# Patient Record
Sex: Female | Born: 1945
Health system: Southern US, Community
[De-identification: ages and names within clinical notes are randomized; demographics above are authoritative.]

## PROBLEM LIST (undated history)

## (undated) DIAGNOSIS — I82409 Acute embolism and thrombosis of unspecified deep veins of unspecified lower extremity: Secondary | ICD-10-CM

## (undated) DIAGNOSIS — R87619 Unspecified abnormal cytological findings in specimens from cervix uteri: Secondary | ICD-10-CM

## (undated) DIAGNOSIS — I739 Peripheral vascular disease, unspecified: Secondary | ICD-10-CM

## (undated) DIAGNOSIS — E785 Hyperlipidemia, unspecified: Secondary | ICD-10-CM

## (undated) DIAGNOSIS — I2510 Atherosclerotic heart disease of native coronary artery without angina pectoris: Secondary | ICD-10-CM

## (undated) DIAGNOSIS — K219 Gastro-esophageal reflux disease without esophagitis: Secondary | ICD-10-CM

## (undated) DIAGNOSIS — D351 Benign neoplasm of parathyroid gland: Secondary | ICD-10-CM

## (undated) DIAGNOSIS — I10 Essential (primary) hypertension: Secondary | ICD-10-CM

## (undated) DIAGNOSIS — H3530 Unspecified macular degeneration: Secondary | ICD-10-CM

## (undated) DIAGNOSIS — M51369 Other intervertebral disc degeneration, lumbar region without mention of lumbar back pain or lower extremity pain: Secondary | ICD-10-CM

## (undated) DIAGNOSIS — M5136 Other intervertebral disc degeneration, lumbar region: Secondary | ICD-10-CM

## (undated) HISTORY — DX: Atherosclerotic heart disease of native coronary artery without angina pectoris: I25.10

## (undated) HISTORY — DX: Benign neoplasm of parathyroid gland: D35.1

## (undated) HISTORY — DX: Other intervertebral disc degeneration, lumbar region without mention of lumbar back pain or lower extremity pain: M51.369

## (undated) HISTORY — DX: Unspecified abnormal cytological findings in specimens from cervix uteri: R87.619

## (undated) HISTORY — PX: CATARACT EXTRACTION: SUR2

## (undated) HISTORY — DX: Unspecified macular degeneration: H35.30

## (undated) HISTORY — PX: FOOT SURGERY: SHX648

## (undated) HISTORY — PX: COLPOSCOPY: SHX161

## (undated) HISTORY — PX: COLONOSCOPY: SHX174

## (undated) HISTORY — DX: Other intervertebral disc degeneration, lumbar region: M51.36

## (undated) HISTORY — DX: Essential (primary) hypertension: I10

## (undated) HISTORY — PX: PARATHYROIDECTOMY: SHX19

## (undated) HISTORY — PX: TUBAL LIGATION: SHX77

## (undated) HISTORY — DX: Hyperlipidemia, unspecified: E78.5

## (undated) HISTORY — DX: Peripheral vascular disease, unspecified: I73.9

---

## 2006-12-03 ENCOUNTER — Ambulatory Visit: Payer: Self-pay | Admitting: Podiatry

## 2006-12-15 ENCOUNTER — Ambulatory Visit: Payer: Self-pay | Admitting: Podiatry

## 2008-11-12 ENCOUNTER — Encounter: Admission: RE | Admit: 2008-11-12 | Discharge: 2008-11-12 | Payer: Self-pay | Admitting: Orthopedic Surgery

## 2008-11-26 ENCOUNTER — Encounter: Admission: RE | Admit: 2008-11-26 | Discharge: 2008-11-26 | Payer: Self-pay | Admitting: Orthopedic Surgery

## 2008-12-23 ENCOUNTER — Emergency Department: Payer: Self-pay | Admitting: Internal Medicine
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
## 2009-04-20 DIAGNOSIS — I219 Acute myocardial infarction, unspecified: Secondary | ICD-10-CM

## 2009-04-20 DIAGNOSIS — I2510 Atherosclerotic heart disease of native coronary artery without angina pectoris: Secondary | ICD-10-CM

## 2009-04-20 HISTORY — DX: Acute myocardial infarction, unspecified: I21.9

## 2009-04-20 HISTORY — DX: Atherosclerotic heart disease of native coronary artery without angina pectoris: I25.10

## 2009-04-20 HISTORY — PX: ILIO-FEMORAL BYPASS GRAFT: SHX989

## 2009-07-15 ENCOUNTER — Encounter: Payer: Self-pay | Admitting: Cardiovascular Disease

## 2009-07-22 ENCOUNTER — Encounter: Payer: Self-pay | Admitting: Cardiovascular Disease

## 2009-07-26 ENCOUNTER — Encounter: Payer: Self-pay | Admitting: Cardiovascular Disease

## 2009-08-22 ENCOUNTER — Encounter: Payer: Self-pay | Admitting: Internal Medicine

## 2009-08-26 ENCOUNTER — Encounter: Payer: Self-pay | Admitting: Cardiovascular Disease

## 2009-09-18 ENCOUNTER — Encounter: Payer: Self-pay | Admitting: Internal Medicine

## 2009-09-30 ENCOUNTER — Telehealth: Payer: Self-pay | Admitting: Cardiovascular Disease

## 2009-09-30 ENCOUNTER — Encounter: Payer: Self-pay | Admitting: Cardiovascular Disease

## 2009-10-03 ENCOUNTER — Emergency Department: Payer: Self-pay | Admitting: Emergency Medicine

## 2009-10-03 IMAGING — CR DG CHEST 1V PORT
1 series · 1 of 1 positions shown · non-contrast
Comparison: none

REASON FOR EXAM: chest pain
COMMENTS:

PROCEDURE:     DXR - DXR PORTABLE CHEST SINGLE VIEW  - [DATE]  [DATE]
RESULT:     Comparison is made to the study [DATE].
The lungs are well-expanded. The cardiac silhouette is top normal in size.
The pulmonary vascularity is not engorged. I see no pleural effusion or
pneumothorax.

[view not recorded]
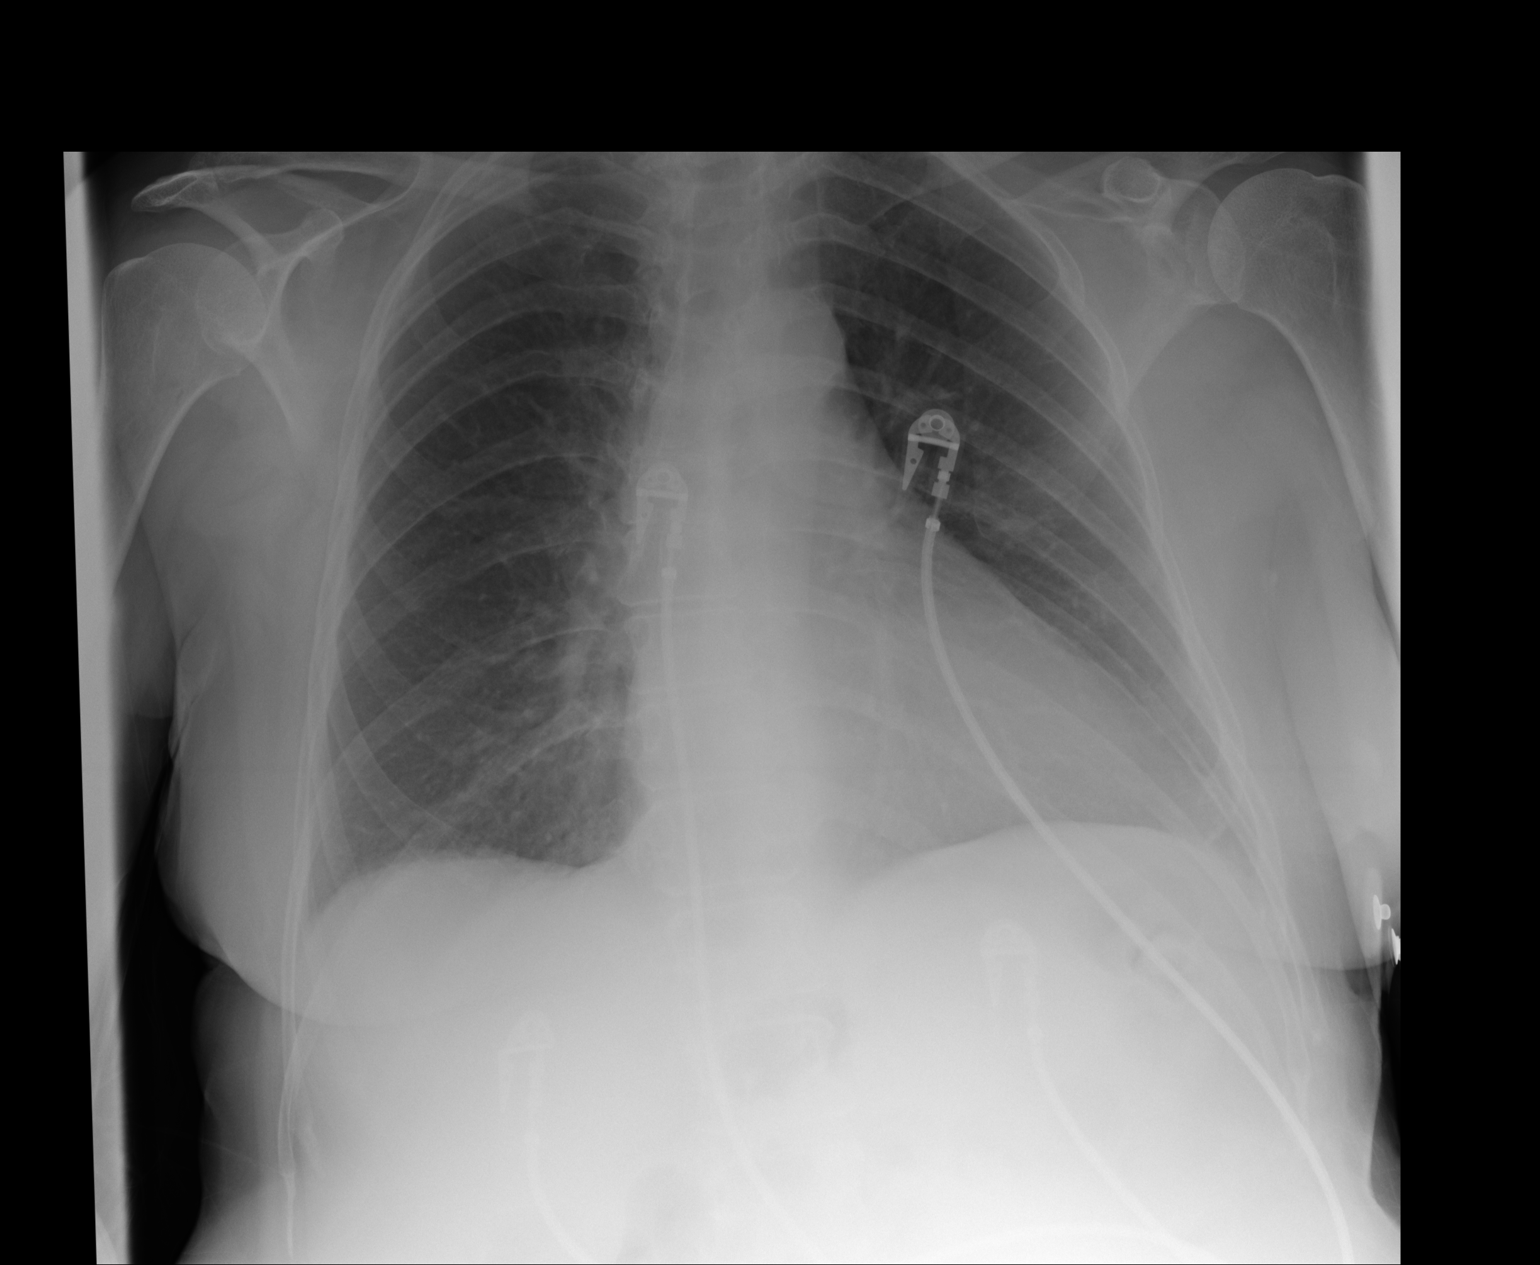

[1 of 1 positions shown; findings below may reference images not displayed]

IMPRESSION: There are findings consistent with COPD or reactive airway
disease. I do not see evidence of CHF nor of pneumonia.

## 2009-10-03 IMAGING — US ABDOMEN ULTRASOUND
1 series · 17 of 25 positions shown · non-contrast
Comparison: none

REASON FOR EXAM: mid epigastic pain
COMMENTS:

[Series 1: abdomen ultrasound · 17 of 77 slices shown]
[im 1/77]
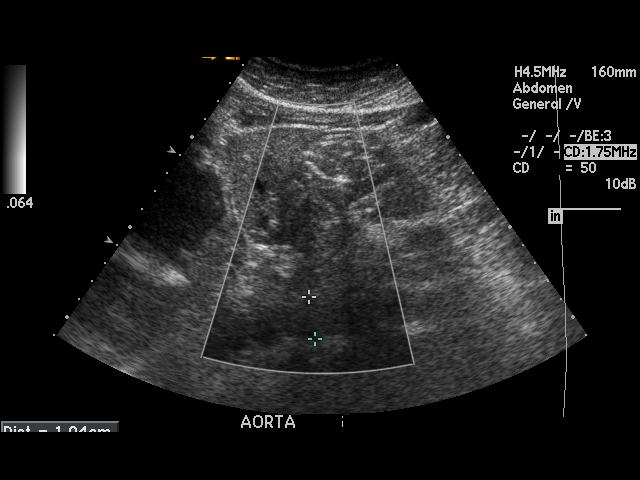
[im 7/77]
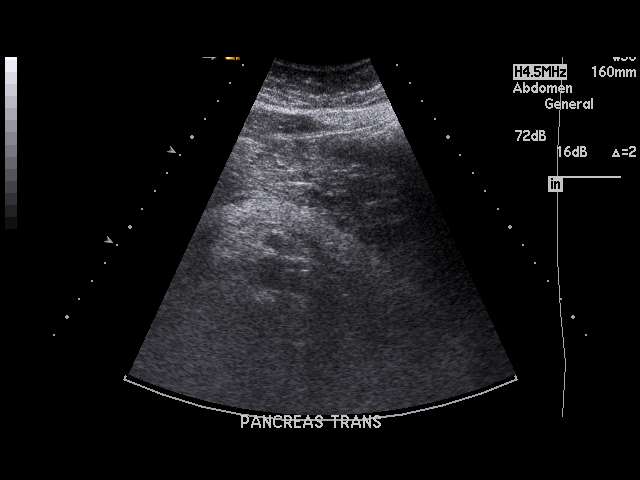
[im 10/77]
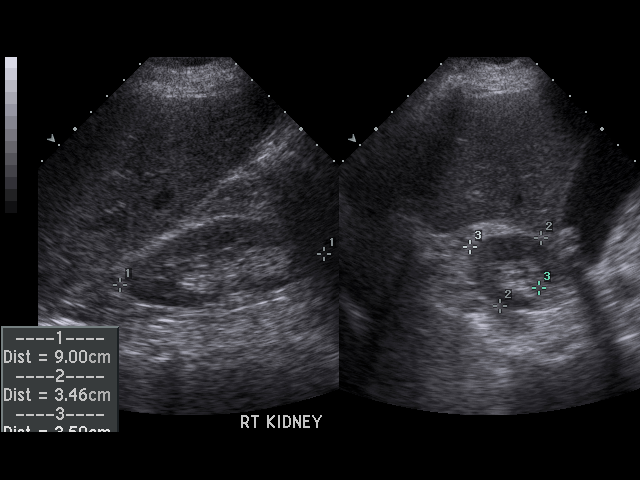
[im 16/77]
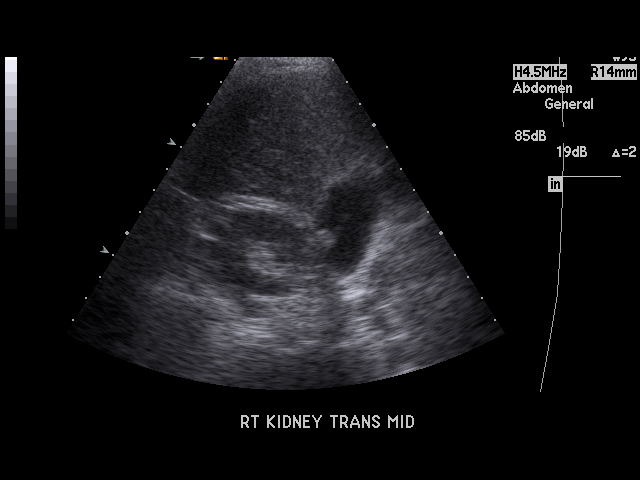
[im 20/77]
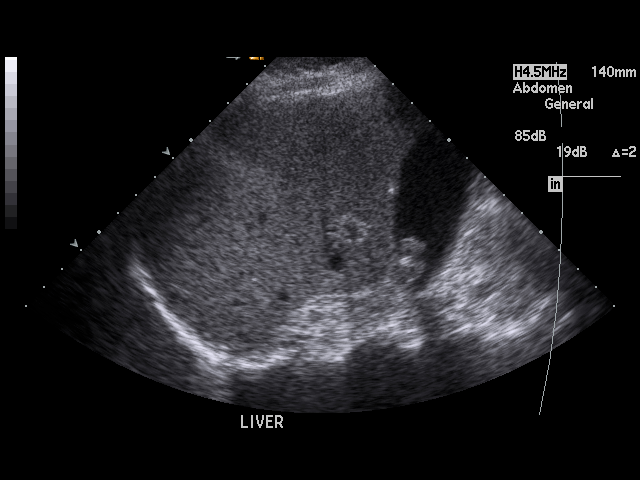
[im 26/77]
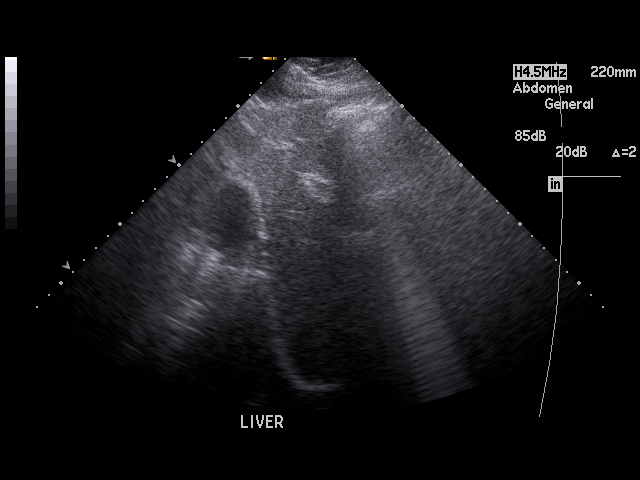
[im 29/77]
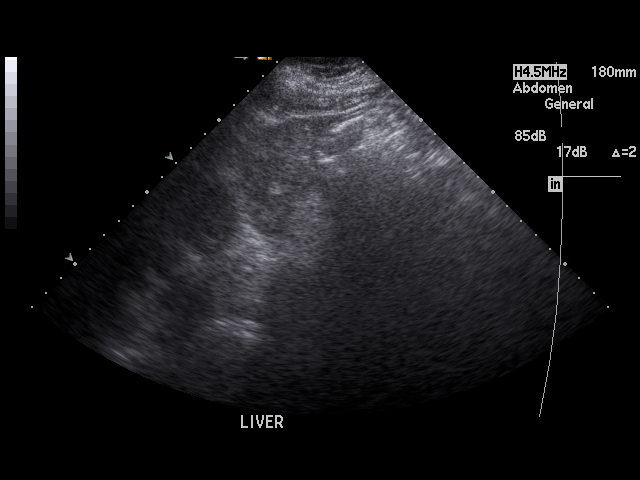
[im 35/77]
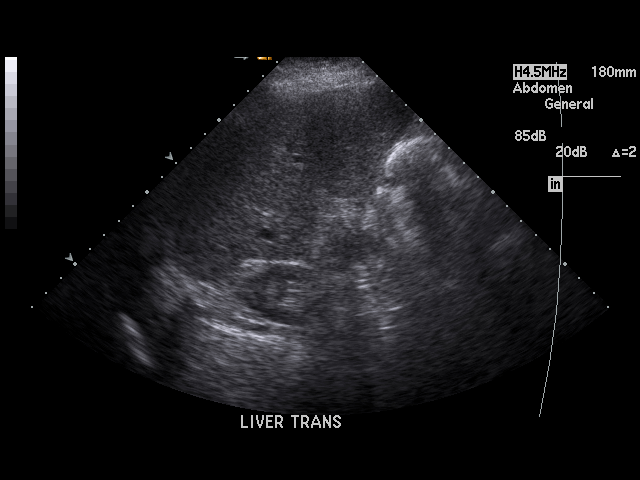
[im 39/77]
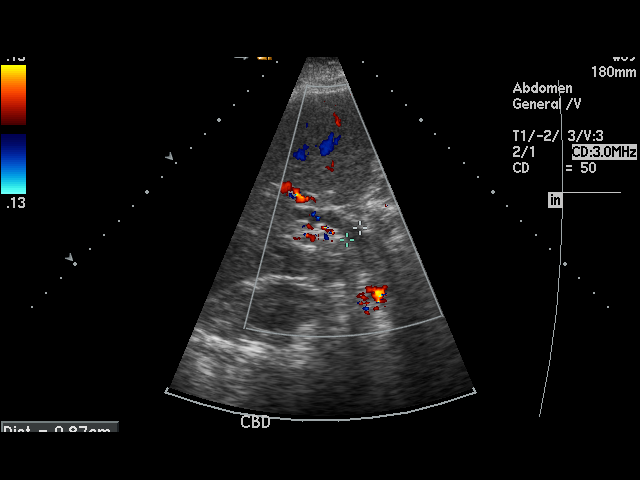
[im 42/77]
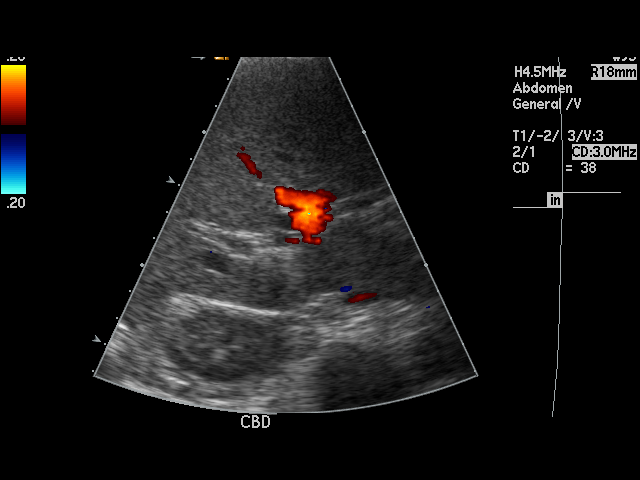
[im 48/77]
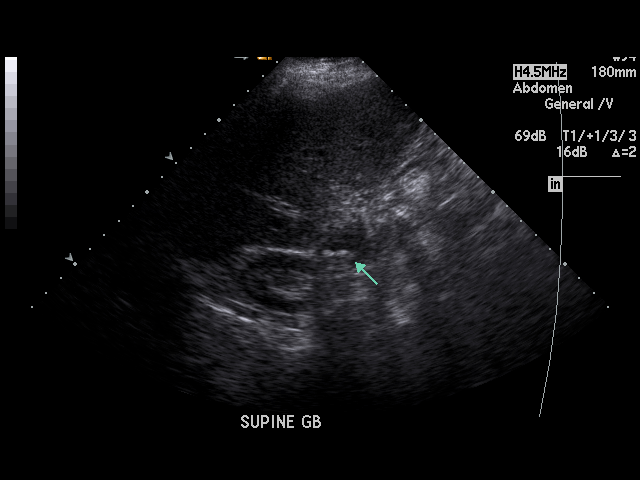
[im 51/77]
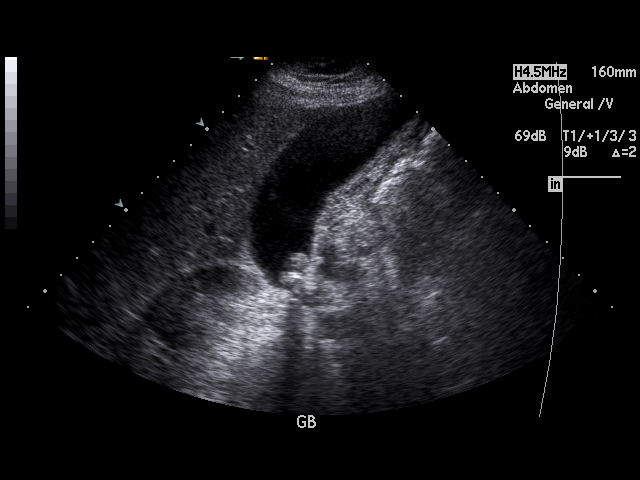
[im 58/77]
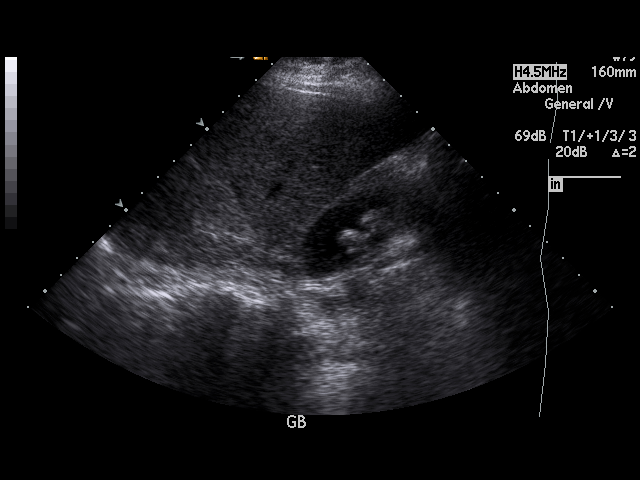
[im 61/77]
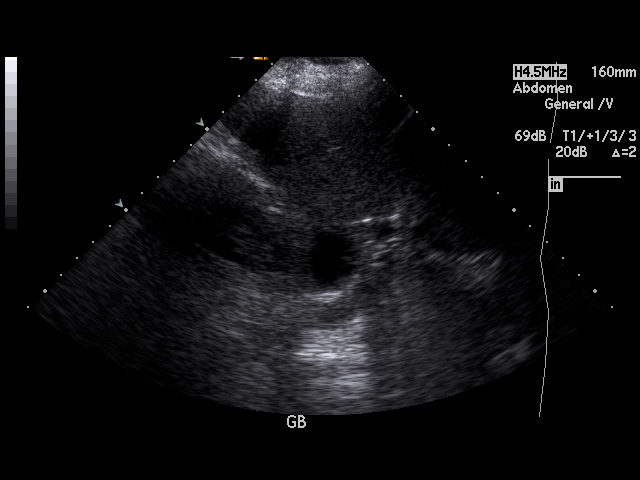
[im 67/77]
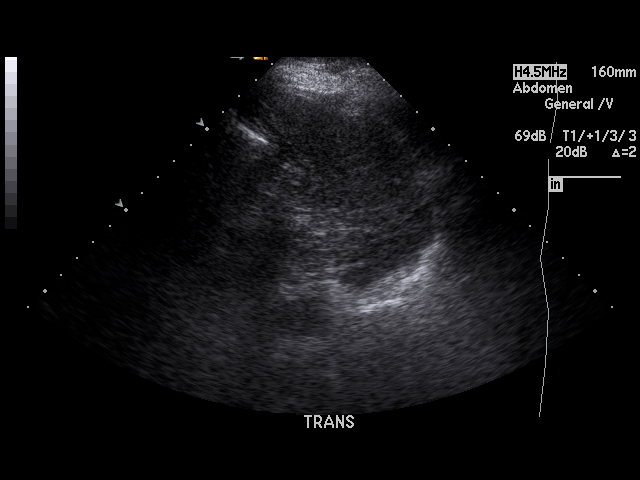
[im 70/77]
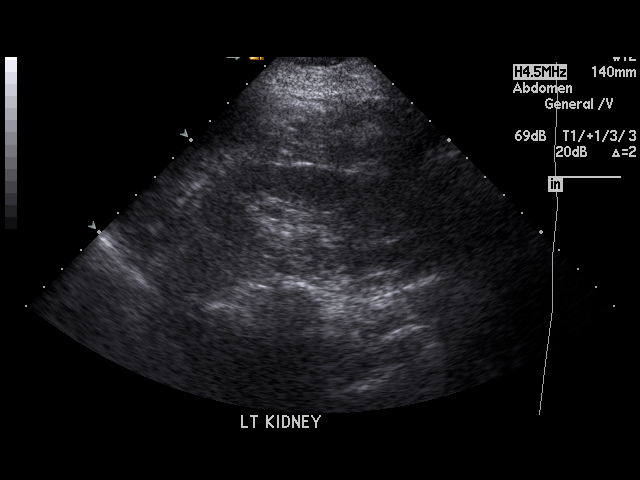
[im 77/77]
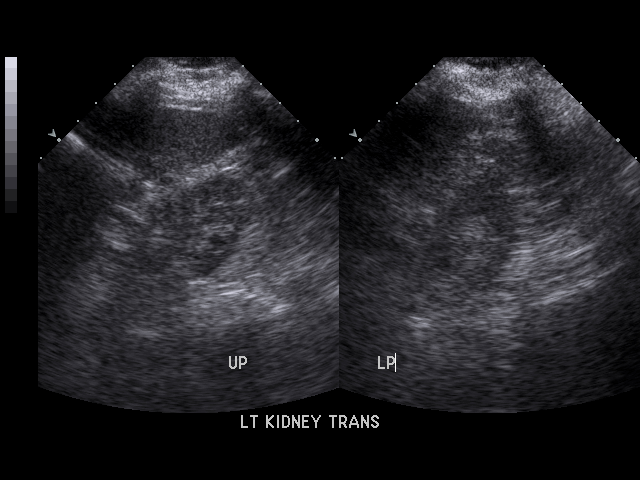

[17 of 25 positions shown; findings below may reference images not displayed]

PROCEDURE:     US  - US ABDOMEN GENERAL SURVEY  - [DATE]  [DATE]

RESULT:     Ultrasound of the abdomen is performed in the standard fashion.
The patient has no previous exam for comparison.

The images of the abdominal aorta shows atherosclerotic irregularity without
aneurysm. The visualized pancreas appears normal. The right kidney is normal
in size and echotexture. The liver shows no discrete mass. The hepatic
echotexture appears normal. The portal venous flow is normal. The common
bile duct diameter measures up to 8.7 mm which is distended. Some images
suggest mild intrahepatic ductal dilation. Cholelithiasis is present. The
stones appear mobile with changes in patient position. No sonographic
Murphy's sign is present. The gallbladder wall thickness is 1.8 mm. The
spleen and left kidney appear unremarkable.
IMPRESSION: Cholelithiasis. Common bile duct dilation. There is mild
intrahepatic ductal dilation. Correlate with LFTs for obstructive pattern.

## 2009-10-18 ENCOUNTER — Encounter: Payer: Self-pay | Admitting: Internal Medicine

## 2009-11-25 ENCOUNTER — Telehealth: Payer: Self-pay | Admitting: Internal Medicine

## 2009-11-26 ENCOUNTER — Encounter: Payer: Self-pay | Admitting: Cardiovascular Disease

## 2010-01-18 HISTORY — PX: MITRAL VALVE REPAIR: SHX2039

## 2010-02-03 ENCOUNTER — Encounter: Payer: Self-pay | Admitting: Cardiovascular Disease

## 2010-02-07 ENCOUNTER — Encounter: Payer: Self-pay | Admitting: Cardiovascular Disease

## 2010-02-07 HISTORY — PX: CORONARY ARTERY BYPASS GRAFT: SHX141

## 2010-02-13 ENCOUNTER — Encounter: Payer: Self-pay | Admitting: Cardiovascular Disease

## 2010-02-17 ENCOUNTER — Encounter: Payer: Self-pay | Admitting: Cardiovascular Disease

## 2010-02-26 ENCOUNTER — Encounter: Payer: Self-pay | Admitting: Cardiovascular Disease

## 2010-03-26 ENCOUNTER — Ambulatory Visit: Payer: Self-pay | Admitting: Cardiovascular Disease

## 2010-03-26 ENCOUNTER — Encounter: Payer: Self-pay | Admitting: Cardiovascular Disease

## 2010-03-26 DIAGNOSIS — I658 Occlusion and stenosis of other precerebral arteries: Secondary | ICD-10-CM | POA: Insufficient documentation

## 2010-03-26 DIAGNOSIS — I25810 Atherosclerosis of coronary artery bypass graft(s) without angina pectoris: Secondary | ICD-10-CM | POA: Insufficient documentation

## 2010-03-26 DIAGNOSIS — Z87891 Personal history of nicotine dependence: Secondary | ICD-10-CM | POA: Insufficient documentation

## 2010-03-26 DIAGNOSIS — E785 Hyperlipidemia, unspecified: Secondary | ICD-10-CM | POA: Insufficient documentation

## 2010-03-27 ENCOUNTER — Encounter: Payer: Self-pay | Admitting: Cardiovascular Disease

## 2010-03-31 ENCOUNTER — Telehealth: Payer: Self-pay | Admitting: Cardiovascular Disease

## 2010-04-01 ENCOUNTER — Encounter: Payer: Self-pay | Admitting: Cardiovascular Disease

## 2010-04-03 ENCOUNTER — Telehealth: Payer: Self-pay | Admitting: Cardiovascular Disease

## 2010-04-04 ENCOUNTER — Ambulatory Visit: Payer: Self-pay | Admitting: Cardiovascular Disease

## 2010-05-01 ENCOUNTER — Encounter: Payer: Self-pay | Admitting: Cardiovascular Disease

## 2010-05-20 NOTE — Assessment & Plan Note (Signed)
Summary: NP6/AMD   Visit Type:  Initial Consult Primary Abhiraj Dozal:  Dr. Ronna Polio  CC:  Establish care. Denies chest pain, SOB, and or palpitations.  History of Present Illness: [name] is a pleasant 65 year old woman with a history of coronary artery disease, stent to her left circumflex and 2 stents to the LAD in March of 2011, bypass grafting of her left femoral by her report in early October with occlusion of her stents in mid-October requiring bypass surgery at [facility] in mid to late October. She presents to establish care.  Overall, she reports that she is doing well currently she has a long history of smoking though stopped in March of this year. She was initially on Lipitor after her stents were placed this was changed to simvastatin after one week when it was felt she might have myalgias. Her myalgias/shoulder pain was in hindsight likely due to her gallbladder for her report. She denies any significant shortness of breath since her bypass surgery. She does have a bulging/knot around the left inguinal area at the site of her lower extremity bypass. This was done by Dr. Sedalia Muta. We do not have his records with [place] today.  She has not started physical therapy following her bypass surgery. She is active no other complaints.  Notes indicate that she did have ventricular arrhythmia when her stents were placed in the Cath Lab requiring shock. She was started on an ACE inhibitor after being discharged from the hospital in March. She is uncertain why this was not restarted after her recent bypass surgery.  Notes indicate significant ischemic heart March be back in March she reports her ejection fraction has improved on subsequent echocardiograms to greater than 40%.  Cardiac catheter report from March 28 indicate she had a vision bare metal stent 2.75 x 28 mm in the LCX, mini vision bare metal stent 2.5 x 28 and 2.5 x 15 mm stent to the LAD.  EKG today shows no sinus rhythm with rate 82 beats per  minute, nonspecific ST changes in leads V5, V6. Unable to exclude anteroseptal infarct. Left axis deviation  Preventive Screening-Counseling & Management  Alcohol-Tobacco     Smoking Status: quit  Caffeine-Diet-Exercise     Does Patient Exercise: yes      Drug Use:  no.    Current Medications (verified): 1)  Alprazolam 0.5 Mg Tabs (Alprazolam) .... Er 1 Tab By Mouth Daily As Needed 2)  Aspirin 81 Mg Tbec (Aspirin) .Marland Kitchen.. 1 Tablet Daily 3)  Vitamin B-12 5000  (Cyanocobalamin) .Marland Kitchen.. 1 Tablet Once A Week 4)  Fish Oil/docosahexanoic Acid/eicosapentaenoic Acid Oral Caps .Marland KitchenMarland Kitchen. 1200-144mg  1 Capsule By Mouth Daily 5)  Hydrocodone-Acetaminophen 5-500 Mg Tabs (Hydrocodone-Acetaminophen) .... 3-4 Tabs By Mouth Per Day As Needed  (Per Back Pain) 6)  Metoprolol Succinate 25 Mg Xr24h-Tab (Metoprolol Succinate) .... 1/2 Tablet in Morning and 1/2 Tablet At Night 7)  Simvastatin 40 Mg Tabs (Simvastatin) .Marland Kitchen.. 1 Tab By Mouth At Bedtime 8)  Gabapentin 100 Mg Caps (Gabapentin) .Marland Kitchen.. 1 Tablet Three Times A Day 9)  Protonix 40 Mg Tbec (Pantoprazole Sodium) .Marland Kitchen.. 1 Tablet Once Daily  Allergies (verified): No Known Drug Allergies  Past History:  Family History: Last updated: 04-13-2010 Father:deceased-Heart Disease Mother:deceased-old age  Social History: Last updated: 04-13-2010 Full Time Single  Tobacco Use - Former-quit in 06/2009 Alcohol Use - yes-occasional Regular Exercise - yes-stationary bike Drug Use - no  Risk Factors: Exercise: yes (April 13, 2010)  Risk Factors: Smoking Status: quit (2010-04-13)  Past Medical  History: hypertension Dyslipidemia MI x 2  Past Surgical History: foot surgery, right foot- 4 years ago open heart surgery-02/07/2010  Family History: Father:deceased-Heart Disease Mother:deceased-old age  Social History: Full Time Single  Tobacco Use - Former-quit in 06/2009 Alcohol Use - yes-occasional Regular Exercise - yes-stationary bike Drug Use - no Smoking  Status:  quit Does Patient Exercise:  yes Drug Use:  no  Review of Systems  The patient denies fever, weight loss, weight gain, vision loss, decreased hearing, hoarseness, chest pain, syncope, dyspnea on exertion, peripheral edema, prolonged cough, abdominal pain, incontinence, muscle weakness, depression, and enlarged lymph nodes.         Knot in her left groin  Vital Signs:  Patient profile:   65 year old female Height:      62 inches Weight:      117.25 pounds BMI:     21.52 Pulse rate:   82 / minute BP sitting:   108 / 68  (left arm) Cuff size:   regular  Vitals Entered By: Lysbeth Galas CMA (March 26, 2010 10:50 AM)  Physical Exam  General:  Well developed, well nourished, in no acute distress. Head:  normocephalic and atraumatic Neck:  Neck supple, no JVD. No masses, thyromegaly or abnormal cervical nodes. Lungs:  Clear bilaterally to auscultation and percussion. Heart:  Non-displaced PMI, chest non-tender; regular rate and rhythm, S1, S2 without murmurs, rubs or gallops. Carotid upstroke normal, no bruit. Normal abdominal aortic size, no bruits.  Pedals normal pulses. No edema, no varicosities. she does have a nonpulsatile region that is raised, the size of a large grape or small lime at the site of her previous incision for lower extremity bypass is nontender. Abdomen:  Bowel sounds positive; abdomen soft and non-tender without masses Msk:  Back normal, normal gait. Muscle strength and tone normal. Pulses:  pulses normal in all 4 extremities Extremities:  No clubbing or cyanosis. Neurologic:  Alert and oriented x 3. Skin:  Intact without lesions or rashes. Psych:  Normal affect.   Impression & Recommendations:  Problem # 1:  CAD, AUTOLOGOUS BYPASS GRAFT (ICD-414.02) we have encouraged continued smoking cessation, good cholesterol control. We will try to obtain her most recent cholesterol panel for our records and adjust her medications accordingly.   The following  medications were removed from the medication list:    Plavix 75 Mg Tabs (Clopidogrel bisulfate) .Marland Kitchen... 1 tab by mouth daily    Lisinopril 2.5 Mg Tabs (Lisinopril) .Marland Kitchen... 0.5 tab by mouth daily    Nitrostat 0.4 Mg Subl (Nitroglycerin) .Marland Kitchen... 1 tab under tongue as needed per chest pain Her updated medication list for this problem includes:    Aspirin 81 Mg Tbec (Aspirin) .Marland Kitchen... 1 tablet daily two times a day    Metoprolol Succinate 25 Mg Xr24h-tab (Metoprolol succinate) .Marland Kitchen... 1/2 tablet in morning and 1/2 tablet at night  Problem # 2:  MULTI-VESSEL OR BILATERAL STENOSIS W/O INFARCTION (ICD-433.30) Severe lower extremity arterial disease with recent bypass of the lower extremity on the left. She will need routine ultrasounds on an annual basis either at [facility] or in our office.  The following medications were removed from the medication list:    Plavix 75 Mg Tabs (Clopidogrel bisulfate) .Marland Kitchen... 1 tab by mouth daily Her updated medication list for this problem includes:    Aspirin 81 Mg Tbec (Aspirin) .Marland Kitchen... 1 tablet daily two times a day  Problem # 3:  HYPERLIPIDEMIA-MIXED (ICD-272.4) Goal LDL <70. She will call us with  her numbers from [place].  Her updated medication list for this problem includes:    Simvastatin 40 Mg Tabs (Simvastatin) .Marland Kitchen... 1 tab by mouth at bedtime  Problem # 4:  TOBACCO USE, QUIT (ICD-V15.82) Continue smoking cessation.  Patient Instructions: 1)  Your physician recommends that you schedule a follow-up appointment in: 6 months 2)  Your physician has recommended you make the following change in your medication: Increase Aspirin 81mg  two times a day. 3)  Your physician recommends referral and attendance at a Cardiac Rehab Program.  Appended Document: NP6/AMD lab work from June 2011 shows total cholesterol 215, HDL 81, LDL 109  Normal LFTs  Vascular surgery clinic note indicates extensive external iliac and common femoral arterial occlusive disease, status post left  iliofemoral bypass with ST elevation MI on postop day #4 with bypass grafting  Thoracic surgery clinic note indicates bypass grafting x3 with mitral valve repair on February 07, 2010. [employer] RSR rigid saddle ring, LIMA to the LAD, vein graft to the OM1, vein graft to the PDA  Cardiac catheterization report prior to bypass showing 50% proximal RCA disease, 90% mid circumflex disease with 70% distal circumflex disease, 60% proximal circumflex, 99% mid LAD disease with other serial lesions estimated at 70-80%

## 2010-05-20 NOTE — Progress Notes (Signed)
Summary: PROBLEMS  Phone Note Call from Patient Call back at [phone]   Caller: SELF Call For: [facility] Summary of Call: PAT HAS NEVER SEEN ANYONE HEAR BUT WAS CONSIDERING TRANSFERING HERE FROM [place]-HAD A HEART ATTACK AND 3 STENTS IN MARCH-HAD PAIN IN LEFT SHOULDER BLADE OVER THE WEEKEND-FEELING LIGHT HEADED AND DIZZY NOW WHEN STANDING-PT STATES THAT SHE DID NOT EAT MUCH  WEEKEND-BP TODAY IS 89/66-WAS TOLD BY THE DOCTOR THAT HER BP NEEDS TO BE LOW SO THAT IT DOES NOT HAVE TO WORK HARD Initial call taken by: Harlon Flor,  September 30, 2009 9:29 AM  Follow-up for Phone Call        Spoke to pt she had already called her Dr's office from [facility] and they had advised her to call 911.  Pt awaiting EMS to arrive. Informed pt that to be seen here she would need to set up a new pt appointment, was in agreement.  Follow-up by: Benedict Needy, RN,  September 30, 2009 10:08 AM

## 2010-05-20 NOTE — Letter (Signed)
Summary: Medical Record Release  Medical Record Release   Imported By: Harlon Flor 11/27/2009 10:11:12  _____________________________________________________________________  External Attachment:    Type:   Image     Comment:   External Document

## 2010-05-20 NOTE — Progress Notes (Signed)
Summary: LEG PAIN  Phone Note Call from Patient Call back at Work Phone [phone] Call back at [phone]   Caller: SELF Call For: [facility] Summary of Call: PT IS ON PREDNISONE-RIGHT LEG IS HAVING PAIN-DOES NOT KNOW IF IT IS BC OF THE MEDICATION OR IF IT HAS SOMETHING TO DO WITH CIRCULATION-SHOULD SHE TAKE HER MEDICATION-SHE IS TAKING IT FOR A PINCHED NERVE IN HER BACK Initial call taken by: Harlon Flor,  November 25, 2009 9:12 AM  Follow-up for Phone Call        called and spoke to pt about leg. She reports no temperature or color differance.  Referred her to her PCP. Pt has not been seen in this office we have no record of her meds or history. Faxed her a record release she will send back so we will have her records for her appt with Dr. Gala Romney.  Follow-up by: Benedict Needy, RN,  November 25, 2009 5:21 PM

## 2010-05-21 ENCOUNTER — Encounter: Payer: Self-pay | Admitting: Cardiovascular Disease

## 2010-05-22 NOTE — Letter (Signed)
Summary: [facility]  [facility] - Cardio Thoracic Surgery   Imported By: Marylou Mccoy 04/18/2010 17:01:39  _____________________________________________________________________  External Attachment:    Type:   Image     Comment:   External Document

## 2010-05-22 NOTE — Miscellaneous (Signed)
[facility]   Imported By: Harlon Flor 04/03/2010 13:07:23  _____________________________________________________________________  External Attachment:    Type:   Image     Comment:   External Document

## 2010-05-22 NOTE — Cardiovascular Report (Signed)
Summary: [facility] - Cardiac Cath  [facility] - [facility] Cath   Imported By: Marylou Mccoy 04/18/2010 19:12:45  _____________________________________________________________________  External Attachment:    Type:   Image     Comment:   External Document

## 2010-05-22 NOTE — Cardiovascular Report (Signed)
Summary: [facility] - Cath  [facility] - Cath   Imported By: Marylou Mccoy 04/18/2010 12:00:51  _____________________________________________________________________  External Attachment:    Type:   Image     Comment:   External Document

## 2010-05-22 NOTE — Letter (Signed)
Summary: [facility] - CDU Treadmill  [facility] - CDU [facility]   Imported By: Marylou Mccoy 04/18/2010 13:15:17  _____________________________________________________________________  External Attachment:    Type:   Image     Comment:   External Document

## 2010-05-22 NOTE — Assessment & Plan Note (Signed)
Summary: LE Edema  Nurse Visit  CC: LE Edema Comments Pt c/o BLE non-pitting edema. Pt had by-pass surgery in LLE 01/2010 and is concerned may be related. Pt also has hematoma left groin that she obtained post surgery in October that she wanted me to look at. Upon assessment both legs show mild non pitting edema, left slightly more than right. Explained to pt that this is more than likely due to tissue trauma from surgery. And explained swelling will not be helped from diuretic necessarily, and that this would dry her up. Explained the swelling is from slower venous return and result of her starting new job where she sits 4 hours straight and that compression socks would be the best tx at this time. Pt understands this. Pt states hematoma has not changed in size and she had u/s done on it previously. Instructed pt to monitor site and to call with any signs of it getting larger, change in appearance, redness, etc. Pt will do this. Gave pt script for compression socks.   Allergies: No Known Drug Allergies

## 2010-05-22 NOTE — Letter (Signed)
Summary: [facility] - Echo  [facility] Echo   Imported By: Marylou Mccoy 04/18/2010 13:07:24  _____________________________________________________________________  External Attachment:    Type:   Image     Comment:   External Document

## 2010-05-22 NOTE — Progress Notes (Signed)
Summary: LFT/Lipid  ---- Converted from flag ---- ---- 03/29/2010 12:11 PM, Dossie Arbour MD wrote: She needs to recheck cholesterol, LFTs are. Was elevated in 09/2009 well above goal. ------------------------------  Called pt LMOM TCB MES Phone Note Call from Patient   Caller: Patient Summary of Call: Spoke to pt, notified she will need lipid/lft's drawn. Pt requests we send order to [facility] and will have them send results. Faxed order to [phone] Initial call taken by: Lanny Hurst RN,  March 31, 2010 4:44 PM

## 2010-05-22 NOTE — Letter (Signed)
Summary: [facility] - Thoracic Surgery Note  [facility] - Thoracic Surgery Note   Imported By: Marylou Mccoy 04/18/2010 19:11:21  _____________________________________________________________________  External Attachment:    Type:   Image     Comment:   External Document

## 2010-05-22 NOTE — Progress Notes (Signed)
Summary: BP readings  Phone Note Call from Patient   Caller: Patient Summary of Call: Patient having some swelling in legs mostly around ankles around the elastic of socks.  She has noticed the swelling since went back to work this week.  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  She does not know what blood pressure running but she was taken off Furosemide because of her blood pressure dropping. Denies shortness of breath. She does have a follow up with Dr. Ronna Polio on Monday, April 07, 2010.  She is going home to take BP at lunch and will call us with the reading.    Cell Ph [phone] [address] [phone] Initial call taken by: Bishop Dublin, CMA,  April 03, 2010 11:41 AM  Follow-up for Phone Call        Patient called with BP readings of 137/87 with heart rate 90 and 123/82 and heart rate 87. Follow-up by: Bishop Dublin, CMA,  April 03, 2010 1:12 PM  Additional Follow-up for Phone Call Additional follow up Details #1::        Pt called again stating at 3:00pm BP 124/80 HR 80 Additional Follow-up by: Lanny Hurst RN,  April 03, 2010 3:39 PM    Additional Follow-up for Phone Call Additional follow up Details #2::    Blood pressure number look ok. Would try wearing ted hose or compression hose if sitting for long periods. Monitor swelling.    Appended Document: BP readings Attempted to call pt back LMOM TCB /MES  Appended Document: BP readings Spoke to pt, notified BP readings normal per Dr. Mariah Milling. Pt will come by office for rx for ted hose. Pt is concerned about swelling due to hx of blockage in LE. Pt will come by office for nurse visit for [place] to look at her legs.

## 2010-05-22 NOTE — Letter (Signed)
Summary: [facility] - Vascular Surgery Clinic Note  [facility] - [facility]   Imported By: Marylou Mccoy 04/18/2010 19:10:42  _____________________________________________________________________  External Attachment:    Type:   Image     Comment:   External Document

## 2010-05-22 NOTE — Letter (Signed)
Summary: [facility] - Office Visit  [facility] - Office Visit   Imported By: Marylou Mccoy 04/18/2010 13:06:47  _____________________________________________________________________  External Attachment:    Type:   Image     Comment:   External Document

## 2010-05-22 NOTE — Letter (Signed)
Summary: [name] - Discharge Summary  [facility] - Discharge Summary   Imported By: Marylou Mccoy 04/18/2010 13:22:40  _____________________________________________________________________  External Attachment:    Type:   Image     Comment:   External Document

## 2010-05-22 NOTE — Letter (Signed)
Summary: [facility] - Duplex Lower Extremity  [facility] - Duplex Lower Extremity   Imported By: Marylou Mccoy 04/18/2010 19:10:02  _____________________________________________________________________  External Attachment:    Type:   Image     Comment:   External Document

## 2010-06-19 ENCOUNTER — Encounter: Payer: Self-pay | Admitting: Cardiovascular Disease

## 2010-06-26 NOTE — Miscellaneous (Signed)
Summary: [facility] Cardiac Plan of Care   [facility] Cardiac Plan of Care   Imported By: Roderic Ovens 06/16/2010 11:52:15  _____________________________________________________________________  External Attachment:    Type:   Image     Comment:   External Document

## 2010-07-30 ENCOUNTER — Encounter: Payer: Self-pay | Admitting: Cardiovascular Disease

## 2010-07-30 ENCOUNTER — Ambulatory Visit (INDEPENDENT_AMBULATORY_CARE_PROVIDER_SITE_OTHER): Payer: PRIVATE HEALTH INSURANCE | Admitting: Cardiovascular Disease

## 2010-07-30 DIAGNOSIS — I739 Peripheral vascular disease, unspecified: Secondary | ICD-10-CM

## 2010-07-30 DIAGNOSIS — I658 Occlusion and stenosis of other precerebral arteries: Secondary | ICD-10-CM

## 2010-07-30 DIAGNOSIS — I25810 Atherosclerosis of coronary artery bypass graft(s) without angina pectoris: Secondary | ICD-10-CM

## 2010-07-30 DIAGNOSIS — E785 Hyperlipidemia, unspecified: Secondary | ICD-10-CM

## 2010-07-30 NOTE — Assessment & Plan Note (Signed)
Left lower extremity bypass grafting, left iliofemoral bypass with recent ultrasound showing this is patent. She does have moderate disease on the right. Recommend continued aggressive medical management.

## 2010-07-30 NOTE — Assessment & Plan Note (Signed)
Currently with no symptoms of chest discomfort. She has recovered well from bypass surgery. We'll continue aggressive medical management.

## 2010-07-30 NOTE — Progress Notes (Signed)
   Patient ID: [name], female    DOB: Dec 04, 1945, 65 y.o.   MRN: [id number]  HPI Comments: [name] is a pleasant 65 year old woman with a history of CAD, stent to her left circumflex and 2 stents to the LAD in March of 2011, bypass grafting of her left femoral by her report in early October with occlusion of her stents in mid-October requiring bypass surgery at [facility] in mid to late October. She presents 4 routine followup.  She reports that since her last visit, her weight has increased at least 10 pounds. Previously in December, her total cholesterol was in the 160 range. She is not walking very far because of chronic back pain. She denies any chest pain or worsening shortness of breath. She does not exercise on a regular basis..  Most recent ultrasound of her left groin March of this year showing patent graft with no significant stenoses. Right ABI indicating moderate arterial disease at rest with monophasic waveform, biphasic waveform on the left.  Cardiac catheter report from March 28 indicate she had a vision bare metal stent 2.75 x 28 mm in the LCX, mini vision bare metal stent 2.5 x 28 and 2.5 x 15 mm stent to the LAD.   EKG today shows no sinus rhythm with rate 85 beats per minute, nonspecific ST changes in leads V5, V6. Unable to exclude anteroseptal infarct. Left axis deviation      Review of Systems  Constitutional: Positive for unexpected weight change.  HENT: Negative.   Eyes: Negative.   Respiratory: Negative.   Cardiovascular: Negative.   Gastrointestinal: Negative.   Musculoskeletal: Positive for back pain.  Skin: Negative.   Neurological: Negative.   Hematological: Negative.   Psychiatric/Behavioral: Negative.   All other systems reviewed and are negative.   BP 140/80  Pulse 87  Ht 5\' 2"  (1.575 m)  Wt 129 lb (58.514 kg)  BMI 23.59 kg/m2   Physical Exam  Nursing note and vitals reviewed. Constitutional: She is oriented to person, place, and time. She  appears well-developed and well-nourished.  HENT:  Head: Normocephalic.  Nose: Nose normal.  Mouth/Throat: Oropharynx is clear and moist.  Eyes: Conjunctivae are normal.  Neck: Normal range of motion. Neck supple. No JVD present.  Cardiovascular: Normal rate, regular rhythm, normal heart sounds and intact distal pulses.  Exam reveals no gallop and no friction rub.   No murmur heard. Pulmonary/Chest: Effort normal and breath sounds normal. No respiratory distress. She has no wheezes. She has no rales. She exhibits no tenderness.  Abdominal: Soft. Bowel sounds are normal. She exhibits no distension. There is no tenderness.  Musculoskeletal: Normal range of motion. She exhibits no edema and no tenderness.  Lymphadenopathy:    She has no cervical adenopathy.  Neurological: She is alert and oriented to person, place, and time. Coordination normal.  Skin: Skin is warm and dry. No rash noted. No erythema.  Psychiatric: She has a normal mood and affect. Her behavior is normal. Judgment and thought content normal.         Assessment and Plan

## 2010-07-30 NOTE — Assessment & Plan Note (Signed)
Goal LDL less than 70. We have suggested she check her labs now as her weight is up more than 10 pounds.

## 2010-07-30 NOTE — Patient Instructions (Addendum)
You are doing well. No medication changes were made. Monitor your heart rate and call our office with any changes. Please call us if you have new issues that need to be addressed before your next appt.  Your physician recommends that you have labs done at [facility]: Lipid, Lft, BMP, TSH We will call you for a follow up Appt.

## 2010-08-06 ENCOUNTER — Other Ambulatory Visit: Payer: Self-pay | Admitting: Cardiovascular Disease

## 2010-08-06 ENCOUNTER — Telehealth: Payer: Self-pay | Admitting: *Deleted

## 2010-08-06 ENCOUNTER — Encounter: Payer: Self-pay | Admitting: Cardiovascular Disease

## 2010-08-06 NOTE — Telephone Encounter (Signed)
I called pt about lab results just received from [facility], K was elevated @ 5.3. Pt did state that she had been eating a lot of "bananas and banana pudding lately, and possibly other sources of PO potassium intake." Advised pt to try and limit these in her diet and keep in moderation. Pt also notified her overall cholesterol was 219, LDL 84. Pt is still on Simva 40mg . Pt states she has not been eating well at all lateley and would like to try to improve her diet to bring these numbers down. I saw her Creat was 1.02, do not have previous result to compare. Would you like pt to return for repeat BMP after diet change. Will call pt with any other recommendations. Pt does have f/u with you 01/2011.

## 2010-08-06 NOTE — Telephone Encounter (Signed)
Cholesterol way to high for diet alone. She needs cholesterol level of <150 as she has disease. LDL <70. Diet will not do it. Would change to crestor 40 mg daily. Is she taking simva every day?

## 2010-08-07 ENCOUNTER — Other Ambulatory Visit: Payer: Self-pay | Admitting: Cardiovascular Disease

## 2010-08-07 MED ORDER — ROSUVASTATIN CALCIUM 40 MG PO TABS: 40.0000 mg | ORAL_TABLET | Freq: Every day | ORAL | Status: DC

## 2010-08-07 NOTE — Telephone Encounter (Signed)
Notified pt, she will change to Crestor 40mg  daily, (she was taking simva daily). Sent rx to pharmacy and left samples at front for pick up. Does pt need to have labs drawn prior to visit for BMET and/or lipid?

## 2010-08-07 NOTE — Telephone Encounter (Signed)
Cholesterol in three months

## 2010-08-08 NOTE — Telephone Encounter (Signed)
Spoke to pt, she states depending on her monthly schedule she will need to call us back to make appt. Recall appt has been put in for 3 mo lipid/lft [place]

## 2010-08-20 ENCOUNTER — Encounter: Payer: Self-pay | Admitting: Cardiovascular Disease

## 2010-09-02 ENCOUNTER — Telehealth: Payer: Self-pay | Admitting: *Deleted

## 2010-09-02 NOTE — Telephone Encounter (Signed)
Pt called stating that her PCP wanted to put her back on a medication for osteoporosis, she was on Evista in the past and has been off of for about a year (she stopped after her recent MI). Dr. Ronna Polio told her she may want to have her cardiologist approve her restarting Evista, as it may cause complications. Pt does state she had DVT when she was 26 after taking birth control pills. Pt states that if Evista is not safe, PCP recommended she try Prolia, a twice a year injection for osteop. Told pt I would discuss with Dr. Mariah Milling and call her back.

## 2010-09-06 NOTE — Telephone Encounter (Signed)
Found this on Evista:   INCREASED RISK OF VENOUS THROMBOEMBOLISM AND DEATH FROM STROKE Increased risk of deep vein thrombosis and pulmonary embolism have been reported with EVISTA (see PRECAUTIONS). Women with active or past history of venous thromboembolism should not take EVISTA   Increased risk of death due to stroke occurred in a trial in postmenopausal women with documented coronary heart disease or at increased risk for major coronary events. Consider risk-benefit balance in women at risk for stroke   Because she has CAD and previous DVT, would not recommend evista for her. Sorry for delay

## 2010-09-08 NOTE — Telephone Encounter (Signed)
Attempted to contact pt, LMOM TCB.  

## 2010-09-09 NOTE — Telephone Encounter (Signed)
Pt notified of below, pt is asking if the Prolia would be safe for her. This is a twice a year injection and she read that it could cause inflammation in lining of heart. Please advise?

## 2010-09-10 ENCOUNTER — Encounter: Payer: Self-pay | Admitting: Cardiovascular Disease

## 2010-09-15 NOTE — Telephone Encounter (Signed)
Not really seeing any problems on prolia. Should be ok from cardiac perspective based on website data.

## 2010-09-16 NOTE — Telephone Encounter (Signed)
Attempted to contact pt, LMOM TCB.  

## 2010-09-17 ENCOUNTER — Encounter: Payer: Self-pay | Admitting: Cardiovascular Disease

## 2010-09-17 NOTE — Telephone Encounter (Signed)
Attempted to contact pt, LMOM TCB.  

## 2010-09-18 NOTE — Telephone Encounter (Signed)
Spoke to pt, notified of below. She also would like to know if that fact that her PTH is elevated r/t her elev Ca of 10.6 can affect her from cardiac standpoint. Please advise.

## 2010-09-18 NOTE — Telephone Encounter (Signed)
Should not cause any cardiac issues

## 2010-09-19 NOTE — Telephone Encounter (Signed)
Pt.notified

## 2010-11-05 ENCOUNTER — Telehealth: Payer: Self-pay | Admitting: *Deleted

## 2010-11-05 NOTE — Telephone Encounter (Signed)
Spoke to pt, regarding BP and chol results pt brought to office. Per Dr. Mariah Milling pt notified chol looks great and her BP is a little low at 90/50. She says this was taken at rehab and she thought she could not drink prior to, so this may be why. She is going to incr fluids and salt (has been low in past) and will monitor BP. She will call with any problems.

## 2010-11-13 ENCOUNTER — Telehealth: Payer: Self-pay | Admitting: *Deleted

## 2010-11-25 ENCOUNTER — Telehealth: Payer: Self-pay | Admitting: *Deleted

## 2010-11-25 NOTE — Telephone Encounter (Signed)
Would continue on crestor 40 mg daily. Her choice. Simva 40 did not control cholesterol well enough in 07/2010

## 2010-11-25 NOTE — Telephone Encounter (Signed)
Below is msg from pt's lab results from Dr. Tilman Neat office. Pt is calling again today wanting to know why she has not gotten a response. She has now been off of cholesterol medication for 2 weeks and is concerned with her hx. I have offered pt that we can give her crestor samples until she gets new rx if that is recc, and she does have 2 crestor tablets left, she does not want to take but she will if needed- due to elevated LFTs. Pt knows these were only slightly elevated, but still wants to restart simva, also it will be much cheaper for her. Told pt will wait for Dr. Windell Hummingbird recc and will call back.   Pt is asking if she can go ahead and get her prescription, Dr. Mariah Milling can pt switch back to simva?? ----- Message ----- From: Mal Amabile, RN Sent: 11/20/2010 2:41 PM To: Antonieta Iba, MD Subject: Annell Greening: LFTS   PT notified. She is going to have repeat lft's at Dr. Sonny Dandy office at the end of this month and they will send [place] results. Pt is asking if she can still go back on simvastatin, she really does not want to take Crestor. She says she feels when chol high in past she was eating a lot of "banana pudding" and now has changed her diet and would like to do simva 40 along with that. Told her I would ask Dr. Mariah Milling and call her with advice.  ----- Message ----- From: Antonieta Iba, MD Sent: 11/18/2010 10:26 PM To: Mal Amabile, RN Subject: LFTS   LFTS not that high. Almost normal range  Would continue crestor Check LFTs again in 3 months    ----- Message ----- From: Mal Amabile, RN Sent: 11/13/2010 2:46 PM To: Antonieta Iba, MD Subject: Annell Greening: Scan   Pt's LFT's elevated at Dr. Theodoro Parma office. Pt is concerned since we recently changed her from Simvastatin 40mg  to Crestor 40mg . Her recent lipids came back better, LDL now 56. Please review labs and advise if pt needs to hold statins all together or should she switch back to simvastatin.

## 2010-11-26 ENCOUNTER — Other Ambulatory Visit: Payer: Self-pay | Admitting: Cardiovascular Disease

## 2010-11-26 DIAGNOSIS — E785 Hyperlipidemia, unspecified: Secondary | ICD-10-CM

## 2010-11-26 MED ORDER — SIMVASTATIN 40 MG PO TABS: 40.0000 mg | ORAL_TABLET | Freq: Every evening | ORAL | Status: DC

## 2010-11-26 NOTE — Telephone Encounter (Signed)
Spoke to pt, scheduled labs for 3 months.

## 2010-11-26 NOTE — Telephone Encounter (Signed)
Notified pt below, pt had stated that she wanted to take Simvastatin previously, sent in Rx. Will need to schedule repeat labs in 3 months. Pt to call back.

## 2010-11-27 NOTE — Telephone Encounter (Signed)
Opened in error

## 2010-12-23 ENCOUNTER — Other Ambulatory Visit: Payer: Self-pay | Admitting: Internal Medicine

## 2010-12-23 MED ORDER — ALPRAZOLAM 0.5 MG PO TABS: 0.5000 mg | ORAL_TABLET | Freq: Every evening | ORAL | Status: DC | PRN

## 2010-12-23 NOTE — Telephone Encounter (Signed)
Patient called and stated she went to the endocrologist and stated that her high blood calcium maybe coming from the Vitamin D 40981 so they changed her to OTC Vitamin D 1000mg  x1 daily.  She just wanted you to know.

## 2010-12-26 ENCOUNTER — Other Ambulatory Visit: Payer: Self-pay | Admitting: Internal Medicine

## 2010-12-26 MED ORDER — ALPRAZOLAM 0.5 MG PO TABS: 0.5000 mg | ORAL_TABLET | Freq: Every evening | ORAL | Status: DC | PRN

## 2010-12-30 ENCOUNTER — Encounter: Payer: Self-pay | Admitting: Cardiovascular Disease

## 2010-12-30 ENCOUNTER — Ambulatory Visit (INDEPENDENT_AMBULATORY_CARE_PROVIDER_SITE_OTHER): Payer: PRIVATE HEALTH INSURANCE | Admitting: Cardiovascular Disease

## 2010-12-30 DIAGNOSIS — I25810 Atherosclerosis of coronary artery bypass graft(s) without angina pectoris: Secondary | ICD-10-CM

## 2010-12-30 DIAGNOSIS — I739 Peripheral vascular disease, unspecified: Secondary | ICD-10-CM

## 2010-12-30 DIAGNOSIS — Z87891 Personal history of nicotine dependence: Secondary | ICD-10-CM

## 2010-12-30 DIAGNOSIS — E785 Hyperlipidemia, unspecified: Secondary | ICD-10-CM

## 2010-12-30 DIAGNOSIS — I658 Occlusion and stenosis of other precerebral arteries: Secondary | ICD-10-CM

## 2010-12-30 DIAGNOSIS — R079 Chest pain, unspecified: Secondary | ICD-10-CM | POA: Insufficient documentation

## 2010-12-30 NOTE — Assessment & Plan Note (Signed)
Her chest pain symptoms are somewhat atypical. We had suggested that we closely monitor her. If she has any additional episodes of pain, I would recommend a stress test. She is concerned that her symptoms could have come from eating "cheese-its" to quickly. It certainly could be indigestion.

## 2010-12-30 NOTE — Assessment & Plan Note (Addendum)
We congratulated her on smoking cessation last year.

## 2010-12-30 NOTE — Patient Instructions (Signed)
You are doing well. No medication changes were made. Please call us if you have any further chest pain episodes. If no more chest pain, we will call you for a follow up in 6 months.

## 2010-12-30 NOTE — Assessment & Plan Note (Signed)
We have stressed to her the importance of good cholesterol control. Will discuss with her on her next visit whether it is time for a lower extremity ultrasound to exclude worsening peripheral vascular disease.

## 2010-12-30 NOTE — Progress Notes (Signed)
Patient ID: [name], female    DOB: 1946-02-12, 65 y.o.   MRN: [id number]  HPI Comments: [name] is a pleasant 65 year old woman with a history of coronary artery disease, stent to her left circumflex and 2 stents to the LAD in March of 2011, bypass grafting of her left femoral by her report in early October with occlusion of her stents in mid-October requiring bypass surgery at [facility] in mid to late October. She presents for routine followup.  She reports that she was at work last week and had sub-xiphoid discomfort. It lasted for 20 seconds. She had an additional episode one hour later. EMTs were called and her EKG looked essentially unchanged to previous EKGs. She has not had any further episodes since that time. She has been active and otherwise has no complaints. She did have one period of discomfort in her back but attributes this to her gallbladder disease.   Overall, she reports that she is doing well currently she has a long history of smoking though stopped in March of this year.     Notes indicate that she did have ventricular arrhythmia when her stents were placed in the Cath Lab requiring shock. She was started on an ACE inhibitor after being discharged from the hospital in March.     echocardiogram improved, EF  now greater than 40%.   Cardiac catheter report from March 28 indicate she had a vision bare metal stent 2.75 x 28 mm in the LCX, mini vision bare metal stent 2.5 x 28 and 2.5 x 15 mm stent to the LAD.   EKG today shows no sinus rhythm with rate73 beats per minute, nonspecific ST changes in leads V4 to V6. Unable to exclude anteroseptal infarct. Left axis deviation      Outpatient Encounter Prescriptions as of 12/30/2010  Medication Sig Dispense Refill  . ALPRAZolam (XANAX) 0.5 MG tablet Take 1 tablet (0.5 mg total) by mouth at bedtime as needed.  90 tablet  2  . aspirin 81 MG EC tablet Take 81 mg by mouth daily.        . Cholecalciferol (VITAMIN D3) 1000 UNITS CAPS  Take 1,000 Units by mouth 1 day or 1 dose.        Marland Kitchen HYDROcodone-acetaminophen (LORTAB) 7.5-500 MG per tablet Take 1 tablet by mouth every 6 (six) hours as needed.        . metoprolol tartrate (LOPRESSOR) 25 MG tablet 25 mg. Take 1/2  tablet twice a day.      . Omega-3 Fatty Acids (FISH OIL BURP-LESS) 1200 MG CAPS Take by mouth 1 dose over 46 hours.        . pantoprazole (PROTONIX) 20 MG tablet Take 20 mg by mouth daily.        . simvastatin (ZOCOR) 40 MG tablet Take 1 tablet (40 mg total) by mouth every evening.  30 tablet  6     Review of Systems  Constitutional: Negative.   HENT: Negative.   Eyes: Negative.   Respiratory: Negative.   Cardiovascular: Positive for chest pain.  Gastrointestinal: Negative.   Musculoskeletal: Negative.   Skin: Negative.   Neurological: Negative.   Hematological: Negative.   Psychiatric/Behavioral: Negative.   All other systems reviewed and are negative.    BP 167/95  Pulse 80  Ht 5\' 2"  (1.575 m)  Wt 120 lb (54.432 kg)  BMI 21.95 kg/m2   Physical Exam  Nursing note and vitals reviewed. Constitutional: She is oriented to person,  place, and time. She appears well-developed and well-nourished.  HENT:  Head: Normocephalic.  Nose: Nose normal.  Mouth/Throat: Oropharynx is clear and moist.  Eyes: Conjunctivae are normal. Pupils are equal, round, and reactive to light.  Neck: Normal range of motion. Neck supple. No JVD present.  Cardiovascular: Normal rate, regular rhythm, S1 normal, S2 normal, normal heart sounds and intact distal pulses.  Exam reveals no gallop and no friction rub.   No murmur heard. Pulmonary/Chest: Effort normal and breath sounds normal. No respiratory distress. She has no wheezes. She has no rales. She exhibits no tenderness.  Abdominal: Soft. Bowel sounds are normal. She exhibits no distension. There is no tenderness.  Musculoskeletal: Normal range of motion. She exhibits no edema and no tenderness.  Lymphadenopathy:    She  has no cervical adenopathy.  Neurological: She is alert and oriented to person, place, and time. Coordination normal.  Skin: Skin is warm and dry. No rash noted. No erythema.  Psychiatric: She has a normal mood and affect. Her behavior is normal. Judgment and thought content normal.         Assessment and Plan

## 2010-12-30 NOTE — Assessment & Plan Note (Signed)
We will order a cholesterol to be done at [facility] where she has benefits through the [place]. Goal LDL less than 70, total cholesterol less than 150.

## 2011-01-01 ENCOUNTER — Other Ambulatory Visit: Payer: Self-pay | Admitting: Cardiovascular Disease

## 2011-01-01 ENCOUNTER — Telehealth: Payer: Self-pay | Admitting: *Deleted

## 2011-01-01 MED ORDER — ROSUVASTATIN CALCIUM 40 MG PO TABS: 40.0000 mg | ORAL_TABLET | Freq: Every day | ORAL | Status: DC

## 2011-01-01 NOTE — Telephone Encounter (Signed)
Discussed w/ pt lab results after Dr. Mariah Milling reviewed. Pt's AST/ALT normal, however, GGT on liver profile elevated at 144. Pt has been on Lortab for long period, and she has concern regarding her gallbladder. Advised she have PCP evaluate, she will call Dr. Tilman Neat office for appt and she states labs have been sent to her office as well. Per Dr. Mariah Milling, no concern for her taking statins. Her total cholesterol increased at 196. Pt will stop simvastatin and will restart Crestor 40mg .

## 2011-01-06 ENCOUNTER — Telehealth: Payer: Self-pay | Admitting: *Deleted

## 2011-01-06 NOTE — Telephone Encounter (Signed)
Left message for patient to return my call.

## 2011-01-06 NOTE — Telephone Encounter (Signed)
Message copied by Jobie Quaker on Tue Jan 06, 2011  4:39 PM ------      Message from: Ronna Polio A      Created: Thu Jan 01, 2011  8:01 AM      Regarding: Labs       Labs from [facility] look fine. Calcium is still high at 11.

## 2011-02-20 ENCOUNTER — Telehealth: Payer: Self-pay | Admitting: Internal Medicine

## 2011-02-20 DIAGNOSIS — Z79899 Other long term (current) drug therapy: Secondary | ICD-10-CM

## 2011-02-20 NOTE — Telephone Encounter (Signed)
[phone] Pt would like you to call her. She has some questions that were personal

## 2011-02-20 NOTE — Telephone Encounter (Signed)
Please set her up for a visit to discuss.

## 2011-02-20 NOTE — Telephone Encounter (Signed)
Patient called wanted to get questions answered about her lab work that spoke with [name] around 01/02/11   Liver panel  ggt. 144 normal 0-60 This labs came from [facility] lab was done 12/30/10.  She is concerned that she never received a call back Dr Mariah Milling wanted her to talk her primary care dr. Please call [name] about this concern she is upset about not getting a call back. If calling on monday please call [phone]  Please leave message

## 2011-02-20 NOTE — Telephone Encounter (Signed)
Tried to call patient and got voice mail.  I have meeting then patients rest of day. Can you please set her up for an appointment to discuss next week?

## 2011-02-20 NOTE — Telephone Encounter (Signed)
See below She also wanted to let you know that she went to [facility] last week and had another echo done and they told her that the echo was great.

## 2011-02-23 NOTE — Telephone Encounter (Signed)
Spoke w/patient. Her main complaint/concern is the delay in how the office called her. Per MD, ok to repeat labs, she needs written order. It will be signed tomorrow and patient will pick up this week.

## 2011-02-23 NOTE — Telephone Encounter (Signed)
Spoke with pt this morning. patient would like you to call,  She does not want to come in and pay for an office visit

## 2011-02-23 NOTE — Telephone Encounter (Signed)
Spoke with Zella Ball and Dr Dan Humphreys - I will call patient at the end of the day to discuss her concerns. Per MD, she can come back for labs only to recheck.

## 2011-02-23 NOTE — Telephone Encounter (Signed)
I am seeing patients all day today. It would be best for her to set up appointment to discuss issues.

## 2011-02-24 ENCOUNTER — Other Ambulatory Visit: Payer: PRIVATE HEALTH INSURANCE | Admitting: *Deleted

## 2011-02-24 NOTE — Telephone Encounter (Signed)
Called and left message for patient.

## 2011-03-24 ENCOUNTER — Telehealth: Payer: Self-pay | Admitting: *Deleted

## 2011-03-24 NOTE — Telephone Encounter (Signed)
Pt called stating she has noticed wt gain in abdomen over past 2-3 months. She does not feel she has changed her diet; she says does not know weight now, "have not stepped on scale." Wt in office last 120lb. She had read online that Crestor could cause weight gain. I told her I was not aware of this, but will forward to Dr. Mariah Milling if he knew of any relation. I did discuss healthy eating options with pt and advised she also discuss with pcp and to evaluate for possible cause for wt gain in belly. Pt did say currently having 24hr urine tested for abnormal PTH at [facility] with Dr. Marquist Binstock Robert. I did advise she continue Crestor w/ her hx. She will have repeat lipid/lft in Jan 2013. Told pt we would call back with any new rec's, otherwise she will talk with Dr. Maybree Riling Robert or Dr. Dan Humphreys.

## 2011-03-25 ENCOUNTER — Telehealth: Payer: Self-pay | Admitting: *Deleted

## 2011-03-25 NOTE — Telephone Encounter (Signed)
Pt called stating she does not think last weight was put in correctly, she says she was 131lb on 12/30/10 she remembers seeing this on scale. 120lb was documented. I told pt I would make a note of this per her request. She does not think she has gained 14lbs, only about 4-5 lbs, per conversation of last note.

## 2011-04-09 ENCOUNTER — Encounter: Payer: Self-pay | Admitting: Internal Medicine

## 2011-04-09 ENCOUNTER — Ambulatory Visit (INDEPENDENT_AMBULATORY_CARE_PROVIDER_SITE_OTHER): Payer: PRIVATE HEALTH INSURANCE | Admitting: Internal Medicine

## 2011-04-09 VITALS — BP 120/78 | HR 76 | Temp 98.0°F | Wt 132.0 lb

## 2011-04-09 DIAGNOSIS — R1013 Epigastric pain: Secondary | ICD-10-CM

## 2011-04-09 DIAGNOSIS — K219 Gastro-esophageal reflux disease without esophagitis: Secondary | ICD-10-CM

## 2011-04-09 MED ORDER — PANTOPRAZOLE SODIUM 20 MG PO TBEC: 20.0000 mg | DELAYED_RELEASE_TABLET | Freq: Two times a day (BID) | ORAL | Status: DC

## 2011-04-09 NOTE — Progress Notes (Signed)
Subjective:    Patient ID: [name], female    DOB: 09-09-1945, 65 y.o.   MRN: [id number]  HPI 65 year old female with a history of CAD presents for an acute visit complaining of a several week history of epigastric abdominal pain. She reports that since Thanksgiving she has been eating more fried foods and more high-fat and high sugar foods. She has noticed some abdominal bloating and epigastric discomfort since that time. She denies any nausea or vomiting. She denies any diarrhea or change in her bowel habits. She denies any blood in her stool or black stool. She is concerned that her abdominal pain and distention may be related to her gallbladder. She denies any fever or chills. She notes that she has had some weight gain over the last few weeks.  Aside from this, she reports that she has been doing well. She requests to have her labs repeated today including cholesterol profile.  Outpatient Encounter Prescriptions as of 04/09/2011  Medication Sig Dispense Refill  . ALPRAZolam (XANAX) 0.5 MG tablet Take 1 tablet (0.5 mg total) by mouth at bedtime as needed.  90 tablet  2  . aspirin 81 MG EC tablet Take 81 mg by mouth daily.        . Cholecalciferol (VITAMIN D3) 1000 UNITS CAPS Take 1,000 Units by mouth 1 day or 1 dose.        . Garlic 1000 MG CAPS Take by mouth daily.        Marland Kitchen HYDROcodone-acetaminophen (LORTAB) 7.5-500 MG per tablet Take 1 tablet by mouth every 6 (six) hours as needed.        . metoprolol tartrate (LOPRESSOR) 25 MG tablet Take 12.5 mg by mouth 2 (two) times daily.       . Omega-3 Fatty Acids (FISH OIL BURP-LESS) 1200 MG CAPS Take by mouth 1 dose over 46 hours.        . pantoprazole (PROTONIX) 20 MG tablet Take 1 tablet (20 mg total) by mouth 2 (two) times daily.  60 tablet  3  . rosuvastatin (CRESTOR) 40 MG tablet Take 1 tablet (40 mg total) by mouth daily.  30 tablet  6    Review of Systems  Constitutional: Negative for fever, chills, appetite change, fatigue and  unexpected weight change.  HENT: Negative for ear pain, congestion, sore throat, trouble swallowing, neck pain, voice change and sinus pressure.   Eyes: Negative for visual disturbance.  Respiratory: Negative for cough, shortness of breath, wheezing and stridor.   Cardiovascular: Negative for chest pain, palpitations and leg swelling.  Gastrointestinal: Positive for abdominal pain and abdominal distention. Negative for nausea, vomiting, diarrhea, constipation, blood in stool and anal bleeding.  Genitourinary: Negative for dysuria and flank pain.  Musculoskeletal: Negative for myalgias, arthralgias and gait problem.  Skin: Negative for color change and rash.  Neurological: Negative for dizziness and headaches.  Hematological: Negative for adenopathy. Does not bruise/bleed easily.  Psychiatric/Behavioral: Negative for suicidal ideas, sleep disturbance and dysphoric mood. The patient is not nervous/anxious.    BP 120/78  Pulse 76  Temp(Src) 98 F (36.7 C) (Oral)  Wt 132 lb (59.875 kg)  SpO2 98%     Objective:   Physical Exam  Constitutional: She is oriented to person, place, and time. She appears well-developed and well-nourished. No distress.  HENT:  Head: Normocephalic and atraumatic.  Right Ear: External ear normal.  Left Ear: External ear normal.  Nose: Nose normal.  Mouth/Throat: Oropharynx is clear and moist. No oropharyngeal exudate.  Eyes: Conjunctivae are normal. Pupils are equal, round, and reactive to light. Right eye exhibits no discharge. Left eye exhibits no discharge. No scleral icterus.  Neck: Normal range of motion. Neck supple. No tracheal deviation present. No thyromegaly present.  Cardiovascular: Normal rate, regular rhythm, normal heart sounds and intact distal pulses.  Exam reveals no gallop and no friction rub.   No murmur heard. Pulmonary/Chest: Effort normal and breath sounds normal. No respiratory distress. She has no wheezes. She has no rales. She exhibits no  tenderness.  Abdominal: Soft. Bowel sounds are normal. She exhibits no distension and no mass. There is tenderness (epigastric). There is no rebound and no guarding.  Musculoskeletal: Normal range of motion. She exhibits no edema and no tenderness.  Lymphadenopathy:    She has no cervical adenopathy.  Neurological: She is alert and oriented to person, place, and time. No cranial nerve deficit. She exhibits normal muscle tone. Coordination normal.  Skin: Skin is warm and dry. No rash noted. She is not diaphoretic. No erythema. No pallor.  Psychiatric: She has a normal mood and affect. Her behavior is normal. Judgment and thought content normal.          Assessment & Plan:  1. Abdominal pain -symptoms and exam are more consistent with gastritis or ulcer. She has been taking Protonix and we will have her increase this to twice daily. We will check labs today including CBC, CMP, and testing for H. pylori. If lab work is normal, would prefer to get abdominal ultrasound of the gallbladder for initial evaluation. If this too was normal she may need upper endoscopy for further evaluation. In the interim, encouraged her to limit intake of high-fat foods, particularly fried foods. She will followup in 2 weeks.  2. Hyperlipidemia -will check lipid profile with labs today.

## 2011-04-13 ENCOUNTER — Telehealth: Payer: Self-pay | Admitting: Internal Medicine

## 2011-04-13 NOTE — Telephone Encounter (Signed)
Attempted to call pt. Left message. Labs are normal except for persistently elevated Calcium.  Does she have follow up with endocrinology about elevated Ca? GGT that she was concerned about is 67 (just over normal range). Has her nausea improved? H. Pylori was negative. If nausea not improved, I would like to set up abdominal US and GI evaluation.

## 2011-04-16 NOTE — Telephone Encounter (Signed)
Message sent via MyChart.

## 2011-04-17 ENCOUNTER — Telehealth: Payer: Self-pay | Admitting: *Deleted

## 2011-04-17 NOTE — Telephone Encounter (Signed)
Left pt VM apologizing for late evening call and that I would return to the office on Thursday and Dr Dan Humphreys was out of the office all next week. I will call patient again next Thursday.

## 2011-04-17 NOTE — Telephone Encounter (Signed)
Patient requesting a call back regarding results of labs.

## 2011-04-21 HISTORY — PX: CARDIAC CATHETERIZATION: SHX172

## 2011-04-21 HISTORY — PX: FACIAL COSMETIC SURGERY: SHX629

## 2011-04-23 ENCOUNTER — Other Ambulatory Visit: Payer: PRIVATE HEALTH INSURANCE | Admitting: *Deleted

## 2011-04-24 NOTE — Telephone Encounter (Signed)
I spoke w/pt -   1. She has f/u with endo soon.  2. Pt states that MD advised that she would give RX for protonix to take BID. RX was for 20 mg bid and patient states that 40mg  q am worked better that 20 bid. She would like to resume 40 mg q am. I advised pt to take 2 of the 20 mg pills and I would get back w/her when MD returned to the office next week.   3. Patient requesting information from MD regarding nutrition. She would like articles or information mailed to her home.   4. Also discussed and answered all her questions regarding lab results, she had no further questions.

## 2011-04-25 MED ORDER — PANTOPRAZOLE SODIUM 40 MG PO TBEC: 40.0000 mg | DELAYED_RELEASE_TABLET | Freq: Every day | ORAL | Status: DC

## 2011-04-25 NOTE — Telephone Encounter (Signed)
Fine for her to take Protonix 40mg  daily.  We can call this in for her.  I would recommend for her to read the book "Prevent and Reverse Heart Disease" by Neomia Dear. It has heart healthy recipes.  Also [email or web address] is a good resource.

## 2011-04-25 NOTE — Telephone Encounter (Signed)
Mychart message sent.

## 2011-04-30 ENCOUNTER — Ambulatory Visit: Payer: PRIVATE HEALTH INSURANCE | Admitting: Internal Medicine

## 2011-05-14 ENCOUNTER — Encounter: Payer: Self-pay | Admitting: Internal Medicine

## 2011-05-14 ENCOUNTER — Ambulatory Visit (INDEPENDENT_AMBULATORY_CARE_PROVIDER_SITE_OTHER): Payer: PRIVATE HEALTH INSURANCE | Admitting: Internal Medicine

## 2011-05-14 VITALS — BP 124/72 | HR 71 | Temp 98.2°F | Ht 62.0 in | Wt 135.0 lb

## 2011-05-14 DIAGNOSIS — R140 Abdominal distension (gaseous): Secondary | ICD-10-CM | POA: Insufficient documentation

## 2011-05-14 DIAGNOSIS — R141 Gas pain: Secondary | ICD-10-CM

## 2011-05-14 NOTE — Assessment & Plan Note (Signed)
Pt with extensive, progressive bloating. Lab evaluation is normal. Exam is remarkable for abdominal distension but no obvious mass. I am concerned about underlying malignancy, including ovarian malignancy. Will get CT abdomen and pelvis for further evaluation.

## 2011-05-14 NOTE — Progress Notes (Signed)
Subjective:    Patient ID: [name], female    DOB: [birth year], 66 y.o.   MRN: [id number]  HPI 66YO female with h/o CAD, HTN, HL presents for acute visit complaining of 1-2 month history of diffuse abdominal distension and weight gain. She first noted this a couple of months ago.  She has not made any changes in her diet. She denies nausea, vomiting, diarrhea, constipation.  She denies any change in bowel habits or blood in stool.  She denies fever or chills.  She notes some weight gain, including 3lb weight gain this week.  She has had to change her wardrobe because of distension. She denies dyspnea, lower extremity edema, chest pain or other symptoms. She denies fatigue.  Outpatient Encounter Prescriptions as of 05/14/2011  Medication Sig Dispense Refill  . ALPRAZolam (XANAX) 0.5 MG tablet Take 1 tablet (0.5 mg total) by mouth at bedtime as needed.  90 tablet  2  . aspirin 81 MG EC tablet Take 81 mg by mouth daily.        . Cholecalciferol (VITAMIN D3) 1000 UNITS CAPS Take 1,000 Units by mouth 1 day or 1 dose.        . Garlic 1000 MG CAPS Take by mouth daily.        Marland Kitchen HYDROcodone-acetaminophen (LORTAB) 7.5-500 MG per tablet Take 1 tablet by mouth every 6 (six) hours as needed.        . metoprolol tartrate (LOPRESSOR) 25 MG tablet Take 12.5 mg by mouth 2 (two) times daily.       . Omega-3 Fatty Acids (FISH OIL BURP-LESS) 1200 MG CAPS Take by mouth 1 dose over 46 hours.        . pantoprazole (PROTONIX) 40 MG tablet Take 1 tablet (40 mg total) by mouth daily.  90 tablet  1  . rosuvastatin (CRESTOR) 40 MG tablet Take 1 tablet (40 mg total) by mouth daily.  30 tablet  6    Review of Systems  Constitutional: Negative for fever, chills, appetite change, fatigue and unexpected weight change.  HENT: Negative for ear pain, congestion, sore throat, trouble swallowing, neck pain, voice change and sinus pressure.   Eyes: Negative for visual disturbance.  Respiratory: Negative for cough, shortness of  breath, wheezing and stridor.   Cardiovascular: Negative for chest pain, palpitations and leg swelling.  Gastrointestinal: Positive for abdominal distention. Negative for nausea, vomiting, abdominal pain, diarrhea, constipation, blood in stool and anal bleeding.  Genitourinary: Negative for dysuria and flank pain.  Musculoskeletal: Negative for myalgias, arthralgias and gait problem.  Skin: Negative for color change and rash.  Neurological: Negative for dizziness and headaches.  Hematological: Negative for adenopathy. Does not bruise/bleed easily.  Psychiatric/Behavioral: Negative for suicidal ideas, sleep disturbance and dysphoric mood. The patient is not nervous/anxious.    BP 124/72  Pulse 71  Temp(Src) 98.2 F (36.8 C) (Oral)  Ht 5\' 2"  (1.575 m)  Wt 135 lb (61.236 kg)  BMI 24.69 kg/m2  SpO2 94%     Objective:   Physical Exam  Constitutional: She is oriented to person, place, and time. She appears well-developed and well-nourished. No distress.  HENT:  Head: Normocephalic and atraumatic.  Right Ear: External ear normal.  Left Ear: External ear normal.  Nose: Nose normal.  Mouth/Throat: Oropharynx is clear and moist. No oropharyngeal exudate.  Eyes: Conjunctivae are normal. Pupils are equal, round, and reactive to light. Right eye exhibits no discharge. Left eye exhibits no discharge. No scleral icterus.  Neck:  Normal range of motion. Neck supple. No tracheal deviation present. No thyromegaly present.  Cardiovascular: Normal rate, regular rhythm, normal heart sounds and intact distal pulses.  Exam reveals no gallop and no friction rub.   No murmur heard. Pulmonary/Chest: Effort normal and breath sounds normal. No respiratory distress. She has no wheezes. She has no rales. She exhibits no tenderness.  Abdominal: Soft. Bowel sounds are normal. She exhibits distension. She exhibits no mass. There is no tenderness. There is no rebound and no guarding.  Musculoskeletal: Normal range  of motion. She exhibits no edema and no tenderness.  Lymphadenopathy:    She has no cervical adenopathy.  Neurological: She is alert and oriented to person, place, and time. No cranial nerve deficit. She exhibits normal muscle tone. Coordination normal.  Skin: Skin is warm and dry. No rash noted. She is not diaphoretic. No erythema. No pallor.  Psychiatric: She has a normal mood and affect. Her behavior is normal. Judgment and thought content normal.          Assessment & Plan:

## 2011-05-19 ENCOUNTER — Ambulatory Visit: Payer: Self-pay | Admitting: Internal Medicine

## 2011-05-19 IMAGING — CT CT ABD-PELV W/ CM
1 of 2 series · 15 of 32 positions shown, 19 images · non-contrast
Comparison: none

REASON FOR EXAM: Bloating
COMMENTS:

[Series 2: abd with 5.0 i40f 3 · axial · 0.69mm/px · z∈[-158,+206]mm · 15 of 81 slices shown, 19 images]
[im 4/81  soft-tissue]
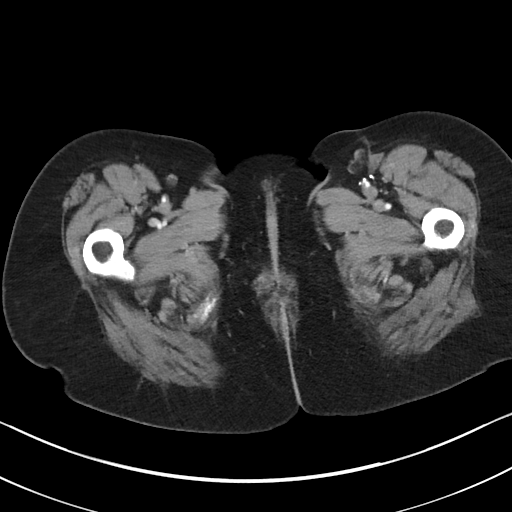
[im 4/81  bone]
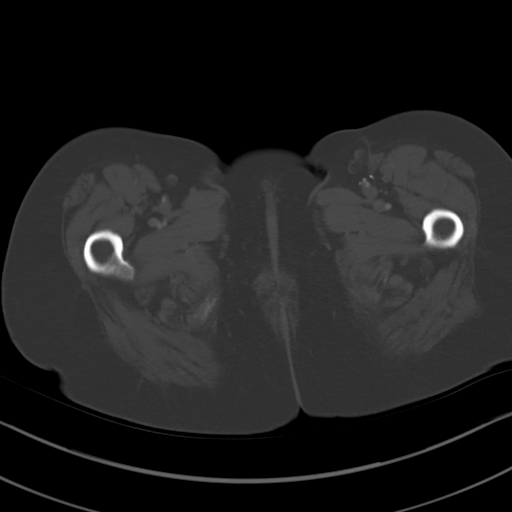
[im 11/81  soft-tissue]
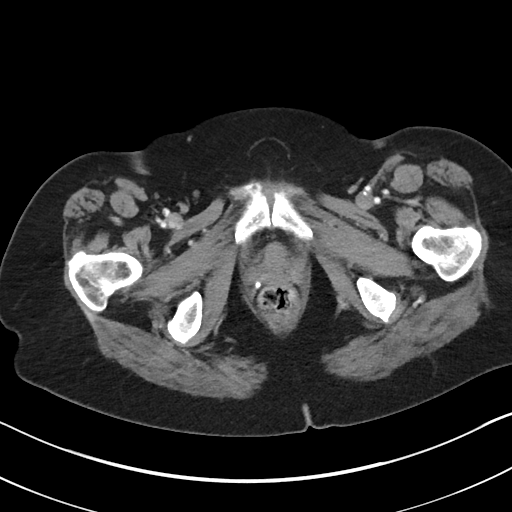
[im 17/81  soft-tissue]
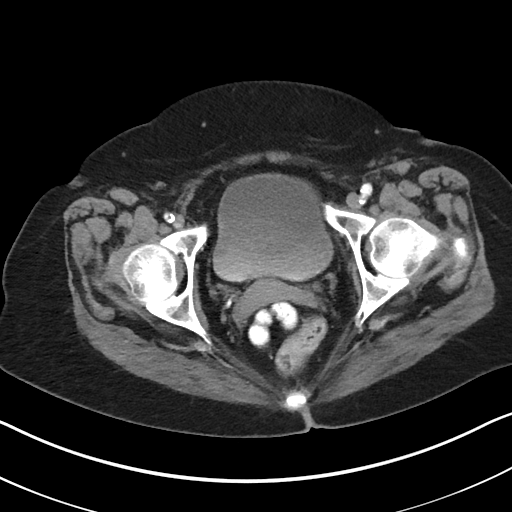
[im 24/81  soft-tissue]
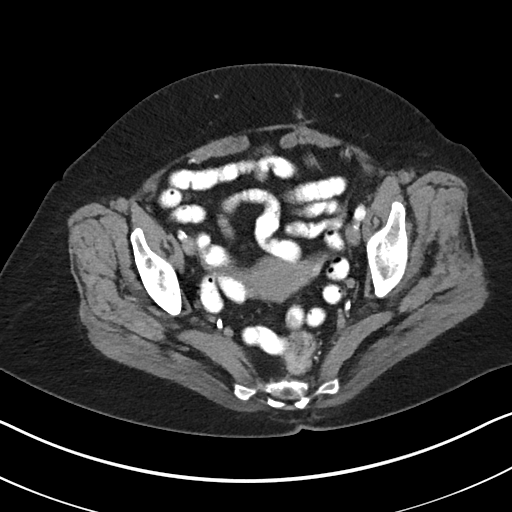
[im 27/81  soft-tissue]
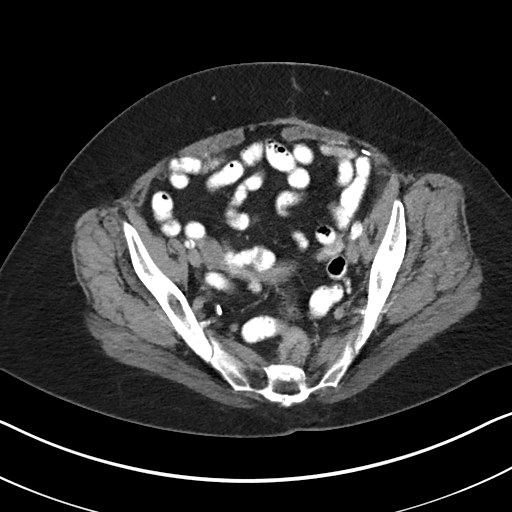
[im 34/81  soft-tissue]
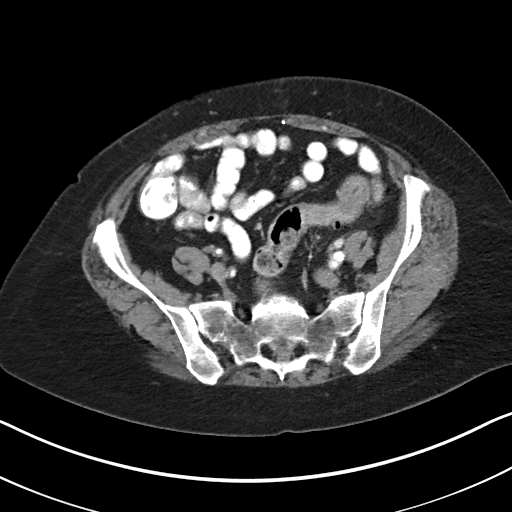
[im 41/81  soft-tissue]
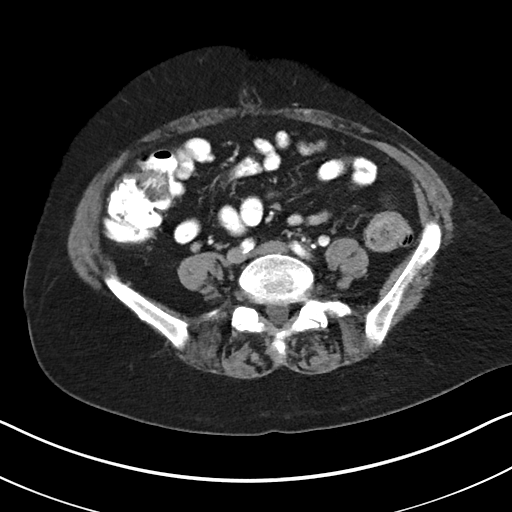
[im 47/81  soft-tissue]
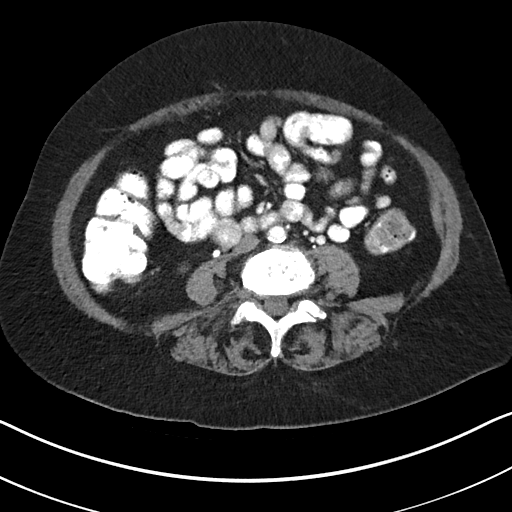
[im 54/81  soft-tissue]
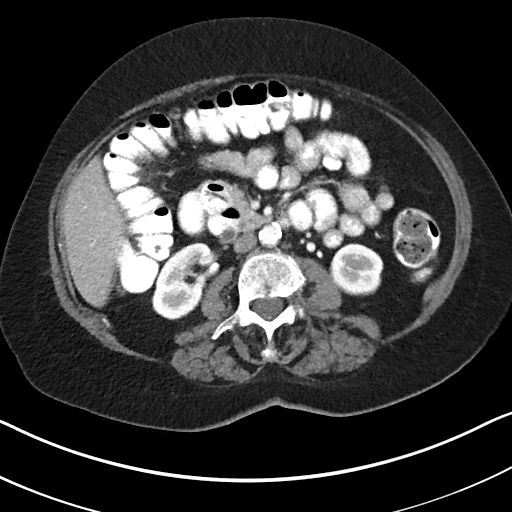
[im 54/81  bone]
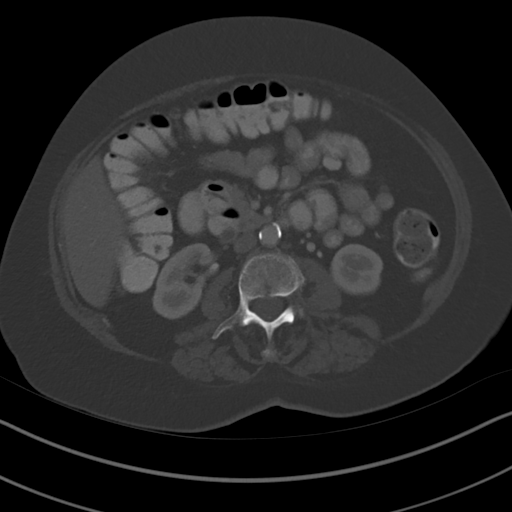
[im 57/81  soft-tissue]
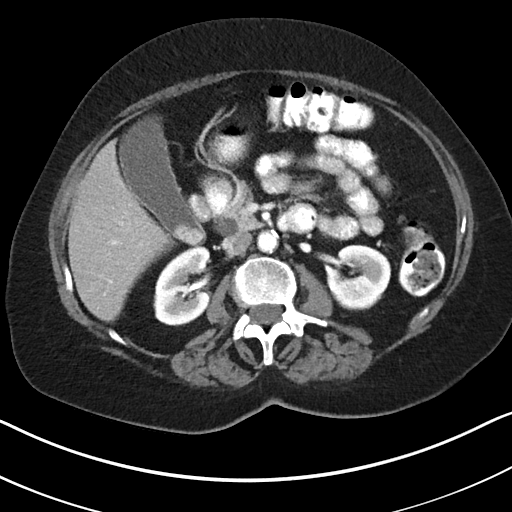
[im 64/81  soft-tissue]
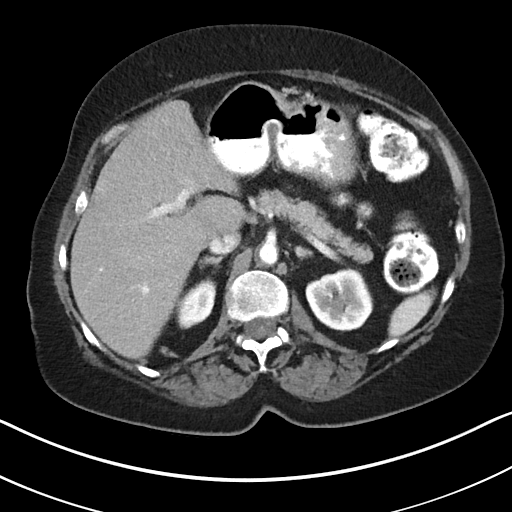
[im 67/81  lung]
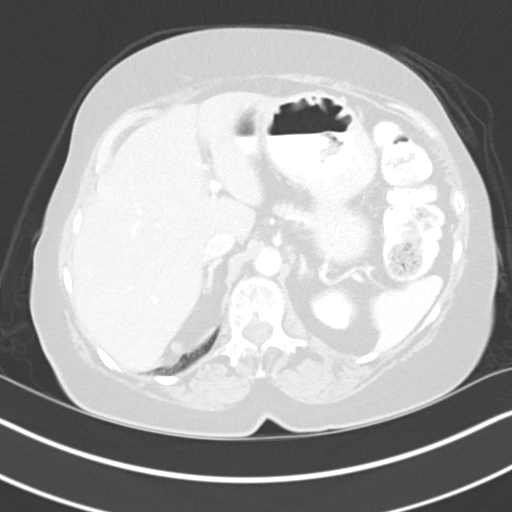
[im 71/81  soft-tissue]
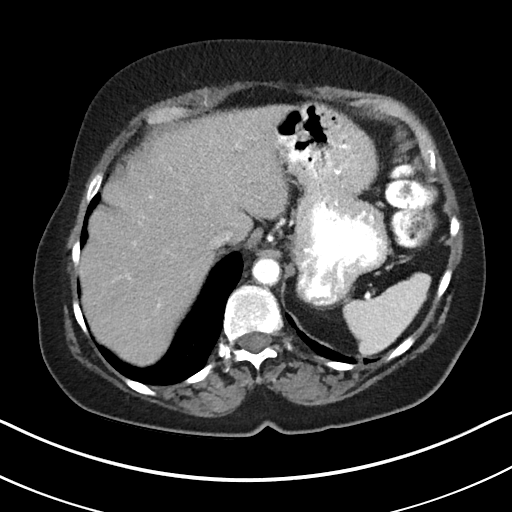
[im 71/81  lung]
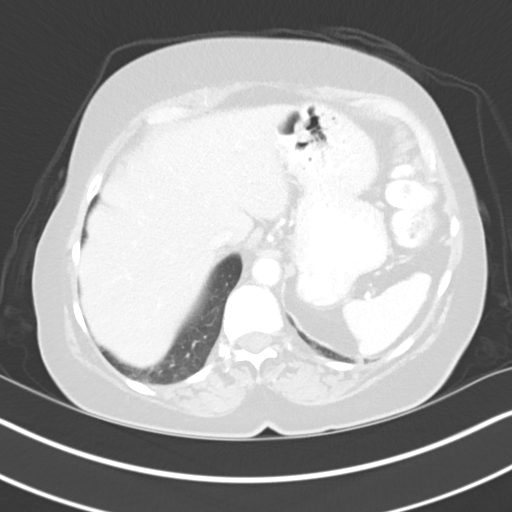
[im 74/81  lung]
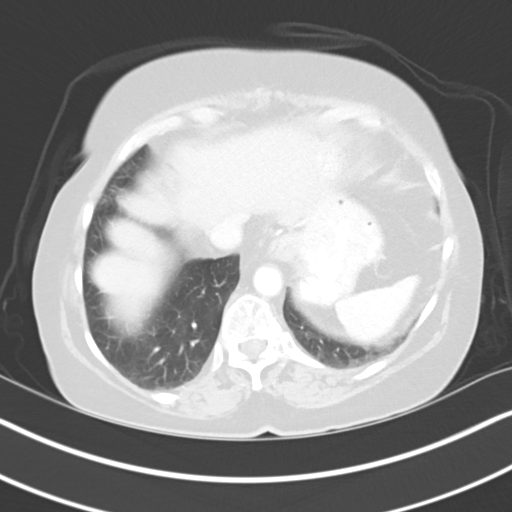
[im 77/81  soft-tissue]
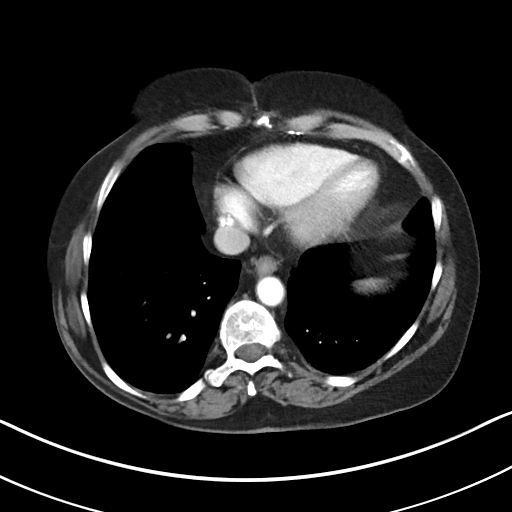
[im 77/81  lung]
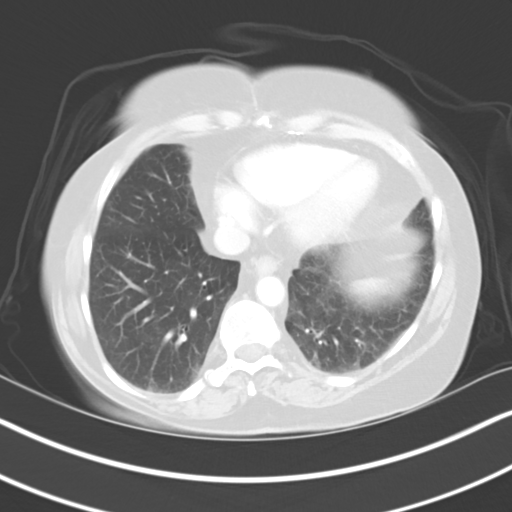

[15 of 32 positions shown; findings below may reference images not displayed]

PROCEDURE:     KCT - KCT ABDOMEN/PELVIS W  - [DATE]  [DATE]

RESULT:     CT of the abdomen and pelvis is performed with 85 mL of
[NM] iodinated intravenous contrast and oral contrast with images
reconstructed at 5.0 mm slice thickness in the axial plane. The patient has
no previous exam for comparison.

Images through the base of the lungs demonstrate dependent atelectasis
bilaterally with otherwise normal appearing aeration. Cholelithiasis is
present with at least two large stones in the gallbladder neck region. There
is no evidence of acute cholecystitis by CT. Mild fatty infiltration is seen
diffusely in the liver without a focal mass or ductal dilation. The stomach,
distal esophagus, spleen, adrenal glands, kidneys and aorta appear to be
within normal limits with atherosclerotic calcification present. No abnormal
small bowel distention or wall thickening is evident. The uterus is present
and appears unremarkable. The urinary bladder shows no definite wall
thickening or focal mass. No ascites or abnormal fluid collection is
evident. The abdominal wall is intact. The appendix is seen and appears
normal. Scattered colonic diverticulosis is present in the descending colon
primarily without evidence of acute diverticulitis. Within the spine there
is severe degenerative disc disease at L4-L5 with degenerative endplate
sclerosis consistent with Modic endplate changes. No compression deformity
or anterolisthesis is evident.
IMPRESSION: 1. No abnormal bowel distention or wall thickening.
2. Cholelithiasis.
3. Degenerative disc disease at L4-L5. If the patient is symptomatic for
this, then MRI may be beneficial to evaluate any degree of spinal stenosis
or foraminal narrowing.
4. Mild colonic diverticulosis.
5. Minimal hepatic steatosis suggested.

## 2011-05-20 ENCOUNTER — Telehealth: Payer: Self-pay | Admitting: Internal Medicine

## 2011-05-20 DIAGNOSIS — K8020 Calculus of gallbladder without cholecystitis without obstruction: Secondary | ICD-10-CM

## 2011-05-20 NOTE — Telephone Encounter (Signed)
CT abdomen showed large stones in the gallbladder neck which may be causing some of her symptoms.  It also showed severe degenerative disease in her spine at L4-5.  I would like to first set her up with General surgery for evaluation of gallbladder, then with ortho for evaluation spine.

## 2011-05-20 NOTE — Telephone Encounter (Signed)
Patient informed, She already has ortho. Surgeon referral entered.

## 2011-05-26 ENCOUNTER — Telehealth: Payer: Self-pay | Admitting: *Deleted

## 2011-05-26 NOTE — Telephone Encounter (Signed)
Surgeon with [facility] called in regards to referral that was made for this patient. He says that patient does have gallstones, but her complaints are weight gain and bloating and he is not sure that it would be from the gallstones. He is asking if you could call him at some point. He can be reached at [phone] and his pager number is [phone].

## 2011-05-27 NOTE — Telephone Encounter (Signed)
Attempted to return call. No answer.

## 2011-05-27 NOTE — Telephone Encounter (Signed)
Spoke with Dr. Katrinka Blazing who recommends not pursuing cholecystectomy at this time, given no symptoms such as abdominal pain, nausea, or lab changes.

## 2011-06-25 ENCOUNTER — Encounter: Payer: Self-pay | Admitting: Internal Medicine

## 2011-06-27 ENCOUNTER — Emergency Department: Payer: Self-pay | Admitting: Internal Medicine

## 2011-06-27 LAB — CBC
MCH: 32.7 pg (ref 26.0–34.0)
Platelet: 236 10*3/uL (ref 150–440)
RBC: 4.13 10*6/uL (ref 3.80–5.20)
RDW: 13.8 % (ref 11.5–14.5)
WBC: 7.2 10*3/uL (ref 3.6–11.0)

## 2011-06-27 LAB — URINALYSIS, COMPLETE
Bilirubin,UR: NEGATIVE
Blood: NEGATIVE
Ketone: NEGATIVE
Ph: 6 (ref 4.5–8.0)
Protein: NEGATIVE
RBC,UR: <1 /HPF (ref 0–5)
Specific Gravity: 1.003 (ref 1.003–1.030)
Squamous Epithelial: 1
WBC UR: 1 /HPF (ref 0–5)

## 2011-06-27 LAB — COMPREHENSIVE METABOLIC PANEL
Alkaline Phosphatase: 74 U/L (ref 50–136)
BUN: 15 mg/dL (ref 7–18)
Creatinine: 0.95 mg/dL (ref 0.60–1.30)
EGFR (African American): >60
EGFR (Non-African Amer.): >60
Glucose: 95 mg/dL (ref 65–99)
Osmolality: 274 (ref 275–301)
Potassium: 4.5 mmol/L (ref 3.5–5.1)
Sodium: 137 mmol/L (ref 136–145)

## 2011-06-27 LAB — CK TOTAL AND CKMB (NOT AT ARMC)
CK, Total: 1471 U/L — ABNORMAL HIGH (ref 21–215)
CK-MB: 2.5 ng/mL (ref 0.5–3.6)

## 2011-06-27 LAB — TROPONIN I: Troponin-I: <0.02 ng/mL

## 2011-06-27 IMAGING — CR DG CHEST 2V
1 series · 2 of 2 positions shown · non-contrast
Comparison: none

REASON FOR EXAM: weak
COMMENTS:   May transport without cardiac monitor

[Series 1: w chest pa · 0.14mm/px · 2 of 2 slices shown]
[im 1/2]
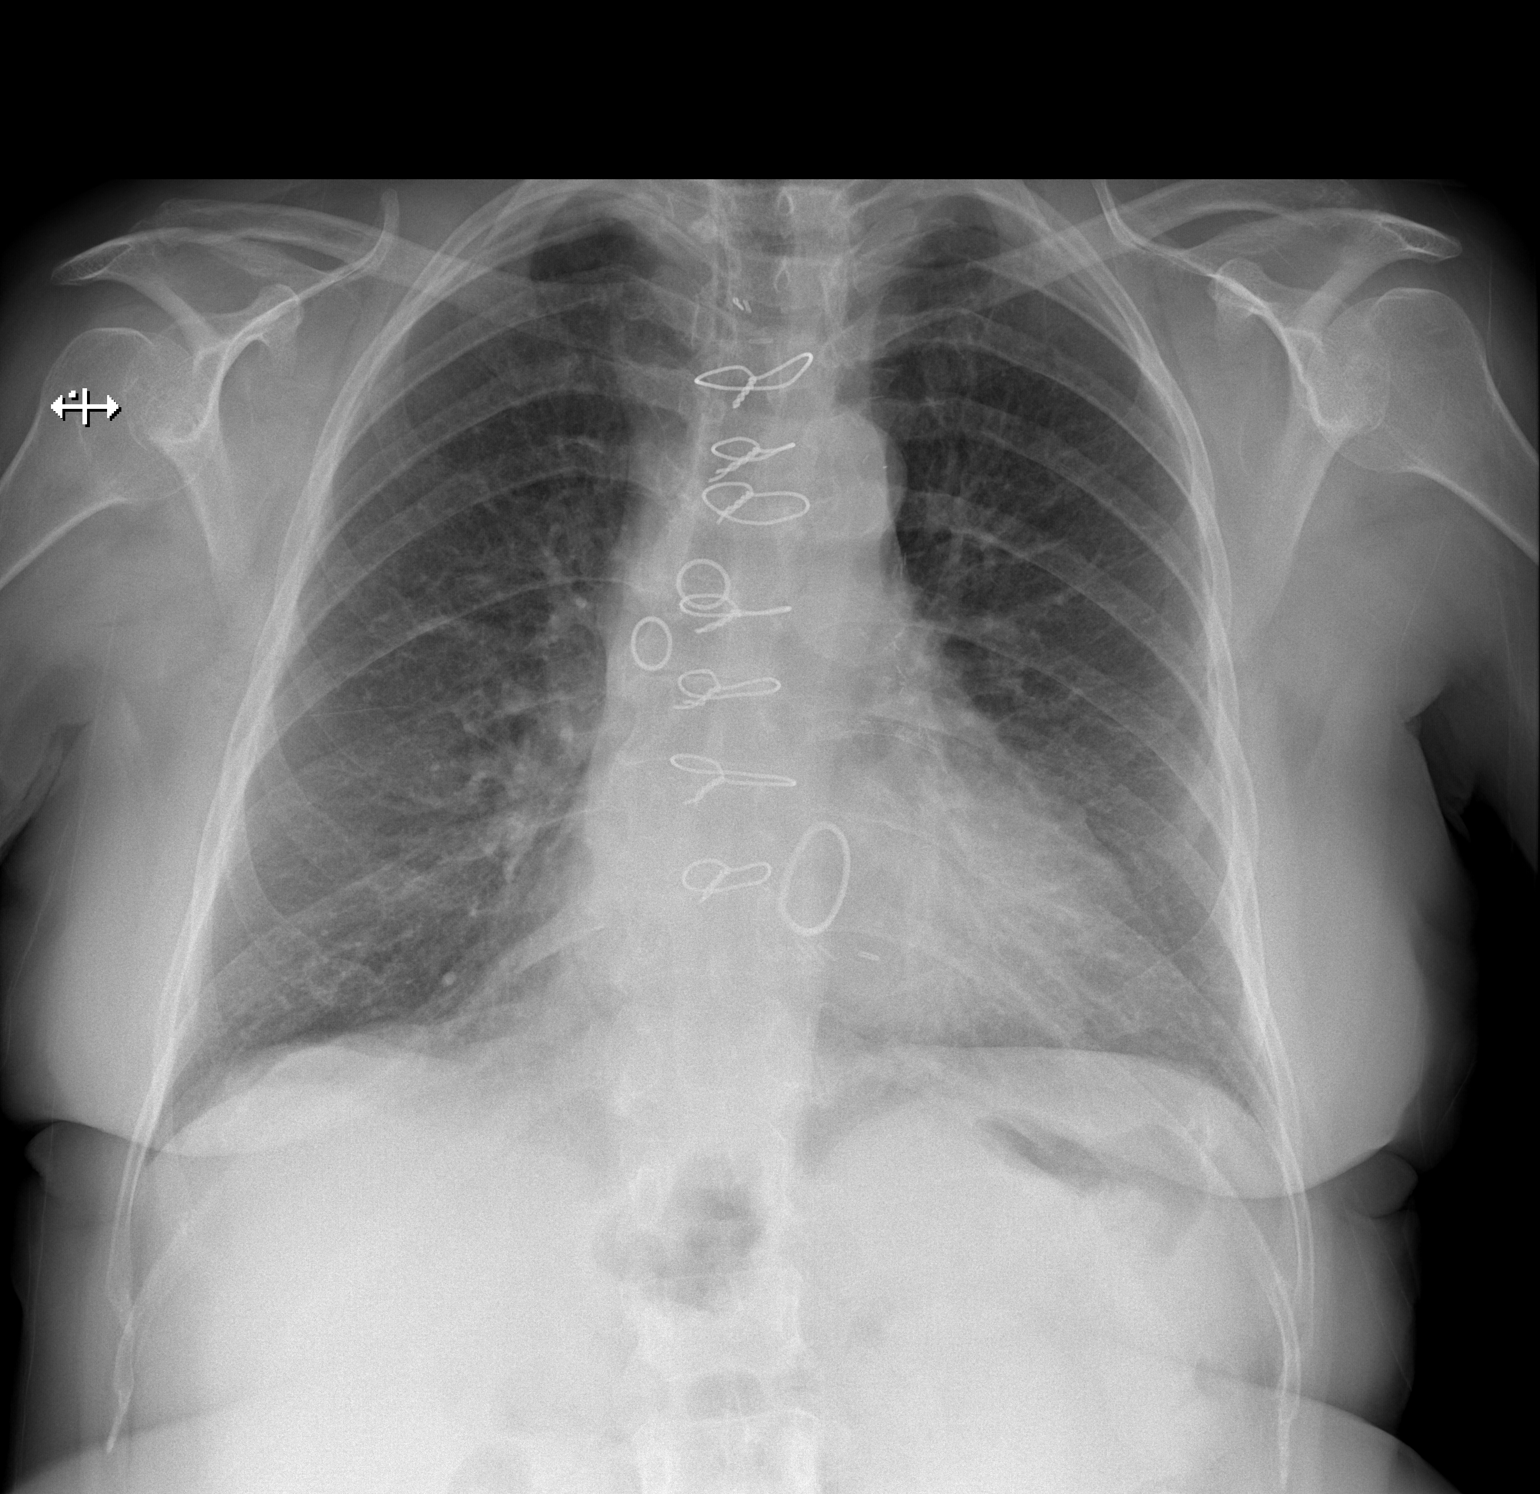
[im 2/2]
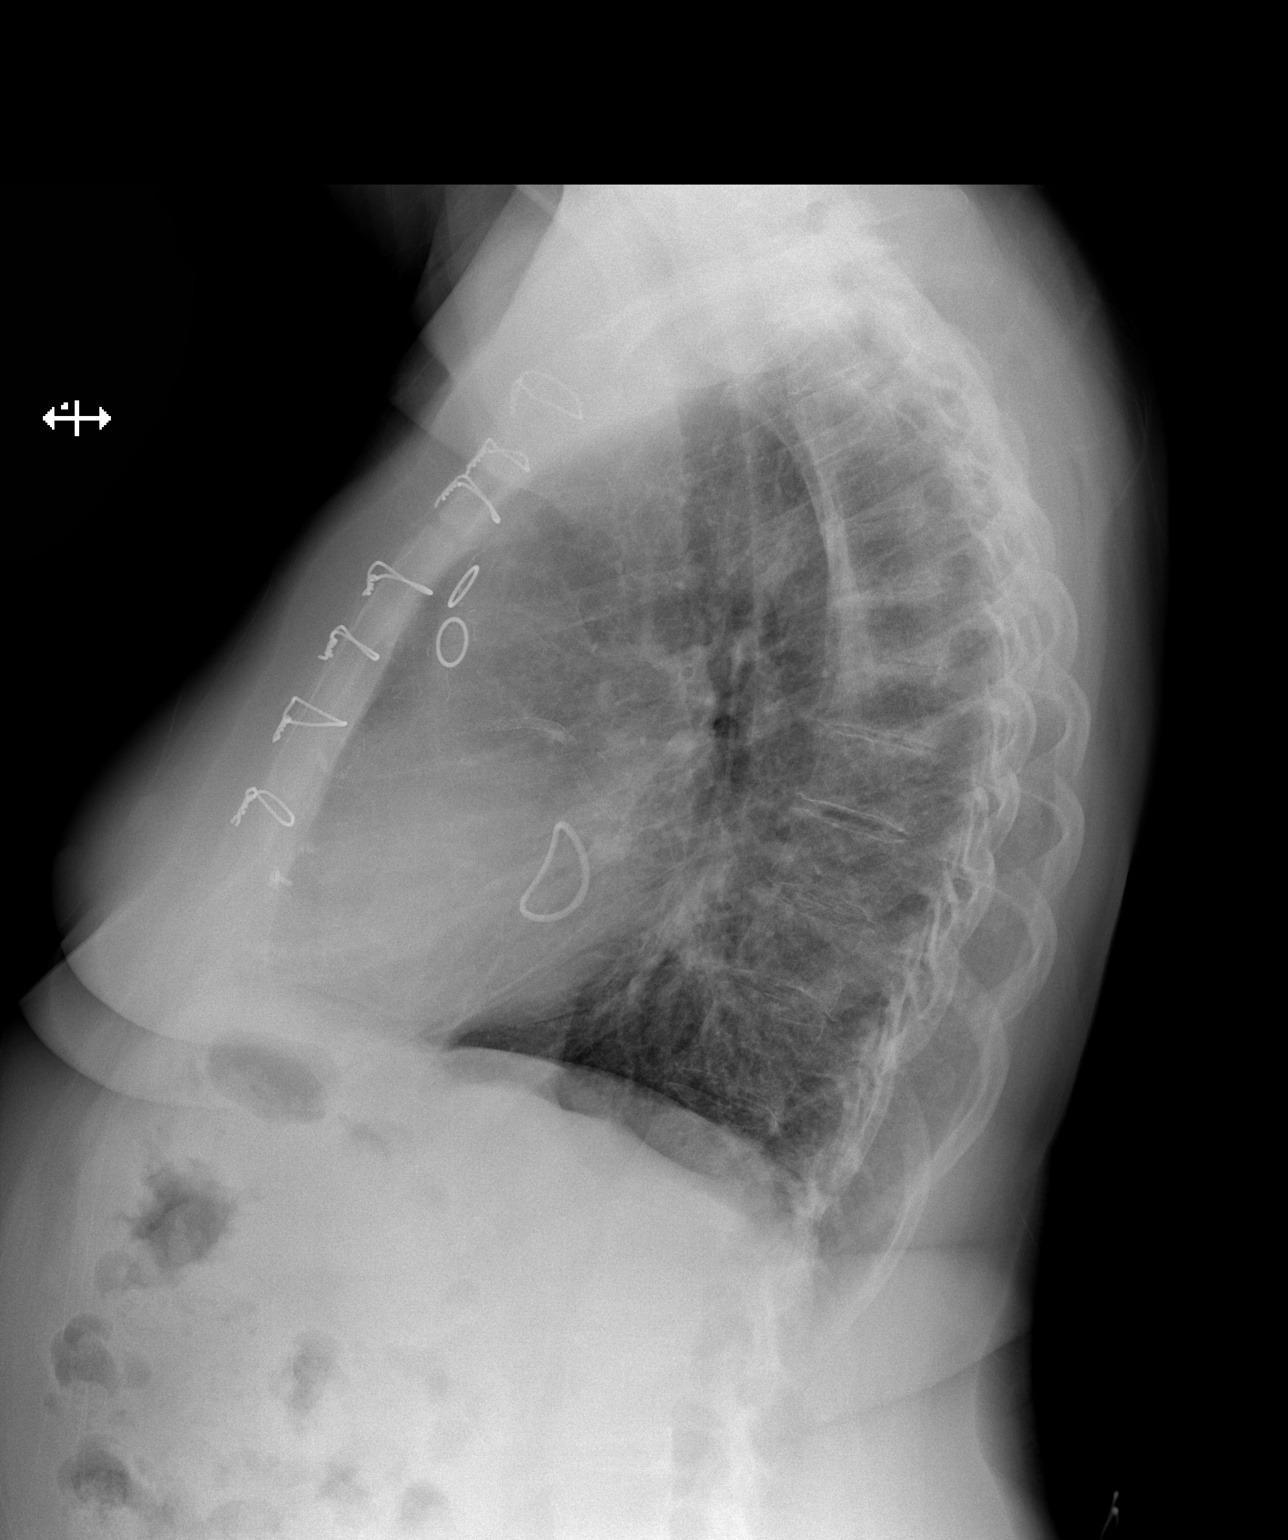

[2 of 2 positions shown; findings below may reference images not displayed]

PROCEDURE:     DXR - DXR CHEST PA (OR AP) AND LATERAL  - [DATE] [DATE]

RESULT:     Comparison is made to prior study dated [DATE].

There is prominence of the interstitial markings. No focal regions of
consolidation are appreciated. The patient is status post median sternotomy
and coronary artery bypass grafting as well as valvuloplasty. The cardiac
silhouette is within the upper limits of normal. The visualized bony
skeleton is unremarkable.
IMPRESSION: 1. Interstitial prominence likely representing a component of pulmonary
vascular congestion. No focal regions of consolidation are appreciated.

## 2011-06-29 ENCOUNTER — Encounter: Payer: Self-pay | Admitting: Internal Medicine

## 2011-07-14 ENCOUNTER — Ambulatory Visit (INDEPENDENT_AMBULATORY_CARE_PROVIDER_SITE_OTHER): Payer: PRIVATE HEALTH INSURANCE | Admitting: Cardiovascular Disease

## 2011-07-14 ENCOUNTER — Encounter: Payer: Self-pay | Admitting: Cardiovascular Disease

## 2011-07-14 DIAGNOSIS — E785 Hyperlipidemia, unspecified: Secondary | ICD-10-CM

## 2011-07-14 DIAGNOSIS — I739 Peripheral vascular disease, unspecified: Secondary | ICD-10-CM

## 2011-07-14 DIAGNOSIS — I25810 Atherosclerosis of coronary artery bypass graft(s) without angina pectoris: Secondary | ICD-10-CM

## 2011-07-14 DIAGNOSIS — R079 Chest pain, unspecified: Secondary | ICD-10-CM

## 2011-07-14 NOTE — Assessment & Plan Note (Signed)
Repeat history ultrasound later this year. History of left aortofemoral bypass.

## 2011-07-14 NOTE — Assessment & Plan Note (Signed)
Goal LDL less than 70. Continue statin 

## 2011-07-14 NOTE — Patient Instructions (Signed)
You are doing well. No medication changes were made.  Please call us if you have new issues that need to be addressed before your next appt.  Your physician wants you to follow-up in: 6 months.  You will receive a reminder letter in the mail two months in advance. If you don't receive a letter, please call our office to schedule the follow-up appointment.   

## 2011-07-14 NOTE — Progress Notes (Signed)
Patient ID: [name], female    DOB: [birth year], 66 y.o.   MRN: [id number]  HPI Comments: [name] is a pleasant 66 year old woman with a history of coronary artery disease, stent to her left circumflex and 2 stents to the LAD in March of 2011, aorto femoral bypass grafting in early October 2011,  with occlusion of her coronary stents in mid-October requiring bypass surgery at [facility] in mid to late October 2012. She presents for routine followup.   She reports having problems with her weight. Her weight is up 8 pounds over the past year. She frequently, does not exercise much. She has back problems and knee pain which limits her ability to exercise. She has frequent fatigue. She was recently seen in emergency room after a difficult work out with her physical trainer with elevated CK of 1400. She was given IV fluids and sent home. No further episodes of chest pain.  long history of smoking though stopped in March of 2011     ventricular arrhythmia when her stents were placed in the Cath Lab requiring shock.    echocardiogram improved, EF  greater than 40%.   Cardiac catheter report from July 16 1999  indicate she had a vision bare metal stent 2.75 x 28 mm in the LCX, mini vision bare metal stent 2.5 x 28 and 2.5 x 15 mm stent to the LAD.   EKG from [facility] 06/27/2011  shows  sinus rhythm with rate 67 beats per minute, nonspecific ST changes in leads V1 to V3.  Left axis deviation     Outpatient Encounter Prescriptions as of 07/14/2011  Medication Sig Dispense Refill  . ALPRAZolam (XANAX) 0.5 MG tablet Take 1 tablet (0.5 mg total) by mouth at bedtime as needed.  90 tablet  2  . aspirin 81 MG EC tablet Take 81 mg by mouth daily.        . Cholecalciferol (VITAMIN D3) 1000 UNITS CAPS Take 1,000 Units by mouth 1 day or 1 dose.        Marland Kitchen HYDROcodone-acetaminophen (LORTAB) 7.5-500 MG per tablet Take 1 tablet by mouth every 6 (six) hours as needed.        . metoprolol tartrate (LOPRESSOR) 25 MG tablet  Take 12.5 mg by mouth 2 (two) times daily.       . Omega-3 Fatty Acids (FISH OIL BURP-LESS) 1200 MG CAPS Take by mouth 1 dose over 46 hours.        . pantoprazole (PROTONIX) 40 MG tablet Take 1 tablet (40 mg total) by mouth daily.  90 tablet  1  . Probiotic Product (PROBIOTIC FORMULA) CAPS Take by mouth daily.      . rosuvastatin (CRESTOR) 40 MG tablet Take 1 tablet (40 mg total) by mouth daily.  30 tablet  6  . DISCONTD: Garlic 1000 MG CAPS Take by mouth daily.           Review of Systems  Constitutional: Positive for fatigue and unexpected weight change.  HENT: Negative.   Eyes: Negative.   Respiratory: Negative.   Gastrointestinal: Negative.   Musculoskeletal: Positive for back pain.  Skin: Negative.   Neurological: Negative.   Hematological: Negative.   Psychiatric/Behavioral: Negative.   All other systems reviewed and are negative.    BP 149/86  Pulse 83  Ht 5\' 2"  (1.575 m)  Wt 137 lb (62.143 kg)  BMI 25.06 kg/m2   Physical Exam  Nursing note and vitals reviewed. Constitutional: She is oriented to person,  place, and time. She appears well-developed and well-nourished.  HENT:  Head: Normocephalic.  Nose: Nose normal.  Mouth/Throat: Oropharynx is clear and moist.  Eyes: Conjunctivae are normal. Pupils are equal, round, and reactive to light.  Neck: Normal range of motion. Neck supple. No JVD present.  Cardiovascular: Normal rate, regular rhythm, S1 normal, S2 normal, normal heart sounds and intact distal pulses.  Exam reveals no gallop and no friction rub.   No murmur heard. Pulmonary/Chest: Effort normal and breath sounds normal. No respiratory distress. She has no wheezes. She has no rales. She exhibits no tenderness.  Abdominal: Soft. Bowel sounds are normal. She exhibits no distension. There is no tenderness.  Musculoskeletal: Normal range of motion. She exhibits no edema and no tenderness.  Lymphadenopathy:    She has no cervical adenopathy.  Neurological: She  is alert and oriented to person, place, and time. Coordination normal.  Skin: Skin is warm and dry. No rash noted. No erythema.  Psychiatric: She has a normal mood and affect. Her behavior is normal. Judgment and thought content normal.         Assessment and Plan

## 2011-07-14 NOTE — Assessment & Plan Note (Signed)
Currently with no symptoms of angina. No further workup at this time. Continue current medication regimen. 

## 2011-07-20 ENCOUNTER — Other Ambulatory Visit: Payer: Self-pay | Admitting: Internal Medicine

## 2011-07-20 ENCOUNTER — Other Ambulatory Visit: Payer: Self-pay | Admitting: *Deleted

## 2011-07-20 ENCOUNTER — Encounter: Payer: Self-pay | Admitting: Internal Medicine

## 2011-07-20 MED ORDER — METOPROLOL TARTRATE 25 MG PO TABS: 12.5000 mg | ORAL_TABLET | Freq: Two times a day (BID) | ORAL | Status: DC

## 2011-07-20 NOTE — Telephone Encounter (Signed)
ALREADY APPROVED on 4/1

## 2011-08-17 ENCOUNTER — Other Ambulatory Visit: Payer: Self-pay | Admitting: Cardiovascular Disease

## 2011-08-18 ENCOUNTER — Other Ambulatory Visit: Payer: Self-pay | Admitting: *Deleted

## 2011-08-18 MED ORDER — ROSUVASTATIN CALCIUM 40 MG PO TABS: 40.0000 mg | ORAL_TABLET | Freq: Every day | ORAL | Status: DC

## 2011-08-18 NOTE — Telephone Encounter (Signed)
Refilled Crestor. 

## 2011-09-01 ENCOUNTER — Telehealth: Payer: Self-pay | Admitting: Internal Medicine

## 2011-09-01 DIAGNOSIS — Z1239 Encounter for other screening for malignant neoplasm of breast: Secondary | ICD-10-CM

## 2011-09-01 NOTE — Telephone Encounter (Signed)
Order has been placed, so we can schedul

## 2011-09-01 NOTE — Telephone Encounter (Signed)
Pt called to schedule cpx and stated she needed to get a mammogram [facility] tues am

## 2011-09-04 ENCOUNTER — Encounter: Payer: Self-pay | Admitting: Internal Medicine

## 2011-09-09 LAB — HM MAMMOGRAPHY: HM Mammogram: NORMAL

## 2011-09-21 ENCOUNTER — Other Ambulatory Visit: Payer: Self-pay | Admitting: *Deleted

## 2011-09-21 MED ORDER — PANTOPRAZOLE SODIUM 40 MG PO TBEC: 40.0000 mg | DELAYED_RELEASE_TABLET | Freq: Every day | ORAL | Status: DC

## 2011-11-13 ENCOUNTER — Telehealth: Payer: Self-pay | Admitting: Internal Medicine

## 2011-11-13 NOTE — Telephone Encounter (Signed)
Pt came in today wanted to get an order for cpx labs  She had an appointment with dr walker on 8/1  She can get her labs done at [facility] for free there.  I explain to her that dr walker was not in the office till Monday and she wanted to know if dr Darrick Huntsman could write the order.  i also explained to her she could get her labs done after the appointment and they would fax results.  Pt stated she would rather do her labs prior to appointment please advise Fax # [phone] Phone # [phone]

## 2011-11-13 NOTE — Telephone Encounter (Signed)
Yes, she needs fastign lipids,  CMET , please print order  I will sign

## 2011-11-16 NOTE — Telephone Encounter (Signed)
Notify patient through my chart.

## 2011-11-16 NOTE — Telephone Encounter (Signed)
Orders faxed to [phone] patient advised via message left on cell phone voicemail.

## 2011-11-18 ENCOUNTER — Telehealth: Payer: Self-pay | Admitting: Internal Medicine

## 2011-11-18 NOTE — Telephone Encounter (Signed)
Lab work from 11/17/2011 showed elevation of calcium at 10.8 and slight elevation of ALT at 33.

## 2011-11-19 ENCOUNTER — Encounter: Payer: Self-pay | Admitting: Internal Medicine

## 2011-11-19 ENCOUNTER — Other Ambulatory Visit (HOSPITAL_COMMUNITY)
Admission: RE | Admit: 2011-11-19 | Discharge: 2011-11-19 | Disposition: A | Discharge status: Home / Self Care | Payer: PRIVATE HEALTH INSURANCE | Source: Ambulatory Visit | Attending: Internal Medicine | Admitting: Internal Medicine

## 2011-11-19 ENCOUNTER — Ambulatory Visit (INDEPENDENT_AMBULATORY_CARE_PROVIDER_SITE_OTHER): Payer: PRIVATE HEALTH INSURANCE | Admitting: Internal Medicine

## 2011-11-19 VITALS — BP 120/80 | HR 70 | Temp 98.3°F | Ht 62.0 in | Wt 134.2 lb

## 2011-11-19 DIAGNOSIS — Z0000 Encounter for general adult medical examination without abnormal findings: Secondary | ICD-10-CM

## 2011-11-19 DIAGNOSIS — Z01419 Encounter for gynecological examination (general) (routine) without abnormal findings: Secondary | ICD-10-CM | POA: Insufficient documentation

## 2011-11-19 DIAGNOSIS — Z1151 Encounter for screening for human papillomavirus (HPV): Secondary | ICD-10-CM | POA: Insufficient documentation

## 2011-11-19 DIAGNOSIS — G629 Polyneuropathy, unspecified: Secondary | ICD-10-CM | POA: Insufficient documentation

## 2011-11-19 DIAGNOSIS — Z1211 Encounter for screening for malignant neoplasm of colon: Secondary | ICD-10-CM

## 2011-11-19 DIAGNOSIS — G589 Mononeuropathy, unspecified: Secondary | ICD-10-CM

## 2011-11-19 LAB — HM PAP SMEAR: HM Pap smear: NEGATIVE

## 2011-11-19 MED ORDER — GABAPENTIN 300 MG PO CAPS: 300.0000 mg | ORAL_CAPSULE | Freq: Every day | ORAL | Status: DC

## 2011-11-19 NOTE — Assessment & Plan Note (Signed)
General medical exam including breast and pelvic exam are normal today except for noted atrophic vaginitis. Pap is pending. Reviewed recent lab work including lipids, CMP, and CBC. Lipids are at goal with LDL less than 70. CMP shows persistent elevation of calcium at 10.8. Patient recently had 24-hour urine for calcium collected by her endocrinologist. Will request results on this. We'll continue all current medications. Followup 6 months or sooner as needed.

## 2011-11-19 NOTE — Progress Notes (Signed)
Subjective:    Patient ID: [name], female    DOB: 07-14-1945, 66 y.o.   MRN: [id number]  HPI 65 year old female with history of hypertension, hyperlipidemia, coronary artery disease presents for annual exam. In general, she reports she has been feeling well. She reports normal energy level. She has been trying to follow a healthy diet and has increased her physical activity by working with a trainer and walking. She denies any chest pain, shortness of breath, or other concerns.  She notes that she did have some left lateral leg pain which was diagnosed as neuropathy and has been treated by pain management with Neurontin. She reports improvement in her symptoms with this medication.  Outpatient Encounter Prescriptions as of 11/19/2011  Medication Sig Dispense Refill  . ALPRAZolam (XANAX XR) 0.5 MG 24 hr tablet TAKE ONE  TABLET BY MOUTH EVERY DAY AS NEEDED  90 tablet  1  . ALPRAZolam (XANAX) 0.5 MG tablet Take 1 tablet (0.5 mg total) by mouth at bedtime as needed.  90 tablet  2  . aspirin 81 MG EC tablet Take 81 mg by mouth daily.        . Cholecalciferol (VITAMIN D3) 1000 UNITS CAPS Take 1,000 Units by mouth 1 day or 1 dose.        . Coenzyme Q10 100 MG capsule Take 100 mg by mouth 2 (two) times daily.      . CRESTOR 40 MG tablet TAKE ONE TABLET BY MOUTH DAILY  30 each  6  . gabapentin (NEURONTIN) 300 MG capsule Take 1 capsule (300 mg total) by mouth daily.  30 capsule  3  . HYDROcodone-acetaminophen (LORTAB) 7.5-500 MG per tablet Take 1 tablet by mouth every 6 (six) hours as needed.        . metoprolol tartrate (LOPRESSOR) 25 MG tablet Take 0.5 tablets (12.5 mg total) by mouth 2 (two) times daily.  90 tablet  1  . niacin (NIASPAN) 500 MG CR tablet Take 500 mg by mouth at bedtime.      . Omega-3 Fatty Acids (FISH OIL BURP-LESS) 1200 MG CAPS Take by mouth 1 dose over 46 hours.        . pantoprazole (PROTONIX) 40 MG tablet Take 1 tablet (40 mg total) by mouth daily.  90 tablet  3  .  Probiotic Product (PROBIOTIC FORMULA) CAPS Take by mouth daily.      . rosuvastatin (CRESTOR) 40 MG tablet Take 1 tablet (40 mg total) by mouth daily.  30 tablet  6  . DISCONTD: gabapentin (NEURONTIN) 300 MG capsule Take 300 mg by mouth daily.       BP 120/80  Pulse 70  Temp 98.3 F (36.8 C) (Oral)  Ht 5\' 2"  (1.575 m)  Wt 134 lb 4 oz (60.895 kg)  BMI 24.55 kg/m2  SpO2 98%  Review of Systems  Constitutional: Negative for fever, chills, appetite change, fatigue and unexpected weight change.  HENT: Negative for ear pain, congestion, sore throat, trouble swallowing, neck pain, voice change and sinus pressure.   Eyes: Negative for visual disturbance.  Respiratory: Negative for cough, shortness of breath, wheezing and stridor.   Cardiovascular: Negative for chest pain, palpitations and leg swelling.  Gastrointestinal: Negative for nausea, vomiting, abdominal pain, diarrhea, constipation, blood in stool, abdominal distention and anal bleeding.  Genitourinary: Negative for dysuria and flank pain.  Musculoskeletal: Negative for myalgias, arthralgias and gait problem.  Skin: Negative for color change and rash.  Neurological: Negative for dizziness and headaches.  Hematological: Negative for adenopathy. Does not bruise/bleed easily.  Psychiatric/Behavioral: Negative for suicidal ideas, disturbed wake/sleep cycle and dysphoric mood. The patient is not nervous/anxious.        Objective:   Physical Exam  Constitutional: She is oriented to person, place, and time. She appears well-developed and well-nourished. No distress.  HENT:  Head: Normocephalic and atraumatic.  Right Ear: External ear normal.  Left Ear: External ear normal.  Nose: Nose normal.  Mouth/Throat: Oropharynx is clear and moist. No oropharyngeal exudate.  Eyes: Conjunctivae are normal. Pupils are equal, round, and reactive to light. Right eye exhibits no discharge. Left eye exhibits no discharge. No scleral icterus.  Neck:  Normal range of motion. Neck supple. No tracheal deviation present. No thyromegaly present.  Cardiovascular: Normal rate, regular rhythm, normal heart sounds and intact distal pulses.  Exam reveals no gallop and no friction rub.   No murmur heard. Pulmonary/Chest: Effort normal and breath sounds normal. No respiratory distress. She has no wheezes. She has no rales. She exhibits no tenderness.    Abdominal: Soft. Bowel sounds are normal. She exhibits no distension and no mass. There is no tenderness. There is no rebound and no guarding.  Genitourinary: Rectum normal and uterus normal. No breast swelling, tenderness, discharge or bleeding. Pelvic exam was performed with patient supine. There is no rash, tenderness or lesion on the right labia. There is no rash, tenderness or lesion on the left labia. Uterus is not enlarged and not tender. Cervix exhibits no motion tenderness, no discharge and no friability. Right adnexum displays no mass, no tenderness and no fullness. Left adnexum displays no mass, no tenderness and no fullness. There is erythema and bleeding (atrophic vaginitis with friability) around the vagina. No tenderness around the vagina. No vaginal discharge found.  Musculoskeletal: Normal range of motion. She exhibits no edema and no tenderness.  Lymphadenopathy:    She has no cervical adenopathy.  Neurological: She is alert and oriented to person, place, and time. No cranial nerve deficit. She exhibits normal muscle tone. Coordination normal.  Skin: Skin is warm and dry. No rash noted. She is not diaphoretic. No erythema. No pallor.  Psychiatric: She has a normal mood and affect. Her behavior is normal. Judgment and thought content normal.          Assessment & Plan:

## 2011-11-19 NOTE — Assessment & Plan Note (Signed)
Patient reports some left lateral leg pain which was evaluated by pain management physician and diagnosed as neuropathy. We'll request results on this. We'll continue Neurontin as this is working well for her.

## 2011-11-25 ENCOUNTER — Encounter: Payer: Self-pay | Admitting: Internal Medicine

## 2011-12-01 ENCOUNTER — Encounter: Payer: Self-pay | Admitting: Internal Medicine

## 2011-12-21 ENCOUNTER — Encounter: Payer: Self-pay | Admitting: Internal Medicine

## 2011-12-22 ENCOUNTER — Ambulatory Visit (INDEPENDENT_AMBULATORY_CARE_PROVIDER_SITE_OTHER): Payer: PRIVATE HEALTH INSURANCE | Admitting: Cardiovascular Disease

## 2011-12-22 ENCOUNTER — Encounter: Payer: Self-pay | Admitting: Cardiovascular Disease

## 2011-12-22 ENCOUNTER — Other Ambulatory Visit: Payer: Self-pay | Admitting: *Deleted

## 2011-12-22 VITALS — BP 138/70 | HR 63 | Ht 60.0 in | Wt 134.8 lb

## 2011-12-22 DIAGNOSIS — I739 Peripheral vascular disease, unspecified: Secondary | ICD-10-CM

## 2011-12-22 DIAGNOSIS — I25810 Atherosclerosis of coronary artery bypass graft(s) without angina pectoris: Secondary | ICD-10-CM

## 2011-12-22 DIAGNOSIS — E785 Hyperlipidemia, unspecified: Secondary | ICD-10-CM

## 2011-12-22 DIAGNOSIS — I658 Occlusion and stenosis of other precerebral arteries: Secondary | ICD-10-CM

## 2011-12-22 DIAGNOSIS — Z9889 Other specified postprocedural states: Secondary | ICD-10-CM

## 2011-12-22 DIAGNOSIS — Z95828 Presence of other vascular implants and grafts: Secondary | ICD-10-CM

## 2011-12-22 MED ORDER — ALPRAZOLAM ER 0.5 MG PO TB24: 0.5000 mg | ORAL_TABLET | Freq: Every evening | ORAL | Status: DC | PRN

## 2011-12-22 NOTE — Assessment & Plan Note (Signed)
LDL at goal, total cholesterol mildly elevated. We have suggested she work on her diet, add low grade exercise.

## 2011-12-22 NOTE — Progress Notes (Signed)
Patient ID: [name], female    DOB: [birth year], 66 y.o.   MRN: [id number]  HPI Comments: [name] is a pleasant 66 year old woman with a history of coronary artery disease, stent to her left circumflex and 2 stents to the LAD in March of 2011, aorto femoral bypass grafting in early October 2011,  with occlusion of her coronary stents in mid-October requiring bypass surgery at [facility] in mid to late October 2012. She presents for routine followup.   She reports having problems with her weight. She  does not exercise much secondary to back problems and knee pain. She has frequent fatigue.  No further episodes of chest pain. long history of smoking though stopped in March of 2011     ventricular arrhythmia when her stents were placed in the Cath Lab requiring shock.    echocardiogram improved, EF  greater than 40%.   Cardiac catheter report from July 16 1999  indicate she had a vision bare metal stent 2.75 x 28 mm in the LCX, mini vision bare metal stent 2.5 x 28 and 2.5 x 15 mm stent to the LAD.  Recent total cholesterol 168, LDL 60   EKG from [facility] 06/27/2011  shows  sinus rhythm with rate 67 beats per minute, nonspecific ST changes in leads V1 to V3.  Left axis deviation  EKG today shows normal sinus rhythm with rate 63 beats per minute with poor R-wave progression to the anterior precordial leads, nonspecific ST abnormality   Outpatient Encounter Prescriptions as of 12/22/2011  Medication Sig Dispense Refill  . ALPRAZolam (XANAX XR) 0.5 MG 24 hr tablet Take 1 tablet (0.5 mg total) by mouth at bedtime as needed.  90 tablet  1  . aspirin 81 MG EC tablet Take 81 mg by mouth daily.        . Cholecalciferol (VITAMIN D3) 1000 UNITS CAPS Take 1,000 Units by mouth 1 day or 1 dose.        . Coenzyme Q10 100 MG capsule Take 100 mg by mouth 2 (two) times daily.      . CRESTOR 40 MG tablet TAKE ONE TABLET BY MOUTH DAILY  30 each  6  . gabapentin (NEURONTIN) 300 MG capsule Take 1 capsule (300 mg  total) by mouth daily.  30 capsule  3  . HYDROcodone-acetaminophen (LORTAB) 7.5-500 MG per tablet Take 1 tablet by mouth every 6 (six) hours as needed.        . metoprolol tartrate (LOPRESSOR) 25 MG tablet Take 0.5 tablets (12.5 mg total) by mouth 2 (two) times daily.  90 tablet  1  . niacin (NIASPAN) 500 MG CR tablet Take 500 mg by mouth at bedtime.      . Omega-3 Fatty Acids (FISH OIL BURP-LESS) 1200 MG CAPS Take by mouth 1 dose over 46 hours.        . pantoprazole (PROTONIX) 40 MG tablet Take 1 tablet (40 mg total) by mouth daily.  90 tablet  3  . Probiotic Product (PROBIOTIC FORMULA) CAPS Take by mouth daily.      . rosuvastatin (CRESTOR) 40 MG tablet Take 1 tablet (40 mg total) by mouth daily.  30 tablet  6  . DISCONTD: ALPRAZolam (XANAX) 0.5 MG tablet Take 1 tablet (0.5 mg total) by mouth at bedtime as needed.  90 tablet  2     Review of Systems  HENT: Negative.   Eyes: Negative.   Respiratory: Negative.   Gastrointestinal: Negative.   Musculoskeletal: Positive  for back pain.       Leg pain  Skin: Negative.   Neurological: Negative.   Hematological: Negative.   Psychiatric/Behavioral: Negative.   All other systems reviewed and are negative.    BP 138/70  Pulse 63  Ht 5' (1.524 m)  Wt 134 lb 12 oz (61.122 kg)  BMI 26.32 kg/m2  Physical Exam  Nursing note and vitals reviewed. Constitutional: She is oriented to person, place, and time. She appears well-developed and well-nourished.  HENT:  Head: Normocephalic.  Nose: Nose normal.  Mouth/Throat: Oropharynx is clear and moist.  Eyes: Conjunctivae are normal. Pupils are equal, round, and reactive to light.  Neck: Normal range of motion. Neck supple. No JVD present.  Cardiovascular: Normal rate, regular rhythm, S1 normal, S2 normal, normal heart sounds and intact distal pulses.  Exam reveals no gallop and no friction rub.   No murmur heard. Pulmonary/Chest: Effort normal and breath sounds normal. No respiratory distress.  She has no wheezes. She has no rales. She exhibits no tenderness.  Abdominal: Soft. Bowel sounds are normal. She exhibits no distension. There is no tenderness.  Musculoskeletal: Normal range of motion. She exhibits no edema and no tenderness.  Lymphadenopathy:    She has no cervical adenopathy.  Neurological: She is alert and oriented to person, place, and time. Coordination normal.  Skin: Skin is warm and dry. No rash noted. No erythema.  Psychiatric: She has a normal mood and affect. Her behavior is normal. Judgment and thought content normal.         Assessment and Plan

## 2011-12-22 NOTE — Patient Instructions (Addendum)
You are doing well. No medication changes were made.  We will schedule you for ultrasound of your aorta graft, also with ABIs  Please call us if you have new issues that need to be addressed before your next appt.  Your physician wants you to follow-up in: 6 months.  You will receive a reminder letter in the mail two months in advance. If you don't receive a letter, please call our office to schedule the follow-up appointment.

## 2011-12-22 NOTE — Assessment & Plan Note (Signed)
Currently with no symptoms of angina. No further workup at this time. Continue current medication regimen. 

## 2011-12-22 NOTE — Telephone Encounter (Signed)
Rx for Alprazolam called to [facility].

## 2011-12-22 NOTE — Assessment & Plan Note (Signed)
We will schedule her for a ultrasound of her aorta bypass and ABIs

## 2011-12-31 ENCOUNTER — Encounter (INDEPENDENT_AMBULATORY_CARE_PROVIDER_SITE_OTHER): Payer: PRIVATE HEALTH INSURANCE

## 2011-12-31 DIAGNOSIS — Z95828 Presence of other vascular implants and grafts: Secondary | ICD-10-CM

## 2011-12-31 DIAGNOSIS — I739 Peripheral vascular disease, unspecified: Secondary | ICD-10-CM

## 2012-01-31 LAB — CBC
MCH: 32.4 pg (ref 26.0–34.0)
MCHC: 32.7 g/dL (ref 32.0–36.0)
RBC: 4.14 10*6/uL (ref 3.80–5.20)
RDW: 13.9 % (ref 11.5–14.5)
WBC: 9.7 10*3/uL (ref 3.6–11.0)

## 2012-01-31 LAB — BASIC METABOLIC PANEL
Anion Gap: 8 (ref 7–16)
BUN: 21 mg/dL — ABNORMAL HIGH (ref 7–18)
Chloride: 106 mmol/L (ref 98–107)
Creatinine: 0.97 mg/dL (ref 0.60–1.30)
EGFR (African American): >60
EGFR (Non-African Amer.): >60
Glucose: 109 mg/dL — ABNORMAL HIGH (ref 65–99)
Osmolality: 283 (ref 275–301)
Sodium: 140 mmol/L (ref 136–145)

## 2012-01-31 LAB — TROPONIN I: Troponin-I: <0.02 ng/mL

## 2012-01-31 LAB — CK TOTAL AND CKMB (NOT AT ARMC): CK, Total: 51 U/L (ref 21–215)

## 2012-01-31 IMAGING — CR DG CHEST 2V
1 series · 2 of 2 positions shown · non-contrast
Comparison: none

REASON FOR EXAM: chest pain
COMMENTS:

PROCEDURE:     DXR - DXR CHEST PA (OR AP) AND LATERAL  - [DATE]  [DATE]
RESULT:     Comparison is made to the study dated [DATE].
CABG changes are present. Prosthetic heart valve is present. The heart is
borderline enlarged. There is no edema, infiltrate, effusion or pneumothorax

[Series 1: w chest pa · 0.14mm/px · 2 of 2 slices shown]
[im 1/2]
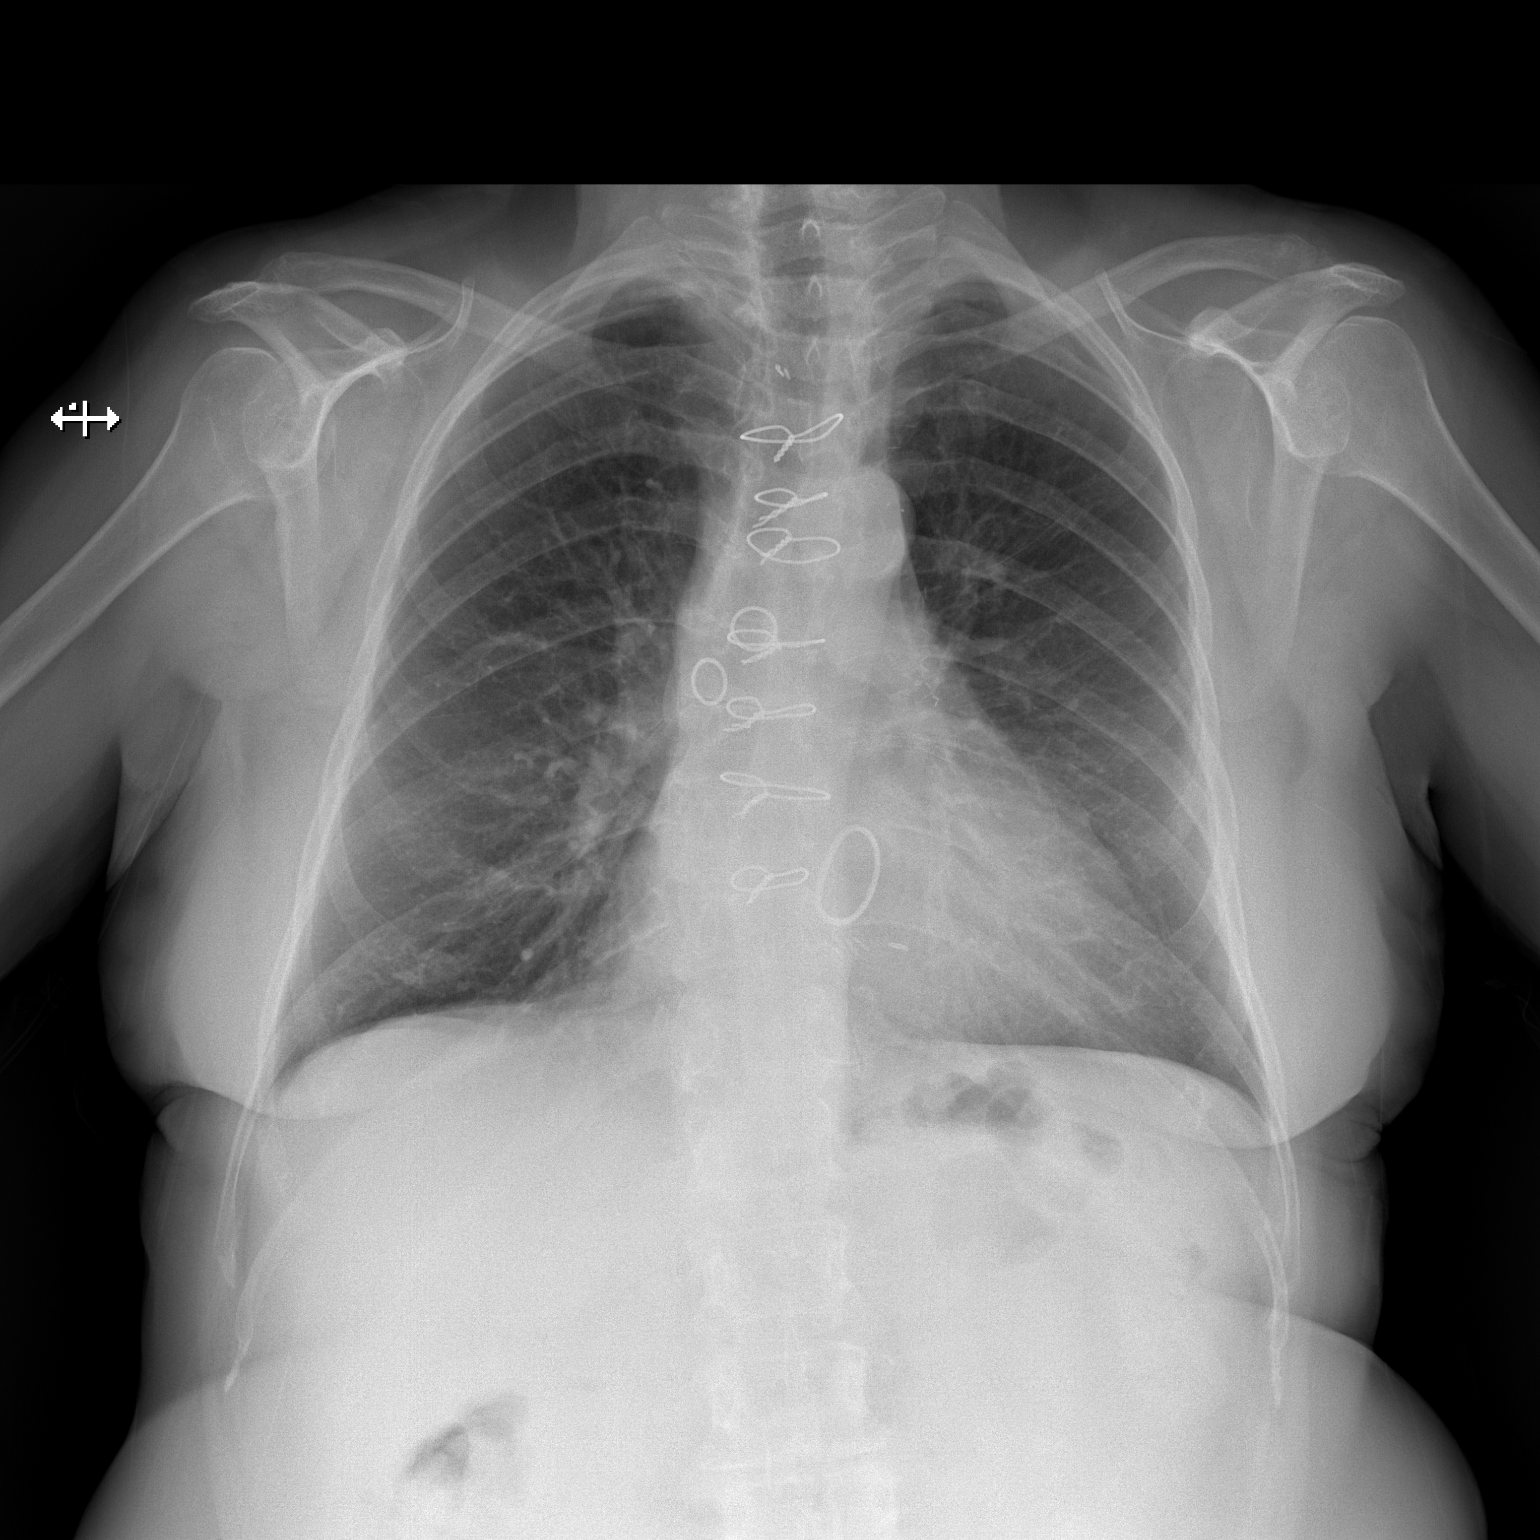
[im 2/2]
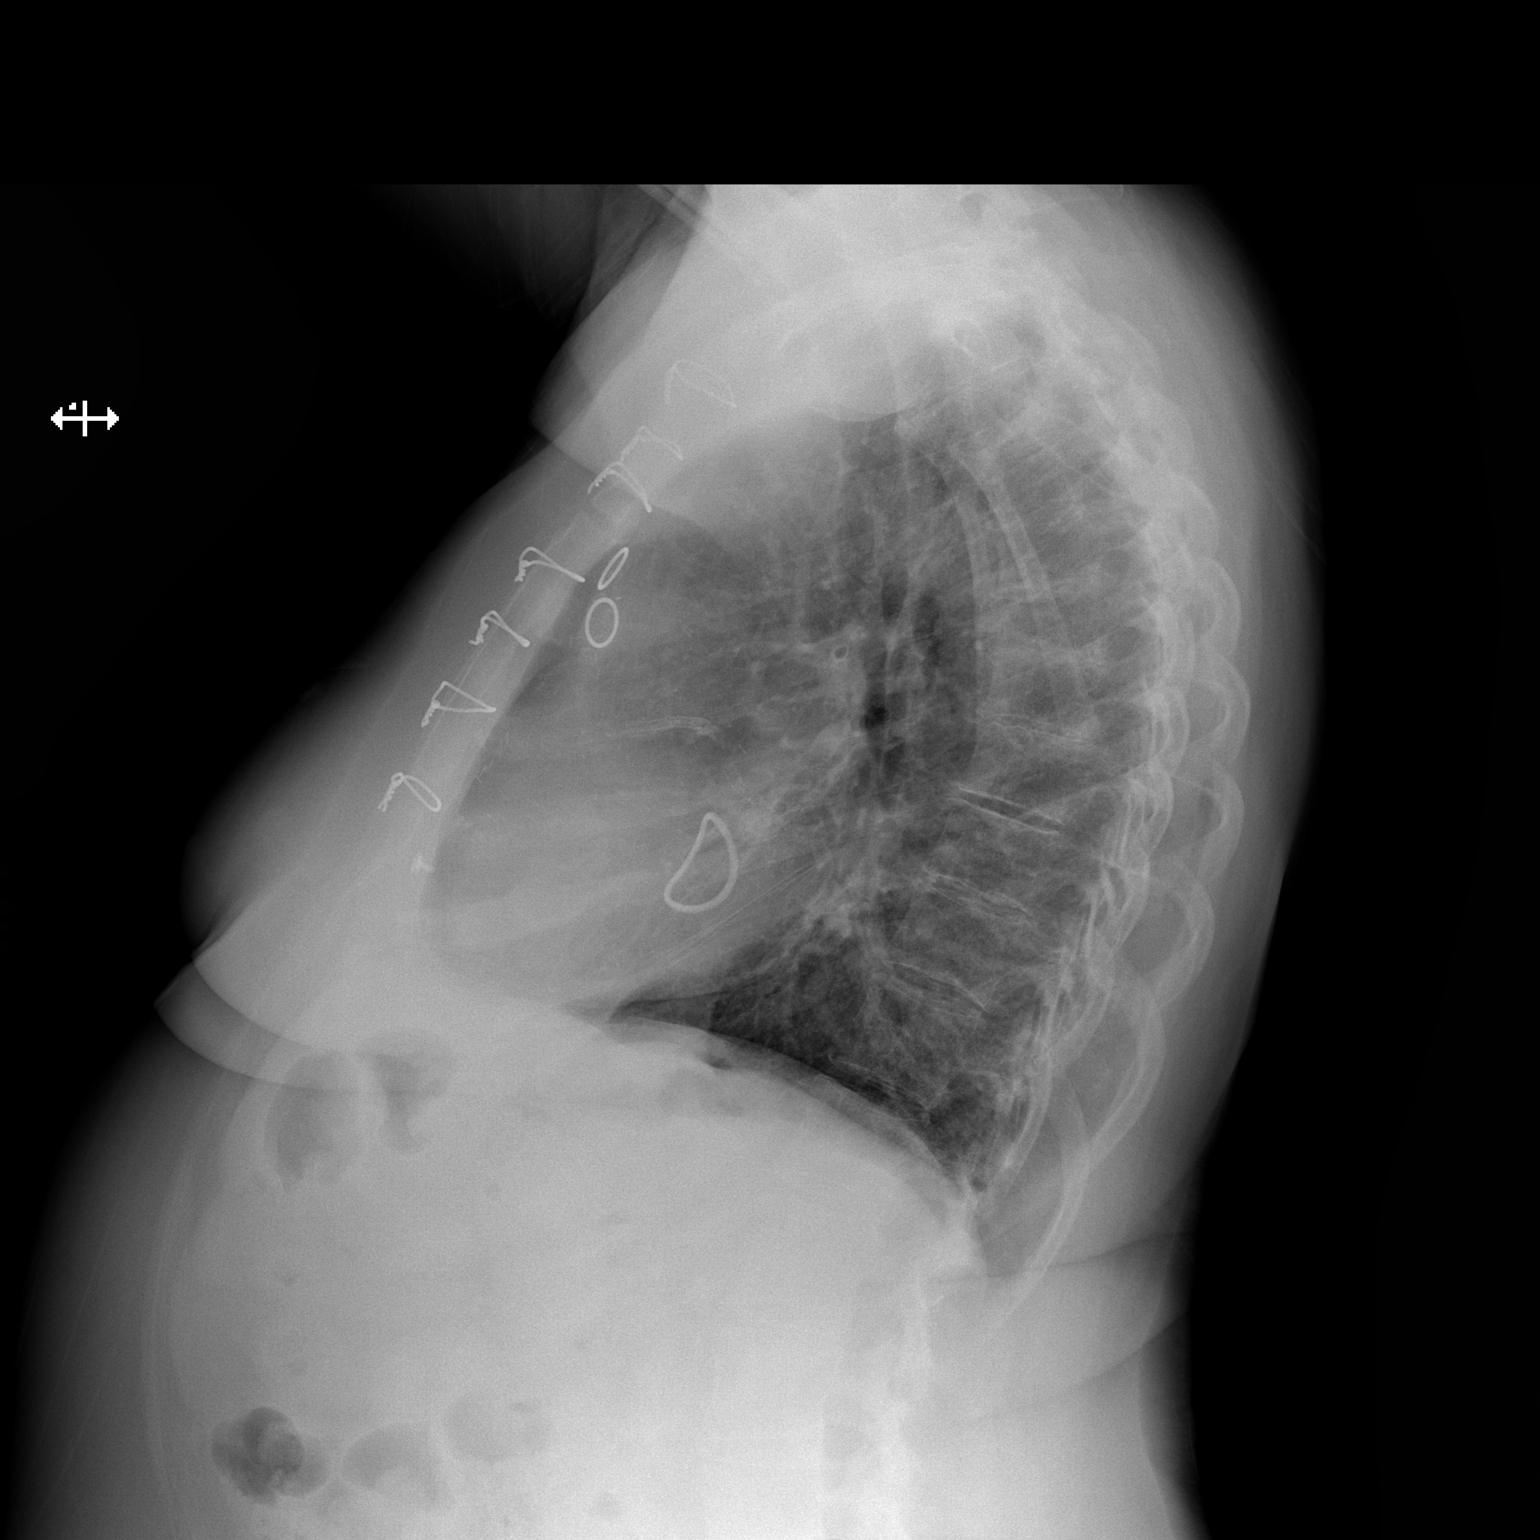

[2 of 2 positions shown; findings below may reference images not displayed]

IMPRESSION: 1. Postoperative changes. No acute cardiopulmonary disease evident.

[REDACTED]

## 2012-02-01 ENCOUNTER — Inpatient Hospital Stay: Payer: Self-pay | Admitting: Internal Medicine

## 2012-02-01 DIAGNOSIS — R079 Chest pain, unspecified: Secondary | ICD-10-CM

## 2012-02-01 LAB — TROPONIN I
Troponin-I: <0.02 ng/mL
Troponin-I: <0.02 ng/mL

## 2012-02-02 ENCOUNTER — Telehealth: Payer: Self-pay

## 2012-02-02 DIAGNOSIS — I2510 Atherosclerotic heart disease of native coronary artery without angina pectoris: Secondary | ICD-10-CM

## 2012-02-02 LAB — BASIC METABOLIC PANEL
Creatinine: 0.85 mg/dL (ref 0.60–1.30)
EGFR (African American): >60
Glucose: 100 mg/dL — ABNORMAL HIGH (ref 65–99)
Osmolality: 288 (ref 275–301)
Sodium: 144 mmol/L (ref 136–145)

## 2012-02-02 LAB — CBC
HGB: 12.7 g/dL (ref 12.0–16.0)
MCH: 34.2 pg — ABNORMAL HIGH (ref 26.0–34.0)
MCHC: 34.7 g/dL (ref 32.0–36.0)
Platelet: 196 10*3/uL (ref 150–440)
RBC: 3.71 10*6/uL — ABNORMAL LOW (ref 3.80–5.20)
RDW: 13.9 % (ref 11.5–14.5)

## 2012-02-02 LAB — PROTIME-INR: Prothrombin Time: 12.5 secs (ref 11.5–14.7)

## 2012-02-02 NOTE — Telephone Encounter (Signed)
Message copied by Marcelle Overlie on Tue Feb 02, 2012 10:21 AM ------      Message from: Oneida Arenas      Created: Tue Feb 02, 2012 10:18 AM      Regarding: TCM PATIENT       PT BEING DISCHARGED TODAY

## 2012-02-03 ENCOUNTER — Telehealth: Payer: Self-pay | Admitting: Internal Medicine

## 2012-02-03 LAB — CBC WITH DIFFERENTIAL/PLATELET
Basophil #: 0.1 10*3/uL (ref 0.0–0.1)
Basophil %: 1 %
Eosinophil #: 0.2 10*3/uL (ref 0.0–0.7)
Eosinophil %: 2.8 %
HGB: 12.2 g/dL (ref 12.0–16.0)
Lymphocyte #: 2.3 10*3/uL (ref 1.0–3.6)
MCH: 33.5 pg (ref 26.0–34.0)
MCHC: 33.9 g/dL (ref 32.0–36.0)
MCV: 99 fL (ref 80–100)
Monocyte #: 0.9 x10 3/mm (ref 0.2–0.9)
Neutrophil %: 47.4 %
Platelet: 192 10*3/uL (ref 150–440)
RBC: 3.64 10*6/uL — ABNORMAL LOW (ref 3.80–5.20)

## 2012-02-03 NOTE — Telephone Encounter (Signed)
[facility] hospital follow scheduled for 11/7 discharged 10/16

## 2012-02-03 NOTE — Telephone Encounter (Signed)
TCM call; per [facility] notes, pt being d/c home this am. Will call back 02/04/12

## 2012-02-04 NOTE — Telephone Encounter (Signed)
TCM call Pt was admitted to [facility] earlier this week with c/o CP Extensive cardiac hx, cabg and stents Says she presented to ER with ongoing heartburn that would not go away Since d/c she denies further spells of CP, heartburn or any other symptoms.   She says she feels well and has all meds she needs No med changes were made in hospital and she confirms compliance Aware of hosp f/u appt 10/25 but asks that this be moved to a tues/thursday appt at a later week d/t work schedule conflicts First avail tues appt with Dr. Mariah Milling is 11/5. Appt moved to 11/5 but she will call us sooner if she has any questions/concerns.

## 2012-02-04 NOTE — Telephone Encounter (Signed)
Left message on cell phone voicemail for patient to return call. 

## 2012-02-08 ENCOUNTER — Encounter: Payer: Self-pay | Admitting: Cardiovascular Disease

## 2012-02-10 NOTE — Telephone Encounter (Signed)
Patient returned call stated that she is doing well per Erie Noe

## 2012-02-10 NOTE — Telephone Encounter (Signed)
Left message on home phone for patient to return call.

## 2012-02-12 ENCOUNTER — Encounter: Payer: PRIVATE HEALTH INSURANCE | Admitting: Cardiovascular Disease

## 2012-02-19 ENCOUNTER — Other Ambulatory Visit: Payer: Self-pay | Admitting: Internal Medicine

## 2012-02-22 ENCOUNTER — Encounter: Payer: Self-pay | Admitting: *Deleted

## 2012-02-23 ENCOUNTER — Encounter: Payer: Self-pay | Admitting: Cardiovascular Disease

## 2012-02-23 ENCOUNTER — Ambulatory Visit (INDEPENDENT_AMBULATORY_CARE_PROVIDER_SITE_OTHER): Payer: PRIVATE HEALTH INSURANCE | Admitting: Cardiovascular Disease

## 2012-02-23 VITALS — BP 118/70 | HR 72 | Resp 20 | Ht 60.0 in | Wt 138.8 lb

## 2012-02-23 DIAGNOSIS — I25810 Atherosclerosis of coronary artery bypass graft(s) without angina pectoris: Secondary | ICD-10-CM

## 2012-02-23 DIAGNOSIS — E785 Hyperlipidemia, unspecified: Secondary | ICD-10-CM

## 2012-02-23 DIAGNOSIS — I739 Peripheral vascular disease, unspecified: Secondary | ICD-10-CM

## 2012-02-23 MED ORDER — METOPROLOL TARTRATE 25 MG PO TABS: 12.5000 mg | ORAL_TABLET | Freq: Two times a day (BID) | ORAL | Status: DC

## 2012-02-23 NOTE — Assessment & Plan Note (Signed)
Cholesterol is at goal on the current lipid regimen. No changes to the medications were made.  

## 2012-02-23 NOTE — Progress Notes (Signed)
Patient ID: [name], female    DOB: 05/29/1945, 66 y.o.   MRN: [id number]  HPI Comments: [name] is a pleasant 66 year old woman with a history of coronary artery disease, stent to her left circumflex and 2 stents to the LAD in March of 2011, aorto femoral bypass grafting in early October 2011,  with occlusion of her coronary stents in mid-October requiring bypass surgery at [facility] in mid to late October 2012. She presents for routine followup.    long history of smoking though stopped in March of 2011 Recent plastic surgery on her face which went well Admission to the hospital October 14 for heartburn type symptoms. She had abnormal looking stress test and had cardiac catheterization that showed patent grafts. Work up performed by Dr. Kirke Corin. She is disappointed in her long wait in the emergency room, the bill that she has received and the fact that she needed a cardiac catheterization. She was discharged on proton pump inhibitor and has had no further symptoms.  Details of the cardiac catheterization are that she has a LIMA to the LAD, vein graft to the left circumflex, vein graft to the PDA which is occluded that supplies a very small area. Left main has 50% disease, ostial LAD has 95% in-stent restenosis, mid LAD has 100% disease, 75% ostial circumflex, 100% mid circumflex, very small OM1, 30% RCA disease, ejection fraction 45%  Stress test shows suboptimal images due to GI activity on resting images, significant anterior wall ischemia in the mid to distal segments, reduced ejection fraction of 47%     Prior ventricular arrhythmia when her stents were placed in the Cath Lab requiring shock.    echocardiogram improved, EF  greater than 40%.   Cardiac catheter report from July 15 2009  indicate she had a vision bare metal stent 2.75 x 28 mm in the LCX, mini vision bare metal stent 2.5 x 28 and 2.5 x 15 mm stent to the LAD.  She did not want an EKG today   Outpatient Encounter  Prescriptions as of 02/23/2012  Medication Sig Dispense Refill  . ALPRAZolam (XANAX XR) 0.5 MG 24 hr tablet Take 1 tablet (0.5 mg total) by mouth at bedtime as needed.  90 tablet  1  . aspirin 81 MG EC tablet Take 81 mg by mouth daily.        . Cholecalciferol (VITAMIN D3) 1000 UNITS CAPS Take 1,000 Units by mouth 1 day or 1 dose.        . Coenzyme Q10 100 MG capsule Take 100 mg by mouth 2 (two) times daily.      Marland Kitchen gabapentin (NEURONTIN) 300 MG capsule Take 1 capsule (300 mg total) by mouth daily.  30 capsule  3  . HYDROcodone-acetaminophen (LORTAB) 7.5-500 MG per tablet Take 1 tablet by mouth every 6 (six) hours as needed.        . metoprolol tartrate (LOPRESSOR) 25 MG tablet TAKE ONE-HALF TABLET BY MOUTH TWICE DAILY  90 tablet  0  . Omega-3 Fatty Acids (FISH OIL BURP-LESS) 1200 MG CAPS Take by mouth 1 dose over 46 hours.        . pantoprazole (PROTONIX) 40 MG tablet Take 1 tablet (40 mg total) by mouth daily.  90 tablet  3  . Probiotic Product (PROBIOTIC FORMULA) CAPS Take by mouth daily.      . rosuvastatin (CRESTOR) 40 MG tablet Take 1 tablet (40 mg total) by mouth daily.  30 tablet  6  .  niacin (NIASPAN) 500 MG CR tablet Take 500 mg by mouth at bedtime.      . [DISCONTINUED] CRESTOR 40 MG tablet TAKE ONE TABLET BY MOUTH DAILY  30 each  6     Review of Systems  Constitutional: Negative.   HENT: Negative.   Eyes: Negative.   Respiratory: Negative.   Cardiovascular: Negative.   Gastrointestinal: Negative.   Musculoskeletal: Positive for back pain.       Leg pain  Skin: Negative.   Neurological: Negative.   Hematological: Negative.   Psychiatric/Behavioral: Negative.   All other systems reviewed and are negative.    BP 118/70  Pulse 72  Resp 20  Ht 5' (1.524 m)  Wt 138 lb 12 oz (62.937 kg)  BMI 27.10 kg/m2  Physical Exam  Nursing note and vitals reviewed. Constitutional: She is oriented to person, place, and time. She appears well-developed and well-nourished.  HENT:    Head: Normocephalic.  Nose: Nose normal.  Mouth/Throat: Oropharynx is clear and moist.  Eyes: Conjunctivae normal are normal. Pupils are equal, round, and reactive to light.  Neck: Normal range of motion. Neck supple. No JVD present.  Cardiovascular: Normal rate, regular rhythm, S1 normal, S2 normal, normal heart sounds and intact distal pulses.  Exam reveals no gallop and no friction rub.   No murmur heard. Pulmonary/Chest: Effort normal and breath sounds normal. No respiratory distress. She has no wheezes. She has no rales. She exhibits no tenderness.  Abdominal: Soft. Bowel sounds are normal. She exhibits no distension. There is no tenderness.  Musculoskeletal: Normal range of motion. She exhibits no edema and no tenderness.  Lymphadenopathy:    She has no cervical adenopathy.  Neurological: She is alert and oriented to person, place, and time. Coordination normal.  Skin: Skin is warm and dry. No rash noted. No erythema.  Psychiatric: She has a normal mood and affect. Her behavior is normal. Judgment and thought content normal.         Assessment and Plan

## 2012-02-23 NOTE — Assessment & Plan Note (Signed)
Recent cardiac catheterization showing patent grafts with occluded vein graft to the RCA. Medical management recommended. Cause of her chest pain was likely noncardiac or secondary to small vessel disease. If she has any worsening symptoms, we could try long-acting nitrates or ranexa.

## 2012-02-23 NOTE — Patient Instructions (Addendum)
You are doing well. No medication changes were made.  Please call us if you have new issues that need to be addressed before your next appt.  Your physician wants you to follow-up in: 6 months.  You will receive a reminder letter in the mail two months in advance. If you don't receive a letter, please call our office to schedule the follow-up appointment.   

## 2012-02-23 NOTE — Assessment & Plan Note (Signed)
Recent aorto fem bypass graft ultrasound showing patent graft

## 2012-02-25 ENCOUNTER — Ambulatory Visit: Payer: PRIVATE HEALTH INSURANCE | Admitting: Internal Medicine

## 2012-03-03 ENCOUNTER — Encounter: Payer: Self-pay | Admitting: Cardiovascular Disease

## 2012-03-19 ENCOUNTER — Other Ambulatory Visit: Payer: Self-pay | Admitting: Internal Medicine

## 2012-04-16 ENCOUNTER — Other Ambulatory Visit: Payer: Self-pay | Admitting: Cardiovascular Disease

## 2012-04-16 ENCOUNTER — Other Ambulatory Visit: Payer: Self-pay | Admitting: Internal Medicine

## 2012-05-15 ENCOUNTER — Other Ambulatory Visit: Payer: Self-pay | Admitting: Internal Medicine

## 2012-05-16 ENCOUNTER — Encounter: Payer: Self-pay | Admitting: Internal Medicine

## 2012-05-16 DIAGNOSIS — Z1211 Encounter for screening for malignant neoplasm of colon: Secondary | ICD-10-CM

## 2012-05-16 NOTE — Telephone Encounter (Signed)
Received refill electronically. Last office visit 11/19/11. Is it okay to refill medication?

## 2012-05-17 ENCOUNTER — Telehealth: Payer: Self-pay | Admitting: Internal Medicine

## 2012-05-17 ENCOUNTER — Encounter: Payer: Self-pay | Admitting: Internal Medicine

## 2012-05-17 NOTE — Telephone Encounter (Signed)
I put in a referral for colonoscopy. Amber, can you schedule?

## 2012-05-17 NOTE — Telephone Encounter (Signed)
Appointment Request From: Georga Hacking Iorio      With Provider: Shelia Media, MD [facility] [place] [facility]]      Preferred Date Range: Any date 05/16/2012 or later      Preferred Times: Any      Reason: To address the following health maintenance concerns.   Influenza Vaccine      Comments:   Had flu shot in October 2013 at [facility]. By Hart Carwin, RN.

## 2012-05-17 NOTE — Telephone Encounter (Signed)
Pt wants to go ahead and schedule her colonoscopy. Does not need a flu shot was letting us know to update our records.

## 2012-05-17 NOTE — Telephone Encounter (Signed)
Fine for her to come in for flu shot.

## 2012-05-20 ENCOUNTER — Telehealth: Payer: Self-pay

## 2012-05-20 NOTE — Telephone Encounter (Signed)
Pt is saying that she would like ot have her labs done before her 6 month f/u at the [facility] across from where she works so she could have those to discuss with you at her appt. She asks if we could fax over lab orders to [phone]

## 2012-05-20 NOTE — Telephone Encounter (Signed)
Orders filled out and left up front.

## 2012-05-21 ENCOUNTER — Encounter: Payer: Self-pay | Admitting: Internal Medicine

## 2012-05-24 ENCOUNTER — Ambulatory Visit: Payer: PRIVATE HEALTH INSURANCE | Admitting: Internal Medicine

## 2012-05-25 ENCOUNTER — Telehealth: Payer: Self-pay | Admitting: Internal Medicine

## 2012-05-25 NOTE — Telephone Encounter (Signed)
Labs stable. Ca continues to be elevated 11.2

## 2012-05-31 ENCOUNTER — Encounter: Payer: Self-pay | Admitting: Internal Medicine

## 2012-05-31 ENCOUNTER — Ambulatory Visit (INDEPENDENT_AMBULATORY_CARE_PROVIDER_SITE_OTHER): Payer: PRIVATE HEALTH INSURANCE | Admitting: Internal Medicine

## 2012-05-31 VITALS — BP 118/68 | HR 86 | Temp 98.2°F | Ht 60.0 in | Wt 141.0 lb

## 2012-05-31 DIAGNOSIS — K219 Gastro-esophageal reflux disease without esophagitis: Secondary | ICD-10-CM | POA: Insufficient documentation

## 2012-05-31 DIAGNOSIS — I10 Essential (primary) hypertension: Secondary | ICD-10-CM

## 2012-05-31 DIAGNOSIS — I2510 Atherosclerotic heart disease of native coronary artery without angina pectoris: Secondary | ICD-10-CM

## 2012-05-31 DIAGNOSIS — E785 Hyperlipidemia, unspecified: Secondary | ICD-10-CM

## 2012-05-31 DIAGNOSIS — E210 Primary hyperparathyroidism: Secondary | ICD-10-CM | POA: Insufficient documentation

## 2012-05-31 DIAGNOSIS — J309 Allergic rhinitis, unspecified: Secondary | ICD-10-CM

## 2012-05-31 MED ORDER — ALPRAZOLAM ER 0.5 MG PO TB24: 0.5000 mg | ORAL_TABLET | Freq: Every evening | ORAL | Status: DC | PRN

## 2012-05-31 MED ORDER — ESOMEPRAZOLE MAGNESIUM 40 MG PO CPDR: 40.0000 mg | DELAYED_RELEASE_CAPSULE | Freq: Every day | ORAL | Status: DC

## 2012-05-31 NOTE — Assessment & Plan Note (Signed)
Symptoms most consistent with allergic rhinitis. Will try adding Dymista to Claritin. If no improvement, then consider ENT evaluation and empiric antibiotics for possible underlying sinus infection.

## 2012-05-31 NOTE — Assessment & Plan Note (Signed)
Asymptomatic. Continue current medical management.

## 2012-05-31 NOTE — Assessment & Plan Note (Signed)
Symptoms poorly controlled on pantoprazole. Will give trial of Nexium to see if any improvement. If no improvement, will send testing for H. Pylori and set up GI evaluation.

## 2012-05-31 NOTE — Assessment & Plan Note (Signed)
Elevated Ca 11.2, chronic, consistent with primary hyperparathyroidism. Discussed ENT evaluation for parathyroidectomy. She will discuss with her ENT.

## 2012-05-31 NOTE — Assessment & Plan Note (Signed)
BP Readings from Last 3 Encounters:  05/31/12 118/68  02/23/12 118/70  12/22/11 138/70   BP well controlled on current medications. Will continue. Reviewed recent renal function, which was stable. Follow up 6 months and prn.

## 2012-05-31 NOTE — Progress Notes (Signed)
Subjective:    Patient ID: [name], female    DOB: 03-13-1946, 67 y.o.   MRN: [id number]  HPI 67YO female with h/o CAD s/p CABG, HTN, HL, GERD presents for follow up.  CAD - Asymptomatic. Tries to follow healthy diet, get physical activity. Compliant with medications.  HTN - Compliant with meds. No headache, palpitations, chest pain.  GERD - poorly controlled with pantoprazole, occasional epigastric abdominal pain, which improves with second pantoprazole.  Also concerned today about 2 weeks of clear sinus drainage. Some anterior sinus pressure. No fever, chills, or purulent drainage. Symptoms started after buying new down pillows.  Was seen by employee health, given azithromycin with some improvement, claritin with no improvement. Questions whether she needs second course antibiotics.  Outpatient Encounter Prescriptions as of 05/31/2012  Medication Sig Dispense Refill  . ALPRAZolam (XANAX XR) 0.5 MG 24 hr tablet Take 1 tablet (0.5 mg total) by mouth at bedtime as needed.  90 tablet  1  . aspirin 81 MG EC tablet Take 81 mg by mouth daily.        . Cholecalciferol (VITAMIN D3) 1000 UNITS CAPS Take 1,000 Units by mouth 1 day or 1 dose.        . Coenzyme Q10 100 MG capsule Take 100 mg by mouth daily.       . CRESTOR 40 MG tablet TAKE ONE TABLET BY MOUTH DAILY  30 tablet  6  . gabapentin (NEURONTIN) 300 MG capsule TAKE ONE CAPSULE BY MOUTH EVERY DAY  30 capsule  6  . metoprolol tartrate (LOPRESSOR) 25 MG tablet Take 0.5 tablets (12.5 mg total) by mouth 2 (two) times daily.  90 tablet  4  . Omega-3 Fatty Acids (FISH OIL BURP-LESS) 1200 MG CAPS Take by mouth 1 dose over 46 hours.        . pantoprazole (PROTONIX) 40 MG tablet Take 1 tablet (40 mg total) by mouth daily.  90 tablet  3  . Probiotic Product (PROBIOTIC FORMULA) CAPS Take by mouth daily.      . [DISCONTINUED] ALPRAZolam (XANAX XR) 0.5 MG 24 hr tablet Take 1 tablet (0.5 mg total) by mouth at bedtime as needed.  90 tablet  1  .  [DISCONTINUED] HYDROcodone-acetaminophen (NORCO) 7.5-325 MG per tablet Take 1 tablet by mouth every 6 (six) hours as needed for pain.      Marland Kitchen esomeprazole (NEXIUM) 40 MG capsule Take 1 capsule (40 mg total) by mouth daily before breakfast.  30 capsule  6  . niacin (NIASPAN) 500 MG CR tablet Take 500 mg by mouth at bedtime.      . [DISCONTINUED] HYDROcodone-acetaminophen (LORTAB) 7.5-500 MG per tablet Take 1 tablet by mouth every 6 (six) hours as needed.         No facility-administered encounter medications on file as of 05/31/2012.   BP 118/68  Pulse 86  Temp(Src) 98.2 F (36.8 C) (Oral)  Ht 5' (1.524 m)  Wt 141 lb (63.957 kg)  BMI 27.54 kg/m2  SpO2 98%  Review of Systems  Constitutional: Negative for fever, chills, appetite change, fatigue and unexpected weight change.  HENT: Negative for ear pain, congestion, sore throat, trouble swallowing, neck pain, voice change and sinus pressure.   Eyes: Negative for visual disturbance.  Respiratory: Negative for cough, shortness of breath, wheezing and stridor.   Cardiovascular: Negative for chest pain, palpitations and leg swelling.  Gastrointestinal: Positive for abdominal pain (epigastric, after some foods). Negative for nausea, vomiting, diarrhea, constipation, blood in stool,  abdominal distention and anal bleeding.  Genitourinary: Negative for dysuria and flank pain.  Musculoskeletal: Negative for myalgias, arthralgias and gait problem.  Skin: Negative for color change and rash.  Neurological: Negative for dizziness and headaches.  Hematological: Negative for adenopathy. Does not bruise/bleed easily.  Psychiatric/Behavioral: Negative for suicidal ideas, sleep disturbance and dysphoric mood. The patient is not nervous/anxious.        Objective:   Physical Exam  Constitutional: She is oriented to person, place, and time. She appears well-developed and well-nourished. No distress.  HENT:  Head: Normocephalic and atraumatic.  Right Ear:  External ear normal.  Left Ear: External ear normal.  Nose: Nose normal.  Mouth/Throat: Oropharynx is clear and moist. No oropharyngeal exudate.  Eyes: Conjunctivae are normal. Pupils are equal, round, and reactive to light. Right eye exhibits no discharge. Left eye exhibits no discharge. No scleral icterus.  Neck: Normal range of motion. Neck supple. No tracheal deviation present. No thyromegaly present.  Cardiovascular: Normal rate, regular rhythm, normal heart sounds and intact distal pulses.  Exam reveals no gallop and no friction rub.   No murmur heard. Pulmonary/Chest: Effort normal and breath sounds normal. No respiratory distress. She has no wheezes. She has no rales. She exhibits no tenderness.  Abdominal: Soft. Bowel sounds are normal. She exhibits no distension. There is no tenderness.  Musculoskeletal: Normal range of motion. She exhibits no edema and no tenderness.  Lymphadenopathy:    She has no cervical adenopathy.  Neurological: She is alert and oriented to person, place, and time. No cranial nerve deficit. She exhibits normal muscle tone. Coordination normal.  Skin: Skin is warm and dry. No rash noted. She is not diaphoretic. No erythema. No pallor.  Psychiatric: She has a normal mood and affect. Her behavior is normal. Judgment and thought content normal.          Assessment & Plan:

## 2012-05-31 NOTE — Assessment & Plan Note (Signed)
Reviewed recent lipids, with LDL 66 and HDL 112. Encouraged continued efforts at healthy diet and regular physical activity. Continue Crestor. Follow up 6 months and prn.

## 2012-06-02 ENCOUNTER — Encounter: Payer: Self-pay | Admitting: Internal Medicine

## 2012-06-16 ENCOUNTER — Ambulatory Visit: Payer: Self-pay | Admitting: Otolaryngology

## 2012-06-16 IMAGING — CT NM PARTHYROID
1 series · 12 of 14 positions shown, 15 images · non-contrast
Comparison: none

REASON FOR EXAM: Elevated PTH Abn Parathyroid NO Surgery Depending on
Findings
COMMENTS:

PROCEDURE:     NM  - NM  PARATHYROID IMAGE 2 HR    [DATE]  [DATE]
RESULT:     Parathyroid scan
Comparison none
INDICATION: Elevated PTH
Radiotracer: 24.674 mCi of [QH] labeled sestamibi.
TECHNIQUE: 10 minute and 3-hour anterior imaging from the head to the
diaphragm, with or SPECT CT a performed for localization.

[Series 3: 3d parathroid 1.25 b31s · axial · 0.98mm/px · z∈[+1615,+1791]mm · 12 of 262 slices shown, 15 images]
[im 21/262  soft-tissue]
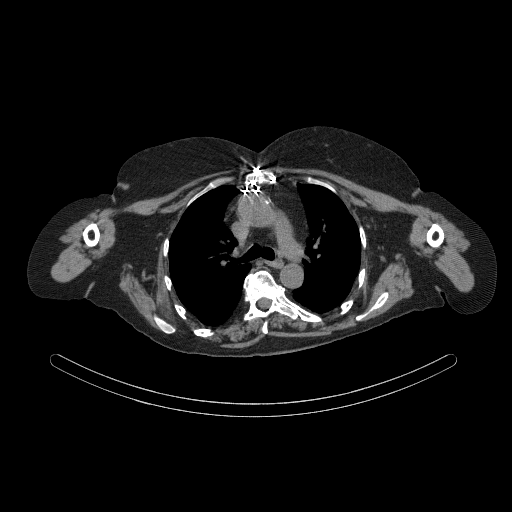
[im 21/262  bone]
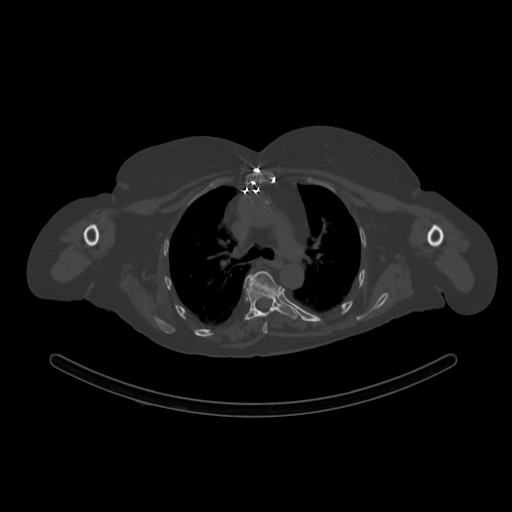
[im 41/262  bone]
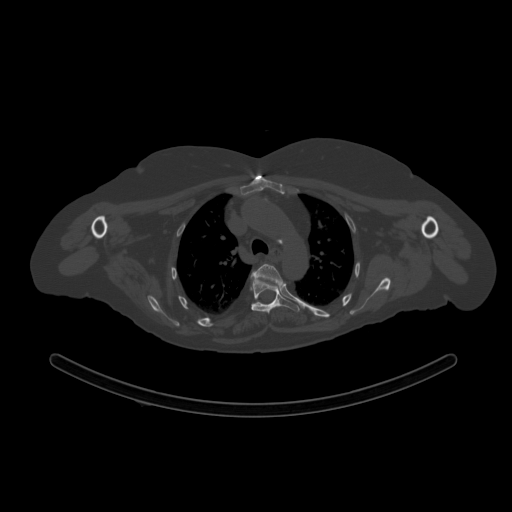
[im 61/262  bone]
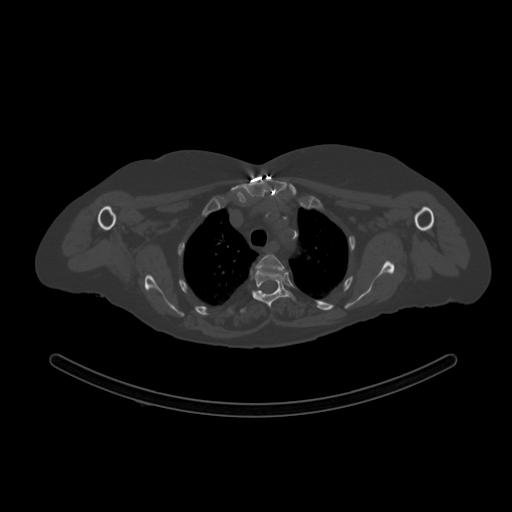
[im 81/262  bone]
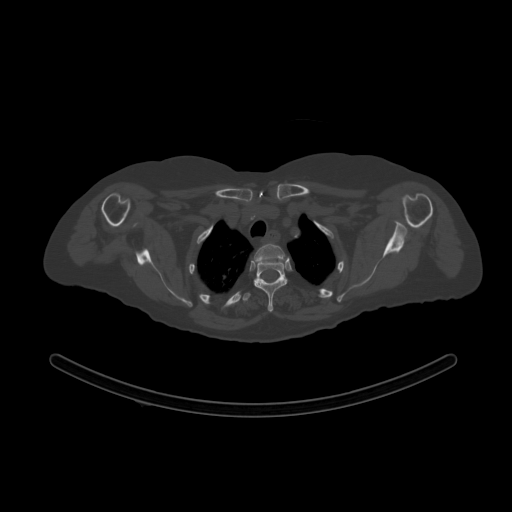
[im 101/262  soft-tissue]
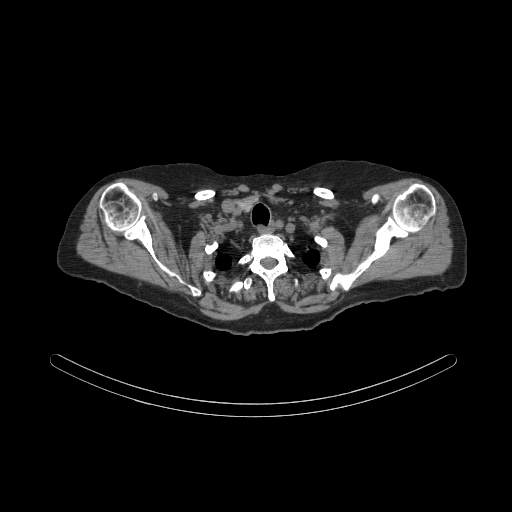
[im 101/262  bone]
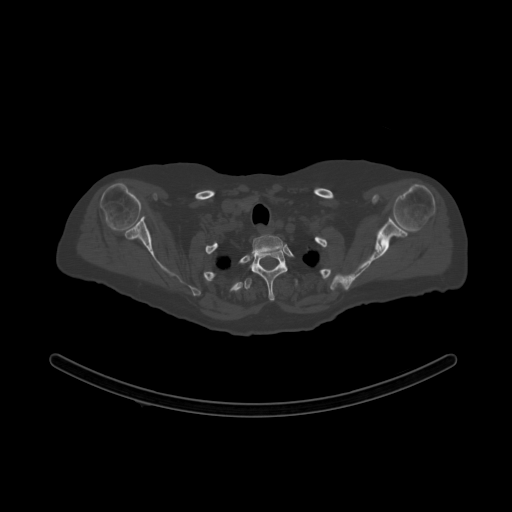
[im 121/262  bone]
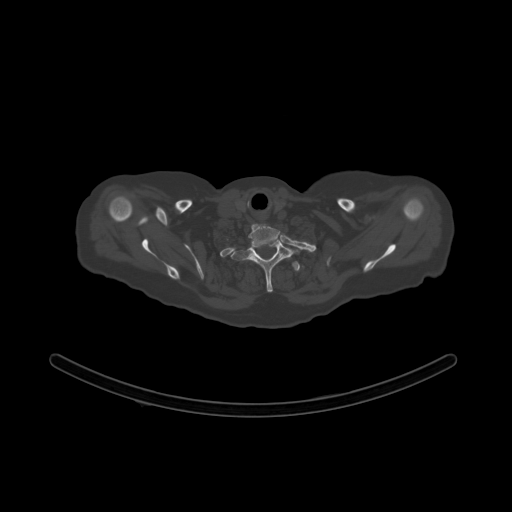
[im 141/262  bone]
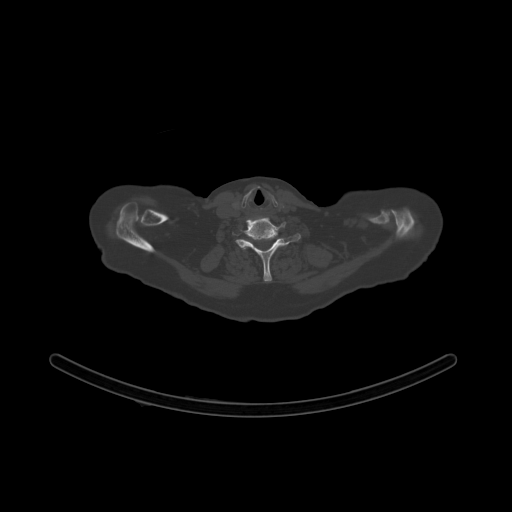
[im 161/262  bone]
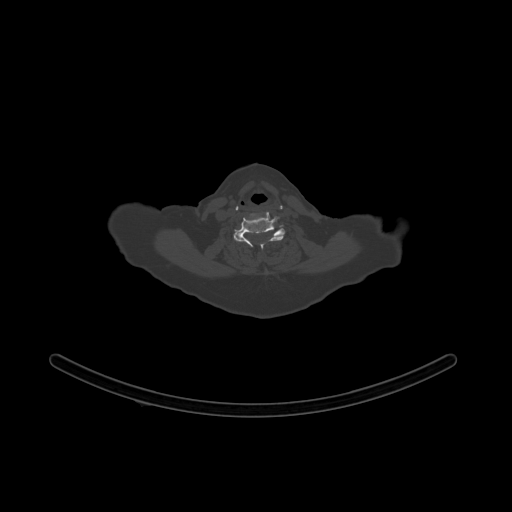
[im 181/262  soft-tissue]
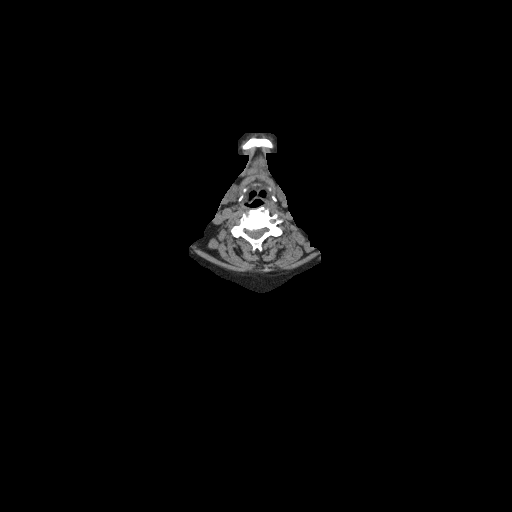
[im 181/262  bone]
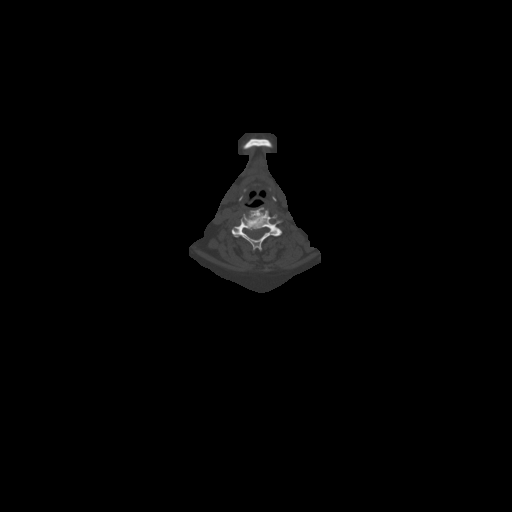
[im 201/262  bone]
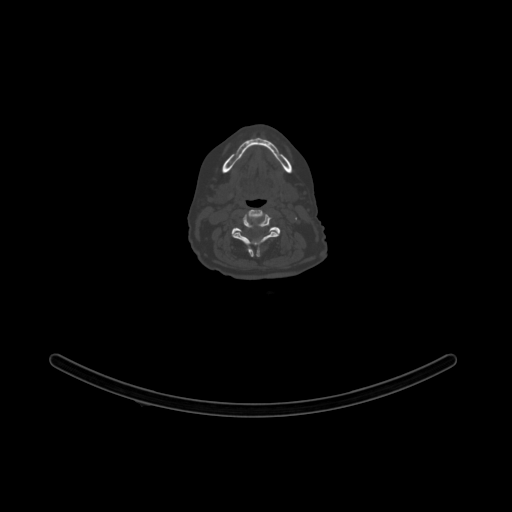
[im 221/262  bone]
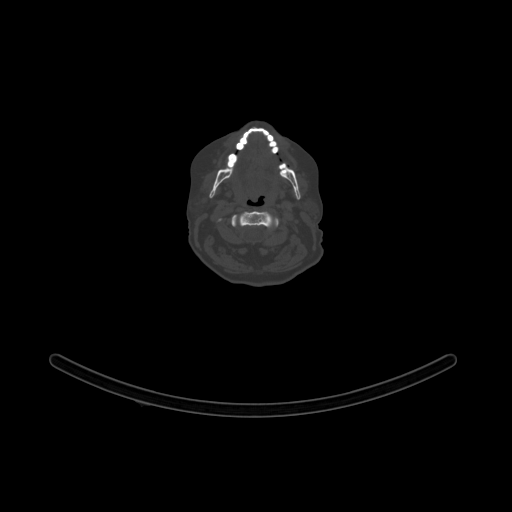
[im 241/262  bone]
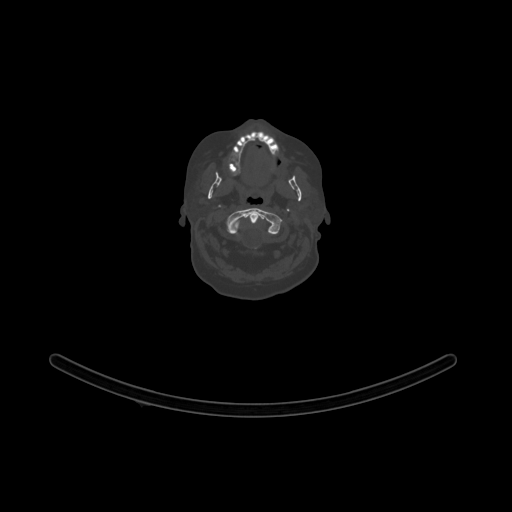

[12 of 14 positions shown; findings below may reference images not displayed]

FINDINGS: On the 10 minute image, normal thyroid activity is noted, which
demonstrates normal washout with no significant residual activity in the
thyroid bed on 3 hour imaging. Normal salivary gland uptake and cardiac
uptake is noted.

On the 3 hour delayed images there is focal radiotracer activity an
posterior to the right thyroid lobe in a nodule measuring 13 x 8 mm most
consistent with a parathyroid adenoma.
IMPRESSION: Right parathyroid adenoma measuring 13 x 8 mm.

[REDACTED]

## 2012-06-21 ENCOUNTER — Ambulatory Visit: Payer: PRIVATE HEALTH INSURANCE | Admitting: Internal Medicine

## 2012-06-22 ENCOUNTER — Encounter: Payer: Self-pay | Admitting: Internal Medicine

## 2012-06-22 ENCOUNTER — Telehealth: Payer: Self-pay

## 2012-06-22 NOTE — Telephone Encounter (Signed)
Pt needs cardiac clearance for R parathyroidectomy scheduled for 07/17/12 Will fax response to [facility]

## 2012-06-28 ENCOUNTER — Ambulatory Visit: Payer: Self-pay | Admitting: Otolaryngology

## 2012-06-28 ENCOUNTER — Telehealth: Payer: Self-pay

## 2012-06-28 LAB — CBC WITH DIFFERENTIAL/PLATELET
Basophil #: 0.1 10*3/uL (ref 0.0–0.1)
Basophil %: 1.5 %
Eosinophil %: 2.3 %
HGB: 14.1 g/dL (ref 12.0–16.0)
Lymphocyte #: 2.1 10*3/uL (ref 1.0–3.6)
Lymphocyte %: 34.7 %
MCV: 96 fL (ref 80–100)
Neutrophil #: 3 10*3/uL (ref 1.4–6.5)
Platelet: 199 10*3/uL (ref 150–440)
RDW: 14.2 % (ref 11.5–14.5)
WBC: 6 10*3/uL (ref 3.6–11.0)

## 2012-06-28 LAB — BASIC METABOLIC PANEL
Anion Gap: 5 — ABNORMAL LOW (ref 7–16)
Calcium, Total: 10.3 mg/dL — ABNORMAL HIGH (ref 8.5–10.1)
Chloride: 109 mmol/L — ABNORMAL HIGH (ref 98–107)
Creatinine: 0.77 mg/dL (ref 0.60–1.30)
EGFR (African American): >60
EGFR (Non-African Amer.): >60
Osmolality: 278 (ref 275–301)
Sodium: 139 mmol/L (ref 136–145)

## 2012-06-28 NOTE — Telephone Encounter (Signed)
Letter faxed.

## 2012-06-28 NOTE — Telephone Encounter (Signed)
Discussing with Dr. Mariah Milling

## 2012-06-28 NOTE — Telephone Encounter (Signed)
Heather from Dr Ophelia Charter office calling regarding cardiac clearance Fax [phone]

## 2012-06-28 NOTE — Telephone Encounter (Signed)
Acceptable risk Would prefer she stay on aspirin for the surgery

## 2012-07-01 ENCOUNTER — Telehealth: Payer: Self-pay

## 2012-07-01 NOTE — Telephone Encounter (Signed)
lmtcb

## 2012-07-01 NOTE — Telephone Encounter (Signed)
Per Dr. Mariah Milling pt may hold ASA for procedure but she must understand there is a risk involved with stopping this It is up to pt. I will call pt to inform her of this

## 2012-07-01 NOTE — Telephone Encounter (Signed)
Pt asks why she cannot come off asa prior to parathyroid surg i explained this is b/c of hx cad and cabg  She is concerned of increased risk of bleeding during/post surg i explained this is up to surgeon [id number]:YNWG of bleeding with this type of surg She asks that I call Heather with Dr. Ophelia Charter office to get details and risk and call her back

## 2012-07-01 NOTE — Telephone Encounter (Signed)
LMTCB on Heather's VM at Dr. Ophelia Charter office

## 2012-07-01 NOTE — Telephone Encounter (Signed)
I have to discuss with Dr. Mariah Milling to make sure still ok to hold ASA like she did for facelift

## 2012-07-01 NOTE — Telephone Encounter (Signed)
Pt has questions about why she does not need to stop aspirin. Please call

## 2012-07-01 NOTE — Telephone Encounter (Signed)
Pt informed She sill remain on ASA throughout procedure

## 2012-07-01 NOTE — Telephone Encounter (Signed)
Herbert Seta called me back Says this is a general request for all surgeries She says it would be an "optimal" situation if pt was given permission to hold ASA prior to surg Since pt has held in past for face lift, should be ok to do again I will call pt to advise

## 2012-07-07 ENCOUNTER — Ambulatory Visit: Payer: Self-pay | Admitting: Otolaryngology

## 2012-07-08 LAB — PATHOLOGY REPORT

## 2012-08-08 ENCOUNTER — Encounter: Payer: Self-pay | Admitting: Internal Medicine

## 2012-09-08 ENCOUNTER — Encounter: Payer: Self-pay | Admitting: Cardiovascular Disease

## 2012-09-08 ENCOUNTER — Other Ambulatory Visit: Payer: Self-pay

## 2012-09-08 DIAGNOSIS — E785 Hyperlipidemia, unspecified: Secondary | ICD-10-CM

## 2012-09-10 ENCOUNTER — Other Ambulatory Visit: Payer: Self-pay | Admitting: Internal Medicine

## 2012-09-13 NOTE — Telephone Encounter (Signed)
Rx sent to pharmacy by escript  

## 2012-09-20 ENCOUNTER — Ambulatory Visit (INDEPENDENT_AMBULATORY_CARE_PROVIDER_SITE_OTHER): Payer: PRIVATE HEALTH INSURANCE | Admitting: Cardiovascular Disease

## 2012-09-20 ENCOUNTER — Encounter: Payer: Self-pay | Admitting: Cardiovascular Disease

## 2012-09-20 VITALS — BP 120/72 | HR 78 | Ht 60.0 in | Wt 144.5 lb

## 2012-09-20 DIAGNOSIS — I2510 Atherosclerotic heart disease of native coronary artery without angina pectoris: Secondary | ICD-10-CM

## 2012-09-20 DIAGNOSIS — E785 Hyperlipidemia, unspecified: Secondary | ICD-10-CM

## 2012-09-20 DIAGNOSIS — I25810 Atherosclerosis of coronary artery bypass graft(s) without angina pectoris: Secondary | ICD-10-CM

## 2012-09-20 DIAGNOSIS — R635 Abnormal weight gain: Secondary | ICD-10-CM

## 2012-09-20 DIAGNOSIS — I10 Essential (primary) hypertension: Secondary | ICD-10-CM

## 2012-09-20 NOTE — Patient Instructions (Addendum)
You are doing well. No medication changes were made.  Please call us if you have new issues that need to be addressed before your next appt.  Your physician wants you to follow-up in: 6 months.  You will receive a reminder letter in the mail two months in advance. If you don't receive a letter, please call our office to schedule the follow-up appointment.   

## 2012-09-20 NOTE — Assessment & Plan Note (Signed)
Currently with no symptoms of angina. No further workup at this time. Continue current medication regimen. 

## 2012-09-20 NOTE — Progress Notes (Signed)
Patient ID: [name], female    DOB: [birth year], 67 y.o.   MRN: [id number]  HPI Comments: Ms. Drawdy is a pleasant 67 year old woman with a history of coronary artery disease, stent to her left circumflex and 2 stents to the LAD in March of 2011, aorto femoral bypass grafting in early October 2011,  with occlusion of her coronary stents in mid-October requiring bypass surgery at [facility] in mid to late October 2012. She presents for routine followup.    long history of smoking though stopped in March of 2011  plastic surgery on her face which went well Admission to the hospital October 14 for heartburn type symptoms. She had abnormal looking stress test and had cardiac catheterization that showed patent grafts. Work up performed by Dr. Kirke Corin. She was discharged on proton pump inhibitor and has had no further symptoms.  Details of the cardiac catheterization are that she has a LIMA to the LAD, vein graft to the left circumflex, vein graft to the PDA which is occluded that supplies a very small area. Left main has 50% disease, ostial LAD has 95% in-stent restenosis, mid LAD has 100% disease, 75% ostial circumflex, 100% mid circumflex, very small OM1, 30% RCA disease, ejection fraction 45%  Stress test shows suboptimal images due to GI activity on resting images, significant anterior wall ischemia in the mid to distal segments, reduced ejection fraction of 47%     Prior ventricular arrhythmia when her stents were placed in the Cath Lab requiring shock.    echocardiogram improved, EF  greater than 40%.   Cardiac catheter report from July 15 2009  indicate she had a vision bare metal stent 2.75 x 28 mm in the LCX, mini vision bare metal stent 2.5 x 28 and 2.5 x 15 mm stent to the LAD.  Today she reports she is concerned about continued abdominal weight gain. She is exercising 3 times per week, trying to watch what she eats. Despite her efforts, weight is not improving. Total cholesterol 176, LDL 56,  HDL 105. Improved from February 2014.  EKG shows normal sinus rhythm with rate 78 beats per minute, nonspecific ST abnormality in leads V5, V6, 1 and aVL   Outpatient Encounter Prescriptions as of 09/20/2012  Medication Sig Dispense Refill  . ALPRAZolam (XANAX XR) 0.5 MG 24 hr tablet Take 1 tablet (0.5 mg total) by mouth at bedtime as needed.  90 tablet  1  . aspirin 81 MG EC tablet Take 81 mg by mouth daily.        . Cholecalciferol (VITAMIN D3) 1000 UNITS CAPS Take 1,000 Units by mouth 1 day or 1 dose.        . Coenzyme Q10 100 MG capsule Take 100 mg by mouth daily.       . CRESTOR 40 MG tablet TAKE ONE TABLET BY MOUTH DAILY  30 tablet  6  . metoprolol tartrate (LOPRESSOR) 25 MG tablet Take 0.5 tablets (12.5 mg total) by mouth 2 (two) times daily.  90 tablet  4  . Omega-3 Fatty Acids (FISH OIL BURP-LESS) 1200 MG CAPS Take by mouth 1 dose over 46 hours.        . pantoprazole (PROTONIX) 40 MG tablet TAKE ONE TABLET BY MOUTH EVERY DAY  90 tablet  1  . Probiotic Product (PROBIOTIC FORMULA) CAPS Take by mouth daily.        Review of Systems  Constitutional: Negative.   HENT: Negative.   Eyes: Negative.   Respiratory:  Negative.   Cardiovascular: Negative.   Gastrointestinal: Negative.   Musculoskeletal: Positive for back pain.       Leg pain  Skin: Negative.   Neurological: Negative.   Psychiatric/Behavioral: Negative.   All other systems reviewed and are negative.    BP 120/72  Pulse 78  Ht 5' (1.524 m)  Wt 144 lb 8 oz (65.545 kg)  BMI 28.22 kg/m2  Physical Exam  Nursing note and vitals reviewed. Constitutional: She is oriented to person, place, and time. She appears well-developed and well-nourished.  HENT:  Head: Normocephalic.  Nose: Nose normal.  Mouth/Throat: Oropharynx is clear and moist.  Eyes: Conjunctivae are normal. Pupils are equal, round, and reactive to light.  Neck: Normal range of motion. Neck supple. No JVD present.  Cardiovascular: Normal rate, regular  rhythm, S1 normal, S2 normal, normal heart sounds and intact distal pulses.  Exam reveals no gallop and no friction rub.   No murmur heard. Pulmonary/Chest: Effort normal and breath sounds normal. No respiratory distress. She has no wheezes. She has no rales. She exhibits no tenderness.  Abdominal: Soft. Bowel sounds are normal. She exhibits no distension. There is no tenderness.  Musculoskeletal: Normal range of motion. She exhibits no edema and no tenderness.  Lymphadenopathy:    She has no cervical adenopathy.  Neurological: She is alert and oriented to person, place, and time. Coordination normal.  Skin: Skin is warm and dry. No rash noted. No erythema.  Psychiatric: She has a normal mood and affect. Her behavior is normal. Judgment and thought content normal.    Assessment and Plan

## 2012-09-20 NOTE — Assessment & Plan Note (Signed)
Cholesterol is at goal on the current lipid regimen. No changes to the medications were made.  

## 2012-09-20 NOTE — Assessment & Plan Note (Signed)
She is concerned about her weight. She seems to carry most of her weight in her central abdominal area. She is exercising 3 days per week. I did suggest she could do some exercise on the other days as well. She is already watching her diet but does report going out with a girlfriend for meals. Had "pancakes". She's interested in medication options for weight loss. I am unfamiliar with any that she might benefit from. I suggested she talk with Dr. walker to see if she is aware of any options.

## 2012-09-20 NOTE — Assessment & Plan Note (Signed)
Blood pressure is well controlled on today's visit. No changes made to the medications. 

## 2012-11-01 ENCOUNTER — Encounter: Payer: Self-pay | Admitting: Internal Medicine

## 2012-11-04 ENCOUNTER — Ambulatory Visit: Payer: Self-pay | Admitting: Unknown Physician Specialty

## 2012-11-04 LAB — HM COLONOSCOPY

## 2012-11-09 ENCOUNTER — Telehealth: Payer: Self-pay | Admitting: Internal Medicine

## 2012-11-09 NOTE — Telephone Encounter (Signed)
Labs were normal from 7/22. We can discuss at upcoming visit on August 7th. I will give you a copy to set aside

## 2012-11-10 ENCOUNTER — Other Ambulatory Visit: Payer: Self-pay | Admitting: Cardiovascular Disease

## 2012-11-10 ENCOUNTER — Encounter: Payer: Self-pay | Admitting: Internal Medicine

## 2012-11-10 NOTE — Telephone Encounter (Signed)
LMTCB

## 2012-11-11 ENCOUNTER — Other Ambulatory Visit: Payer: Self-pay | Admitting: *Deleted

## 2012-11-11 MED ORDER — ALPRAZOLAM ER 0.5 MG PO TB24: 0.5000 mg | ORAL_TABLET | Freq: Every evening | ORAL | Status: DC | PRN

## 2012-11-11 MED ORDER — ROSUVASTATIN CALCIUM 40 MG PO TABS: ORAL_TABLET | ORAL | Status: DC

## 2012-11-11 NOTE — Telephone Encounter (Signed)
Patient informed and verbally agreed.  

## 2012-11-11 NOTE — Telephone Encounter (Signed)
Refilled Crestor sent [employer].

## 2012-11-24 ENCOUNTER — Ambulatory Visit (INDEPENDENT_AMBULATORY_CARE_PROVIDER_SITE_OTHER): Payer: PRIVATE HEALTH INSURANCE | Admitting: Internal Medicine

## 2012-11-24 ENCOUNTER — Encounter: Payer: Self-pay | Admitting: Internal Medicine

## 2012-11-24 VITALS — BP 110/60 | HR 68 | Temp 97.7°F | Ht 60.5 in | Wt 142.0 lb

## 2012-11-24 DIAGNOSIS — Z0000 Encounter for general adult medical examination without abnormal findings: Secondary | ICD-10-CM

## 2012-11-24 DIAGNOSIS — Z1239 Encounter for other screening for malignant neoplasm of breast: Secondary | ICD-10-CM

## 2012-11-24 NOTE — Progress Notes (Signed)
Subjective:    Patient ID: [name], female    DOB: 11/09/1945, 67 y.o.   MRN: [id number]  HPI 67 year old female with history of coronary artery disease status post CABG, hyperlipidemia, hypertension, anxiety presents for annual exam. She reports she is generally feeling well. She has been following a healthy diet and exercising 3 times per week at a local gym. She denies any recent chest pain, headache, palpitations. Review of recent lab shows excellent control of cholesterol with total cholesterol of 134. She is compliant with her medications including Crestor. She is due for mammogram. Last Pap was 2013 was normal, HPV negative.  Outpatient Encounter Prescriptions as of 11/24/2012  Medication Sig Dispense Refill  . ALPRAZolam (XANAX XR) 0.5 MG 24 hr tablet Take 1 tablet (0.5 mg total) by mouth at bedtime as needed.  90 tablet  1  . aspirin 81 MG EC tablet Take 81 mg by mouth daily.        . Cholecalciferol (VITAMIN D3) 1000 UNITS CAPS Take 1,000 Units by mouth 1 day or 1 dose.        . Coenzyme Q10 100 MG capsule Take 100 mg by mouth daily.       . metoprolol tartrate (LOPRESSOR) 25 MG tablet Take 0.5 tablets (12.5 mg total) by mouth 2 (two) times daily.  90 tablet  4  . Omega-3 Fatty Acids (FISH OIL BURP-LESS) 1200 MG CAPS Take by mouth 1 dose over 46 hours.        . pantoprazole (PROTONIX) 40 MG tablet TAKE ONE TABLET BY MOUTH EVERY DAY  90 tablet  1  . Probiotic Product (PROBIOTIC FORMULA) CAPS Take by mouth daily.      . rosuvastatin (CRESTOR) 40 MG tablet TAKE ONE TABLET BY MOUTH DAILY  30 tablet  6   No facility-administered encounter medications on file as of 11/24/2012.   BP 110/60  Pulse 68  Temp(Src) 97.7 F (36.5 C) (Oral)  Ht 5' 0.5" (1.537 m)  Wt 142 lb (64.411 kg)  BMI 27.27 kg/m2  SpO2 93%  Review of Systems  Constitutional: Negative for fever, chills, appetite change, fatigue and unexpected weight change.  HENT: Negative for ear pain, congestion, sore throat,  trouble swallowing, neck pain, voice change and sinus pressure.   Eyes: Negative for visual disturbance.  Respiratory: Negative for cough, shortness of breath, wheezing and stridor.   Cardiovascular: Negative for chest pain, palpitations and leg swelling.  Gastrointestinal: Negative for nausea, vomiting, abdominal pain, diarrhea, constipation, blood in stool, abdominal distention and anal bleeding.  Genitourinary: Negative for dysuria and flank pain.  Musculoskeletal: Negative for myalgias, arthralgias and gait problem.  Skin: Negative for color change and rash.  Neurological: Negative for dizziness and headaches.  Hematological: Negative for adenopathy. Does not bruise/bleed easily.  Psychiatric/Behavioral: Negative for suicidal ideas, sleep disturbance and dysphoric mood. The patient is not nervous/anxious.        Objective:   Physical Exam  Constitutional: She is oriented to person, place, and time. She appears well-developed and well-nourished. No distress.  HENT:  Head: Normocephalic and atraumatic.  Right Ear: External ear normal.  Left Ear: External ear normal.  Nose: Nose normal.  Mouth/Throat: Oropharynx is clear and moist. No oropharyngeal exudate.  Eyes: Conjunctivae are normal. Pupils are equal, round, and reactive to light. Right eye exhibits no discharge. Left eye exhibits no discharge. No scleral icterus.  Neck: Normal range of motion. Neck supple. No tracheal deviation present. No thyromegaly present.  Cardiovascular: Normal  rate, regular rhythm, normal heart sounds and intact distal pulses.  Exam reveals no gallop and no friction rub.   No murmur heard. Pulmonary/Chest: Effort normal and breath sounds normal. No accessory muscle usage. Not tachypneic. No respiratory distress. She has no decreased breath sounds. She has no wheezes. She has no rales. She exhibits no tenderness. Right breast exhibits no inverted nipple, no mass, no nipple discharge, no skin change and no  tenderness. Left breast exhibits no inverted nipple, no mass, no nipple discharge, no skin change and no tenderness. Breasts are symmetrical.    Abdominal: Soft. Bowel sounds are normal. She exhibits no distension and no mass. There is no tenderness. There is no rebound and no guarding.  Musculoskeletal: Normal range of motion. She exhibits no edema and no tenderness.  Lymphadenopathy:    She has no cervical adenopathy.  Neurological: She is alert and oriented to person, place, and time. No cranial nerve deficit. She exhibits normal muscle tone. Coordination normal.  Skin: Skin is warm and dry. No rash noted. She is not diaphoretic. No erythema. No pallor.  Psychiatric: She has a normal mood and affect. Her behavior is normal. Judgment and thought content normal.          Assessment & Plan:

## 2012-11-24 NOTE — Assessment & Plan Note (Signed)
General medical exam including breast exam normal today. Pap and pelvic deferred given normal Pap 2013, HPV negative. Reviewed recent lab work including lipids, CMP, CBC, thyroid function. Lipids are at: With LDL less than 70. Calcium level is improved and parathyroid hormone level is improved at 38. Continue current medications. Encouraged continued efforts at healthy diet and regular physical activity. Followup in one year for physical exam. She will followup in 6 months with her cardiologist.

## 2012-11-29 ENCOUNTER — Telehealth: Payer: Self-pay | Admitting: Internal Medicine

## 2012-11-29 NOTE — Telephone Encounter (Signed)
Can we please request notes from them

## 2012-11-29 NOTE — Telephone Encounter (Signed)
Recent urinalysis was positive for hydrocodone. Has your pain management physician been prescribing pain medication? Can you please clarify what this prescription is? We will also need to contact him for records.

## 2012-11-29 NOTE — Telephone Encounter (Signed)
Patient stated she take it for her back. She see Lunette Stands at [phone], Dewaine Conger.

## 2012-11-29 NOTE — Telephone Encounter (Signed)
Left message to call back  

## 2012-12-01 NOTE — Telephone Encounter (Signed)
Left message for Victorino Dike in medical records to return my call.

## 2012-12-01 NOTE — Telephone Encounter (Signed)
Need a signed medical release before they release anything, please fax it to [phone]

## 2012-12-02 ENCOUNTER — Encounter: Payer: Self-pay | Admitting: Internal Medicine

## 2012-12-02 NOTE — Telephone Encounter (Signed)
Left message to call back  

## 2012-12-05 NOTE — Telephone Encounter (Signed)
Patient responded via Mychart.

## 2012-12-07 ENCOUNTER — Encounter: Payer: Self-pay | Admitting: Internal Medicine

## 2013-01-17 ENCOUNTER — Encounter: Payer: Self-pay | Admitting: Internal Medicine

## 2013-02-19 ENCOUNTER — Encounter: Payer: Self-pay | Admitting: Internal Medicine

## 2013-02-21 ENCOUNTER — Telehealth: Payer: Self-pay | Admitting: Internal Medicine

## 2013-02-21 NOTE — Telephone Encounter (Signed)
Does she need a pneumonia shot?

## 2013-02-21 NOTE — Telephone Encounter (Signed)
See below my chart message      Appointment Request From: Georga Hacking Sabala      With Provider: Wynona Dove, MD [facility] [place]]      Preferred Date Range: From 02/19/2013 To 03/14/2013      Preferred Times: Tuesday Morning      Reason: To address the following health maintenance concerns.   Pneumococcal Polysaccharide Vaccine Age 65 And Over      Comments:   Am I due for the pneumonia shot? If so please schedule an appt for this. Thanks.

## 2013-02-21 NOTE — Telephone Encounter (Signed)
Pt is not due for pneumovax based on our records.

## 2013-02-21 NOTE — Telephone Encounter (Signed)
Patient was sent a Mychart message 

## 2013-02-23 ENCOUNTER — Encounter: Payer: Self-pay | Admitting: Internal Medicine

## 2013-02-23 ENCOUNTER — Telehealth: Payer: Self-pay | Admitting: Internal Medicine

## 2013-02-23 MED ORDER — PANTOPRAZOLE SODIUM 40 MG PO TBEC: DELAYED_RELEASE_TABLET | ORAL | Status: DC

## 2013-02-23 NOTE — Telephone Encounter (Signed)
[facility] calling.  Pt has switched pharmacy.  New rx needed for pantoprozole 40 mg.  Fx:  [phone]

## 2013-02-23 NOTE — Telephone Encounter (Signed)
Prescription sent to pharmacy.

## 2013-03-05 ENCOUNTER — Encounter: Payer: Self-pay | Admitting: Internal Medicine

## 2013-03-06 ENCOUNTER — Telehealth: Payer: Self-pay | Admitting: Internal Medicine

## 2013-03-06 NOTE — Telephone Encounter (Signed)
Patient returned call and appointment scheduled for tomorrow at 715

## 2013-03-06 NOTE — Telephone Encounter (Signed)
Pt has had a rash for 2-3 weeks.  States it will go away and then flare back up.  States last night if flared up on both thighs and got really red and inflamed.  Pt asking to be seen.  No appt available today or tomorrow.  Pt asking if Dr. Dan Humphreys will fit her in today or tomorrow.  Pt did not want 6:30 p.m.

## 2013-03-06 NOTE — Telephone Encounter (Signed)
Left message to call back  

## 2013-03-07 ENCOUNTER — Encounter: Payer: Self-pay | Admitting: Internal Medicine

## 2013-03-07 ENCOUNTER — Ambulatory Visit (INDEPENDENT_AMBULATORY_CARE_PROVIDER_SITE_OTHER): Payer: Medicare Other | Admitting: Internal Medicine

## 2013-03-07 VITALS — BP 122/78 | HR 69 | Temp 98.3°F | Ht 60.5 in | Wt 143.8 lb

## 2013-03-07 DIAGNOSIS — G8929 Other chronic pain: Secondary | ICD-10-CM

## 2013-03-07 DIAGNOSIS — M810 Age-related osteoporosis without current pathological fracture: Secondary | ICD-10-CM

## 2013-03-07 DIAGNOSIS — M549 Dorsalgia, unspecified: Secondary | ICD-10-CM

## 2013-03-07 DIAGNOSIS — L259 Unspecified contact dermatitis, unspecified cause: Secondary | ICD-10-CM

## 2013-03-07 MED ORDER — TRIAMCINOLONE 0.1 % CREAM:EUCERIN CREAM 1:1: 1.0000 "application " | TOPICAL_CREAM | Freq: Two times a day (BID) | CUTANEOUS | Status: DC | PRN

## 2013-03-07 NOTE — Assessment & Plan Note (Signed)
Rash seems most consistent with contact dermatitis. Will start topical use or and/triamcinolone cream to see if any improvement. If no improvement, would favor referral to dermatology.

## 2013-03-07 NOTE — Progress Notes (Signed)
Pre-visit discussion using our clinic review tool. No additional management support is needed unless otherwise documented below in the visit note.  

## 2013-03-07 NOTE — Assessment & Plan Note (Signed)
Patient has chronic low back pain secondary to degenerative disease and is followed by pain management. She now has developed mid back pain described as aching. This pain seems most consistent with osteoarthritis. As noted, will get repeat bone density testing. Continue hydrocodone as needed for severe pain. Note this as prescribed by the pain clinic. If pain is persisting, consider plain x-ray of the thoracic spine to evaluate for vertebral fracture.

## 2013-03-07 NOTE — Progress Notes (Signed)
Subjective:    Patient ID: [name], female    DOB: October 06, 1945, 67 y.o.   MRN: [id number]  HPI 67 year old female with history of coronary artery disease, hypertension, chronic low back pain presents for acute visit complaining of rash. She first noticed rash on her inner thighs about one week ago. She denies use of any new lotions, detergents, soaps, or perfumes. The rash is described as red and itchy. She has not been applying any topical treatments to the rash. She denies any fever or chills.  She is also concerned about several month history of aching mid back pain. She questions if she may have osteoporosis with vertebral fracture. It has been several years since she had a bone density testing. She has not been taking calcium supplementation. She does take vitamin D supplementation. She does exercise regularly at a and exercise does not seem to exacerbate pain. Pain seems to be exacerbated by sitting at her desk for prolonged period of time or lifting heavy objects. She has been using hydrocodone prescribed by pain management with some improvement.  Outpatient Encounter Prescriptions as of 03/07/2013  Medication Sig  . ALPRAZolam (XANAX XR) 0.5 MG 24 hr tablet Take 1 tablet (0.5 mg total) by mouth at bedtime as needed.  Marland Kitchen aspirin 81 MG EC tablet Take 81 mg by mouth daily.    . Cholecalciferol (VITAMIN D3) 1000 UNITS CAPS Take 1,000 Units by mouth 1 day or 1 dose.    . Coenzyme Q10 100 MG capsule Take 100 mg by mouth daily.   Marland Kitchen HYDROcodone-acetaminophen (NORCO) 7.5-325 MG per tablet Take 1 tablet by mouth every 6 (six) hours as needed for moderate pain.  . metoprolol tartrate (LOPRESSOR) 25 MG tablet Take 0.5 tablets (12.5 mg total) by mouth 2 (two) times daily.  . Omega-3 Fatty Acids (FISH OIL BURP-LESS) 1200 MG CAPS Take by mouth 1 dose over 46 hours.    . pantoprazole (PROTONIX) 40 MG tablet TAKE ONE TABLET BY MOUTH EVERY DAY  . Probiotic Product (PROBIOTIC FORMULA) CAPS Take by mouth  daily.  . rosuvastatin (CRESTOR) 40 MG tablet TAKE ONE TABLET BY MOUTH DAILY   BP 122/78  Pulse 69  Temp(Src) 98.3 F (36.8 C) (Oral)  Ht 5' 0.5" (1.537 m)  Wt 143 lb 12 oz (65.205 kg)  BMI 27.60 kg/m2  SpO2 94%   Review of Systems  Constitutional: Negative for fever, chills, appetite change, fatigue and unexpected weight change.  HENT: Negative for congestion, ear pain, sinus pressure, sore throat, trouble swallowing and voice change.   Eyes: Negative for visual disturbance.  Respiratory: Negative for cough, shortness of breath, wheezing and stridor.   Cardiovascular: Negative for chest pain, palpitations and leg swelling.  Gastrointestinal: Negative for nausea, vomiting, abdominal pain, diarrhea, constipation, blood in stool, abdominal distention and anal bleeding.  Genitourinary: Negative for dysuria and flank pain.  Musculoskeletal: Positive for arthralgias, back pain and myalgias. Negative for gait problem and neck pain.  Skin: Positive for color change and rash.  Neurological: Negative for dizziness and headaches.  Hematological: Negative for adenopathy. Does not bruise/bleed easily.  Psychiatric/Behavioral: Negative for suicidal ideas, sleep disturbance and dysphoric mood. The patient is not nervous/anxious.        Objective:   Physical Exam  Constitutional: She is oriented to person, place, and time. She appears well-developed and well-nourished. No distress.  HENT:  Head: Normocephalic and atraumatic.  Right Ear: External ear normal.  Left Ear: External ear normal.  Nose: Nose normal.  Mouth/Throat: Oropharynx is clear and moist. No oropharyngeal exudate.  Eyes: Conjunctivae are normal. Pupils are equal, round, and reactive to light. Right eye exhibits no discharge. Left eye exhibits no discharge. No scleral icterus.  Neck: Normal range of motion. Neck supple. No tracheal deviation present. No thyromegaly present.  Cardiovascular: Normal rate, regular rhythm, normal  heart sounds and intact distal pulses.  Exam reveals no gallop and no friction rub.   No murmur heard. Pulmonary/Chest: Effort normal and breath sounds normal. No accessory muscle usage. Not tachypneic. No respiratory distress. She has no decreased breath sounds. She has no wheezes. She has no rhonchi. She has no rales. She exhibits no tenderness.  Musculoskeletal: Normal range of motion. She exhibits no edema and no tenderness.  Lymphadenopathy:    She has no cervical adenopathy.  Neurological: She is alert and oriented to person, place, and time. No cranial nerve deficit. She exhibits normal muscle tone. Coordination normal.  Skin: Skin is warm and dry. Rash noted. Rash is maculopapular (bialteral inner thighs down to knees). She is not diaphoretic. There is erythema. No pallor.  Psychiatric: She has a normal mood and affect. Her behavior is normal. Judgment and thought content normal.          Assessment & Plan:

## 2013-03-07 NOTE — Assessment & Plan Note (Signed)
Patient reports history of osteoporosis. Will set up repeat bone density testing. Continue vitamin D supplementation. Continue adequate intake of dietary calcium.

## 2013-03-08 ENCOUNTER — Encounter: Payer: Self-pay | Admitting: Internal Medicine

## 2013-03-12 ENCOUNTER — Encounter: Payer: Self-pay | Admitting: Internal Medicine

## 2013-03-14 ENCOUNTER — Ambulatory Visit (INDEPENDENT_AMBULATORY_CARE_PROVIDER_SITE_OTHER): Payer: Medicare Other | Admitting: Cardiovascular Disease

## 2013-03-14 ENCOUNTER — Encounter: Payer: Self-pay | Admitting: Cardiovascular Disease

## 2013-03-14 VITALS — BP 120/70 | HR 70 | Ht 60.0 in | Wt 145.0 lb

## 2013-03-14 DIAGNOSIS — I25810 Atherosclerosis of coronary artery bypass graft(s) without angina pectoris: Secondary | ICD-10-CM

## 2013-03-14 DIAGNOSIS — E785 Hyperlipidemia, unspecified: Secondary | ICD-10-CM

## 2013-03-14 DIAGNOSIS — I2510 Atherosclerotic heart disease of native coronary artery without angina pectoris: Secondary | ICD-10-CM

## 2013-03-14 DIAGNOSIS — I10 Essential (primary) hypertension: Secondary | ICD-10-CM

## 2013-03-14 NOTE — Patient Instructions (Signed)
You are doing well. No medication changes were made.  Please call us if you have new issues that need to be addressed before your next appt.  Your physician wants you to follow-up in: 6 months.  You will receive a reminder letter in the mail two months in advance. If you don't receive a letter, please call our office to schedule the follow-up appointment.   

## 2013-03-14 NOTE — Assessment & Plan Note (Signed)
Currently with no symptoms of angina. No further workup at this time. Continue current medication regimen. 

## 2013-03-14 NOTE — Assessment & Plan Note (Signed)
Cholesterol is at goal on the current lipid regimen. No changes to the medications were made.  

## 2013-03-14 NOTE — Progress Notes (Signed)
Patient ID: [name], female    DOB: November 27, 1945, 67 y.o.   MRN: [id number]  HPI Comments: Ms. Flener is a pleasant 67 year old woman with a history of coronary artery disease, stent to her left circumflex and 2 stents to the LAD in March of 2011, aorto femoral bypass grafting in early October 2011,  with occlusion of her coronary stents in mid-October requiring bypass surgery at [facility] in mid to late October 2012. She presents for routine followup.  History of thyroid surgery March 2014   long history of smoking though stopped in March of 2011  plastic surgery on her face in the past Admission to the hospital February 01 2012 for heartburn type symptoms. She had abnormal looking stress test and had cardiac catheterization that showed patent grafts. Work up performed by Dr. Kirke Corin. She was discharged on proton pump inhibitor and has had no further symptoms.  Details of the cardiac catheterization are that she has a LIMA to the LAD, vein graft to the left circumflex, vein graft to the PDA which is occluded that supplies a very small area. Left main has 50% disease, ostial LAD has 95% in-stent restenosis, mid LAD has 100% disease, 75% ostial circumflex, 100% mid circumflex, very small OM1, 30% RCA disease, ejection fraction 45%  Stress test shows suboptimal images due to GI activity on resting images, significant anterior wall ischemia in the mid to distal segments, reduced ejection fraction of 47%     Prior ventricular arrhythmia when her stents were placed in the Cath Lab requiring shock.    echocardiogram improved, EF  greater than 40%.   Cardiac catheter report from July 15 2009  indicate she had a vision bare metal stent 2.75 x 28 mm in the LCX, mini vision bare metal stent 2.5 x 28 and 2.5 x 15 mm stent to the LAD.   She is exercising 3 times per week, trying to watch what she eats. Weight continues to be a problem. She is at the gym 3 or 4 times per week, does biking 4-5 miles at a  time Otherwise DJD in her neck and back Recent lab work showing total cholesterol 134, HDL 69, LDL 52, creatinine 0.96  EKG shows normal sinus rhythm with rate 78 beats per minute, nonspecific ST abnormality in leads V5, V6, 1 and aVL   Outpatient Encounter Prescriptions as of 03/14/2013  Medication Sig  . ALPRAZolam (XANAX XR) 0.5 MG 24 hr tablet Take 1 tablet (0.5 mg total) by mouth at bedtime as needed.  Marland Kitchen aspirin 81 MG EC tablet Take 81 mg by mouth daily.    . Cholecalciferol (VITAMIN D3) 1000 UNITS CAPS Take 1,000 Units by mouth 1 day or 1 dose.    . Coenzyme Q10 100 MG capsule Take 100 mg by mouth daily.   Marland Kitchen HYDROcodone-acetaminophen (NORCO) 7.5-325 MG per tablet Take 1 tablet by mouth every 6 (six) hours as needed for moderate pain.  . metoprolol tartrate (LOPRESSOR) 25 MG tablet Take 0.5 tablets (12.5 mg total) by mouth 2 (two) times daily.  . Omega-3 Fatty Acids (FISH OIL BURP-LESS) 1200 MG CAPS Take by mouth 1 dose over 46 hours.    . pantoprazole (PROTONIX) 40 MG tablet TAKE ONE TABLET BY MOUTH EVERY DAY  . Probiotic Product (PROBIOTIC FORMULA) CAPS Take by mouth daily.  . rosuvastatin (CRESTOR) 40 MG tablet TAKE ONE TABLET BY MOUTH DAILY  . Triamcinolone Acetonide (TRIAMCINOLONE 0.1 % CREAM : EUCERIN) CREA Apply 1 application topically 2 (  two) times daily as needed.     Review of Systems  Constitutional: Negative.   HENT: Negative.   Eyes: Negative.   Respiratory: Negative.   Cardiovascular: Negative.   Gastrointestinal: Negative.   Endocrine: Negative.   Musculoskeletal: Positive for back pain.       Leg pain  Skin: Negative.   Allergic/Immunologic: Negative.   Neurological: Negative.   Hematological: Negative.   Psychiatric/Behavioral: Negative.   All other systems reviewed and are negative.    BP 120/70  Pulse 70  Ht 5' (1.524 m)  Wt 145 lb (65.772 kg)  BMI 28.32 kg/m2  Physical Exam  Nursing note and vitals reviewed. Constitutional: She is oriented to  person, place, and time. She appears well-developed and well-nourished.  HENT:  Head: Normocephalic.  Nose: Nose normal.  Mouth/Throat: Oropharynx is clear and moist.  Eyes: Conjunctivae are normal. Pupils are equal, round, and reactive to light.  Neck: Normal range of motion. Neck supple. No JVD present.  Cardiovascular: Normal rate, regular rhythm, S1 normal, S2 normal, normal heart sounds and intact distal pulses.  Exam reveals no gallop and no friction rub.   No murmur heard. Pulmonary/Chest: Effort normal and breath sounds normal. No respiratory distress. She has no wheezes. She has no rales. She exhibits no tenderness.  Abdominal: Soft. Bowel sounds are normal. She exhibits no distension. There is no tenderness.  Musculoskeletal: Normal range of motion. She exhibits no edema and no tenderness.  Lymphadenopathy:    She has no cervical adenopathy.  Neurological: She is alert and oriented to person, place, and time. Coordination normal.  Skin: Skin is warm and dry. No rash noted. No erythema.  Psychiatric: She has a normal mood and affect. Her behavior is normal. Judgment and thought content normal.    Assessment and Plan

## 2013-03-14 NOTE — Assessment & Plan Note (Signed)
Blood pressure is well controlled on today's visit. No changes made to the medications. 

## 2013-04-05 ENCOUNTER — Encounter: Payer: Self-pay | Admitting: Internal Medicine

## 2013-04-06 ENCOUNTER — Encounter: Payer: Self-pay | Admitting: Internal Medicine

## 2013-04-06 ENCOUNTER — Telehealth: Payer: Self-pay | Admitting: *Deleted

## 2013-04-06 MED ORDER — ALPRAZOLAM 0.5 MG PO TABS: 0.5000 mg | ORAL_TABLET | Freq: Every evening | ORAL | Status: DC | PRN

## 2013-04-06 NOTE — Telephone Encounter (Signed)
Pharmacy Note:  Insurance doesn't cover the ER tabs-can you switch to regular alprazolam?

## 2013-04-06 NOTE — Telephone Encounter (Signed)
Can you please call in generic Alprazolam 0.5mg  po qhs, #30 with 3 refills. Thanks

## 2013-04-06 NOTE — Telephone Encounter (Signed)
Prescription called in to [place] at [facility] Patient was sent a Mychart message with this information.

## 2013-04-06 NOTE — Addendum Note (Signed)
Addended by: Theola Sequin on: 04/06/2013 05:00 PM   Modules accepted: Orders

## 2013-05-31 ENCOUNTER — Encounter: Payer: Self-pay | Admitting: Internal Medicine

## 2013-06-05 ENCOUNTER — Other Ambulatory Visit: Payer: Self-pay | Admitting: *Deleted

## 2013-06-05 MED ORDER — ROSUVASTATIN CALCIUM 40 MG PO TABS: ORAL_TABLET | ORAL | Status: DC

## 2013-06-05 NOTE — Telephone Encounter (Signed)
Requested Prescriptions   Signed Prescriptions Disp Refills  . rosuvastatin (CRESTOR) 40 MG tablet 30 tablet 3    Sig: TAKE ONE TABLET BY MOUTH DAILY    Authorizing Provider: Minna Merritts    Ordering User: Britt Bottom

## 2013-06-26 ENCOUNTER — Other Ambulatory Visit: Payer: Self-pay | Admitting: Internal Medicine

## 2013-07-01 ENCOUNTER — Encounter: Payer: Self-pay | Admitting: Internal Medicine

## 2013-07-26 ENCOUNTER — Other Ambulatory Visit: Payer: Self-pay | Admitting: Internal Medicine

## 2013-07-26 NOTE — Telephone Encounter (Signed)
Rx faxed by Carol 

## 2013-07-26 NOTE — Telephone Encounter (Signed)
Okay to refill? 

## 2013-09-25 ENCOUNTER — Telehealth: Payer: Self-pay

## 2013-09-25 ENCOUNTER — Other Ambulatory Visit: Payer: Self-pay

## 2013-09-25 DIAGNOSIS — E785 Hyperlipidemia, unspecified: Secondary | ICD-10-CM

## 2013-09-25 MED ORDER — ROSUVASTATIN CALCIUM 40 MG PO TABS: ORAL_TABLET | ORAL | Status: DC

## 2013-09-25 NOTE — Telephone Encounter (Signed)
Refill sent for crestor 40 mg take one tablet daily.

## 2013-09-25 NOTE — Telephone Encounter (Signed)
Patient would like to have lab work prior to her office visit on June 25. Please advise what type of labs you recommend she have.

## 2013-10-01 NOTE — Telephone Encounter (Signed)
Lipids and lfts would be fine

## 2013-10-02 ENCOUNTER — Encounter: Payer: Self-pay | Admitting: Internal Medicine

## 2013-10-02 NOTE — Telephone Encounter (Signed)
Spoke w/ pt.  Advised her of Dr. Donivan Scull recommendation.  She will come over tomorrow for labs.

## 2013-10-03 ENCOUNTER — Other Ambulatory Visit: Payer: Medicare Other

## 2013-10-05 ENCOUNTER — Ambulatory Visit (INDEPENDENT_AMBULATORY_CARE_PROVIDER_SITE_OTHER): Payer: Medicare Other | Admitting: *Deleted

## 2013-10-05 DIAGNOSIS — E785 Hyperlipidemia, unspecified: Secondary | ICD-10-CM

## 2013-10-06 LAB — LIPID PANEL
Chol/HDL Ratio: 2 ratio units (ref 0.0–4.4)
Cholesterol, Total: 136 mg/dL (ref 100–199)
HDL: 67 mg/dL (ref >39)
LDL CALC: 55 mg/dL (ref 0–99)
Triglycerides: 70 mg/dL (ref 0–149)
VLDL CHOLESTEROL CAL: 14 mg/dL (ref 5–40)

## 2013-10-06 LAB — HEPATIC FUNCTION PANEL
ALT: 30 IU/L (ref 0–32)
AST: 34 IU/L (ref 0–40)
Albumin: 4.3 g/dL (ref 3.6–4.8)
Alkaline Phosphatase: 62 IU/L (ref 39–117)
Bilirubin, Direct: 0.12 mg/dL (ref 0.00–0.40)
Total Bilirubin: 0.4 mg/dL (ref 0.0–1.2)
Total Protein: 6.5 g/dL (ref 6.0–8.5)

## 2013-10-09 ENCOUNTER — Telehealth: Payer: Self-pay

## 2013-10-09 NOTE — Telephone Encounter (Signed)
Pt would like lab results. Please call. 

## 2013-10-10 NOTE — Telephone Encounter (Signed)
Reviewed results w/ pt.  

## 2013-10-12 ENCOUNTER — Encounter: Payer: Self-pay | Admitting: Cardiovascular Disease

## 2013-10-12 ENCOUNTER — Ambulatory Visit (INDEPENDENT_AMBULATORY_CARE_PROVIDER_SITE_OTHER): Payer: Medicare Other | Admitting: Cardiovascular Disease

## 2013-10-12 VITALS — BP 122/80 | HR 73 | Ht 60.0 in | Wt 145.8 lb

## 2013-10-12 DIAGNOSIS — I739 Peripheral vascular disease, unspecified: Secondary | ICD-10-CM

## 2013-10-12 DIAGNOSIS — R002 Palpitations: Secondary | ICD-10-CM

## 2013-10-12 DIAGNOSIS — I10 Essential (primary) hypertension: Secondary | ICD-10-CM

## 2013-10-12 DIAGNOSIS — G629 Polyneuropathy, unspecified: Secondary | ICD-10-CM

## 2013-10-12 DIAGNOSIS — I25810 Atherosclerosis of coronary artery bypass graft(s) without angina pectoris: Secondary | ICD-10-CM

## 2013-10-12 DIAGNOSIS — E785 Hyperlipidemia, unspecified: Secondary | ICD-10-CM

## 2013-10-12 DIAGNOSIS — G589 Mononeuropathy, unspecified: Secondary | ICD-10-CM

## 2013-10-12 MED ORDER — GABAPENTIN 100 MG PO CAPS: 100.0000 mg | ORAL_CAPSULE | Freq: Three times a day (TID) | ORAL | Status: DC | PRN

## 2013-10-12 NOTE — Assessment & Plan Note (Signed)
Currently with no symptoms of angina. No further workup at this time. Continue current medication regimen. 

## 2013-10-12 NOTE — Progress Notes (Signed)
Patient ID: [name], female    DOB: 03/28/1946, 68 y.o.   MRN: [id number]  HPI Comments: [name] is a pleasant 68 year old woman with a history of coronary artery disease, stent to her left circumflex and 2 stents to the LAD in March of 2011, aorto femoral bypass grafting in early October 2011,  with occlusion of her coronary stents in mid-October requiring bypass surgery at [facility] in mid to late October 2012. She presents for routine followup.  History of thyroid surgery March 2014  long history of smoking though stopped in March of 2011  plastic surgery on her face in the past  In followup today, she reports that weight continues to be a problem. Particularly around her middle area. She is starting to do more exercise, now has a Fitbit.  She denies any significant shortness of breath or chest pain with exertion. She is able to do aerobic exercise 30-40 minutes at a time. Recently overdid it on her exercise bike and now having severe pain below her left knee down to her foot, pins and needles described as a sharp shooting nerve. Previously was on Neurontin with improvement of her symptoms. She is requesting refill of her Neurontin prescription for symptoms   cardiac catheterization showed LIMA to the LAD, vein graft to the left circumflex, vein graft to the PDA which is occluded that supplies a very small area. Left main has 50% disease, ostial LAD has 95% in-stent restenosis, mid LAD has 100% disease, 75% ostial circumflex, 100% mid circumflex, very small OM1, 30% RCA disease, ejection fraction 45%  Stress test shows suboptimal images due to GI activity on resting images, significant anterior wall ischemia in the mid to distal segments, reduced ejection fraction of 47%     Prior ventricular arrhythmia when her stents were placed in the Cath Lab requiring shock.    echocardiogram improved, EF  greater than 40%.   Cardiac catheter report from July 15 2009  indicate she had a vision bare  metal stent 2.75 x 28 mm in the LCX, mini vision bare metal stent 2.5 x 28 and 2.5 x 15 mm stent to the LAD.   DJD in her neck and back Recent lab work showing total cholesterol 134, HDL 69, LDL 52, creatinine 0.96  EKG shows normal sinus rhythm with rate 73 beats per minute, nonspecific ST abnormality in leads V5, V6, 1 and aVL   Outpatient Encounter Prescriptions as of 10/12/2013  Medication Sig  . ALPRAZolam (XANAX) 0.5 MG tablet TAKE ONE TABLET DAILY AT BEDTIME  . aspirin 81 MG EC tablet Take 81 mg by mouth daily.    . Cholecalciferol (VITAMIN D3) 1000 UNITS CAPS Take 1,000 Units by mouth 1 day or 1 dose.    . Coenzyme Q10 100 MG capsule Take 100 mg by mouth daily.   Marland Kitchen HYDROcodone-acetaminophen (NORCO) 7.5-325 MG per tablet Take 1 tablet by mouth every 6 (six) hours as needed for moderate pain.  . metoprolol tartrate (LOPRESSOR) 25 MG tablet Take 0.5 tablets (12.5 mg total) by mouth 2 (two) times daily.  . Omega-3 Fatty Acids (FISH OIL BURP-LESS) 1200 MG CAPS Take by mouth 1 dose over 46 hours.    . pantoprazole (PROTONIX) 40 MG tablet TAKE ONE (1) TABLET BY MOUTH EVERY DAY  . Probiotic Product (PROBIOTIC FORMULA) CAPS Take by mouth daily.  . rosuvastatin (CRESTOR) 40 MG tablet TAKE ONE TABLET BY MOUTH DAILY  . Triamcinolone Acetonide (TRIAMCINOLONE 0.1 % CREAM : EUCERIN) CREA  Apply 1 application topically 2 (two) times daily as needed.     Review of Systems  Constitutional: Negative.   HENT: Negative.   Eyes: Negative.   Respiratory: Negative.   Cardiovascular: Negative.   Gastrointestinal: Negative.   Endocrine: Negative.   Musculoskeletal: Positive for back pain.       Leg pain  Skin: Negative.   Allergic/Immunologic: Negative.   Neurological: Negative.   Hematological: Negative.   Psychiatric/Behavioral: Negative.   All other systems reviewed and are negative.   BP 122/80  Pulse 73  Ht 5' (1.524 m)  Wt 145 lb 12 oz (66.112 kg)  BMI 28.46 kg/m2  Physical Exam   Nursing note and vitals reviewed. Constitutional: She is oriented to person, place, and time. She appears well-developed and well-nourished.  HENT:  Head: Normocephalic.  Nose: Nose normal.  Mouth/Throat: Oropharynx is clear and moist.  Eyes: Conjunctivae are normal. Pupils are equal, round, and reactive to light.  Neck: Normal range of motion. Neck supple. No JVD present.  Cardiovascular: Normal rate, regular rhythm, S1 normal, S2 normal, normal heart sounds and intact distal pulses.  Exam reveals no gallop and no friction rub.   No murmur heard. Pulmonary/Chest: Effort normal and breath sounds normal. No respiratory distress. She has no wheezes. She has no rales. She exhibits no tenderness.  Abdominal: Soft. Bowel sounds are normal. She exhibits no distension. There is no tenderness.  Musculoskeletal: Normal range of motion. She exhibits no edema and no tenderness.  Lymphadenopathy:    She has no cervical adenopathy.  Neurological: She is alert and oriented to person, place, and time. Coordination normal.  Skin: Skin is warm and dry. No rash noted. No erythema.  Psychiatric: She has a normal mood and affect. Her behavior is normal. Judgment and thought content normal.    Assessment and Plan

## 2013-10-12 NOTE — Assessment & Plan Note (Signed)
She has requested a description for Neurontin for nerve pain in her leg. We have given her 30 days of medication. Suggested she talk about this with Dr. walker

## 2013-10-12 NOTE — Assessment & Plan Note (Signed)
Cholesterol is at goal on the current lipid regimen. No changes to the medications were made.  

## 2013-10-12 NOTE — Patient Instructions (Signed)
You are doing well. OK to try gabapentin for nerve pain in the leg  Please call us if you have new issues that need to be addressed before your next appt.  Your physician wants you to follow-up in: 6 months.  You will receive a reminder letter in the mail two months in advance. If you don't receive a letter, please call our office to schedule the follow-up appointment.

## 2013-10-12 NOTE — Assessment & Plan Note (Signed)
She denies any claudication-type symptoms at this time. We'll continue aggressive cholesterol management

## 2013-11-07 ENCOUNTER — Encounter: Payer: Self-pay | Admitting: Internal Medicine

## 2013-11-14 LAB — HM DEXA SCAN

## 2013-11-15 ENCOUNTER — Encounter: Payer: Self-pay | Admitting: *Deleted

## 2013-11-21 ENCOUNTER — Encounter: Payer: Self-pay | Admitting: Internal Medicine

## 2013-11-23 ENCOUNTER — Emergency Department: Payer: Self-pay | Admitting: Emergency Medicine

## 2013-11-23 ENCOUNTER — Telehealth: Payer: Self-pay | Admitting: *Deleted

## 2013-11-23 IMAGING — US US EXTREM LOW VENOUS*L*
1 series · 14 of 24 positions shown · non-contrast
Comparison: None.

CLINICAL DATA: Left leg pain.



[Series 1: us extrem low venous*left* · 0.08mm/px · 14 of 32 slices shown]
[im 1/32]
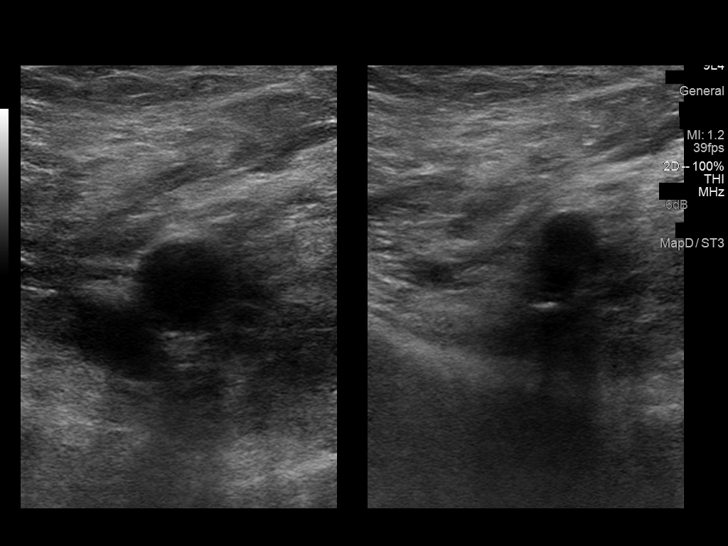
[im 3/32]
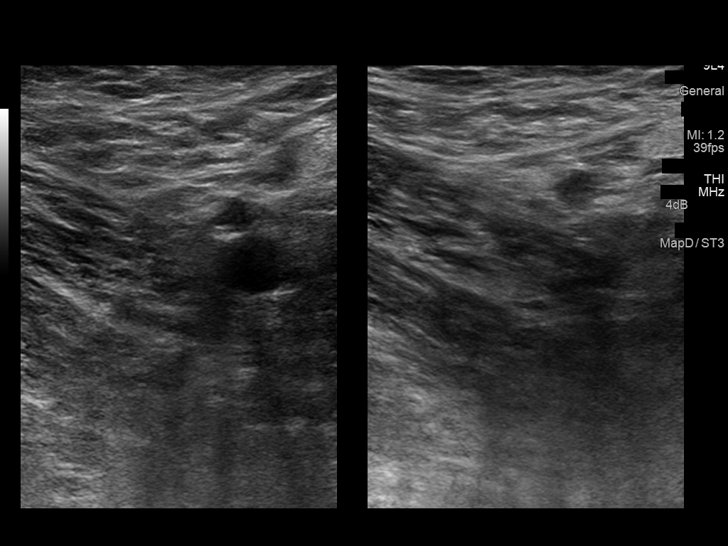
[im 6/32]
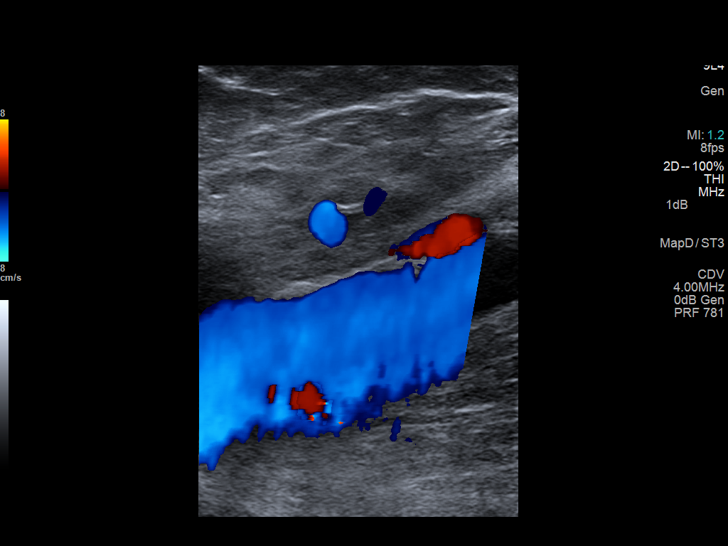
[im 9/32]
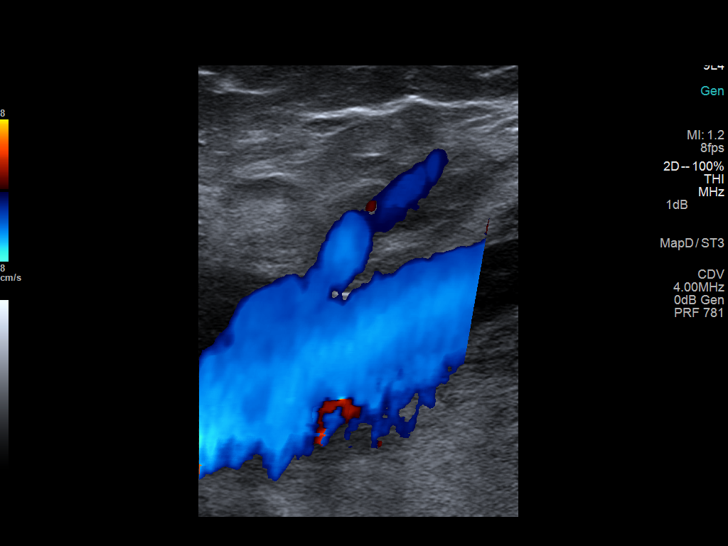
[im 10/32]
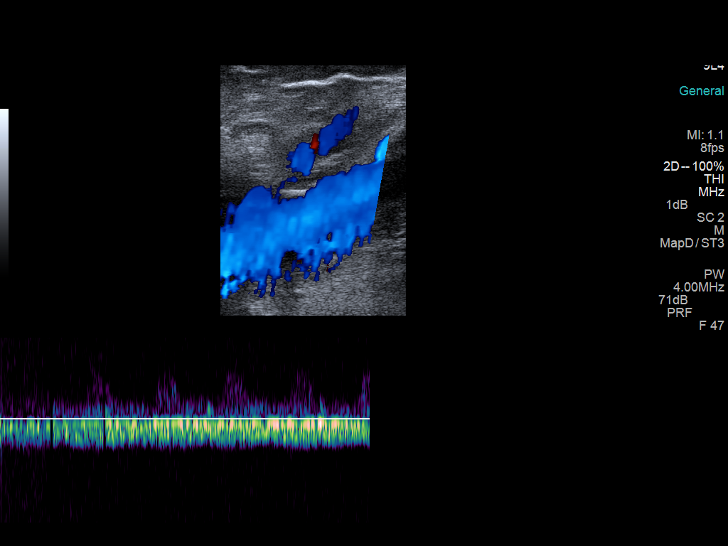
[im 13/32]
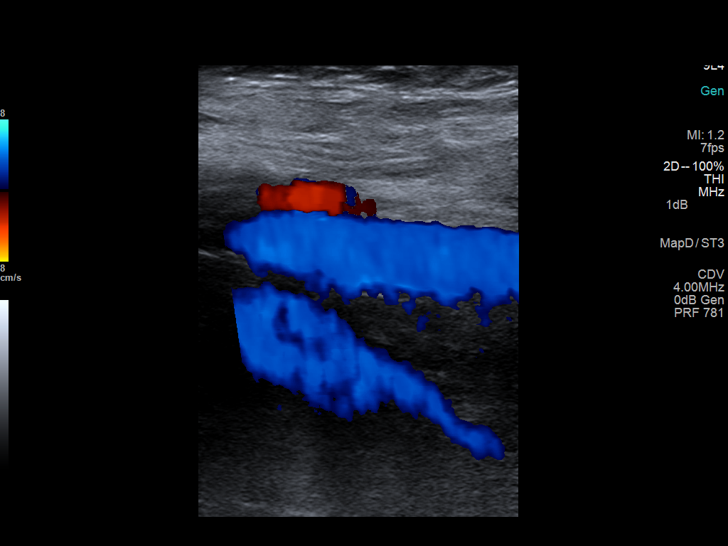
[im 15/32]
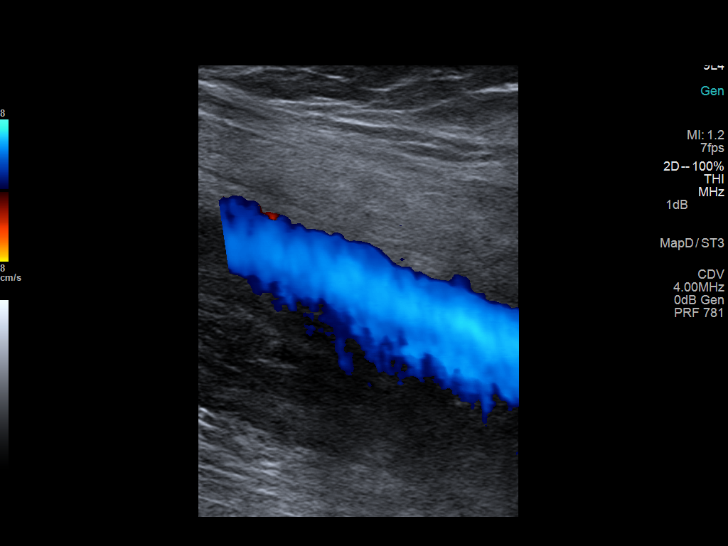
[im 17/32]
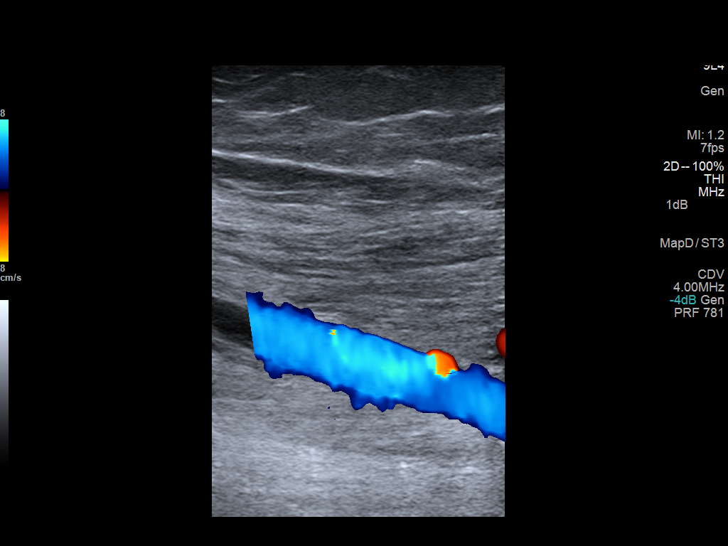
[im 19/32]
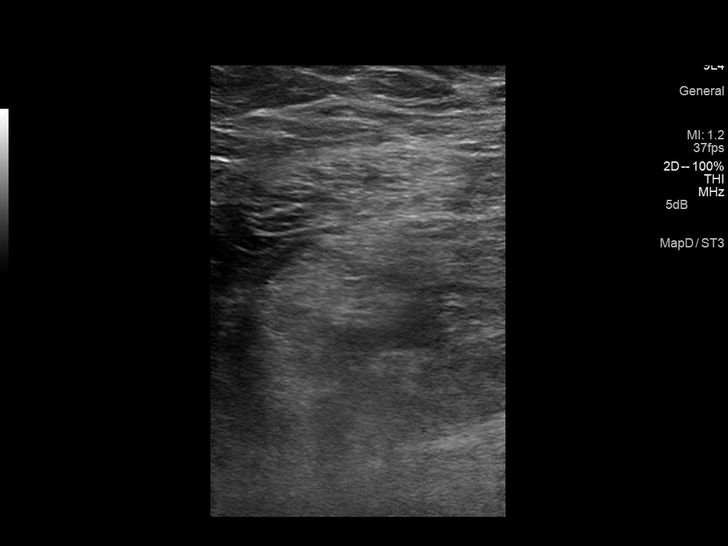
[im 22/32]
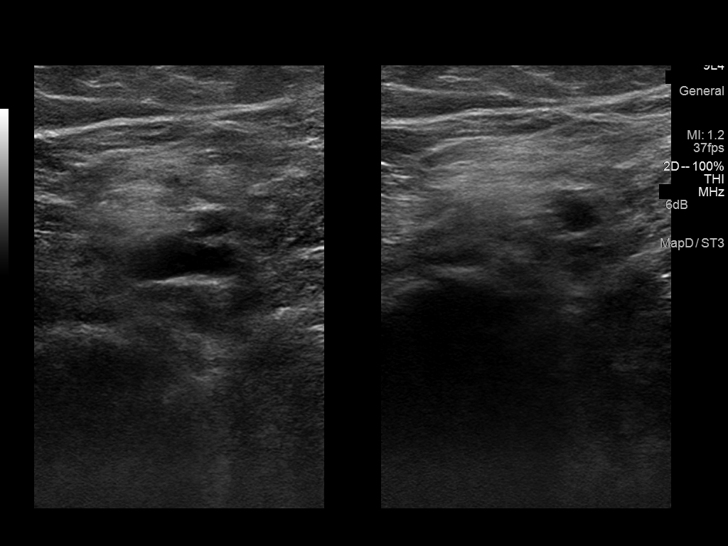
[im 25/32]
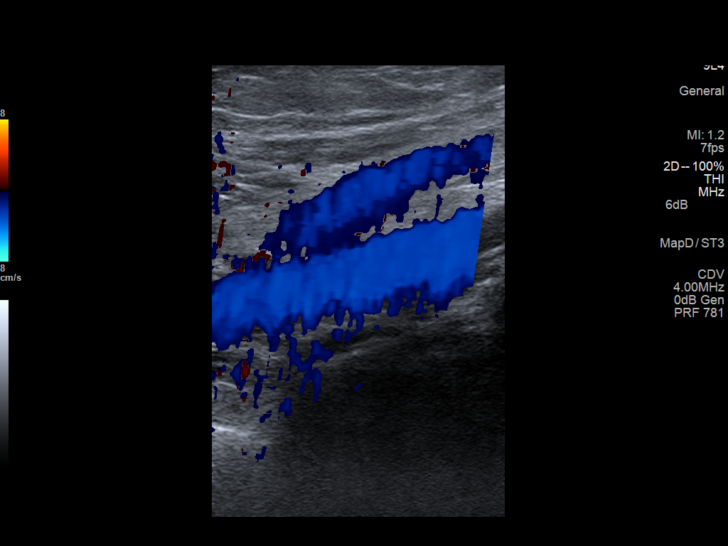
[im 26/32]
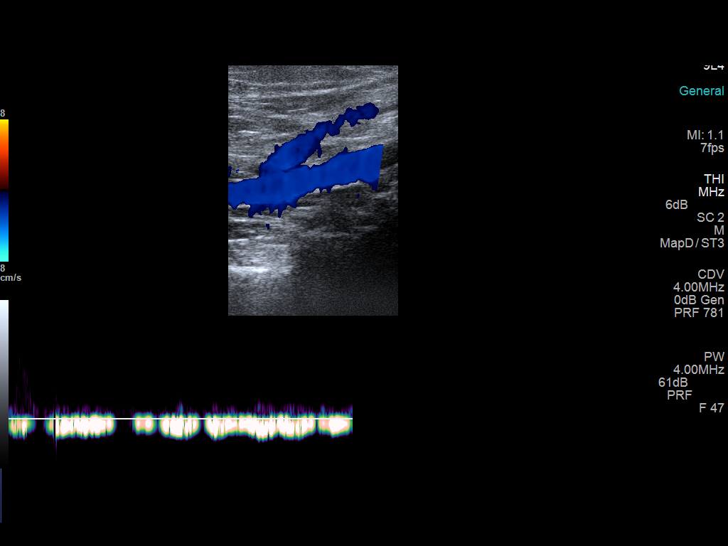
[im 29/32]
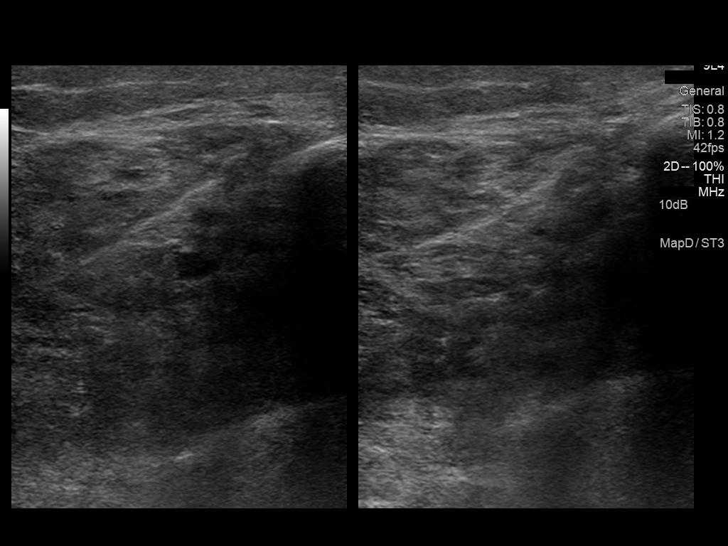
[im 32/32]
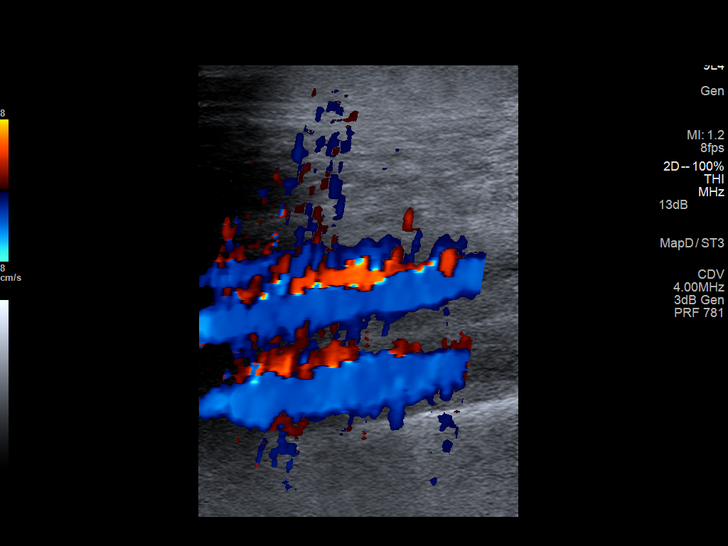

[14 of 24 positions shown; findings below may reference images not displayed]

FINDINGS: Common Femoral Vein: No evidence of thrombus.

Saphenofemoral Junction: No evidence of thrombus.

Profunda Femoral Vein: No evidence of thrombus.

Femoral Vein: No evidence of thrombus.

Popliteal Vein: No evidence of thrombus.

Calf Veins: No evidence of thrombus.

Superficial Great Saphenous Vein (only the proximal portion
visible): No evidence of thrombus.
IMPRESSION: Negative for left lower extremity deep venous thrombosis.

## 2013-11-23 NOTE — Telephone Encounter (Signed)
Uncertain, as CABG was 4 years ago. Unclear why it would happen now. Happy to see in clinic. If from vein surgery, probably little can be done. Sometimes a nerve pill if sx get worse. May want to talk with PMD

## 2013-11-23 NOTE — Telephone Encounter (Signed)
Patient called and having problems left leg. Having numbness and tingling where they took the vein for her heart surgery in 2011. Please call patient.

## 2013-11-23 NOTE — Telephone Encounter (Signed)
Patient is having numbness and tingling in her left leg  She thinks it is related to "where they took the vein for my heart surgery" She is not experiencing any other symptoms or any other numbness and tingling  She denies swelling, tempeture difference, or difference in color    Informed patient that Dr. Rockey Situ was gone for the day and this call will be addressed tomorrow  Instructed patient to contact ems if she feels her situation becomes emergent  Patient verbalized understanding

## 2013-11-24 ENCOUNTER — Other Ambulatory Visit: Payer: Self-pay | Admitting: Internal Medicine

## 2013-11-24 NOTE — Telephone Encounter (Signed)
Rx faxed to pharmacy  

## 2013-11-24 NOTE — Telephone Encounter (Signed)
Last refill 7.15.15, last OV 8.7.14, next OV 8.25.15.  Please advise refill.

## 2013-11-24 NOTE — Telephone Encounter (Signed)
Spoke w/ pt.  Advised her of Dr. Donivan Scull recommendation.  She reports that she was worried about having a stroke and went to the ED last night at [facility]. Reports that she was diagnosed w/ sciatica and degenerative disc disease.  Pt will speak w/ her PCP and orthopedist, possibly a chiropractor.  Asked her to call if we can assist her further.

## 2013-11-24 NOTE — Telephone Encounter (Signed)
May refill #30 no refills only until seen in visit.

## 2013-12-04 ENCOUNTER — Telehealth: Payer: Self-pay | Admitting: Internal Medicine

## 2013-12-04 NOTE — Telephone Encounter (Signed)
Please see below note

## 2013-12-04 NOTE — Telephone Encounter (Signed)
OK. Form handed to you.

## 2013-12-04 NOTE — Telephone Encounter (Signed)
Faxed lab order to [facility] on [address]

## 2013-12-04 NOTE — Telephone Encounter (Signed)
And would also like to have her A1C drawn as well

## 2013-12-04 NOTE — Telephone Encounter (Signed)
Pt called in and stated is having a physical done next Tuesday 8/25 and would like to go to [facility] tomorrow 8/18 and have her labs drawn before hand.

## 2013-12-07 NOTE — Telephone Encounter (Signed)
Received lab results from [facility].

## 2013-12-08 ENCOUNTER — Encounter: Payer: Self-pay | Admitting: Internal Medicine

## 2013-12-12 ENCOUNTER — Ambulatory Visit: Payer: Self-pay | Admitting: Internal Medicine

## 2013-12-13 ENCOUNTER — Encounter: Payer: Self-pay | Admitting: Internal Medicine

## 2013-12-21 ENCOUNTER — Encounter: Payer: Self-pay | Admitting: Internal Medicine

## 2013-12-22 ENCOUNTER — Encounter: Payer: Self-pay | Admitting: Family Medicine

## 2013-12-22 ENCOUNTER — Ambulatory Visit (INDEPENDENT_AMBULATORY_CARE_PROVIDER_SITE_OTHER): Payer: Medicare Other | Admitting: Family Medicine

## 2013-12-22 VITALS — BP 120/70 | HR 91 | Temp 98.2°F | Ht 60.0 in | Wt 140.5 lb

## 2013-12-22 DIAGNOSIS — N898 Other specified noninflammatory disorders of vagina: Secondary | ICD-10-CM | POA: Insufficient documentation

## 2013-12-22 DIAGNOSIS — N899 Noninflammatory disorder of vagina, unspecified: Secondary | ICD-10-CM

## 2013-12-22 NOTE — Assessment & Plan Note (Signed)
Possible early ulceration, but abrasion from intercourse with vaginal dryness from post menopausal state possible. Send viral culture. Treat with topical barrier.  Pt denies desire for other STD testing at this time.

## 2013-12-22 NOTE — Progress Notes (Signed)
   Subjective:    Patient ID: [name], female    DOB: [birth year], 68 y.o.   MRN: [id number]  HPI  68 year old post menopausal female  pt of Dr. Thomes Dinning with history of  CAD, HTN, PVD presents for  concern about STDs. She is sexually active with a partner who is a Administrator.  She is concerned about STD exposure.  Not a new partner, but hadn't been sexually active in a while.  She had intercourse 4 days ago.  She had a little of bleeding after intercourse, she had some vaginal dryness during sex.  Partner did not have oral sex with her.  No change in vaginal discharge. She has noted burning on outside upper vaginal area, one specific area. Started 3 days after sex. No vaginal itching. No abdominal pain. No fever.  No previous STDs.     Review of Systems  Constitutional: Negative for fever and fatigue.  HENT: Negative for ear pain.   Eyes: Negative for pain.  Respiratory: Negative for chest tightness and shortness of breath.   Cardiovascular: Negative for chest pain, palpitations and leg swelling.  Gastrointestinal: Negative for abdominal pain.  Genitourinary: Negative for dysuria.       Objective:   Physical Exam  Constitutional: She appears well-developed and well-nourished.  HENT:  Head: Normocephalic.  Cardiovascular: Normal rate and regular rhythm.   No murmur heard. Pulmonary/Chest: Effort normal.  Abdominal: Soft. There is no tenderness.  Genitourinary:    There is lesion on the right labia. There is lesion on the left labia.  Area of redness and irritation, tender to swab          Assessment & Plan:

## 2013-12-22 NOTE — Progress Notes (Signed)
Pre visit review using our clinic review tool, if applicable. No additional management support is needed unless otherwise documented below in the visit note. 

## 2013-12-25 LAB — HERPES SIMPLEX VIRUS CULTURE: ORGANISM ID, BACTERIA: NOT DETECTED

## 2013-12-25 NOTE — Telephone Encounter (Signed)
Butch Penny- This pt got the wrong AVS.. Can you call Vertis Kelch (the pt whos paperwork she got) and ask her if she got the right paperwork? I doubt she got Ms. Releford's paperwork, but can you have her destroy it if she did? Can you update Ms. Stahnke about it. Also send Ms. Journey her correct AVS.

## 2013-12-26 ENCOUNTER — Other Ambulatory Visit: Payer: Self-pay | Admitting: Internal Medicine

## 2013-12-26 NOTE — Patient Instructions (Addendum)
We will call with lab results.  You can apply topical barrier like KY jelly or desitin to affected area.

## 2013-12-27 ENCOUNTER — Other Ambulatory Visit: Payer: Self-pay | Admitting: *Deleted

## 2013-12-27 ENCOUNTER — Telehealth: Payer: Self-pay | Admitting: *Deleted

## 2013-12-27 MED ORDER — METOPROLOL TARTRATE 25 MG PO TABS: 12.5000 mg | ORAL_TABLET | Freq: Two times a day (BID) | ORAL | Status: DC

## 2013-12-27 MED ORDER — PANTOPRAZOLE SODIUM 40 MG PO TBEC: DELAYED_RELEASE_TABLET | ORAL | Status: DC

## 2013-12-27 MED ORDER — ALPRAZOLAM 0.5 MG PO TABS: ORAL_TABLET | ORAL | Status: DC

## 2013-12-27 NOTE — Telephone Encounter (Signed)
Please see below.

## 2013-12-27 NOTE — Telephone Encounter (Signed)
Pt called requesting Alprazolam refill.  Last refill 8.7.15, last OV 8.7.14.  Next OV 9.17.15.  Please advise refill

## 2013-12-27 NOTE — Telephone Encounter (Signed)
Fine to refill 

## 2013-12-27 NOTE — Telephone Encounter (Signed)
Requested Prescriptions   Signed Prescriptions Disp Refills  . metoprolol tartrate (LOPRESSOR) 25 MG tablet 90 tablet 4    Sig: Take 0.5 tablets (12.5 mg total) by mouth 2 (two) times daily.    Authorizing Provider: Minna Merritts    Ordering User: Britt Bottom

## 2013-12-28 ENCOUNTER — Encounter: Payer: Self-pay | Admitting: Internal Medicine

## 2013-12-30 ENCOUNTER — Encounter: Payer: Self-pay | Admitting: Family Medicine

## 2013-12-31 ENCOUNTER — Encounter: Payer: Self-pay | Admitting: Family Medicine

## 2014-01-04 ENCOUNTER — Encounter: Payer: Self-pay | Admitting: Internal Medicine

## 2014-01-04 ENCOUNTER — Ambulatory Visit (INDEPENDENT_AMBULATORY_CARE_PROVIDER_SITE_OTHER): Payer: Medicare Other | Admitting: Internal Medicine

## 2014-01-04 VITALS — BP 108/60 | HR 70 | Temp 97.5°F | Ht 60.5 in | Wt 138.8 lb

## 2014-01-04 DIAGNOSIS — Z0000 Encounter for general adult medical examination without abnormal findings: Secondary | ICD-10-CM | POA: Insufficient documentation

## 2014-01-04 DIAGNOSIS — Z23 Encounter for immunization: Secondary | ICD-10-CM

## 2014-01-04 DIAGNOSIS — Z113 Encounter for screening for infections with a predominantly sexual mode of transmission: Secondary | ICD-10-CM

## 2014-01-04 MED ORDER — PANTOPRAZOLE SODIUM 40 MG PO TBEC: DELAYED_RELEASE_TABLET | ORAL | Status: DC

## 2014-01-04 MED ORDER — ALPRAZOLAM 0.5 MG PO TABS: ORAL_TABLET | ORAL | Status: DC

## 2014-01-04 NOTE — Assessment & Plan Note (Signed)
General medical exam including breast exam normal today. PAP and pelvic deferred as PAP normal, HPV neg in 2013. Will repeat PAP in 2016. Mammogram scheduled. Colonoscopy UTD. Reviewed labs including CMP, lipids, A1c with pt. Will repeat electrolytes in 3 months.

## 2014-01-04 NOTE — Addendum Note (Signed)
Addended by: Vernetta Honey on: 01/04/2014 04:59 PM   Modules accepted: Orders

## 2014-01-04 NOTE — Progress Notes (Signed)
Pre visit review using our clinic review tool, if applicable. No additional management support is needed unless otherwise documented below in the visit note. 

## 2014-01-04 NOTE — Progress Notes (Signed)
The patient is here for annual Medicare Wellness Examination and management of other chronic and acute problems.   The risk factors are reflected in the history.  The roster of all physicians providing medical care to patient - is listed in the Snapshot section of the chart.  Activities of daily living:   The patient is 100% independent in all ADLs: dressing, toileting, feeding as well as independent mobility. Patient lives alone. Lives in apartment. No pets  Home safety :  The patient has smoke detectors in the home.  They wear seatbelts in their car. There are no firearms at home.  There is no violence in the home. They feel safe where they live.  Infectious Risks: There are risks for hepatitis, STDs or HIV.  Recent herpes testing negative. There is no  history of blood transfusion.  They have no travel history to infectious disease endemic areas of the world.  Additional Health Care Providers: The patient has seen their dentist in the last six months. Dentist - Dr. Kenton Kingfisher They have seen their eye doctor in the last year. Opthalmologist - Dr. Ellin Mayhew They deny hearing issues. They have deferred audiologic testing in the last year.   They do not  have excessive sun exposure. Discussed the need for sun protection: hats,long sleeves and use of sunscreen if there is significant sun exposure.  Dermatologist - Dr. Margreta Journey  Diet: the importance of a healthy diet is discussed. They do have a healthy diet. Starting [employer] program.  The benefits of regular aerobic exercise were discussed. Patient exercises by going to gym several days per week.  Depression screen: there are no signs or vegative symptoms of depression- irritability, change in appetite, anhedonia, sadness/tearfullness.  Cognitive assessment: the patient manages all their financial and personal affairs and is actively engaged. They could relate day,date,year and events.  [place], best friend  The  following portions of the patient's history were reviewed and updated as appropriate: allergies, current medications, past family history, past medical history,  past surgical history, past social history and problem list.  Visual acuity was not assessed per patient preference as they have regular follow up with their ophthalmologist. Hearing and body mass index were assessed and reviewed.   During the course of the visit the patient was educated and counseled about appropriate screening and preventive services including : fall prevention , diabetes screening, nutrition counseling, colorectal cancer screening, and recommended immunizations.    Review of Systems  Constitutional: Negative for fever, chills, appetite change, fatigue and unexpected weight change.  Eyes: Negative for visual disturbance.  Respiratory: Negative for shortness of breath.   Cardiovascular: Negative for chest pain and leg swelling.  Gastrointestinal: Negative for nausea, vomiting, abdominal pain, diarrhea, constipation and blood in stool.  Genitourinary: Negative for frequency, vaginal bleeding, vaginal discharge, genital sores and vaginal pain.  Musculoskeletal: Negative for arthralgias and myalgias.  Skin: Negative for color change and rash.  Hematological: Negative for adenopathy. Does not bruise/bleed easily.  Psychiatric/Behavioral: Negative for sleep disturbance and dysphoric mood. The patient is not nervous/anxious.        Objective:    BP 108/60  Pulse 70  Temp(Src) 97.5 F (36.4 C) (Oral)  Ht 5' 0.5" (1.537 m)  Wt 138 lb 12 oz (62.937 kg)  BMI 26.64 kg/m2  SpO2 98% Physical Exam  Constitutional: She is oriented to person, place, and time. She appears well-developed and well-nourished. No distress.  HENT:  Head: Normocephalic and atraumatic.  Right Ear:  External ear normal.  Left Ear: External ear normal.  Nose: Nose normal.  Mouth/Throat: Oropharynx is clear and moist. No oropharyngeal exudate.   Eyes: Conjunctivae are normal. Pupils are equal, round, and reactive to light. Right eye exhibits no discharge. Left eye exhibits no discharge. No scleral icterus.  Neck: Normal range of motion. Neck supple. No tracheal deviation present. No thyromegaly present.  Cardiovascular: Normal rate, regular rhythm, normal heart sounds and intact distal pulses.  Exam reveals no gallop and no friction rub.   No murmur heard. Pulmonary/Chest: Effort normal and breath sounds normal. No accessory muscle usage. Not tachypneic. No respiratory distress. She has no decreased breath sounds. She has no wheezes. She has no rales. She exhibits no tenderness. Right breast exhibits no inverted nipple, no mass, no nipple discharge, no skin change and no tenderness. Left breast exhibits no inverted nipple, no mass, no nipple discharge, no skin change and no tenderness. Breasts are symmetrical.  Abdominal: Soft. Bowel sounds are normal. She exhibits no distension and no mass. There is no tenderness. There is no rebound and no guarding.  Musculoskeletal: Normal range of motion. She exhibits no edema and no tenderness.  Lymphadenopathy:    She has no cervical adenopathy.  Neurological: She is alert and oriented to person, place, and time. No cranial nerve deficit. She exhibits normal muscle tone. Coordination normal.  Skin: Skin is warm and dry. No rash noted. She is not diaphoretic. No erythema. No pallor.  Psychiatric: She has a normal mood and affect. Her behavior is normal. Judgment and thought content normal.          Assessment & Plan:   Problem List Items Addressed This Visit     Unprioritized   Medicare annual wellness visit, subsequent - Primary     General medical exam including breast exam normal today. PAP and pelvic deferred as PAP normal, HPV neg in 2013. Will repeat PAP in 2016. Mammogram scheduled. Colonoscopy UTD. Reviewed labs including CMP, lipids, A1c with pt. Will repeat electrolytes in 3  months.     Other Visit Diagnoses   Screening for STD (sexually transmitted disease)        Relevant Orders       GC/chlamydia probe amp, urine        Return in about 6 months (around 07/05/2014) for Recheck.

## 2014-01-04 NOTE — Patient Instructions (Signed)
Please repeat labs including electrolytes in 1 month.  Health Maintenance Adopting a healthy lifestyle and getting preventive care can go a long way to promote health and wellness. Talk with your health care provider about what schedule of regular examinations is right for you. This is a good chance for you to check in with your provider about disease prevention and staying healthy. In between checkups, there are plenty of things you can do on your own. Experts have done a lot of research about which lifestyle changes and preventive measures are most likely to keep you healthy. Ask your health care provider for more information. WEIGHT AND DIET  Eat a healthy diet  Be sure to include plenty of vegetables, fruits, low-fat dairy products, and lean protein.  Do not eat a lot of foods high in solid fats, added sugars, or salt.  Get regular exercise. This is one of the most important things you can do for your health.  Most adults should exercise for at least 150 minutes each week. The exercise should increase your heart rate and make you sweat (moderate-intensity exercise).  Most adults should also do strengthening exercises at least twice a week. This is in addition to the moderate-intensity exercise.  Maintain a healthy weight  Body mass index (BMI) is a measurement that can be used to identify possible weight problems. It estimates body fat based on height and weight. Your health care provider can help determine your BMI and help you achieve or maintain a healthy weight.  For females 43 years of age and older:   A BMI below 18.5 is considered underweight.  A BMI of 18.5 to 24.9 is normal.  A BMI of 25 to 29.9 is considered overweight.  A BMI of 30 and above is considered obese.  Watch levels of cholesterol and blood lipids  You should start having your blood tested for lipids and cholesterol at 66 years of age, then have this test every 5 years.  You may need to have your  cholesterol levels checked more often if:  Your lipid or cholesterol levels are high.  You are older than 66 years of age.  You are at high risk for heart disease.  CANCER SCREENING   Lung Cancer  Lung cancer screening is recommended for adults 45-69 years old who are at high risk for lung cancer because of a history of smoking.  A yearly low-dose CT scan of the lungs is recommended for people who:  Currently smoke.  Have quit within the past 15 years.  Have at least a 30-pack-year history of smoking. A pack year is smoking an average of one pack of cigarettes a day for 1 year.  Yearly screening should continue until it has been 15 years since you quit.  Yearly screening should stop if you develop a health problem that would prevent you from having lung cancer treatment.  Breast Cancer  Practice breast self-awareness. This means understanding how your breasts normally appear and feel.  It also means doing regular breast self-exams. Let your health care provider know about any changes, no matter how small.  If you are in your 20s or 30s, you should have a clinical breast exam (CBE) by a health care provider every 1-3 years as part of a regular health exam.  If you are 2 or older, have a CBE every year. Also consider having a breast X-ray (mammogram) every year.  If you have a family history of breast cancer, talk to your health  care provider about genetic screening.  If you are at high risk for breast cancer, talk to your health care provider about having an MRI and a mammogram every year.  Breast cancer gene (BRCA) assessment is recommended for women who have family members with BRCA-related cancers. BRCA-related cancers include:  Breast.  Ovarian.  Tubal.  Peritoneal cancers.  Results of the assessment will determine the need for genetic counseling and BRCA1 and BRCA2 testing. Cervical Cancer Routine pelvic examinations to screen for cervical cancer are no  longer recommended for nonpregnant women who are considered low risk for cancer of the pelvic organs (ovaries, uterus, and vagina) and who do not have symptoms. A pelvic examination may be necessary if you have symptoms including those associated with pelvic infections. Ask your health care provider if a screening pelvic exam is right for you.   The Pap test is the screening test for cervical cancer for women who are considered at risk.  If you had a hysterectomy for a problem that was not cancer or a condition that could lead to cancer, then you no longer need Pap tests.  If you are older than 65 years, and you have had normal Pap tests for the past 10 years, you no longer need to have Pap tests.  If you have had past treatment for cervical cancer or a condition that could lead to cancer, you need Pap tests and screening for cancer for at least 20 years after your treatment.  If you no longer get a Pap test, assess your risk factors if they change (such as having a new sexual partner). This can affect whether you should start being screened again.  Some women have medical problems that increase their chance of getting cervical cancer. If this is the case for you, your health care provider may recommend more frequent screening and Pap tests.  The human papillomavirus (HPV) test is another test that may be used for cervical cancer screening. The HPV test looks for the virus that can cause cell changes in the cervix. The cells collected during the Pap test can be tested for HPV.  The HPV test can be used to screen women 26 years of age and older. Getting tested for HPV can extend the interval between normal Pap tests from three to five years.  An HPV test also should be used to screen women of any age who have unclear Pap test results.  After 66 years of age, women should have HPV testing as often as Pap tests.  Colorectal Cancer  This type of cancer can be detected and often  prevented.  Routine colorectal cancer screening usually begins at 66 years of age and continues through 66 years of age.  Your health care provider may recommend screening at an earlier age if you have risk factors for colon cancer.  Your health care provider may also recommend using home test kits to check for hidden blood in the stool.  A small camera at the end of a tube can be used to examine your colon directly (sigmoidoscopy or colonoscopy). This is done to check for the earliest forms of colorectal cancer.  Routine screening usually begins at age 65.  Direct examination of the colon should be repeated every 5-10 years through 66 years of age. However, you may need to be screened more often if early forms of precancerous polyps or small growths are found. Skin Cancer  Check your skin from head to toe regularly.  Tell your health  care provider about any new moles or changes in moles, especially if there is a change in a mole's shape or color.  Also tell your health care provider if you have a mole that is larger than the size of a pencil eraser.  Always use sunscreen. Apply sunscreen liberally and repeatedly throughout the day.  Protect yourself by wearing long sleeves, pants, a wide-brimmed hat, and sunglasses whenever you are outside. HEART DISEASE, DIABETES, AND HIGH BLOOD PRESSURE   Have your blood pressure checked at least every 1-2 years. High blood pressure causes heart disease and increases the risk of stroke.  If you are between 78 years and 37 years old, ask your health care provider if you should take aspirin to prevent strokes.  Have regular diabetes screenings. This involves taking a blood sample to check your fasting blood sugar level.  If you are at a normal weight and have a low risk for diabetes, have this test once every three years after 67 years of age.  If you are overweight and have a high risk for diabetes, consider being tested at a younger age or more  often. PREVENTING INFECTION  Hepatitis B  If you have a higher risk for hepatitis B, you should be screened for this virus. You are considered at high risk for hepatitis B if:  You were born in a country where hepatitis B is common. Ask your health care provider which countries are considered high risk.  Your parents were born in a high-risk country, and you have not been immunized against hepatitis B (hepatitis B vaccine).  You have HIV or AIDS.  You use needles to inject street drugs.  You live with someone who has hepatitis B.  You have had sex with someone who has hepatitis B.  You get hemodialysis treatment.  You take certain medicines for conditions, including cancer, organ transplantation, and autoimmune conditions. Hepatitis C  Blood testing is recommended for:  Everyone born from [birth year] through 1963.  Anyone with known risk factors for hepatitis C. Sexually transmitted infections (STIs)  You should be screened for sexually transmitted infections (STIs) including gonorrhea and chlamydia if:  You are sexually active and are younger than 66 years of age.  You are older than 66 years of age and your health care provider tells you that you are at risk for this type of infection.  Your sexual activity has changed since you were last screened and you are at an increased risk for chlamydia or gonorrhea. Ask your health care provider if you are at risk.  If you do not have HIV, but are at risk, it may be recommended that you take a prescription medicine daily to prevent HIV infection. This is called pre-exposure prophylaxis (PrEP). You are considered at risk if:  You are sexually active and do not regularly use condoms or know the HIV status of your partner(s).  You take drugs by injection.  You are sexually active with a partner who has HIV. Talk with your health care provider about whether you are at high risk of being infected with HIV. If you choose to begin PrEP, you  should first be tested for HIV. You should then be tested every 3 months for as long as you are taking PrEP.  PREGNANCY   If you are premenopausal and you may become pregnant, ask your health care provider about preconception counseling.  If you may become pregnant, take 400 to 800 micrograms (mcg) of folic acid every day.  If you want to prevent pregnancy, talk to your health care provider about birth control (contraception). OSTEOPOROSIS AND MENOPAUSE   Osteoporosis is a disease in which the bones lose minerals and strength with aging. This can result in serious bone fractures. Your risk for osteoporosis can be identified using a bone density scan.  If you are 7 years of age or older, or if you are at risk for osteoporosis and fractures, ask your health care provider if you should be screened.  Ask your health care provider whether you should take a calcium or vitamin D supplement to lower your risk for osteoporosis.  Menopause may have certain physical symptoms and risks.  Hormone replacement therapy may reduce some of these symptoms and risks. Talk to your health care provider about whether hormone replacement therapy is right for you.  HOME CARE INSTRUCTIONS   Schedule regular health, dental, and eye exams.  Stay current with your immunizations.   Do not use any tobacco products including cigarettes, chewing tobacco, or electronic cigarettes.  If you are pregnant, do not drink alcohol.  If you are breastfeeding, limit how much and how often you drink alcohol.  Limit alcohol intake to no more than 1 drink per day for nonpregnant women. One drink equals 12 ounces of beer, 5 ounces of wine, or 1 ounces of hard liquor.  Do not use street drugs.  Do not share needles.  Ask your health care provider for help if you need support or information about quitting drugs.  Tell your health care provider if you often feel depressed.  Tell your health care provider if you have ever  been abused or do not feel safe at home. Document Released: 10/20/2010 Document Revised: 08/21/2013 Document Reviewed: 03/08/2013 [facility] Patient Information 2015 [place], [place]. This information is not intended to replace advice given to you by your health care provider. Make sure you discuss any questions you have with your health care provider.

## 2014-01-05 LAB — GC/CHLAMYDIA PROBE AMP, URINE
Chlamydia, Swab/Urine, PCR: NEGATIVE
GC PROBE AMP, URINE: NEGATIVE

## 2014-01-26 ENCOUNTER — Other Ambulatory Visit: Payer: Self-pay

## 2014-01-26 MED ORDER — ROSUVASTATIN CALCIUM 40 MG PO TABS: ORAL_TABLET | ORAL | Status: DC

## 2014-01-26 NOTE — Telephone Encounter (Signed)
Refill sent for crestor.  

## 2014-01-29 ENCOUNTER — Encounter: Payer: Self-pay | Admitting: Internal Medicine

## 2014-01-30 ENCOUNTER — Encounter: Payer: Self-pay | Admitting: Family Medicine

## 2014-01-30 ENCOUNTER — Encounter: Payer: Self-pay | Admitting: *Deleted

## 2014-01-30 LAB — HM MAMMOGRAPHY: HM Mammogram: NEGATIVE

## 2014-02-16 NOTE — Telephone Encounter (Signed)
Morey Hummingbird or donna: Can you answer this question the patient is having?

## 2014-04-26 ENCOUNTER — Encounter: Payer: Self-pay | Admitting: *Deleted

## 2014-04-26 NOTE — Telephone Encounter (Signed)
From: [name] Sent: 01/29/2014 7:36 PM EDT To: [name] Subject: RE:Flu Shot Clinic Saturday, 02/03/14 [place]  I got my flu shot at [facility] on Monday, Jan 22, 2014. Thanks.  ----- Message ----- From: [name] Sent: 01/29/2014 8:44 AM EDT To: [name] Subject: Flu Shot Clinic Saturday, 02/03/14 [facility] [employer] [facility] will hold a [employer] on Saturday, October 17 from 9 am to noon. In order to plan appropriately, we ask you to please call the office to schedule an appointment time during the clinic. Our schedulers are ready to assist you, Monday through Friday, 8a to 5p at [phone]. Thank you for choosing [facility] for your healthcare needs.

## 2014-06-07 ENCOUNTER — Encounter: Payer: Self-pay | Admitting: Cardiovascular Disease

## 2014-06-07 ENCOUNTER — Ambulatory Visit (INDEPENDENT_AMBULATORY_CARE_PROVIDER_SITE_OTHER): Payer: Medicare Other | Admitting: Cardiovascular Disease

## 2014-06-07 VITALS — BP 110/60 | HR 71 | Ht 60.0 in | Wt 139.0 lb

## 2014-06-07 DIAGNOSIS — I739 Peripheral vascular disease, unspecified: Secondary | ICD-10-CM

## 2014-06-07 DIAGNOSIS — R635 Abnormal weight gain: Secondary | ICD-10-CM

## 2014-06-07 DIAGNOSIS — I10 Essential (primary) hypertension: Secondary | ICD-10-CM

## 2014-06-07 DIAGNOSIS — I25810 Atherosclerosis of coronary artery bypass graft(s) without angina pectoris: Secondary | ICD-10-CM

## 2014-06-07 DIAGNOSIS — E785 Hyperlipidemia, unspecified: Secondary | ICD-10-CM

## 2014-06-07 NOTE — Assessment & Plan Note (Signed)
We have placed in order to have her cholesterol checked. Will likely be better given recent weight loss

## 2014-06-07 NOTE — Assessment & Plan Note (Signed)
Blood pressure is well controlled on today's visit. No changes made to the medications. 

## 2014-06-07 NOTE — Assessment & Plan Note (Signed)
aorto fem bypass graft, no claudication symptoms We'll discuss repeat ultrasound on her next visit

## 2014-06-07 NOTE — Assessment & Plan Note (Signed)
Currently with no symptoms of angina. No further workup at this time. Continue current medication regimen. 

## 2014-06-07 NOTE — Assessment & Plan Note (Signed)
We have complimented her on her recent weight loss of 6 pounds. Recommended a regular aerobic program

## 2014-06-07 NOTE — Progress Notes (Signed)
Patient ID: [name], female    DOB: 1945/08/05, 69 y.o.   MRN: [id number]  HPI Comments: [name] is a pleasant 69 year old woman with a history of coronary artery disease, stent to her left circumflex and 2 stents to the LAD in March of 2011, aorto femoral bypass grafting in early October 2011,  with occlusion of her coronary stents in mid-October requiring bypass surgery at [facility] in mid to late October 2012. She presents for routine followup of her coronary artery disease.  History of thyroid surgery March 2014  long history of smoking though stopped in March of 2011  plastic surgery on her face in the past  In follow-up today, she reports that her weight is down from her prior clinic visit. She is buying products from [employer]. Also drinking less iced tea, drinking more water, less bread. She is not doing any regular exercise program She is working with a nutritionist Tolerating her medications. Denies any significant chest pain or shortness of breath with exertion  EKG on today's visit shows normal sinus rhythm with rate 71 bpm, no significant ST or T-wave changes  In followup today, she reports that weight continues to be a problem. Particularly around her middle area. She is starting to do more exercise, now has a Fitbit.  She denies any significant shortness of breath or chest pain with exertion. She is able to do aerobic exercise 30-40 minutes at a time. Recently overdid it on her exercise bike and now having severe pain below her left knee down to her foot, pins and needles described as a sharp shooting nerve. Previously was on Neurontin with improvement of her symptoms. She is requesting refill of her Neurontin prescription for symptoms   cardiac catheterization showed LIMA to the LAD, vein graft to the left circumflex, vein graft to the PDA which is occluded that supplies a very small area. Left main has 50% disease, ostial LAD has 95% in-stent restenosis, mid LAD has 100%  disease, 75% ostial circumflex, 100% mid circumflex, very small OM1, 30% RCA disease, ejection fraction 45%  Stress test shows suboptimal images due to GI activity on resting images, significant anterior wall ischemia in the mid to distal segments, reduced ejection fraction of 47%     Prior ventricular arrhythmia when her stents were placed in the Cath Lab requiring shock.    echocardiogram improved, EF  greater than 40%.   Cardiac catheter report from July 15 2009  indicate she had a vision bare metal stent 2.75 x 28 mm in the LCX, mini vision bare metal stent 2.5 x 28 and 2.5 x 15 mm stent to the LAD.   DJD in her neck and back Recent lab work showing total cholesterol 134, HDL 69, LDL 52, creatinine 0.96   No Known Allergies  Outpatient Encounter Prescriptions as of 06/07/2014  Medication Sig  . ALPRAZolam (XANAX) 0.5 MG tablet TAKE ONE TABLET AT BEDTIME  . aspirin 81 MG EC tablet Take 81 mg by mouth daily.    . Cholecalciferol (VITAMIN D3) 1000 UNITS CAPS Take 1,000 Units by mouth 1 day or 1 dose.    . Coenzyme Q10 100 MG capsule Take 100 mg by mouth daily.   Marland Kitchen HYDROcodone-acetaminophen (NORCO) 7.5-325 MG per tablet Take 1 tablet by mouth every 6 (six) hours as needed for moderate pain.  Marland Kitchen KRILL OIL PO Take 350 mg by mouth daily.  . metoprolol tartrate (LOPRESSOR) 25 MG tablet Take 0.5 tablets (12.5 mg total) by  mouth 2 (two) times daily.  . pantoprazole (PROTONIX) 40 MG tablet TAKE ONE (1) TABLET BY MOUTH EVERY DAY  . Probiotic Product (PROBIOTIC FORMULA) CAPS Take by mouth daily.  . rosuvastatin (CRESTOR) 40 MG tablet TAKE ONE TABLET BY MOUTH DAILY  . Turmeric Curcumin 500 MG CAPS Take by mouth daily.  . [DISCONTINUED] Omega-3 Fatty Acids (FISH OIL BURP-LESS) 1200 MG CAPS Take by mouth 1 dose over 46 hours.      Past Medical History  Diagnosis Date  . Coronary artery disease 2011    CABG x 3  . Hypertension   . Hyperlipidemia     Past Surgical History  Procedure  Laterality Date  . Coronary artery bypass graft  02/07/2010    x 3  . Foot surgery      right  . Cardiac catheterization  2013    [facility]  . Facial cosmetic surgery  2013    Dr. Nadeen Landau  . Parathyroidectomy      Social History  reports that she quit smoking about 4 years ago. Her smoking use included Cigarettes. She has a 40 pack-year smoking history. She has never used smokeless tobacco. She reports that she drinks about 0.5 oz of alcohol per week. She reports that she does not use illicit drugs.  Family History Family history is unknown by patient.   Review of Systems  Constitutional: Negative.   Respiratory: Negative.   Cardiovascular: Negative.   Gastrointestinal: Negative.   Musculoskeletal: Positive for back pain.       Leg pain  Skin: Negative.   Neurological: Negative.   Hematological: Negative.   Psychiatric/Behavioral: Negative.   All other systems reviewed and are negative.   BP 110/60 mmHg  Pulse 71  Ht 5' (1.524 m)  Wt 139 lb (63.05 kg)  BMI 27.15 kg/m2  Physical Exam  Constitutional: She is oriented to person, place, and time. She appears well-developed and well-nourished.  HENT:  Head: Normocephalic.  Nose: Nose normal.  Mouth/Throat: Oropharynx is clear and moist.  Eyes: Conjunctivae are normal. Pupils are equal, round, and reactive to light.  Neck: Normal range of motion. Neck supple. No JVD present.  Cardiovascular: Normal rate, regular rhythm, S1 normal, S2 normal, normal heart sounds and intact distal pulses.  Exam reveals no gallop and no friction rub.   No murmur heard. Pulmonary/Chest: Effort normal and breath sounds normal. No respiratory distress. She has no wheezes. She has no rales. She exhibits no tenderness.  Abdominal: Soft. Bowel sounds are normal. She exhibits no distension. There is no tenderness.  Musculoskeletal: Normal range of motion. She exhibits no edema or tenderness.  Lymphadenopathy:    She has no cervical adenopathy.   Neurological: She is alert and oriented to person, place, and time. Coordination normal.  Skin: Skin is warm and dry. No rash noted. No erythema.  Psychiatric: She has a normal mood and affect. Her behavior is normal. Judgment and thought content normal.    Assessment and Plan  Nursing note and vitals reviewed.

## 2014-06-07 NOTE — Patient Instructions (Signed)
You are doing well. No medication changes were made.  Please call us if you have new issues that need to be addressed before your next appt.  Your physician wants you to follow-up in: 6 months.  You will receive a reminder letter in the mail two months in advance. If you don't receive a letter, please call our office to schedule the follow-up appointment.   

## 2014-06-08 NOTE — Addendum Note (Signed)
Addended by: Minna Merritts on: 06/08/2014 01:08 PM   Modules accepted: Level of Service

## 2014-06-12 ENCOUNTER — Other Ambulatory Visit (INDEPENDENT_AMBULATORY_CARE_PROVIDER_SITE_OTHER): Payer: Medicare Other | Admitting: *Deleted

## 2014-06-12 DIAGNOSIS — I25810 Atherosclerosis of coronary artery bypass graft(s) without angina pectoris: Secondary | ICD-10-CM

## 2014-06-12 DIAGNOSIS — E785 Hyperlipidemia, unspecified: Secondary | ICD-10-CM

## 2014-06-13 ENCOUNTER — Encounter: Payer: Self-pay | Admitting: Cardiovascular Disease

## 2014-06-13 LAB — LIPID PANEL
CHOL/HDL RATIO: 2.2 ratio (ref 0.0–4.4)
CHOLESTEROL TOTAL: 149 mg/dL (ref 100–199)
HDL: 68 mg/dL (ref >39)
LDL Calculated: 67 mg/dL (ref 0–99)
Triglycerides: 70 mg/dL (ref 0–149)
VLDL Cholesterol Cal: 14 mg/dL (ref 5–40)

## 2014-06-13 LAB — HEPATIC FUNCTION PANEL
ALT: 22 IU/L (ref 0–32)
AST: 31 IU/L (ref 0–40)
Albumin: 4.6 g/dL (ref 3.6–4.8)
Alkaline Phosphatase: 60 IU/L (ref 39–117)
BILIRUBIN, DIRECT: 0.11 mg/dL (ref 0.00–0.40)
Bilirubin Total: 0.4 mg/dL (ref 0.0–1.2)
Total Protein: 6.7 g/dL (ref 6.0–8.5)

## 2014-06-25 ENCOUNTER — Other Ambulatory Visit: Payer: Self-pay | Admitting: Internal Medicine

## 2014-06-25 NOTE — Telephone Encounter (Signed)
rx faxed

## 2014-06-25 NOTE — Telephone Encounter (Signed)
Last Ov 9.17.15, last refill 2.13.16.  Please advise refill

## 2014-08-07 NOTE — Discharge Summary (Signed)
PATIENT NAME:  [name], [name] MR#:  [id number] DATE OF BIRTH:  Aug 29, 1945  DATE OF ADMISSION:  02/01/2012 DATE OF DISCHARGE:  02/03/2012  ADMITTING PHYSICIAN:  Dr. Harrel Lemon  DISCHARGING PHYSICIAN: Dr. Gladstone Lighter   PRIMARY CARE PHYSICIAN: Dr. Ronette Deter  PRIMARY CARDIOLOGIST: Dr. Ida Rogue   CONSULTATIONS IN THE HOSPITAL:  Cardiology consultation by Dr. Fletcher Anon.   DISCHARGE DIAGNOSES:  1. Chest pain, unstable angina status post cardiac catheterization this admission revealing patent stents to LAD and left circumflex but blocked stent to the right PDA, which was too narrow for any intervention.  2. Coronary artery disease, status post stents, and coronary artery bypass graft surgery in 2011 and 2012.  3. Gastroesophageal reflux disease.  4. Hypertension.  5. Hyperlipidemia.  6. Constipation.  DISCHARGE HOME MEDICATIONS:  1. Fish oil once a day as.  2. Xanax 0.5 mg at bedtime as needed for anxiety.  3. Vitamin D3 1000 international units p.o. daily.  4. Lortab 7.5/500, 1 tablet every six hours as needed.  5. Crestor 40 mg p.o. daily.  6. Aspirin 81 mg p.o. daily.  7. Protonix 40 mg p.o. daily.  8. Niaspan 500 mg p.o. daily.  9. Gabapentin 300 mg p.o. once a day.  10. Coenzyme Q-10, 100-mg capsule twice a day.  11. Lopressor 12.5 mg p.o. b.i.d.   DISCHARGE DIET: Low-sodium diet.   DISCHARGE ACTIVITY: As tolerated.     FOLLOWUP INSTRUCTIONS:  1. Primary care physician followup in 2 weeks. 2. Cardiology followup in 3 to 4 weeks.  LABS AND IMAGING STUDIES: WBC 6.6, hemoglobin 12.3, hematocrit 36, platelet count 192. Sodium 144, potassium 4.2, chloride 113, bicarbonate 25, BUN 16, creatinine 0.85, glucose 100, and calcium 9.9. INR 0.9. LexiScan Myoview was abnormal and did show significant anterior wall ischemia in mid to distal segments. Left ventricular systolic function was low normal.  It was reduced to 47%. Chest x-ray on admission showing bypass changes. No  acute cardiopulmonary disease seen. Cardiac catheterization revealed patent saphenous venous graft to the left circumflex and LIMA to LAD, occluded saphenous venous graft to right PDA but it is very small and difficult for intervention.   BRIEF HOSPITAL COURSE: [name] is a 69 year old Caucasian female with past medical history significant for coronary artery disease status post stents placed in 2011, which was followed by blockage of all the stents within six months and had bypass graft surgery at the end of 2011 and history of gastroesophageal reflux disease who was doing fine up until the day prior to admission when she went to a birthday party, had a heavy dinner, and had heartburn and also chest pain symptoms. Initially she thought it was secondary to reflux, so she took an extra Protonix and also TUMS,  which normally relieves her heartburn, but without any improvement. She came to the Emergency Room.   1. Chest pain, likely unstable angina: Initially thought to be reflux. However, because of her high risk cardiac history she was admitted, had a Myoview done which was abnormal showing ischemia of the anterior wall in the distal segments. She was seen by cardiologist Dr. Fletcher Anon and was taken for cardiac catheterization on 02/02/2012 which revealed patent saphenous vein graft to LAD and patent IMA graft to circumflex, but the saphenous vein graft to right PDA was occluded.  It was a smaller territory and very narrow, difficult for intervention, so optimal medical management was recommended. The patient was already on aspirin and also low dose metoprolol, which will  be continued at this time. She is also on Crestor, fish oil capsules, and aspirin for her hyperlipidemia.  Post catheterization the patient did not have any complications. Her groin site was without any bleeding, tenderness, or swelling. She is being discharged in stable condition.  2. Gastroesophageal reflux disease:  Advised to continue Protonix  once or twice a day as needed and TUMS p.r.n.   Her course has been otherwise uneventful in the hospital.   DISCHARGE CONDITION: Stable.   DISCHARGE DISPOSITION: Home.   TIME SPENT ON DISCHARGE: 45 minutes.   ____________________________ Gladstone Lighter, MD [id number]:bjt D: 02/03/2012 14:00:40 ET T: 02/04/2012 11:23:29 ET JOB#: [id number]  cc: Gladstone Lighter, MD, <Dictator> Minna Merritts, MD Eduard Clos Gilford Rile, MD Gladstone Lighter MD ELECTRONICALLY SIGNED 02/09/2012 14:05

## 2014-08-07 NOTE — H&P (Signed)
PATIENT NAME:  [name], [name] MR#:  [id number] DATE OF BIRTH:  11-Jul-1945  DATE OF ADMISSION:  02/01/2012  PRIMARY CARE PHYSICIAN: Dr. Ronette Deter CARDIOLOGIST: Dr. Ida Rogue  CHIEF COMPLAINT: Chest pain.   HISTORY OF PRESENT ILLNESS: This is a 69 year old female who has an extensive history of coronary artery disease, she had a myocardial infarction in early 2011, had three stents placed, they subsequently all three failed and she had bypass surgery later in 2011. She has been doing fairly well since then with no episodes of chest pain. She also has gastroesophageal reflux disease and has had heartburn from that. Her birthday two days ago, she was taken out to dinner. She said that she started having what she thought was heartburn then but it has got persistent and continued to have heartburn and pressure similar to symptoms that she had when she had her MI back in 2011. She comes in to the Emergency Room and while she is here her pain goes away. With her extensive coronary history we feel she needs observation and further evaluation.   PAST MEDICAL HISTORY:  1. Coronary artery disease.  2. Chronic back pain.  3. Gastroesophageal reflux disease.  4. Hyperlipidemia.  5. Peripheral vascular disease.  6. Osteoporosis.  7. Degenerative joint disease.   PAST SURGICAL HISTORY: Coronary artery bypass graft in 2011, left what sounds like fem-pop bypass.    ALLERGIES: No known drug allergies.   CURRENT MEDICATIONS:  1. Alprazolam 0.5 mg at bedtime p.r.n.  2. Aspirin 81 mg daily.  3. Co-Q-10 100 mg b.i.d.  4. Crestor 40 mg at bedtime.  5. Fish oil 1000 mg daily.  6. Gabapentin 300 mg daily.  7. Lopressor 25 mg b.i.d.  8. Lortab 7.5/500 mg every six hours p.r.n.  9. Niaspan ER 500 mg daily.  10. Protonix 40 mg daily.  11. Vitamin D3 1000 units daily   SOCIAL HISTORY: Does not smoke. Does not drink alcohol.   FAMILY HISTORY: Significant for father and grandfather who both had  coronary artery disease.    REVIEW OF SYSTEMS: CONSTITUTIONAL: No fever or chills. EYES: No blurred vision. ENT: No hearing loss. CARDIOVASCULAR: Some chest pain. PULMONARY: No shortness of breath. GASTROINTESTINAL: No nausea, vomiting, or diarrhea. GENITOURINARY: No dysuria. ENDOCRINE: No heat or cold intolerance. INTEGUMENT: No rash. MUSCULOSKELETAL: Occasional joint pain. She has back pain. NEUROLOGIC: No numbness or weakness.   PHYSICAL EXAMINATION:  VITAL SIGNS: Temperature 98.4, pulse 62, respiration 18, blood pressure 143/64.   GENERAL: This is a well-nourished white female in no acute distress.   HEENT: The pupils are equal, round, reactive to light. The sclerae is not icteric. The oral mucosa is moist. Oropharynx is clear. Nasopharynx is clear.   NECK: Supple. No JVD, lymphadenopathy. No thyromegaly.   CARDIOVASCULAR: Regular rate and rhythm. No murmurs, rubs, or gallops.   LUNGS: Clear to auscultation. No dullness to percussion. She is not using accessory muscles.   ABDOMEN: Soft, nontender, nondistended. Bowel sounds are positive. No hepatosplenomegaly. No masses.   EXTREMITIES: There is no edema. No joint deformity.   NEUROLOGIC: Cranial nerves II through XII are intact. She is alert and oriented x4.   SKIN: Moist with no rash.   LABORATORY, DIAGNOSTIC AND RADIOLOGICAL DATA: EKG shows normal sinus rhythm. White blood cells 9.7, hemoglobin 13.4, BUN 21, creatinine 0.97, troponin less than 0.02.   ASSESSMENT AND PLAN:  1. Chest pain. The patient does have an extensive coronary. With his history feel like we need  to go ahead and further work her up. Will check another set of cardiac enzymes. Consult cardiology. Consider doing a functional study since she has not had one done since her bypass grafting to assess blood flow. Will continue her aspirin, beta blocker that she is on.  2. Coronary artery disease. Will continue current medications at this point.  3. Hyperlipidemia. She  is on a statin and on Niaspan, will continue this.  4. Gastroesophageal reflux disease. Will continue her proton pump inhibitors. She did have symptoms of reflux, however, she had those same type symptoms when she had her first myocardial infarction so at this point we cannot rule out coronary causes of her chest pain but if those are ruled out then we might need to reassess her reflux disease.   TIME SPENT ON ADMISSION: 45 minutes.   ____________________________ Baxter Hire, MD jdj:cms D: 02/01/2012 04:07:23 ET T: 02/01/2012 08:34:06 ET JOB#: [id number]  cc: Baxter Hire, MD, <Dictator> Eduard Clos. Gilford Rile, MD Minna Merritts, MD Baxter Hire MD ELECTRONICALLY SIGNED 02/01/2012 16:19

## 2014-08-07 NOTE — Consult Note (Signed)
General Aspect PCP: Ronette Deter.  Cardiologist: Dr. Rockey Situ.   [name] is a pleasant 69 year old woman with a history of coronary artery disease, stent to her left circumflex and 2 stents to the LAD in March of 2011, aorto femoral bypass grafting in early October 2011,  with occlusion of her coronary stents in mid-October requiring bypass surgery at [facility] in mid to late October 2012.  long history of smoking though stopped in March of 2011.  ventricular arrhythmia when her stents were placed in the Cath Lab requiring shock.     echocardiogram improved, EF  greater than 40%. the patient had a recent birthday. She over ate. Over the weekend, she had symptoms of substernal burning sensation with mild tightness feeling. This is similar to her history of GERD but she also mentions that during her prior myocardial infarctions, she had similar symptoms. The symptoms happen at rest and did not worsen with physical activities. She's currently chest pain-free. Cardiac enzymes are negative.   Physical Exam:   GEN no acute distress    NECK supple  No masses    RESP normal resp effort  clear BS    CARD Regular rate and rhythm  No murmur    ABD denies tenderness    EXTR negative cyanosis/clubbing, negative edema    SKIN normal to palpation    PSYCH alert, A+O to time, place, person   Review of Systems:   Subjective/Chief Complaint chest pain.    General: No Complaints    Skin: No Complaints    ENT: No Complaints    Eyes: No Complaints    Neck: No Complaints    Respiratory: No Complaints    Cardiovascular: Tightness    Gastrointestinal: No Complaints    Genitourinary: No Complaints    Vascular: No Complaints    Musculoskeletal: No Complaints    Neurologic: No Complaints    Hematologic: No Complaints    Endocrine: No Complaints    Psychiatric: No Complaints    Review of Systems: All other systems were reviewed and found to be negative   Home Medications: Medication  Instructions Status  alprazolam 0.5 mg oral tablet tab(s) orally once a day (at bedtime), As Needed- for Anxiety, Nervousness  Active  Vitamin D3 1000 intl units oral capsule 1 cap(s) orally once a day Active  Lortab 7.5/500 oral tablet 1 tab(s) orally every 6 hours, As Needed- for Pain  Active  Lopressor 25 milligram(s) orally 2 times a day Active  Crestor 40 mg oral tablet 1 tab(s) orally once a day (at bedtime) Active  ASA 81 milligram(s) orally once a day Active  Protonix 40 mg oral delayed release tablet 1 tab(s) orally once a day Active  Niaspan ER 500 mg oral tablet, extended release 1 tab(s) orally once a day (at bedtime) Active  gabapentin 300 mg oral capsule 1 cap(s) orally once a day Active  Coenzyme Q10 100 mg oral capsule 1 cap(s) orally 2 times a day Active  Fish Oil  Active   Lab Results: Cardiology:  14-Oct-13 09:18    Protocol LEXISCAN   Max Work Load 10   Total Exercise Time 60   Max Diastolic BP 74   Max Heart Rate 98   Max Predicted Heart Rate 156   ECG interpretation Confirmed by OVERREAD, NOT ([id number]), editor PEARSON, BARBARA ([id number]) on 02/01/2012 9:56:32 AM  Cardiac:  14-Oct-13 03:48    Troponin I < 0.02 (0.00-0.05 0.05 ng/mL or less: NEGATIVE  Repeat testing in  3-6 hrs  if clinically indicated. >0.05 ng/mL: POTENTIAL  MYOCARDIAL INJURY. Repeat  testing in 3-6 hrs if  clinically indicated. NOTE: An increase or decrease  of 30% or more on serial  testing suggests a  clinically important change)    12:25    Troponin I < 0.02 (0.00-0.05 0.05 ng/mL or less: NEGATIVE  Repeat testing in 3-6 hrs  if clinically indicated. >0.05 ng/mL: POTENTIAL  MYOCARDIAL INJURY. Repeat  testing in 3-6 hrs if  clinically indicated. NOTE: An increase or decrease  of 30% or more on serial  testing suggests a  clinically important change)   EKG:   EKG Nml  NSR    No Known Allergies:   Vital Signs/Nurse's Notes: **Vital Signs.:   14-Oct-13 11:32   Vital Signs Type Q  4hr   Temperature Temperature (F) 97.9   Celsius 36.6   Temperature Source Oral   Pulse Pulse 71   Respirations Respirations 20   Systolic BP Systolic BP 590   Diastolic BP (mmHg) Diastolic BP (mmHg) 65   Mean BP 81   Pulse Ox % Pulse Ox % 95   Pulse Ox Activity Level  At rest   Oxygen Delivery Room Air/ 21 %     Impression 1. Atypical chest pain likely due to GERD. 2. Known history of coronary artery disease status post PCI as well as CABG in 2012. No cardiac events since then.    Plan her current symptoms are overall atypical and likely due to GERD. Less likely to be due to underlying cardiac ischemia. Her symptoms have been happening at rest and not with activities. Baseline ECG is unremarkable and cardiac enzymes are negative so far. She underwent a pharmacologic nuclear stress test this morning. If the test shows no significant ischemia, the patient can be discharged home from a cardiac standpoint. She is to continue aggressive medical therapy.   Electronic Signatures: Kathlyn Sacramento (MD)  (Signed 14-Oct-13 13:19)  Authored: General Aspect/Present Illness, History and Physical Exam, Review of System, Home Medications, Labs, EKG , Allergies, Vital Signs/Nurse's Notes, Impression/Plan   Last Updated: 14-Oct-13 13:19 by Kathlyn Sacramento (MD)

## 2014-08-10 NOTE — Op Note (Signed)
PATIENT NAME:  [name], HALBERT MR#:  [id number] DATE OF BIRTH:  [birth year]  DATE OF PROCEDURE:  07/07/2012  PREOPERATIVE DIAGNOSIS: Right parathyroid adenoma.   POSTOPERATIVE DIAGNOSIS: Right superior parathyroid adenoma.   PROCEDURE: Right parathyroidectomy.   SURGEON: Janalee Dane, M.D.  ASSISTANT: Malon Kindle, MD.   DESCRIPTION OF PROCEDURE: The patient was placed in the supine position on the operating room table. After general endotracheal anesthesia had been induced, the patient was placed on a shoulder roll with neck extended position. A previous scar was chosen from the cervicomental angle and Z-plasty was created there to allow for additional exposure inferiorly. Dissection was carried down to the strap muscles, which were reflected laterally from the superficial aspect of the thyroid lobe on the right. The thyroid lobe was reflected more medially to expose the recurrent laryngeal nerve, which was identified in the standard location. It was stimulated for confirmation and with the laryngeal monitor endotracheal tube confirmation was indeed received taking care to preserve the recurrent laryngeal nerve. A small nodule was taken from inferiorly in the tracheoesophageal groove that turned out to be thyroid nodule tissue more superiorly above the entrance of the recurrent laryngeal nerve. In the posterior aspect of the larynx, the parathyroid adenoma was encountered. This was skeletonized. It approximately 2 x 1.2 cm. This was sent for frozen section and confirmed to be hypercellular parathyroid tissue. A small amount of Surgicel was placed. The wound was then closed over a 7 mm Jackson-Pratt drain. The patient was returned to anesthesia, allowed to emerge from anesthesia in the operating room, and taken to the recovery room in stable condition. There were no complications. Estimated blood loss 15 mL.   ____________________________ J. Nadeen Landau, MD jmc:aw D: 07/07/2012 11:25:13  ET T: 07/07/2012 11:38:50 ET JOB#: [id number]  cc: Janalee Dane, MD, <Dictator> Nicholos Johns MD ELECTRONICALLY SIGNED 07/11/2012 7:49

## 2014-08-29 ENCOUNTER — Other Ambulatory Visit: Payer: Self-pay | Admitting: Cardiovascular Disease

## 2014-10-05 ENCOUNTER — Encounter (INDEPENDENT_AMBULATORY_CARE_PROVIDER_SITE_OTHER): Payer: Medicare Other | Admitting: Ophthalmology

## 2014-10-05 DIAGNOSIS — H35033 Hypertensive retinopathy, bilateral: Secondary | ICD-10-CM | POA: Diagnosis not present

## 2014-10-05 DIAGNOSIS — H43813 Vitreous degeneration, bilateral: Secondary | ICD-10-CM | POA: Diagnosis not present

## 2014-10-05 DIAGNOSIS — I10 Essential (primary) hypertension: Secondary | ICD-10-CM

## 2014-10-05 DIAGNOSIS — H3532 Exudative age-related macular degeneration: Secondary | ICD-10-CM | POA: Diagnosis not present

## 2014-10-05 DIAGNOSIS — H3531 Nonexudative age-related macular degeneration: Secondary | ICD-10-CM | POA: Diagnosis not present

## 2014-10-05 DIAGNOSIS — H2513 Age-related nuclear cataract, bilateral: Secondary | ICD-10-CM

## 2014-10-23 ENCOUNTER — Other Ambulatory Visit: Payer: Self-pay | Admitting: Internal Medicine

## 2014-10-23 NOTE — Telephone Encounter (Signed)
Last visit 01/04/14,no appt scheduled. Sent mychart message on need for appt. Refill?

## 2014-10-24 NOTE — Telephone Encounter (Signed)
Pt notified via mychart msg regarding an appt. Faxed to pharmacy

## 2014-10-24 NOTE — Telephone Encounter (Signed)
Refill for 30 days only.  Will needfollow up  With Dr Gilford Rile for  any more refills

## 2014-11-01 ENCOUNTER — Encounter (INDEPENDENT_AMBULATORY_CARE_PROVIDER_SITE_OTHER): Payer: Medicare Other | Admitting: Ophthalmology

## 2014-11-01 DIAGNOSIS — H43813 Vitreous degeneration, bilateral: Secondary | ICD-10-CM

## 2014-11-01 DIAGNOSIS — H3531 Nonexudative age-related macular degeneration: Secondary | ICD-10-CM | POA: Diagnosis not present

## 2014-11-01 DIAGNOSIS — I10 Essential (primary) hypertension: Secondary | ICD-10-CM

## 2014-11-01 DIAGNOSIS — H35033 Hypertensive retinopathy, bilateral: Secondary | ICD-10-CM

## 2014-11-01 DIAGNOSIS — H3532 Exudative age-related macular degeneration: Secondary | ICD-10-CM

## 2014-11-27 ENCOUNTER — Other Ambulatory Visit: Payer: Self-pay | Admitting: Cardiovascular Disease

## 2014-11-27 ENCOUNTER — Other Ambulatory Visit: Payer: Self-pay | Admitting: Internal Medicine

## 2014-11-27 NOTE — Telephone Encounter (Signed)
Last OV 9.17.15.  Please advise refill

## 2014-11-27 NOTE — Telephone Encounter (Signed)
rx faxed

## 2014-11-29 ENCOUNTER — Encounter: Payer: Self-pay | Admitting: Internal Medicine

## 2014-11-29 ENCOUNTER — Encounter (INDEPENDENT_AMBULATORY_CARE_PROVIDER_SITE_OTHER): Payer: Medicare Other | Admitting: Ophthalmology

## 2014-11-29 DIAGNOSIS — H3532 Exudative age-related macular degeneration: Secondary | ICD-10-CM

## 2014-11-29 DIAGNOSIS — H43813 Vitreous degeneration, bilateral: Secondary | ICD-10-CM

## 2014-11-29 DIAGNOSIS — H35033 Hypertensive retinopathy, bilateral: Secondary | ICD-10-CM

## 2014-11-29 DIAGNOSIS — I10 Essential (primary) hypertension: Secondary | ICD-10-CM

## 2014-11-29 DIAGNOSIS — H3531 Nonexudative age-related macular degeneration: Secondary | ICD-10-CM | POA: Diagnosis not present

## 2014-12-14 ENCOUNTER — Encounter: Payer: Self-pay | Admitting: Internal Medicine

## 2014-12-19 ENCOUNTER — Encounter: Payer: Self-pay | Admitting: Internal Medicine

## 2014-12-25 ENCOUNTER — Ambulatory Visit (INDEPENDENT_AMBULATORY_CARE_PROVIDER_SITE_OTHER): Payer: Medicare Other | Admitting: Internal Medicine

## 2014-12-25 ENCOUNTER — Encounter: Payer: Self-pay | Admitting: Internal Medicine

## 2014-12-25 ENCOUNTER — Other Ambulatory Visit: Payer: Self-pay | Admitting: Cardiovascular Disease

## 2014-12-25 VITALS — BP 111/70 | HR 67 | Temp 98.0°F | Ht 60.5 in | Wt 145.4 lb

## 2014-12-25 DIAGNOSIS — H3530 Unspecified macular degeneration: Secondary | ICD-10-CM

## 2014-12-25 DIAGNOSIS — D1720 Benign lipomatous neoplasm of skin and subcutaneous tissue of unspecified limb: Secondary | ICD-10-CM | POA: Diagnosis not present

## 2014-12-25 NOTE — Patient Instructions (Signed)
Lipoma  A lipoma is a noncancerous (benign) tumor composed of fat cells. They are usually found under the skin (subcutaneous). A lipoma may occur in any tissue of the body that contains fat. Common areas for lipomas to appear include the back, shoulders, buttocks, and thighs. Lipomas are a very common soft tissue growth. They are soft and grow slowly. Most problems caused by a lipoma depend on where it is growing.  DIAGNOSIS   A lipoma can be diagnosed with a physical exam. These tumors rarely become cancerous, but radiographic studies can help determine this for certain. Studies used may include:  · Computerized X-ray scans (CT or CAT scan).  · Computerized magnetic scans (MRI).  TREATMENT   Small lipomas that are not causing problems may be watched. If a lipoma continues to enlarge or causes problems, removal is often the best treatment. Lipomas can also be removed to improve appearance. Surgery is done to remove the fatty cells and the surrounding capsule. Most often, this is done with medicine that numbs the area (local anesthetic). The removed tissue is examined under a microscope to make sure it is not cancerous. Keep all follow-up appointments with your caregiver.  SEEK MEDICAL CARE IF:   · The lipoma becomes larger or hard.  · The lipoma becomes painful, red, or increasingly swollen. These could be signs of infection or a more serious condition.  Document Released: 03/27/2002 Document Revised: 06/29/2011 Document Reviewed: 09/06/2009  [employer]® Patient Information ©2015 [employer]. This information is not intended to replace advice given to you by your health care provider. Make sure you discuss any questions you have with your health care provider.

## 2014-12-25 NOTE — Assessment & Plan Note (Signed)
Recently diagnosed with macular degeneration. Started on Avastin injections. Vision has improved. Will request notes from Dr. Zigmund Daniel.

## 2014-12-25 NOTE — Assessment & Plan Note (Signed)
Nodular area left upper arm most c/w lipoma. Discussed imaging with [place], but she prefers to hold off for now. We will re-examine at her visit in 2 weeks. She will call sooner if any concerns.

## 2014-12-25 NOTE — Progress Notes (Signed)
Subjective:    Patient ID: [name], female    DOB: [birth year], 69 y.o.   MRN: [id number]  HPI  69YO female presents for acute visit.  Cyst - noted left upper arm a few weeks ago. Not painful. No trauma. No tenderness at site. No trauma to site.Seems to be getting smaller.  Recently diagnosed with macular degeneration. Followed by Dr. Zigmund Daniel. Started on Avastin injections. Initially had blurred vision in left eye, now this is improved.  Past Medical History  Diagnosis Date  . Coronary artery disease 2011    CABG x 3  . Hypertension   . Hyperlipidemia   . Macular degeneration    Family History  Problem Relation Age of Onset  . Family history unknown: Yes   Past Surgical History  Procedure Laterality Date  . Coronary artery bypass graft  02/07/2010    x 3  . Foot surgery      right  . Cardiac catheterization  2013    [facility]  . Facial cosmetic surgery  2013    Dr. Nadeen Landau  . Parathyroidectomy     Social History   Social History  . Marital Status: Single    Spouse Name: N/A  . Number of Children: N/A  . Years of Education: N/A   Social History Main Topics  . Smoking status: Former Smoker -- 1.00 packs/day for 40 years    Types: Cigarettes    Quit date: 07/15/2009  . Smokeless tobacco: Never Used  . Alcohol Use: 0.5 oz/week    1 Standard drinks or equivalent per week  . Drug Use: No  . Sexual Activity: Not Asked   Other Topics Concern  . None   Social History Narrative    Review of Systems  Constitutional: Negative for fever, chills, appetite change, fatigue and unexpected weight change.  Eyes: Positive for visual disturbance.  Respiratory: Negative for shortness of breath.   Cardiovascular: Negative for chest pain and leg swelling.  Gastrointestinal: Negative for abdominal pain.  Musculoskeletal: Negative for myalgias and arthralgias.  Skin: Negative for color change and rash.  Neurological: Negative for weakness and light-headedness.    Hematological: Negative for adenopathy. Does not bruise/bleed easily.  Psychiatric/Behavioral: Negative for sleep disturbance and dysphoric mood. The patient is not nervous/anxious.        Objective:    BP 111/70 mmHg  Pulse 67  Temp(Src) 98 F (36.7 C) (Oral)  Ht 5' 0.5" (1.537 m)  Wt 145 lb 6 oz (65.942 kg)  BMI 27.91 kg/m2  SpO2 95% Physical Exam  Constitutional: She is oriented to person, place, and time. She appears well-developed and well-nourished. No distress.  HENT:  Head: Normocephalic and atraumatic.  Right Ear: External ear normal.  Left Ear: External ear normal.  Nose: Nose normal.  Mouth/Throat: Oropharynx is clear and moist.  Eyes: Conjunctivae are normal. Pupils are equal, round, and reactive to light. Right eye exhibits no discharge. Left eye exhibits no discharge. No scleral icterus.  Neck: Normal range of motion. Neck supple. No tracheal deviation present. No thyromegaly present.  Cardiovascular: Normal rate, regular rhythm, normal heart sounds and intact distal pulses.  Exam reveals no gallop and no friction rub.   No murmur heard. Pulmonary/Chest: Effort normal and breath sounds normal. No respiratory distress. She has no wheezes. She has no rales. She exhibits no tenderness.  Musculoskeletal: Normal range of motion. She exhibits no edema or tenderness.       Arms: Lymphadenopathy:    She  has no cervical adenopathy.  Neurological: She is alert and oriented to person, place, and time. No cranial nerve deficit. She exhibits normal muscle tone. Coordination normal.  Skin: Skin is warm and dry. No rash noted. She is not diaphoretic. No erythema. No pallor.  Psychiatric: She has a normal mood and affect. Her behavior is normal. Judgment and thought content normal.          Assessment & Plan:   Problem List Items Addressed This Visit      Unprioritized   Lipoma of arm - Primary    Nodular area left upper arm most c/w lipoma. Discussed imaging with [place],  but she prefers to hold off for now. We will re-examine at her visit in 2 weeks. She will call sooner if any concerns.      Macular degeneration    Recently diagnosed with macular degeneration. Started on Avastin injections. Vision has improved. Will request notes from Dr. Zigmund Daniel.          Return if symptoms worsen or fail to improve.

## 2014-12-26 ENCOUNTER — Telehealth: Payer: Self-pay | Admitting: Internal Medicine

## 2014-12-26 NOTE — Telephone Encounter (Signed)
Pt would like to get fasting labs before CPE. Pt goes to [facility] and will pick up lab sheet or can it be mailed? Thank You!

## 2014-12-26 NOTE — Telephone Encounter (Signed)
Lab order mailed to pt.

## 2014-12-26 NOTE — Telephone Encounter (Signed)
Please advise labs ?

## 2014-12-26 NOTE — Telephone Encounter (Signed)
Lab order placed on your desk

## 2014-12-27 ENCOUNTER — Encounter (INDEPENDENT_AMBULATORY_CARE_PROVIDER_SITE_OTHER): Payer: Medicare Other | Admitting: Ophthalmology

## 2014-12-27 ENCOUNTER — Ambulatory Visit: Payer: Medicare Other | Admitting: Cardiovascular Disease

## 2014-12-27 DIAGNOSIS — I10 Essential (primary) hypertension: Secondary | ICD-10-CM | POA: Diagnosis not present

## 2014-12-27 DIAGNOSIS — H3532 Exudative age-related macular degeneration: Secondary | ICD-10-CM

## 2014-12-27 DIAGNOSIS — H35033 Hypertensive retinopathy, bilateral: Secondary | ICD-10-CM

## 2014-12-27 DIAGNOSIS — H3531 Nonexudative age-related macular degeneration: Secondary | ICD-10-CM | POA: Diagnosis not present

## 2014-12-27 DIAGNOSIS — H43813 Vitreous degeneration, bilateral: Secondary | ICD-10-CM

## 2015-01-02 ENCOUNTER — Encounter: Payer: Self-pay | Admitting: Internal Medicine

## 2015-01-03 ENCOUNTER — Encounter: Payer: Self-pay | Admitting: Internal Medicine

## 2015-01-08 ENCOUNTER — Encounter: Payer: Medicare Other | Admitting: Internal Medicine

## 2015-01-17 ENCOUNTER — Encounter: Payer: Self-pay | Admitting: Internal Medicine

## 2015-01-17 ENCOUNTER — Ambulatory Visit (INDEPENDENT_AMBULATORY_CARE_PROVIDER_SITE_OTHER): Payer: Medicare Other | Admitting: Internal Medicine

## 2015-01-17 VITALS — BP 120/76 | HR 77 | Temp 97.5°F | Ht 60.0 in | Wt 146.0 lb

## 2015-01-17 DIAGNOSIS — Z0000 Encounter for general adult medical examination without abnormal findings: Secondary | ICD-10-CM | POA: Diagnosis not present

## 2015-01-17 DIAGNOSIS — Z0001 Encounter for general adult medical examination with abnormal findings: Secondary | ICD-10-CM | POA: Insufficient documentation

## 2015-01-17 MED ORDER — ALPRAZOLAM 0.5 MG PO TABS: 0.5000 mg | ORAL_TABLET | Freq: Every day | ORAL | Status: DC

## 2015-01-17 NOTE — Patient Instructions (Signed)
Health Maintenance Adopting a healthy lifestyle and getting preventive care can go a long way to promote health and wellness. Talk with your health care [name] about what schedule of regular examinations is right for you. This is a good chance for you to check in with your [name] about disease prevention and staying healthy. In between checkups, there are plenty of things you can do on your own. Experts have done a lot of research about which lifestyle changes and preventive measures are most likely to keep you healthy. Ask your health care Brynne Doane for more information. WEIGHT AND DIET  Eat a healthy diet  Be sure to include plenty of vegetables, fruits, low-fat dairy products, and lean protein.  Do not eat a lot of foods high in solid fats, added sugars, or salt.  Get regular exercise. This is one of the most important things you can do for your health.  Most adults should exercise for at least 150 minutes each week. The exercise should increase your heart rate and make you sweat (moderate-intensity exercise).  Most adults should also do strengthening exercises at least twice a week. This is in addition to the moderate-intensity exercise.  Maintain a healthy weight  Body mass index (BMI) is a measurement that can be used to identify possible weight problems. It estimates body fat based on height and weight. Your health care [name] can help determine your BMI and help you achieve or maintain a healthy weight.  For females 25 years of age and older:   A BMI below 18.5 is considered underweight.  A BMI of 18.5 to 24.9 is normal.  A BMI of 25 to 29.9 is considered overweight.  A BMI of 30 and above is considered obese.  Watch levels of cholesterol and blood lipids  You should start having your blood tested for lipids and cholesterol at 67 years of age, then have this test every 5 years.  You may need to have your cholesterol levels checked more often if:  Your lipid or  cholesterol levels are high.  You are older than 67 years of age.  You are at high risk for heart disease.  CANCER SCREENING   Lung Cancer  Lung cancer screening is recommended for adults 97-92 years old who are at high risk for lung cancer because of a history of smoking.  A yearly low-dose CT scan of the lungs is recommended for people who:  Currently smoke.  Have quit within the past 15 years.  Have at least a 30-pack-year history of smoking. A pack year is smoking an average of one pack of cigarettes a day for 1 year.  Yearly screening should continue until it has been 15 years since you quit.  Yearly screening should stop if you develop a health problem that would prevent you from having lung cancer treatment.  Breast Cancer  Practice breast self-awareness. This means understanding how your breasts normally appear and feel.  It also means doing regular breast self-exams. Let your health care [name] know about any changes, no matter how small.  If you are in your 20s or 30s, you should have a clinical breast exam (CBE) by a health care [name] every 1-3 years as part of a regular health exam.  If you are 76 or older, have a CBE every year. Also consider having a breast X-ray (mammogram) every year.  If you have a family history of breast cancer, talk to your health care [name] about genetic screening.  If you are  at high risk for breast cancer, talk to your health care [name] about having an MRI and a mammogram every year.  Breast cancer gene (BRCA) assessment is recommended for women who have family members with BRCA-related cancers. BRCA-related cancers include:  Breast.  Ovarian.  Tubal.  Peritoneal cancers.  Results of the assessment will determine the need for genetic counseling and BRCA1 and BRCA2 testing. Cervical Cancer Routine pelvic examinations to screen for cervical cancer are no longer recommended for nonpregnant women who are considered low  risk for cancer of the pelvic organs (ovaries, uterus, and vagina) and who do not have symptoms. A pelvic examination may be necessary if you have symptoms including those associated with pelvic infections. Ask your health care [name] if a screening pelvic exam is right for you.   The Pap test is the screening test for cervical cancer for women who are considered at risk.  If you had a hysterectomy for a problem that was not cancer or a condition that could lead to cancer, then you no longer need Pap tests.  If you are older than 65 years, and you have had normal Pap tests for the past 10 years, you no longer need to have Pap tests.  If you have had past treatment for cervical cancer or a condition that could lead to cancer, you need Pap tests and screening for cancer for at least 20 years after your treatment.  If you no longer get a Pap test, assess your risk factors if they change (such as having a new sexual partner). This can affect whether you should start being screened again.  Some women have medical problems that increase their chance of getting cervical cancer. If this is the case for you, your health care [name] may recommend more frequent screening and Pap tests.  The human papillomavirus (HPV) test is another test that may be used for cervical cancer screening. The HPV test looks for the virus that can cause cell changes in the cervix. The cells collected during the Pap test can be tested for HPV.  The HPV test can be used to screen women 30 years of age and older. Getting tested for HPV can extend the interval between normal Pap tests from three to five years.  An HPV test also should be used to screen women of any age who have unclear Pap test results.  After 67 years of age, women should have HPV testing as often as Pap tests.  Colorectal Cancer  This type of cancer can be detected and often prevented.  Routine colorectal cancer screening usually begins at 67 years of  age and continues through 67 years of age.  Your health care [name] may recommend screening at an earlier age if you have risk factors for colon cancer.  Your health care [name] may also recommend using home test kits to check for hidden blood in the stool.  A small camera at the end of a tube can be used to examine your colon directly (sigmoidoscopy or colonoscopy). This is done to check for the earliest forms of colorectal cancer.  Routine screening usually begins at age 50.  Direct examination of the colon should be repeated every 5-10 years through 67 years of age. However, you may need to be screened more often if early forms of precancerous polyps or small growths are found. Skin Cancer  Check your skin from head to toe regularly.  Tell your health care [name] about any new moles or changes in   moles, especially if there is a change in a mole's shape or color.  Also tell your health care [name] if you have a mole that is larger than the size of a pencil eraser.  Always use sunscreen. Apply sunscreen liberally and repeatedly throughout the day.  Protect yourself by wearing long sleeves, pants, a wide-brimmed hat, and sunglasses whenever you are outside. HEART DISEASE, DIABETES, AND HIGH BLOOD PRESSURE   Have your blood pressure checked at least every 1-2 years. High blood pressure causes heart disease and increases the risk of stroke.  If you are between 75 years and 42 years old, ask your health care [name] if you should take aspirin to prevent strokes.  Have regular diabetes screenings. This involves taking a blood sample to check your fasting blood sugar level.  If you are at a normal weight and have a low risk for diabetes, have this test once every three years after 67 years of age.  If you are overweight and have a high risk for diabetes, consider being tested at a younger age or more often. PREVENTING INFECTION  Hepatitis B  If you have a higher risk for  hepatitis B, you should be screened for this virus. You are considered at high risk for hepatitis B if:  You were born in a country where hepatitis B is common. Ask your health care [name] which countries are considered high risk.  Your parents were born in a high-risk country, and you have not been immunized against hepatitis B (hepatitis B vaccine).  You have HIV or AIDS.  You use needles to inject street drugs.  You live with someone who has hepatitis B.  You have had sex with someone who has hepatitis B.  You get hemodialysis treatment.  You take certain medicines for conditions, including cancer, organ transplantation, and autoimmune conditions. Hepatitis C  Blood testing is recommended for:  Everyone born from [birth year] through 1963.  Anyone with known risk factors for hepatitis C. Sexually transmitted infections (STIs)  You should be screened for sexually transmitted infections (STIs) including gonorrhea and chlamydia if:  You are sexually active and are younger than 67 years of age.  You are older than 67 years of age and your health care Lamine Laton tells you that you are at risk for this type of infection.  Your sexual activity has changed since you were last screened and you are at an increased risk for chlamydia or gonorrhea. Ask your health care Elijah Michaelis if you are at risk.  If you do not have HIV, but are at risk, it may be recommended that you take a prescription medicine daily to prevent HIV infection. This is called pre-exposure prophylaxis (PrEP). You are considered at risk if:  You are sexually active and do not regularly use condoms or know the HIV status of your partner(s).  You take drugs by injection.  You are sexually active with a partner who has HIV. Talk with your health care Ilayda Toda about whether you are at high risk of being infected with HIV. If you choose to begin PrEP, you should first be tested for HIV. You should then be tested every 3 months for  as long as you are taking PrEP.  PREGNANCY   If you are premenopausal and you may become pregnant, ask your health care Jaela Yepez about preconception counseling.  If you may become pregnant, take 400 to 800 micrograms (mcg) of folic acid every day.  If you want to prevent pregnancy, talk to your  health care Keerthana Vanrossum about birth control (contraception). OSTEOPOROSIS AND MENOPAUSE   Osteoporosis is a disease in which the bones lose minerals and strength with aging. This can result in serious bone fractures. Your risk for osteoporosis can be identified using a bone density scan.  If you are 43 years of age or older, or if you are at risk for osteoporosis and fractures, ask your health care Shaleah Nissley if you should be screened.  Ask your health care Canyon Lohr whether you should take a calcium or vitamin D supplement to lower your risk for osteoporosis.  Menopause may have certain physical symptoms and risks.  Hormone replacement therapy may reduce some of these symptoms and risks. Talk to your health care Yamili Lichtenwalner about whether hormone replacement therapy is right for you.  HOME CARE INSTRUCTIONS   Schedule regular health, dental, and eye exams.  Stay current with your immunizations.   Do not use any tobacco products including cigarettes, chewing tobacco, or electronic cigarettes.  If you are pregnant, do not drink alcohol.  If you are breastfeeding, limit how much and how often you drink alcohol.  Limit alcohol intake to no more than 1 drink per day for nonpregnant women. One drink equals 12 ounces of beer, 5 ounces of wine, or 1 ounces of hard liquor.  Do not use street drugs.  Do not share needles.  Ask your health care Jamelle Goldston for help if you need support or information about quitting drugs.  Tell your health care Jasilyn Holderman if you often feel depressed.  Tell your health care Quinesha Selinger if you have ever been abused or do not feel safe at home. Document Released: 10/20/2010  Document Revised: 08/21/2013 Document Reviewed: 03/08/2013 [facility] Patient Information 2015 [place], [place]. This information is not intended to replace advice given to you by your health care Proctor Carriker. Make sure you discuss any questions you have with your health care Terriana Barreras.

## 2015-01-17 NOTE — Assessment & Plan Note (Signed)
General medical exam normal today including breast exam. PAP and pelvic deferred per pt preference. Last PAP 2013, normal HPV neg. Plan repeat PAP in 2017. Mammogram ordered. Immunizations UTD. Labs reviewed with pt. Encouraged healthy diet and exercise.

## 2015-01-17 NOTE — Progress Notes (Signed)
Pre visit review using our clinic review tool, if applicable. No additional management support is needed unless otherwise documented below in the visit note. 

## 2015-01-17 NOTE — Progress Notes (Signed)
Subjective:    Patient ID: [name], female    DOB: 24-Mar-1946, 69 y.o.   MRN: [id number]  HPI  69YO female presents for physical exam.  Feeling well. No concerns today.  Wt Readings from Last 3 Encounters:  01/17/15 146 lb (66.225 kg)  12/25/14 145 lb 6 oz (65.942 kg)  06/07/14 139 lb (63.05 kg)   BP Readings from Last 3 Encounters:  01/17/15 120/76  12/25/14 111/70  06/07/14 110/60    Past Medical History  Diagnosis Date  . Coronary artery disease 2011    CABG x 3  . Hypertension   . Hyperlipidemia   . Macular degeneration    Family History  Problem Relation Age of Onset  . Family history unknown: Yes   Past Surgical History  Procedure Laterality Date  . Coronary artery bypass graft  02/07/2010    x 3  . Foot surgery      right  . Cardiac catheterization  2013    [facility]  . Facial cosmetic surgery  2013    Dr. Nadeen Landau  . Parathyroidectomy     Social History   Social History  . Marital Status: Single    Spouse Name: N/A  . Number of Children: N/A  . Years of Education: N/A   Social History Main Topics  . Smoking status: Former Smoker -- 1.00 packs/day for 40 years    Types: Cigarettes    Quit date: 07/15/2009  . Smokeless tobacco: Never Used  . Alcohol Use: 0.5 oz/week    1 Standard drinks or equivalent per week  . Drug Use: No  . Sexual Activity: Not Asked   Other Topics Concern  . None   Social History Narrative    Review of Systems  Constitutional: Negative for fever, chills, appetite change, fatigue and unexpected weight change.  Eyes: Negative for visual disturbance.  Respiratory: Negative for shortness of breath.   Cardiovascular: Negative for chest pain, palpitations and leg swelling.  Gastrointestinal: Negative for nausea, vomiting, abdominal pain, diarrhea and constipation.  Musculoskeletal: Negative for myalgias and arthralgias.  Skin: Negative for color change and rash.  Neurological: Negative for weakness and  headaches.  Hematological: Negative for adenopathy. Does not bruise/bleed easily.  Psychiatric/Behavioral: Negative for sleep disturbance and dysphoric mood. The patient is not nervous/anxious.        Objective:    BP 120/76 mmHg  Pulse 77  Temp(Src) 97.5 F (36.4 C) (Oral)  Ht 5' (1.524 m)  Wt 146 lb (66.225 kg)  BMI 28.51 kg/m2  SpO2 98% Physical Exam  Constitutional: She is oriented to person, place, and time. She appears well-developed and well-nourished. No distress.  HENT:  Head: Normocephalic and atraumatic.  Right Ear: External ear normal.  Left Ear: External ear normal.  Nose: Nose normal.  Mouth/Throat: Oropharynx is clear and moist. No oropharyngeal exudate.  Eyes: Conjunctivae are normal. Pupils are equal, round, and reactive to light. Right eye exhibits no discharge. Left eye exhibits no discharge. No scleral icterus.  Neck: Normal range of motion. Neck supple. No tracheal deviation present. No thyromegaly present.  Cardiovascular: Normal rate, regular rhythm, normal heart sounds and intact distal pulses.  Exam reveals no gallop and no friction rub.   No murmur heard. Pulmonary/Chest: Effort normal and breath sounds normal. No accessory muscle usage. No tachypnea. No respiratory distress. She has no decreased breath sounds. She has no wheezes. She has no rales. She exhibits no tenderness. Right breast exhibits no inverted nipple, no  mass, no nipple discharge, no skin change and no tenderness. Left breast exhibits no inverted nipple, no mass, no nipple discharge, no skin change and no tenderness. Breasts are symmetrical.    Abdominal: Soft. Bowel sounds are normal. She exhibits no distension and no mass. There is no tenderness. There is no rebound and no guarding.  Musculoskeletal: Normal range of motion. She exhibits no edema or tenderness.  Lymphadenopathy:    She has no cervical adenopathy.  Neurological: She is alert and oriented to person, place, and time. No  cranial nerve deficit. She exhibits normal muscle tone. Coordination normal.  Skin: Skin is warm and dry. No rash noted. She is not diaphoretic. No erythema. No pallor.  Psychiatric: She has a normal mood and affect. Her behavior is normal. Judgment and thought content normal.          Assessment & Plan:   Problem List Items Addressed This Visit      Unprioritized   Routine general medical examination at a health care facility - Primary    General medical exam normal today including breast exam. PAP and pelvic deferred per pt preference. Last PAP 2013, normal HPV neg. Plan repeat PAP in 2017. Mammogram ordered. Immunizations UTD. Labs reviewed with pt. Encouraged healthy diet and exercise.      Relevant Orders   MM Digital Screening       Return for Wellness Visit.

## 2015-01-22 ENCOUNTER — Encounter: Payer: Self-pay | Admitting: Cardiovascular Disease

## 2015-01-22 ENCOUNTER — Ambulatory Visit (INDEPENDENT_AMBULATORY_CARE_PROVIDER_SITE_OTHER): Payer: Medicare Other | Admitting: Cardiovascular Disease

## 2015-01-22 VITALS — BP 120/64 | HR 78 | Ht 60.0 in | Wt 144.8 lb

## 2015-01-22 DIAGNOSIS — I25111 Atherosclerotic heart disease of native coronary artery with angina pectoris with documented spasm: Secondary | ICD-10-CM | POA: Diagnosis not present

## 2015-01-22 DIAGNOSIS — I25810 Atherosclerosis of coronary artery bypass graft(s) without angina pectoris: Secondary | ICD-10-CM | POA: Diagnosis not present

## 2015-01-22 DIAGNOSIS — R635 Abnormal weight gain: Secondary | ICD-10-CM | POA: Diagnosis not present

## 2015-01-22 DIAGNOSIS — E785 Hyperlipidemia, unspecified: Secondary | ICD-10-CM

## 2015-01-22 DIAGNOSIS — G8929 Other chronic pain: Secondary | ICD-10-CM

## 2015-01-22 DIAGNOSIS — M549 Dorsalgia, unspecified: Secondary | ICD-10-CM

## 2015-01-22 DIAGNOSIS — I10 Essential (primary) hypertension: Secondary | ICD-10-CM

## 2015-01-22 DIAGNOSIS — I739 Peripheral vascular disease, unspecified: Secondary | ICD-10-CM

## 2015-01-22 MED ORDER — ROSUVASTATIN CALCIUM 40 MG PO TABS: ORAL_TABLET | ORAL | Status: DC

## 2015-01-22 MED ORDER — METOPROLOL TARTRATE 25 MG PO TABS: 12.5000 mg | ORAL_TABLET | Freq: Two times a day (BID) | ORAL | Status: DC

## 2015-01-22 NOTE — Progress Notes (Signed)
Patient ID: [name], female    DOB: 02/11/1946, 69 y.o.   MRN: [id number]  HPI Comments: [name] is a pleasant 69 year old woman with a history of coronary artery disease, stent to her left circumflex and 2 stents to the LAD in March of 2011, aorto femoral bypass grafting in early October 2011,  with occlusion of her coronary stents in mid-October requiring bypass surgery at [facility] in mid to late October 2012. She presents for routine followup of her coronary artery disease.  History of thyroid surgery March 2014  long history of smoking though stopped in March of 2011  plastic surgery on her face in the past  In follow-up today, she reports that she is active, denies any symptoms concerning for angina Feels that she is doing well on her current medication regimen. Trying to watch her weight, does over-the-counter supplements. 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 She is not doing any regular exercise program Total cholesterol discussed with her, recent lab work showing total cholesterol 150, LDL 60  EKG on today's visit shows normal sinus rhythm with rate 78 bpm, no significant ST or T-wave changes  Other past medical history  cardiac catheterization showed LIMA to the LAD, vein graft to the left circumflex, vein graft to the PDA which is occluded that supplies a very small area. Left main has 50% disease, ostial LAD has 95% in-stent restenosis, mid LAD has 100% disease, 75% ostial circumflex, 100% mid circumflex, very small OM1, 30% RCA disease, ejection fraction 45%  Stress test shows suboptimal images due to GI activity on resting images, significant anterior wall ischemia in the mid to distal segments, reduced ejection fraction of 47%     Prior ventricular arrhythmia when her stents were placed in the Cath Lab requiring shock.    echocardiogram improved, EF  greater than 40%.   Cardiac catheter report from July 15 2009  indicate she had a  vision bare metal stent 2.75 x 28 mm in the LCX, mini vision bare metal stent 2.5 x 28 and 2.5 x 15 mm stent to the LAD.   DJD in her neck and back    No Known Allergies  Outpatient Encounter Prescriptions as of 01/22/2015  Medication Sig  . ALPRAZolam (XANAX) 0.5 MG tablet Take 1 tablet (0.5 mg total) by mouth at bedtime.  Marland Kitchen aspirin 81 MG EC tablet Take 81 mg by mouth daily.    . Cholecalciferol (VITAMIN D3) 1000 UNITS CAPS Take 1,000 Units by mouth 1 day or 1 dose.    . Coenzyme Q10 100 MG capsule Take 100 mg by mouth daily.   Marland Kitchen HYDROcodone-acetaminophen (NORCO) 7.5-325 MG per tablet Take 1 tablet by mouth every 6 (six) hours as needed for moderate pain.  Marland Kitchen KRILL OIL PO Take 350 mg by mouth daily.  . metoprolol tartrate (LOPRESSOR) 25 MG tablet Take 0.5 tablets (12.5 mg total) by mouth 2 (two) times daily.  . Multiple Vitamins-Minerals (PRESERVISION AREDS 2 PO) Take by mouth.  . pantoprazole (PROTONIX) 40 MG tablet TAKE ONE (1) TABLET BY MOUTH EVERY DAY  . Probiotic Product (PROBIOTIC FORMULA) CAPS Take by mouth daily.  . rosuvastatin (CRESTOR) 40 MG tablet TAKE ONE (1) TABLET BY MOUTH EVERY DAY  . Turmeric Curcumin 500 MG CAPS Take by mouth daily.  . [DISCONTINUED] metoprolol tartrate (LOPRESSOR) 25 MG tablet TAKE 1/2 TABLET TWICE A DAY  . [DISCONTINUED] rosuvastatin (CRESTOR) 40 MG tablet TAKE ONE (1) TABLET BY MOUTH EVERY  DAY   No facility-administered encounter medications on file as of 01/22/2015.    Past Medical History  Diagnosis Date  . Coronary artery disease 2011    CABG x 3  . Hypertension   . Hyperlipidemia   . Macular degeneration     Past Surgical History  Procedure Laterality Date  . Coronary artery bypass graft  02/07/2010    x 3  . Foot surgery      right  . Cardiac catheterization  2013    [facility]  . Facial cosmetic surgery  2013    Dr. Nadeen Landau  . Parathyroidectomy      Social History  reports that she quit smoking about 5 years ago. Her smoking  use included Cigarettes. She has a 40 pack-year smoking history. She has never used smokeless tobacco. She reports that she drinks about 0.5 oz of alcohol per week. She reports that she does not use illicit drugs.  Family History Family history is unknown by patient.   Review of Systems  Constitutional: Negative.   Respiratory: Negative.   Cardiovascular: Negative.   Gastrointestinal: Negative.   Musculoskeletal: Positive for back pain.  Skin: Negative.   Neurological: Negative.   Hematological: Negative.   Psychiatric/Behavioral: Negative.   All other systems reviewed and are negative.   BP 120/64 mmHg  Pulse 78  Ht 5' (1.524 m)  Wt 144 lb 12 oz (65.658 kg)  BMI 28.27 kg/m2  Physical Exam  Constitutional: She is oriented to person, place, and time. She appears well-developed and well-nourished.  HENT:  Head: Normocephalic.  Nose: Nose normal.  Mouth/Throat: Oropharynx is clear and moist.  Eyes: Conjunctivae are normal. Pupils are equal, round, and reactive to light.  Neck: Normal range of motion. Neck supple. No JVD present.  Cardiovascular: Normal rate, regular rhythm, S1 normal, S2 normal, normal heart sounds and intact distal pulses.  Exam reveals no gallop and no friction rub.   No murmur heard. Pulmonary/Chest: Effort normal and breath sounds normal. No respiratory distress. She has no wheezes. She has no rales. She exhibits no tenderness.  Abdominal: Soft. Bowel sounds are normal. She exhibits no distension. There is no tenderness.  Musculoskeletal: Normal range of motion. She exhibits no edema or tenderness.  Lymphadenopathy:    She has no cervical adenopathy.  Neurological: She is alert and oriented to person, place, and time. Coordination normal.  Skin: Skin is warm and dry. No rash noted. No erythema.  Psychiatric: She has a normal mood and affect. Her behavior is normal. Judgment and thought content normal.    Assessment and Plan  Nursing note and vitals  reviewed.

## 2015-01-22 NOTE — Patient Instructions (Addendum)
You are doing well. No medication changes were made.  Please call us if you have new issues that need to be addressed before your next appt.  Your physician wants you to follow-up in: 6 months.  You will receive a reminder letter in the mail two months in advance. If you don't receive a letter, please call our office to schedule the follow-up appointment.   

## 2015-01-22 NOTE — Assessment & Plan Note (Signed)
Blood pressure is well controlled on today's visit. No changes made to the medications. 

## 2015-01-22 NOTE — Assessment & Plan Note (Signed)
Recommended a regular aerobic program

## 2015-01-22 NOTE — Assessment & Plan Note (Signed)
Cholesterol is at goal on the current lipid regimen. No changes to the medications were made.  

## 2015-01-22 NOTE — Assessment & Plan Note (Signed)
aorto fem bypass graft, no claudication symptoms  repeat ultrasound on her next visit

## 2015-01-22 NOTE — Assessment & Plan Note (Signed)
Recommended weight loss, stretching exercises. She does not want to retry water aerobics

## 2015-01-22 NOTE — Assessment & Plan Note (Signed)
Currently with no symptoms concerning for angina. No further testing at this time

## 2015-01-24 ENCOUNTER — Encounter (INDEPENDENT_AMBULATORY_CARE_PROVIDER_SITE_OTHER): Payer: Medicare Other | Admitting: Ophthalmology

## 2015-01-24 DIAGNOSIS — H43813 Vitreous degeneration, bilateral: Secondary | ICD-10-CM | POA: Diagnosis not present

## 2015-01-24 DIAGNOSIS — H35033 Hypertensive retinopathy, bilateral: Secondary | ICD-10-CM

## 2015-01-24 DIAGNOSIS — H353222 Exudative age-related macular degeneration, left eye, with inactive choroidal neovascularization: Secondary | ICD-10-CM

## 2015-01-24 DIAGNOSIS — I10 Essential (primary) hypertension: Secondary | ICD-10-CM

## 2015-01-24 DIAGNOSIS — H353112 Nonexudative age-related macular degeneration, right eye, intermediate dry stage: Secondary | ICD-10-CM

## 2015-02-17 ENCOUNTER — Encounter: Payer: Self-pay | Admitting: Internal Medicine

## 2015-02-21 ENCOUNTER — Encounter (INDEPENDENT_AMBULATORY_CARE_PROVIDER_SITE_OTHER): Payer: Medicare Other | Admitting: Ophthalmology

## 2015-02-21 DIAGNOSIS — H353221 Exudative age-related macular degeneration, left eye, with active choroidal neovascularization: Secondary | ICD-10-CM

## 2015-02-21 DIAGNOSIS — H353121 Nonexudative age-related macular degeneration, left eye, early dry stage: Secondary | ICD-10-CM | POA: Diagnosis not present

## 2015-02-21 DIAGNOSIS — H43813 Vitreous degeneration, bilateral: Secondary | ICD-10-CM | POA: Diagnosis not present

## 2015-02-21 DIAGNOSIS — I10 Essential (primary) hypertension: Secondary | ICD-10-CM | POA: Diagnosis not present

## 2015-02-26 ENCOUNTER — Encounter: Payer: Self-pay | Admitting: Internal Medicine

## 2015-02-28 LAB — HM MAMMOGRAPHY: HM Mammogram: NEGATIVE

## 2015-03-18 ENCOUNTER — Encounter: Payer: Self-pay | Admitting: Internal Medicine

## 2015-03-21 ENCOUNTER — Encounter (INDEPENDENT_AMBULATORY_CARE_PROVIDER_SITE_OTHER): Payer: Medicare Other | Admitting: Ophthalmology

## 2015-03-21 DIAGNOSIS — H2513 Age-related nuclear cataract, bilateral: Secondary | ICD-10-CM

## 2015-03-21 DIAGNOSIS — H353221 Exudative age-related macular degeneration, left eye, with active choroidal neovascularization: Secondary | ICD-10-CM

## 2015-03-21 DIAGNOSIS — I10 Essential (primary) hypertension: Secondary | ICD-10-CM | POA: Diagnosis not present

## 2015-03-21 DIAGNOSIS — H353112 Nonexudative age-related macular degeneration, right eye, intermediate dry stage: Secondary | ICD-10-CM | POA: Diagnosis not present

## 2015-03-21 DIAGNOSIS — H35033 Hypertensive retinopathy, bilateral: Secondary | ICD-10-CM | POA: Diagnosis not present

## 2015-03-21 DIAGNOSIS — H43813 Vitreous degeneration, bilateral: Secondary | ICD-10-CM

## 2015-04-03 ENCOUNTER — Encounter: Payer: Self-pay | Admitting: Internal Medicine

## 2015-04-04 ENCOUNTER — Other Ambulatory Visit: Payer: Self-pay | Admitting: Internal Medicine

## 2015-04-04 MED ORDER — ALPRAZOLAM 0.5 MG PO TABS: 0.5000 mg | ORAL_TABLET | Freq: Every day | ORAL | Status: DC

## 2015-04-25 ENCOUNTER — Encounter (INDEPENDENT_AMBULATORY_CARE_PROVIDER_SITE_OTHER): Payer: Medicare HMO | Admitting: Ophthalmology

## 2015-04-25 DIAGNOSIS — I10 Essential (primary) hypertension: Secondary | ICD-10-CM

## 2015-04-25 DIAGNOSIS — H353112 Nonexudative age-related macular degeneration, right eye, intermediate dry stage: Secondary | ICD-10-CM

## 2015-04-25 DIAGNOSIS — H35033 Hypertensive retinopathy, bilateral: Secondary | ICD-10-CM | POA: Diagnosis not present

## 2015-04-25 DIAGNOSIS — H43813 Vitreous degeneration, bilateral: Secondary | ICD-10-CM

## 2015-04-25 DIAGNOSIS — H353221 Exudative age-related macular degeneration, left eye, with active choroidal neovascularization: Secondary | ICD-10-CM

## 2015-05-21 ENCOUNTER — Encounter: Payer: Self-pay | Admitting: *Deleted

## 2015-05-28 ENCOUNTER — Other Ambulatory Visit: Payer: Self-pay | Admitting: Internal Medicine

## 2015-05-30 ENCOUNTER — Encounter (INDEPENDENT_AMBULATORY_CARE_PROVIDER_SITE_OTHER): Payer: Medicare HMO | Admitting: Ophthalmology

## 2015-05-30 DIAGNOSIS — H2513 Age-related nuclear cataract, bilateral: Secondary | ICD-10-CM | POA: Diagnosis not present

## 2015-05-30 DIAGNOSIS — H43813 Vitreous degeneration, bilateral: Secondary | ICD-10-CM | POA: Diagnosis not present

## 2015-05-30 DIAGNOSIS — I10 Essential (primary) hypertension: Secondary | ICD-10-CM

## 2015-05-30 DIAGNOSIS — H35033 Hypertensive retinopathy, bilateral: Secondary | ICD-10-CM | POA: Diagnosis not present

## 2015-05-30 DIAGNOSIS — H353221 Exudative age-related macular degeneration, left eye, with active choroidal neovascularization: Secondary | ICD-10-CM | POA: Diagnosis not present

## 2015-05-30 DIAGNOSIS — H353112 Nonexudative age-related macular degeneration, right eye, intermediate dry stage: Secondary | ICD-10-CM | POA: Diagnosis not present

## 2015-06-11 ENCOUNTER — Telehealth: Payer: Self-pay | Admitting: Cardiovascular Disease

## 2015-06-11 NOTE — Telephone Encounter (Signed)
Pt came into the office requesting EKG for chest pain. Informed pt if she has active CP, she needs to go directly to the ER. She does not want to go as she states she had to wait a long time last time she went and doesn't want to pick up "all the germs in the ER". We reviewed reasons to be seen emergently. Pt states she lives alone and is worried to be by herself. She refused to go to the ER and states she might go to [facility] [address] to be seen. She made an appt for Feb 22 with Dr. Rockey Situ. Pt left the office.  Called pt as she inquired of pain meds masking CP symptoms. States she took another pain pill and is currently chest pain free. Again, I reviewed reasons to be seen immediately in the ER. She verbalized understanding and confirmed 2/22 appt. Pt had no further questions.

## 2015-06-11 NOTE — Telephone Encounter (Signed)
Patient wants to know if pain meds she took for back would mask symptoms of cp and skew results of ekg.  Please call to discuss.

## 2015-06-11 NOTE — Telephone Encounter (Signed)
Pt c/o of Chest Pain: STAT if CP now or developed within 24 hours  1. Are you having CP right now? yes  2. Are you experiencing any other symptoms (ex. SOB, nausea, vomiting, sweating)? Woke up with reflux at 3 am  3. How long have you been experiencing CP? Earlier today   4. Is your CP continuous or coming and going? Coming and going   5. Have you taken Nitroglycerin? No  But she takes hydrocodone for back pain last taken at 2 pm  ?  Wants to have ekg while in office

## 2015-06-12 ENCOUNTER — Ambulatory Visit (INDEPENDENT_AMBULATORY_CARE_PROVIDER_SITE_OTHER): Payer: Medicare HMO | Admitting: Cardiovascular Disease

## 2015-06-12 ENCOUNTER — Encounter: Payer: Self-pay | Admitting: Cardiovascular Disease

## 2015-06-12 VITALS — BP 120/78 | HR 83 | Ht 60.0 in | Wt 149.5 lb

## 2015-06-12 DIAGNOSIS — I10 Essential (primary) hypertension: Secondary | ICD-10-CM

## 2015-06-12 DIAGNOSIS — R079 Chest pain, unspecified: Secondary | ICD-10-CM

## 2015-06-12 DIAGNOSIS — I25810 Atherosclerosis of coronary artery bypass graft(s) without angina pectoris: Secondary | ICD-10-CM | POA: Diagnosis not present

## 2015-06-12 DIAGNOSIS — E785 Hyperlipidemia, unspecified: Secondary | ICD-10-CM

## 2015-06-12 DIAGNOSIS — I739 Peripheral vascular disease, unspecified: Secondary | ICD-10-CM

## 2015-06-12 NOTE — Assessment & Plan Note (Signed)
Repeat cholesterol pending Dramatic weight gain Recommended a strict diet

## 2015-06-12 NOTE — Patient Instructions (Addendum)
You are doing well. No medication changes were made.  We will schedule fasting labs next Tuesday.  Please call us if you have new issues that need to be addressed before your next appt.  Your physician wants you to follow-up in: 6 months.  You will receive a reminder letter in the mail two months in advance. If you don't receive a letter, please call our office to schedule the follow-up appointment.

## 2015-06-12 NOTE — Assessment & Plan Note (Signed)
Blood pressure is well controlled on today's visit. No changes made to the medications. 

## 2015-06-12 NOTE — Assessment & Plan Note (Signed)
aorto fem bypass graft, no claudication symptoms  repeat ultrasound on her next visit 

## 2015-06-12 NOTE — Progress Notes (Signed)
Patient ID: [name], female    DOB: 1946-02-04, 70 y.o.   MRN: [id number]  HPI Comments: [name] is a pleasant 70 year old woman with a history of coronary artery disease, stent to her left circumflex and 2 stents to the LAD in March of 2011, aorto femoral bypass grafting in early October 2011,  with occlusion of her coronary stents in mid-October requiring bypass surgery at [facility] in mid to late October 2012. She presents for routine followup of her coronary artery disease.  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  EKG on today's visit shows normal sinus rhythm with rate 83 bpm, Nonspecific ST abnormality  Other past medical history  cardiac catheterization showed LIMA to the LAD, vein graft to the left circumflex, vein graft to the PDA which is occluded that supplies a very small area. Left main has 50% disease, ostial LAD has 95% in-stent restenosis, mid LAD has 100% disease, 75% ostial circumflex, 100% mid circumflex, very small OM1, 30% RCA disease, ejection fraction 45%  Stress test shows suboptimal images due to GI activity on resting images, significant anterior wall ischemia in the mid to distal segments, reduced ejection fraction of 47%     Prior ventricular arrhythmia when her stents were placed in the Cath Lab requiring shock.    echocardiogram improved, EF  greater than 40%.   Cardiac catheter report from July 15 2009  indicate she had a vision bare metal stent 2.75 x 28 mm in the LCX, mini vision bare metal stent 2.5 x 28 and 2.5 x 15 mm stent to the LAD.   DJD in her neck and back    No Known Allergies  Outpatient  Encounter Prescriptions as of 06/12/2015  Medication Sig  . ALPRAZolam (XANAX) 0.5 MG tablet Take 1 tablet (0.5 mg total) by mouth at bedtime.  Marland Kitchen aspirin 81 MG EC tablet Take 81 mg by mouth daily.    . Cholecalciferol (VITAMIN D3) 1000 UNITS CAPS Take 1,000 Units by mouth 1 day or 1 dose.    . Coenzyme Q10 100 MG capsule Take 100 mg by mouth daily.   Marland Kitchen HYDROcodone-acetaminophen (NORCO) 7.5-325 MG per tablet Take 1 tablet by mouth every 6 (six) hours as needed for moderate pain.  Marland Kitchen KRILL OIL PO Take 350 mg by mouth daily.  . metoprolol tartrate (LOPRESSOR) 25 MG tablet Take 0.5 tablets (12.5 mg total) by mouth 2 (two) times daily.  . Multiple Vitamins-Minerals (PRESERVISION AREDS 2 PO) Take by mouth.  . pantoprazole (PROTONIX) 40 MG tablet TAKE ONE (1) TABLET BY MOUTH EVERY DAY  . Probiotic Product (PROBIOTIC FORMULA) CAPS Take by mouth daily.  . rosuvastatin (CRESTOR) 40 MG tablet TAKE ONE (1) TABLET BY MOUTH EVERY DAY  . Turmeric Curcumin 500 MG CAPS Take by mouth daily.   No facility-administered encounter medications on file as of 06/12/2015.    Past Medical History  Diagnosis Date  . Coronary artery disease 2011    CABG x 3  . Hypertension   . Hyperlipidemia   . Macular degeneration     Past Surgical History  Procedure Laterality Date  . Coronary  artery bypass graft  02/07/2010    x 3  . Foot surgery      right  . Cardiac catheterization  2013    [facility]  . Facial cosmetic surgery  2013    [name]  . Parathyroidectomy      Social History  reports that she quit smoking about 5 years ago. Her smoking use included Cigarettes. She has a 40 pack-year smoking history. She has never used smokeless tobacco. She reports that she drinks about 0.5 oz of alcohol per week. She reports that she does not use illicit drugs.  Family History Family history is unknown by patient.   Review of Systems  Constitutional: Negative.   Respiratory: Negative.   Cardiovascular:  Negative.   Gastrointestinal: Negative.   Musculoskeletal: Negative.   Skin: Negative.   Neurological: Negative.   Hematological: Negative.   Psychiatric/Behavioral: Negative.   All other systems reviewed and are negative.   BP 120/78 mmHg  Pulse 83  Ht 5' (1.524 m)  Wt 149 lb 8 oz (67.813 kg)  BMI 29.20 kg/m2  Physical Exam  Constitutional: She is oriented to person, place, and time. She appears well-developed and well-nourished.  HENT:  Head: Normocephalic.  Nose: Nose normal.  Mouth/Throat: Oropharynx is clear and moist.  Eyes: Conjunctivae are normal. Pupils are equal, round, and reactive to light.  Neck: Normal range of motion. Neck supple. No JVD present.  Cardiovascular: Normal rate, regular rhythm, S1 normal, S2 normal, normal heart sounds and intact distal pulses.  Exam reveals no gallop and no friction rub.   No murmur heard. Pulmonary/Chest: Effort normal and breath sounds normal. No respiratory distress. She has no wheezes. She has no rales. She exhibits no tenderness.  Abdominal: Soft. Bowel sounds are normal. She exhibits no distension. There is no tenderness.  Musculoskeletal: Normal range of motion. She exhibits no edema or tenderness.  Lymphadenopathy:    She has no cervical adenopathy.  Neurological: She is alert and oriented to person, place, and time. Coordination normal.  Skin: Skin is warm and dry. No rash noted. No erythema.  Psychiatric: She has a normal mood and affect. Her behavior is normal. Judgment and thought content normal.    Assessment and Plan  Nursing note and vitals reviewed.

## 2015-06-12 NOTE — Assessment & Plan Note (Signed)
Currently with no symptoms of angina. No further workup at this time. Continue current medication regimen. 

## 2015-06-18 ENCOUNTER — Other Ambulatory Visit (INDEPENDENT_AMBULATORY_CARE_PROVIDER_SITE_OTHER): Payer: Medicare HMO | Admitting: *Deleted

## 2015-06-18 DIAGNOSIS — E785 Hyperlipidemia, unspecified: Secondary | ICD-10-CM | POA: Diagnosis not present

## 2015-06-18 DIAGNOSIS — M40204 Unspecified kyphosis, thoracic region: Secondary | ICD-10-CM | POA: Diagnosis not present

## 2015-06-18 DIAGNOSIS — M810 Age-related osteoporosis without current pathological fracture: Secondary | ICD-10-CM | POA: Diagnosis not present

## 2015-06-18 DIAGNOSIS — I25810 Atherosclerosis of coronary artery bypass graft(s) without angina pectoris: Secondary | ICD-10-CM | POA: Diagnosis not present

## 2015-06-19 LAB — LIPID PANEL
CHOLESTEROL TOTAL: 130 mg/dL (ref 100–199)
Chol/HDL Ratio: 2.1 ratio units (ref 0.0–4.4)
HDL: 61 mg/dL (ref >39)
LDL CALC: 51 mg/dL (ref 0–99)
Triglycerides: 89 mg/dL (ref 0–149)
VLDL CHOLESTEROL CAL: 18 mg/dL (ref 5–40)

## 2015-06-19 LAB — HEPATIC FUNCTION PANEL
ALBUMIN: 4.4 g/dL (ref 3.6–4.8)
ALK PHOS: 69 IU/L (ref 39–117)
ALT: 44 IU/L — ABNORMAL HIGH (ref 0–32)
AST: 49 IU/L — ABNORMAL HIGH (ref 0–40)
BILIRUBIN TOTAL: 0.5 mg/dL (ref 0.0–1.2)
Bilirubin, Direct: 0.13 mg/dL (ref 0.00–0.40)
TOTAL PROTEIN: 6.2 g/dL (ref 6.0–8.5)

## 2015-06-22 ENCOUNTER — Encounter: Payer: Self-pay | Admitting: Cardiovascular Disease

## 2015-06-24 ENCOUNTER — Encounter: Payer: Self-pay | Admitting: Cardiovascular Disease

## 2015-06-25 ENCOUNTER — Encounter: Payer: Self-pay | Admitting: Cardiovascular Disease

## 2015-06-26 ENCOUNTER — Telehealth: Payer: Self-pay | Admitting: Cardiovascular Disease

## 2015-06-26 DIAGNOSIS — R748 Abnormal levels of other serum enzymes: Secondary | ICD-10-CM

## 2015-06-26 NOTE — Telephone Encounter (Signed)
Pt has a question regarding her labs. States she received a Pharmacist, community message, but didn't understand it. Please call.

## 2015-06-26 NOTE — Telephone Encounter (Signed)
Pt viewed her labs on MyChart and is concerned about her elevated AST & ALT.  She would like to know if she needs to be concerned and if Dr. Rockey Situ recommends any further testing.  Please advise.  Thank you.

## 2015-06-27 NOTE — Telephone Encounter (Signed)
Spoke w/ pt.  She is shocked that her chol # have improved, as she has not been eating right or exercising.  She is concerned about elevated AST & ALT, as she takes Crestor and hydrocodone.  She would like to know if she needs further testing to evaluate her liver fx.  Advised her that I will call her back w/ Dr. Donivan Scull recommendation.

## 2015-06-27 NOTE — Telephone Encounter (Signed)
Spoke w/ pt.  Advised her of Dr. Gollan's recommendation.  She verbalizes understanding and will call back w/ any further questions or concerns.  

## 2015-06-27 NOTE — Telephone Encounter (Signed)
Left message for pt to call back  °

## 2015-06-27 NOTE — Telephone Encounter (Signed)
No further testing needed for liver Numbers are barely elevated Most likely secondary to what they call a "fatty liver" Would stay on Crestor, okay to take her pain pill Would recheck LFTs in 6 months time

## 2015-07-04 ENCOUNTER — Encounter (INDEPENDENT_AMBULATORY_CARE_PROVIDER_SITE_OTHER): Payer: Medicare HMO | Admitting: Ophthalmology

## 2015-07-04 DIAGNOSIS — I10 Essential (primary) hypertension: Secondary | ICD-10-CM

## 2015-07-04 DIAGNOSIS — H353112 Nonexudative age-related macular degeneration, right eye, intermediate dry stage: Secondary | ICD-10-CM | POA: Diagnosis not present

## 2015-07-04 DIAGNOSIS — H353221 Exudative age-related macular degeneration, left eye, with active choroidal neovascularization: Secondary | ICD-10-CM | POA: Diagnosis not present

## 2015-07-04 DIAGNOSIS — H2513 Age-related nuclear cataract, bilateral: Secondary | ICD-10-CM

## 2015-07-04 DIAGNOSIS — H43813 Vitreous degeneration, bilateral: Secondary | ICD-10-CM | POA: Diagnosis not present

## 2015-07-04 DIAGNOSIS — H35033 Hypertensive retinopathy, bilateral: Secondary | ICD-10-CM

## 2015-07-15 ENCOUNTER — Encounter: Payer: Self-pay | Admitting: Cardiovascular Disease

## 2015-07-20 ENCOUNTER — Encounter: Payer: Self-pay | Admitting: Internal Medicine

## 2015-07-30 ENCOUNTER — Ambulatory Visit: Payer: Medicare HMO | Admitting: Internal Medicine

## 2015-08-05 ENCOUNTER — Other Ambulatory Visit: Payer: Self-pay | Admitting: Internal Medicine

## 2015-08-05 NOTE — Telephone Encounter (Signed)
Okay to refill? Last refilled on 04/04/15 #30 with 3 refills. Last OV: 01/17/15. No future appt scheduled. Gilford Rile patient)

## 2015-08-06 NOTE — Telephone Encounter (Signed)
Rx faxed

## 2015-08-06 NOTE — Telephone Encounter (Signed)
Refill for 30 days only.  OFFICE VISIT NEEDED WITH WALKER  prior to any more refills

## 2015-08-08 ENCOUNTER — Encounter: Payer: Self-pay | Admitting: Internal Medicine

## 2015-08-08 ENCOUNTER — Encounter (INDEPENDENT_AMBULATORY_CARE_PROVIDER_SITE_OTHER): Payer: Medicare HMO | Admitting: Ophthalmology

## 2015-08-08 DIAGNOSIS — H43813 Vitreous degeneration, bilateral: Secondary | ICD-10-CM

## 2015-08-08 DIAGNOSIS — H353121 Nonexudative age-related macular degeneration, left eye, early dry stage: Secondary | ICD-10-CM

## 2015-08-08 DIAGNOSIS — H353221 Exudative age-related macular degeneration, left eye, with active choroidal neovascularization: Secondary | ICD-10-CM

## 2015-08-08 DIAGNOSIS — H35033 Hypertensive retinopathy, bilateral: Secondary | ICD-10-CM

## 2015-08-08 DIAGNOSIS — I10 Essential (primary) hypertension: Secondary | ICD-10-CM | POA: Diagnosis not present

## 2015-09-05 ENCOUNTER — Other Ambulatory Visit: Payer: Self-pay | Admitting: Internal Medicine

## 2015-09-05 DIAGNOSIS — M47816 Spondylosis without myelopathy or radiculopathy, lumbar region: Secondary | ICD-10-CM | POA: Diagnosis not present

## 2015-09-05 DIAGNOSIS — M47812 Spondylosis without myelopathy or radiculopathy, cervical region: Secondary | ICD-10-CM | POA: Diagnosis not present

## 2015-09-06 ENCOUNTER — Other Ambulatory Visit: Payer: Self-pay | Admitting: Internal Medicine

## 2015-09-06 NOTE — Telephone Encounter (Signed)
No OV since 9/16 ok to fill alprazolam?

## 2015-09-08 ENCOUNTER — Encounter: Payer: Self-pay | Admitting: Internal Medicine

## 2015-09-11 ENCOUNTER — Encounter: Payer: Self-pay | Admitting: Internal Medicine

## 2015-09-12 DIAGNOSIS — H2512 Age-related nuclear cataract, left eye: Secondary | ICD-10-CM | POA: Diagnosis not present

## 2015-09-12 DIAGNOSIS — H25011 Cortical age-related cataract, right eye: Secondary | ICD-10-CM | POA: Diagnosis not present

## 2015-09-12 DIAGNOSIS — H2511 Age-related nuclear cataract, right eye: Secondary | ICD-10-CM | POA: Diagnosis not present

## 2015-09-12 DIAGNOSIS — H25012 Cortical age-related cataract, left eye: Secondary | ICD-10-CM | POA: Diagnosis not present

## 2015-09-12 DIAGNOSIS — H353221 Exudative age-related macular degeneration, left eye, with active choroidal neovascularization: Secondary | ICD-10-CM | POA: Diagnosis not present

## 2015-09-12 DIAGNOSIS — H3581 Retinal edema: Secondary | ICD-10-CM | POA: Diagnosis not present

## 2015-09-12 DIAGNOSIS — H353112 Nonexudative age-related macular degeneration, right eye, intermediate dry stage: Secondary | ICD-10-CM | POA: Diagnosis not present

## 2015-09-12 LAB — HM DIABETES EYE EXAM

## 2015-09-13 ENCOUNTER — Telehealth: Payer: Self-pay | Admitting: Internal Medicine

## 2015-09-13 NOTE — Telephone Encounter (Signed)
The patient is requesting a lab requisition to be mailed to her home to have labs drawn at [facility]. Full panel.

## 2015-09-13 NOTE — Telephone Encounter (Signed)
Please advise and order if needed, thanks.  Last labs were on 06/18/2015.

## 2015-09-17 ENCOUNTER — Encounter: Payer: Self-pay | Admitting: Internal Medicine

## 2015-09-17 NOTE — Telephone Encounter (Signed)
[facility] sheet filled out to mail

## 2015-09-17 NOTE — Telephone Encounter (Signed)
Lab order mailed to patient.

## 2015-09-18 ENCOUNTER — Encounter: Payer: Self-pay | Admitting: Internal Medicine

## 2015-09-18 ENCOUNTER — Telehealth: Payer: Self-pay

## 2015-09-18 NOTE — Telephone Encounter (Signed)
Thank you. Will continue to follow as appropriate.

## 2015-09-18 NOTE — Telephone Encounter (Signed)
Patient is on the list for [employer] 2017 and may be a good candidate for an AWV in 2017.  Last CPE was in September 2016. Media scan of insurance card shows pt had [facility] Medicare last year for her CPE. Pt has an OV scheduled for June for a specific issue.

## 2015-09-19 ENCOUNTER — Encounter (INDEPENDENT_AMBULATORY_CARE_PROVIDER_SITE_OTHER): Payer: Medicare HMO | Admitting: Ophthalmology

## 2015-09-19 DIAGNOSIS — H353112 Nonexudative age-related macular degeneration, right eye, intermediate dry stage: Secondary | ICD-10-CM

## 2015-09-19 DIAGNOSIS — H35033 Hypertensive retinopathy, bilateral: Secondary | ICD-10-CM

## 2015-09-19 DIAGNOSIS — I10 Essential (primary) hypertension: Secondary | ICD-10-CM

## 2015-09-19 DIAGNOSIS — H43813 Vitreous degeneration, bilateral: Secondary | ICD-10-CM | POA: Diagnosis not present

## 2015-09-19 DIAGNOSIS — H353221 Exudative age-related macular degeneration, left eye, with active choroidal neovascularization: Secondary | ICD-10-CM

## 2015-09-19 DIAGNOSIS — H2513 Age-related nuclear cataract, bilateral: Secondary | ICD-10-CM

## 2015-09-26 ENCOUNTER — Other Ambulatory Visit: Payer: Self-pay | Admitting: Internal Medicine

## 2015-09-26 DIAGNOSIS — I10 Essential (primary) hypertension: Secondary | ICD-10-CM | POA: Diagnosis not present

## 2015-09-27 LAB — LIPID PANEL W/O CHOL/HDL RATIO
Cholesterol, Total: 148 mg/dL (ref 100–199)
HDL: 74 mg/dL (ref >39)
LDL Calculated: 62 mg/dL (ref 0–99)
Triglycerides: 59 mg/dL (ref 0–149)
VLDL Cholesterol Cal: 12 mg/dL (ref 5–40)

## 2015-09-27 LAB — COMPREHENSIVE METABOLIC PANEL
ALBUMIN: 4.6 g/dL (ref 3.6–4.8)
ALT: 26 IU/L (ref 0–32)
AST: 32 IU/L (ref 0–40)
Albumin/Globulin Ratio: 2.1 (ref 1.2–2.2)
Alkaline Phosphatase: 69 IU/L (ref 39–117)
BUN/Creatinine Ratio: 24 (ref 12–28)
BUN: 18 mg/dL (ref 8–27)
Bilirubin Total: 0.3 mg/dL (ref 0.0–1.2)
CALCIUM: 10.5 mg/dL — AB (ref 8.7–10.3)
CO2: 21 mmol/L (ref 18–29)
CREATININE: 0.76 mg/dL (ref 0.57–1.00)
Chloride: 97 mmol/L (ref 96–106)
GFR calc Af Amer: 94 mL/min/{1.73_m2} (ref >59)
GFR, EST NON AFRICAN AMERICAN: 81 mL/min/{1.73_m2} (ref >59)
GLOBULIN, TOTAL: 2.2 g/dL (ref 1.5–4.5)
Glucose: 93 mg/dL (ref 65–99)
Potassium: 4.6 mmol/L (ref 3.5–5.2)
SODIUM: 138 mmol/L (ref 134–144)
Total Protein: 6.8 g/dL (ref 6.0–8.5)

## 2015-09-27 LAB — HGB A1C W/O EAG: Hgb A1c MFr Bld: 5.7 % — ABNORMAL HIGH (ref 4.8–5.6)

## 2015-09-27 LAB — CBC WITH DIFFERENTIAL/PLATELET
Basophils Absolute: 0.1 10*3/uL (ref 0.0–0.2)
Basos: 1 %
EOS (ABSOLUTE): 0.3 10*3/uL (ref 0.0–0.4)
EOS: 4 %
HEMATOCRIT: 41.4 % (ref 34.0–46.6)
HEMOGLOBIN: 13.9 g/dL (ref 11.1–15.9)
IMMATURE GRANULOCYTES: 0 %
Immature Grans (Abs): 0 10*3/uL (ref 0.0–0.1)
LYMPHS ABS: 2.2 10*3/uL (ref 0.7–3.1)
Lymphs: 27 %
MCH: 31.3 pg (ref 26.6–33.0)
MCHC: 33.6 g/dL (ref 31.5–35.7)
MCV: 93 fL (ref 79–97)
MONOCYTES: 14 %
MONOS ABS: 1.1 10*3/uL — AB (ref 0.1–0.9)
NEUTROS ABS: 4.3 10*3/uL (ref 1.4–7.0)
Neutrophils: 54 %
Platelets: 241 10*3/uL (ref 150–379)
RBC: 4.44 x10E6/uL (ref 3.77–5.28)
RDW: 14 % (ref 12.3–15.4)
WBC: 8 10*3/uL (ref 3.4–10.8)

## 2015-09-27 LAB — TSH: TSH: 3.17 u[IU]/mL (ref 0.450–4.500)

## 2015-09-27 LAB — VITAMIN D 25 HYDROXY (VIT D DEFICIENCY, FRACTURES): VIT D 25 HYDROXY: 34.2 ng/mL (ref 30.0–100.0)

## 2015-10-01 DIAGNOSIS — H2512 Age-related nuclear cataract, left eye: Secondary | ICD-10-CM | POA: Diagnosis not present

## 2015-10-03 ENCOUNTER — Other Ambulatory Visit: Payer: Self-pay | Admitting: Internal Medicine

## 2015-10-03 ENCOUNTER — Ambulatory Visit (INDEPENDENT_AMBULATORY_CARE_PROVIDER_SITE_OTHER): Payer: Medicare HMO | Admitting: Internal Medicine

## 2015-10-03 ENCOUNTER — Encounter: Payer: Self-pay | Admitting: Internal Medicine

## 2015-10-03 ENCOUNTER — Telehealth: Payer: Self-pay | Admitting: *Deleted

## 2015-10-03 VITALS — BP 142/82 | HR 70 | Ht 60.0 in | Wt 152.8 lb

## 2015-10-03 DIAGNOSIS — M549 Dorsalgia, unspecified: Secondary | ICD-10-CM

## 2015-10-03 DIAGNOSIS — G8929 Other chronic pain: Secondary | ICD-10-CM

## 2015-10-03 DIAGNOSIS — I10 Essential (primary) hypertension: Secondary | ICD-10-CM

## 2015-10-03 DIAGNOSIS — Z1159 Encounter for screening for other viral diseases: Secondary | ICD-10-CM | POA: Insufficient documentation

## 2015-10-03 DIAGNOSIS — R635 Abnormal weight gain: Secondary | ICD-10-CM | POA: Diagnosis not present

## 2015-10-03 DIAGNOSIS — E785 Hyperlipidemia, unspecified: Secondary | ICD-10-CM | POA: Diagnosis not present

## 2015-10-03 DIAGNOSIS — I25810 Atherosclerosis of coronary artery bypass graft(s) without angina pectoris: Secondary | ICD-10-CM | POA: Diagnosis not present

## 2015-10-03 DIAGNOSIS — M810 Age-related osteoporosis without current pathological fracture: Secondary | ICD-10-CM

## 2015-10-03 MED ORDER — ALPRAZOLAM 0.5 MG PO TABS: 0.5000 mg | ORAL_TABLET | Freq: Every day | ORAL | Status: DC

## 2015-10-03 MED ORDER — PREDNISONE 10 MG PO TABS: ORAL_TABLET | ORAL | Status: DC

## 2015-10-03 NOTE — Assessment & Plan Note (Signed)
Recent calcium was mildly elevated. She has h/p primary hyperparathyroidism. Will check Ca and PTH with labs.

## 2015-10-03 NOTE — Progress Notes (Signed)
Subjective:    Patient ID: [name], female    DOB: 23-Jun-1945, 70 y.o.   MRN: [id number]  HPI  70YO female presents for follow up.  Weight gain - Worried about weight gain. Notes some dietary indiscretion. Started back on Isogenix protein shakes. Walking some, but limited by back pain.  Had cataract surgery on Tuesday. Tolerated well. No side effects noted.  HTN/CAD - No recent chest pain, palpitations, HA. Compliant with medication.    Wt Readings from Last 3 Encounters:  10/03/15 152 lb 12.8 oz (69.31 kg)  06/12/15 149 lb 8 oz (67.813 kg)  01/22/15 144 lb 12 oz (65.658 kg)   BP Readings from Last 3 Encounters:  10/03/15 142/82  06/12/15 120/78  01/22/15 120/64    Past Medical History  Diagnosis Date  . Coronary artery disease 2011    CABG x 3  . Hypertension   . Hyperlipidemia   . Macular degeneration    Family History  Problem Relation Age of Onset  . Family history unknown: Yes   Past Surgical History  Procedure Laterality Date  . Coronary artery bypass graft  02/07/2010    x 3  . Foot surgery      right  . Cardiac catheterization  2013    [facility]  . Facial cosmetic surgery  2013    Dr. Nadeen Landau  . Parathyroidectomy    . Cataract extraction     Social History   Social History  . Marital Status: Single    Spouse Name: N/A  . Number of Children: N/A  . Years of Education: N/A   Social History Main Topics  . Smoking status: Former Smoker -- 1.00 packs/day for 40 years    Types: Cigarettes    Quit date: 07/15/2009  . Smokeless tobacco: Never Used  . Alcohol Use: 0.5 oz/week    1 Standard drinks or equivalent per week  . Drug Use: No  . Sexual Activity: Not Asked   Other Topics Concern  . None   Social History Narrative    Review of Systems  Constitutional: Negative for fever, chills, appetite change, fatigue and unexpected weight change.  Eyes: Negative for visual disturbance.  Respiratory: Negative for cough and shortness of  breath.   Cardiovascular: Negative for chest pain and leg swelling.  Gastrointestinal: Negative for nausea, vomiting, abdominal pain, diarrhea and constipation.  Musculoskeletal: Negative for myalgias and arthralgias.  Skin: Negative for color change and rash.  Hematological: Negative for adenopathy. Does not bruise/bleed easily.  Psychiatric/Behavioral: Negative for suicidal ideas, sleep disturbance and dysphoric mood. The patient is not nervous/anxious.        Objective:    BP 142/82 mmHg  Pulse 70  Ht 5' (1.524 m)  Wt 152 lb 12.8 oz (69.31 kg)  BMI 29.84 kg/m2  SpO2 98% Physical Exam  Constitutional: She is oriented to person, place, and time. She appears well-developed and well-nourished. No distress.  HENT:  Head: Normocephalic and atraumatic.  Right Ear: External ear normal.  Left Ear: External ear normal.  Nose: Nose normal.  Mouth/Throat: Oropharynx is clear and moist. No oropharyngeal exudate.  Eyes: Conjunctivae are normal. Pupils are equal, round, and reactive to light. Right eye exhibits no discharge. Left eye exhibits no discharge. No scleral icterus.  Neck: Normal range of motion. Neck supple. No tracheal deviation present. No thyromegaly present.  Cardiovascular: Normal rate, regular rhythm, normal heart sounds and intact distal pulses.  Exam reveals no gallop and no friction rub.  No murmur heard. Pulmonary/Chest: Effort normal and breath sounds normal. No respiratory distress. She has no wheezes. She has no rales. She exhibits no tenderness.  Musculoskeletal: Normal range of motion. She exhibits no edema or tenderness.  Lymphadenopathy:    She has no cervical adenopathy.  Neurological: She is alert and oriented to person, place, and time. No cranial nerve deficit. She exhibits normal muscle tone. Coordination normal.  Skin: Skin is warm and dry. No rash noted. She is not diaphoretic. No erythema. No pallor.  Psychiatric: She has a normal mood and affect. Her  behavior is normal. Judgment and thought content normal.          Assessment & Plan:   Problem List Items Addressed This Visit      Unprioritized   CAD, AUTOLOGOUS BYPASS GRAFT    Symptomatically doing well. Will continue current medications.      Chronic back pain   Essential hypertension, benign - Primary    BP Readings from Last 3 Encounters:  10/03/15 142/82  06/12/15 120/78  01/22/15 120/64   BP well controlled. Renal function normal. Continue current medications.      Hypercalcemia    Recent calcium was mildly elevated. She has h/p primary hyperparathyroidism. Will check Ca and PTH with labs.      Relevant Orders   PTH, Intact and Calcium   Hyperlipidemia    Recent lipids well controlled. Continue Rosuvastatin.      Need for hepatitis C screening test   Relevant Orders   Hepatitis C antibody   Osteoporosis    Will set up follow up bone density testing in 11/2015. Reviewed bone density from 2015 with pt which showed improvement in bone density.      Relevant Orders   DG Bone Density   Weight gain    Wt Readings from Last 3 Encounters:  10/03/15 152 lb 12.8 oz (69.31 kg)  06/12/15 149 lb 8 oz (67.813 kg)  01/22/15 144 lb 12 oz (65.658 kg)   Encouraged healthy diet and exercise.          Return in about 6 months (around 04/03/2016) for Recheck.  Ronette Deter, MD Internal [facility]

## 2015-10-03 NOTE — Assessment & Plan Note (Signed)
Symptomatically doing well. Will continue current medications.

## 2015-10-03 NOTE — Progress Notes (Signed)
Pre visit review using our clinic review tool, if applicable. No additional management support is needed unless otherwise documented below in the visit note. 

## 2015-10-03 NOTE — Assessment & Plan Note (Signed)
BP Readings from Last 3 Encounters:  10/03/15 142/82  06/12/15 120/78  01/22/15 120/64   BP well controlled. Renal function normal. Continue current medications.

## 2015-10-03 NOTE — Telephone Encounter (Signed)
I spoke with pt and she is scheduled for 10/03 @ 8:30am to get her Physical.

## 2015-10-03 NOTE — Assessment & Plan Note (Signed)
Wt Readings from Last 3 Encounters:  10/03/15 152 lb 12.8 oz (69.31 kg)  06/12/15 149 lb 8 oz (67.813 kg)  01/22/15 144 lb 12 oz (65.658 kg)   Encouraged healthy diet and exercise.

## 2015-10-03 NOTE — Telephone Encounter (Signed)
Please advise for follow up?

## 2015-10-03 NOTE — Patient Instructions (Signed)
Labs today to check Calcium and PTH level.  We will also check Hep C.  Follow up in 6 months.

## 2015-10-03 NOTE — Telephone Encounter (Signed)
Fine for yearly visit

## 2015-10-03 NOTE — Telephone Encounter (Signed)
Ok. Let pt a msg to call me back. I'll keep trying to get the pt.

## 2015-10-03 NOTE — Telephone Encounter (Signed)
Patient was advised to be seen in 6 months in her check out note, she stated that she usually schedules with Dr. Gilford Rile yearly. She question if it would be nessasery to return in 6 months, she goes to her heart doctor regularly.

## 2015-10-03 NOTE — Assessment & Plan Note (Signed)
Recent lipids well controlled. Continue Rosuvastatin.

## 2015-10-03 NOTE — Assessment & Plan Note (Signed)
Will set up follow up bone density testing in 11/2015. Reviewed bone density from 2015 with pt which showed improvement in bone density.

## 2015-10-03 NOTE — Telephone Encounter (Signed)
Fine for yearly per Dr. Gilford Rile

## 2015-10-04 ENCOUNTER — Encounter: Payer: Self-pay | Admitting: Internal Medicine

## 2015-10-04 LAB — PTH, INTACT AND CALCIUM
CALCIUM: 10.6 mg/dL — AB (ref 8.4–10.5)
PTH: 68 pg/mL — AB (ref 14–64)

## 2015-10-04 LAB — HEPATITIS C ANTIBODY: HCV Ab: NEGATIVE

## 2015-10-10 ENCOUNTER — Encounter: Payer: Self-pay | Admitting: Internal Medicine

## 2015-10-30 DIAGNOSIS — H2511 Age-related nuclear cataract, right eye: Secondary | ICD-10-CM | POA: Diagnosis not present

## 2015-10-30 DIAGNOSIS — H25011 Cortical age-related cataract, right eye: Secondary | ICD-10-CM | POA: Diagnosis not present

## 2015-10-31 ENCOUNTER — Encounter (INDEPENDENT_AMBULATORY_CARE_PROVIDER_SITE_OTHER): Payer: Medicare HMO | Admitting: Ophthalmology

## 2015-10-31 DIAGNOSIS — I10 Essential (primary) hypertension: Secondary | ICD-10-CM

## 2015-10-31 DIAGNOSIS — H2511 Age-related nuclear cataract, right eye: Secondary | ICD-10-CM

## 2015-10-31 DIAGNOSIS — H353221 Exudative age-related macular degeneration, left eye, with active choroidal neovascularization: Secondary | ICD-10-CM | POA: Diagnosis not present

## 2015-10-31 DIAGNOSIS — H35033 Hypertensive retinopathy, bilateral: Secondary | ICD-10-CM

## 2015-10-31 DIAGNOSIS — H43813 Vitreous degeneration, bilateral: Secondary | ICD-10-CM | POA: Diagnosis not present

## 2015-10-31 DIAGNOSIS — H353112 Nonexudative age-related macular degeneration, right eye, intermediate dry stage: Secondary | ICD-10-CM | POA: Diagnosis not present

## 2015-11-05 DIAGNOSIS — H2511 Age-related nuclear cataract, right eye: Secondary | ICD-10-CM | POA: Diagnosis not present

## 2015-11-05 DIAGNOSIS — H25811 Combined forms of age-related cataract, right eye: Secondary | ICD-10-CM | POA: Diagnosis not present

## 2015-11-08 ENCOUNTER — Encounter: Payer: Self-pay | Admitting: Internal Medicine

## 2015-11-29 ENCOUNTER — Encounter: Payer: Self-pay | Admitting: Family

## 2015-12-11 ENCOUNTER — Other Ambulatory Visit: Payer: Self-pay | Admitting: Cardiovascular Disease

## 2015-12-19 ENCOUNTER — Ambulatory Visit (INDEPENDENT_AMBULATORY_CARE_PROVIDER_SITE_OTHER): Payer: Medicare HMO | Admitting: Cardiovascular Disease

## 2015-12-19 ENCOUNTER — Encounter: Payer: Self-pay | Admitting: Cardiovascular Disease

## 2015-12-19 ENCOUNTER — Encounter (INDEPENDENT_AMBULATORY_CARE_PROVIDER_SITE_OTHER): Payer: Medicare HMO | Admitting: Ophthalmology

## 2015-12-19 VITALS — BP 134/74 | HR 80 | Ht 60.0 in | Wt 154.0 lb

## 2015-12-19 DIAGNOSIS — I25810 Atherosclerosis of coronary artery bypass graft(s) without angina pectoris: Secondary | ICD-10-CM

## 2015-12-19 DIAGNOSIS — E785 Hyperlipidemia, unspecified: Secondary | ICD-10-CM | POA: Diagnosis not present

## 2015-12-19 DIAGNOSIS — I10 Essential (primary) hypertension: Secondary | ICD-10-CM

## 2015-12-19 DIAGNOSIS — H43813 Vitreous degeneration, bilateral: Secondary | ICD-10-CM

## 2015-12-19 DIAGNOSIS — H35033 Hypertensive retinopathy, bilateral: Secondary | ICD-10-CM

## 2015-12-19 DIAGNOSIS — H353112 Nonexudative age-related macular degeneration, right eye, intermediate dry stage: Secondary | ICD-10-CM | POA: Diagnosis not present

## 2015-12-19 DIAGNOSIS — I739 Peripheral vascular disease, unspecified: Secondary | ICD-10-CM

## 2015-12-19 DIAGNOSIS — Z87891 Personal history of nicotine dependence: Secondary | ICD-10-CM

## 2015-12-19 DIAGNOSIS — E669 Obesity, unspecified: Secondary | ICD-10-CM | POA: Insufficient documentation

## 2015-12-19 DIAGNOSIS — H353221 Exudative age-related macular degeneration, left eye, with active choroidal neovascularization: Secondary | ICD-10-CM

## 2015-12-19 NOTE — Progress Notes (Signed)
Cardiology Office Note  Date:  12/19/2015   ID:  [name], DOB [birth year], MRN [id number]  PCP:  [name], FNP   Chief Complaint  Patient presents with  . Other    6 month f/u. Meds reviewed verbally with pt.    HPI:  [name] is a pleasant 70 year old woman with a history of coronary artery disease, stent to her left circumflex and 2 stents to the LAD in March of 2011, aorto femoral bypass grafting in early October 2011,  with occlusion of her coronary stents in mid-October requiring bypass surgery at [facility] in mid to late October 2012.  Cath 2013: occluded SVG to PDA She presents for routine followup of her coronary artery disease.  History of thyroid surgery March 2014  long history of smoking though stopped in March of 2011  plastic surgery on her face in the past  In follow-up, she reports that she is doing well Denies any significant chest pain Lab work reviewed with her in detail Total chol 148, LDL 62,  HBA1C 5.7 She is surprised lab work looks good as she is eating poorly, weight has been trending upwards  Reports that she has Macular degeneration, getting eye shots Had cataract surg, now eyes are doing well  epsiodes of back pain, takes pain med prn  no regular exercise  Previously worked with a nutritionist, was buying products from [employer]. Wonders if she should retry this regimen  EKG on today's visit shows normal sinus rhythm with rate 80 bpm, Nonspecific ST abnormality anterolateral leads  Other past medical history  cardiac catheterization showed LIMA to the LAD, vein graft to the left circumflex, vein graft to the PDA which is occluded that supplies a very small area. Left main has 50% disease, ostial LAD has 95% in-stent restenosis, mid LAD has 100% disease, 75% ostial circumflex, 100% mid circumflex, very small OM1, 30% RCA disease, ejection fraction 45%  Stress test shows suboptimal images due to GI activity on resting images, significant  anterior wall ischemia in the mid to distal segments, reduced ejection fraction of 47%     Prior ventricular arrhythmia when her stents were placed in the Cath Lab requiring shock.    echocardiogram improved, EF  greater than 40%.   Cardiac catheter report from July 15 2009  indicate she had a vision bare metal stent 2.75 x 28 mm in the LCX, mini vision bare metal stent 2.5 x 28 and 2.5 x 15 mm stent to the LAD.   DJD in her neck and back  PMH:   has a past medical history of Coronary artery disease (2011); Hyperlipidemia; Hypertension; and Macular degeneration.  PSH:    Past Surgical History:  Procedure Laterality Date  . CARDIAC CATHETERIZATION  2013   [facility]  . CATARACT EXTRACTION    . CORONARY ARTERY BYPASS GRAFT  02/07/2010   x 3  . FACIAL COSMETIC SURGERY  2013   Dr. Nadeen Landau  . FOOT SURGERY     right  . PARATHYROIDECTOMY      Current Outpatient Prescriptions  Medication Sig Dispense Refill  . ALPRAZolam (XANAX) 0.5 MG tablet Take 1 tablet (0.5 mg total) by mouth at bedtime. 30 tablet 4  . aspirin 81 MG EC tablet Take 81 mg by mouth daily.      . Cholecalciferol (VITAMIN D3) 1000 UNITS CAPS Take 1,000 Units by mouth 1 day or 1 dose.      . Coenzyme Q10 100 MG capsule Take 100  mg by mouth daily.     Marland Kitchen HYDROcodone-acetaminophen (NORCO) 7.5-325 MG per tablet Take 1 tablet by mouth every 6 (six) hours as needed for moderate pain.    Marland Kitchen KRILL OIL PO Take 350 mg by mouth daily.    . metoprolol tartrate (LOPRESSOR) 25 MG tablet TAKE 1/2 TABLET BY MOUTH TWICE DAILY 30 tablet 0  . Multiple Vitamins-Minerals (PRESERVISION AREDS 2 PO) Take by mouth.    . pantoprazole (PROTONIX) 40 MG tablet TAKE ONE (1) TABLET BY MOUTH EVERY DAY 90 tablet 3  . Probiotic Product (PROBIOTIC FORMULA) CAPS Take by mouth daily.    . rosuvastatin (CRESTOR) 40 MG tablet TAKE ONE (1) TABLET BY MOUTH EVERY DAY 90 tablet 3  . Turmeric Curcumin 500 MG CAPS Take by mouth daily.     No current  facility-administered medications for this visit.      Allergies:   Review of patient's allergies indicates no known allergies.   Social History:  The patient  reports that she quit smoking about 6 years ago. Her smoking use included Cigarettes. She has a 40.00 pack-year smoking history. She has never used smokeless tobacco. She reports that she drinks about 0.5 oz of alcohol per week . She reports that she does not use drugs.   Family History:   Family history is unknown by patient.    Review of Systems: Review of Systems  Constitutional: Negative.   Respiratory: Negative.   Cardiovascular: Negative.   Gastrointestinal: Negative.   Musculoskeletal: Negative.   Neurological: Negative.   Psychiatric/Behavioral: Negative.   All other systems reviewed and are negative.    PHYSICAL EXAM: VS:  BP 134/74 (BP Location: Left Arm, Patient Position: Sitting, Cuff Size: Normal)   Pulse 80   Ht 5' (1.524 m)   Wt 154 lb (69.9 kg)   BMI 30.08 kg/m  , BMI Body mass index is 30.08 kg/m. GEN: Well nourished, well developed, in no acute distress , obese HEENT: normal  Neck: no JVD, carotid bruits, or masses Cardiac: RRR; no murmurs, rubs, or gallops,no edema  Respiratory:  clear to auscultation bilaterally, normal work of breathing GI: soft, nontender, nondistended, + BS MS: no deformity or atrophy  Skin: warm and dry, no rash Neuro:  Strength and sensation are intact Psych: euthymic mood, full affect   Recent Labs: 09/26/2015: ALT 26; BUN 18; Creatinine, Ser 0.76; Platelets 241; Potassium 4.6; Sodium 138; TSH 3.170    Lipid Panel Lab Results  Component Value Date   CHOL 148 09/26/2015   HDL 74 09/26/2015   LDLCALC 62 09/26/2015   TRIG 59 09/26/2015      Wt Readings from Last 3 Encounters:  12/19/15 154 lb (69.9 kg)  10/03/15 152 lb 12.8 oz (69.3 kg)  06/12/15 149 lb 8 oz (67.8 kg)       ASSESSMENT AND PLAN:  Hyperlipidemia Cholesterol is at goal on the current lipid  regimen. No changes to the medications were made. Recommended weight loss  Coronary atherosclerosis of autologous vein bypass graft without angina Currently with no symptoms of angina. No further workup at this time. Continue current medication regimen.  PVD (peripheral vascular disease) ([place]) S/p Lower extremity bypass grafting We will recommend routine lower extremity arterial Doppler in follow-up  Essential hypertension, benign Blood pressure is well controlled on today's visit. No changes made to the medications.  TOBACCO USE, QUIT Reports she does not need Chantix, stopped smoking several years ago  Obesity Long discussion concerning her diet Recommended she  retry her shakes Needs to modify her diet, less carbohydrates Start exercise program   Total encounter time more than 25 minutes  Greater than 50% was spent in counseling and coordination of care with the patient   Disposition:   F/U  6 months  No orders of the defined types were placed in this encounter.    Signed, Esmond Plants, M.D., Ph.D. 12/19/2015  [place], [place]

## 2015-12-19 NOTE — Patient Instructions (Signed)

## 2015-12-24 ENCOUNTER — Encounter: Payer: Self-pay | Admitting: Family

## 2015-12-24 NOTE — Addendum Note (Signed)
Addended by: Britt Bottom on: 12/24/2015 07:35 AM   Modules accepted: Orders

## 2015-12-26 ENCOUNTER — Ambulatory Visit: Payer: Medicare HMO

## 2016-01-21 ENCOUNTER — Encounter: Payer: Medicare HMO | Admitting: Internal Medicine

## 2016-01-21 ENCOUNTER — Ambulatory Visit (INDEPENDENT_AMBULATORY_CARE_PROVIDER_SITE_OTHER): Payer: Medicare HMO | Admitting: Family

## 2016-01-21 ENCOUNTER — Other Ambulatory Visit (HOSPITAL_COMMUNITY)
Admission: RE | Admit: 2016-01-21 | Discharge: 2016-01-21 | Disposition: A | Discharge status: Home / Self Care | Payer: Medicare HMO | Source: Ambulatory Visit | Attending: Family | Admitting: Family

## 2016-01-21 ENCOUNTER — Encounter: Payer: Self-pay | Admitting: Family

## 2016-01-21 VITALS — BP 140/72 | HR 73 | Temp 98.1°F | Ht 60.0 in | Wt 157.0 lb

## 2016-01-21 DIAGNOSIS — Z1151 Encounter for screening for human papillomavirus (HPV): Secondary | ICD-10-CM | POA: Insufficient documentation

## 2016-01-21 DIAGNOSIS — Z0001 Encounter for general adult medical examination with abnormal findings: Secondary | ICD-10-CM

## 2016-01-21 DIAGNOSIS — R140 Abdominal distension (gaseous): Secondary | ICD-10-CM | POA: Diagnosis not present

## 2016-01-21 DIAGNOSIS — F411 Generalized anxiety disorder: Secondary | ICD-10-CM

## 2016-01-21 DIAGNOSIS — Z01411 Encounter for gynecological examination (general) (routine) with abnormal findings: Secondary | ICD-10-CM | POA: Insufficient documentation

## 2016-01-21 DIAGNOSIS — E213 Hyperparathyroidism, unspecified: Secondary | ICD-10-CM

## 2016-01-21 DIAGNOSIS — F32A Depression, unspecified: Secondary | ICD-10-CM | POA: Insufficient documentation

## 2016-01-21 DIAGNOSIS — E210 Primary hyperparathyroidism: Secondary | ICD-10-CM | POA: Diagnosis not present

## 2016-01-21 DIAGNOSIS — R69 Illness, unspecified: Secondary | ICD-10-CM | POA: Diagnosis not present

## 2016-01-21 DIAGNOSIS — R635 Abnormal weight gain: Secondary | ICD-10-CM

## 2016-01-21 DIAGNOSIS — I10 Essential (primary) hypertension: Secondary | ICD-10-CM

## 2016-01-21 DIAGNOSIS — R87610 Atypical squamous cells of undetermined significance on cytologic smear of cervix (ASC-US): Secondary | ICD-10-CM | POA: Diagnosis not present

## 2016-01-21 DIAGNOSIS — F419 Anxiety disorder, unspecified: Secondary | ICD-10-CM | POA: Insufficient documentation

## 2016-01-21 DIAGNOSIS — K219 Gastro-esophageal reflux disease without esophagitis: Secondary | ICD-10-CM

## 2016-01-21 DIAGNOSIS — Z1239 Encounter for other screening for malignant neoplasm of breast: Secondary | ICD-10-CM | POA: Diagnosis not present

## 2016-01-21 MED ORDER — SERTRALINE HCL 50 MG PO TABS: 50.0000 mg | ORAL_TABLET | Freq: Every day | ORAL | 0 refills | Status: DC

## 2016-01-21 NOTE — Patient Instructions (Signed)
Today you may start alternating between one whole tablet ( 0.5 mg) xanax at bedtime and 1/2 tablet  (0.25 mg) xanax one week.  The following week, take 1/2 tablet (0.25 mg) of xanax at bedtime.   The following week take 1/2 tablet (0.25 mg) of xanax every other day.  The following week take 1/2 tablet (0.25 mg) of xanax every third day.   At this point as long and as you feel okay, you may stop the medication. Please give [place] a call during this titration to let us know how you're doing.  Trial of Zoloft.   Health Maintenance, Female Adopting a healthy lifestyle and getting preventive care can go a long way to promote health and wellness. Talk with your health care provider about what schedule of regular examinations is right for you. This is a good chance for you to check in with your provider about disease prevention and staying healthy. In between checkups, there are plenty of things you can do on your own. Experts have done a lot of research about which lifestyle changes and preventive measures are most likely to keep you healthy. Ask your health care provider for more information. WEIGHT AND DIET  Eat a healthy diet  Be sure to include plenty of vegetables, fruits, low-fat dairy products, and lean protein.  Do not eat a lot of foods high in solid fats, added sugars, or salt.  Get regular exercise. This is one of the most important things you can do for your health.  Most adults should exercise for at least 150 minutes each week. The exercise should increase your heart rate and make you sweat (moderate-intensity exercise).  Most adults should also do strengthening exercises at least twice a week. This is in addition to the moderate-intensity exercise.  Maintain a healthy weight  Body mass index (BMI) is a measurement that can be used to identify possible weight problems. It estimates body fat based on height and weight. Your health care provider can help determine your BMI and  help you achieve or maintain a healthy weight.  For females 38 years of age and older:   A BMI below 18.5 is considered underweight.  A BMI of 18.5 to 24.9 is normal.  A BMI of 25 to 29.9 is considered overweight.  A BMI of 30 and above is considered obese.  Watch levels of cholesterol and blood lipids  You should start having your blood tested for lipids and cholesterol at 69 years of age, then have this test every 5 years.  You may need to have your cholesterol levels checked more often if:  Your lipid or cholesterol levels are high.  You are older than 68 years of age.  You are at high risk for heart disease.  CANCER SCREENING   Lung Cancer  Lung cancer screening is recommended for adults 9-53 years old who are at high risk for lung cancer because of a history of smoking.  A yearly low-dose CT scan of the lungs is recommended for people who:  Currently smoke.  Have quit within the past 15 years.  Have at least a 30-pack-year history of smoking. A pack year is smoking an average of one pack of cigarettes a day for 1 year.  Yearly screening should continue until it has been 15 years since you quit.  Yearly screening should stop if you develop a health problem that would prevent you from having lung cancer treatment.  Breast Cancer  Practice breast self-awareness. This means  understanding how your breasts normally appear and feel.  It also means doing regular breast self-exams. Let your health care provider know about any changes, no matter how small.  If you are in your 20s or 30s, you should have a clinical breast exam (CBE) by a health care provider every 1-3 years as part of a regular health exam.  If you are 2 or older, have a CBE every year. Also consider having a breast X-ray (mammogram) every year.  If you have a family history of breast cancer, talk to your health care provider about genetic screening.  If you are at high risk for breast cancer, talk  to your health care provider about having an MRI and a mammogram every year.  Breast cancer gene (BRCA) assessment is recommended for women who have family members with BRCA-related cancers. BRCA-related cancers include:  Breast.  Ovarian.  Tubal.  Peritoneal cancers.  Results of the assessment will determine the need for genetic counseling and BRCA1 and BRCA2 testing. Cervical Cancer Your health care provider may recommend that you be screened regularly for cancer of the pelvic organs (ovaries, uterus, and vagina). This screening involves a pelvic examination, including checking for microscopic changes to the surface of your cervix (Pap test). You may be encouraged to have this screening done every 3 years, beginning at age 71.  For women ages 17-65, health care providers may recommend pelvic exams and Pap testing every 3 years, or they may recommend the Pap and pelvic exam, combined with testing for human papilloma virus (HPV), every 5 years. Some types of HPV increase your risk of cervical cancer. Testing for HPV may also be done on women of any age with unclear Pap test results.  Other health care providers may not recommend any screening for nonpregnant women who are considered low risk for pelvic cancer and who do not have symptoms. Ask your health care provider if a screening pelvic exam is right for you.  If you have had past treatment for cervical cancer or a condition that could lead to cancer, you need Pap tests and screening for cancer for at least 20 years after your treatment. If Pap tests have been discontinued, your risk factors (such as having a new sexual partner) need to be reassessed to determine if screening should resume. Some women have medical problems that increase the chance of getting cervical cancer. In these cases, your health care provider may recommend more frequent screening and Pap tests. Colorectal Cancer  This type of cancer can be detected and often  prevented.  Routine colorectal cancer screening usually begins at 68 years of age and continues through 68 years of age.  Your health care provider may recommend screening at an earlier age if you have risk factors for colon cancer.  Your health care provider may also recommend using home test kits to check for hidden blood in the stool.  A small camera at the end of a tube can be used to examine your colon directly (sigmoidoscopy or colonoscopy). This is done to check for the earliest forms of colorectal cancer.  Routine screening usually begins at age 66.  Direct examination of the colon should be repeated every 5-10 years through 68 years of age. However, you may need to be screened more often if early forms of precancerous polyps or small growths are found. Skin Cancer  Check your skin from head to toe regularly.  Tell your health care provider about any new moles or changes in  moles, especially if there is a change in a mole's shape or color.  Also tell your health care provider if you have a mole that is larger than the size of a pencil eraser.  Always use sunscreen. Apply sunscreen liberally and repeatedly throughout the day.  Protect yourself by wearing long sleeves, pants, a wide-brimmed hat, and sunglasses whenever you are outside. HEART DISEASE, DIABETES, AND HIGH BLOOD PRESSURE   High blood pressure causes heart disease and increases the risk of stroke. High blood pressure is more likely to develop in:  People who have blood pressure in the high end of the normal range (130-139/85-89 mm Hg).  People who are overweight or obese.  People who are African American.  If you are 43-41 years of age, have your blood pressure checked every 3-5 years. If you are 35 years of age or older, have your blood pressure checked every year. You should have your blood pressure measured twice--once when you are at a hospital or clinic, and once when you are not at a hospital or clinic.  Record the average of the two measurements. To check your blood pressure when you are not at a hospital or clinic, you can use:  An automated blood pressure machine at a pharmacy.  A home blood pressure monitor.  If you are between 64 years and 4 years old, ask your health care provider if you should take aspirin to prevent strokes.  Have regular diabetes screenings. This involves taking a blood sample to check your fasting blood sugar level.  If you are at a normal weight and have a low risk for diabetes, have this test once every three years after 68 years of age.  If you are overweight and have a high risk for diabetes, consider being tested at a younger age or more often. PREVENTING INFECTION  Hepatitis B  If you have a higher risk for hepatitis B, you should be screened for this virus. You are considered at high risk for hepatitis B if:  You were born in a country where hepatitis B is common. Ask your health care provider which countries are considered high risk.  Your parents were born in a high-risk country, and you have not been immunized against hepatitis B (hepatitis B vaccine).  You have HIV or AIDS.  You use needles to inject street drugs.  You live with someone who has hepatitis B.  You have had sex with someone who has hepatitis B.  You get hemodialysis treatment.  You take certain medicines for conditions, including cancer, organ transplantation, and autoimmune conditions. Hepatitis C  Blood testing is recommended for:  Everyone born from [birth year] through 1963.  Anyone with known risk factors for hepatitis C. Sexually transmitted infections (STIs)  You should be screened for sexually transmitted infections (STIs) including gonorrhea and chlamydia if:  You are sexually active and are younger than 68 years of age.  You are older than 68 years of age and your health care provider tells you that you are at risk for this type of infection.  Your sexual activity  has changed since you were last screened and you are at an increased risk for chlamydia or gonorrhea. Ask your health care provider if you are at risk.  If you do not have HIV, but are at risk, it may be recommended that you take a prescription medicine daily to prevent HIV infection. This is called pre-exposure prophylaxis (PrEP). You are considered at risk if:  You are sexually  active and do not regularly use condoms or know the HIV status of your partner(s).  You take drugs by injection.  You are sexually active with a partner who has HIV. Talk with your health care provider about whether you are at high risk of being infected with HIV. If you choose to begin PrEP, you should first be tested for HIV. You should then be tested every 3 months for as long as you are taking PrEP.  PREGNANCY   If you are premenopausal and you may become pregnant, ask your health care provider about preconception counseling.  If you may become pregnant, take 400 to 800 micrograms (mcg) of folic acid every day.  If you want to prevent pregnancy, talk to your health care provider about birth control (contraception). OSTEOPOROSIS AND MENOPAUSE   Osteoporosis is a disease in which the bones lose minerals and strength with aging. This can result in serious bone fractures. Your risk for osteoporosis can be identified using a bone density scan.  If you are 102 years of age or older, or if you are at risk for osteoporosis and fractures, ask your health care provider if you should be screened.  Ask your health care provider whether you should take a calcium or vitamin D supplement to lower your risk for osteoporosis.  Menopause may have certain physical symptoms and risks.  Hormone replacement therapy may reduce some of these symptoms and risks. Talk to your health care provider about whether hormone replacement therapy is right for you.  HOME CARE INSTRUCTIONS   Schedule regular health, dental, and eye  exams.  Stay current with your immunizations.   Do not use any tobacco products including cigarettes, chewing tobacco, or electronic cigarettes.  If you are pregnant, do not drink alcohol.  If you are breastfeeding, limit how much and how often you drink alcohol.  Limit alcohol intake to no more than 1 drink per day for nonpregnant women. One drink equals 12 ounces of beer, 5 ounces of wine, or 1 ounces of hard liquor.  Do not use street drugs.  Do not share needles.  Ask your health care provider for help if you need support or information about quitting drugs.  Tell your health care provider if you often feel depressed.  Tell your health care provider if you have ever been abused or do not feel safe at home.   This information is not intended to replace advice given to you by your health care provider. Make sure you discuss any questions you have with your health care provider.   Document Released: 10/20/2010 Document Revised: 04/27/2014 Document Reviewed: 03/08/2013 Elsevier Interactive Patient Education [employer].

## 2016-01-21 NOTE — Assessment & Plan Note (Signed)
Patient remains frustrated by weight.  I encouraged her to exercise more than twice daily as she is currently. Ideally most days out of the week for 20-30 minutes per day.

## 2016-01-21 NOTE — Assessment & Plan Note (Signed)
She had no abdominal tenderness on exam, no cervical motion tenderness. She does complain of overall abdominal distention for a long time; we jointly agreed for imaging to ensure no underlying malignancy.

## 2016-01-21 NOTE — Assessment & Plan Note (Signed)
Trial of zoloft for anxiety and sleep. Discussed risks of BZDs and patient agreed with long term plan of decreasing/discontinuing. F/u 6 weeks.

## 2016-01-21 NOTE — Progress Notes (Signed)
Subjective:    Patient ID: [name], female    DOB: 1945/06/17, 70 y.o.   MRN: [id number]  CC: [name] is a 70 y.o. female who presents today for physical exam.    HPI: Patient here for CPE.    Continues to be frustrated by weight.   Anxiety- Uses Xanax nightly at bedtime for sleep. No depression.   GERD- Controlled on protonix. Controlled. No dysphagia, no unintentional weight loss.   HTN and h/o MI, s/p CABG at [facility] 2013- on metoprolol, and aspirin, Follows with Dr. Rockey Situ.   HLD - Compliant on Crestor  Chronic LBP- Dr.Voytek who prescribes norco       Colorectal Cancer Screening: UTD  Breast Cancer Screening: Mammogram scheduled 02/2016 Cervical Cancer Screening: 2014, normal.  Bone Health screening/DEXA for 65+: No increased fracture risk. Pending,ordered.  Immunizations       Tetanus - UTD        Pneumococcal - Done Labs: Screening labs today. Exercise: Gets regular exercise.  Alcohol use: Occasional.   HISTORY:  Past Medical History:  Diagnosis Date  . Coronary artery disease 2011   CABG x 3  . Hyperlipidemia   . Hypertension   . Macular degeneration   . MI (myocardial infarction)     Past Surgical History:  Procedure Laterality Date  . CARDIAC CATHETERIZATION  2013   [facility]  . CATARACT EXTRACTION    . CORONARY ARTERY BYPASS GRAFT  02/07/2010   x 3  . FACIAL COSMETIC SURGERY  2013   Dr. Nadeen Landau  . FOOT SURGERY     right  . PARATHYROIDECTOMY     Family History  Problem Relation Age of Onset  . Family history unknown: Yes      ALLERGIES: Review of patient's allergies indicates no known allergies.  Current Outpatient Prescriptions on File Prior to Visit  Medication Sig Dispense Refill  . ALPRAZolam (XANAX) 0.5 MG tablet Take 1 tablet (0.5 mg total) by mouth at bedtime. 30 tablet 4  . aspirin 81 MG EC tablet Take 81 mg by mouth daily.      . Cholecalciferol (VITAMIN D3) 1000 UNITS CAPS Take 1,000 Units by mouth 1 day or 1 dose.       . Coenzyme Q10 100 MG capsule Take 100 mg by mouth daily.     Marland Kitchen HYDROcodone-acetaminophen (NORCO) 7.5-325 MG per tablet Take 1 tablet by mouth every 6 (six) hours as needed for moderate pain.    Marland Kitchen KRILL OIL PO Take 350 mg by mouth daily.    . metoprolol tartrate (LOPRESSOR) 25 MG tablet TAKE 1/2 TABLET BY MOUTH TWICE DAILY 30 tablet 0  . Multiple Vitamins-Minerals (PRESERVISION AREDS 2 PO) Take by mouth.    . pantoprazole (PROTONIX) 40 MG tablet TAKE ONE (1) TABLET BY MOUTH EVERY DAY 90 tablet 3  . Probiotic Product (PROBIOTIC FORMULA) CAPS Take by mouth daily.    . rosuvastatin (CRESTOR) 40 MG tablet TAKE ONE (1) TABLET BY MOUTH EVERY DAY 90 tablet 3  . Turmeric Curcumin 500 MG CAPS Take by mouth daily.     No current facility-administered medications on file prior to visit.     Social History  Substance Use Topics  . Smoking status: Former Smoker    Packs/day: 1.00    Years: 40.00    Types: Cigarettes    Quit date: 07/15/2009  . Smokeless tobacco: Never Used  . Alcohol use 0.5 oz/week    1 Standard drinks or equivalent  per week    Review of Systems  Constitutional: Negative for chills, fever and unexpected weight change.  HENT: Negative for congestion.   Respiratory: Negative for cough.   Cardiovascular: Negative for chest pain, palpitations and leg swelling.  Gastrointestinal: Negative for nausea and vomiting.  Musculoskeletal: Negative for arthralgias and myalgias.  Skin: Negative for rash.  Neurological: Negative for headaches.  Hematological: Negative for adenopathy.  Psychiatric/Behavioral: Negative for confusion.      Objective:    BP 140/72   Pulse 73   Temp 98.1 F (36.7 C) (Oral)   Ht 5' (1.524 m)   Wt 157 lb (71.2 kg)   SpO2 97%   BMI 30.66 kg/m   BP Readings from Last 3 Encounters:  01/21/16 140/72  12/19/15 134/74  10/03/15 (!) 142/82   Wt Readings from Last 3 Encounters:  01/21/16 157 lb (71.2 kg)  12/19/15 154 lb (69.9 kg)  10/03/15 152  lb 12.8 oz (69.3 kg)    Physical Exam  Constitutional: She appears well-developed and well-nourished.  Eyes: Conjunctivae are normal.  Neck: No thyroid mass and no thyromegaly present.  Cardiovascular: Normal rate, regular rhythm, normal heart sounds and normal pulses.   Pulmonary/Chest: Effort normal and breath sounds normal. She has no wheezes. She has no rhonchi. She has no rales. Right breast exhibits no inverted nipple, no mass, no nipple discharge, no skin change and no tenderness. Left breast exhibits no inverted nipple, no mass, no nipple discharge, no skin change and no tenderness. Breasts are symmetrical.  No masses or asymmetry appreciated during CBE.  Abdominal: Soft. Normal appearance and bowel sounds are normal. She exhibits no distension, no fluid wave, no ascites and no mass. There is no tenderness. There is no rigidity, no rebound, no guarding and no CVA tenderness.  Obese.  Genitourinary: Uterus is not enlarged, not fixed and not tender. Cervix exhibits no motion tenderness, no discharge and no friability. Right adnexum displays no mass, no tenderness and no fullness. Left adnexum displays no mass, no tenderness and no fullness.  Genitourinary Comments: Pap performed. No CMT. Unable to appreciated ovaries.  Lymphadenopathy:       Head (right side): No submental, no submandibular, no tonsillar, no preauricular, no posterior auricular and no occipital adenopathy present.       Head (left side): No submental, no submandibular, no tonsillar, no preauricular, no posterior auricular and no occipital adenopathy present.       Right cervical: No superficial cervical, no deep cervical and no posterior cervical adenopathy present.      Left cervical: No superficial cervical, no deep cervical and no posterior cervical adenopathy present.    She has no axillary adenopathy.       Right axillary: No pectoral and no lateral adenopathy present.       Left axillary: No pectoral and no lateral  adenopathy present. Neurological: She is alert.  Skin: Skin is warm and dry.  Psychiatric: She has a normal mood and affect. Her speech is normal and behavior is normal. Thought content normal.  Vitals reviewed.      Assessment & Plan:   Problem List Items Addressed This Visit      Cardiovascular and Mediastinum   Essential hypertension, benign    Controlled on metoprolol. Takes ASA. H/o CABG. Follows with Dr. Rockey Situ.        Digestive   GERD (gastroesophageal reflux disease)    Stable. Controlled on current regimen.         Endocrine  Primary hyperparathyroidism ([place])    09/2015 Ca and PTH remain consistently elevated. Referral placed to ENT.         Other   Weight gain    Patient remains frustrated by weight.  I encouraged her to exercise more than twice daily as she is currently. Ideally most days out of the week for 20-30 minutes per day.      Encounter for routine adult medical exam with abnormal findings    Screening labs done 09/2015. Cholesterol and A1c at goal. TSH, kidney function, and electrolytes normal. Colonoscopy up-to-date. Mammogram scheduled. Pap smear and clinical breast exam performed today. She also is scheduled for dexa. Immunizations up-to-date. Encouraged exercise.      Generalized anxiety disorder    Trial of zoloft for anxiety and sleep. Discussed risks of BZDs and patient agreed with long term plan of decreasing/discontinuing. F/u 6 weeks.        Relevant Medications   sertraline (ZOLOFT) 50 MG tablet   Abdominal distension     She had no abdominal tenderness on exam, no cervical motion tenderness. She does complain of overall abdominal distention for a long time; we jointly agreed for imaging to ensure no underlying malignancy.       Other Visit Diagnoses    Encounter for routine adult physical exam with abnormal findings    -  Primary   Relevant Orders   Cytology - PAP   Encounter for breast cancer screening other than mammogram        Relevant Orders   MM Digital Screening   Abdominal distention       Relevant Orders   CT ABDOMEN PELVIS W CONTRAST   Hyperparathyroidism ([place])       Relevant Orders   Ambulatory referral to ENT       I am having Ms. Bonura start on sertraline. I am also having her maintain her aspirin, Vitamin D3, PROBIOTIC FORMULA, Coenzyme Q10, HYDROcodone-acetaminophen, Turmeric Curcumin, KRILL OIL PO, Multiple Vitamins-Minerals (PRESERVISION AREDS 2 PO), rosuvastatin, pantoprazole, ALPRAZolam, metoprolol tartrate, and ergocalciferol.   Meds ordered this encounter  Medications  . ergocalciferol (DRISDOL) 50000 units capsule    Sig: Take by mouth.  . sertraline (ZOLOFT) 50 MG tablet    Sig: Take 1 tablet (50 mg total) by mouth at bedtime.    Dispense:  90 tablet    Refill:  0    Order Specific Question:   Supervising Provider    Answer:   Crecencio Mc [[id number]]    Return precautions given.   Risks, benefits, and alternatives of the medications and treatment plan prescribed today were discussed, and patient expressed understanding.   Education regarding symptom management and diagnosis given to patient on AVS.   Continue to follow with [name], FNP for routine health maintenance.   Beverly Gust Sherrod and I agreed with plan.   [name], FNP

## 2016-01-21 NOTE — Assessment & Plan Note (Addendum)
Screening labs done 09/2015. Cholesterol and A1c at goal. TSH, kidney function, and electrolytes normal. Colonoscopy up-to-date. Mammogram scheduled. Pap smear and clinical breast exam performed today. She also is scheduled for dexa. Immunizations up-to-date. Encouraged exercise.

## 2016-01-21 NOTE — Assessment & Plan Note (Signed)
Stable. Controlled on current regimen.

## 2016-01-21 NOTE — Assessment & Plan Note (Signed)
Controlled on metoprolol. Takes ASA. H/o CABG. Follows with Dr. Rockey Situ.

## 2016-01-21 NOTE — Progress Notes (Signed)
Pre visit review using our clinic review tool, if applicable. No additional management support is needed unless otherwise documented below in the visit note. 

## 2016-01-21 NOTE — Assessment & Plan Note (Addendum)
09/2015 Ca and PTH remain consistently elevated. Referral placed to ENT.

## 2016-01-22 LAB — CYTOLOGY - PAP

## 2016-01-23 ENCOUNTER — Encounter: Payer: Self-pay | Admitting: Family

## 2016-01-23 ENCOUNTER — Other Ambulatory Visit: Payer: Self-pay | Admitting: Family

## 2016-01-23 DIAGNOSIS — M19042 Primary osteoarthritis, left hand: Secondary | ICD-10-CM | POA: Diagnosis not present

## 2016-01-23 DIAGNOSIS — R87610 Atypical squamous cells of undetermined significance on cytologic smear of cervix (ASC-US): Secondary | ICD-10-CM

## 2016-01-27 NOTE — Telephone Encounter (Signed)
CT has been cancelled and she would like her referral to ENT cancelled as well.

## 2016-01-31 ENCOUNTER — Ambulatory Visit: Payer: Medicare HMO

## 2016-02-06 DIAGNOSIS — S60212A Contusion of left wrist, initial encounter: Secondary | ICD-10-CM | POA: Diagnosis not present

## 2016-02-08 ENCOUNTER — Encounter: Payer: Self-pay | Admitting: Family

## 2016-02-13 ENCOUNTER — Encounter (INDEPENDENT_AMBULATORY_CARE_PROVIDER_SITE_OTHER): Payer: Medicare HMO | Admitting: Ophthalmology

## 2016-02-13 DIAGNOSIS — H35033 Hypertensive retinopathy, bilateral: Secondary | ICD-10-CM | POA: Diagnosis not present

## 2016-02-13 DIAGNOSIS — H43813 Vitreous degeneration, bilateral: Secondary | ICD-10-CM

## 2016-02-13 DIAGNOSIS — I10 Essential (primary) hypertension: Secondary | ICD-10-CM

## 2016-02-13 DIAGNOSIS — H353112 Nonexudative age-related macular degeneration, right eye, intermediate dry stage: Secondary | ICD-10-CM

## 2016-02-13 DIAGNOSIS — H353221 Exudative age-related macular degeneration, left eye, with active choroidal neovascularization: Secondary | ICD-10-CM

## 2016-02-14 ENCOUNTER — Encounter: Payer: Self-pay | Admitting: Family

## 2016-03-02 ENCOUNTER — Encounter: Payer: Self-pay | Admitting: Family

## 2016-03-03 DIAGNOSIS — Z1231 Encounter for screening mammogram for malignant neoplasm of breast: Secondary | ICD-10-CM | POA: Diagnosis not present

## 2016-03-04 ENCOUNTER — Encounter: Payer: Self-pay | Admitting: Family

## 2016-03-13 ENCOUNTER — Other Ambulatory Visit: Payer: Self-pay | Admitting: Cardiovascular Disease

## 2016-03-14 ENCOUNTER — Other Ambulatory Visit: Payer: Self-pay | Admitting: Cardiovascular Disease

## 2016-03-26 ENCOUNTER — Ambulatory Visit (INDEPENDENT_AMBULATORY_CARE_PROVIDER_SITE_OTHER): Payer: Medicare HMO | Admitting: Family

## 2016-03-26 ENCOUNTER — Encounter: Payer: Self-pay | Admitting: Family

## 2016-03-26 VITALS — BP 130/66 | HR 79 | Temp 97.9°F | Ht 60.0 in | Wt 152.0 lb

## 2016-03-26 DIAGNOSIS — R110 Nausea: Secondary | ICD-10-CM

## 2016-03-26 DIAGNOSIS — R69 Illness, unspecified: Secondary | ICD-10-CM | POA: Diagnosis not present

## 2016-03-26 DIAGNOSIS — F411 Generalized anxiety disorder: Secondary | ICD-10-CM

## 2016-03-26 MED ORDER — SERTRALINE HCL 50 MG PO TABS: 50.0000 mg | ORAL_TABLET | Freq: Every day | ORAL | 1 refills | Status: DC

## 2016-03-26 MED ORDER — ONDANSETRON 4 MG PO TBDP: 4.0000 mg | ORAL_TABLET | Freq: Three times a day (TID) | ORAL | 1 refills | Status: DC | PRN

## 2016-03-26 NOTE — Patient Instructions (Addendum)
zofran as needed for nausea  Thinking about you

## 2016-03-26 NOTE — Progress Notes (Signed)
Subjective:    Patient ID: [name], female    DOB: Apr 15, 1946, 70 y.o.   MRN: [id number]  CC: [name] is a 70 y.o. female who presents today for follow up.   HPI: Follow-up for anxiety depression. Started Zoloft 2 months ago. Waking up at night worrying about abnormal pap smear. Waking about for an hour- two hours per night and feels very nauseated thinking about pap. She would like nausea medication. No abdominal pain, fever, diarrhea, vomiting.  Abnormal pap and referral placed to Dr [name], [facility] whom she sees next week. Would like print out of pap ( given) . Last pap 2014 was normal.        HISTORY:  Past Medical History:  Diagnosis Date  . Coronary artery disease 2011   CABG x 3  . Hyperlipidemia   . Hypertension   . Macular degeneration   . MI (myocardial infarction)    Past Surgical History:  Procedure Laterality Date  . CARDIAC CATHETERIZATION  2013   [facility]  . CATARACT EXTRACTION    . CORONARY ARTERY BYPASS GRAFT  02/07/2010   x 3  . FACIAL COSMETIC SURGERY  2013   Dr. Nadeen [name]  . FOOT SURGERY     right  . PARATHYROIDECTOMY     Family History  Problem Relation Age of Onset  . Family history unknown: Yes    Allergies: Patient has no known allergies. Current Outpatient Prescriptions on File Prior to Visit  Medication Sig Dispense Refill  . aspirin 81 MG EC tablet Take 81 mg by mouth daily.      . Cholecalciferol (VITAMIN D3) 1000 UNITS CAPS Take 1,000 Units by mouth 1 day or 1 dose.      . Coenzyme Q10 100 MG capsule Take 100 mg by mouth daily.     . ergocalciferol (DRISDOL) 50000 units capsule Take by mouth.    Marland Kitchen HYDROcodone-acetaminophen (NORCO) 7.5-325 MG per tablet Take 1 tablet by mouth every 6 (six) hours as needed for moderate pain.    Marland Kitchen KRILL OIL PO Take 350 mg by mouth daily.    . metoprolol tartrate (LOPRESSOR) 25 MG tablet TAKE 1/2 TABLET BY MOUTH TWICE DAILY 90 tablet 3  . metoprolol tartrate (LOPRESSOR) 25 MG tablet TAKE  ONE-HALF TABLET BY MOUTH TWICE DAILY AS DIRECTED 90 tablet 3  . Multiple Vitamins-Minerals (PRESERVISION AREDS 2 PO) Take by mouth.    . pantoprazole (PROTONIX) 40 MG tablet TAKE ONE (1) TABLET BY MOUTH EVERY DAY 90 tablet 3  . Probiotic Product (PROBIOTIC FORMULA) CAPS Take by mouth daily.    . rosuvastatin (CRESTOR) 40 MG tablet TAKE ONE (1) TABLET EACH DAY 90 tablet 3  . Turmeric Curcumin 500 MG CAPS Take by mouth daily.     No current facility-administered medications on file prior to visit.     Social History  Substance Use Topics  . Smoking status: Former Smoker    Packs/day: 1.00    Years: 40.00    Types: Cigarettes    Quit date: 07/15/2009  . Smokeless tobacco: Never Used  . Alcohol use 0.5 oz/week    1 Standard drinks or equivalent per week    Review of Systems  Constitutional: Negative for chills and fever.  Respiratory: Negative for cough.   Cardiovascular: Negative for chest pain and palpitations.  Gastrointestinal: Positive for nausea. Negative for abdominal distention, constipation, diarrhea and vomiting.  Psychiatric/Behavioral: Positive for sleep disturbance. The patient is nervous/anxious.  Objective:    BP 130/66   Pulse 79   Temp 97.9 F (36.6 C) (Oral)   Ht 5' (1.524 m)   Wt 152 lb (68.9 kg)   SpO2 97%   BMI 29.69 kg/m  BP Readings from Last 3 Encounters:  03/26/16 130/66  01/21/16 140/72  12/19/15 134/74   Wt Readings from Last 3 Encounters:  03/26/16 152 lb (68.9 kg)  01/21/16 157 lb (71.2 kg)  12/19/15 154 lb (69.9 kg)    Physical Exam  Constitutional: She appears well-developed and well-nourished.  Eyes: Conjunctivae are normal.  Cardiovascular: Normal rate, regular rhythm, normal heart sounds and normal pulses.   Pulmonary/Chest: Effort normal and breath sounds normal. She has no wheezes. She has no rhonchi. She has no rales.  Neurological: She is alert.  Skin: Skin is warm and dry.  Psychiatric: She has a normal mood and affect.  Her speech is normal and behavior is normal. Thought content normal.  Vitals reviewed.      Assessment & Plan:   Problem List Items Addressed This Visit      Other   Generalized anxiety disorder    We jointly agreed to stay on current Zoloft dose. We will consider increasing after her appointment with GYN next week the patient remains so anxious. Patient will let me know. F/u 3 months.      Relevant Medications   sertraline (ZOLOFT) 50 MG tablet   Nausea without vomiting - Primary    Suspect nausea is coming from GAD and stress over abnormal pap. Reasonable to give patient a small prescription for low-dose Zofran take as needed. Return precautions given.      Relevant Medications   ondansetron (ZOFRAN ODT) 4 MG disintegrating tablet       I have discontinued [name]'s ALPRAZolam. I am also having her start on ondansetron. Additionally, I am having her maintain her aspirin, Vitamin D3, PROBIOTIC FORMULA, Coenzyme Q10, HYDROcodone-acetaminophen, Turmeric Curcumin, KRILL OIL PO, Multiple Vitamins-Minerals (PRESERVISION AREDS 2 PO), pantoprazole, ergocalciferol, rosuvastatin, metoprolol tartrate, metoprolol tartrate, and sertraline.   Meds ordered this encounter  Medications  . ondansetron (ZOFRAN ODT) 4 MG disintegrating tablet    Sig: Take 1 tablet (4 mg total) by mouth every 8 (eight) hours as needed for nausea or vomiting.    Dispense:  20 tablet    Refill:  1    Order Specific Question:   Supervising Provider    Answer:   [name] [[id number]]  . sertraline (ZOLOFT) 50 MG tablet    Sig: Take 1 tablet (50 mg total) by mouth at bedtime.    Dispense:  90 tablet    Refill:  1    Order Specific Question:   Supervising Provider    Answer:   [name] [[id number]]    Return precautions given.   Risks, benefits, and alternatives of the medications and treatment plan prescribed today were discussed, and patient expressed understanding.   Education regarding symptom  management and diagnosis given to patient on AVS.  Continue to follow with Brittany Paris, FNP for routine health maintenance.   [name] and I agreed with plan.   Brittany Paris, FNP

## 2016-03-26 NOTE — Assessment & Plan Note (Addendum)
New. Suspect nausea is coming from GAD and stress over abnormal pap. Reasonable to give patient a small prescription for low-dose Zofran take as needed. Return precautions given.

## 2016-03-26 NOTE — Assessment & Plan Note (Addendum)
Worsening.We jointly agreed to stay on current Zoloft dose. We will consider increasing after her appointment with GYN next week the patient remains so anxious. Patient will let me know. F/u 3 months.

## 2016-03-26 NOTE — Progress Notes (Signed)
Pre visit review using our clinic review tool, if applicable. No additional management support is needed unless otherwise documented below in the visit note. 

## 2016-04-02 DIAGNOSIS — N72 Inflammatory disease of cervix uteri: Secondary | ICD-10-CM | POA: Diagnosis not present

## 2016-04-02 DIAGNOSIS — N870 Mild cervical dysplasia: Secondary | ICD-10-CM | POA: Diagnosis not present

## 2016-04-02 DIAGNOSIS — N763 Subacute and chronic vulvitis: Secondary | ICD-10-CM | POA: Diagnosis not present

## 2016-04-02 DIAGNOSIS — N909 Noninflammatory disorder of vulva and perineum, unspecified: Secondary | ICD-10-CM | POA: Diagnosis not present

## 2016-04-02 DIAGNOSIS — R87619 Unspecified abnormal cytological findings in specimens from cervix uteri: Secondary | ICD-10-CM | POA: Diagnosis not present

## 2016-04-02 DIAGNOSIS — R69 Illness, unspecified: Secondary | ICD-10-CM | POA: Diagnosis not present

## 2016-04-07 ENCOUNTER — Other Ambulatory Visit: Payer: Self-pay

## 2016-04-07 NOTE — Telephone Encounter (Signed)
PT REQUEST 90 DAY SUPPLY

## 2016-04-07 NOTE — Telephone Encounter (Signed)
Looks like a 90 day supply was sent it on 03/17/16. Left VM to notify pt.

## 2016-04-09 ENCOUNTER — Encounter (INDEPENDENT_AMBULATORY_CARE_PROVIDER_SITE_OTHER): Payer: Medicare HMO | Admitting: Ophthalmology

## 2016-04-09 DIAGNOSIS — H43813 Vitreous degeneration, bilateral: Secondary | ICD-10-CM

## 2016-04-09 DIAGNOSIS — H353121 Nonexudative age-related macular degeneration, left eye, early dry stage: Secondary | ICD-10-CM | POA: Diagnosis not present

## 2016-04-09 DIAGNOSIS — I10 Essential (primary) hypertension: Secondary | ICD-10-CM | POA: Diagnosis not present

## 2016-04-09 DIAGNOSIS — H353112 Nonexudative age-related macular degeneration, right eye, intermediate dry stage: Secondary | ICD-10-CM

## 2016-04-09 DIAGNOSIS — H35033 Hypertensive retinopathy, bilateral: Secondary | ICD-10-CM

## 2016-05-03 ENCOUNTER — Encounter: Payer: Self-pay | Admitting: Family

## 2016-05-05 ENCOUNTER — Other Ambulatory Visit: Payer: Self-pay | Admitting: Family

## 2016-05-05 ENCOUNTER — Encounter: Payer: Self-pay | Admitting: Family

## 2016-05-05 ENCOUNTER — Other Ambulatory Visit: Payer: Self-pay | Admitting: Cardiovascular Disease

## 2016-05-05 DIAGNOSIS — R87629 Unspecified abnormal cytological findings in specimens from vagina: Secondary | ICD-10-CM | POA: Insufficient documentation

## 2016-05-05 DIAGNOSIS — F411 Generalized anxiety disorder: Secondary | ICD-10-CM

## 2016-05-18 ENCOUNTER — Encounter: Payer: Self-pay | Admitting: Family

## 2016-05-18 DIAGNOSIS — Z23 Encounter for immunization: Secondary | ICD-10-CM | POA: Diagnosis not present

## 2016-05-26 DIAGNOSIS — H353131 Nonexudative age-related macular degeneration, bilateral, early dry stage: Secondary | ICD-10-CM | POA: Diagnosis not present

## 2016-05-26 DIAGNOSIS — H26493 Other secondary cataract, bilateral: Secondary | ICD-10-CM | POA: Diagnosis not present

## 2016-05-29 ENCOUNTER — Encounter: Payer: Self-pay | Admitting: Family

## 2016-06-01 ENCOUNTER — Other Ambulatory Visit: Payer: Self-pay | Admitting: Family

## 2016-06-01 DIAGNOSIS — I10 Essential (primary) hypertension: Secondary | ICD-10-CM

## 2016-06-02 ENCOUNTER — Other Ambulatory Visit: Payer: Self-pay | Admitting: Family

## 2016-06-02 ENCOUNTER — Telehealth: Payer: Self-pay | Admitting: Family

## 2016-06-02 NOTE — Telephone Encounter (Signed)
Pt called requesting a refill on pantoprazole (PROTONIX) 40 MG tablet. She is completely out of medication and 90 day supply. Please advise, thank you!  [name], [name]  Call pt @ [phone]

## 2016-06-02 NOTE — Telephone Encounter (Signed)
Medication has already been done [id number]

## 2016-06-11 ENCOUNTER — Encounter (INDEPENDENT_AMBULATORY_CARE_PROVIDER_SITE_OTHER): Payer: Medicare HMO | Admitting: Ophthalmology

## 2016-06-11 DIAGNOSIS — H35033 Hypertensive retinopathy, bilateral: Secondary | ICD-10-CM | POA: Diagnosis not present

## 2016-06-11 DIAGNOSIS — H353112 Nonexudative age-related macular degeneration, right eye, intermediate dry stage: Secondary | ICD-10-CM | POA: Diagnosis not present

## 2016-06-11 DIAGNOSIS — H353221 Exudative age-related macular degeneration, left eye, with active choroidal neovascularization: Secondary | ICD-10-CM

## 2016-06-11 DIAGNOSIS — I10 Essential (primary) hypertension: Secondary | ICD-10-CM | POA: Diagnosis not present

## 2016-06-11 DIAGNOSIS — H43813 Vitreous degeneration, bilateral: Secondary | ICD-10-CM | POA: Diagnosis not present

## 2016-06-12 ENCOUNTER — Telehealth: Payer: Self-pay | Admitting: Family

## 2016-06-12 NOTE — Telephone Encounter (Signed)
Pt called wanting to know if the CMP will see everything.   Call pt @ [phone]. Thank you!

## 2016-06-16 ENCOUNTER — Other Ambulatory Visit: Payer: Self-pay | Admitting: Family

## 2016-06-16 DIAGNOSIS — I10 Essential (primary) hypertension: Secondary | ICD-10-CM | POA: Diagnosis not present

## 2016-06-16 NOTE — Telephone Encounter (Signed)
Patient was notified.

## 2016-06-17 LAB — LIPID PANEL
Chol/HDL Ratio: 2.2 ratio units (ref 0.0–4.4)
Cholesterol, Total: 138 mg/dL (ref 100–199)
HDL: 64 mg/dL (ref >39)
LDL Calculated: 55 mg/dL (ref 0–99)
Triglycerides: 96 mg/dL (ref 0–149)
VLDL CHOLESTEROL CAL: 19 mg/dL (ref 5–40)

## 2016-06-17 LAB — COMPREHENSIVE METABOLIC PANEL
ALBUMIN: 4.2 g/dL (ref 3.6–4.8)
ALK PHOS: 70 IU/L (ref 39–117)
ALT: 25 IU/L (ref 0–32)
AST: 27 IU/L (ref 0–40)
Albumin/Globulin Ratio: 1.8 (ref 1.2–2.2)
BILIRUBIN TOTAL: 0.4 mg/dL (ref 0.0–1.2)
BUN / CREAT RATIO: 22 (ref 12–28)
BUN: 19 mg/dL (ref 8–27)
CO2: 24 mmol/L (ref 18–29)
CREATININE: 0.86 mg/dL (ref 0.57–1.00)
Calcium: 10.1 mg/dL (ref 8.7–10.3)
Chloride: 97 mmol/L (ref 96–106)
GFR calc Af Amer: 80 mL/min/{1.73_m2} (ref >59)
GFR calc non Af Amer: 70 mL/min/{1.73_m2} (ref >59)
GLUCOSE: 97 mg/dL (ref 65–99)
Globulin, Total: 2.3 g/dL (ref 1.5–4.5)
Potassium: 5 mmol/L (ref 3.5–5.2)
Sodium: 134 mmol/L (ref 134–144)
Total Protein: 6.5 g/dL (ref 6.0–8.5)

## 2016-06-18 ENCOUNTER — Ambulatory Visit (INDEPENDENT_AMBULATORY_CARE_PROVIDER_SITE_OTHER): Payer: Medicare HMO | Admitting: Cardiovascular Disease

## 2016-06-18 ENCOUNTER — Encounter: Payer: Self-pay | Admitting: Cardiovascular Disease

## 2016-06-18 VITALS — BP 102/64 | HR 74 | Ht 60.0 in | Wt 152.0 lb

## 2016-06-18 DIAGNOSIS — E782 Mixed hyperlipidemia: Secondary | ICD-10-CM

## 2016-06-18 DIAGNOSIS — I739 Peripheral vascular disease, unspecified: Secondary | ICD-10-CM | POA: Diagnosis not present

## 2016-06-18 DIAGNOSIS — E6609 Other obesity due to excess calories: Secondary | ICD-10-CM

## 2016-06-18 DIAGNOSIS — I10 Essential (primary) hypertension: Secondary | ICD-10-CM | POA: Diagnosis not present

## 2016-06-18 DIAGNOSIS — I25118 Atherosclerotic heart disease of native coronary artery with other forms of angina pectoris: Secondary | ICD-10-CM | POA: Diagnosis not present

## 2016-06-18 DIAGNOSIS — I25810 Atherosclerosis of coronary artery bypass graft(s) without angina pectoris: Secondary | ICD-10-CM | POA: Diagnosis not present

## 2016-06-18 DIAGNOSIS — Z6833 Body mass index (BMI) 33.0-33.9, adult: Secondary | ICD-10-CM | POA: Diagnosis not present

## 2016-06-18 NOTE — Progress Notes (Signed)
Cardiology Office Note  Date:  06/18/2016   ID:  [name], DOB 1945/04/27, MRN [id number]  PCP:  [name], FNP   Chief Complaint  Patient presents with  . other    36mo f/u. Pt states she is doing well. Reviewed meds with pt verbally.    HPI:  [name] is a pleasant 71 year old woman with a history of coronary artery disease, stent to her left circumflex and 2 stents to the LAD in March of 2011, aorto femoral bypass grafting in early October 2011, with occlusion of her coronary stents in mid-October requiring bypass surgery at [facility] in mid to late October 2012.  Cath 2013: occluded SVG to PDA She presents for routine followup of her coronary artery disease. History of thyroid surgery March 2014 long history of smoking though stopped in March of 2011 plastic surgery on her face in the past  In follow-up, she reports that she is doing well, Denies any significant chest pain Weight continues to run high, no regular exercise program  Lab work reviewed with her in detail Total chol 138, LDL 55,  BMP normal, glu <100 HBA1C 5.7, done 6 months ago  History ofMacular degeneration, getting eye shots Had cataract surg, now eyes are doing well  epsiodes of back pain, takes pain med prn  buying products from [employer] for weight loss   EKG on today's visit Reviewed by myself personally shows normal sinus rhythm with rate 74 bpm, Nonspecific ST abnormality anterolateral leads, no change compared to previous EKGs   Other past medical history cardiac catheterization showed LIMA to the LAD, vein graft to the left circumflex, vein graft to the PDA which is occluded that supplies a very small area. Left main has 50% disease, ostial LAD has 95% in-stent restenosis, mid LAD has 100% disease, 75% ostial circumflex, 100% mid circumflex, very small OM1, 30% RCA disease, ejection fraction 45%  Stress test shows suboptimal images due to GI activity on resting images, significant  anterior wall ischemia in the mid to distal segments, reduced ejection fraction of 47%  Prior ventricular arrhythmia when her stents were placed in the Cath Lab requiring shock.  echocardiogram improved, EF greater than 40%.  Cardiac catheter report from July 15 2009 indicate she had a vision bare metal stent 2.75 x 28 mm in the LCX, mini vision bare metal stent 2.5 x 28 and 2.5 x 15 mm stent to the LAD.  DJD in her neck and back  PMH:   has a past medical history of Coronary artery disease (2011); Hyperlipidemia; Hypertension; Macular degeneration; and MI (myocardial infarction).  PSH:    Past Surgical History:  Procedure Laterality Date  . CARDIAC CATHETERIZATION  2013   [facility]  . CATARACT EXTRACTION    . CORONARY ARTERY BYPASS GRAFT  02/07/2010   x 3  . FACIAL COSMETIC SURGERY  2013   Dr. Nadeen Landau  . FOOT SURGERY     right  . PARATHYROIDECTOMY      Current Outpatient Prescriptions  Medication Sig Dispense Refill  . aspirin 81 MG EC tablet Take 81 mg by mouth daily.      . Cholecalciferol (VITAMIN D3) 1000 UNITS CAPS Take 1,000 Units by mouth 1 day or 1 dose.      . Coenzyme Q10 100 MG capsule Take 100 mg by mouth daily.     . ergocalciferol (DRISDOL) 50000 units capsule Take by mouth.    Marland Kitchen HYDROcodone-acetaminophen (NORCO) 7.5-325 MG per tablet Take 1 tablet  by mouth every 6 (six) hours as needed for moderate pain.    Marland Kitchen KRILL OIL PO Take 350 mg by mouth daily.    . metoprolol tartrate (LOPRESSOR) 25 MG tablet TAKE ONE-HALF TABLET BY MOUTH TWICE DAILY AS DIRECTED 90 tablet 3  . Multiple Vitamins-Minerals (PRESERVISION AREDS 2 PO) Take by mouth.    . pantoprazole (PROTONIX) 40 MG tablet TAKE ONE (1) TABLET BY MOUTH EVERY DAY 90 tablet 3  . Probiotic Product (PROBIOTIC FORMULA) CAPS Take by mouth daily.    . rosuvastatin (CRESTOR) 40 MG tablet TAKE ONE (1) TABLET EACH DAY 90 tablet 3  . sertraline (ZOLOFT) 50 MG tablet Take 1 tablet (50 mg total) by mouth at  bedtime. 90 tablet 1  . Turmeric Curcumin 500 MG CAPS Take by mouth daily.    Marland Kitchen UNABLE TO FIND Take 4 g by mouth daily. Med Name: IsAgenix dietary supplement. Powder.     No current facility-administered medications for this visit.      Allergies:   Patient has no known allergies.   Social History:  The patient  reports that she quit smoking about 6 years ago. Her smoking use included Cigarettes. She has a 40.00 pack-year smoking history. She has never used smokeless tobacco. She reports that she drinks about 0.5 oz of alcohol per week . She reports that she does not use drugs.   Family History:   Family history is unknown by patient.    Review of Systems: Review of Systems  Constitutional: Negative.   Respiratory: Negative.   Cardiovascular: Negative.   Gastrointestinal: Negative.   Musculoskeletal: Negative.   Neurological: Negative.   Psychiatric/Behavioral: Negative.   All other systems reviewed and are negative.    PHYSICAL EXAM: VS:  BP 102/64 (BP Location: Left Arm, Patient Position: Sitting, Cuff Size: Normal)   Pulse 74   Ht 5' (1.524 m)   Wt 152 lb (68.9 kg)   BMI 29.69 kg/m  , BMI Body mass index is 29.69 kg/m. GEN: Well nourished, well developed, in no acute distress , obese HEENT: normal  Neck: no JVD, carotid bruits, or masses Cardiac: RRR; no murmurs, rubs, or gallops,no edema  Respiratory:  clear to auscultation bilaterally, normal work of breathing GI: soft, nontender, nondistended, + BS MS: no deformity or atrophy  Skin: warm and dry, no rash Neuro:  Strength and sensation are intact Psych: euthymic mood, full affect    Recent Labs: 09/26/2015: Platelets 241; TSH 3.170 06/16/2016: ALT 25; BUN 19; Creatinine, Ser 0.86; Potassium 5.0; Sodium 134    Lipid Panel Lab Results  Component Value Date   CHOL 138 06/16/2016   HDL 64 06/16/2016   LDLCALC 55 06/16/2016   TRIG 96 06/16/2016      Wt Readings from Last 3 Encounters:  06/18/16 152 lb  (68.9 kg)  03/26/16 152 lb (68.9 kg)  01/21/16 157 lb (71.2 kg)       ASSESSMENT AND PLAN:  Mixed hyperlipidemia - Plan: EKG 12-Lead Cholesterol is at goal on the current lipid regimen. No changes to the medications were made.  Coronary atherosclerosis of autologous vein bypass graft without angina - Plan: EKG 12-Lead Currently with no symptoms of angina. No further workup at this time. Continue current medication regimen.  PVD (peripheral vascular disease) ([place]) - Plan: EKG 12-Lead Stable, no sx of claudication previous ultrasounds reviewed  Essential hypertension, benign - Plan: EKG 12-Lead Blood pressure is well controlled on today's visit. No changes made to the medications.  Coronary artery disease of native artery of native heart with stable angina pectoris ([place]) - Plan: EKG 12-Lead  Class 1 obesity due to excess calories without serious comorbidity with body mass index (BMI) of 33.0 to 33.9 in adult We have encouraged continued exercise, careful diet management in an effort to lose weight.   Total encounter time more than 25 minutes  Greater than 50% was spent in counseling and coordination of care with the patient   Disposition:   F/U  6 months   Orders Placed This Encounter  Procedures  . EKG 12-Lead     Signed, Esmond Plants, M.D., Ph.D. 06/18/2016  [place], [place]

## 2016-06-18 NOTE — Patient Instructions (Signed)

## 2016-06-21 ENCOUNTER — Encounter: Payer: Self-pay | Admitting: Family

## 2016-06-30 DIAGNOSIS — M79672 Pain in left foot: Secondary | ICD-10-CM | POA: Diagnosis not present

## 2016-07-08 DIAGNOSIS — M7062 Trochanteric bursitis, left hip: Secondary | ICD-10-CM | POA: Diagnosis not present

## 2016-07-08 DIAGNOSIS — M84375A Stress fracture, left foot, initial encounter for fracture: Secondary | ICD-10-CM | POA: Diagnosis not present

## 2016-08-04 DIAGNOSIS — M25572 Pain in left ankle and joints of left foot: Secondary | ICD-10-CM | POA: Diagnosis not present

## 2016-08-13 ENCOUNTER — Encounter (INDEPENDENT_AMBULATORY_CARE_PROVIDER_SITE_OTHER): Payer: Medicare HMO | Admitting: Ophthalmology

## 2016-08-13 DIAGNOSIS — I10 Essential (primary) hypertension: Secondary | ICD-10-CM | POA: Diagnosis not present

## 2016-08-13 DIAGNOSIS — H353112 Nonexudative age-related macular degeneration, right eye, intermediate dry stage: Secondary | ICD-10-CM

## 2016-08-13 DIAGNOSIS — H43813 Vitreous degeneration, bilateral: Secondary | ICD-10-CM | POA: Diagnosis not present

## 2016-08-13 DIAGNOSIS — H353221 Exudative age-related macular degeneration, left eye, with active choroidal neovascularization: Secondary | ICD-10-CM

## 2016-08-13 DIAGNOSIS — H35033 Hypertensive retinopathy, bilateral: Secondary | ICD-10-CM | POA: Diagnosis not present

## 2016-08-15 ENCOUNTER — Encounter: Payer: Self-pay | Admitting: Family

## 2016-08-17 ENCOUNTER — Other Ambulatory Visit: Payer: Self-pay | Admitting: Family

## 2016-08-17 DIAGNOSIS — F411 Generalized anxiety disorder: Secondary | ICD-10-CM

## 2016-08-17 MED ORDER — SERTRALINE HCL 50 MG PO TABS: 100.0000 mg | ORAL_TABLET | Freq: Every day | ORAL | 1 refills | Status: DC

## 2016-08-31 ENCOUNTER — Telehealth: Payer: Self-pay

## 2016-08-31 NOTE — Telephone Encounter (Signed)
Patient is on the list for [employer] [facility] and may be a good candidate for an AWV. Please let me know if/when appt is scheduled.   

## 2016-09-02 NOTE — Telephone Encounter (Signed)
Left pt message asking to call Allison back directly at [phone] to schedule AWV. Thanks! °

## 2016-09-25 NOTE — Telephone Encounter (Signed)
Left pt message asking to call Allison back directly at [phone] to schedule AWV. Thanks! °

## 2016-10-04 DIAGNOSIS — S82831A Other fracture of upper and lower end of right fibula, initial encounter for closed fracture: Secondary | ICD-10-CM | POA: Diagnosis not present

## 2016-10-04 DIAGNOSIS — S92321A Displaced fracture of second metatarsal bone, right foot, initial encounter for closed fracture: Secondary | ICD-10-CM | POA: Diagnosis not present

## 2016-10-04 DIAGNOSIS — S82891A Other fracture of right lower leg, initial encounter for closed fracture: Secondary | ICD-10-CM | POA: Diagnosis not present

## 2016-10-04 DIAGNOSIS — S82851A Displaced trimalleolar fracture of right lower leg, initial encounter for closed fracture: Secondary | ICD-10-CM | POA: Diagnosis not present

## 2016-10-04 DIAGNOSIS — W171XXA Fall into storm drain or manhole, initial encounter: Secondary | ICD-10-CM | POA: Diagnosis not present

## 2016-10-05 DIAGNOSIS — S82831A Other fracture of upper and lower end of right fibula, initial encounter for closed fracture: Secondary | ICD-10-CM | POA: Diagnosis not present

## 2016-10-05 DIAGNOSIS — W171XXA Fall into storm drain or manhole, initial encounter: Secondary | ICD-10-CM | POA: Diagnosis not present

## 2016-10-05 DIAGNOSIS — S82851A Displaced trimalleolar fracture of right lower leg, initial encounter for closed fracture: Secondary | ICD-10-CM | POA: Diagnosis not present

## 2016-10-06 ENCOUNTER — Emergency Department
Admission: EM | Admit: 2016-10-06 | Discharge: 2016-10-06 | Disposition: A | Discharge status: Home / Self Care | Payer: Medicare HMO

## 2016-10-06 DIAGNOSIS — S82851A Displaced trimalleolar fracture of right lower leg, initial encounter for closed fracture: Secondary | ICD-10-CM | POA: Diagnosis not present

## 2016-10-08 ENCOUNTER — Ambulatory Visit: Payer: MEDICARE | Admitting: Anesthesiology

## 2016-10-08 ENCOUNTER — Encounter: Payer: Self-pay | Admitting: *Deleted

## 2016-10-08 ENCOUNTER — Encounter
Admission: AD | Disposition: A | Discharge status: Skilled Nursing Facility | Payer: Self-pay | Source: Ambulatory Visit | Attending: Surgery

## 2016-10-08 ENCOUNTER — Inpatient Hospital Stay
Admission: AD | Admit: 2016-10-08 | Discharge: 2016-10-09 | DRG: 494 | Disposition: A | Discharge status: Skilled Nursing Facility | Payer: MEDICARE | Source: Ambulatory Visit | Attending: Surgery | Admitting: Surgery

## 2016-10-08 ENCOUNTER — Ambulatory Visit: Payer: MEDICARE

## 2016-10-08 DIAGNOSIS — I2510 Atherosclerotic heart disease of native coronary artery without angina pectoris: Secondary | ICD-10-CM | POA: Diagnosis not present

## 2016-10-08 DIAGNOSIS — S82851A Displaced trimalleolar fracture of right lower leg, initial encounter for closed fracture: Principal | ICD-10-CM | POA: Diagnosis present

## 2016-10-08 DIAGNOSIS — S82853A Displaced trimalleolar fracture of unspecified lower leg, initial encounter for closed fracture: Secondary | ICD-10-CM | POA: Diagnosis present

## 2016-10-08 DIAGNOSIS — I739 Peripheral vascular disease, unspecified: Secondary | ICD-10-CM | POA: Diagnosis not present

## 2016-10-08 DIAGNOSIS — K219 Gastro-esophageal reflux disease without esophagitis: Secondary | ICD-10-CM | POA: Diagnosis not present

## 2016-10-08 DIAGNOSIS — Y92414 Local residential or business street as the place of occurrence of the external cause: Secondary | ICD-10-CM | POA: Diagnosis not present

## 2016-10-08 DIAGNOSIS — R69 Illness, unspecified: Secondary | ICD-10-CM | POA: Diagnosis not present

## 2016-10-08 DIAGNOSIS — M25571 Pain in right ankle and joints of right foot: Secondary | ICD-10-CM | POA: Diagnosis not present

## 2016-10-08 DIAGNOSIS — Z7401 Bed confinement status: Secondary | ICD-10-CM | POA: Diagnosis not present

## 2016-10-08 DIAGNOSIS — W010XXA Fall on same level from slipping, tripping and stumbling without subsequent striking against object, initial encounter: Secondary | ICD-10-CM | POA: Diagnosis not present

## 2016-10-08 DIAGNOSIS — Z87891 Personal history of nicotine dependence: Secondary | ICD-10-CM | POA: Diagnosis not present

## 2016-10-08 DIAGNOSIS — I25810 Atherosclerosis of coronary artery bypass graft(s) without angina pectoris: Secondary | ICD-10-CM | POA: Diagnosis not present

## 2016-10-08 DIAGNOSIS — Z95818 Presence of other cardiac implants and grafts: Secondary | ICD-10-CM | POA: Diagnosis not present

## 2016-10-08 DIAGNOSIS — Z955 Presence of coronary angioplasty implant and graft: Secondary | ICD-10-CM | POA: Diagnosis not present

## 2016-10-08 DIAGNOSIS — Z951 Presence of aortocoronary bypass graft: Secondary | ICD-10-CM | POA: Diagnosis not present

## 2016-10-08 DIAGNOSIS — I10 Essential (primary) hypertension: Secondary | ICD-10-CM | POA: Diagnosis present

## 2016-10-08 DIAGNOSIS — M6281 Muscle weakness (generalized): Secondary | ICD-10-CM | POA: Diagnosis not present

## 2016-10-08 DIAGNOSIS — Z419 Encounter for procedure for purposes other than remedying health state, unspecified: Secondary | ICD-10-CM

## 2016-10-08 DIAGNOSIS — X58XXXA Exposure to other specified factors, initial encounter: Secondary | ICD-10-CM | POA: Diagnosis not present

## 2016-10-08 DIAGNOSIS — H3530 Unspecified macular degeneration: Secondary | ICD-10-CM | POA: Diagnosis present

## 2016-10-08 DIAGNOSIS — E785 Hyperlipidemia, unspecified: Secondary | ICD-10-CM | POA: Diagnosis present

## 2016-10-08 DIAGNOSIS — Z7982 Long term (current) use of aspirin: Secondary | ICD-10-CM

## 2016-10-08 DIAGNOSIS — S82891A Other fracture of right lower leg, initial encounter for closed fracture: Secondary | ICD-10-CM | POA: Diagnosis not present

## 2016-10-08 DIAGNOSIS — Z9181 History of falling: Secondary | ICD-10-CM | POA: Diagnosis not present

## 2016-10-08 DIAGNOSIS — I252 Old myocardial infarction: Secondary | ICD-10-CM

## 2016-10-08 DIAGNOSIS — Y9301 Activity, walking, marching and hiking: Secondary | ICD-10-CM | POA: Diagnosis present

## 2016-10-08 DIAGNOSIS — S82851D Displaced trimalleolar fracture of right lower leg, subsequent encounter for closed fracture with routine healing: Secondary | ICD-10-CM | POA: Diagnosis not present

## 2016-10-08 DIAGNOSIS — S82831A Other fracture of upper and lower end of right fibula, initial encounter for closed fracture: Secondary | ICD-10-CM | POA: Diagnosis not present

## 2016-10-08 DIAGNOSIS — M810 Age-related osteoporosis without current pathological fracture: Secondary | ICD-10-CM | POA: Diagnosis not present

## 2016-10-08 DIAGNOSIS — R262 Difficulty in walking, not elsewhere classified: Secondary | ICD-10-CM | POA: Diagnosis not present

## 2016-10-08 DIAGNOSIS — Z01812 Encounter for preprocedural laboratory examination: Secondary | ICD-10-CM | POA: Diagnosis present

## 2016-10-08 HISTORY — PX: ORIF ANKLE FRACTURE: SHX5408

## 2016-10-08 IMAGING — XA DG ANKLE 2V *R*
3 series · 7 of 7 positions shown · non-contrast
Comparison: None.

FLUOROSCOPY TIME:  0 minutes 36 seconds ; 0.70 mGy; 4 acquired
images

CLINICAL DATA: Open reduction internal fixation for fractures

EXAM:
DG C-ARM 61-120 MIN; RIGHT ANKLE - 2 VIEW

[Series 3: ortho standard · 1 of 1 slices shown (1 of 3)]
[im 1/1]
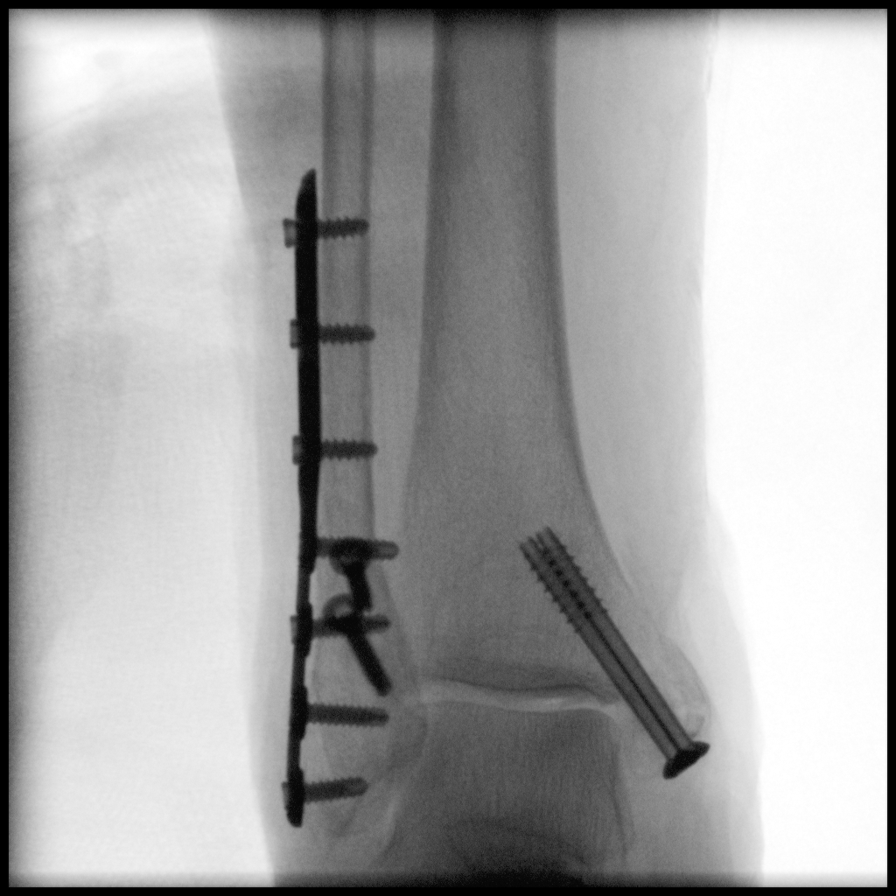

[Series 4: ortho standard · 1 of 1 slices shown (2 of 3)]
[im 1/1]
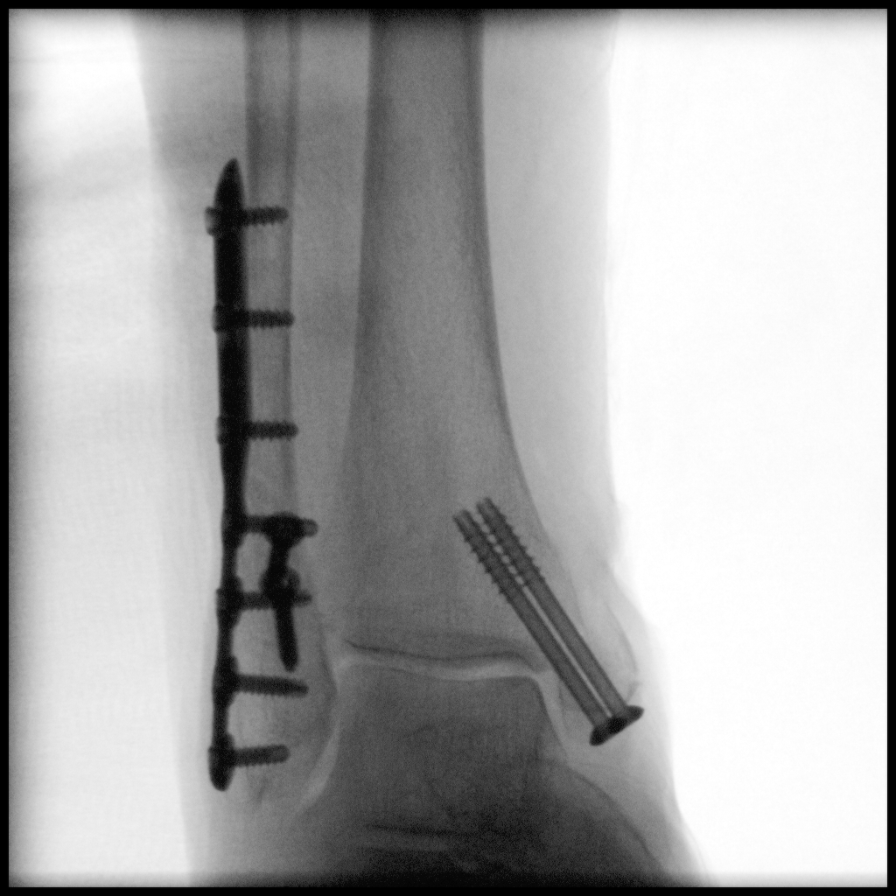

[Series 6: ortho standard · 2 acquisitions, 5 frames shown (3 of 3)]
[im 1/2]
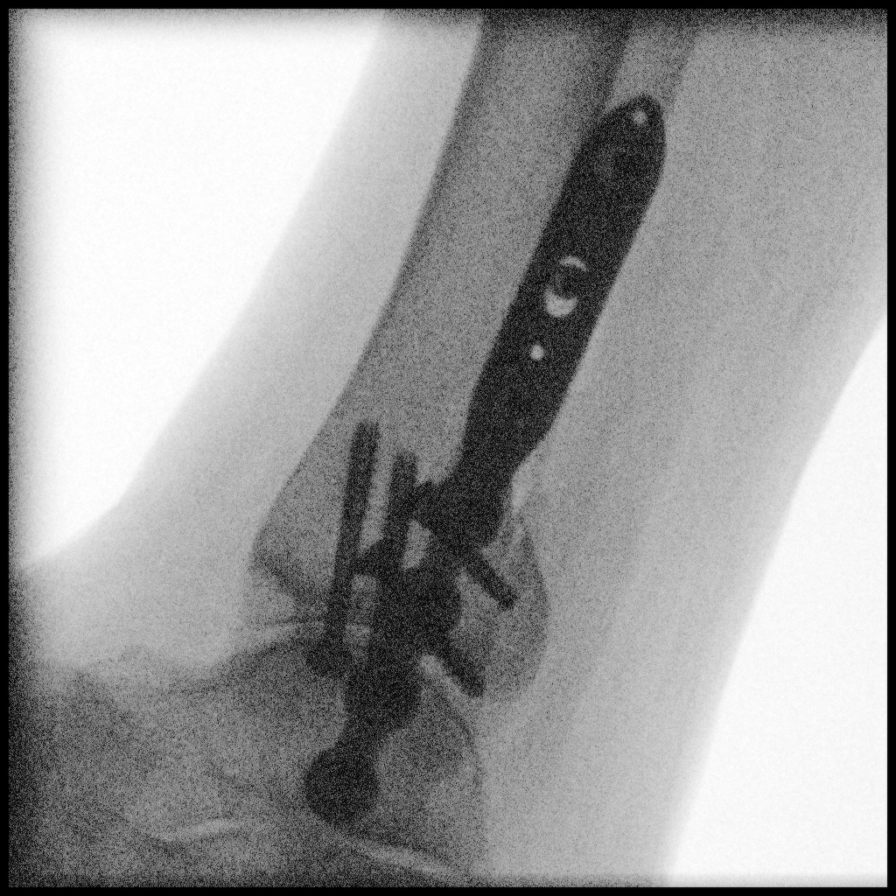
[im 1/2]
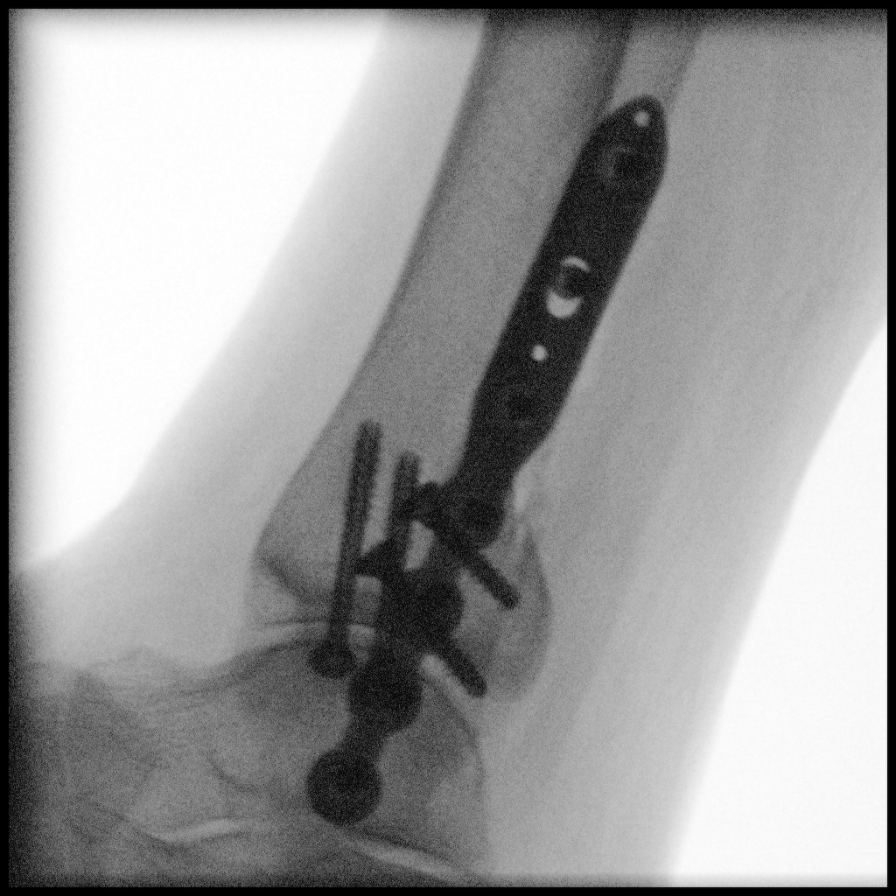
[im 1/2]
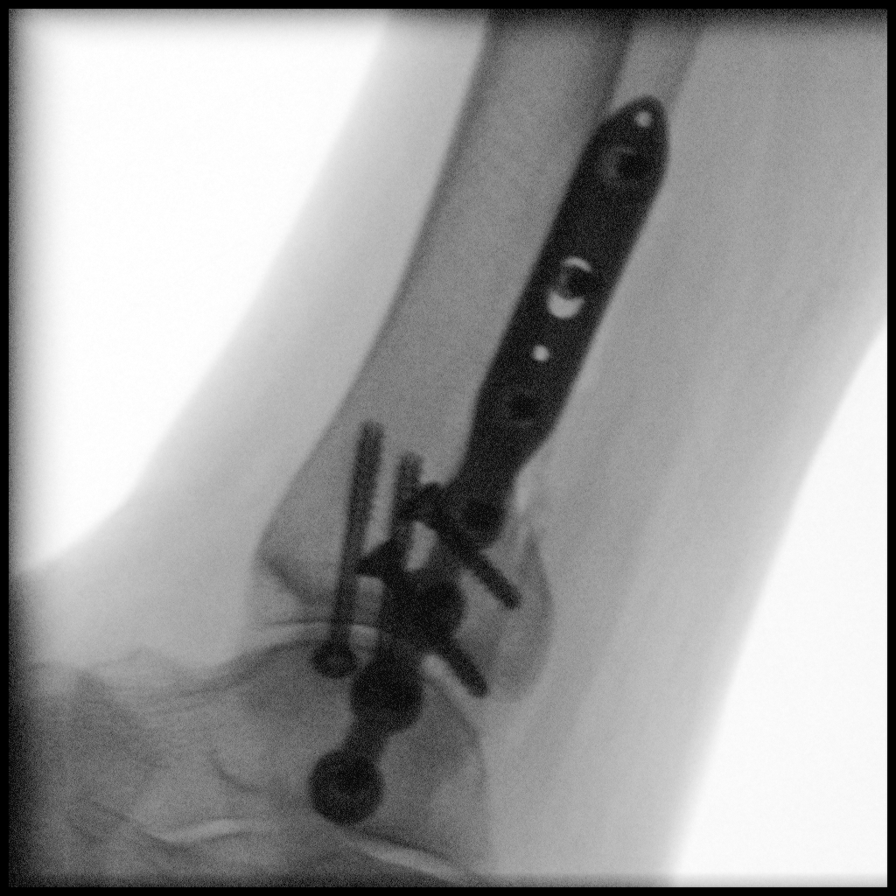
[im 1/2]
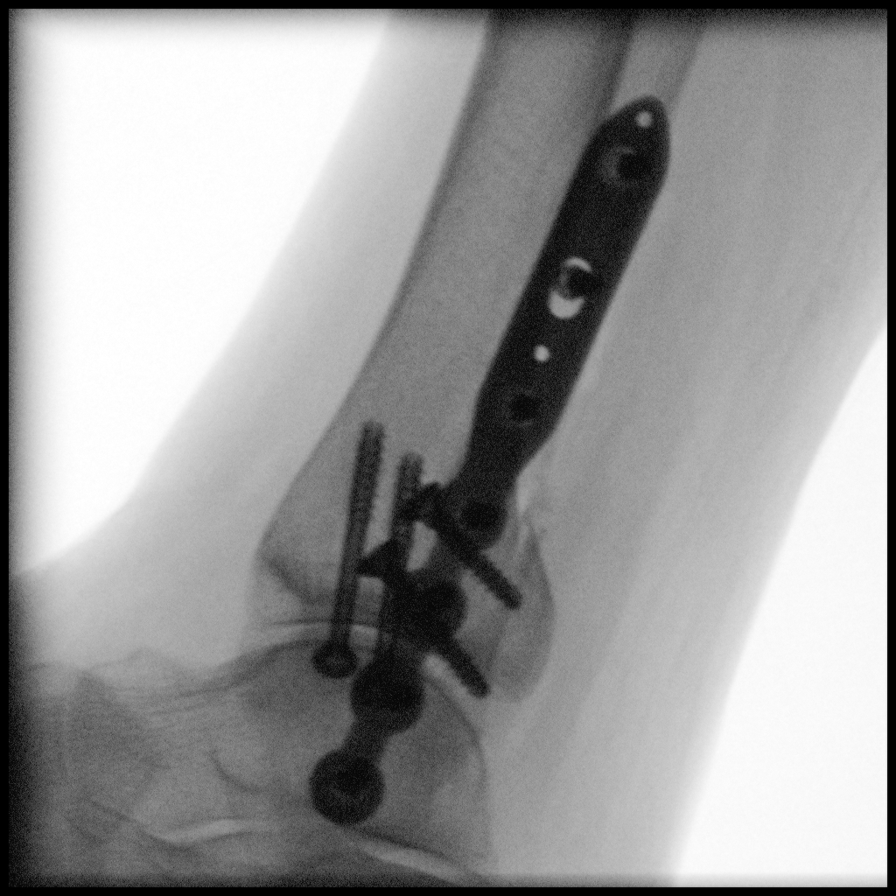
[im 2/2]
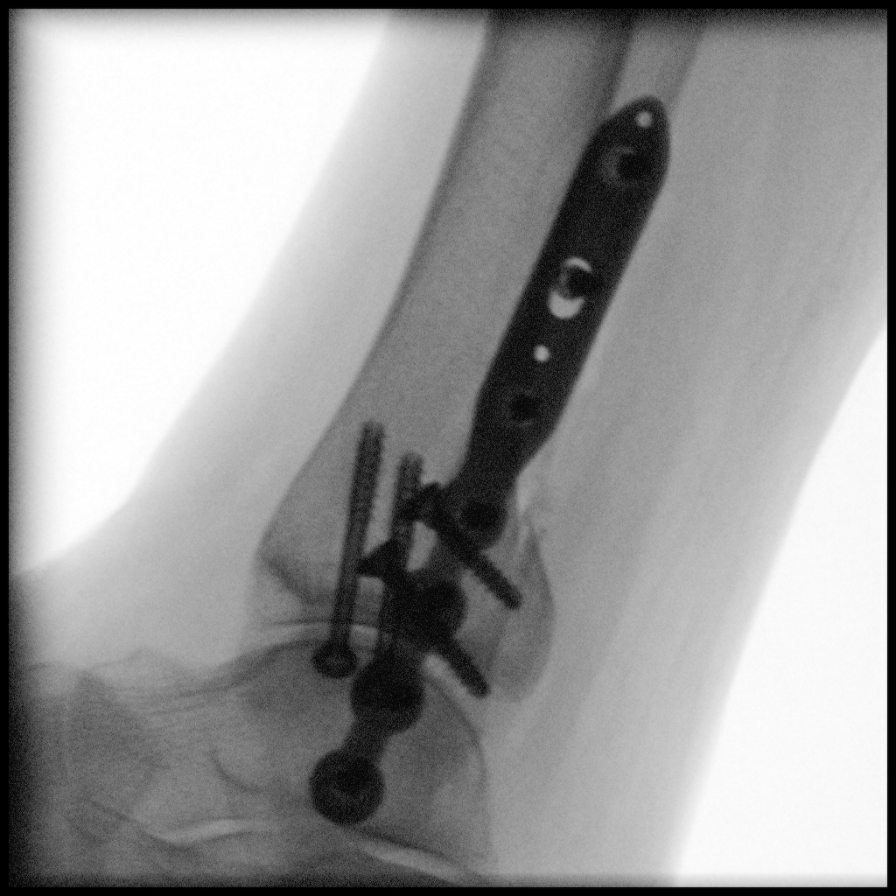

[7 of 7 positions shown; findings below may reference images not displayed]

FINDINGS: Frontal and lateral views obtained. There are 2 screws transfixing a
fracture of the medial malleolus. There is screw and plate fixation
in the distal fibula transfixing a distal fibular fracture.
Alignment at the fracture sites is essentially anatomic. Ankle
mortise appears intact.
IMPRESSION: Fixation of fractures of the medial malleolus and distal fibula with
alignment essentially anatomic on submitted images.

## 2016-10-08 SURGERY — OPEN REDUCTION INTERNAL FIXATION (ORIF) ANKLE FRACTURE
Anesthesia: General | Site: Ankle | Laterality: Right | Wound class: Clean

## 2016-10-08 MED ORDER — PANTOPRAZOLE SODIUM 40 MG PO TBEC
40.0000 mg | DELAYED_RELEASE_TABLET | Freq: Every day | ORAL | Status: DC
  Administered 2016-10-09: 40 mg via ORAL

## 2016-10-08 MED ORDER — KETOROLAC TROMETHAMINE 15 MG/ML IJ SOLN: 15.0000 mg | Freq: Once | INTRAMUSCULAR | Status: DC

## 2016-10-08 MED ORDER — LACTATED RINGERS IV SOLN
INTRAVENOUS | Status: DC
  Administered 2016-10-08: 14:00:00 via INTRAVENOUS

## 2016-10-08 MED ORDER — ROCURONIUM BROMIDE 100 MG/10ML IV SOLN
INTRAVENOUS | Status: DC | PRN
  Administered 2016-10-08: 10 mg via INTRAVENOUS
  Administered 2016-10-08: 30 mg via INTRAVENOUS
  Administered 2016-10-08: 5 mg via INTRAVENOUS

## 2016-10-08 MED ORDER — VITAMIN D3 25 MCG (1000 UT) PO CAPS: 1000.0000 [IU] | ORAL_CAPSULE | ORAL | Status: DC

## 2016-10-08 MED ORDER — KRILL OIL 350 MG PO CAPS: 350.0000 mg | ORAL_CAPSULE | Freq: Every day | ORAL | Status: DC

## 2016-10-08 MED ORDER — DIPHENHYDRAMINE HCL 12.5 MG/5ML PO ELIX: 12.5000 mg | ORAL_SOLUTION | ORAL | Status: DC | PRN

## 2016-10-08 MED ORDER — FENTANYL CITRATE (PF) 100 MCG/2ML IJ SOLN: 25.0000 ug | INTRAMUSCULAR | Status: DC | PRN

## 2016-10-08 MED ORDER — METOPROLOL TARTRATE 25 MG PO TABS
12.5000 mg | ORAL_TABLET | Freq: Two times a day (BID) | ORAL | Status: DC
  Administered 2016-10-08 – 2016-10-09 (×2): 12.5 mg via ORAL
  Filled 2016-10-08 (×2): qty 1

## 2016-10-08 MED ORDER — CEFAZOLIN SODIUM-DEXTROSE 2-4 GM/100ML-% IV SOLN
2.0000 g | Freq: Four times a day (QID) | INTRAVENOUS | Status: AC
  Administered 2016-10-08 – 2016-10-09 (×3): 2 g via INTRAVENOUS
  Filled 2016-10-08 (×3): qty 100

## 2016-10-08 MED ORDER — RISAQUAD PO CAPS
ORAL_CAPSULE | Freq: Every day | ORAL | Status: DC
  Administered 2016-10-09: 1 via ORAL
  Filled 2016-10-08: qty 1

## 2016-10-08 MED ORDER — PROPOFOL 10 MG/ML IV BOLUS
INTRAVENOUS | Status: DC | PRN
  Administered 2016-10-08: 140 mg via INTRAVENOUS

## 2016-10-08 MED ORDER — CEFAZOLIN SODIUM-DEXTROSE 2-4 GM/100ML-% IV SOLN
INTRAVENOUS | Status: AC
  Filled 2016-10-08: qty 100

## 2016-10-08 MED ORDER — PROPOFOL 10 MG/ML IV BOLUS
INTRAVENOUS | Status: AC
  Filled 2016-10-08: qty 20

## 2016-10-08 MED ORDER — FLEET ENEMA 7-19 GM/118ML RE ENEM: 1.0000 | ENEMA | Freq: Once | RECTAL | Status: DC | PRN

## 2016-10-08 MED ORDER — TURMERIC CURCUMIN 500 MG PO CAPS: ORAL_CAPSULE | Freq: Every day | ORAL | Status: DC

## 2016-10-08 MED ORDER — OCUVITE-LUTEIN PO CAPS
ORAL_CAPSULE | Freq: Two times a day (BID) | ORAL | Status: DC
  Administered 2016-10-08 – 2016-10-09 (×2): 1 via ORAL
  Filled 2016-10-08 (×3): qty 1

## 2016-10-08 MED ORDER — KETOROLAC TROMETHAMINE 15 MG/ML IJ SOLN
7.5000 mg | Freq: Four times a day (QID) | INTRAMUSCULAR | Status: DC
  Administered 2016-10-09 (×3): 7.5 mg via INTRAVENOUS
  Filled 2016-10-08 (×3): qty 1

## 2016-10-08 MED ORDER — ONDANSETRON HCL 4 MG/2ML IJ SOLN: 4.0000 mg | Freq: Four times a day (QID) | INTRAMUSCULAR | Status: DC | PRN

## 2016-10-08 MED ORDER — FAMOTIDINE 20 MG PO TABS
ORAL_TABLET | ORAL | Status: AC
  Administered 2016-10-08: 20 mg via ORAL
  Filled 2016-10-08: qty 1

## 2016-10-08 MED ORDER — ACETAMINOPHEN 325 MG PO TABS: 650.0000 mg | ORAL_TABLET | Freq: Four times a day (QID) | ORAL | Status: DC | PRN

## 2016-10-08 MED ORDER — FENTANYL CITRATE (PF) 100 MCG/2ML IJ SOLN
INTRAMUSCULAR | Status: DC | PRN
  Administered 2016-10-08 (×3): 50 ug via INTRAVENOUS

## 2016-10-08 MED ORDER — KETOROLAC TROMETHAMINE 15 MG/ML IJ SOLN: 7.5000 mg | Freq: Four times a day (QID) | INTRAMUSCULAR | Status: DC

## 2016-10-08 MED ORDER — NEOMYCIN-POLYMYXIN B GU 40-200000 IR SOLN
Status: DC | PRN
  Administered 2016-10-08: 4 mL

## 2016-10-08 MED ORDER — SUGAMMADEX SODIUM 200 MG/2ML IV SOLN
INTRAVENOUS | Status: DC | PRN
  Administered 2016-10-08: 137.8 mg via INTRAVENOUS

## 2016-10-08 MED ORDER — FAMOTIDINE 20 MG PO TABS
20.0000 mg | ORAL_TABLET | Freq: Once | ORAL | Status: AC
  Administered 2016-10-08: 20 mg via ORAL

## 2016-10-08 MED ORDER — PHENYLEPHRINE HCL 10 MG/ML IJ SOLN
INTRAMUSCULAR | Status: DC | PRN
  Administered 2016-10-08 (×2): 50 ug via INTRAVENOUS
  Administered 2016-10-08 (×3): 100 ug via INTRAVENOUS

## 2016-10-08 MED ORDER — DEXAMETHASONE SODIUM PHOSPHATE 10 MG/ML IJ SOLN
INTRAMUSCULAR | Status: DC | PRN
  Administered 2016-10-08: 10 mg via INTRAVENOUS

## 2016-10-08 MED ORDER — ACETAMINOPHEN 10 MG/ML IV SOLN
INTRAVENOUS | Status: AC
  Filled 2016-10-08: qty 100

## 2016-10-08 MED ORDER — SERTRALINE HCL 50 MG PO TABS
100.0000 mg | ORAL_TABLET | Freq: Every day | ORAL | Status: DC
  Administered 2016-10-08: 100 mg via ORAL
  Filled 2016-10-08: qty 2

## 2016-10-08 MED ORDER — SODIUM CHLORIDE FLUSH 0.9 % IV SOLN
INTRAVENOUS | Status: AC
  Filled 2016-10-08: qty 20

## 2016-10-08 MED ORDER — CEFAZOLIN SODIUM-DEXTROSE 2-4 GM/100ML-% IV SOLN
2.0000 g | Freq: Once | INTRAVENOUS | Status: AC
  Administered 2016-10-08: 2 g via INTRAVENOUS

## 2016-10-08 MED ORDER — ASPIRIN EC 81 MG PO TBEC
81.0000 mg | DELAYED_RELEASE_TABLET | Freq: Every day | ORAL | Status: DC
  Administered 2016-10-08: 81 mg via ORAL
  Filled 2016-10-08: qty 1

## 2016-10-08 MED ORDER — MIDAZOLAM HCL 2 MG/2ML IJ SOLN
INTRAMUSCULAR | Status: AC
  Filled 2016-10-08: qty 2

## 2016-10-08 MED ORDER — ASPIRIN EC 81 MG PO TBEC: 81.0000 mg | DELAYED_RELEASE_TABLET | Freq: Every day | ORAL | Status: DC

## 2016-10-08 MED ORDER — ROSUVASTATIN CALCIUM 10 MG PO TABS
40.0000 mg | ORAL_TABLET | Freq: Every day | ORAL | Status: DC
  Administered 2016-10-08: 40 mg via ORAL
  Filled 2016-10-08 (×2): qty 4

## 2016-10-08 MED ORDER — ONDANSETRON HCL 4 MG PO TABS: 4.0000 mg | ORAL_TABLET | Freq: Four times a day (QID) | ORAL | Status: DC | PRN

## 2016-10-08 MED ORDER — ACETAMINOPHEN 500 MG PO TABS
1000.0000 mg | ORAL_TABLET | Freq: Four times a day (QID) | ORAL | Status: DC
  Administered 2016-10-08 – 2016-10-09 (×3): 1000 mg via ORAL
  Filled 2016-10-08 (×3): qty 2

## 2016-10-08 MED ORDER — ONDANSETRON HCL 4 MG/2ML IJ SOLN
INTRAMUSCULAR | Status: DC | PRN
  Administered 2016-10-08: 4 mg via INTRAVENOUS

## 2016-10-08 MED ORDER — FENTANYL CITRATE (PF) 100 MCG/2ML IJ SOLN
INTRAMUSCULAR | Status: AC
  Filled 2016-10-08: qty 2

## 2016-10-08 MED ORDER — DOCUSATE SODIUM 100 MG PO CAPS
100.0000 mg | ORAL_CAPSULE | Freq: Two times a day (BID) | ORAL | Status: DC
  Administered 2016-10-08 – 2016-10-09 (×2): 100 mg via ORAL
  Filled 2016-10-08 (×2): qty 1

## 2016-10-08 MED ORDER — VITAMIN D 1000 UNITS PO TABS
1000.0000 [IU] | ORAL_TABLET | Freq: Every day | ORAL | Status: DC
  Administered 2016-10-08 – 2016-10-09 (×2): 1000 [IU] via ORAL
  Filled 2016-10-08 (×2): qty 1

## 2016-10-08 MED ORDER — ONDANSETRON HCL 4 MG/2ML IJ SOLN: 4.0000 mg | Freq: Once | INTRAMUSCULAR | Status: DC | PRN

## 2016-10-08 MED ORDER — MIDAZOLAM HCL 2 MG/2ML IJ SOLN
INTRAMUSCULAR | Status: DC | PRN
  Administered 2016-10-08: 2 mg via INTRAVENOUS

## 2016-10-08 MED ORDER — BISACODYL 10 MG RE SUPP: 10.0000 mg | Freq: Every day | RECTAL | Status: DC | PRN

## 2016-10-08 MED ORDER — BUPIVACAINE HCL 0.5 % IJ SOLN
INTRAMUSCULAR | Status: DC | PRN
  Administered 2016-10-08: 20 mL

## 2016-10-08 MED ORDER — COENZYME Q10 100 MG PO CAPS: 100.0000 mg | ORAL_CAPSULE | Freq: Every day | ORAL | Status: DC

## 2016-10-08 MED ORDER — ACETAMINOPHEN 650 MG RE SUPP: 650.0000 mg | Freq: Four times a day (QID) | RECTAL | Status: DC | PRN

## 2016-10-08 MED ORDER — MAGNESIUM HYDROXIDE 400 MG/5ML PO SUSP: 30.0000 mL | Freq: Every day | ORAL | Status: DC | PRN

## 2016-10-08 MED ORDER — LIDOCAINE HCL (PF) 2 % IJ SOLN
INTRAMUSCULAR | Status: AC
Start: 2016-10-08 — End: 2016-10-08
  Filled 2016-10-08: qty 2

## 2016-10-08 MED ORDER — HYDROCODONE-ACETAMINOPHEN 7.5-325 MG PO TABS
1.0000 | ORAL_TABLET | Freq: Four times a day (QID) | ORAL | Status: DC | PRN
  Administered 2016-10-08 – 2016-10-09 (×2): 1 via ORAL
  Filled 2016-10-08 (×2): qty 1

## 2016-10-08 MED ORDER — OXYCODONE HCL 5 MG PO TABS: 5.0000 mg | ORAL_TABLET | ORAL | Status: DC | PRN

## 2016-10-08 MED ORDER — ACETAMINOPHEN 10 MG/ML IV SOLN
INTRAVENOUS | Status: DC | PRN
  Administered 2016-10-08: 1000 mg via INTRAVENOUS

## 2016-10-08 MED ORDER — LIDOCAINE HCL (CARDIAC) 20 MG/ML IV SOLN
INTRAVENOUS | Status: DC | PRN
  Administered 2016-10-08: 80 mg via INTRAVENOUS

## 2016-10-08 MED ORDER — ENOXAPARIN SODIUM 40 MG/0.4ML ~~LOC~~ SOLN
40.0000 mg | SUBCUTANEOUS | Status: DC
  Administered 2016-10-09: 40 mg via SUBCUTANEOUS
  Filled 2016-10-08: qty 0.4

## 2016-10-08 MED ORDER — METOCLOPRAMIDE HCL 5 MG/ML IJ SOLN: 5.0000 mg | Freq: Three times a day (TID) | INTRAMUSCULAR | Status: DC | PRN

## 2016-10-08 MED ORDER — METOCLOPRAMIDE HCL 10 MG PO TABS: 5.0000 mg | ORAL_TABLET | Freq: Three times a day (TID) | ORAL | Status: DC | PRN

## 2016-10-08 MED ORDER — SUCCINYLCHOLINE CHLORIDE 20 MG/ML IJ SOLN
INTRAMUSCULAR | Status: DC | PRN
  Administered 2016-10-08: 80 mg via INTRAVENOUS

## 2016-10-08 SURGICAL SUPPLY — 61 items
BANDAGE ACE 4X5 VEL STRL LF (GAUZE/BANDAGES/DRESSINGS) ×2 IMPLANT
BANDAGE ACE 6X5 VEL STRL LF (GAUZE/BANDAGES/DRESSINGS) ×2 IMPLANT
BIT DRILL 2.5X2.75 QC CALB (BIT) ×2 IMPLANT
BIT DRILL 2.9 CANN QC NONSTRL (BIT) ×2 IMPLANT
BIT DRILL 3.5X5.5 QC CALB (BIT) ×2 IMPLANT
BIT DRILL CALIBRATED 2.7 (BIT) ×2 IMPLANT
BLADE SURG SZ10 CARB STEEL (BLADE) ×4 IMPLANT
BNDG COHESIVE 4X5 TAN STRL (GAUZE/BANDAGES/DRESSINGS) ×2 IMPLANT
BNDG ESMARK 6X12 TAN STRL LF (GAUZE/BANDAGES/DRESSINGS) ×2 IMPLANT
BNDG PLASTER FAST 4X5 WHT LF (CAST SUPPLIES) ×2 IMPLANT
CANISTER SUCT 1200ML W/VALVE (MISCELLANEOUS) ×2 IMPLANT
CHLORAPREP W/TINT 26ML (MISCELLANEOUS) ×4 IMPLANT
COVER LIGHT HANDLE STERIS (MISCELLANEOUS) ×4 IMPLANT
CUFF TOURN 24 STER (MISCELLANEOUS) IMPLANT
CUFF TOURN 30 STER DUAL PORT (MISCELLANEOUS) IMPLANT
CUFF TOURN SGL QUICK 18 (TOURNIQUET CUFF) ×2 IMPLANT
DRAPE C-ARM XRAY 36X54 (DRAPES) ×2 IMPLANT
DRAPE C-ARMOR (DRAPES) ×2 IMPLANT
DRAPE INCISE IOBAN 66X45 STRL (DRAPES) ×2 IMPLANT
DRAPE U-SHAPE 47X51 STRL (DRAPES) ×2 IMPLANT
ELECT CAUTERY BLADE 6.4 (BLADE) ×2 IMPLANT
ELECT REM PT RETURN 9FT ADLT (ELECTROSURGICAL) ×2
ELECTRODE REM PT RTRN 9FT ADLT (ELECTROSURGICAL) ×1 IMPLANT
GAUZE PETRO XEROFOAM 1X8 (MISCELLANEOUS) ×2 IMPLANT
GAUZE SPONGE 4X4 12PLY STRL (GAUZE/BANDAGES/DRESSINGS) ×2 IMPLANT
GAUZE XEROFORM 4X4 STRL (GAUZE/BANDAGES/DRESSINGS) ×2 IMPLANT
GLOVE BIO SURGEON STRL SZ8 (GLOVE) ×4 IMPLANT
GLOVE INDICATOR 8.0 STRL GRN (GLOVE) ×2 IMPLANT
GOWN STRL REUS W/ TWL LRG LVL3 (GOWN DISPOSABLE) ×1 IMPLANT
GOWN STRL REUS W/ TWL XL LVL3 (GOWN DISPOSABLE) ×1 IMPLANT
GOWN STRL REUS W/TWL LRG LVL3 (GOWN DISPOSABLE) ×1
GOWN STRL REUS W/TWL XL LVL3 (GOWN DISPOSABLE) ×1
HEMOVAC 400ML (MISCELLANEOUS) ×2
K-WIRE ACE 1.6X6 (WIRE) ×4
KIT DRAIN HEMOVAC JP 7FR 400ML (MISCELLANEOUS) ×1 IMPLANT
KIT RM TURNOVER STRD PROC AR (KITS) ×2 IMPLANT
KWIRE ACE 1.6X6 (WIRE) ×2 IMPLANT
LABEL OR SOLS (LABEL) ×2 IMPLANT
NS IRRIG 1000ML POUR BTL (IV SOLUTION) ×2 IMPLANT
PACK EXTREMITY ARMC (MISCELLANEOUS) ×2 IMPLANT
PAD ABD DERMACEA PRESS 5X9 (GAUZE/BANDAGES/DRESSINGS) ×2 IMPLANT
PAD CAST CTTN 4X4 STRL (SOFTGOODS) ×1 IMPLANT
PAD PREP 24X41 OB/GYN DISP (PERSONAL CARE ITEMS) ×2 IMPLANT
PADDING CAST COTTON 4X4 STRL (SOFTGOODS) ×1
PLATE LOCK 7H 92 BILAT FIB (Plate) ×2 IMPLANT
SCREW ACE CAN 4.0 40M (Screw) ×4 IMPLANT
SCREW CORTICAL 3.5MM  20MM (Screw) ×1 IMPLANT
SCREW CORTICAL 3.5MM 18MM (Screw) ×2 IMPLANT
SCREW CORTICAL 3.5MM 20MM (Screw) ×1 IMPLANT
SCREW LOCK CORT STAR 3.5X10 (Screw) ×2 IMPLANT
SCREW LOCK CORT STAR 3.5X12 (Screw) ×6 IMPLANT
SCREW LOCK CORT STAR 3.5X14 (Screw) ×2 IMPLANT
SCREW LOW PROFILE 12MMX3.5MM (Screw) ×6 IMPLANT
SCREW NON LOCKING LP 3.5 14MM (Screw) ×2 IMPLANT
SPONGE LAP 18X18 5 PK (GAUZE/BANDAGES/DRESSINGS) ×2 IMPLANT
STAPLER SKIN PROX 35W (STAPLE) ×2 IMPLANT
STOCKINETTE IMPERV 14X48 (MISCELLANEOUS) ×2 IMPLANT
SUT VIC AB 0 CT1 36 (SUTURE) ×2 IMPLANT
SUT VIC AB 2-0 SH 27 (SUTURE) ×2
SUT VIC AB 2-0 SH 27XBRD (SUTURE) ×2 IMPLANT
SYRINGE 10CC LL (SYRINGE) ×2 IMPLANT

## 2016-10-08 NOTE — Anesthesia Preprocedure Evaluation (Addendum)
Anesthesia Evaluation  Patient identified by MRN, date of birth, ID band Patient awake    Reviewed: Allergy & Precautions, H&P , NPO status , Patient's Chart, lab work & pertinent test results, reviewed documented beta blocker date and time   History of Anesthesia Complications Negative for: history of anesthetic complications  Airway Mallampati: III  TM Distance: >3 FB Neck ROM: full    Dental  (+) Poor Dentition, Missing, Chipped, Dental Advidsory Given   Pulmonary neg pulmonary ROS, former smoker,           Cardiovascular Exercise Tolerance: Good hypertension, + CAD, + Past MI, + Cardiac Stents, + CABG and + Peripheral Vascular Disease  (-) dysrhythmias (-) Valvular Problems/Murmurs     Neuro/Psych PSYCHIATRIC DISORDERS (Anxiety) negative neurological ROS     GI/Hepatic Neg liver ROS, GERD  ,  Endo/Other  negative endocrine ROS  Renal/GU negative Renal ROS  negative genitourinary   Musculoskeletal   Abdominal   Peds  Hematology negative hematology ROS (+)   Anesthesia Other Findings Past Medical History: 2011: Coronary artery disease     Comment: CABG x 3 No date: Hyperlipidemia No date: Hypertension No date: Macular degeneration No date: MI (myocardial infarction) ([place])  Patient admits to drinking coffee with cream this morning, but Dr. Roland Rack has stated that this is an emergency and we need to proceed.  We will do so.  Reproductive/Obstetrics negative OB ROS                            Anesthesia Physical Anesthesia Plan  ASA: III and emergent  Anesthesia Plan: General, Rapid Sequence and Cricoid Pressure   Post-op Pain Management:    Induction: Intravenous, Rapid sequence and Cricoid pressure planned  PONV Risk Score and Plan: 3 and Ondansetron, Dexamethasone and Midazolam  Airway Management Planned: Oral ETT  Additional Equipment:   Intra-op Plan:   Post-operative  Plan: Extubation in OR  Informed Consent: I have reviewed the patients History and Physical, chart, labs and discussed the procedure including the risks, benefits and alternatives for the proposed anesthesia with the patient or authorized representative who has indicated his/her understanding and acceptance.   Dental Advisory Given  Plan Discussed with: Anesthesiologist, CRNA and Surgeon  Anesthesia Plan Comments:        Anesthesia Quick Evaluation

## 2016-10-08 NOTE — Anesthesia Post-op Follow-up Note (Cosign Needed)
Anesthesia QCDR form completed.        

## 2016-10-08 NOTE — Op Note (Signed)
10/08/2016  5:21 PM  Patient:   [name]  Pre-Op Diagnosis:   Closed displaced trimalleolar fracture, right ankle.  Post-Op Diagnosis:   Same.  Procedure:   Open reduction and internal fixation of closed displaced bimalleolar fracture, right ankle.  Surgeon:   Pascal Lux, MD  Assistant:   None  Anesthesia:   GET  Findings:   As above.  Complications:   None  EBL:   50 cc  Fluids:   800 cc crystalloid  UOP:   None  TT:   91 min at 250 mmHg  Drains:   None  Closure:   Staples  Implants:   Biomet composite 7-hole plate  Brief Clinical Note:   The patient is a 71 year old female who sustained the above-noted injury 4 days ago while on a trip in [place]. She apparently slipped on a manhole cover and injured her ankle. She was brought to the local emergency room where she underwent a closed reduction and splinting of her right ankle fracture dislocation. However, she elected not to proceed with surgical intervention in the ER but wanted to wait until she got home to [place]. The patient was seen in the office this morning where x-rays demonstrated loss of reduction of her mortise. The patient presents at this time for definitive management of her injury.  Procedure:   The patient was brought into the operating room and lain in the supine position. After adequate general endotracheal intubation and anesthesia was obtained, the patient's right foot and lower leg were prepped with ChloraPrep solution, then draped sterilely. Preoperative antibiotics were administered. A timeout was performed to verify the appropriate surgical site before the limb was exsanguinated with an Esmarch and the calf tourniquet inflated to 250 mmHg. Laterally, a 6-7 cm incision was made over the lateral aspect of the distal fibula. The incision was carried down through the subcutaneous tissues to expose the fracture site. The fracture hematoma was debrided before the fracture was reduced and  temporarily secured using a bone clamp. Two lag screws were placed in an anterior to posterior direction perpendicular to the fracture. A 7-hole composite plate was applied over the lateral aspect of the distal fibula. After verifying its position fluoroscopically, it was slightly contoured before being secured using a 3.5 mm nonlocking cortical screw proximal to the fracture. Again the plate's position was adjusted slightly based on AP and lateral projections before it was secured using additional bicortical screws proximally and multiple locking screws distally. The adequacy of fracture reduction and hardware position was verified fluoroscopically in AP and lateral projections and found to be excellent. Several screws were removed and replaced with shorter screws as they appeared to be too long.  Attention was directed to the medial side. An approximately 3-3.5 cm longitudinal incision was made over the anterior and distal portions of the medial malleolus. This incision also was carried down through the subcutaneous tissues to expose the fracture site. Care was taken to identify and protect the saphenous nerve and vein. The fracture hematoma again was removed before the fracture was reduced. Two guidewires were placed obliquely across the fracture from distal to proximal into the distal tibial metaphysis. After verifying their positions fluoroscopically, each guidewire was sequentially over-reamed and replaced with a 40 mm partially threaded 4.0 cancellous screw in lag fashion. Again the adequacy of fracture reduction, hardware position, and mortise restoration was verified in AP, lateral, and oblique projections and found to be excellent.  Each wound was  copiously irrigated with sterile saline solution. Laterally, the subcutaneous tissues were closed in two layers using 2-0 Vicryl interrupted sutures before the skin was closed using staples. Medially, the subcutaneous tissues were closed using 2-0 Vicryl  interrupted sutures before the skin was closed using staples. A total of 20 cc of 0.5% plain Sensorcaine was injected in and around the incision sites to help with postoperative analgesia. Sterile bulky dressings were applied to the wounds before the patient was placed into a posterior splint with a sugar tong supplement, maintaining the ankle in neutral dorsiflexion. The patient was then awakened and returned to the recovery room in satisfactory condition after tolerating the procedure well.

## 2016-10-08 NOTE — Anesthesia Postprocedure Evaluation (Signed)
Anesthesia Post Note  Patient: [name]  Procedure(s) Performed: Procedure(s) (LRB): OPEN REDUCTION INTERNAL FIXATION (ORIF) ANKLE FRACTURE (Right)  Patient location during evaluation: PACU Anesthesia Type: General Level of consciousness: awake and alert Pain management: pain level controlled Vital Signs Assessment: post-procedure vital signs reviewed and stable Respiratory status: spontaneous breathing and respiratory function stable Cardiovascular status: stable Anesthetic complications: no     Last Vitals:  Vitals:   10/08/16 1800 10/08/16 1805  BP: 126/77   Pulse: 95 97  Resp: 13 (!) 24  Temp: 36.7 C     Last Pain:  Vitals:   10/08/16 1326  TempSrc: Oral  PainSc: 5                  Omelia Marquart K

## 2016-10-08 NOTE — Progress Notes (Addendum)
PHARMACIST - PHYSICIAN ORDER COMMUNICATION  CONCERNING: P&T Medication Policy on Herbal Medications  DESCRIPTION:  This patient's order for:  Krill oil, tumeric, coQ10  has been noted.  This product(s) is classified as an "herbal" or natural product. Due to a lack of definitive safety studies or FDA approval, nonstandard manufacturing practices, plus the potential risk of unknown drug-drug interactions while on inpatient medications, the Pharmacy and Therapeutics Committee does not permit the use of "herbal" or natural products of this type within [facility].   ACTION TAKEN: The pharmacy department is unable to verify this order at this time and your patient has been informed of this safety policy. Please reevaluate patient's clinical condition at discharge and address if the herbal or natural product(s) should be resumed at that time.

## 2016-10-08 NOTE — H&P (Signed)
Paper H&P to be scanned into permanent record. H&P reviewed and patient re-examined. No changes.  I feel that this case is an emergency as the ankle has subluxed out of place and potentially is compromising the skin.

## 2016-10-08 NOTE — Anesthesia Procedure Notes (Signed)
Procedure Name: Intubation Performed by: Demetrius Charity Pre-anesthesia Checklist: Patient identified, Patient being monitored, Timeout performed, Emergency Drugs available and Suction available Patient Re-evaluated:Patient Re-evaluated prior to inductionOxygen Delivery Method: Circle system utilized Preoxygenation: Pre-oxygenation with 100% oxygen Intubation Type: IV induction, Rapid sequence and Cricoid Pressure applied Ventilation: Mask ventilation without difficulty Laryngoscope Size: Mac and 3 Grade View: Grade II Tube type: Oral Tube size: 7.0 mm Number of attempts: 1 Airway Equipment and Method: Stylet Placement Confirmation: ETT inserted through vocal cords under direct vision,  positive ETCO2 and breath sounds checked- equal and bilateral Secured at: 21 cm Tube secured with: Tape Dental Injury: Teeth and Oropharynx as per pre-operative assessment

## 2016-10-08 NOTE — Transfer of Care (Signed)
Immediate Anesthesia Transfer of Care Note  Patient: [name]  Procedure(s) Performed: Procedure(s): OPEN REDUCTION INTERNAL FIXATION (ORIF) ANKLE FRACTURE (Right)  Patient Location: PACU  Anesthesia Type:General  Level of Consciousness: awake, alert  and oriented  Airway & Oxygen Therapy: Patient Spontanous Breathing and Patient connected to face mask oxygen  Post-op Assessment: Report given to RN and Post -op Vital signs reviewed and stable  Post vital signs: Reviewed and stable  Last Vitals:  Vitals:   10/08/16 1326  BP: 140/60  Pulse: 86  Resp: 18  Temp: 36.7 C    Last Pain:  Vitals:   10/08/16 1326  TempSrc: Oral  PainSc: 5          Complications: No apparent anesthesia complications

## 2016-10-09 ENCOUNTER — Encounter
Admission: RE | Admit: 2016-10-09 | Discharge: 2016-10-09 | Disposition: A | Discharge status: Home / Self Care | Payer: Medicare HMO | Source: Ambulatory Visit | Attending: Internal Medicine | Admitting: Internal Medicine

## 2016-10-09 ENCOUNTER — Encounter: Payer: Self-pay | Admitting: Surgery

## 2016-10-09 ENCOUNTER — Other Ambulatory Visit
Admission: RE | Admit: 2016-10-09 | Discharge: 2016-10-09 | Disposition: A | Discharge status: Home / Self Care | Payer: Medicare HMO | Source: Skilled Nursing Facility | Attending: Internal Medicine | Admitting: Internal Medicine

## 2016-10-09 DIAGNOSIS — R69 Illness, unspecified: Secondary | ICD-10-CM | POA: Diagnosis not present

## 2016-10-09 DIAGNOSIS — S82851D Displaced trimalleolar fracture of right lower leg, subsequent encounter for closed fracture with routine healing: Secondary | ICD-10-CM | POA: Diagnosis not present

## 2016-10-09 DIAGNOSIS — R262 Difficulty in walking, not elsewhere classified: Secondary | ICD-10-CM | POA: Diagnosis not present

## 2016-10-09 DIAGNOSIS — M8000XD Age-related osteoporosis with current pathological fracture, unspecified site, subsequent encounter for fracture with routine healing: Secondary | ICD-10-CM | POA: Diagnosis not present

## 2016-10-09 DIAGNOSIS — H35033 Hypertensive retinopathy, bilateral: Secondary | ICD-10-CM | POA: Diagnosis not present

## 2016-10-09 DIAGNOSIS — M5136 Other intervertebral disc degeneration, lumbar region: Secondary | ICD-10-CM | POA: Diagnosis not present

## 2016-10-09 DIAGNOSIS — Z9181 History of falling: Secondary | ICD-10-CM | POA: Diagnosis not present

## 2016-10-09 DIAGNOSIS — X58XXXA Exposure to other specified factors, initial encounter: Secondary | ICD-10-CM | POA: Insufficient documentation

## 2016-10-09 DIAGNOSIS — H353112 Nonexudative age-related macular degeneration, right eye, intermediate dry stage: Secondary | ICD-10-CM | POA: Diagnosis not present

## 2016-10-09 DIAGNOSIS — I739 Peripheral vascular disease, unspecified: Secondary | ICD-10-CM | POA: Diagnosis not present

## 2016-10-09 DIAGNOSIS — Z01812 Encounter for preprocedural laboratory examination: Secondary | ICD-10-CM | POA: Diagnosis present

## 2016-10-09 DIAGNOSIS — S82891A Other fracture of right lower leg, initial encounter for closed fracture: Secondary | ICD-10-CM | POA: Diagnosis not present

## 2016-10-09 DIAGNOSIS — M810 Age-related osteoporosis without current pathological fracture: Secondary | ICD-10-CM | POA: Diagnosis not present

## 2016-10-09 DIAGNOSIS — S82851A Displaced trimalleolar fracture of right lower leg, initial encounter for closed fracture: Secondary | ICD-10-CM | POA: Diagnosis not present

## 2016-10-09 DIAGNOSIS — Z7401 Bed confinement status: Secondary | ICD-10-CM | POA: Diagnosis not present

## 2016-10-09 DIAGNOSIS — I2510 Atherosclerotic heart disease of native coronary artery without angina pectoris: Secondary | ICD-10-CM | POA: Diagnosis not present

## 2016-10-09 DIAGNOSIS — I10 Essential (primary) hypertension: Secondary | ICD-10-CM | POA: Diagnosis not present

## 2016-10-09 DIAGNOSIS — I252 Old myocardial infarction: Secondary | ICD-10-CM | POA: Diagnosis not present

## 2016-10-09 DIAGNOSIS — H353221 Exudative age-related macular degeneration, left eye, with active choroidal neovascularization: Secondary | ICD-10-CM | POA: Diagnosis not present

## 2016-10-09 DIAGNOSIS — M25571 Pain in right ankle and joints of right foot: Secondary | ICD-10-CM | POA: Diagnosis not present

## 2016-10-09 DIAGNOSIS — H43813 Vitreous degeneration, bilateral: Secondary | ICD-10-CM | POA: Diagnosis not present

## 2016-10-09 DIAGNOSIS — M6281 Muscle weakness (generalized): Secondary | ICD-10-CM | POA: Diagnosis not present

## 2016-10-09 DIAGNOSIS — H3530 Unspecified macular degeneration: Secondary | ICD-10-CM | POA: Diagnosis not present

## 2016-10-09 LAB — BASIC METABOLIC PANEL
Anion gap: 5 (ref 5–15)
BUN: 16 mg/dL (ref 6–20)
CALCIUM: 9.4 mg/dL (ref 8.9–10.3)
CO2: 26 mmol/L (ref 22–32)
Chloride: 102 mmol/L (ref 101–111)
Creatinine, Ser: 0.76 mg/dL (ref 0.44–1.00)
GFR calc Af Amer: >60 mL/min (ref >60)
Glucose, Bld: 128 mg/dL — ABNORMAL HIGH (ref 65–99)
POTASSIUM: 4.2 mmol/L (ref 3.5–5.1)
SODIUM: 133 mmol/L — AB (ref 135–145)

## 2016-10-09 LAB — URINALYSIS, COMPLETE (UACMP) WITH MICROSCOPIC
Bacteria, UA: NONE SEEN
Bilirubin Urine: NEGATIVE
GLUCOSE, UA: 150 mg/dL — AB
HGB URINE DIPSTICK: NEGATIVE
Ketones, ur: 5 mg/dL — AB
NITRITE: NEGATIVE
PH: 5 (ref 5.0–8.0)
Protein, ur: NEGATIVE mg/dL
SPECIFIC GRAVITY, URINE: 1.031 — AB (ref 1.005–1.030)

## 2016-10-09 MED ORDER — ENOXAPARIN SODIUM 40 MG/0.4ML ~~LOC~~ SOLN: 40.0000 mg | SUBCUTANEOUS | 0 refills | Status: DC

## 2016-10-09 MED ORDER — HYDROCODONE-ACETAMINOPHEN 7.5-325 MG PO TABS: 1.0000 | ORAL_TABLET | Freq: Four times a day (QID) | ORAL | 0 refills | Status: DC | PRN

## 2016-10-09 NOTE — Progress Notes (Signed)
  Subjective: 1 Day Post-Op Procedure(s) (LRB): OPEN REDUCTION INTERNAL FIXATION (ORIF) ANKLE FRACTURE (Right) Patient reports pain as mild.   Patient seen in rounds with Dr. Roland Rack. Patient is well, and has had no acute complaints or problems Plan is to go Rehab after hospital stay. Negative for chest pain and shortness of breath Fever: no Gastrointestinal: Negative for nausea and vomiting  Objective: Vital signs in last 24 hours: Temp:  [97.4 F (36.3 C)-98.9 F (37.2 C)] 98.9 F (37.2 C) (06/22 0548) Pulse Rate:  [84-123] 84 (06/22 0548) Resp:  [13-26] 19 (06/22 0548) BP: (110-147)/(44-77) 116/51 (06/22 0548) SpO2:  [92 %-100 %] 93 % (06/22 0548) Weight:  [68.9 kg (152 lb)-71.6 kg (157 lb 12.8 oz)] 71.6 kg (157 lb 12.8 oz) (06/21 1828)  Intake/Output from previous day:  Intake/Output Summary (Last 24 hours) at 10/09/16 0732 Last data filed at 10/09/16 0500  Gross per 24 hour  Intake             1490 ml  Output                2 ml  Net             1488 ml    Intake/Output this shift: No intake/output data recorded.  Labs: No results for input(s): HGB in the last 72 hours. No results for input(s): WBC, RBC, HCT, PLT in the last 72 hours.  Recent Labs  10/09/16 0544  NA 133*  K 4.2  CL 102  CO2 26  BUN 16  CREATININE 0.76  GLUCOSE 128*  CALCIUM 9.4   No results for input(s): LABPT, INR in the last 72 hours.   EXAM General - Patient is Alert and Oriented Extremity - Neurovascular intact No cellulitis present Compartment soft Dressing/Incision - clean, dry, no drainage Motor Function - intact, moving foot and toes well on exam.   Past Medical History:  Diagnosis Date  . Coronary artery disease 2011   CABG x 3  . Hyperlipidemia   . Hypertension   . Macular degeneration   . MI (myocardial infarction) ([place])     Assessment/Plan: 1 Day Post-Op Procedure(s) (LRB): OPEN REDUCTION INTERNAL FIXATION (ORIF) ANKLE FRACTURE (Right) Active Problems:  Closed fracture of ankle, trimalleolar  Estimated body mass index is 30.82 kg/m as calculated from the following:   Height as of this encounter: 5' (1.524 m).   Weight as of this encounter: 71.6 kg (157 lb 12.8 oz). Advance diet Up with therapy D/C IV fluids Discharge to SNF when bed offers available.  DVT Prophylaxis - Lovenox None Weight-Bearingto right leg  Reche Dixon, PA-C Orthopaedic Surgery 10/09/2016, 7:32 AM

## 2016-10-09 NOTE — Care Management Important Message (Signed)
Important Message  Patient Details  Name: [name] MRN: [id number] Date of Birth: [birth year]   Medicare Important Message Given:  N/A - LOS <3 / Initial given by admissions    Jolly Mango, RN 10/09/2016, 11:58 AM

## 2016-10-09 NOTE — Evaluation (Addendum)
Physical Therapy Evaluation Patient Details Name: [name] MRN: [id number] DOB: 10-04-1945 Today's Date: 10/09/2016   History of Present Illness  71 y/o female who reports slipping on man-hole cover, suffered R ankle trimalleolar fx and underwent ORIF 10/08/16.  Clinical Impression  Pt showed good effort with PT exam but was very limited with any standing activities.  She was able to maintain R NWBing but ultimately was very unsteady and unable to use UEs effectively to lift herself to hop L LE even an inch.  Pt unsafe and needing assist with heel-toe shuffling to get to recliner.  Pt entirely unsafe to go home despite wish to do so, STR is the only safe option at this time.     Follow Up Recommendations STR   Equipment Recommendations  Rolling walker with 5" wheels    Recommendations for Other Services       Precautions / Restrictions Precautions Precautions: Fall Restrictions Weight Bearing Restrictions: Yes RLE Weight Bearing: Non weight bearing      Mobility  Bed Mobility Overal bed mobility: Needs Assistance Bed Mobility: Supine to Sit     Supine to sit: Min guard     General bed mobility comments: light cuing to safely get to EOB, able to do so w/o physical assist  Transfers Overall transfer level: Needs assistance Equipment used: Rolling walker (2 wheeled) Transfers: Sit to/from Stand Sit to Stand: Min assist         General transfer comment: Pt showed good effort in getting to standing and used UEs well but did need min assist to safely rise to upright, highly reliant on UEs to maintain balance  Ambulation/Gait             General Gait Details: Pt completely unable to lift L foot off the ground even with considerable assist from PT to help unweight.  She was able to maintain NWBing on the R but could not do more than very guarded and unsafe heel-toe shuffling to get to the recliner   Stairs            Wheelchair Mobility    Modified  Rankin (Stroke Patients Only)       Balance Overall balance assessment: Needs assistance Sitting-balance support: No upper extremity supported Sitting balance-Leahy Scale: Good     Standing balance support: Bilateral upper extremity supported Standing balance-Leahy Scale: Poor Standing balance comment: Pt statically able to maintain standing but did poorly with even minimal dynamic movement                             Pertinent Vitals/Pain Pain Assessment: 0-10 Pain Score: 4  Pain Location: R ankle    Home Living Family/patient expects to be discharged to:: Skilled nursing facility Living Arrangements: Alone Available Help at Discharge:  (minimal) Type of Home: House Home Access: Stairs to enter Entrance Stairs-Rails: Can reach both Entrance Stairs-Number of Steps: 5          Prior Function Level of Independence: Independent         Comments: Pt working (desk job), 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     Hand Dominance        Extremity/Trunk Assessment   Upper Extremity Assessment Upper Extremity Assessment: Overall WFL for tasks assessed    Lower Extremity Assessment Lower Extremity Assessment: Generalized weakness (grossly 4-/5 t/o, R ankle and R WBing tasks not tested)  Communication   Communication: No difficulties  Cognition Arousal/Alertness: Awake/alert Behavior During Therapy: WFL for tasks assessed/performed Overall Cognitive Status: Within Functional Limits for tasks assessed                                        General Comments      Exercises     Assessment/Plan    PT Assessment Patient needs continued PT services  PT Problem List Decreased strength;Decreased range of motion;Decreased activity tolerance;Decreased balance;Decreased mobility;Decreased coordination;Decreased cognition;Decreased safety awareness;Decreased knowledge of use of DME;Pain       PT Treatment Interventions  DME instruction;Gait training;Functional mobility training;Stair training;Therapeutic activities;Therapeutic exercise;Balance training;Cognitive remediation;Patient/family education    PT Goals (Current goals can be found in the Care Plan section)  Acute Rehab PT Goals Patient Stated Goal: Pt wanted to go home but realizes she is not safe to do so PT Goal Formulation: With patient Time For Goal Achievement: 10/23/16 Potential to Achieve Goals: Fair    Frequency BID   Barriers to discharge        Co-evaluation               AM-PAC PT "6 Clicks" Daily Activity  Outcome Measure Difficulty turning over in bed (including adjusting bedclothes, sheets and blankets)?: A Little Difficulty moving from lying on back to sitting on the side of the bed? : A Little Difficulty sitting down on and standing up from a chair with arms (e.g., wheelchair, bedside commode, etc,.)?: A Little Help needed moving to and from a bed to chair (including a wheelchair)?: Total Help needed walking in hospital room?: Total Help needed climbing 3-5 steps with a railing? : Total 6 Click Score: 12    End of Session Equipment Utilized During Treatment: Gait belt Activity Tolerance: Patient limited by fatigue;Patient limited by pain Patient left: with chair alarm set;with call bell/phone within reach Nurse Communication: Mobility status PT Visit Diagnosis: Muscle weakness (generalized) (M62.81);Difficulty in walking, not elsewhere classified (R26.2)    Time: 4496-7591 PT Time Calculation (min) (ACUTE ONLY): 29 min   Charges:   PT Evaluation $PT Eval Low Complexity: 1 Procedure     PT G Codes:        Kreg Shropshire, DPT 10/09/2016, 10:16 AM

## 2016-10-09 NOTE — Clinical Social Work Note (Signed)
Clinical Social Work Assessment  Patient Details  Name: [name] MRN: [id number] Date of Birth: 04/12/1946  Date of referral:  10/09/16               Reason for consult:  Facility Placement                Permission sought to share information with:  Chartered certified accountant granted to share information::  Yes, Verbal Permission Granted  Name::      [place]::   [place]   Relationship::     Contact Information:     Housing/Transportation Living arrangements for the past 2 months:  [place] of Information:  Patient Patient Interpreter Needed:  None Criminal Activity/Legal Involvement Pertinent to Current Situation/Hospitalization:  No - Comment as needed Significant Relationships:  Siblings, Friend Lives with:  Self Do you feel safe going back to the place where you live?  Yes Need for family participation in patient care:  Yes (Comment)  Care giving concerns:  Patient lives alone in [place].    Social Worker assessment / plan:  Holiday representative ([facility]) received SNF consult. PT is recommending SNF. CSW met with patient and her friend Ron was at bedside. Patient was alert and oriented X4 and was sitting up in the chair at bedside. CSW introduced self and explained role of CSW department. Patient reported that she lives alone and is agreeable to SNF search in [facility] and prefers [facility] and is agreeable to a semi-private room. FL2 complete and faxed out.   CSW presented bed offers to patient and she chose [facility]. Per Maudie Mercury admissions coordinator at [facility] she will start [facility] authorization today and will accept a 5 day LOG. [place] approved 5 day LOG. Patient is aware that she will have to discharge from [facility] after 5 days if [facility] denies SNF. Patient is medically stable for D/C to [facility] today room 202-A. RN will call report at [phone] and arrange EMS for transport.  CSW sent D/C orders to [facility] via [facility]. Patient is aware of above and reported that she would notify her family. Please reconsult if future social work needs arise. CSW signing off.   Employment status:  Retired Nurse, adult PT Recommendations:  [place] / Referral to community resources:  [place]  Patient/Family's Response to care:  Patient is agreeable to D/C to [facility] today.   Patient/Family's Understanding of and Emotional Response to Diagnosis, Current Treatment, and Prognosis:  Patient was very pleasant and thanked CSW for assistance.   Emotional Assessment Appearance:  Appears stated age Attitude/Demeanor/Rapport:    Affect (typically observed):  Accepting, Adaptable, Pleasant Orientation:  Oriented to Self, Oriented to Place, Oriented to  Time, Oriented to Situation Alcohol / Substance use:  Not Applicable Psych involvement (Current and /or in the community):  No (Comment)  Discharge Needs  Concerns to be addressed:  Discharge Planning Concerns Readmission within the last 30 days:  No Current discharge risk:  Dependent with Mobility Barriers to Discharge:  Continued Medical Work up   [employer], Veronia Beets, LCSW 10/09/2016, 2:19 PM

## 2016-10-09 NOTE — Clinical Social Work Placement (Signed)
   CLINICAL SOCIAL WORK PLACEMENT  NOTE  Date:  10/09/2016  Patient Details  Name: [name] MRN: [id number] Date of Birth: [birth year]  Clinical Social Work is seeking post-discharge placement for this patient at the [place] level of care (*CSW will initial, date and re-position this form in  chart as items are completed):  Yes   Patient/family provided with [place] Work Department's list of facilities offering this level of care within the geographic area requested by the patient (or if unable, by the patient's family).  Yes   Patient/family informed of their freedom to choose among providers that offer the needed level of care, that participate in Medicare, Medicaid or managed care program needed by the patient, have an available bed and are willing to accept the patient.  Yes   Patient/family informed of [facility]'s ownership interest in [facility] and [facility], as well as of the fact that they are under no obligation to receive care at these facilities.  PASRR submitted to EDS on 10/09/16     PASRR number received on 10/09/16     Existing PASRR number confirmed on       FL2 transmitted to all facilities in geographic area requested by pt/family on 10/09/16     FL2 transmitted to all facilities within larger geographic area on       Patient informed that his/her managed care company has contracts with or will negotiate with certain facilities, including the following:        Yes   Patient/family informed of bed offers received.  Patient chooses bed at  [facility] )     Physician recommends and patient chooses bed at      Patient to be transferred to  [facility] ) on 10/09/16.  Patient to be transferred to facility by  [facility] )     Patient family notified on 10/09/16 of transfer.  Name of family member notified:   (Patient reported that she would notify her family of her D/C today. )      PHYSICIAN       Additional Comment:    _______________________________________________ Pheng Prokop, Veronia Beets, LCSW 10/09/2016, 2:18 PM

## 2016-10-09 NOTE — NC FL2 (Signed)
[place] LEVEL OF CARE SCREENING TOOL     IDENTIFICATION  Patient Name: [name] Birthdate: 1945/12/07 Sex: female Admission Date (Current Location): 10/08/2016  [place] and [place] Number:  Engineering geologist and Address:  [facility], [address], [place], [place] [address]      Provider Number: [id number]  Attending Physician Name and Address:  Corky Mull, MD  Relative Name and Phone Number:       Current Level of Care: Hospital Recommended Level of Care: [place] Prior Approval Number:    Date Approved/Denied:   PASRR Number:  [id number])  Discharge Plan: SNF    Current Diagnoses: Patient Active Problem List   Diagnosis Date Noted  . Closed fracture of ankle, trimalleolar 10/08/2016  . Abnormal Pap smear of vagina 05/05/2016  . Nausea without vomiting 03/26/2016  . Generalized anxiety disorder 01/21/2016  . Abdominal distension 01/21/2016  . Obesity 12/19/2015  . Hypercalcemia 10/03/2015  . Need for hepatitis C screening test 10/03/2015  . Encounter for routine adult medical exam with abnormal findings 01/17/2015  . Lipoma of arm 12/25/2014  . Macular degeneration 12/25/2014  . Medicare annual wellness visit, subsequent 01/04/2014  . Vaginal irritation 12/22/2013  . Osteoporosis 03/07/2013  . Chronic back pain 03/07/2013  . Weight gain 09/20/2012  . Essential hypertension, benign 05/31/2012  . Coronary artery disease 05/31/2012  . GERD (gastroesophageal reflux disease) 05/31/2012  . Allergic rhinitis 05/31/2012  . Primary hyperparathyroidism ([place]) 05/31/2012  . General medical exam 11/19/2011  . Neuropathy 11/19/2011  . PVD (peripheral vascular disease) ([place]) 07/30/2010  . Hyperlipidemia 03/26/2010  . CAD, AUTOLOGOUS BYPASS GRAFT 03/26/2010  . MULTI-VESSEL OR BILATERAL STENOSIS W/O INFARCTION 03/26/2010  . TOBACCO USE, QUIT 03/26/2010    Orientation RESPIRATION BLADDER Height &  Weight     Self, Time, Situation, Place  Normal Continent Weight: 157 lb 12.8 oz (71.6 kg) Height:  5' (152.4 cm)  BEHAVIORAL SYMPTOMS/MOOD NEUROLOGICAL BOWEL NUTRITION STATUS   (none)  (none) Continent Diet (Diet: Heart Healthy )  AMBULATORY STATUS COMMUNICATION OF NEEDS Skin   Extensive Assist Verbally Surgical wounds (Incision: Right Ankle )                       Personal Care Assistance Level of Assistance  Bathing, Feeding, Dressing Bathing Assistance: Limited assistance Feeding assistance: Independent Dressing Assistance: Limited assistance     Functional Limitations Info  Sight, Hearing, Speech Sight Info: Adequate Hearing Info: Adequate Speech Info: Adequate    SPECIAL CARE FACTORS FREQUENCY  PT (By licensed PT), OT (By licensed OT)     PT Frequency:  (5) OT Frequency:  (5)            Contractures      Additional Factors Info  Code Status, Allergies Code Status Info:  (Full Code. ) Allergies Info:  (No Known Allergies. )           Current Medications (10/09/2016):  This is the current hospital active medication list Current Facility-Administered Medications  Medication Dose Route Frequency Provider Last Rate Last Dose  . acetaminophen (TYLENOL) tablet 650 mg  650 mg Oral Q6H PRN Poggi, Marshall Cork, MD       Or  . acetaminophen (TYLENOL) suppository 650 mg  650 mg Rectal Q6H PRN Poggi, Marshall Cork, MD      . acetaminophen (TYLENOL) tablet 1,000 mg  1,000 mg Oral Q6H Poggi, Marshall Cork, MD   1,000  mg at 10/09/16 0555  . acidophilus (RISAQUAD) capsule   Oral Daily Poggi, Marshall Cork, MD   1 capsule at 10/09/16 0847  . aspirin EC tablet 81 mg  81 mg Oral QHS Corky Mull, MD   81 mg at 10/08/16 2216  . bisacodyl (DULCOLAX) suppository 10 mg  10 mg Rectal Daily PRN Poggi, Marshall Cork, MD      . cholecalciferol (VITAMIN D) tablet 1,000 Units  1,000 Units Oral Daily Poggi, Marshall Cork, MD   1,000 Units at 10/09/16 0844  . diphenhydrAMINE (BENADRYL) 12.5 MG/5ML elixir 12.5-25 mg   12.5-25 mg Oral Q4H PRN Poggi, Marshall Cork, MD      . docusate sodium (COLACE) capsule 100 mg  100 mg Oral BID Corky Mull, MD   100 mg at 10/09/16 0842  . enoxaparin (LOVENOX) injection 40 mg  40 mg Subcutaneous Q24H Poggi, Marshall Cork, MD   40 mg at 10/09/16 3559  . HYDROcodone-acetaminophen (NORCO) 7.5-325 MG per tablet 1 tablet  1 tablet Oral Q6H PRN Poggi, Marshall Cork, MD   1 tablet at 10/09/16 0841  . ketorolac (TORADOL) 15 MG/ML injection 15 mg  15 mg Intravenous Once Poggi, Marshall Cork, MD      . ketorolac (TORADOL) 15 MG/ML injection 7.5 mg  7.5 mg Intravenous Q6H Poggi, Marshall Cork, MD   7.5 mg at 10/09/16 0557  . magnesium hydroxide (MILK OF MAGNESIA) suspension 30 mL  30 mL Oral Daily PRN Poggi, Marshall Cork, MD      . metoCLOPramide (REGLAN) tablet 5-10 mg  5-10 mg Oral Q8H PRN Poggi, Marshall Cork, MD       Or  . metoCLOPramide (REGLAN) injection 5-10 mg  5-10 mg Intravenous Q8H PRN Poggi, Marshall Cork, MD      . metoprolol tartrate (LOPRESSOR) tablet 12.5 mg  12.5 mg Oral BID Poggi, Marshall Cork, MD   12.5 mg at 10/09/16 0845  . multivitamin-lutein (OCUVITE-LUTEIN) capsule   Oral BID Poggi, Marshall Cork, MD   1 capsule at 10/09/16 0847  . ondansetron (ZOFRAN) tablet 4 mg  4 mg Oral Q6H PRN Poggi, Marshall Cork, MD       Or  . ondansetron (ZOFRAN) injection 4 mg  4 mg Intravenous Q6H PRN Poggi, Marshall Cork, MD      . oxyCODONE (Oxy IR/ROXICODONE) immediate release tablet 5-10 mg  5-10 mg Oral Q3H PRN Poggi, Marshall Cork, MD      . pantoprazole (PROTONIX) EC tablet 40 mg  40 mg Oral Daily Poggi, Marshall Cork, MD   40 mg at 10/09/16 0905  . rosuvastatin (CRESTOR) tablet 40 mg  40 mg Oral Daily Poggi, Marshall Cork, MD   40 mg at 10/08/16 1951  . sertraline (ZOLOFT) tablet 100 mg  100 mg Oral QHS Poggi, Marshall Cork, MD   100 mg at 10/08/16 2216  . sodium phosphate (FLEET) 7-19 GM/118ML enema 1 enema  1 enema Rectal Once PRN Poggi, Marshall Cork, MD         Discharge Medications: Please see discharge summary for a list of discharge medications.  Relevant Imaging  Results:  Relevant Lab Results:   Additional Information  (SSN: [id number])  Yeng Frankie, Veronia Beets, LCSW

## 2016-10-09 NOTE — Discharge Instructions (Signed)
INSTRUCTIONS AFTER Surgery  o Remove items at home which could result in a fall. This includes throw rugs or furniture in walking pathways o ICE to the affected joint every three hours while awake for 30 minutes at a time, for at least the first 3-5 days, and then as needed for pain and swelling.  Continue to use ice for pain and swelling. You may notice swelling that will progress down to the foot and ankle.  This is normal after surgery.  Elevate your leg when you are not up walking on it.   o Continue to use the breathing machine you got in the hospital (incentive spirometer) which will help keep your temperature down.  It is common for your temperature to cycle up and down following surgery, especially at night when you are not up moving around and exerting yourself.  The breathing machine keeps your lungs expanded and your temperature down.   DIET:  As you were doing prior to hospitalization, we recommend a well-balanced diet.  DRESSING / WOUND CARE / SHOWERING  The patient will stay in her splint with no wound care.  Staples will be removed by Dr Roland Rack in 10 days.  ACTIVITY  o Increase activity slowly as tolerated, but follow the weight bearing instructions below.   o No driving for 6 weeks or until further direction given by your physician.  You cannot drive while taking narcotics.  o No lifting or carrying greater than 10 lbs. until further directed by your surgeon. o Avoid periods of inactivity such as sitting longer than an hour when not asleep. This helps prevent blood clots.  o You may return to work once you are authorized by your doctor.     WEIGHT BEARING  Non-Weight bearing on Right   EXERCISES Ambulation with Walker and non weight-bearing on Right.  CONSTIPATION  Constipation is defined medically as fewer than three stools per week and severe constipation as less than one stool per week.  Even if you have a regular bowel pattern at home, your normal regimen is likely  to be disrupted due to multiple reasons following surgery.  Combination of anesthesia, postoperative narcotics, change in appetite and fluid intake all can affect your bowels.   YOU MUST use at least one of the following options; they are listed in order of increasing strength to get the job done.  They are all available over the counter, and you may need to use some, POSSIBLY even all of these options:    Drink plenty of fluids (prune juice may be helpful) and high fiber foods Colace 100 mg by mouth twice a day  Senokot for constipation as directed and as needed Dulcolax (bisacodyl), take with full glass of water  Miralax (polyethylene glycol) once or twice a day as needed.  If you have tried all these things and are unable to have a bowel movement in the first 3-4 days after surgery call either your surgeon or your primary doctor.    If you experience loose stools or diarrhea, hold the medications until you stool forms back up.  If your symptoms do not get better within 1 week or if they get worse, check with your doctor.  If you experience "the worst abdominal pain ever" or develop nausea or vomiting, please contact the office immediately for further recommendations for treatment.   ITCHING:  If you experience itching with your medications, try taking only a single pain pill, or even half a pain pill at a  time.  You can also use Benadryl over the counter for itching or also to help with sleep.   TED HOSE STOCKINGS:  Use stockings on both legs until for at least 2 weeks or as directed by physician office. They may be removed at night for sleeping.  MEDICATIONS:  See your medication summary on the After Visit Summary that nursing will review with you.  You may have some home medications which will be placed on hold until you complete the course of blood thinner medication.  It is important for you to complete the blood thinner medication as prescribed.  PRECAUTIONS:  If you experience chest  pain or shortness of breath - call 911 immediately for transfer to the hospital emergency department.   If you develop a fever greater that 101 F, purulent drainage from wound, increased redness or drainage from wound, foul odor from the wound/dressing, or calf pain - CONTACT YOUR SURGEON.                                                   FOLLOW-UP APPOINTMENTS:  If you do not already have a post-op appointment, please call the office for an appointment to be seen by your surgeon.  Guidelines for how soon to be seen are listed in your After Visit Summary, but are typically between 1-4 weeks after surgery.  OTHER INSTRUCTIONS:     MAKE SURE YOU:   Understand these instructions.   Get help right away if you are not doing well or get worse.    Thank you for letting us be a part of your medical care team.  It is a privilege we respect greatly.  We hope these instructions will help you stay on track for a fast and full recovery!

## 2016-10-09 NOTE — Discharge Summary (Signed)
Physician Discharge Summary  Subjective: 1 Day Post-Op Procedure(s) (LRB): OPEN REDUCTION INTERNAL FIXATION (ORIF) ANKLE FRACTURE (Right) Patient reports pain as mild.   Patient seen in rounds with Dr. Roland Rack. Patient is well, and has had no acute complaints or problems Patient is ready to go to Rehab for PT  Physician Discharge Summary  Patient ID: [name] MRN: [id number] DOB/AGE: 01/09/1946 71 y.o.  Admit date: 10/08/2016 Discharge date: 10/09/2016  Admission Diagnoses:  Discharge Diagnoses:  Active Problems:   Closed fracture of ankle, trimalleolar   Discharged Condition: fair  Hospital Course: The patient is POD 1 from a Right Bimalleolar ORIF, and is doing well.  She ambulated non weight bearing with PT and is ready to go to Rehab.  Treatments: surgery:  Open reduction and internal fixation of closed displaced bimalleolar fracture, right ankle.  Surgeon:   Pascal Lux, MD  Assistant:   None  Anesthesia:   GET  Findings:   As above.  Complications:   None  EBL:   50 cc  Fluids:   800 cc crystalloid  UOP:   None  TT:   91 min at 250 mmHg  Drains:   None  Closure:   Staples  Implants:   Biomet composite 7-hole plate  Discharge Exam: Blood pressure (!) 109/56, pulse 73, temperature 98.7 F (37.1 C), temperature source Oral, resp. rate 19, height 5' (1.524 m), weight 71.6 kg (157 lb 12.8 oz), SpO2 94 %.   Disposition: 01-Home or Self Care   Allergies as of 10/09/2016   No Known Allergies     Medication List    TAKE these medications   aspirin 81 MG EC tablet Take 81 mg by mouth daily.   Coenzyme Q10 100 MG capsule Take 100 mg by mouth daily.   enoxaparin 40 MG/0.4ML injection Commonly known as:  LOVENOX Inject 0.4 mLs (40 mg total) into the skin daily.   HYDROcodone-acetaminophen 7.5-325 MG tablet Commonly known as:  NORCO Take 1 tablet by mouth every 6 (six) hours as needed for moderate pain.   KRILL OIL PO Take  350 mg by mouth daily.   metoprolol tartrate 25 MG tablet Commonly known as:  LOPRESSOR TAKE ONE-HALF TABLET BY MOUTH TWICE DAILY AS DIRECTED   pantoprazole 40 MG tablet Commonly known as:  PROTONIX TAKE ONE (1) TABLET BY MOUTH EVERY DAY   PRESERVISION AREDS 2 PO Take by mouth 2 (two) times daily.   PROBIOTIC FORMULA Caps Take by mouth daily.   rosuvastatin 40 MG tablet Commonly known as:  CRESTOR TAKE ONE (1) TABLET EACH DAY   sertraline 50 MG tablet Commonly known as:  ZOLOFT Take 2 tablets (100 mg total) by mouth at bedtime.   Turmeric Curcumin 500 MG Caps Take by mouth daily.   UNABLE TO FIND Take 4 g by mouth daily. Med Name: IsAgenix dietary supplement. Powder.   cholecalciferol 1000 units tablet Commonly known as:  VITAMIN D Take 1,000 Units by mouth daily.   Vitamin D3 1000 units Caps Take 1,000 Units by mouth 1 day or 1 dose.            Durable Medical Equipment        Start     Ordered   10/08/16 1823  DME Walker rolling  Once    Question:  Patient needs a walker to treat with the following condition  Answer:  Closed fracture of ankle, trimalleolar   10/08/16 1822   10/08/16 1823  DME 3  n 1  Once     10/08/16 1822   10/08/16 1823  DME Bedside commode  Once    Question:  Patient needs a bedside commode to treat with the following condition  Answer:  Closed fracture of ankle, trimalleolar   10/08/16 1822      Contact information for follow-up providers    Poggi, Marshall Cork, MD Follow up in 10 day(s).   Specialty:  Surgery Why:  Staple removal and wound check. Casting. Contact information: [place] [address] [phone]            Contact information for after-discharge care    Destination    [facility] .   Specialty:  [place] information: [address] [place] [phone]                  Signed: Prescott Parma,  Heiley Shaikh 10/09/2016, 12:07 PM   Objective: Vital signs in last 24 hours: Temp:  [97.4 F (36.3 C)-98.9 F (37.2 C)] 98.7 F (37.1 C) (06/22 0833) Pulse Rate:  [73-123] 73 (06/22 0833) Resp:  [13-26] 19 (06/22 0548) BP: (109-147)/(44-77) 109/56 (06/22 0833) SpO2:  [92 %-100 %] 94 % (06/22 0833) Weight:  [68.9 kg (152 lb)-71.6 kg (157 lb 12.8 oz)] 71.6 kg (157 lb 12.8 oz) (06/21 1828)  Intake/Output from previous day:  Intake/Output Summary (Last 24 hours) at 10/09/16 1207 Last data filed at 10/09/16 0946  Gross per 24 hour  Intake             1730 ml  Output                2 ml  Net             1728 ml    Intake/Output this shift: Total I/O In: 240 [P.O.:240] Out: -   Labs: No results for input(s): HGB in the last 72 hours. No results for input(s): WBC, RBC, HCT, PLT in the last 72 hours.  Recent Labs  10/09/16 0544  NA 133*  K 4.2  CL 102  CO2 26  BUN 16  CREATININE 0.76  GLUCOSE 128*  CALCIUM 9.4   No results for input(s): LABPT, INR in the last 72 hours.  EXAM: General - Patient is Alert and Oriented Extremity - Sensation intact distally Intact pulses distally No cellulitis present Incision - clean, dry, no drainage Motor Function -  Toes are able to PF/DF.  Assessment/Plan: 1 Day Post-Op Procedure(s) (LRB): OPEN REDUCTION INTERNAL FIXATION (ORIF) ANKLE FRACTURE (Right) Procedure(s) (LRB): OPEN REDUCTION INTERNAL FIXATION (ORIF) ANKLE FRACTURE (Right) Past Medical History:  Diagnosis Date  . Coronary artery disease 2011   CABG x 3  . Hyperlipidemia   . Hypertension   . Macular degeneration   . MI (myocardial infarction) ([place])    Active Problems:   Closed fracture of ankle, trimalleolar  Estimated body mass index is 30.82 kg/m as calculated from the following:   Height as of this encounter: 5' (1.524 m).   Weight as of this encounter: 71.6 kg (157 lb 12.8 oz). Up with therapy Discharge to SNF Diet - Regular diet Follow up - in 10  days Activity - NWB Disposition - Rehab Condition Upon Discharge - Stable DVT Prophylaxis - Lovenox  Reche Dixon, PA-C Orthopaedic Surgery 10/09/2016, 12:07 PM

## 2016-10-09 NOTE — Telephone Encounter (Signed)
Pt took a big fall in [place] and is at [facility] for the next 7-10 days. Pt would like me to call her at the end of July

## 2016-10-11 LAB — URINE CULTURE: Culture: NO GROWTH

## 2016-10-12 ENCOUNTER — Other Ambulatory Visit
Admission: RE | Admit: 2016-10-12 | Discharge: 2016-10-12 | Disposition: A | Discharge status: Home / Self Care | Payer: Medicare HMO | Source: Skilled Nursing Facility | Attending: Gerontology | Admitting: Gerontology

## 2016-10-12 DIAGNOSIS — I10 Essential (primary) hypertension: Secondary | ICD-10-CM | POA: Insufficient documentation

## 2016-10-12 LAB — CBC WITH DIFFERENTIAL/PLATELET
BASOS ABS: 0.1 10*3/uL (ref 0–0.1)
Basophils Relative: 1 %
EOS ABS: 0.4 10*3/uL (ref 0–0.7)
Eosinophils Relative: 5 %
HCT: 33.9 % — ABNORMAL LOW (ref 35.0–47.0)
HEMOGLOBIN: 11.6 g/dL — AB (ref 12.0–16.0)
LYMPHS ABS: 1.5 10*3/uL (ref 1.0–3.6)
LYMPHS PCT: 20 %
MCH: 32 pg (ref 26.0–34.0)
MCHC: 34.1 g/dL (ref 32.0–36.0)
MCV: 93.8 fL (ref 80.0–100.0)
Monocytes Absolute: 1.1 10*3/uL — ABNORMAL HIGH (ref 0.2–0.9)
Monocytes Relative: 14 %
NEUTROS PCT: 60 %
Neutro Abs: 4.5 10*3/uL (ref 1.4–6.5)
PLATELETS: 242 10*3/uL (ref 150–440)
RBC: 3.61 MIL/uL — AB (ref 3.80–5.20)
RDW: 14.1 % (ref 11.5–14.5)
WBC: 7.6 10*3/uL (ref 3.6–11.0)

## 2016-10-12 LAB — COMPREHENSIVE METABOLIC PANEL
ALK PHOS: 63 U/L (ref 38–126)
ALT: 16 U/L (ref 14–54)
AST: 32 U/L (ref 15–41)
Albumin: 3.3 g/dL — ABNORMAL LOW (ref 3.5–5.0)
Anion gap: 6 (ref 5–15)
BUN: 13 mg/dL (ref 6–20)
CALCIUM: 9.7 mg/dL (ref 8.9–10.3)
CHLORIDE: 102 mmol/L (ref 101–111)
CO2: 25 mmol/L (ref 22–32)
CREATININE: 0.65 mg/dL (ref 0.44–1.00)
GFR calc Af Amer: >60 mL/min (ref >60)
GFR calc non Af Amer: >60 mL/min (ref >60)
Glucose, Bld: 92 mg/dL (ref 65–99)
Potassium: 3.9 mmol/L (ref 3.5–5.1)
SODIUM: 133 mmol/L — AB (ref 135–145)
Total Bilirubin: 0.9 mg/dL (ref 0.3–1.2)
Total Protein: 6.4 g/dL — ABNORMAL LOW (ref 6.5–8.1)

## 2016-10-13 DIAGNOSIS — I2510 Atherosclerotic heart disease of native coronary artery without angina pectoris: Secondary | ICD-10-CM | POA: Diagnosis not present

## 2016-10-13 DIAGNOSIS — I739 Peripheral vascular disease, unspecified: Secondary | ICD-10-CM | POA: Diagnosis not present

## 2016-10-13 DIAGNOSIS — M5136 Other intervertebral disc degeneration, lumbar region: Secondary | ICD-10-CM | POA: Diagnosis not present

## 2016-10-13 DIAGNOSIS — M8000XD Age-related osteoporosis with current pathological fracture, unspecified site, subsequent encounter for fracture with routine healing: Secondary | ICD-10-CM | POA: Diagnosis not present

## 2016-10-15 ENCOUNTER — Encounter (INDEPENDENT_AMBULATORY_CARE_PROVIDER_SITE_OTHER): Payer: Medicare HMO | Admitting: Ophthalmology

## 2016-10-15 DIAGNOSIS — I10 Essential (primary) hypertension: Secondary | ICD-10-CM | POA: Diagnosis not present

## 2016-10-15 DIAGNOSIS — H35033 Hypertensive retinopathy, bilateral: Secondary | ICD-10-CM | POA: Diagnosis not present

## 2016-10-15 DIAGNOSIS — H353221 Exudative age-related macular degeneration, left eye, with active choroidal neovascularization: Secondary | ICD-10-CM

## 2016-10-15 DIAGNOSIS — H43813 Vitreous degeneration, bilateral: Secondary | ICD-10-CM

## 2016-10-15 DIAGNOSIS — H353112 Nonexudative age-related macular degeneration, right eye, intermediate dry stage: Secondary | ICD-10-CM | POA: Diagnosis not present

## 2016-10-18 ENCOUNTER — Encounter
Admission: RE | Admit: 2016-10-18 | Discharge: 2016-10-18 | Disposition: A | Discharge status: Home / Self Care | Payer: Medicare HMO | Source: Ambulatory Visit | Attending: Internal Medicine | Admitting: Internal Medicine

## 2016-10-19 ENCOUNTER — Non-Acute Institutional Stay (SKILLED_NURSING_FACILITY): Payer: Medicare HMO | Admitting: Gerontology

## 2016-10-19 DIAGNOSIS — S82851D Displaced trimalleolar fracture of right lower leg, subsequent encounter for closed fracture with routine healing: Secondary | ICD-10-CM | POA: Diagnosis not present

## 2016-10-20 ENCOUNTER — Encounter: Payer: Self-pay | Admitting: Gerontology

## 2016-10-20 DIAGNOSIS — M5136 Other intervertebral disc degeneration, lumbar region: Secondary | ICD-10-CM | POA: Insufficient documentation

## 2016-10-20 DIAGNOSIS — S82851A Displaced trimalleolar fracture of right lower leg, initial encounter for closed fracture: Secondary | ICD-10-CM | POA: Diagnosis not present

## 2016-10-20 NOTE — Progress Notes (Signed)
Location:   [facility] [place] [facility] [place] [facility] Room Number: [place] of Service:  SNF [id number]) Provider:  Toni Arthurs, NP-C  Arnett, Yvetta Coder, FNP  Patient Care Team: Burnard Hawthorne, FNP as PCP - General (Family Medicine)  Extended Emergency Contact Information Primary Emergency Contact: Keturah Shavers [place] Phone: [phone] Relation: Friend Secondary Emergency Contact: Daylene Posey [place] Phone: [phone] Relation: Brother  Code Status:  Full Goals of care: Advanced Directive information Advanced Directives 10/20/2016  Does Patient Have a Medical Advance Directive? No  Type of Advance Directive -  Does patient want to make changes to medical advance directive? -  Copy of [place] in Chart? -  Would patient like information on creating a medical advance directive? -     Chief Complaint  Patient presents with  . Medical Management of Chronic Issues    Routine    HPI:  Pt is a 71 y.o. female seen today for follow up. Pt was admitted to the facility for rehab following hospitalization for Right Ankle fracture with surgical intervention s/p mechanical fall. Pt's ankle is in a soft, acewrap splint. Pt reports she is supposed to go for a follow-up appointment with the orthopedist tomorrow and have a hard cast applied. She is hopeful she will be able to bear weight through the leg then. She has been participating in PT and OT. Mobilizing on the unit in the wheelchair. She reports her pain is fairly well controlled. She c/o mostly some spasms/ cramps. Reports the Robaxin helps this. She is in good spirits. Appetite is good. Having regular BMs. Overall, feels she is doing well. VSS. No other complaints.      Past Medical History:  Diagnosis Date  . Coronary artery disease 2011   CABG x 3  . DDD (degenerative disc disease), lumbar   . Hyperlipidemia   . Hypertension    under control  . Macular  degeneration   . MI (myocardial infarction) ([place])   . PVD (peripheral vascular disease) ([place])    Past Surgical History:  Procedure Laterality Date  . CARDIAC CATHETERIZATION  2013   [facility]  . CATARACT EXTRACTION    . CORONARY ARTERY BYPASS GRAFT  02/07/2010   x 3  . FACIAL COSMETIC SURGERY  2013   Dr. Nadeen Landau  . FOOT SURGERY     right  . ILIO-FEMORAL BYPASS GRAFT  2011  . MITRAL VALVE REPAIR  01/2010  . ORIF ANKLE FRACTURE Right 10/08/2016   Procedure: OPEN REDUCTION INTERNAL FIXATION (ORIF) ANKLE FRACTURE;  Surgeon: Corky Mull, MD;  Location: [facility] ORS;  Service: Orthopedics;  Laterality: Right;  . PARATHYROIDECTOMY    . TUBAL LIGATION      No Known Allergies  Allergies as of 10/19/2016   No Known Allergies     Medication List       Accurate as of 10/19/16 11:59 PM. Always use your most recent med list.          aspirin 81 MG EC tablet Take 81 mg by mouth daily.   Coenzyme Q10 100 MG capsule Take 100 mg by mouth daily.   enoxaparin 40 MG/0.4ML injection Commonly known as:  LOVENOX Inject 0.4 mLs (40 mg total) into the skin daily.   HYDROcodone-acetaminophen 7.5-325 MG tablet Commonly known as:  NORCO Take 1 tablet by mouth every 6 (six) hours as needed for moderate pain.   Krill Oil 350 MG Caps Take 1  capsule by mouth daily.   methocarbamol 500 MG tablet Commonly known as:  ROBAXIN Take 500 mg by mouth every 6 (six) hours as needed for muscle spasms.   metoprolol tartrate 25 MG tablet Commonly known as:  LOPRESSOR TAKE ONE-HALF TABLET BY MOUTH TWICE DAILY AS DIRECTED   pantoprazole 40 MG tablet Commonly known as:  PROTONIX TAKE ONE (1) TABLET BY MOUTH EVERY DAY   PRESERVISION AREDS 2 Caps Take 1 capsule by mouth 2 (two) times daily.   RISA-BID PROBIOTIC PO Take 250 mg by mouth 2 (two) times daily.   rosuvastatin 40 MG tablet Commonly known as:  CRESTOR TAKE ONE (1) TABLET EACH DAY   sertraline 50 MG tablet Commonly known as:  ZOLOFT Take  2 tablets (100 mg total) by mouth at bedtime.   Turmeric Curcumin 500 MG Caps Take by mouth daily.   Vitamin D3 1000 units Caps Take 1,000 Units by mouth daily.       Review of Systems  Constitutional: Negative for activity change, appetite change, chills, diaphoresis and fever.  HENT: Negative for congestion, sneezing, sore throat, trouble swallowing and voice change.   Respiratory: Negative for apnea, cough, choking, chest tightness, shortness of breath and wheezing.   Cardiovascular: Negative for chest pain, palpitations and leg swelling.  Gastrointestinal: Negative for abdominal distention, abdominal pain, constipation, diarrhea and nausea.  Genitourinary: Negative for difficulty urinating, dysuria, frequency and urgency.  Musculoskeletal: Positive for arthralgias (typical arthritis) and joint swelling. Negative for back pain, gait problem and myalgias.  Skin: Negative for color change, pallor, rash and wound.  Neurological: Negative for dizziness, tremors, syncope, speech difficulty, weakness, numbness and headaches.  Psychiatric/Behavioral: Negative for agitation and behavioral problems.  All other systems reviewed and are negative.   Immunization History  Administered Date(s) Administered  . Influenza Split 01/19/2012  . Influenza-Unspecified 03/21/2010, 01/14/2011, 01/29/2012, 01/18/2013, 01/22/2014, 01/16/2015, 01/29/2016  . Pneumococcal Conjugate-13 01/04/2014  . Pneumococcal Polysaccharide-23 10/03/2008  . Td 06/26/2010  . Tdap 06/26/2010  . Zoster 11/19/2007   Pertinent  Health Maintenance Due  Topic Date Due  . INFLUENZA VACCINE  11/18/2016  . MAMMOGRAM  03/03/2018  . COLONOSCOPY  11/05/2022  . DEXA SCAN  Completed  . PNA vac Low Risk Adult  Completed   Fall Risk  03/26/2016 01/21/2016 10/03/2015 01/04/2014 11/24/2012  Falls in the past year? No No No No No   Functional Status Survey:    Vitals:   10/19/16 1404  BP: (!) 109/53  Pulse: 73  Resp: 18  Temp:  97.8 F (36.6 C)  SpO2: 96%  Weight: 148 lb 9.6 oz (67.4 kg)  Height: 5' (1.524 m)   Body mass index is 29.02 kg/m. Physical Exam  Constitutional: She is oriented to person, place, and time. Vital signs are normal. She appears well-developed and well-nourished. She is active and cooperative. She does not appear ill. No distress.  HENT:  Head: Normocephalic and atraumatic.  Mouth/Throat: Uvula is midline, oropharynx is clear and moist and mucous membranes are normal. Mucous membranes are not pale, not dry and not cyanotic.  Eyes: Conjunctivae, EOM and lids are normal. Pupils are equal, round, and reactive to light.  Neck: Trachea normal, normal range of motion and full passive range of motion without pain. Neck supple. No JVD present. No tracheal deviation, no edema and no erythema present. No thyromegaly present.  Cardiovascular: Normal rate, regular rhythm, normal heart sounds, intact distal pulses and normal pulses.  Exam reveals no gallop, no distant heart sounds  and no friction rub.   No murmur heard. Splint in place on RLE- cannot access pulse. Toes warm with cap refills WNL.   Pulmonary/Chest: Effort normal and breath sounds normal. No accessory muscle usage. No respiratory distress. She has no wheezes. She has no rales. She exhibits no tenderness.  Abdominal: Normal appearance and bowel sounds are normal. She exhibits no distension and no ascites. There is no tenderness.  Musculoskeletal: She exhibits no edema.       Right ankle: She exhibits decreased range of motion and swelling. Tenderness.  Expected osteoarthritis, stiffness; calves soft, supple. Negative Homan's sign.  Neurological: She is alert and oriented to person, place, and time. She has normal strength.  Skin: Skin is warm, dry and intact. No rash noted. She is not diaphoretic. No cyanosis or erythema. No pallor. Nails show no clubbing.  Psychiatric: She has a normal mood and affect. Her speech is normal and behavior is  normal. Judgment and thought content normal. Cognition and memory are normal.  Nursing note and vitals reviewed.   Labs reviewed:  Recent Labs  06/16/16 0831 10/09/16 0544 10/12/16 0425  NA 134 133* 133*  K 5.0 4.2 3.9  CL 97 102 102  CO2 24 26 25   GLUCOSE 97 128* 92  BUN 19 16 13   CREATININE 0.86 0.76 0.65  CALCIUM 10.1 9.4 9.7    Recent Labs  06/16/16 0831 10/12/16 0425  AST 27 32  ALT 25 16  ALKPHOS 70 63  BILITOT 0.4 0.9  PROT 6.5 6.4*  ALBUMIN 4.2 3.3*    Recent Labs  10/12/16 0425  WBC 7.6  NEUTROABS 4.5  HGB 11.6*  HCT 33.9*  MCV 93.8  PLT 242   Lab Results  Component Value Date   TSH 3.170 09/26/2015   Lab Results  Component Value Date   HGBA1C 5.7 (H) 09/26/2015   Lab Results  Component Value Date   CHOL 138 06/16/2016   HDL 64 06/16/2016   LDLCALC 55 06/16/2016   TRIG 96 06/16/2016   CHOLHDL 2.2 06/16/2016    Significant Diagnostic Results in last 30 days:  Dg Ankle 2 Views Right  Result Date: 10/08/2016 CLINICAL DATA:  Open reduction internal fixation for fractures EXAM: DG C-ARM 61-120 MIN; RIGHT ANKLE - 2 VIEW COMPARISON:  None. FLUOROSCOPY TIME:  0 minutes 36 seconds ; 0.70 mGy; 4 acquired images FINDINGS: Frontal and lateral views obtained. There are 2 screws transfixing a fracture of the medial malleolus. There is screw and plate fixation in the distal fibula transfixing a distal fibular fracture. Alignment at the fracture sites is essentially anatomic. Ankle mortise appears intact. IMPRESSION: Fixation of fractures of the medial malleolus and distal fibula with alignment essentially anatomic on submitted images. Electronically Signed   By: Lowella Grip III M.D.   On: 10/08/2016 16:48   Dg C-arm 1-60 Min  Result Date: 10/08/2016 CLINICAL DATA:  Open reduction internal fixation for fractures EXAM: DG C-ARM 61-120 MIN; RIGHT ANKLE - 2 VIEW COMPARISON:  None. FLUOROSCOPY TIME:  0 minutes 36 seconds ; 0.70 mGy; 4 acquired images  FINDINGS: Frontal and lateral views obtained. There are 2 screws transfixing a fracture of the medial malleolus. There is screw and plate fixation in the distal fibula transfixing a distal fibular fracture. Alignment at the fracture sites is essentially anatomic. Ankle mortise appears intact. IMPRESSION: Fixation of fractures of the medial malleolus and distal fibula with alignment essentially anatomic on submitted images. Electronically Signed   By: Gwyndolyn Saxon  Jasmine December III M.D.   On: 10/08/2016 16:48    Assessment/Plan 1. Closed trimalleolar fracture of right ankle with routine healing, subsequent encounter  Continue PT/OT  Continue exercises as taught by PT/OT  Elevate foot/leg when at rest  Ice Pack PRN for pain, swelling  Continue Hydrocodone/APAP 7.5/325 mg 1 tablet po Q 6 hours prn pain  Continue Methocarbamol 600 mg po Q 6 hours prn pain, spasms   Family/ staff Communication:   Total Time:  Documentation:  Face to Face:  Family/Phone:   Labs/tests ordered:  none  Medication list reviewed and assessed for continued appropriateness. Monthly medication orders reviewed and signed.  Patient needs a Immunologist wheelchair with elevated leg rests.  Patient is able to propel self and maintain current level of independence using a wheelchair.  Patient is unable to perform these same daily functions with a walker, cane or other alternative assistive device.     Vikki Ports, NP-C Geriatrics [facility] [phone], [place] [address] Cell Phone (Mon-Fri 8am-5pm):  [phone] On Call:  [phone] & follow prompts after 5pm & weekends Office Phone:  [phone] Office Fax:  [phone]

## 2016-10-27 ENCOUNTER — Non-Acute Institutional Stay (SKILLED_NURSING_FACILITY): Payer: Medicare HMO | Admitting: Gerontology

## 2016-10-27 DIAGNOSIS — S82851D Displaced trimalleolar fracture of right lower leg, subsequent encounter for closed fracture with routine healing: Secondary | ICD-10-CM

## 2016-10-28 ENCOUNTER — Encounter: Payer: Self-pay | Admitting: Gerontology

## 2016-10-28 NOTE — Progress Notes (Signed)
Location:   [facility] Room Number: [place] of Service:  SNF [phone]) Provider:  Toni Arthurs, NP-C  [name], Brittany Coder, FNP  Patient Care Team: Burnard Hawthorne, FNP as PCP - General (Family Medicine)  Extended Emergency Contact Information Primary Emergency Contact: Keturah Shavers [place] of [place] Phone: [phone] Relation: Friend Secondary Emergency Contact: Daylene Posey [place] of [place] Phone: [phone] Relation: Brother  Code Status:  Full Goals of care: Advanced Directive information Advanced Directives 10/28/2016  Does Patient Have a Medical Advance Directive? No  Type of Advance Directive -  Does patient want to make changes to medical advance directive? -  Copy of [place] in Chart? -  Would patient like information on creating a medical advance directive? -     Chief Complaint  Patient presents with  . Medical Management of Chronic Issues    Routine Visit    HPI:  Pt is a 71 y.o. female seen today for follow up. Pt was admitted to the facility for rehab following hospitalization for Right Ankle fracture with surgical intervention s/p mechanical fall. Pt's ankle is in a hard cast. Pt reports she is went for a follow-up appointment with the orthopedist and had a hard cast applied. She is hopeful she will be able to bear weight through the leg soon. She has been participating in PT and OT. Mobilizing on the unit in the wheelchair. She reports her pain is fairly well controlled. Uses the PRN Norco on average 3 times/ day. She c/o mostly some spasms/ cramps. Reports the Robaxin helps this. She has been using the Robaxin on average twice a day. She is in good spirits. Appetite is good. Having regular BMs. Overall, feels she is doing well. VSS. No other complaints.      Past Medical History:  Diagnosis Date  . Coronary artery disease 2011   CABG x 3  . DDD (degenerative disc disease),  lumbar   . Hyperlipidemia   . Hypertension    under control  . Macular degeneration   . MI (myocardial infarction) ([place])   . PVD (peripheral vascular disease) ([place])    Past Surgical History:  Procedure Laterality Date  . CARDIAC CATHETERIZATION  2013   [facility]  . CATARACT EXTRACTION    . CORONARY ARTERY BYPASS GRAFT  02/07/2010   x 3  . FACIAL COSMETIC SURGERY  2013   Dr. Nadeen Landau  . FOOT SURGERY     right  . ILIO-FEMORAL BYPASS GRAFT  2011  . MITRAL VALVE REPAIR  01/2010  . ORIF ANKLE FRACTURE Right 10/08/2016   Procedure: OPEN REDUCTION INTERNAL FIXATION (ORIF) ANKLE FRACTURE;  Surgeon: Corky Mull, MD;  Location: [facility] ORS;  Service: Orthopedics;  Laterality: Right;  . PARATHYROIDECTOMY    . TUBAL LIGATION      No Known Allergies  Allergies as of 10/27/2016   No Known Allergies     Medication List       Accurate as of 10/27/16 11:59 PM. Always use your most recent med list.          alum & mag hydroxide-simeth 400-400-40 MG/5ML suspension Commonly known as:  MAALOX PLUS Take 30 mLs by mouth every 4 (four) hours as needed for indigestion.   aspirin 81 MG EC tablet Take 81 mg by mouth daily.   Coenzyme Q10 100 MG capsule Take 100 mg by mouth daily.   enoxaparin 40 MG/0.4ML injection Commonly known as:  LOVENOX Inject 0.4 mLs (40 mg total) into the skin daily.   HYDROcodone-acetaminophen 7.5-325 MG tablet Commonly known as:  NORCO Take 1 tablet by mouth every 6 (six) hours as needed for moderate pain.   Krill Oil 350 MG Caps Take 1 capsule by mouth daily.   magnesium hydroxide 400 MG/5ML suspension Commonly known as:  MILK OF MAGNESIA Take 30 mLs by mouth every 4 (four) hours as needed. Constipation/no BM for 2 days   methocarbamol 500 MG tablet Commonly known as:  ROBAXIN Take 500 mg by mouth every 6 (six) hours as needed for muscle spasms.   metoprolol tartrate 25 MG tablet Commonly known as:  LOPRESSOR Take 12.5 mg by mouth 2 (two) times  daily. Take 1/2 tablet   pantoprazole 40 MG tablet Commonly known as:  PROTONIX TAKE ONE (1) TABLET BY MOUTH EVERY DAY   PRESERVISION AREDS 2 Caps Take 1 capsule by mouth 2 (two) times daily.   RISA-BID PROBIOTIC PO Take 250 mg by mouth 2 (two) times daily.   rosuvastatin 40 MG tablet Commonly known as:  CRESTOR TAKE ONE (1) TABLET EACH DAY   sertraline 50 MG tablet Commonly known as:  ZOLOFT Take 2 tablets (100 mg total) by mouth at bedtime.   Turmeric Curcumin 500 MG Caps Take by mouth daily.   Vitamin D3 1000 units Caps Take 1,000 Units by mouth daily.       Review of Systems  Constitutional: Negative for activity change, appetite change, chills, diaphoresis and fever.  HENT: Negative for congestion, sneezing, sore throat, trouble swallowing and voice change.   Respiratory: Negative for apnea, cough, choking, chest tightness, shortness of breath and wheezing.   Cardiovascular: Negative for chest pain, palpitations and leg swelling.  Gastrointestinal: Negative for abdominal distention, abdominal pain, constipation, diarrhea and nausea.  Genitourinary: Negative for difficulty urinating, dysuria, frequency and urgency.  Musculoskeletal: Positive for arthralgias (typical arthritis) and joint swelling. Negative for back pain, gait problem and myalgias.  Skin: Negative for color change, pallor, rash and wound.  Neurological: Negative for dizziness, tremors, syncope, speech difficulty, weakness, numbness and headaches.  Psychiatric/Behavioral: Negative for agitation and behavioral problems.  All other systems reviewed and are negative.   Immunization History  Administered Date(s) Administered  . Influenza Split 01/19/2012  . Influenza-Unspecified 03/21/2010, 01/14/2011, 01/29/2012, 01/18/2013, 01/22/2014, 01/16/2015, 01/29/2016  . Pneumococcal Conjugate-13 01/04/2014  . Pneumococcal Polysaccharide-23 10/03/2008  . Td 06/26/2010  . Tdap 06/26/2010  . Zoster 11/19/2007    Pertinent  Health Maintenance Due  Topic Date Due  . INFLUENZA VACCINE  11/18/2016  . MAMMOGRAM  03/03/2018  . COLONOSCOPY  11/05/2022  . DEXA SCAN  Completed  . PNA vac Low Risk Adult  Completed   Fall Risk  03/26/2016 01/21/2016 10/03/2015 01/04/2014 11/24/2012  Falls in the past year? No No No No No   Functional Status Survey:    Vitals:   10/27/16 0843  BP: 108/67  Pulse: 74  Resp: 17  Temp: (!) 97.5 F (36.4 C)  SpO2: 94%  Weight: 148 lb 9.6 oz (67.4 kg)  Height: 5' (1.524 m)   Body mass index is 29.02 kg/m. Physical Exam  Constitutional: She is oriented to person, place, and time. Vital signs are normal. She appears well-developed and well-nourished. She is active and cooperative. She does not appear ill. No distress.  HENT:  Head: Normocephalic and atraumatic.  Mouth/Throat: Uvula is midline, oropharynx is clear and moist and mucous membranes are normal. Mucous membranes are not pale,  not dry and not cyanotic.  Eyes: Conjunctivae, EOM and lids are normal. Pupils are equal, round, and reactive to light.  Neck: Trachea normal, normal range of motion and full passive range of motion without pain. Neck supple. No JVD present. No tracheal deviation, no edema and no erythema present. No thyromegaly present.  Cardiovascular: Normal rate, regular rhythm, normal heart sounds, intact distal pulses and normal pulses.  Exam reveals no gallop, no distant heart sounds and no friction rub.   No murmur heard. Splint in place on RLE- cannot access pulse. Toes warm with cap refills WNL.   Pulmonary/Chest: Effort normal and breath sounds normal. No accessory muscle usage. No respiratory distress. She has no wheezes. She has no rales. She exhibits no tenderness.  Abdominal: Normal appearance and bowel sounds are normal. She exhibits no distension and no ascites. There is no tenderness.  Musculoskeletal: She exhibits no edema.       Right ankle: She exhibits decreased range of motion and  swelling. Tenderness.  Expected osteoarthritis, stiffness; calves soft, supple. Negative Homan's sign.  Neurological: She is alert and oriented to person, place, and time. She has normal strength.  Skin: Skin is warm, dry and intact. No rash noted. She is not diaphoretic. No cyanosis or erythema. No pallor. Nails show no clubbing.  Psychiatric: She has a normal mood and affect. Her speech is normal and behavior is normal. Judgment and thought content normal. Cognition and memory are normal.  Nursing note and vitals reviewed.   Labs reviewed:  Recent Labs  06/16/16 0831 10/09/16 0544 10/12/16 0425  NA 134 133* 133*  K 5.0 4.2 3.9  CL 97 102 102  CO2 24 26 25   GLUCOSE 97 128* 92  BUN 19 16 13   CREATININE 0.86 0.76 0.65  CALCIUM 10.1 9.4 9.7    Recent Labs  06/16/16 0831 10/12/16 0425  AST 27 32  ALT 25 16  ALKPHOS 70 63  BILITOT 0.4 0.9  PROT 6.5 6.4*  ALBUMIN 4.2 3.3*    Recent Labs  10/12/16 0425  WBC 7.6  NEUTROABS 4.5  HGB 11.6*  HCT 33.9*  MCV 93.8  PLT 242   Lab Results  Component Value Date   TSH 3.170 09/26/2015   Lab Results  Component Value Date   HGBA1C 5.7 (H) 09/26/2015   Lab Results  Component Value Date   CHOL 138 06/16/2016   HDL 64 06/16/2016   LDLCALC 55 06/16/2016   TRIG 96 06/16/2016   CHOLHDL 2.2 06/16/2016    Significant Diagnostic Results in last 30 days:  Dg Ankle 2 Views Right  Result Date: 10/08/2016 CLINICAL DATA:  Open reduction internal fixation for fractures EXAM: DG C-ARM 61-120 MIN; RIGHT ANKLE - 2 VIEW COMPARISON:  None. FLUOROSCOPY TIME:  0 minutes 36 seconds ; 0.70 mGy; 4 acquired images FINDINGS: Frontal and lateral views obtained. There are 2 screws transfixing a fracture of the medial malleolus. There is screw and plate fixation in the distal fibula transfixing a distal fibular fracture. Alignment at the fracture sites is essentially anatomic. Ankle mortise appears intact. IMPRESSION: Fixation of fractures of the  medial malleolus and distal fibula with alignment essentially anatomic on submitted images. Electronically Signed   By: Lowella Grip III M.D.   On: 10/08/2016 16:48   Dg C-arm 1-60 Min  Result Date: 10/08/2016 CLINICAL DATA:  Open reduction internal fixation for fractures EXAM: DG C-ARM 61-120 MIN; RIGHT ANKLE - 2 VIEW COMPARISON:  None. FLUOROSCOPY TIME:  0  minutes 36 seconds ; 0.70 mGy; 4 acquired images FINDINGS: Frontal and lateral views obtained. There are 2 screws transfixing a fracture of the medial malleolus. There is screw and plate fixation in the distal fibula transfixing a distal fibular fracture. Alignment at the fracture sites is essentially anatomic. Ankle mortise appears intact. IMPRESSION: Fixation of fractures of the medial malleolus and distal fibula with alignment essentially anatomic on submitted images. Electronically Signed   By: Lowella Grip III M.D.   On: 10/08/2016 16:48    Assessment/Plan 1. Closed trimalleolar fracture of right ankle with routine healing, subsequent encounter  Continue PT/OT  Continue exercises as taught by PT/OT  Elevate foot/leg when at rest  Ice Pack PRN for pain, swelling  Continue Hydrocodone/APAP 7.5/325 mg 1 tablet po Q 6 hours prn pain  Continue Methocarbamol 600 mg po Q 6 hours prn pain, spasms   Family/ staff Communication:   Total Time:  Documentation:  Face to Face:  Family/Phone:   Labs/tests ordered:  none  Medication list reviewed and assessed for continued appropriateness. Monthly medication orders reviewed and signed.  Patient needs a Immunologist wheelchair with elevated leg rests.  Patient is able to propel self and maintain current level of independence using a wheelchair.  Patient is unable to perform these same daily functions with a walker, cane or other alternative assistive device.     Brittany Ports, NP-C Geriatrics [facility] [phone],  [place] [address] Cell Phone (Mon-Fri 8am-5pm):  [phone] On Call:  [phone] & follow prompts after 5pm & weekends Office Phone:  [phone] Office Fax:  [phone]

## 2016-10-30 ENCOUNTER — Non-Acute Institutional Stay (SKILLED_NURSING_FACILITY): Payer: Medicare HMO | Admitting: Gerontology

## 2016-10-30 ENCOUNTER — Encounter: Payer: Self-pay | Admitting: Gerontology

## 2016-10-30 DIAGNOSIS — S82851D Displaced trimalleolar fracture of right lower leg, subsequent encounter for closed fracture with routine healing: Secondary | ICD-10-CM

## 2016-10-30 NOTE — Progress Notes (Signed)
Location:   [facility] Service:  SNF [phone])  Provider: Toni Arthurs, NP-C  PCP: Burnard Hawthorne, FNP Patient Care Team: Burnard Hawthorne, FNP as PCP - General (Family Medicine)  Extended Emergency Contact Information Primary Emergency Contact: Keturah Shavers States of [place] Phone: [phone] Relation: Friend Secondary Emergency Contact: Daylene Posey [place] of [place] Phone: [phone] Relation: Brother  Code Status: Full Goals of care:  Advanced Directive information Advanced Directives 10/30/2016  Does Patient Have a Medical Advance Directive? No  Type of Advance Directive -  Does patient want to make changes to medical advance directive? -  Copy of [name] in Chart? -  Would patient like information on creating a medical advance directive? -     No Known Allergies  Chief Complaint  Patient presents with  . Discharge Note    Discharged from SNF    HPI:  71 y.o. [name] seen today for discharge evaluation. Pt was admitted to the facility for rehab following hospitalization for Right Ankle fracture with surgical intervention s/p mechanical fall. Pt's ankle is in a hard cast. Pt reports she is went for a follow-up appointment with the orthopedist and had a hard cast applied. She is hopeful she will be able to bear weight through the leg soon. She has been participating in PT and OT. Mobilizing on the unit in the wheelchair. She reports her pain is fairly well controlled. Uses the PRN Norco on average 3 times/ day. She c/o mostly some spasms/ cramps. Reports the Robaxin helps this. She has been using the Robaxin on average twice a day. She is in good spirits. Appetite is good and has improved since admission. Having regular BMs. Overall, feels she is doing well. Pt reports she is excited and ready for discharge home in the morning. VSS. No other complaints.          Past Medical History:    Diagnosis Date  . Coronary artery disease 2011   CABG x 3  . DDD (degenerative disc disease), lumbar   . Hyperlipidemia   . Hypertension    under control  . Macular degeneration   . MI (myocardial infarction) ([place])   . PVD (peripheral vascular disease) ([place])     Past Surgical History:  Procedure Laterality Date  . CARDIAC CATHETERIZATION  2013   [facility]  . CATARACT EXTRACTION    . CORONARY ARTERY BYPASS GRAFT  02/07/2010   x 3  . FACIAL COSMETIC SURGERY  2013   Dr. Nadeen Landau  . FOOT SURGERY     right  . ILIO-FEMORAL BYPASS GRAFT  2011  . MITRAL VALVE REPAIR  01/2010  . ORIF ANKLE FRACTURE Right 10/08/2016   Procedure: OPEN REDUCTION INTERNAL FIXATION (ORIF) ANKLE FRACTURE;  Surgeon: Corky Mull, MD;  Location: [facility] ORS;  Service: Orthopedics;  Laterality: Right;  . PARATHYROIDECTOMY    . TUBAL LIGATION        reports that she quit smoking about 7 years ago. Her smoking use included Cigarettes. She has a 40.00 pack-year smoking history. She has never used smokeless tobacco. She reports that she drinks about 0.5 oz of alcohol per week . She reports that she does not use drugs. Social History   Social History  . Marital status: Unknown    Spouse name: N/A  . Number of children: N/A  . Years of education: N/A   Occupational History  . Not on file.  Social History Main Topics  . Smoking status: Former Smoker    Packs/day: 1.00    Years: 40.00    Types: Cigarettes    Quit date: 07/15/2009  . Smokeless tobacco: Never Used  . Alcohol use 0.5 oz/week    1 Standard drinks or equivalent per week     Comment: occ.  . Drug use: No  . Sexual activity: Not on file   Other Topics Concern  . 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    Exercise- walks daily , wears fit bit.    Functional Status Survey:    No Known Allergies  Pertinent  Health Maintenance Due  Topic  Date Due  . INFLUENZA VACCINE  11/18/2016  . MAMMOGRAM  03/03/2018  . COLONOSCOPY  11/05/2022  . DEXA SCAN  Completed  . PNA vac Low Risk Adult  Completed    Medications: Allergies as of 10/30/2016   No Known Allergies     Medication List       Accurate as of 10/30/16  3:24 PM. Always use your most recent med list.          alum & mag hydroxide-simeth 379-024-09 MG/5ML suspension Commonly known as:  MAALOX PLUS Take 30 mLs by mouth every 4 (four) hours as needed for indigestion.   aspirin 81 MG EC tablet Take 81 mg by mouth daily.   Coenzyme Q10 100 MG capsule Take 100 mg by mouth daily.   HYDROcodone-acetaminophen 7.5-325 MG tablet Commonly known as:  NORCO Take 1 tablet by mouth every 6 (six) hours as needed for moderate pain.   Krill Oil 350 MG Caps Take 1 capsule by mouth daily.   magnesium hydroxide 400 MG/5ML suspension Commonly known as:  MILK OF MAGNESIA Take 30 mLs by mouth every 4 (four) hours as needed. Constipation/no BM for 2 days   methocarbamol 500 MG tablet Commonly known as:  ROBAXIN Take 500 mg by mouth every 6 (six) hours as needed for muscle spasms.   metoprolol tartrate 25 MG tablet Commonly known as:  LOPRESSOR Take 12.5 mg by mouth 2 (two) times daily. Take 1/2 tablet   pantoprazole 40 MG tablet Commonly known as:  PROTONIX TAKE ONE (1) TABLET BY MOUTH EVERY DAY   PRESERVISION AREDS 2 Caps Take 1 capsule by mouth 2 (two) times daily.   RISA-BID PROBIOTIC PO Take 250 mg by mouth 2 (two) times daily.   rosuvastatin 40 MG tablet Commonly known as:  CRESTOR TAKE ONE (1) TABLET EACH DAY   sertraline 50 MG tablet Commonly known as:  ZOLOFT Take 2 tablets (100 mg total) by mouth at bedtime.   Turmeric Curcumin 500 MG Caps Take 500 mg by mouth daily.   Vitamin D3 1000 units Caps Take 1,000 Units by mouth daily.       Review of Systems  Constitutional: Negative for activity change, appetite change, chills, diaphoresis and  fever.  HENT: Negative for congestion, sneezing, sore throat, trouble swallowing and voice change.   Respiratory: Negative for apnea, cough, choking, chest tightness, shortness of breath and wheezing.   Cardiovascular: Negative for chest pain, palpitations and leg swelling.  Gastrointestinal: Negative for abdominal distention, abdominal pain, constipation, diarrhea and nausea.  Genitourinary: Negative for difficulty urinating, dysuria, frequency and urgency.  Musculoskeletal: Positive for arthralgias (typical arthritis) and joint swelling. Negative for back pain, gait problem and  myalgias.  Skin: Negative for color change, pallor, rash and wound.  Neurological: Negative for dizziness, tremors, syncope, speech difficulty, weakness, numbness and headaches.  Psychiatric/Behavioral: Negative for agitation and behavioral problems.  All other systems reviewed and are negative.   Vitals:   10/30/16 1516  BP: (!) 116/55  Pulse: 83  Resp: 20  Temp: Brittany.1 F (36.7 C)  SpO2: 96%  Weight: 148 lb (67.1 kg)  Height: 5' (1.524 m)   Body mass index is 28.9 kg/m. Physical Exam  Constitutional: She is oriented to person, place, and time. Vital signs are normal. She appears well-developed and well-nourished. She is active and cooperative. She does not appear ill. No distress.  HENT:  Head: Normocephalic and atraumatic.  Mouth/Throat: Uvula is midline, oropharynx is clear and moist and mucous membranes are normal. Mucous membranes are not pale, not dry and not cyanotic.  Eyes: Pupils are equal, round, and reactive to light. Conjunctivae, EOM and lids are normal.  Neck: Trachea normal, normal range of motion and full passive range of motion without pain. Neck supple. No JVD present. No tracheal deviation, no edema and no erythema present. No thyromegaly present.  Cardiovascular: Normal rate, regular rhythm, normal heart sounds, intact distal pulses and normal pulses.  Exam reveals no gallop, no distant  heart sounds and no friction rub.   No murmur heard. Splint in place on RLE- cannot access pulse. Toes warm with cap refills WNL.   Pulmonary/Chest: Effort normal and breath sounds normal. No accessory muscle usage. No respiratory distress. She has no wheezes. She has no rales. She exhibits no tenderness.  Abdominal: Normal appearance and bowel sounds are normal. She exhibits no distension and no ascites. There is no tenderness.  Musculoskeletal: She exhibits no edema.       Right ankle: She exhibits decreased range of motion and swelling. Tenderness.  Expected osteoarthritis, stiffness; calves soft, supple. Negative Homan's sign.  Neurological: She is alert and oriented to person, place, and time. She has normal strength.  Skin: Skin is warm, dry and intact. No rash noted. She is not diaphoretic. No cyanosis or erythema. No pallor. Nails show no clubbing.  Psychiatric: She has a normal mood and affect. Her speech is normal and behavior is normal. Judgment and thought content normal. Cognition and memory are normal.  Nursing note and vitals reviewed.   Labs reviewed: Basic Metabolic Panel:  Recent Labs  06/16/16 0831 10/09/16 0544 10/12/16 0425  NA 134 133* 133*  K 5.0 4.2 3.9  CL 97 102 102  CO2 24 26 25   GLUCOSE 97 128* 92  BUN 19 16 13   CREATININE 0.86 0.76 0.65  CALCIUM 10.1 9.4 9.7   Liver Function Tests:  Recent Labs  06/16/16 0831 10/12/16 0425  AST 27 32  ALT 25 16  ALKPHOS 70 63  BILITOT 0.4 0.9  PROT 6.5 6.4*  ALBUMIN 4.2 3.3*   No results for input(s): LIPASE, AMYLASE in the last 8760 hours. No results for input(s): AMMONIA in the last 8760 hours. CBC:  Recent Labs  10/12/16 0425  WBC 7.6  NEUTROABS 4.5  HGB 11.6*  HCT 33.9*  MCV 93.8  PLT 242   Cardiac Enzymes: No results for input(s): CKTOTAL, CKMB, CKMBINDEX, TROPONINI in the last 8760 hours. BNP: Invalid input(s): POCBNP CBG: No results for input(s): GLUCAP in the last 8760  hours.  Procedures and Imaging Studies During Stay: Dg Ankle 2 Views Right  Result Date: 10/08/2016 CLINICAL DATA:  Open reduction internal fixation  for fractures EXAM: DG C-ARM 61-120 MIN; RIGHT ANKLE - 2 VIEW COMPARISON:  None. FLUOROSCOPY TIME:  0 minutes 36 seconds ; 0.70 mGy; 4 acquired images FINDINGS: Frontal and lateral views obtained. There are 2 screws transfixing a fracture of the medial malleolus. There is screw and plate fixation in the distal fibula transfixing a distal fibular fracture. Alignment at the fracture sites is essentially anatomic. Ankle mortise appears intact. IMPRESSION: Fixation of fractures of the medial malleolus and distal fibula with alignment essentially anatomic on submitted images. Electronically Signed   By: Lowella Grip III M.D.   On: 10/08/2016 16:48   Dg C-arm 1-60 Min  Result Date: 10/08/2016 CLINICAL DATA:  Open reduction internal fixation for fractures EXAM: DG C-ARM 61-120 MIN; RIGHT ANKLE - 2 VIEW COMPARISON:  None. FLUOROSCOPY TIME:  0 minutes 36 seconds ; 0.70 mGy; 4 acquired images FINDINGS: Frontal and lateral views obtained. There are 2 screws transfixing a fracture of the medial malleolus. There is screw and plate fixation in the distal fibula transfixing a distal fibular fracture. Alignment at the fracture sites is essentially anatomic. Ankle mortise appears intact. IMPRESSION: Fixation of fractures of the medial malleolus and distal fibula with alignment essentially anatomic on submitted images. Electronically Signed   By: Lowella Grip III M.D.   On: 10/08/2016 16:48    Assessment/Plan:   1. Closed trimalleolar fracture of right ankle with routine healing, subsequent encounter  Continue PT/OT  Continue exercises as taught by PT/OT  Elevate foot/leg when at rest  Ice Pack PRN for pain, swelling  Continue Hydrocodone/APAP 7.5/325 mg 1 tablet po Q 6 hours prn pain  Continue Methocarbamol 600 mg po Q 6 hours prn pain,  spasms   Patient is being discharged with the following home health services:  HHPT/[facility]  Patient is being discharged with the following durable medical equipment: none- borrowed wheelchair from church   Patient has been advised to f/u with their PCP in 1-2 weeks to bring them up to date on their rehab stay.  Social services at facility was responsible for arranging this appointment.  Pt was provided with a 30 day supply of prescriptions for medications and refills must be obtained from their PCP.  For controlled substances, a more limited supply may be provided adequate until PCP appointment only.  Future labs/tests needed:    Family/ staff Communication:   Total Time:  Documentation:  Face to Face:  Family/Phone:  Vikki Ports, NP-C Geriatrics [facility] [address], [place] [address] Cell Phone (Mon-Fri 8am-5pm):  [phone] On Call:  [phone] & follow prompts after 5pm & weekends Office Phone:  [phone] Office Fax:  [phone]

## 2016-11-03 ENCOUNTER — Telehealth: Payer: Self-pay | Admitting: *Deleted

## 2016-11-03 NOTE — Telephone Encounter (Signed)
Left a VM for [facility] at [facility]. thanks

## 2016-11-03 NOTE — Telephone Encounter (Signed)
[facility] From [facility] stated that pt refused nursing care , however she agreed to have physical and occupational therapy Contact Amy [phone]

## 2016-11-03 NOTE — Telephone Encounter (Signed)
That is patient's decision. I did not place these referrals nor have seen pt regarding this. She had ankle fracture. I defer to patient

## 2016-11-03 NOTE — Telephone Encounter (Signed)
Please advise, thanks.

## 2016-11-03 NOTE — Telephone Encounter (Signed)
Notified [name] of Margaret's statement.

## 2016-11-03 NOTE — Telephone Encounter (Signed)
Please call Amy from [facility]  at [phone]

## 2016-11-04 DIAGNOSIS — H3530 Unspecified macular degeneration: Secondary | ICD-10-CM | POA: Diagnosis not present

## 2016-11-04 DIAGNOSIS — I2510 Atherosclerotic heart disease of native coronary artery without angina pectoris: Secondary | ICD-10-CM | POA: Diagnosis not present

## 2016-11-04 DIAGNOSIS — Z7982 Long term (current) use of aspirin: Secondary | ICD-10-CM | POA: Diagnosis not present

## 2016-11-04 DIAGNOSIS — Z9181 History of falling: Secondary | ICD-10-CM | POA: Diagnosis not present

## 2016-11-04 DIAGNOSIS — I10 Essential (primary) hypertension: Secondary | ICD-10-CM | POA: Diagnosis not present

## 2016-11-04 DIAGNOSIS — M810 Age-related osteoporosis without current pathological fracture: Secondary | ICD-10-CM | POA: Diagnosis not present

## 2016-11-04 DIAGNOSIS — R69 Illness, unspecified: Secondary | ICD-10-CM | POA: Diagnosis not present

## 2016-11-04 DIAGNOSIS — S82851D Displaced trimalleolar fracture of right lower leg, subsequent encounter for closed fracture with routine healing: Secondary | ICD-10-CM | POA: Diagnosis not present

## 2016-11-04 DIAGNOSIS — Z951 Presence of aortocoronary bypass graft: Secondary | ICD-10-CM | POA: Diagnosis not present

## 2016-11-06 DIAGNOSIS — I10 Essential (primary) hypertension: Secondary | ICD-10-CM | POA: Diagnosis not present

## 2016-11-06 DIAGNOSIS — R69 Illness, unspecified: Secondary | ICD-10-CM | POA: Diagnosis not present

## 2016-11-06 DIAGNOSIS — Z9181 History of falling: Secondary | ICD-10-CM | POA: Diagnosis not present

## 2016-11-06 DIAGNOSIS — Z951 Presence of aortocoronary bypass graft: Secondary | ICD-10-CM | POA: Diagnosis not present

## 2016-11-06 DIAGNOSIS — H3530 Unspecified macular degeneration: Secondary | ICD-10-CM | POA: Diagnosis not present

## 2016-11-06 DIAGNOSIS — S82851D Displaced trimalleolar fracture of right lower leg, subsequent encounter for closed fracture with routine healing: Secondary | ICD-10-CM | POA: Diagnosis not present

## 2016-11-06 DIAGNOSIS — I2510 Atherosclerotic heart disease of native coronary artery without angina pectoris: Secondary | ICD-10-CM | POA: Diagnosis not present

## 2016-11-06 DIAGNOSIS — M810 Age-related osteoporosis without current pathological fracture: Secondary | ICD-10-CM | POA: Diagnosis not present

## 2016-11-06 DIAGNOSIS — Z7982 Long term (current) use of aspirin: Secondary | ICD-10-CM | POA: Diagnosis not present

## 2016-11-08 DIAGNOSIS — S82851D Displaced trimalleolar fracture of right lower leg, subsequent encounter for closed fracture with routine healing: Secondary | ICD-10-CM | POA: Diagnosis not present

## 2016-11-08 DIAGNOSIS — I10 Essential (primary) hypertension: Secondary | ICD-10-CM | POA: Diagnosis not present

## 2016-11-08 DIAGNOSIS — H3530 Unspecified macular degeneration: Secondary | ICD-10-CM | POA: Diagnosis not present

## 2016-11-08 DIAGNOSIS — R69 Illness, unspecified: Secondary | ICD-10-CM | POA: Diagnosis not present

## 2016-11-08 DIAGNOSIS — Z951 Presence of aortocoronary bypass graft: Secondary | ICD-10-CM | POA: Diagnosis not present

## 2016-11-08 DIAGNOSIS — M810 Age-related osteoporosis without current pathological fracture: Secondary | ICD-10-CM | POA: Diagnosis not present

## 2016-11-08 DIAGNOSIS — Z9181 History of falling: Secondary | ICD-10-CM | POA: Diagnosis not present

## 2016-11-08 DIAGNOSIS — I2510 Atherosclerotic heart disease of native coronary artery without angina pectoris: Secondary | ICD-10-CM | POA: Diagnosis not present

## 2016-11-08 DIAGNOSIS — Z7982 Long term (current) use of aspirin: Secondary | ICD-10-CM | POA: Diagnosis not present

## 2016-11-10 DIAGNOSIS — M810 Age-related osteoporosis without current pathological fracture: Secondary | ICD-10-CM | POA: Diagnosis not present

## 2016-11-10 DIAGNOSIS — Z951 Presence of aortocoronary bypass graft: Secondary | ICD-10-CM | POA: Diagnosis not present

## 2016-11-10 DIAGNOSIS — Z7982 Long term (current) use of aspirin: Secondary | ICD-10-CM | POA: Diagnosis not present

## 2016-11-10 DIAGNOSIS — I2510 Atherosclerotic heart disease of native coronary artery without angina pectoris: Secondary | ICD-10-CM | POA: Diagnosis not present

## 2016-11-10 DIAGNOSIS — S82851D Displaced trimalleolar fracture of right lower leg, subsequent encounter for closed fracture with routine healing: Secondary | ICD-10-CM | POA: Diagnosis not present

## 2016-11-10 DIAGNOSIS — R69 Illness, unspecified: Secondary | ICD-10-CM | POA: Diagnosis not present

## 2016-11-10 DIAGNOSIS — I10 Essential (primary) hypertension: Secondary | ICD-10-CM | POA: Diagnosis not present

## 2016-11-10 DIAGNOSIS — H3530 Unspecified macular degeneration: Secondary | ICD-10-CM | POA: Diagnosis not present

## 2016-11-10 DIAGNOSIS — Z9181 History of falling: Secondary | ICD-10-CM | POA: Diagnosis not present

## 2016-11-11 ENCOUNTER — Other Ambulatory Visit: Payer: Self-pay | Admitting: Family

## 2016-11-11 DIAGNOSIS — I2510 Atherosclerotic heart disease of native coronary artery without angina pectoris: Secondary | ICD-10-CM | POA: Diagnosis not present

## 2016-11-11 DIAGNOSIS — F411 Generalized anxiety disorder: Secondary | ICD-10-CM

## 2016-11-11 DIAGNOSIS — M810 Age-related osteoporosis without current pathological fracture: Secondary | ICD-10-CM | POA: Diagnosis not present

## 2016-11-11 DIAGNOSIS — Z7982 Long term (current) use of aspirin: Secondary | ICD-10-CM | POA: Diagnosis not present

## 2016-11-11 DIAGNOSIS — H3530 Unspecified macular degeneration: Secondary | ICD-10-CM | POA: Diagnosis not present

## 2016-11-11 DIAGNOSIS — Z9181 History of falling: Secondary | ICD-10-CM | POA: Diagnosis not present

## 2016-11-11 DIAGNOSIS — S82851D Displaced trimalleolar fracture of right lower leg, subsequent encounter for closed fracture with routine healing: Secondary | ICD-10-CM | POA: Diagnosis not present

## 2016-11-11 DIAGNOSIS — R69 Illness, unspecified: Secondary | ICD-10-CM | POA: Diagnosis not present

## 2016-11-11 DIAGNOSIS — I10 Essential (primary) hypertension: Secondary | ICD-10-CM | POA: Diagnosis not present

## 2016-11-11 DIAGNOSIS — Z951 Presence of aortocoronary bypass graft: Secondary | ICD-10-CM | POA: Diagnosis not present

## 2016-11-20 DIAGNOSIS — S82851D Displaced trimalleolar fracture of right lower leg, subsequent encounter for closed fracture with routine healing: Secondary | ICD-10-CM | POA: Diagnosis not present

## 2016-11-24 DIAGNOSIS — Z7982 Long term (current) use of aspirin: Secondary | ICD-10-CM | POA: Diagnosis not present

## 2016-11-24 DIAGNOSIS — S82851D Displaced trimalleolar fracture of right lower leg, subsequent encounter for closed fracture with routine healing: Secondary | ICD-10-CM | POA: Diagnosis not present

## 2016-11-24 DIAGNOSIS — Z9181 History of falling: Secondary | ICD-10-CM | POA: Diagnosis not present

## 2016-11-24 DIAGNOSIS — M810 Age-related osteoporosis without current pathological fracture: Secondary | ICD-10-CM | POA: Diagnosis not present

## 2016-11-24 DIAGNOSIS — H3530 Unspecified macular degeneration: Secondary | ICD-10-CM | POA: Diagnosis not present

## 2016-11-24 DIAGNOSIS — Z951 Presence of aortocoronary bypass graft: Secondary | ICD-10-CM | POA: Diagnosis not present

## 2016-11-24 DIAGNOSIS — I2510 Atherosclerotic heart disease of native coronary artery without angina pectoris: Secondary | ICD-10-CM | POA: Diagnosis not present

## 2016-11-24 DIAGNOSIS — R69 Illness, unspecified: Secondary | ICD-10-CM | POA: Diagnosis not present

## 2016-11-24 DIAGNOSIS — I10 Essential (primary) hypertension: Secondary | ICD-10-CM | POA: Diagnosis not present

## 2016-11-27 NOTE — Telephone Encounter (Signed)
Left pt message asking to call Ebony Hail back directly at [phone] to schedule AWV. Thanks!  *NOTE* Last AWV 01/04/14  Stefannie, Attempted pt scheduled pt 3 times.  Thank you

## 2016-12-10 DIAGNOSIS — S82851D Displaced trimalleolar fracture of right lower leg, subsequent encounter for closed fracture with routine healing: Secondary | ICD-10-CM | POA: Diagnosis not present

## 2016-12-15 DIAGNOSIS — S82851D Displaced trimalleolar fracture of right lower leg, subsequent encounter for closed fracture with routine healing: Secondary | ICD-10-CM | POA: Diagnosis not present

## 2016-12-17 DIAGNOSIS — S82851D Displaced trimalleolar fracture of right lower leg, subsequent encounter for closed fracture with routine healing: Secondary | ICD-10-CM | POA: Diagnosis not present

## 2016-12-22 DIAGNOSIS — S82851D Displaced trimalleolar fracture of right lower leg, subsequent encounter for closed fracture with routine healing: Secondary | ICD-10-CM | POA: Diagnosis not present

## 2016-12-24 ENCOUNTER — Encounter (INDEPENDENT_AMBULATORY_CARE_PROVIDER_SITE_OTHER): Payer: Medicare HMO | Admitting: Ophthalmology

## 2016-12-24 DIAGNOSIS — H353112 Nonexudative age-related macular degeneration, right eye, intermediate dry stage: Secondary | ICD-10-CM

## 2016-12-24 DIAGNOSIS — H35033 Hypertensive retinopathy, bilateral: Secondary | ICD-10-CM | POA: Diagnosis not present

## 2016-12-24 DIAGNOSIS — I10 Essential (primary) hypertension: Secondary | ICD-10-CM

## 2016-12-24 DIAGNOSIS — H353221 Exudative age-related macular degeneration, left eye, with active choroidal neovascularization: Secondary | ICD-10-CM

## 2016-12-24 DIAGNOSIS — H43813 Vitreous degeneration, bilateral: Secondary | ICD-10-CM | POA: Diagnosis not present

## 2016-12-25 DIAGNOSIS — S82851D Displaced trimalleolar fracture of right lower leg, subsequent encounter for closed fracture with routine healing: Secondary | ICD-10-CM | POA: Diagnosis not present

## 2016-12-29 DIAGNOSIS — S82851D Displaced trimalleolar fracture of right lower leg, subsequent encounter for closed fracture with routine healing: Secondary | ICD-10-CM | POA: Diagnosis not present

## 2017-01-05 DIAGNOSIS — S82851D Displaced trimalleolar fracture of right lower leg, subsequent encounter for closed fracture with routine healing: Secondary | ICD-10-CM | POA: Diagnosis not present

## 2017-01-07 DIAGNOSIS — S82851D Displaced trimalleolar fracture of right lower leg, subsequent encounter for closed fracture with routine healing: Secondary | ICD-10-CM | POA: Diagnosis not present

## 2017-01-12 DIAGNOSIS — S82851D Displaced trimalleolar fracture of right lower leg, subsequent encounter for closed fracture with routine healing: Secondary | ICD-10-CM | POA: Diagnosis not present

## 2017-02-05 ENCOUNTER — Telehealth: Payer: Self-pay | Admitting: Family

## 2017-02-05 DIAGNOSIS — Z13820 Encounter for screening for osteoporosis: Secondary | ICD-10-CM

## 2017-02-05 NOTE — Telephone Encounter (Signed)
ordered

## 2017-02-05 NOTE — Telephone Encounter (Signed)
Please advise 

## 2017-02-05 NOTE — Telephone Encounter (Signed)
Mary from [facility] called and is requesting pt get a bone density d/t the fracture that she states the patient had suffered. She said that she needs it by the end of December. Please enter order and I will fax it to them.

## 2017-02-11 DIAGNOSIS — M170 Bilateral primary osteoarthritis of knee: Secondary | ICD-10-CM | POA: Diagnosis not present

## 2017-02-16 ENCOUNTER — Encounter: Payer: Self-pay | Admitting: Family

## 2017-02-18 ENCOUNTER — Other Ambulatory Visit: Payer: Medicare HMO

## 2017-02-19 ENCOUNTER — Encounter: Payer: Self-pay | Admitting: Family

## 2017-03-18 ENCOUNTER — Encounter (INDEPENDENT_AMBULATORY_CARE_PROVIDER_SITE_OTHER): Payer: Medicare HMO | Admitting: Ophthalmology

## 2017-03-18 DIAGNOSIS — I10 Essential (primary) hypertension: Secondary | ICD-10-CM | POA: Diagnosis not present

## 2017-03-18 DIAGNOSIS — H43813 Vitreous degeneration, bilateral: Secondary | ICD-10-CM | POA: Diagnosis not present

## 2017-03-18 DIAGNOSIS — H353221 Exudative age-related macular degeneration, left eye, with active choroidal neovascularization: Secondary | ICD-10-CM

## 2017-03-18 DIAGNOSIS — H353112 Nonexudative age-related macular degeneration, right eye, intermediate dry stage: Secondary | ICD-10-CM | POA: Diagnosis not present

## 2017-03-18 DIAGNOSIS — H35033 Hypertensive retinopathy, bilateral: Secondary | ICD-10-CM

## 2017-03-23 ENCOUNTER — Ambulatory Visit (INDEPENDENT_AMBULATORY_CARE_PROVIDER_SITE_OTHER): Payer: Medicare HMO | Admitting: Internal Medicine

## 2017-03-23 ENCOUNTER — Encounter: Payer: Self-pay | Admitting: Internal Medicine

## 2017-03-23 VITALS — BP 140/84 | HR 67 | Temp 97.9°F | Resp 18 | Wt 143.1 lb

## 2017-03-23 DIAGNOSIS — Z1231 Encounter for screening mammogram for malignant neoplasm of breast: Secondary | ICD-10-CM

## 2017-03-23 DIAGNOSIS — M545 Low back pain: Secondary | ICD-10-CM

## 2017-03-23 DIAGNOSIS — Z0000 Encounter for general adult medical examination without abnormal findings: Secondary | ICD-10-CM

## 2017-03-23 DIAGNOSIS — Z8639 Personal history of other endocrine, nutritional and metabolic disease: Secondary | ICD-10-CM

## 2017-03-23 DIAGNOSIS — Z124 Encounter for screening for malignant neoplasm of cervix: Secondary | ICD-10-CM

## 2017-03-23 DIAGNOSIS — Z87898 Personal history of other specified conditions: Secondary | ICD-10-CM

## 2017-03-23 DIAGNOSIS — R7303 Prediabetes: Secondary | ICD-10-CM

## 2017-03-23 DIAGNOSIS — I2510 Atherosclerotic heart disease of native coronary artery without angina pectoris: Secondary | ICD-10-CM

## 2017-03-23 DIAGNOSIS — M8580 Other specified disorders of bone density and structure, unspecified site: Secondary | ICD-10-CM

## 2017-03-23 DIAGNOSIS — G8929 Other chronic pain: Secondary | ICD-10-CM | POA: Diagnosis not present

## 2017-03-23 DIAGNOSIS — R87619 Unspecified abnormal cytological findings in specimens from cervix uteri: Secondary | ICD-10-CM

## 2017-03-23 MED ORDER — ZOSTER VAC RECOMB ADJUVANTED 50 MCG/0.5ML IM SUSR: 0.5000 mL | Freq: Once | INTRAMUSCULAR | 0 refills | Status: AC

## 2017-03-23 NOTE — Progress Notes (Addendum)
Chief Complaint  Patient presents with  . Annual Exam  . Back Pain    low chronic Dr. Almedia Balls   CPE 1. Low back pain chronic follows with Almedia Balls back pain worse with temp changes. Reviewed CT ab/pelvis 05/19/11 DDD L4/5     Review of Systems  Constitutional: Negative for weight loss.  HENT: Negative for hearing loss.   Eyes:       Denies vision changes   Respiratory: Negative for shortness of breath.   Cardiovascular: Negative for chest pain.  Gastrointestinal: Negative for abdominal pain.  Musculoskeletal: Positive for back pain.  Skin: Negative for rash.  Endo/Heme/Allergies: Bruises/bleeds easily.  Psychiatric/Behavioral: Negative for depression.   Past Medical History:  Diagnosis Date  . Abnormal Pap smear of cervix    Dr. Prentice Docker   . Coronary artery disease 2011   CABG x 3  . DDD (degenerative disc disease), lumbar   . Degenerative disc disease, lumbar    L4/5 noted CT ab/pelvis 05/19/11   . Hyperlipidemia   . Hypertension    under control  . Macular degeneration   . MI (myocardial infarction) ([place])    stents x 3 06/2009 [facility], s/p CABG x 3   . Parathyroid adenoma    right 13 x 8 mm s/p resection   . PVD (peripheral vascular disease) ([place])    Past Surgical History:  Procedure Laterality Date  . CARDIAC CATHETERIZATION  2013   [facility]  . CATARACT EXTRACTION    . CORONARY ARTERY BYPASS GRAFT  02/07/2010   x 3  . FACIAL COSMETIC SURGERY  2013   Dr. Nadeen Landau  . FOOT SURGERY     right  . ILIO-FEMORAL BYPASS GRAFT  2011  . MITRAL VALVE REPAIR  01/2010  . ORIF ANKLE FRACTURE Right 10/08/2016   Procedure: OPEN REDUCTION INTERNAL FIXATION (ORIF) ANKLE FRACTURE;  Surgeon: Corky Mull, MD;  Location: [facility] ORS;  Service: Orthopedics;  Laterality: Right;  . PARATHYROIDECTOMY    . TUBAL LIGATION     Family History  Family history unknown: Yes   Social History   Socioeconomic History  . Marital status: Unknown    Spouse name: Not on file  .  Number of children: Not on file  . Years of education: Not on file  . Highest education level: Not on file  Social Needs  . Financial resource strain: Not on file  . Food insecurity - worry: Not on file  . Food insecurity - inability: Not on file  . Transportation needs - medical: Not on file  . Transportation needs - non-medical: Not on file  Occupational History  . Not on file  Tobacco Use  . Smoking status: Former Smoker    Packs/day: 1.00    Years: 40.00    Pack years: 40.00    Types: Cigarettes    Last attempt to quit: 07/15/2009    Years since quitting: 7.6  . Smokeless tobacco: Never Used  . Tobacco comment: quit 06/2009 smoked from 20s to 2011 1.5 ppd no FH lung cancer   Substance and Sexual Activity  . Alcohol use: Yes    Alcohol/week: 0.5 oz    Types: 1 Standard drinks or equivalent per week    Comment: occ.  . Drug use: No  . Sexual activity: Not on file  Other Topics Concern  . 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    Exercise- walks daily , wears fit bit.    Current Meds  Medication Sig  . aspirin 81 MG EC tablet Take 81 mg by mouth daily.   . Cholecalciferol (VITAMIN D3) 1000 UNITS CAPS Take 1,000 Units by mouth daily.   . Coenzyme Q10 100 MG capsule Take 100 mg by mouth daily.   Marland Kitchen HYDROcodone-acetaminophen (NORCO) 7.5-325 MG tablet Take 1 tablet by mouth every 6 (six) hours as needed for moderate pain.  [name] Oil 350 MG CAPS Take 1 capsule by mouth daily.  Marland Kitchen KRILL OIL PO Take 1 tablet by mouth daily.  . metoprolol tartrate (LOPRESSOR) 25 MG tablet Take 12.5 mg by mouth 2 (two) times daily. Take 1/2 tablet  . Multiple Vitamins-Minerals (PRESERVISION AREDS 2) CAPS Take 1 capsule by mouth 2 (two) times daily.  . pantoprazole (PROTONIX) 40 MG tablet TAKE ONE (1) TABLET BY MOUTH EVERY DAY  . Probiotic Product (RISA-BID PROBIOTIC PO) Take 250 mg by mouth 2 (two)  times daily.   . rosuvastatin (CRESTOR) 40 MG tablet TAKE ONE (1) TABLET EACH DAY  . sertraline (ZOLOFT) 50 MG tablet TAKE TWO TABLETS BY MOUTH AT BEDTIME DAILY   No Known Allergies No results found for this or any previous visit (from the past 2160 hour(s)). Objective  Body mass index is 27.95 kg/m. Wt Readings from Last 3 Encounters:  03/23/17 143 lb 2 oz (64.9 kg)  10/30/16 148 lb (67.1 kg)  10/27/16 148 lb 9.6 oz (67.4 kg)   Temp Readings from Last 3 Encounters:  03/23/17 97.9 F (36.6 C) (Oral)  10/30/16 98.1 F (36.7 C)  10/27/16 (!) 97.5 F (36.4 C)   BP Readings from Last 3 Encounters:  03/23/17 140/84  10/30/16 (!) 116/55  10/27/16 108/67   Pulse Readings from Last 3 Encounters:  03/23/17 67  10/30/16 83  10/27/16 74   Pulse oximetry on room air is 96% Physical Exam  Constitutional: She is oriented to person, place, and time and well-developed, well-nourished, and in no distress. Vital signs are normal.  HENT:  Head: Normocephalic and atraumatic.  Mouth/Throat: Oropharynx is clear and moist and mucous membranes are normal.  Eyes: Conjunctivae are normal. Pupils are equal, round, and reactive to light.  Cardiovascular: Normal rate, regular rhythm and normal heart sounds.  No murmur heard. Neg leg edema b/l   Pulmonary/Chest: Effort normal and breath sounds normal. She exhibits no mass. Right breast exhibits no inverted nipple, no mass, no nipple discharge, no skin change and no tenderness. Left breast exhibits no inverted nipple, no mass, no nipple discharge, no skin change and no tenderness. Breasts are symmetrical.  Abdominal: Soft. Bowel sounds are normal. There is no tenderness.  Neurological: She is alert and oriented to person, place, and time. Gait normal.  Skin: Skin is warm, dry and intact.  Angiomas trunk  Psychiatric: Mood, memory, affect and judgment normal.  Nursing note and vitals reviewed.  Assessment   1. H/o abnormal pap ASCUS  2. Chronic  low back pain  3. Prediabetes 4.h/o CAD s/p stents x 3 and PVD s/p bypass  5. HM  6. H/o hypercalcemia and parathyroid adenoma   Plan  1.  Refer back to Dr. Prentice Docker for f/u  2.  F/u Dr. Almedia Balls  3. Repeat A1C Thursday [facility] fasting given form  4.  Cont meds doing well  5.  [facility] form for labs 03/25/17 CMET, CBC, UA, TSH, T4, anemia, ferritin,  IBC, iron, A1C, lipid, Hep B titer, vitamin D   Had flu shot 01/28/17, pt to confirm date of pna 23   Had prevnar, Tdap, zoster Given Rx shingrix today and disc   colonoscpopy had 11/04/12 diverticulosis, IH repeat 10 years   Referred mammo and dexa ([facility]) previous mammo 03/03/16 neg and DEXA 11/14/13 osteopenia takes vit D 1000 IU but not calcium due to h/o elevated calcium   Refer back to OB/GYN for f/u abnormal pap   Disc CT chest pt to consider with smoking hx   Hep C neg 10/03/15   6. Will check repeat BMET, Phos, ionized Ca, PTH, 1/25 vitamin D in 2 weeks, 25 vit D normal.  Also consider check SPEP UPEP I nfuture and 24 hr urinary Ca May need to f/u with endocrine in future   Provider: Dr. Olivia Mackie McLean-Scocuzza

## 2017-03-23 NOTE — Patient Instructions (Addendum)
Labs Thursday  We will refer you back to Dr. Prentice Docker h/o abnormal pap  We will refer for mammo and bone density Think about CT chest for lung cancer screening  Think and check around for Shingrix  Find out date of pneumonia 23 vaccine  Follow up in 3-6 months    Back Pain, Adult Back pain is very common. The pain often gets better over time. The cause of back pain is usually not dangerous. Most people can learn to manage their back pain on their own. Follow these instructions at home: Watch your back pain for any changes. The following actions may help to lessen any pain you are feeling:  Stay active. Start with short walks on flat ground if you can. Try to walk farther each day.  Exercise regularly as told by your doctor. Exercise helps your back heal faster. It also helps avoid future injury by keeping your muscles strong and flexible.  Do not sit, drive, or stand in one place for more than 30 minutes.  Do not stay in bed. Resting more than 1-2 days can slow down your recovery.  Be careful when you bend or lift an object. Use good form when lifting: ? Bend at your knees. ? Keep the object close to your body. ? Do not twist.  Sleep on a firm mattress. Lie on your side, and bend your knees. If you lie on your back, put a pillow under your knees.  Take medicines only as told by your doctor.  Put ice on the injured area. ? Put ice in a plastic bag. ? Place a towel between your skin and the bag. ? Leave the ice on for 20 minutes, 2-3 times a day for the first 2-3 days. After that, you can switch between ice and heat packs.  Avoid feeling anxious or stressed. Find good ways to deal with stress, such as exercise.  Maintain a healthy weight. Extra weight puts stress on your back.  Contact a doctor if:  You have pain that does not go away with rest or medicine.  You have worsening pain that goes down into your legs or buttocks.  You have pain that does not get better in  one week.  You have pain at night.  You lose weight.  You have a fever or chills. Get help right away if:  You cannot control when you poop (bowel movement) or pee (urinate).  Your arms or legs feel weak.  Your arms or legs lose feeling (numbness).  You feel sick to your stomach (nauseous) or throw up (vomit).  You have belly (abdominal) pain.  You feel like you may pass out (faint). This information is not intended to replace advice given to you by your health care provider. Make sure you discuss any questions you have with your health care provider. Document Released: 09/23/2007 Document Revised: 09/12/2015 Document Reviewed: 08/08/2013 Elsevier Interactive Patient Education  [employer].

## 2017-03-24 ENCOUNTER — Telehealth: Payer: Self-pay | Admitting: Obstetrics and Gynecology

## 2017-03-24 NOTE — Telephone Encounter (Signed)
[facility] referring for Cervical screening ,Refer Dr. Prentice Docker h/o abnormal pap ASCUS per our records. Called and left voicemail for patient to call back to be schedule

## 2017-03-25 ENCOUNTER — Other Ambulatory Visit: Payer: Self-pay | Admitting: Family

## 2017-03-25 DIAGNOSIS — Z1329 Encounter for screening for other suspected endocrine disorder: Secondary | ICD-10-CM | POA: Diagnosis not present

## 2017-03-25 DIAGNOSIS — D649 Anemia, unspecified: Secondary | ICD-10-CM | POA: Diagnosis not present

## 2017-03-25 DIAGNOSIS — I2510 Atherosclerotic heart disease of native coronary artery without angina pectoris: Secondary | ICD-10-CM | POA: Diagnosis not present

## 2017-03-25 DIAGNOSIS — Z1159 Encounter for screening for other viral diseases: Secondary | ICD-10-CM | POA: Diagnosis not present

## 2017-03-25 DIAGNOSIS — G629 Polyneuropathy, unspecified: Secondary | ICD-10-CM | POA: Diagnosis not present

## 2017-03-25 DIAGNOSIS — R7303 Prediabetes: Secondary | ICD-10-CM | POA: Diagnosis not present

## 2017-03-26 LAB — COMPREHENSIVE METABOLIC PANEL
ALBUMIN: 4.5 g/dL (ref 3.6–4.8)
ALK PHOS: 80 IU/L (ref 39–117)
ALT: 18 IU/L (ref 0–32)
AST: 20 IU/L (ref 0–40)
Albumin/Globulin Ratio: 2.3 — ABNORMAL HIGH (ref 1.2–2.2)
BUN / CREAT RATIO: 28 (ref 12–28)
BUN: 22 mg/dL (ref 8–27)
Bilirubin Total: 0.3 mg/dL (ref 0.0–1.2)
CO2: 22 mmol/L (ref 20–29)
CREATININE: 0.79 mg/dL (ref 0.57–1.00)
Calcium: 10.6 mg/dL — ABNORMAL HIGH (ref 8.7–10.3)
Chloride: 101 mmol/L (ref 96–106)
GFR, EST AFRICAN AMERICAN: 88 mL/min/{1.73_m2} (ref >59)
GFR, EST NON AFRICAN AMERICAN: 77 mL/min/{1.73_m2} (ref >59)
GLOBULIN, TOTAL: 2 g/dL (ref 1.5–4.5)
GLUCOSE: 99 mg/dL (ref 65–99)
Potassium: 4.2 mmol/L (ref 3.5–5.2)
SODIUM: 138 mmol/L (ref 134–144)
TOTAL PROTEIN: 6.5 g/dL (ref 6.0–8.5)

## 2017-03-26 LAB — MICROSCOPIC EXAMINATION: CASTS: NONE SEEN /LPF

## 2017-03-26 LAB — CBC WITH DIFFERENTIAL/PLATELET
Basophils Absolute: 0 10*3/uL (ref 0.0–0.2)
Basos: 0 %
EOS (ABSOLUTE): 0 10*3/uL (ref 0.0–0.4)
EOS: 0 %
HEMATOCRIT: 39.4 % (ref 34.0–46.6)
HEMOGLOBIN: 13 g/dL (ref 11.1–15.9)
Immature Grans (Abs): 0 10*3/uL (ref 0.0–0.1)
Immature Granulocytes: 0 %
LYMPHS ABS: 1.8 10*3/uL (ref 0.7–3.1)
Lymphs: 22 %
MCH: 30.5 pg (ref 26.6–33.0)
MCHC: 33 g/dL (ref 31.5–35.7)
MCV: 93 fL (ref 79–97)
MONOCYTES: 13 %
Monocytes Absolute: 1.1 10*3/uL — ABNORMAL HIGH (ref 0.1–0.9)
NEUTROS ABS: 5.2 10*3/uL (ref 1.4–7.0)
Neutrophils: 65 %
Platelets: 239 10*3/uL (ref 150–379)
RBC: 4.26 x10E6/uL (ref 3.77–5.28)
RDW: 15.6 % — ABNORMAL HIGH (ref 12.3–15.4)
WBC: 8.2 10*3/uL (ref 3.4–10.8)

## 2017-03-26 LAB — HEPATITIS B SURFACE ANTIBODY, QUANTITATIVE

## 2017-03-26 LAB — URINALYSIS, ROUTINE W REFLEX MICROSCOPIC
Bilirubin, UA: NEGATIVE
Glucose, UA: NEGATIVE
KETONES UA: NEGATIVE
NITRITE UA: NEGATIVE
PH UA: 6 (ref 5.0–7.5)
Protein, UA: NEGATIVE
RBC, UA: NEGATIVE
SPEC GRAV UA: 1.016 (ref 1.005–1.030)
Urobilinogen, Ur: 0.2 mg/dL (ref 0.2–1.0)

## 2017-03-26 LAB — IRON AND TIBC
IRON SATURATION: 12 % — AB (ref 15–55)
IRON: 37 ug/dL (ref 27–139)
Total Iron Binding Capacity: 300 ug/dL (ref 250–450)
UIBC: 263 ug/dL (ref 118–369)

## 2017-03-26 LAB — TSH: TSH: 0.948 u[IU]/mL (ref 0.450–4.500)

## 2017-03-26 LAB — VITAMIN D 25 HYDROXY (VIT D DEFICIENCY, FRACTURES): Vit D, 25-Hydroxy: 41.3 ng/mL (ref 30.0–100.0)

## 2017-03-26 LAB — LIPID PANEL W/O CHOL/HDL RATIO
CHOLESTEROL TOTAL: 145 mg/dL (ref 100–199)
HDL: 78 mg/dL (ref >39)
LDL Calculated: 53 mg/dL (ref 0–99)
Triglycerides: 70 mg/dL (ref 0–149)
VLDL CHOLESTEROL CAL: 14 mg/dL (ref 5–40)

## 2017-03-26 LAB — FERRITIN: Ferritin: 49 ng/mL (ref 15–150)

## 2017-03-26 LAB — HGB A1C W/O EAG: Hgb A1c MFr Bld: 5.6 % (ref 4.8–5.6)

## 2017-03-26 LAB — T4, FREE: FREE T4: 1.15 ng/dL (ref 0.82–1.77)

## 2017-03-31 ENCOUNTER — Other Ambulatory Visit: Payer: Self-pay | Admitting: Internal Medicine

## 2017-03-31 DIAGNOSIS — D351 Benign neoplasm of parathyroid gland: Secondary | ICD-10-CM

## 2017-03-31 DIAGNOSIS — D352 Benign neoplasm of pituitary gland: Secondary | ICD-10-CM

## 2017-04-01 DIAGNOSIS — Z1231 Encounter for screening mammogram for malignant neoplasm of breast: Secondary | ICD-10-CM | POA: Diagnosis not present

## 2017-04-01 NOTE — Telephone Encounter (Signed)
Pt is schedule 04/22/16 with Dr. Glennon Mac

## 2017-04-06 ENCOUNTER — Encounter: Payer: Self-pay | Admitting: Internal Medicine

## 2017-04-06 DIAGNOSIS — Z780 Asymptomatic menopausal state: Secondary | ICD-10-CM | POA: Diagnosis not present

## 2017-04-06 DIAGNOSIS — M8588 Other specified disorders of bone density and structure, other site: Secondary | ICD-10-CM | POA: Diagnosis not present

## 2017-04-09 ENCOUNTER — Telehealth: Payer: Self-pay | Admitting: *Deleted

## 2017-04-09 NOTE — Telephone Encounter (Signed)
Copied from [place] [id number]. Topic: Inquiry >> Apr 09, 2017 12:10 PM Aurelio Brash B wrote: Mary from [place] called asking if the pts bone density test has been scheduled   [phone]

## 2017-04-14 NOTE — Telephone Encounter (Signed)
Tried calling number [name] from [place] back  not working , called and spoke with Lelon Frohlich at [facility] on provider line  and advised that was already done 04/06/17 at [facility] she couldn't pull up reference # in regards to call .

## 2017-04-15 ENCOUNTER — Encounter: Payer: Self-pay | Admitting: Family

## 2017-04-22 ENCOUNTER — Ambulatory Visit (INDEPENDENT_AMBULATORY_CARE_PROVIDER_SITE_OTHER): Payer: Medicare HMO | Admitting: Obstetrics and Gynecology

## 2017-04-22 ENCOUNTER — Encounter: Payer: Self-pay | Admitting: Obstetrics and Gynecology

## 2017-04-22 VITALS — BP 134/86 | Ht 60.0 in | Wt 144.0 lb

## 2017-04-22 DIAGNOSIS — R87619 Unspecified abnormal cytological findings in specimens from cervix uteri: Secondary | ICD-10-CM | POA: Insufficient documentation

## 2017-04-22 DIAGNOSIS — R87810 Cervical high risk human papillomavirus (HPV) DNA test positive: Secondary | ICD-10-CM

## 2017-04-22 DIAGNOSIS — R69 Illness, unspecified: Secondary | ICD-10-CM | POA: Diagnosis not present

## 2017-04-22 DIAGNOSIS — N870 Mild cervical dysplasia: Secondary | ICD-10-CM

## 2017-04-22 DIAGNOSIS — R87612 Low grade squamous intraepithelial lesion on cytologic smear of cervix (LGSIL): Secondary | ICD-10-CM | POA: Diagnosis not present

## 2017-04-22 DIAGNOSIS — R87610 Atypical squamous cells of undetermined significance on cytologic smear of cervix (ASC-US): Secondary | ICD-10-CM | POA: Diagnosis not present

## 2017-04-22 NOTE — Progress Notes (Signed)
Obstetrics & Gynecology Office Visit   Chief Complaint  Patient presents with  . Follow-up    pap smear follow up   History of Present Illness: 72 y.o. G1P1000 female with [place] + in 2017. She underwent colposcopy with CIN 1 as a result.  She is here for follow up.  She has no complaints. Denies vaginal bleeding.   Past Medical History:  Diagnosis Date  . Abnormal Pap smear of cervix    Dr. Prentice Docker   . Coronary artery disease 2011   CABG x 3  . DDD (degenerative disc disease), lumbar   . Degenerative disc disease, lumbar    L4/5 noted CT ab/pelvis 05/19/11   . Hyperlipidemia   . Hypertension    under control  . Macular degeneration   . MI (myocardial infarction) ([place])    stents x 3 06/2009 [facility], s/p CABG x 3   . Parathyroid adenoma    right 13 x 8 mm s/p resection   . PVD (peripheral vascular disease) ([place])     Past Surgical History:  Procedure Laterality Date  . CARDIAC CATHETERIZATION  2013   [facility]  . CATARACT EXTRACTION    . COLPOSCOPY    . CORONARY ARTERY BYPASS GRAFT  02/07/2010   x 3  . FACIAL COSMETIC SURGERY  2013   Dr. Nadeen Landau  . FOOT SURGERY     right  . ILIO-FEMORAL BYPASS GRAFT  2011  . MITRAL VALVE REPAIR  01/2010  . ORIF ANKLE FRACTURE Right 10/08/2016   Procedure: OPEN REDUCTION INTERNAL FIXATION (ORIF) ANKLE FRACTURE;  Surgeon: Corky Mull, MD;  Location: [facility] ORS;  Service: Orthopedics;  Laterality: Right;  . PARATHYROIDECTOMY    . TUBAL LIGATION      Gynecologic History: No LMP recorded. Patient is postmenopausal.  Obstetric History: G1P1000  Family History  Family history unknown: Yes    Social History   Socioeconomic History  . Marital status: Unknown    Spouse name: Not on file  . Number of children: Not on file  . Years of education: Not on file  . Highest education level: Not on file  Social Needs  . Financial resource strain: Not on file  . Food insecurity - worry: Not on file  . Food insecurity -  inability: Not on file  . Transportation needs - medical: Not on file  . Transportation needs - non-medical: Not on file  Occupational History  . Not on file  Tobacco Use  . Smoking status: Former Smoker    Packs/day: 1.00    Years: 40.00    Pack years: 40.00    Types: Cigarettes    Last attempt to quit: 07/15/2009    Years since quitting: 7.7  . Smokeless tobacco: Never Used  . Tobacco comment: quit 06/2009 smoked from 20s to 2011 1.5 ppd no FH lung cancer   Substance and Sexual Activity  . Alcohol use: Yes    Alcohol/week: 0.5 oz    Types: 1 Standard drinks or equivalent per week    Comment: occ.  . Drug use: No  . Sexual activity: Not Currently    Birth control/protection: Post-menopausal  Other Topics Concern  . 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    Exercise- walks daily , wears fit bit.     No Known Allergies  Prior to Admission medications   Medication  Sig Start Date End Date Taking? Authorizing Provider  ACIDOPHILUS LACTOBACILLUS PO Take 1 tablet by mouth daily.    [provider]  aspirin 81 MG EC tablet Take 81 mg by mouth daily.     [provider]  Cholecalciferol (VITAMIN D3) 1000 UNITS CAPS Take 1,000 Units by mouth daily.     [provider]  Coenzyme Q10 100 MG capsule Take 100 mg by mouth daily.     [provider]  HYDROcodone-acetaminophen (NORCO) 7.5-325 MG tablet Take 1 tablet by mouth every 6 (six) hours as needed for moderate pain. 10/09/16   Reche Dixon, PA-C  Krill Oil 350 MG CAPS Take 1 capsule by mouth daily.    [provider]  KRILL OIL PO Take 1 tablet by mouth daily.    [provider]  metoprolol tartrate (LOPRESSOR) 25 MG tablet Take 12.5 mg by mouth 2 (two) times daily. Take 1/2 tablet    [provider]  Multiple Vitamins-Minerals (PRESERVISION AREDS 2) CAPS Take 1 capsule by  mouth 2 (two) times daily.    [provider]  pantoprazole (PROTONIX) 40 MG tablet TAKE ONE (1) TABLET BY MOUTH EVERY DAY 06/02/16   Burnard Hawthorne, FNP  Probiotic Product (RISA-BID PROBIOTIC PO) Take 250 mg by mouth 2 (two) times daily.     [provider]  rosuvastatin (CRESTOR) 40 MG tablet TAKE ONE (1) TABLET EACH DAY 05/05/16   Minna Merritts, MD  sertraline (ZOLOFT) 50 MG tablet TAKE TWO TABLETS BY MOUTH AT BEDTIME DAILY 11/11/16   Burnard Hawthorne, FNP    Review of Systems  Constitutional: Negative.   Gastrointestinal: Negative.   Genitourinary:       Per HPI  Skin: Negative.      Physical Exam BP 134/86   Ht 5' (1.524 m)   Wt 144 lb (65.3 kg)   BMI 28.12 kg/m  No LMP recorded. Patient is postmenopausal. Physical Exam  Constitutional: She is oriented to person, place, and time. She appears well-developed and well-nourished.  Genitourinary: Vagina normal and uterus normal. Pelvic exam was performed with patient supine. There is no rash, tenderness or lesion on the right labia. There is no rash, tenderness or lesion on the left labia. Right adnexum does not display mass, does not display tenderness and does not display fullness. Left adnexum does not display mass, does not display tenderness and does not display fullness. Cervix does not exhibit motion tenderness, lesion or polyp.   Uterus is mobile and anteverted. Uterus is not enlarged, tender, exhibiting a mass or irregular (is regular).  HENT:  Head: Normocephalic and atraumatic.  Eyes: Conjunctivae are normal. No scleral icterus.  Pulmonary/Chest: No respiratory distress.  Abdominal: Soft. She exhibits no distension and no mass. There is no tenderness. There is no rebound and no guarding.  Musculoskeletal: Normal range of motion. She exhibits no edema.  Neurological: She is alert and oriented to person, place, and time. No cranial nerve deficit.  Psychiatric: She has a normal mood and affect. Her  behavior is normal. Judgment normal.   Female chaperone present for pelvic and breast  portions of the physical exam  Assessment: 72 y.o. G46P1000 female here for  1. Atypical squamous cells of undetermined significance on cytologic smear of cervix (ASC-US)   2. Cervical high risk HPV (human papillomavirus) test positive   3. Dysplasia of cervix, low grade (CIN 1)      Plan: Problem List Items Addressed This Visit  Abnormal Pap smear of cervix - Primary   Relevant Orders   IGP, Aptima HPV   Cervical high risk HPV (human papillomavirus) test positive   Relevant Orders   IGP, Aptima HPV    Other Visit Diagnoses    Dysplasia of cervix, low grade (CIN 1)       Relevant Orders   IGP, Aptima HPV     Pap smear with obligate HPV per ASCCP guidelines.  Follow up based on results.  Prentice Docker, MD 04/22/2017 11:11 AM      CC: Orland Mustard, MD

## 2017-04-23 ENCOUNTER — Encounter: Payer: Self-pay | Admitting: Family

## 2017-04-24 LAB — IGP, APTIMA HPV
HPV Aptima: POSITIVE — AB
PAP SMEAR COMMENT: 0

## 2017-04-27 ENCOUNTER — Telehealth: Payer: Self-pay

## 2017-04-27 NOTE — Telephone Encounter (Signed)
-----   Message from Delorise Jackson, MD sent at 04/21/2017  2:19 PM EST ----- Bone density +osteopenia/bone loss rec calcium 600 mg 2x per day and vit D3 at least D3 1000 iu daily  Thanks [place]

## 2017-04-27 NOTE — Telephone Encounter (Signed)
Copied from [place] [id number]. Topic: Quick Communication - Office Called Patient >> Apr 27, 2017  2:21 PM Robina Ade, Helene Kelp D wrote: Reason for CRM: Patient called and was returning [place] phone call. Please call patient back, thanks.

## 2017-04-27 NOTE — Telephone Encounter (Signed)
Left message to return call 

## 2017-04-27 NOTE — Telephone Encounter (Signed)
Left voice mail to call back ok for [facility] to speak to patient 

## 2017-04-28 ENCOUNTER — Telehealth: Payer: Self-pay | Admitting: Family

## 2017-04-28 NOTE — Telephone Encounter (Signed)
Advised patient of bone density results , she states she has previously took Fosamax and is hesistant to take .   She wants to know side effects of Boniva.

## 2017-04-28 NOTE — Telephone Encounter (Signed)
[name] phoned for bone density results from 12/13's [facility]  If you can release results I would be glad to call her back today as she's anxious for the information.

## 2017-04-28 NOTE — Telephone Encounter (Signed)
Please advise 

## 2017-04-29 ENCOUNTER — Encounter: Payer: Self-pay | Admitting: Obstetrics and Gynecology

## 2017-05-13 ENCOUNTER — Telehealth: Payer: Self-pay | Admitting: Cardiovascular Disease

## 2017-05-13 ENCOUNTER — Other Ambulatory Visit: Payer: Self-pay | Admitting: *Deleted

## 2017-05-13 MED ORDER — ROSUVASTATIN CALCIUM 40 MG PO TABS: ORAL_TABLET | ORAL | 3 refills | Status: DC

## 2017-05-13 MED ORDER — METOPROLOL TARTRATE 25 MG PO TABS: 12.5000 mg | ORAL_TABLET | Freq: Two times a day (BID) | ORAL | 2 refills | Status: DC

## 2017-05-13 NOTE — Telephone Encounter (Signed)
°*  STAT* If patient is at the pharmacy, call can be transferred to refill team.   1. Which medications need to be refilled? (please list name of each medication and dose if known)  Metoprolol tartrate (LOPRESSOR) 25 MG Rosuvastatin (CRESTOR) 40 MG  2. Which pharmacy/location (including street and city if local pharmacy) is medication to be sent to? [place]  3. Do they need a 30 day or 90 day supply? 90 day  Patient states she may already have the refills She cannot get in contact with [place] possibly due to the switch of companies She said she will try and get out today to check but wanted to know if we can get in touch She is out of blood thinner Please contact

## 2017-05-13 NOTE — Telephone Encounter (Signed)
Requested Prescriptions   Signed Prescriptions Disp Refills  . rosuvastatin (CRESTOR) 40 MG tablet 90 tablet 3    Sig: TAKE ONE (1) TABLET EACH DAY    Authorizing Provider: Minna Merritts    Ordering User: Rage Beever C  . metoprolol tartrate (LOPRESSOR) 25 MG tablet 90 tablet 2    Sig: Take 0.5 tablets (12.5 mg total) by mouth 2 (two) times daily.    Authorizing Provider: Minna Merritts    Ordering User: Britt Bottom

## 2017-05-13 NOTE — Telephone Encounter (Signed)
I spoke with pt and she is aware that [facility] is not appearing in our Pharmacy list in our system at the time.  I contacted [facility] for more information but there was no answer to verify where to send Rx. She mentioned that she believes pharmacy has been bought out. She will contact office to confirm where she would like her Rx to go.

## 2017-05-16 NOTE — Progress Notes (Signed)
Cardiology Office Note  Date:  05/18/2017   ID:  [name], DOB June 30, 1945, MRN [id number]  PCP:  Burnard Hawthorne, FNP   Chief Complaint  Patient presents with  . Other    6 month follow up. Patient denies chest pain and SOB. Meds reviewed verbally with patient.     HPI:  [name] is a pleasant 72 year old woman with a history of  coronary artery disease, stent to her left circumflex and 2 stents to the LAD in March of 2011, aorto femoral bypass grafting in early October 2011,  with occlusion of her coronary stents in mid-October requiring bypass surgery at [facility] in mid to late October 2012.  Cath 2013: occluded SVG to PDA thyroid surgery March 2014 long history of smoking though stopped in March of 2011 plastic surgery on her face in the past She presents for routine followup of her coronary artery disease.  Last stress test 2013 Cardiac catheterization October 2013  Right foot fracture 3 breaks, extensive rehab   Denies any significant chest pain No regular exercise program  Lab work reviewed with her in detail Total chol 140s, LDL less than 70 BMP normal, glu <100 HBA1C 5.6  History of Macular degeneration, getting eye shots Had cataract surg, now eyes are doing well  Chronic back pain Previously buying products from [employer] for weight loss   EKG personally reviewed by myself on todays visit Shows normal sinus rhythm rate 71 bpm left axis deviation  Other past medical history cardiac catheterization October 2013 showed LIMA to the LAD, vein graft to the left circumflex, vein graft to the PDA which is occluded that supplies a very small area. Left main has 50% disease, ostial LAD has 95% in-stent restenosis, mid LAD has 100% disease, 75% ostial circumflex, 100% mid circumflex, very small OM1, 30% RCA disease, ejection fraction 45%  Stress test shows suboptimal images due to GI activity on resting images, significant anterior wall ischemia in the mid to  distal segments, reduced ejection fraction of 47%  Prior ventricular arrhythmia when her stents were placed in the Cath Lab requiring shock.  echocardiogram improved, EF greater than 40%.  Cardiac catheter report from July 15 2009 indicate she had a vision bare metal stent 2.75 x 28 mm in the LCX, mini vision bare metal stent 2.5 x 28 and 2.5 x 15 mm stent to the LAD.  DJD in her neck and back  PMH:   has a past medical history of Abnormal Pap smear of cervix, Coronary artery disease (2011), DDD (degenerative disc disease), lumbar, Degenerative disc disease, lumbar, Hyperlipidemia, Hypertension, Macular degeneration, MI (myocardial infarction) ([place]), Parathyroid adenoma, and PVD (peripheral vascular disease) ([place]).  PSH:    Past Surgical History:  Procedure Laterality Date  . CARDIAC CATHETERIZATION  2013   [facility]  . CATARACT EXTRACTION    . COLPOSCOPY    . CORONARY ARTERY BYPASS GRAFT  02/07/2010   x 3  . FACIAL COSMETIC SURGERY  2013   Dr. Nadeen Landau  . FOOT SURGERY     right  . ILIO-FEMORAL BYPASS GRAFT  2011  . MITRAL VALVE REPAIR  01/2010  . ORIF ANKLE FRACTURE Right 10/08/2016   Procedure: OPEN REDUCTION INTERNAL FIXATION (ORIF) ANKLE FRACTURE;  Surgeon: Corky Mull, MD;  Location: [facility] ORS;  Service: Orthopedics;  Laterality: Right;  . PARATHYROIDECTOMY    . TUBAL LIGATION      Current Outpatient Medications  Medication Sig Dispense Refill  . ACIDOPHILUS LACTOBACILLUS PO  Take 1 tablet by mouth daily.    Marland Kitchen aspirin 81 MG EC tablet Take 81 mg by mouth daily.     . Cholecalciferol (VITAMIN D3) 1000 UNITS CAPS Take 1,000 Units by mouth daily.     . Coenzyme Q10 100 MG capsule Take 100 mg by mouth daily.     Marland Kitchen HYDROcodone-acetaminophen (NORCO) 7.5-325 MG tablet Take 1 tablet by mouth every 6 (six) hours as needed for moderate pain. 30 tablet 0  . IRON PO Take by mouth.    [name] Oil 350 MG CAPS Take 1 capsule by mouth daily.    . metoprolol tartrate (LOPRESSOR)  25 MG tablet Take 0.5 tablets (12.5 mg total) by mouth 2 (two) times daily. 90 tablet 2  . Multiple Vitamins-Minerals (PRESERVISION AREDS 2) CAPS Take 1 capsule by mouth 2 (two) times daily.    . pantoprazole (PROTONIX) 40 MG tablet TAKE ONE (1) TABLET BY MOUTH EVERY DAY 90 tablet 3  . Probiotic Product (RISA-BID PROBIOTIC PO) Take 250 mg by mouth 2 (two) times daily.     . rosuvastatin (CRESTOR) 40 MG tablet TAKE ONE (1) TABLET EACH DAY 90 tablet 3  . sertraline (ZOLOFT) 50 MG tablet TAKE TWO TABLETS BY MOUTH AT BEDTIME DAILY 90 tablet 1   No current facility-administered medications for this visit.      Allergies:   Patient has no known allergies.   Social History:  The patient  reports that she quit smoking about 7 years ago. Her smoking use included cigarettes. She has a 40.00 pack-year smoking history. she has never used smokeless tobacco. She reports that she drinks about 0.5 oz of alcohol per week. She reports that she does not use drugs.   Family History:   Family history is unknown by patient.    Review of Systems: Review of Systems  Constitutional: Negative.   Respiratory: Negative.   Cardiovascular: Negative.   Gastrointestinal: Negative.   Musculoskeletal: Positive for back pain.  Neurological: Negative.   Psychiatric/Behavioral: Negative.   All other systems reviewed and are negative.    PHYSICAL EXAM: VS:  BP 128/60 (BP Location: Left Arm, Patient Position: Sitting, Cuff Size: Normal)   Pulse 71   Ht 5' (1.524 m)   Wt 142 lb (64.4 kg)   BMI 27.73 kg/m  , BMI Body mass index is 27.73 kg/m. GEN: Well nourished, well developed, in no acute distress , obese HEENT: normal  Neck: no JVD, carotid bruits, or masses Cardiac: RRR; no murmurs, rubs, or gallops,no edema  Respiratory:  clear to auscultation bilaterally, normal work of breathing GI: soft, nontender, nondistended, + BS MS: no deformity or atrophy  Skin: warm and dry, no rash Neuro:  Strength and  sensation are intact Psych: euthymic mood, full affect    Recent Labs: 03/25/2017: ALT 18; BUN 22; Creatinine, Ser 0.79; Hemoglobin 13.0; Platelets 239; Potassium 4.2; Sodium 138; TSH 0.948    Lipid Panel Lab Results  Component Value Date   CHOL 145 03/25/2017   HDL 78 03/25/2017   LDLCALC 53 03/25/2017   TRIG 70 03/25/2017      Wt Readings from Last 3 Encounters:  05/18/17 142 lb (64.4 kg)  04/22/17 144 lb (65.3 kg)  03/23/17 143 lb 2 oz (64.9 kg)       ASSESSMENT AND PLAN:  Mixed hyperlipidemia - Plan: EKG 12-Lead Cholesterol is at goal on the current lipid regimen. No changes to the medications were made.  Stable  Coronary atherosclerosis of autologous  vein bypass graft without angina - Plan: EKG 12-Lead Currently with no symptoms of angina. No further workup at this time. Continue current medication regimen.  Stable  PVD (peripheral vascular disease) ([place]) - Plan: EKG 12-Lead Stable, no sx of claudication No further workup at this time  Essential hypertension, benign - Plan: EKG 12-Lead Blood pressure is well controlled on today's visit. No changes made to the medications. Stable  Class 1 obesity due to excess calories without serious comorbidity with body mass index (BMI) of 33.0 to 33.9 in adult We have encouraged continued exercise, careful diet management in an effort to lose weight.  Difficult time losing weight   Total encounter time more than 25 minutes  Greater than 50% was spent in counseling and coordination of care with the patient   Disposition:   F/U  12 months   No orders of the defined types were placed in this encounter.    Signed, Esmond Plants, M.D., Ph.D. 05/18/2017  [place], [place]

## 2017-05-18 ENCOUNTER — Encounter: Payer: Self-pay | Admitting: Cardiovascular Disease

## 2017-05-18 ENCOUNTER — Ambulatory Visit: Payer: Medicare HMO | Admitting: Cardiovascular Disease

## 2017-05-18 VITALS — BP 128/60 | HR 71 | Ht 60.0 in | Wt 142.0 lb

## 2017-05-18 DIAGNOSIS — Z87891 Personal history of nicotine dependence: Secondary | ICD-10-CM | POA: Diagnosis not present

## 2017-05-18 DIAGNOSIS — I739 Peripheral vascular disease, unspecified: Secondary | ICD-10-CM

## 2017-05-18 DIAGNOSIS — E782 Mixed hyperlipidemia: Secondary | ICD-10-CM | POA: Diagnosis not present

## 2017-05-18 DIAGNOSIS — I10 Essential (primary) hypertension: Secondary | ICD-10-CM | POA: Diagnosis not present

## 2017-05-18 DIAGNOSIS — I25118 Atherosclerotic heart disease of native coronary artery with other forms of angina pectoris: Secondary | ICD-10-CM

## 2017-05-18 NOTE — Patient Instructions (Addendum)

## 2017-06-08 DIAGNOSIS — H353131 Nonexudative age-related macular degeneration, bilateral, early dry stage: Secondary | ICD-10-CM | POA: Diagnosis not present

## 2017-06-08 DIAGNOSIS — H26493 Other secondary cataract, bilateral: Secondary | ICD-10-CM | POA: Diagnosis not present

## 2017-06-11 ENCOUNTER — Encounter: Payer: Self-pay | Admitting: Internal Medicine

## 2017-06-11 ENCOUNTER — Telehealth: Payer: Self-pay | Admitting: Internal Medicine

## 2017-06-11 DIAGNOSIS — K219 Gastro-esophageal reflux disease without esophagitis: Secondary | ICD-10-CM

## 2017-06-11 DIAGNOSIS — F411 Generalized anxiety disorder: Secondary | ICD-10-CM

## 2017-06-11 NOTE — Telephone Encounter (Signed)
Thank you for refilling my pantoprozal and the sertraline meds. Roby Lofts always approved a 12 month supply for both of these. I have transferred my meds to [facility] in [place] since [facility] closed. Next month could you please make sure I get a 90 day supply on both of these so I don't have to call every month for a refill plus it saves me a lot of money. Thanks in advance. If there is a problem with this feel free to contact me.

## 2017-06-14 MED ORDER — PANTOPRAZOLE SODIUM 40 MG PO TBEC: DELAYED_RELEASE_TABLET | ORAL | 4 refills | Status: DC

## 2017-06-14 MED ORDER — SERTRALINE HCL 50 MG PO TABS: ORAL_TABLET | ORAL | 4 refills | Status: DC

## 2017-06-14 NOTE — Telephone Encounter (Signed)
See note below

## 2017-06-14 NOTE — Addendum Note (Signed)
Addended by: Burnard Hawthorne on: 06/14/2017 12:26 PM   Modules accepted: Orders

## 2017-06-14 NOTE — Telephone Encounter (Signed)
Call pt  Let her know I refilled meds to [facility] for one year supply Also, since she is former smoker and has acid reflux, please advise that she has an EGD done at some point. GERD and smoking can be a/w Barrett's esophagus which is a pre cancerous condition.   Also, please go through info on long term use of protonix below. You can offer to  Mail to her if helpful   Long term use beyond 3 months of proton pump inhibitors , also called PPI's, is associated with malabsorption of vitamins, chronic kidney disease, fracture risk, and diarrheal illnesses. PPI's include Nexium, Prilosec, Protonix, Dexilant, and Prevacid.   I generally recommend trying to control acid reflux with lifestyle modifications including avoiding trigger foods, not eating 2 hours prior to bedtime. You may use histamine 2 blockers daily to twice daily ( this is Zantac, Pepcid) and then when symptoms flare, start back on PPI for short course.

## 2017-06-16 NOTE — Telephone Encounter (Signed)
Left message to return call, will mail letter with information along with information stated below

## 2017-06-17 ENCOUNTER — Encounter (INDEPENDENT_AMBULATORY_CARE_PROVIDER_SITE_OTHER): Payer: Medicare HMO | Admitting: Ophthalmology

## 2017-06-17 DIAGNOSIS — H353221 Exudative age-related macular degeneration, left eye, with active choroidal neovascularization: Secondary | ICD-10-CM

## 2017-06-17 DIAGNOSIS — H35033 Hypertensive retinopathy, bilateral: Secondary | ICD-10-CM | POA: Diagnosis not present

## 2017-06-17 DIAGNOSIS — H353112 Nonexudative age-related macular degeneration, right eye, intermediate dry stage: Secondary | ICD-10-CM

## 2017-06-17 DIAGNOSIS — I10 Essential (primary) hypertension: Secondary | ICD-10-CM

## 2017-06-17 DIAGNOSIS — H43813 Vitreous degeneration, bilateral: Secondary | ICD-10-CM | POA: Diagnosis not present

## 2017-06-22 ENCOUNTER — Telehealth: Payer: Self-pay | Admitting: Family

## 2017-06-22 NOTE — Telephone Encounter (Signed)
Please mail letter-   Hope you are well.   In reviewing your chart, it appears your are due for annual mammogram.  Please let us know if you would like for me to order. You may call the office to let us know, and we will order for you.   Once the order in the system, you may schedule at your preferred location.   Typically women have been using one of the sites below however you may go where you have been in the past. I would ensure that when you do get a mammogram that it is 3D ( as opposed to 2D which was prior technology). Evidence suggests that 3D is superior.   Please note that NOT all insurance companies cover 3D, and you may have to pay a higher copay. You may call your insurance company to further clarify your benefits.   Options for Mammogram in [place]:    [facility]  [address]  [place], [place]  [phone]   [facility] [address] , [place] [phone]   Let us know if you have questions.   My best,   Crystalina Stodghill, NP   

## 2017-06-22 NOTE — Telephone Encounter (Signed)
Mychart message sent.

## 2017-06-24 NOTE — Telephone Encounter (Signed)
Patient has already had mammogram on 04/01/2017 at [facility] under encounters

## 2017-06-25 NOTE — Telephone Encounter (Signed)
noted 

## 2017-06-29 ENCOUNTER — Encounter: Payer: Self-pay | Admitting: Family Medicine

## 2017-06-29 ENCOUNTER — Ambulatory Visit: Payer: Medicare HMO | Admitting: Family Medicine

## 2017-06-29 ENCOUNTER — Other Ambulatory Visit: Payer: Self-pay

## 2017-06-29 VITALS — BP 110/60 | HR 64 | Temp 97.6°F | Wt 142.0 lb

## 2017-06-29 DIAGNOSIS — J069 Acute upper respiratory infection, unspecified: Secondary | ICD-10-CM

## 2017-06-29 DIAGNOSIS — R05 Cough: Secondary | ICD-10-CM | POA: Diagnosis not present

## 2017-06-29 DIAGNOSIS — R059 Cough, unspecified: Secondary | ICD-10-CM

## 2017-06-29 LAB — POC INFLUENZA A&B (BINAX/QUICKVUE)
INFLUENZA B, POC: NEGATIVE
Influenza A, POC: NEGATIVE

## 2017-06-29 MED ORDER — BENZONATATE 200 MG PO CAPS: 200.0000 mg | ORAL_CAPSULE | Freq: Two times a day (BID) | ORAL | 0 refills | Status: DC | PRN

## 2017-06-29 NOTE — Patient Instructions (Signed)
Nice to see you. You likely have a viral illness.  We will treat with Tessalon for cough. You can try to go back to work on Thursday if you are feeling better. If you worsen, develop recurrent fevers, have trouble breathing or cough productive of blood please seek medical attention again.

## 2017-06-29 NOTE — Progress Notes (Signed)
  Tommi Rumps, MD Phone: [phone]  [name] is a 72 y.o. female who presents today for same-day visit.  Patient notes onset of symptoms 5 days ago.  Started with sore throat and fatigue and chills and body aches.  She did not check her temperature though she felt warm.  Has had a dry cough.  No respiratory congestion or chest congestion.  Notes her back did start to hurt a little bit with coughing though otherwise no pain. Has been breathing well.   She feels like she is been getting better over the last day or so.  She has been sleeping 12-14 hours a day.  Cough is keeping her up at night at times.  She has not tried any medications for this though does note her daily hydrocodone has helped some with her cough.  She has no known sick contacts.   Social History   Tobacco Use  Smoking Status Former Smoker  . Packs/day: 1.00  . Years: 40.00  . Pack years: 40.00  . Types: Cigarettes  . Last attempt to quit: 07/15/2009  . Years since quitting: 7.9  Smokeless Tobacco Never Used  Tobacco Comment   quit 06/2009 smoked from 20s to 2011 1.5 ppd no FH lung cancer      ROS see history of present illness  Objective  Physical Exam Vitals:   06/29/17 0956  BP: 110/60  Pulse: 64  Temp: 97.6 F (36.4 C)  SpO2: 96%    BP Readings from Last 3 Encounters:  06/29/17 110/60  05/18/17 128/60  04/22/17 134/86   Wt Readings from Last 3 Encounters:  06/29/17 142 lb (64.4 kg)  05/18/17 142 lb (64.4 kg)  04/22/17 144 lb (65.3 kg)    Physical Exam  Constitutional: No distress.  HENT:  Head: Normocephalic and atraumatic.  Mouth/Throat: No oropharyngeal exudate.  Normal TMs bilaterally, posterior oropharyngeal erythema noted  Eyes: Conjunctivae are normal. Pupils are equal, round, and reactive to light.  Neck: Neck supple.  Slight upper anterior cervical chain palpable lymphadenopathy that is nontender with some submandibular lymphadenopathy as well  Cardiovascular: Normal rate,  regular rhythm and normal heart sounds.  Pulmonary/Chest: Effort normal and breath sounds normal.  Musculoskeletal: She exhibits no edema.  Neurological: She is alert. Gait normal.  Skin: Skin is warm and dry. She is not diaphoretic.     Assessment/Plan: Please see individual problem list.  URI (upper respiratory infection) Most consistent with viral illness and could be a flulike illness versus false negative flu test.  Given that she has 6 days and Tamiflu would not be beneficial.  Discussed supportive care.  Tessalon for cough.  She is already on hydrocodone.  She will monitor for continuing improvement.  She will follow-up in 3-4 weeks with myself or her PCP to recheck the lymphadenopathy that is likely related to her current illness.  She is given return precautions.   Orders Placed This Encounter  Procedures  . POC Influenza A&B (Binax test)    Meds ordered this encounter  Medications  . benzonatate (TESSALON) 200 MG capsule    Sig: Take 1 capsule (200 mg total) by mouth 2 (two) times daily as needed for cough.    Dispense:  20 capsule    Refill:  0     Tommi Rumps, MD [place]

## 2017-06-29 NOTE — Assessment & Plan Note (Signed)
Most consistent with viral illness and could be a flulike illness versus false negative flu test.  Given that she has 6 days and Tamiflu would not be beneficial.  Discussed supportive care.  Tessalon for cough.  She is already on hydrocodone.  She will monitor for continuing improvement.  She will follow-up in 3-4 weeks with myself or her PCP to recheck the lymphadenopathy that is likely related to her current illness.  She is given return precautions.

## 2017-07-11 ENCOUNTER — Encounter: Payer: Self-pay | Admitting: Family

## 2017-07-11 DIAGNOSIS — F411 Generalized anxiety disorder: Secondary | ICD-10-CM

## 2017-07-11 DIAGNOSIS — K219 Gastro-esophageal reflux disease without esophagitis: Secondary | ICD-10-CM

## 2017-07-12 MED ORDER — PANTOPRAZOLE SODIUM 40 MG PO TBEC: DELAYED_RELEASE_TABLET | ORAL | 4 refills | Status: DC

## 2017-07-12 MED ORDER — SERTRALINE HCL 50 MG PO TABS: ORAL_TABLET | ORAL | 1 refills | Status: DC

## 2017-07-27 ENCOUNTER — Ambulatory Visit: Payer: Medicare HMO | Admitting: Obstetrics and Gynecology

## 2017-07-27 ENCOUNTER — Encounter: Payer: Self-pay | Admitting: Obstetrics and Gynecology

## 2017-07-27 VITALS — BP 118/60 | Ht 60.0 in | Wt 144.0 lb

## 2017-07-27 DIAGNOSIS — R87612 Low grade squamous intraepithelial lesion on cytologic smear of cervix (LGSIL): Secondary | ICD-10-CM | POA: Diagnosis not present

## 2017-07-27 DIAGNOSIS — R69 Illness, unspecified: Secondary | ICD-10-CM | POA: Diagnosis not present

## 2017-07-27 DIAGNOSIS — N870 Mild cervical dysplasia: Secondary | ICD-10-CM | POA: Diagnosis not present

## 2017-07-27 DIAGNOSIS — N72 Inflammatory disease of cervix uteri: Secondary | ICD-10-CM | POA: Diagnosis not present

## 2017-07-27 NOTE — Progress Notes (Signed)
HPI:  [name] is a 72 y.o.  G1P1000  who presents today for evaluation and management of abnormal cervical cytology.    Dysplasia History:   03/2016 ASCUS-HPV+ 03/2016 Colposcopy - CIN1 04/2017 - LGSIL, HPV +  OB History  Gravida Para Term Preterm AB Living  1 1 1         SAB TAB Ectopic Multiple Live Births               # Outcome Date GA Lbr Len/2nd Weight Sex Delivery Anes PTL Lv  1 Term             Past Medical History:  Diagnosis Date  . Abnormal Pap smear of cervix    Dr. Prentice Docker   . Coronary artery disease 2011   CABG x 3  . DDD (degenerative disc disease), lumbar   . Degenerative disc disease, lumbar    L4/5 noted CT ab/pelvis 05/19/11   . Hyperlipidemia   . Hypertension    under control  . Macular degeneration   . MI (myocardial infarction) ([place])    stents x 3 06/2009 [facility], s/p CABG x 3   . Parathyroid adenoma    right 13 x 8 mm s/p resection   . PVD (peripheral vascular disease) ([place])     Past Surgical History:  Procedure Laterality Date  . CARDIAC CATHETERIZATION  2013   [facility]  . CATARACT EXTRACTION    . COLPOSCOPY    . CORONARY ARTERY BYPASS GRAFT  02/07/2010   x 3  . FACIAL COSMETIC SURGERY  2013   Dr. Nadeen Landau  . FOOT SURGERY     right  . ILIO-FEMORAL BYPASS GRAFT  2011  . MITRAL VALVE REPAIR  01/2010  . ORIF ANKLE FRACTURE Right 10/08/2016   Procedure: OPEN REDUCTION INTERNAL FIXATION (ORIF) ANKLE FRACTURE;  Surgeon: Corky Mull, MD;  Location: [facility] ORS;  Service: Orthopedics;  Laterality: Right;  . PARATHYROIDECTOMY    . TUBAL LIGATION      SOCIAL HISTORY:  Social History   Substance and Sexual Activity  Alcohol Use Yes  . Alcohol/week: 0.5 oz  . Types: 1 Standard drinks or equivalent per week   Comment: occ.    Social History   Substance and Sexual Activity  Drug Use No     Family History  Family history unknown: Yes    ALLERGIES:  Patient has no known allergies.  Current Outpatient Medications on  File Prior to Visit  Medication Sig Dispense Refill  . aspirin 81 MG EC tablet Take 81 mg by mouth daily.     . Cholecalciferol (VITAMIN D3) 1000 UNITS CAPS Take 1,000 Units by mouth daily.     . Coenzyme Q10 100 MG capsule Take 100 mg by mouth daily.     Marland Kitchen HYDROcodone-acetaminophen (NORCO) 7.5-325 MG tablet Take 1 tablet by mouth every 6 (six) hours as needed for moderate pain. 30 tablet 0  . IRON PO Take 325 mg by mouth daily.     . metoprolol tartrate (LOPRESSOR) 25 MG tablet Take 0.5 tablets (12.5 mg total) by mouth 2 (two) times daily. 90 tablet 2  . Multiple Vitamins-Minerals (PRESERVISION AREDS 2) CAPS Take 1 capsule by mouth 2 (two) times daily.    . pantoprazole (PROTONIX) 40 MG tablet TAKE ONE (1) TABLET BY MOUTH EVERY DAY 90 tablet 4  . Probiotic Product (RISA-BID PROBIOTIC PO) Take 250 mg by mouth 2 (two) times daily.     . rosuvastatin (  CRESTOR) 40 MG tablet TAKE ONE (1) TABLET EACH DAY 90 tablet 3  . sertraline (ZOLOFT) 50 MG tablet TAKE TWO TABLETS BY MOUTH AT BEDTIME DAILY 180 tablet 1  . ACIDOPHILUS LACTOBACILLUS PO Take 1 tablet by mouth daily.    . benzonatate (TESSALON) 200 MG capsule Take 1 capsule (200 mg total) by mouth 2 (two) times daily as needed for cough. (Patient not taking: Reported on 07/27/2017) 20 capsule 0  . Krill Oil 350 MG CAPS Take 1 capsule by mouth daily.     No current facility-administered medications on file prior to visit.     Physical Exam: -Vitals:  BP 118/60   Ht 5' (1.524 m)   Wt 144 lb (65.3 kg)   BMI 28.12 kg/m  GEN: WD, WN, NAD.  A+ O x 3, good mood and affect. ABD:  NT, ND.  Soft, no masses.  No hernias noted.   Pelvic:   Vulva: Normal appearance.  No lesions.  Vagina: No lesions or abnormalities noted.  Support: Normal pelvic support.  Urethra No masses tenderness or scarring.  Meatus Normal size without lesions or prolapse.  Cervix: See below.  Anus: Normal exam.  No lesions.  Perineum: Normal exam.  No lesions.        Bimanual    Uterus: Normal size.  Non-tender.  Mobile.  AV.  Adnexae: No masses.  Non-tender to palpation.  Cul-de-sac: Negative for abnormality.   PROCEDURE: 1.  Urine Pregnancy Test:  Not done 2.  Colposcopy performed with 4% acetic acid after verbal consent obtained                                         -Aceto-white Lesions Location(s): diffusely              -Biopsy performed at 5, 8, and 11 o'clock               -ECC indicated and performed: Yes.       -Biopsy sites made hemostatic with pressure and/or Monsel's solution   -Satisfactory colposcopy: No.    -Evidence of Invasive cervical CA :  NO  ASSESSMENT:  [name] is a 72 y.o. G1P1000 here for  1. Low grade squamous intraepithelial lesion on cytologic smear of cervix (LGSIL)   .  PLAN:  I discussed the grading system of pap smears and HPV high risk viral types.  We will discuss and base management after colpo results return.     Prentice Docker, MD  [facility], [facility] 07/27/2017  3:36 PM

## 2017-07-30 ENCOUNTER — Telehealth: Payer: Self-pay

## 2017-07-30 LAB — PATHOLOGY

## 2017-07-30 NOTE — Telephone Encounter (Signed)
Pt had biopsy w/SDJ Tues 07/27/17. Pt started having chills & fever yesterday. Pt inquiring if related. Cb#[phone]

## 2017-07-30 NOTE — Telephone Encounter (Signed)
Spoke w/pt. She started feeling tired when she left work & had low grade fever/chills when she got home. Slept for 15 hours. Feels better today. No pain or bleeding. Pt did have flu a few weeks ago. Advised to monitor & call back w/any further questions/concerns.

## 2017-08-02 ENCOUNTER — Telehealth: Payer: Self-pay | Admitting: Obstetrics and Gynecology

## 2017-08-02 ENCOUNTER — Telehealth: Payer: Self-pay | Admitting: Cardiovascular Disease

## 2017-08-02 NOTE — Telephone Encounter (Signed)
Pt calling stating she's been having back pains and very tired. She states she may need to go ER but would like to rule out heart care before  States she has had no other symptoms   Please advise

## 2017-08-02 NOTE — Telephone Encounter (Signed)
Left generic VM 

## 2017-08-02 NOTE — Telephone Encounter (Signed)
Called patient. She's been having back pains in her upper back between her shoulder blades starting last night. Its been on and off. Denies jaw or left arm pain or dizziness or shortness of breath. Complains that it is difficult to get a breath when she has the pain. She feels at times the pain may be radiating to the front. She is not having any pain right now. Pt verbalized understanding to call 911 or go to the emergency room, if he develops any new or worsening symptoms. She would like to come in for an EKG to make sure nothing is wrong with her heart. Scheduled to see Ignacia Bayley, NP tomorrow 08/03/17 at 3:30 pm.

## 2017-08-03 ENCOUNTER — Ambulatory Visit: Payer: Medicare HMO | Admitting: Nurse Practitioner

## 2017-08-03 ENCOUNTER — Emergency Department
Admission: EM | Admit: 2017-08-03 | Discharge: 2017-08-04 | Disposition: A | Discharge status: Home / Self Care | Payer: Medicare HMO | Attending: Emergency Medicine | Admitting: Emergency Medicine

## 2017-08-03 ENCOUNTER — Other Ambulatory Visit: Payer: Self-pay

## 2017-08-03 ENCOUNTER — Telehealth: Payer: Self-pay

## 2017-08-03 ENCOUNTER — Encounter: Payer: Self-pay | Admitting: Nurse Practitioner

## 2017-08-03 ENCOUNTER — Ambulatory Visit: Payer: Medicare HMO | Admitting: Family Medicine

## 2017-08-03 ENCOUNTER — Emergency Department: Payer: Medicare HMO

## 2017-08-03 ENCOUNTER — Other Ambulatory Visit
Admission: RE | Admit: 2017-08-03 | Discharge: 2017-08-03 | Disposition: A | Discharge status: Home / Self Care | Payer: Medicare HMO | Source: Ambulatory Visit | Attending: Nurse Practitioner | Admitting: Nurse Practitioner

## 2017-08-03 VITALS — BP 110/60 | HR 104 | Temp 97.8°F | Ht 60.0 in | Wt 138.2 lb

## 2017-08-03 DIAGNOSIS — I25119 Atherosclerotic heart disease of native coronary artery with unspecified angina pectoris: Secondary | ICD-10-CM | POA: Diagnosis not present

## 2017-08-03 DIAGNOSIS — R079 Chest pain, unspecified: Secondary | ICD-10-CM

## 2017-08-03 DIAGNOSIS — I10 Essential (primary) hypertension: Secondary | ICD-10-CM

## 2017-08-03 DIAGNOSIS — E782 Mixed hyperlipidemia: Secondary | ICD-10-CM | POA: Diagnosis not present

## 2017-08-03 DIAGNOSIS — Z79899 Other long term (current) drug therapy: Secondary | ICD-10-CM | POA: Diagnosis not present

## 2017-08-03 DIAGNOSIS — R000 Tachycardia, unspecified: Secondary | ICD-10-CM

## 2017-08-03 DIAGNOSIS — I2510 Atherosclerotic heart disease of native coronary artery without angina pectoris: Secondary | ICD-10-CM | POA: Diagnosis not present

## 2017-08-03 DIAGNOSIS — Z87891 Personal history of nicotine dependence: Secondary | ICD-10-CM | POA: Diagnosis not present

## 2017-08-03 DIAGNOSIS — Z7982 Long term (current) use of aspirin: Secondary | ICD-10-CM | POA: Insufficient documentation

## 2017-08-03 DIAGNOSIS — K819 Cholecystitis, unspecified: Secondary | ICD-10-CM | POA: Insufficient documentation

## 2017-08-03 DIAGNOSIS — K8020 Calculus of gallbladder without cholecystitis without obstruction: Secondary | ICD-10-CM | POA: Diagnosis not present

## 2017-08-03 DIAGNOSIS — Z951 Presence of aortocoronary bypass graft: Secondary | ICD-10-CM | POA: Insufficient documentation

## 2017-08-03 DIAGNOSIS — R9389 Abnormal findings on diagnostic imaging of other specified body structures: Secondary | ICD-10-CM | POA: Diagnosis not present

## 2017-08-03 DIAGNOSIS — M549 Dorsalgia, unspecified: Secondary | ICD-10-CM

## 2017-08-03 DIAGNOSIS — R935 Abnormal findings on diagnostic imaging of other abdominal regions, including retroperitoneum: Secondary | ICD-10-CM | POA: Diagnosis not present

## 2017-08-03 LAB — BASIC METABOLIC PANEL
Anion gap: 11 (ref 5–15)
BUN: 10 mg/dL (ref 6–20)
CALCIUM: 10.1 mg/dL (ref 8.9–10.3)
CO2: 22 mmol/L (ref 22–32)
CREATININE: 0.68 mg/dL (ref 0.44–1.00)
Chloride: 95 mmol/L — ABNORMAL LOW (ref 101–111)
GFR calc non Af Amer: >60 mL/min (ref >60)
Glucose, Bld: 107 mg/dL — ABNORMAL HIGH (ref 65–99)
Potassium: 4.2 mmol/L (ref 3.5–5.1)
SODIUM: 128 mmol/L — AB (ref 135–145)

## 2017-08-03 LAB — URINALYSIS, COMPLETE (UACMP) WITH MICROSCOPIC
Bilirubin Urine: NEGATIVE
GLUCOSE, UA: NEGATIVE mg/dL
Hgb urine dipstick: NEGATIVE
KETONES UR: 20 mg/dL — AB
Nitrite: NEGATIVE
PH: 5 (ref 5.0–8.0)
Protein, ur: 30 mg/dL — AB
Specific Gravity, Urine: >1.046 — ABNORMAL HIGH (ref 1.005–1.030)

## 2017-08-03 LAB — CBC WITH DIFFERENTIAL/PLATELET
BASOS PCT: 0 %
Basophils Absolute: 0.1 10*3/uL (ref 0–0.1)
EOS ABS: 0 10*3/uL (ref 0–0.7)
Eosinophils Relative: 0 %
HCT: 39.7 % (ref 35.0–47.0)
HEMOGLOBIN: 13.6 g/dL (ref 12.0–16.0)
Lymphocytes Relative: 9 %
Lymphs Abs: 1.3 10*3/uL (ref 1.0–3.6)
MCH: 31.5 pg (ref 26.0–34.0)
MCHC: 34.4 g/dL (ref 32.0–36.0)
MCV: 91.5 fL (ref 80.0–100.0)
Monocytes Absolute: 2 10*3/uL — ABNORMAL HIGH (ref 0.2–0.9)
Monocytes Relative: 14 %
NEUTROS PCT: 77 %
Neutro Abs: 11.3 10*3/uL — ABNORMAL HIGH (ref 1.4–6.5)
Platelets: 229 10*3/uL (ref 150–440)
RBC: 4.34 MIL/uL (ref 3.80–5.20)
RDW: 14.5 % (ref 11.5–14.5)
WBC: 14.7 10*3/uL — AB (ref 3.6–11.0)

## 2017-08-03 LAB — HEPATIC FUNCTION PANEL
ALBUMIN: 3.9 g/dL (ref 3.5–5.0)
ALK PHOS: 135 U/L — AB (ref 38–126)
ALT: 92 U/L — AB (ref 14–54)
AST: 38 U/L (ref 15–41)
Bilirubin, Direct: 0.2 mg/dL (ref 0.1–0.5)
Indirect Bilirubin: 1.1 mg/dL — ABNORMAL HIGH (ref 0.3–0.9)
Total Bilirubin: 1.3 mg/dL — ABNORMAL HIGH (ref 0.3–1.2)
Total Protein: 7.7 g/dL (ref 6.5–8.1)

## 2017-08-03 LAB — LIPASE, BLOOD: LIPASE: 26 U/L (ref 11–51)

## 2017-08-03 LAB — TROPONIN I: Troponin I: <0.03 ng/mL (ref <0.03)

## 2017-08-03 LAB — FIBRIN DERIVATIVES D-DIMER (ARMC ONLY): FIBRIN DERIVATIVES D-DIMER (ARMC): 1575.55 ng{FEU}/mL — AB (ref 0.00–499.00)

## 2017-08-03 IMAGING — CT CT ANGIO CHEST
2 of 7 series · 19 of 46 positions shown · IV contrast (APPLIED)
Comparison: Chest x-ray [DATE]

CLINICAL DATA: Back pain

EXAM:
CT ANGIOGRAPHY CHEST WITH CONTRAST
TECHNIQUE: Multidetector CT imaging of the chest was performed using the
standard protocol during bolus administration of intravenous
contrast. Multiplanar CT image reconstructions and MIPs were
obtained to evaluate the vascular anatomy.
CONTRAST:  75mL OMNIPAQUE IOHEXOL 350 MG/ML SOLN

[Series 5: thins · axial · 0.58mm/px · z∈[+540,+778]mm · 17 of 266 slices shown]
[im 14/266  lung]
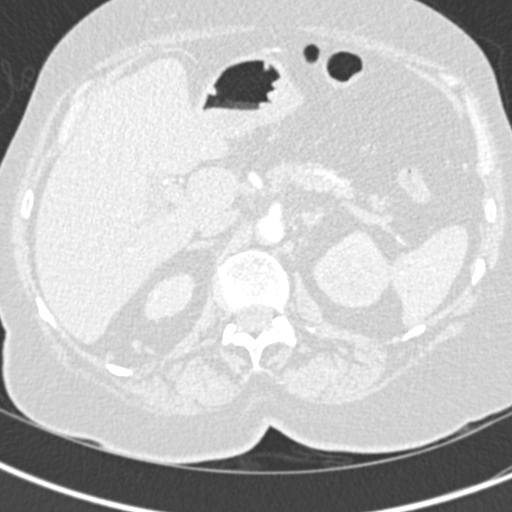
[im 28/266  soft-tissue]
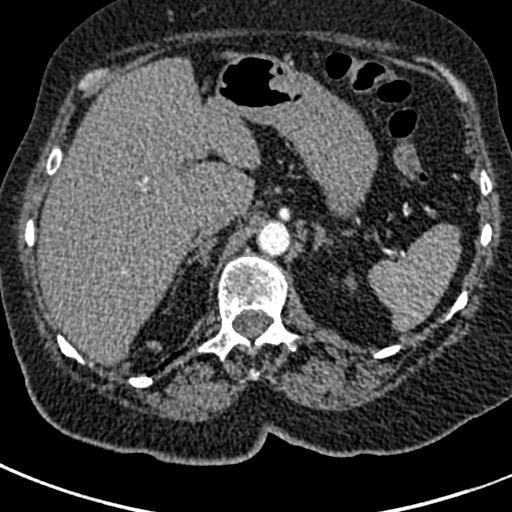
[im 42/266  lung]
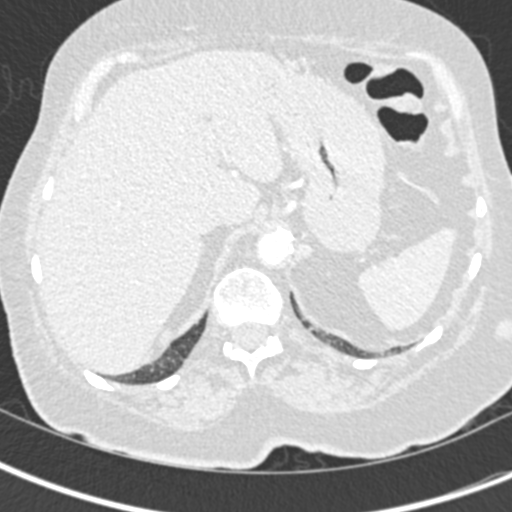
[im 56/266  soft-tissue]
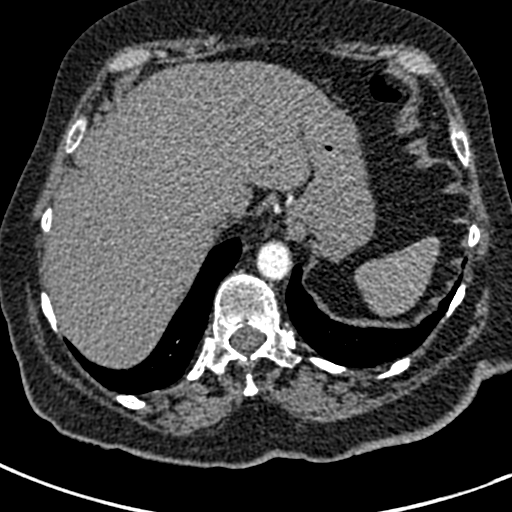
[im 70/266  lung]
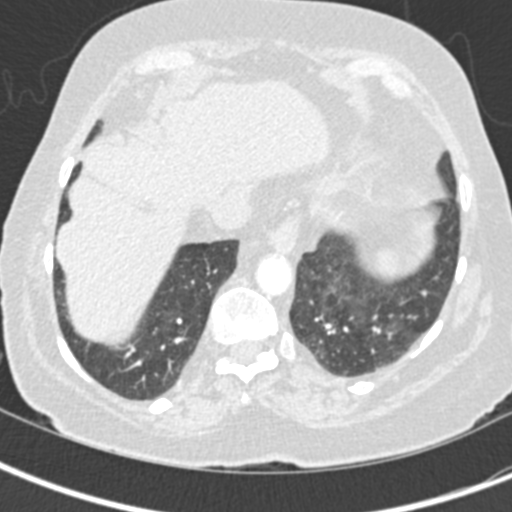
[im 84/266  soft-tissue]
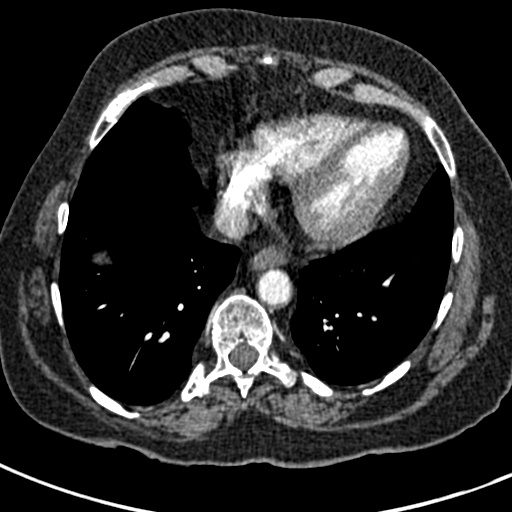
[im 98/266  lung]
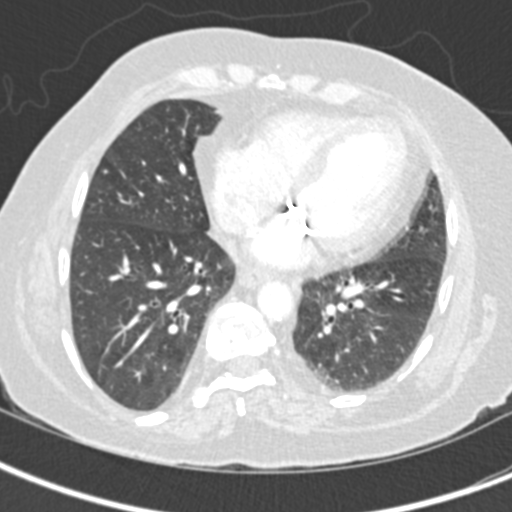
[im 112/266  soft-tissue]
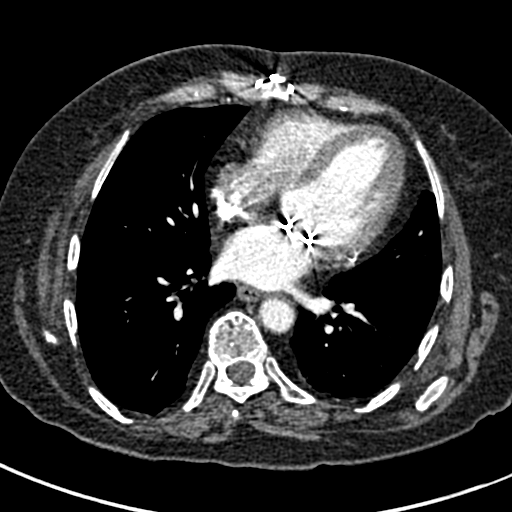
[im 140/266  lung]
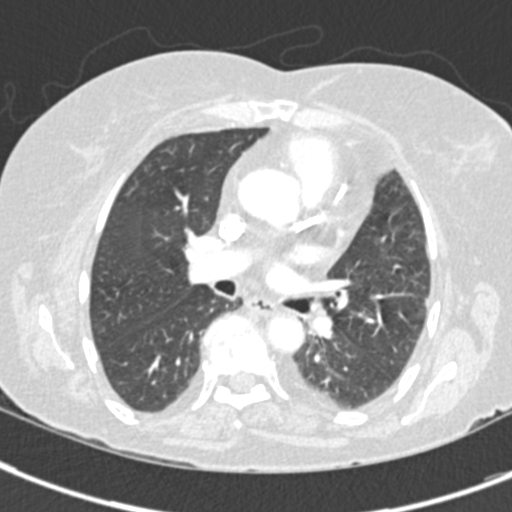
[im 154/266  soft-tissue]
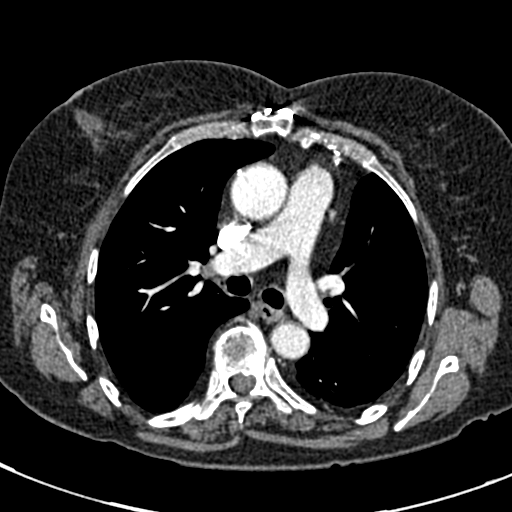
[im 168/266  lung]
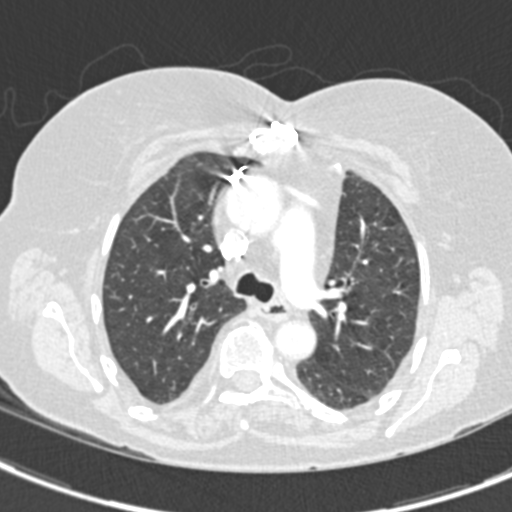
[im 182/266  soft-tissue]
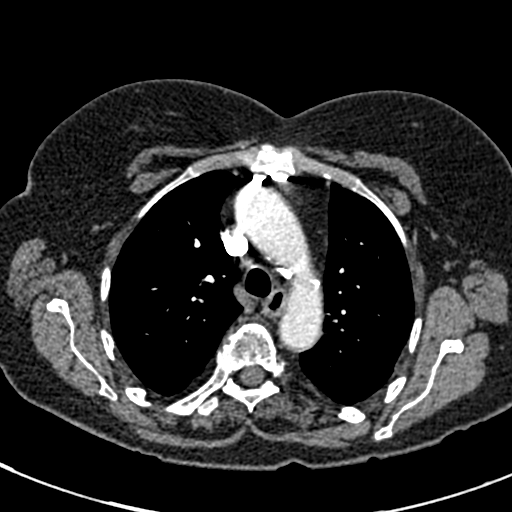
[im 196/266  lung]
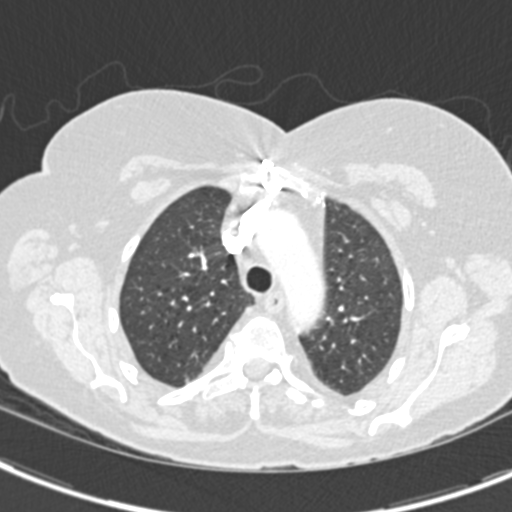
[im 210/266  soft-tissue]
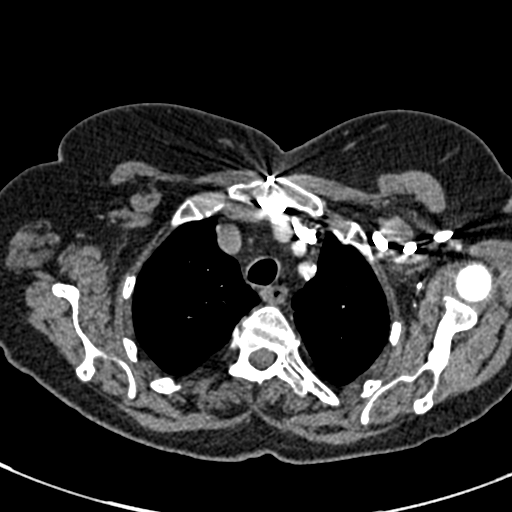
[im 224/266  lung]
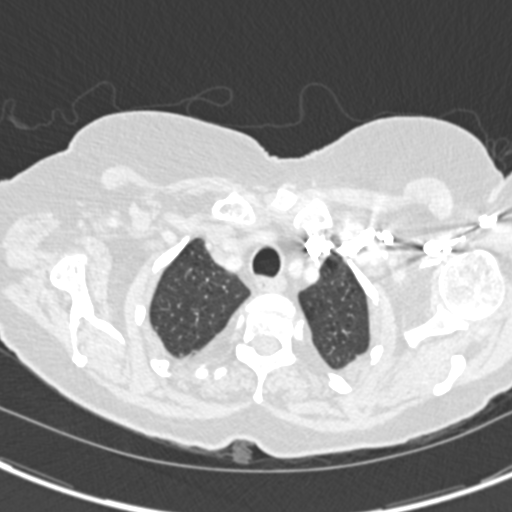
[im 238/266  soft-tissue]
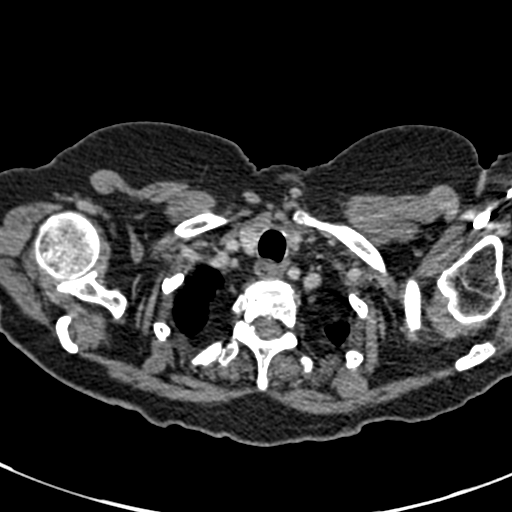
[im 252/266  lung]
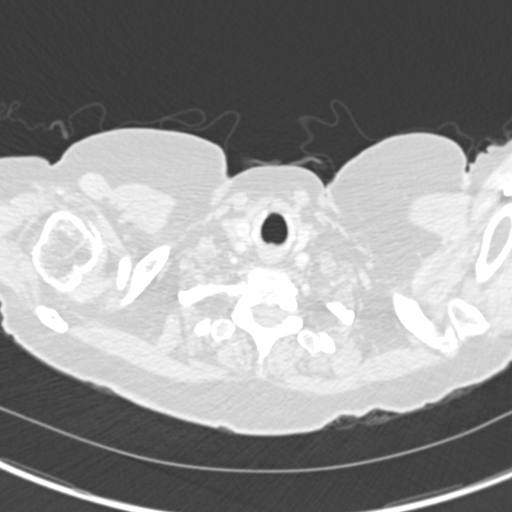

[Series 7: coronal mpr · coronal · 0.52mm/px · 2 of 75 slices shown]
[im 25/75  soft-tissue]
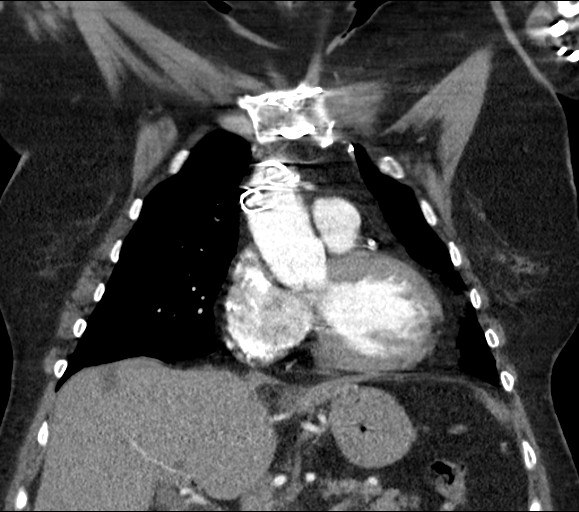
[im 50/75  soft-tissue]
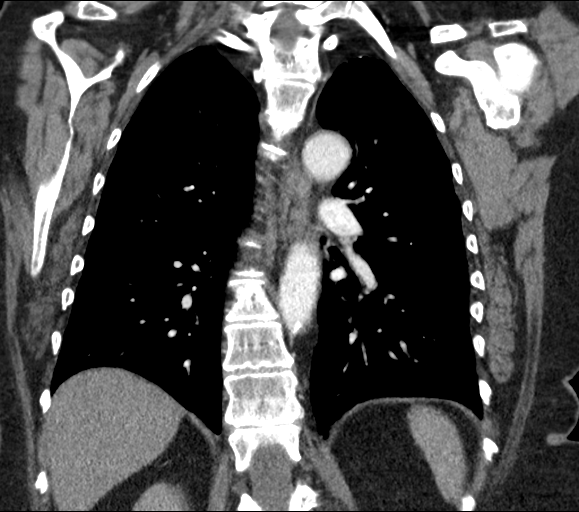

[19 of 46 positions shown; findings below may reference images not displayed]

FINDINGS: Cardiovascular: Suboptimal opacification of the pulmonary arterial
system limits evaluation for pulmonary emboli. No acute central
embolus is seen. Post CABG changes. No aneurysm. Aortic
atherosclerosis. Coronary vascular calcification. Borderline
cardiomegaly. No pericardial effusion. Mitral valve prosthesis.

Mediastinum/Nodes: Midline trachea. No thyroid mass. No
significantly enlarged lymph nodes. Esophagus within normal limits.

Lungs/Pleura: Mild apical emphysema. No acute consolidation, pleural
effusion or pneumothorax

Upper Abdomen: Possible inflammatory changes of the gallbladder
fundus, incompletely visualized.

Musculoskeletal: Post sternotomy.  Degenerative changes.

Review of the MIP images confirms the above findings.
IMPRESSION: 1. Suboptimal opacification of the pulmonary arterial system limits
the exam. No acute central embolus is seen.
2. Possible partially visualized inflammatory changes at the
gallbladder fundus, recommend correlation with right upper quadrant
abdominal ultrasound
3. Minimal apical emphysema.  No acute pulmonary infiltrate.

Aortic Atherosclerosis ([MT]-[MT]) and Emphysema ([MT]-[MT]).

## 2017-08-03 MED ORDER — SODIUM CHLORIDE 0.9 % IV BOLUS
250.0000 mL | Freq: Once | INTRAVENOUS | Status: AC
  Administered 2017-08-03: 250 mL via INTRAVENOUS

## 2017-08-03 MED ORDER — SODIUM CHLORIDE 0.9 % IV SOLN
1.0000 g | Freq: Once | INTRAVENOUS | Status: AC
  Administered 2017-08-04: 1 g via INTRAVENOUS
  Filled 2017-08-03: qty 10

## 2017-08-03 MED ORDER — IOHEXOL 350 MG/ML SOLN
75.0000 mL | Freq: Once | INTRAVENOUS | Status: AC | PRN
  Administered 2017-08-03: 75 mL via INTRAVENOUS

## 2017-08-03 NOTE — ED Notes (Signed)
Patient sent from her cardiologist for possible PE.  Patient is having back pain that is worse with deep breathing.  Patient had an elevated d-dimer.  Patient is in no obvious distress at this time.

## 2017-08-03 NOTE — ED Provider Notes (Addendum)
[facility] Emergency Department Provider Note  ____________________________________________   I have reviewed the triage vital signs and the nursing notes. Where available I have reviewed prior notes and, if possible and indicated, outside hospital notes.    HISTORY  Chief Complaint Back Pain    HPI [name] is a 72 y.o. female sense of incomplete a medical history including ED with stents, femoropopliteal bypass, presents today complaining of having had back pain yesterday.  She states that she noticed a gradual onset upper back pain while she was sitting over the computer yesterday typing.  She states that it did not actually have pleuritic component to it but sometimes it seems like it was hard to catch a deep breath.  It lasted for several hours and then she went to bed and when she woke up it was gone.  No trauma no numbness no weakness, no chest pain did not feel like her prior ACS.  She does not feel short of breath she has not had difficulty walking around she has no leg swelling recent travel surgery or exogenous estrogens.  Patient has no personal or family history of PE or DVT.  She has no symptoms today.  She did go to outside physician however he checked a d-dimer and the d-dimer was positive she was sent here for evaluation rule out PE.  The pain itself began gradually was not sudden onset it was not tearing, it was not maximal intensity at initial sensation, there was no associated chest pain.  She states it was in her upper and outer lower back she had no dysuria no urinary frequency, she has no discomfort now.  She states it was worse when she changed position or moves the wrong way and she wonders if perhaps she was just hunched over the computer wrong.  Pain lasted for several hours, it was an aching or cramping discomfort, no radiation, no associated symptoms, no prior treatment     Past Medical History:  Diagnosis Date  . Abnormal Pap smear of  cervix    Dr. Prentice Docker   . Coronary artery disease    a. 06/2009 MI/PCI to LCX and LAD; b. 01/2011 Occluded stents-->CABG x 3 ([facility]): LIMA->LAD, VG->LCX, VG->RPDA; c. 01/2012 Cath: LM 50, LAD 95ost, 184m, LCX 95ost, 134m, RCA 35m, RPDA small, LIMA->LAD ok, VG->LCX ok, VG->RPDA 100-->Med rx.  . Degenerative disc disease, lumbar    L4/5 noted CT ab/pelvis 05/19/11   . Hyperlipidemia   . Hypertension    under control  . Macular degeneration   . Parathyroid adenoma    right 13 x 8 mm s/p resection   . PVD (peripheral vascular disease) ([place])    a. 01/2010 s/p Aortofemoral bypass.    Patient Active Problem List   Diagnosis Date Noted  . URI (upper respiratory infection) 06/29/2017  . Abnormal Pap smear of cervix 04/22/2017  . Cervical high risk HPV (human papillomavirus) test positive 04/22/2017  . DDD (degenerative disc disease), lumbar   . Closed fracture of ankle, trimalleolar 10/08/2016  . Abnormal Pap smear of vagina 05/05/2016  . Nausea without vomiting 03/26/2016  . Generalized anxiety disorder 01/21/2016  . Abdominal distension 01/21/2016  . Obesity 12/19/2015  . Hypercalcemia 10/03/2015  . Need for hepatitis C screening test 10/03/2015  . Encounter for routine adult medical exam with abnormal findings 01/17/2015  . Lipoma of arm 12/25/2014  . Macular degeneration 12/25/2014  . Medicare annual wellness visit, subsequent 01/04/2014  . Vaginal irritation  12/22/2013  . Osteoporosis 03/07/2013  . Chronic back pain 03/07/2013  . Weight gain 09/20/2012  . Essential hypertension, benign 05/31/2012  . Coronary artery disease 05/31/2012  . GERD (gastroesophageal reflux disease) 05/31/2012  . Allergic rhinitis 05/31/2012  . Primary hyperparathyroidism ([place]) 05/31/2012  . General medical exam 11/19/2011  . Neuropathy 11/19/2011  . PVD (peripheral vascular disease) ([place]) 07/30/2010  . Hyperlipidemia 03/26/2010  . CAD, AUTOLOGOUS BYPASS GRAFT 03/26/2010  . MULTI-VESSEL OR  BILATERAL STENOSIS W/O INFARCTION 03/26/2010  . TOBACCO USE, QUIT 03/26/2010    Past Surgical History:  Procedure Laterality Date  . CARDIAC CATHETERIZATION  2013   [facility]  . CATARACT EXTRACTION    . COLPOSCOPY    . CORONARY ARTERY BYPASS GRAFT  02/07/2010   x 3  . FACIAL COSMETIC SURGERY  2013   Dr. Nadeen Landau  . FOOT SURGERY     right  . ILIO-FEMORAL BYPASS GRAFT  2011  . MITRAL VALVE REPAIR  01/2010  . ORIF ANKLE FRACTURE Right 10/08/2016   Procedure: OPEN REDUCTION INTERNAL FIXATION (ORIF) ANKLE FRACTURE;  Surgeon: Corky Mull, MD;  Location: [facility] ORS;  Service: Orthopedics;  Laterality: Right;  . PARATHYROIDECTOMY    . TUBAL LIGATION      Prior to Admission medications   Medication Sig Start Date End Date Taking? Authorizing Provider  ACIDOPHILUS LACTOBACILLUS PO Take 1 tablet by mouth daily.    [provider]  aspirin 81 MG EC tablet Take 81 mg by mouth daily.     [provider]  Cholecalciferol (VITAMIN D3) 1000 UNITS CAPS Take 1,000 Units by mouth daily.     [provider]  Coenzyme Q10 100 MG capsule Take 100 mg by mouth daily.     [provider]  HYDROcodone-acetaminophen (NORCO) 7.5-325 MG tablet Take 1 tablet by mouth every 6 (six) hours as needed for moderate pain. 10/09/16   Reche Dixon, PA-C  IRON PO Take 325 mg by mouth daily.     [provider]  Astrid Drafts 350 MG CAPS Take 1 capsule by mouth daily.    [provider]  metoprolol tartrate (LOPRESSOR) 25 MG tablet Take 0.5 tablets (12.5 mg total) by mouth 2 (two) times daily. 05/13/17   Minna Merritts, MD  Multiple Vitamins-Minerals (PRESERVISION AREDS 2) CAPS Take 1 capsule by mouth 2 (two) times daily.    [provider]  pantoprazole (PROTONIX) 40 MG tablet TAKE ONE (1) TABLET BY MOUTH EVERY DAY 07/12/17   Burnard Hawthorne, FNP  Probiotic Product (RISA-BID PROBIOTIC PO) Take 250 mg by mouth 2 (two) times daily.     [provider]   rosuvastatin (CRESTOR) 40 MG tablet TAKE ONE (1) TABLET EACH DAY 05/13/17   Minna Merritts, MD  sertraline (ZOLOFT) 50 MG tablet TAKE TWO TABLETS BY MOUTH AT BEDTIME DAILY 07/12/17   Burnard Hawthorne, FNP    Allergies Patient has no known allergies.  Family History  Family history unknown: Yes    Social History Social History   Tobacco Use  . Smoking status: Former Smoker    Packs/day: 1.00    Years: 40.00    Pack years: 40.00    Types: Cigarettes    Last attempt to quit: 07/15/2009    Years since quitting: 8.0  . Smokeless tobacco: Never Used  . Tobacco comment: quit 06/2009 smoked from 20s to 2011 1.5 ppd no FH lung cancer   Substance Use Topics  . Alcohol use: Yes  Alcohol/week: 0.5 oz    Types: 1 Standard drinks or equivalent per week    Comment: occ.  . Drug use: No    Review of Systems Constitutional: No fever/chills Eyes: No visual changes. ENT: No sore throat. No stiff neck no neck pain Cardiovascular: Denies chest pain. Respiratory: Denies shortness of breath. Gastrointestinal:   no vomiting.  No diarrhea.  No constipation. Genitourinary: Negative for dysuria. Musculoskeletal: Negative lower extremity swelling Skin: Negative for rash. Neurological: Negative for severe headaches, focal weakness or numbness.   ____________________________________________   PHYSICAL EXAM:  VITAL SIGNS: ED Triage Vitals  Enc Vitals Group     BP 08/03/17 1851 126/86     Pulse Rate 08/03/17 1851 93     Resp 08/03/17 1851 17     Temp 08/03/17 1851 99.4 F (37.4 C)     Temp Source 08/03/17 1851 Oral     SpO2 08/03/17 1851 95 %     Weight 08/03/17 1851 138 lb (62.6 kg)     Height 08/03/17 1851 5' (1.524 m)     Head Circumference --      Peak Flow --      Pain Score 08/03/17 1906 0     Pain Loc --      Pain Edu? --      Excl. in [place]? --     Constitutional: Alert and oriented. Well appearing and in no acute distress. Eyes: Conjunctivae are normal Head:  Atraumatic HEENT: No congestion/rhinnorhea. Mucous membranes are moist.  Oropharynx non-erythematous Neck:   Nontender with no meningismus, no masses, no stridor Cardiovascular: Normal rate, regular rhythm. Grossly normal heart sounds.  Good peripheral circulation. Respiratory: Normal respiratory effort.  No retractions. Lungs CTAB. Abdominal: Soft and nontender. No distention. No guarding no rebound Back:  There is no focal tenderness or step off.  there is no midline tenderness there are no lesions noted. there is no CVA tenderness Musculoskeletal: No lower extremity tenderness, no upper extremity tenderness. No joint effusions, no DVT signs strong distal pulses no edema Neurologic:  Normal speech and language. No gross focal neurologic deficits are appreciated.  Skin:  Skin is warm, dry and intact. No rash noted. Psychiatric: Mood and affect are normal. Speech and behavior are normal.  ____________________________________________   LABS (all labs ordered are listed, but only abnormal results are displayed)  Labs Reviewed  URINALYSIS, COMPLETE (UACMP) WITH MICROSCOPIC  TROPONIN I    Pertinent labs  results that were available during my care of the patient were reviewed by me and considered in my medical decision making (see chart for details). ____________________________________________  EKG  I personally interpreted any EKGs ordered by me or triage Sinus rhythm at 99 bpm there is ST elevation isolated in V1 which is old compared to prior EKGs, there is no acute change in that, borderline LAD, no acute ischemia ____________________________________________  RADIOLOGY  Pertinent labs & imaging results that were available during my care of the patient were reviewed by me and considered in my medical decision making (see chart for details). If possible, patient and/or family made aware of any abnormal findings.  No results found. ____________________________________________     PROCEDURES  Procedure(s) performed: None  Procedures  Critical Care performed: None  ____________________________________________   INITIAL IMPRESSION / ASSESSMENT AND PLAN / ED COURSE  Pertinent labs & imaging results that were available during my care of the patient were reviewed by me and considered in my medical decision making (see chart for details).  Patient apparently had back pain yesterday which is completely gone today she is asymptomatic today.  Differential includes musculoskeletal pain, muscle cramps, or something more sinister such as PE or dissection.  Very low suspicion for dissection given gradual onset of pain etc., she has symmetric pulses, we will however obtain CT to rule out PE as there could have been some vaguely described pleuritic component to this and her d-dimer is elevated.  We will see what that shows.  Also send a troponin, if that is negative I do not think serial enzymes will be of utility.  ----------------------------------------- 11:15 PM on 08/03/2017 -----------------------------------------  Patient states that she has had this many times in the past now that she remembers, our workup did lead [place] to the gallbladder which I think is the cause of her pain however she has no tenderness and she would like to go home.  I did discuss with Dr. Hampton Abbot approximately a few hours ago in the surgical suite.  He is unable to come immediately down because he was performing surgery.  I did advise the patient even though she feels okay,   given her liver function test elevation, her white count and her ultrasound findings that even though she is not having a lot of pain it would be in her best interest to be seen by surgery, she initially agreed but now would like to go home.  I then discussed again with Dr. Hampton Abbot who is going to try to come down as soon as possible, I will try to induce the patient at least to stay to talk to surgery because I am concerned  about all of her findings.  My worry is that she will go home and have a bad outcome from this.  Patient therefore and I will have another conference trying to encourage her to be patient with surgery who are doing her best to come see her.  ----------------------------------------- 11:35 PM on 08/03/2017 -----------------------------------------  On abdominal exam at this time, the palpation of the right upper quadrant does betray abdominal discomfort and although voluntary guarding no involuntary guarding she still has a nonsurgical abdomen after discussing with surgery we are giving her Rocephin surgery will come see her and she does agree to stay    ____________________________________________   FINAL CLINICAL IMPRESSION(S) / ED DIAGNOSES  Final diagnoses:  None      This chart was dictated using voice recognition software.  Despite best efforts to proofread,  errors can occur which can change meaning.      Schuyler Amor, MD 08/03/17 1940    Schuyler Amor, MD 08/03/17 2322    Schuyler Amor, MD 08/03/17 [phone]

## 2017-08-03 NOTE — ED Notes (Signed)
Pt left upper arm has increased swelling.  CT tech informed me that IV had infiltrated after CTA, small area of swelling was noted at that time and IV was removed.  Swelling has been increasing, spoke with CT tech earlier and now called CT to come see her arm.  Pt arm while swollen is non tender.  Attempt placement of new IV in right hand was unsuccessful at this time.

## 2017-08-03 NOTE — ED Notes (Signed)
Pt attempted to collect urine sample. She went to bathroom without any success.  Pt to CT at this time

## 2017-08-03 NOTE — ED Triage Notes (Signed)
Pt in with co upper back pain went to cardiologist today. Pt has been having chills and fever no cold symptoms. Cardiologist was concerned about PE no chest pain or shob, no hx of the same.

## 2017-08-03 NOTE — Progress Notes (Signed)
Office Visit    Patient Name: [name] Date of Encounter: 08/03/2017  Primary Care Provider:  Burnard Hawthorne, FNP Primary Cardiologist:  Ida Rogue, MD  Chief Complaint    72 year old female with a prior history of CAD, hypertension, hyperlipidemia, and peripheral vascular disease, who presents for follow-up related to an episode of back pain.  Past Medical History    Past Medical History:  Diagnosis Date  . Abnormal Pap smear of cervix    Dr. Prentice Docker   . Coronary artery disease    a. 06/2009 MI/PCI to LCX and LAD; b. 01/2011 Occluded stents-->CABG x 3 ([facility]): LIMA->LAD, VG->LCX, VG->RPDA; c. 01/2012 Cath: LM 50, LAD 95ost, 174m, LCX 95ost, 110m, RCA 79m, RPDA small, LIMA->LAD ok, VG->LCX ok, VG->RPDA 100-->Med rx.  . Degenerative disc disease, lumbar    L4/5 noted CT ab/pelvis 05/19/11   . Hyperlipidemia   . Hypertension    under control  . Macular degeneration   . Parathyroid adenoma    right 13 x 8 mm s/p resection   . PVD (peripheral vascular disease) ([place])    a. 01/2010 s/p Aortofemoral bypass.   Past Surgical History:  Procedure Laterality Date  . CARDIAC CATHETERIZATION  2013   [facility]  . CATARACT EXTRACTION    . COLPOSCOPY    . CORONARY ARTERY BYPASS GRAFT  02/07/2010   x 3  . FACIAL COSMETIC SURGERY  2013   Dr. Nadeen Landau  . FOOT SURGERY     right  . ILIO-FEMORAL BYPASS GRAFT  2011  . MITRAL VALVE REPAIR  01/2010  . ORIF ANKLE FRACTURE Right 10/08/2016   Procedure: OPEN REDUCTION INTERNAL FIXATION (ORIF) ANKLE FRACTURE;  Surgeon: Corky Mull, MD;  Location: [facility] ORS;  Service: Orthopedics;  Laterality: Right;  . PARATHYROIDECTOMY    . TUBAL LIGATION      Allergies  No Known Allergies  History of Present Illness    72 year old female with the above past medical history including coronary artery disease status post prior circumflex and LAD stenting with subsequent CABG x3 performed at [facility] in October 2012.  Her last  catheterization was in October 2013 revealing native multivessel disease with a patent LIMA to the LAD and vein to the left circumflex.  The vein to the RPDA was occluded.  She has been medically managed since.  Other history includes hypertension, hyperlipidemia, peripheral vascular disease status post aortofemoral bypass, degenerative disc disease, and abnormal Pap smear of the cervix.  Last week, she required a cervical biopsy.  She said that was very painful.  The following day, she noted fevers and chills with malaise.  Symptoms improved after 1 day but since, she has been feeling some fatigue and feeling as though she needs a nap.  She was at work yesterday afternoon when she began to experience a discomfort between her shoulder blades.  She felt like this was somewhat worse when taking a deep breath.  She left work early and went home and took a nap.  When she awoke around 8:00 last night, symptoms had resolved. She denies experiencing any chest pain, shoulder pain, or jaw pain, all of which were prior anginal equivalents.  She called the office in between episodes of back pain yesterday and appointment was made for today.  She has had no recurrence of back pain or dyspnea.  She denies any recent prolonged travel.  She does sit at work but then goes on to say that she has to get  up and walk fairly frequently.  She denies any PND, orthopnea, dizziness, syncope, edema, palpitations, lower extremity pain, or early satiety.  Home Medications    Prior to Admission medications   Medication Sig Start Date End Date Taking? Authorizing Provider  ACIDOPHILUS LACTOBACILLUS PO Take 1 tablet by mouth daily.   Yes [provider]  aspirin 81 MG EC tablet Take 81 mg by mouth daily.    Yes [provider]  Cholecalciferol (VITAMIN D3) 1000 UNITS CAPS Take 1,000 Units by mouth daily.    Yes [provider]  Coenzyme Q10 100 MG capsule Take 100 mg by mouth daily.    Yes [provider]  HYDROcodone-acetaminophen (NORCO) 7.5-325 MG tablet Take 1 tablet by mouth every 6 (six) hours as needed for moderate pain. 10/09/16  Yes Reche Dixon, PA-C  IRON PO Take 325 mg by mouth daily.    Yes [provider]  Astrid Drafts 350 MG CAPS Take 1 capsule by mouth daily.   Yes [provider]  metoprolol tartrate (LOPRESSOR) 25 MG tablet Take 0.5 tablets (12.5 mg total) by mouth 2 (two) times daily. 05/13/17  Yes Gollan, Kathlene November, MD  Multiple Vitamins-Minerals (PRESERVISION AREDS 2) CAPS Take 1 capsule by mouth 2 (two) times daily.   Yes [provider]  pantoprazole (PROTONIX) 40 MG tablet TAKE ONE (1) TABLET BY MOUTH EVERY DAY 07/12/17  Yes Arnett, Yvetta Coder, FNP  Probiotic Product (RISA-BID PROBIOTIC PO) Take 250 mg by mouth 2 (two) times daily.    Yes [provider]  rosuvastatin (CRESTOR) 40 MG tablet TAKE ONE (1) TABLET EACH DAY 05/13/17  Yes Gollan, Kathlene November, MD  sertraline (ZOLOFT) 50 MG tablet TAKE TWO TABLETS BY MOUTH AT BEDTIME DAILY 07/12/17  Yes Burnard Hawthorne, FNP    Review of Systems    Upper back pain that was worse with deep breathing occurring yesterday for several hours.  No recurrence.  She had flulike symptoms with fevers and chills and malaise last week.  The symptoms resolved within 24 hours but she has been feeling more tired and lower energy since then.  She denies chest pain, palpitations, PND, orthopnea, dizziness, syncope, edema, or early satiety.  All other systems reviewed and are otherwise negative except as noted above.  Physical Exam    VS:  BP 110/60 (BP Location: Left Arm, Patient Position: Sitting, Cuff Size: Normal)   Pulse (!) 104   Temp 97.8 F (36.6 C)   Ht 5' (1.524 m)   Wt 138 lb 4 oz (62.7 kg)   BMI 27.00 kg/m  , BMI Body mass index is 27 kg/m. GEN: Well nourished, well developed, in no acute distress.  HEENT: normal.  Neck: Supple, no JVD, carotid bruits, or masses. Cardiac: RRR, tachycardic, no  murmurs, rubs, or gallops. No clubbing, cyanosis, edema.  Radials/DP/PT 2+ and equal bilaterally.  Respiratory:  Respirations regular and unlabored, clear to auscultation bilaterally. GI: Soft, nontender, nondistended, BS + x 4. MS: no deformity or atrophy. Skin: warm and dry, no rash. Neuro:  Strength and sensation are intact. Psych: Normal affect.  Accessory Clinical Findings    ECG -sinus tachycardia, 104, left axis deviation, mild lateral ST depression.  Findings are similar to prior ECGs.  Assessment & Plan    1.  Upper back pain: Patient was sitting at work yesterday when she had sudden onset of upper back pain associated with mild dyspnea.  There was a pleuritic component to this discomfort.  It was bad enough that she left work and then went home and took a nap.  When she woke up many hours later, at about 8 PM, it symptoms had resolved.  She has had no recurrence of either back pain or dyspnea.  She is tachycardic in clinic today but in no acute distress.  She has not any recent prolonged travel.  She does sit a good portion of her day at work but also notes that she walks a fair amount while there as well.  She has not had any lower extremity edema or leg pain.  No palpable cord on exam.  I am sending her over to the hospital lab for a stat d-dimer to rule out PE.  If elevated, she will need to go to the ER for CT angiogram of the chest.  2.  Coronary artery disease: She says she has been doing well since catheterization in 2013.  She is reasonably active without significant limitations.  Though she had back pain yesterday, she does not identify that as a typical anginal equivalent.  Previously, she had substernal chest discomfort with shoulder and jaw pain.  ECG is nonacute today.  I will check a stat troponin.  She remains on aspirin, beta-blocker, and statin therapy.  3.  Essential hypertension: Stable on beta-blocker.  4.  Hyperlipidemia: LDL was 53 in December 2018.  Continue  statin therapy.  5.  General malaise: Patient had flulike symptoms with fevers and chills last week.  Those symptoms resolved within about a day but she has been feeling very tired ever since.  As above, ECG nonacute.  She has not been having any significant dyspnea on exertion or chest pain.  Exam is nonfocal.  I will follow-up a CBC and basic metabolic panel.  6.  Sinus tachycardia: Etiology unclear.  No distress in clinic.  Checking CBC, basic metabolic panel, troponin, and d-dimer.  TSH was normal in December.  If lab work unrevealing, will arrange for echocardiogram.  7.  Disposition: Follow-up lab work as above.  If unrevealing, we will arrange for echo and subsequent office follow-up within the next few weeks.  Murray Hodgkins, NP 08/03/2017, 4:44 PM

## 2017-08-03 NOTE — Telephone Encounter (Signed)
See other message

## 2017-08-03 NOTE — Patient Instructions (Signed)
Medication Instructions:  Your physician recommends that you continue on your current medications as directed. Please refer to the Current Medication list given to you today.   Labwork: Your physician recommends that you return for lab work in: TODAY  STAT (CBC, BMET, D-DIMER, TROPONIN.)  - Please go to the [facility]. You will check in at the front desk to the right as you walk into the atrium. Valet Parking is offered if needed.    Testing/Procedures: none  Follow-Up: Your physician recommends that you schedule a follow-up appointment in: TO BE DETERMINED AFTER LABS.    If you need a refill on your cardiac medications before your next appointment, please call your pharmacy.

## 2017-08-03 NOTE — Telephone Encounter (Signed)
Pt returning [place] phone call

## 2017-08-03 NOTE — ED Notes (Signed)
Surgeon is here to see pt

## 2017-08-03 NOTE — ED Notes (Signed)
Patient transported to Ultrasound 

## 2017-08-04 ENCOUNTER — Telehealth: Payer: Self-pay

## 2017-08-04 ENCOUNTER — Encounter: Payer: Self-pay | Admitting: Obstetrics and Gynecology

## 2017-08-04 DIAGNOSIS — I10 Essential (primary) hypertension: Secondary | ICD-10-CM | POA: Diagnosis not present

## 2017-08-04 DIAGNOSIS — Z951 Presence of aortocoronary bypass graft: Secondary | ICD-10-CM | POA: Diagnosis not present

## 2017-08-04 DIAGNOSIS — I2510 Atherosclerotic heart disease of native coronary artery without angina pectoris: Secondary | ICD-10-CM | POA: Diagnosis not present

## 2017-08-04 DIAGNOSIS — Z79899 Other long term (current) drug therapy: Secondary | ICD-10-CM | POA: Diagnosis not present

## 2017-08-04 DIAGNOSIS — K819 Cholecystitis, unspecified: Secondary | ICD-10-CM | POA: Diagnosis not present

## 2017-08-04 DIAGNOSIS — R9389 Abnormal findings on diagnostic imaging of other specified body structures: Secondary | ICD-10-CM | POA: Diagnosis not present

## 2017-08-04 DIAGNOSIS — Z7982 Long term (current) use of aspirin: Secondary | ICD-10-CM | POA: Diagnosis not present

## 2017-08-04 DIAGNOSIS — Z87891 Personal history of nicotine dependence: Secondary | ICD-10-CM | POA: Diagnosis not present

## 2017-08-04 MED ORDER — HYDROCODONE-ACETAMINOPHEN 5-325 MG PO TABS: 1.0000 | ORAL_TABLET | Freq: Four times a day (QID) | ORAL | 0 refills | Status: DC | PRN

## 2017-08-04 MED ORDER — AMOXICILLIN-POT CLAVULANATE 875-125 MG PO TABS: 1.0000 | ORAL_TABLET | Freq: Two times a day (BID) | ORAL | 0 refills | Status: AC

## 2017-08-04 MED ORDER — ONDANSETRON 4 MG PO TBDP: 4.0000 mg | ORAL_TABLET | Freq: Three times a day (TID) | ORAL | 0 refills | Status: DC | PRN

## 2017-08-04 NOTE — Consult Note (Signed)
Date of Consultation:  08/04/2017  Requesting Physician:  Charlotte Crumb  Reason for Consultation:  Back pain  History of Present Illness: Katasha Riga Pinkus is a 72 y.o. female who presented to the ED for further evaluation of back pain.  The patient had an episode of back pain between her scapula yesterday.  She went to her cardiologist today and had an elevated D-dimer, so she came to the ED for further workup and CT chest PE protocol.  The patient reports only that episode of pain without any other associated symptoms.  Denies any fevers or chills, chest pain, shortness of breath, nausea, vomiting, abdominal pain.  In the ED, her workup showed no PE but a possibly inflamed gallbladder.  Her ultrasound showed some gallbladder wall thickening with some area of pericholecystic fluid.  Her labs showed a WBC of 14.7, with tbili of 1.3, AST 38, ALT 92, and AP 135.  Past Medical History: Past Medical History:  Diagnosis Date  . Abnormal Pap smear of cervix    Dr. Prentice Docker   . Coronary artery disease    a. 06/2009 MI/PCI to LCX and LAD; b. 01/2011 Occluded stents-->CABG x 3 ([facility]): LIMA->LAD, VG->LCX, VG->RPDA; c. 01/2012 Cath: LM 50, LAD 95ost, 15m, LCX 95ost, 124m, RCA 63m, RPDA small, LIMA->LAD ok, VG->LCX ok, VG->RPDA 100-->Med rx.  . Degenerative disc disease, lumbar    L4/5 noted CT ab/pelvis 05/19/11   . Hyperlipidemia   . Hypertension    under control  . Macular degeneration   . Parathyroid adenoma    right 13 x 8 mm s/p resection   . PVD (peripheral vascular disease) ([place])    a. 01/2010 s/p Aortofemoral bypass.     Past Surgical History: Past Surgical History:  Procedure Laterality Date  . CARDIAC CATHETERIZATION  2013   [facility]  . CATARACT EXTRACTION    . COLPOSCOPY    . CORONARY ARTERY BYPASS GRAFT  02/07/2010   x 3  . FACIAL COSMETIC SURGERY  2013   Dr. Nadeen Landau  . FOOT SURGERY     right  . ILIO-FEMORAL BYPASS GRAFT  2011  . MITRAL VALVE REPAIR  01/2010  . ORIF  ANKLE FRACTURE Right 10/08/2016   Procedure: OPEN REDUCTION INTERNAL FIXATION (ORIF) ANKLE FRACTURE;  Surgeon: Corky Mull, MD;  Location: [facility] ORS;  Service: Orthopedics;  Laterality: Right;  . PARATHYROIDECTOMY    . TUBAL LIGATION      Home Medications: Prior to Admission medications   Medication Sig Start Date End Date Taking? Authorizing Provider  ACIDOPHILUS LACTOBACILLUS PO Take 1 tablet by mouth daily.    [provider]  amoxicillin-clavulanate (AUGMENTIN) 875-125 MG tablet Take 1 tablet by mouth 2 (two) times daily for 10 days. 08/04/17 08/14/17  Darel Hong, MD  aspirin 81 MG EC tablet Take 81 mg by mouth daily.     [provider]  Cholecalciferol (VITAMIN D3) 1000 UNITS CAPS Take 1,000 Units by mouth daily.     [provider]  Coenzyme Q10 100 MG capsule Take 100 mg by mouth daily.     [provider]  HYDROcodone-acetaminophen (NORCO) 5-325 MG tablet Take 1 tablet by mouth every 6 (six) hours as needed for up to 9 doses for severe pain. 08/04/17   Darel Hong, MD  IRON PO Take 325 mg by mouth daily.     [provider]  Astrid Drafts 350 MG CAPS Take 1 capsule by mouth daily.    [provider]  metoprolol tartrate (LOPRESSOR) 25 MG tablet Take 0.5 tablets (12.5 mg total) by mouth 2 (two) times daily. 05/13/17   Minna Merritts, MD  Multiple Vitamins-Minerals (PRESERVISION AREDS 2) CAPS Take 1 capsule by mouth 2 (two) times daily.    [provider]  ondansetron (ZOFRAN ODT) 4 MG disintegrating tablet Take 1 tablet (4 mg total) by mouth every 8 (eight) hours as needed for nausea or vomiting. 08/04/17   Darel Hong, MD  pantoprazole (PROTONIX) 40 MG tablet TAKE ONE (1) TABLET BY MOUTH EVERY DAY 07/12/17   Burnard Hawthorne, FNP  Probiotic Product (RISA-BID PROBIOTIC PO) Take 250 mg by mouth 2 (two) times daily.     [provider]  rosuvastatin (CRESTOR) 40 MG tablet TAKE ONE (1) TABLET EACH DAY 05/13/17    Minna Merritts, MD  sertraline (ZOLOFT) 50 MG tablet TAKE TWO TABLETS BY MOUTH AT BEDTIME DAILY 07/12/17   Burnard Hawthorne, FNP    Allergies: No Known Allergies  Social History:  reports that she quit smoking about 8 years ago. Her smoking use included cigarettes. She has a 40.00 pack-year smoking history. She has never used smokeless tobacco. She reports that she drinks about 0.5 oz of alcohol per week. She reports that she does not use drugs.   Family History: Family History  Family history unknown: Yes    Review of Systems: Review of Systems  Constitutional: Negative for chills and fever.  HENT: Negative for hearing loss.   Respiratory: Negative for cough.   Cardiovascular: Negative for chest pain.  Gastrointestinal: Negative for abdominal pain, constipation, diarrhea, nausea and vomiting.  Genitourinary: Negative for dysuria.  Musculoskeletal: Positive for back pain.  Neurological: Negative for dizziness.  Psychiatric/Behavioral: Negative for depression.  All other systems reviewed and are negative.   Physical Exam BP (!) 119/55   Pulse 80   Temp 99.4 F (37.4 C) (Oral)   Resp 16   Ht 5' (1.524 m)   Wt 138 lb (62.6 kg)   SpO2 100%   BMI 26.95 kg/m  CONSTITUTIONAL: No acute distress HEENT:  Normocephalic, atraumatic, extraocular motion intact. NECK: Trachea is midline, and there is no jugular venous distension. RESPIRATORY:  Lungs are clear, and breath sounds are equal bilaterally. Normal respiratory effort without pathologic use of accessory muscles. CARDIOVASCULAR: Heart is regular without murmurs, gallops, or rubs. GI: The abdomen is soft, nondistended, with some mild tenderness to palpation over the right upper quadrant.  Negative Murphy's sign. There were no palpable masses.  MUSCULOSKELETAL:  Normal muscle strength and tone in all four extremities.  No peripheral edema or cyanosis. SKIN: Skin turgor is normal. There are no pathologic skin lesions.   NEUROLOGIC:  Motor and sensation is grossly normal.  Cranial nerves are grossly intact. PSYCH:  Alert and oriented to person, place and time. Affect is normal.  Laboratory Analysis: Results for orders placed or performed during the hospital encounter of 08/03/17 (from the past 24 hour(s))  Urinalysis, Complete w Microscopic     Status: Abnormal   Collection Time: 08/03/17  7:31 PM  Result Value Ref Range   Color, Urine YELLOW (A) YELLOW   APPearance CLOUDY (A) CLEAR   Specific Gravity, Urine >1.046 (H) 1.005 - 1.030   pH 5.0 5.0 - 8.0   Glucose, UA NEGATIVE NEGATIVE mg/dL   Hgb urine dipstick NEGATIVE NEGATIVE   Bilirubin Urine NEGATIVE NEGATIVE   Ketones, ur 20 (A) NEGATIVE mg/dL   Protein, ur 30 (A) NEGATIVE mg/dL  Nitrite NEGATIVE NEGATIVE   Leukocytes, UA LARGE (A) NEGATIVE   RBC / HPF 6-30 0 - 5 RBC/hpf   WBC, UA TOO NUMEROUS TO COUNT 0 - 5 WBC/hpf   Bacteria, UA RARE (A) NONE SEEN   Squamous Epithelial / LPF 0-5 (A) NONE SEEN   Mucus PRESENT   Troponin I     Status: None   Collection Time: 08/03/17  7:31 PM  Result Value Ref Range   Troponin I <0.03 <0.03 ng/mL  Hepatic function panel     Status: Abnormal   Collection Time: 08/03/17  8:27 PM  Result Value Ref Range   Total Protein 7.7 6.5 - 8.1 g/dL   Albumin 3.9 3.5 - 5.0 g/dL   AST 38 15 - 41 U/L   ALT 92 (H) 14 - 54 U/L   Alkaline Phosphatase 135 (H) 38 - 126 U/L   Total Bilirubin 1.3 (H) 0.3 - 1.2 mg/dL   Bilirubin, Direct 0.2 0.1 - 0.5 mg/dL   Indirect Bilirubin 1.1 (H) 0.3 - 0.9 mg/dL  Lipase, blood     Status: None   Collection Time: 08/03/17  8:27 PM  Result Value Ref Range   Lipase 26 11 - 51 U/L    Imaging: Ct Angio Chest Pe W And/or Wo Contrast  Result Date: 08/03/2017 CLINICAL DATA:  Back pain EXAM: CT ANGIOGRAPHY CHEST WITH CONTRAST TECHNIQUE: Multidetector CT imaging of the chest was performed using the standard protocol during bolus administration of intravenous contrast. Multiplanar CT image  reconstructions and MIPs were obtained to evaluate the vascular anatomy. CONTRAST:  38mL OMNIPAQUE IOHEXOL 350 MG/ML SOLN COMPARISON:  Chest x-ray 01/31/2012 FINDINGS: Cardiovascular: Suboptimal opacification of the pulmonary arterial system limits evaluation for pulmonary emboli. No acute central embolus is seen. Post CABG changes. No aneurysm. Aortic atherosclerosis. Coronary vascular calcification. Borderline cardiomegaly. No pericardial effusion. Mitral valve prosthesis. Mediastinum/Nodes: Midline trachea. No thyroid mass. No significantly enlarged lymph nodes. Esophagus within normal limits. Lungs/Pleura: Mild apical emphysema. No acute consolidation, pleural effusion or pneumothorax Upper Abdomen: Possible inflammatory changes of the gallbladder fundus, incompletely visualized. Musculoskeletal: Post sternotomy.  Degenerative changes. Review of the MIP images confirms the above findings. IMPRESSION: 1. Suboptimal opacification of the pulmonary arterial system limits the exam. No acute central embolus is seen. 2. Possible partially visualized inflammatory changes at the gallbladder fundus, recommend correlation with right upper quadrant abdominal ultrasound 3. Minimal apical emphysema.  No acute pulmonary infiltrate. Aortic Atherosclerosis (ICD10-I70.0) and Emphysema (ICD10-J43.9). Electronically Signed   By: Donavan Foil M.D.   On: 08/03/2017 20:17   US Abdomen Limited Ruq  Result Date: 08/03/2017 CLINICAL DATA:  Abdominal pain EXAM: ULTRASOUND ABDOMEN LIMITED RIGHT UPPER QUADRANT COMPARISON:  Chest CT including portions of upper abdomen August 03, 2017 FINDINGS: Gallbladder: Within the gallbladder, there is an echogenic focus which moves and shadows measuring 1.6 cm in length consistent with cholelithiasis. The gallbladder wall is thickened and edematous. There is trace pericholecystic fluid. No sonographic Murphy sign noted by sonographer. Common bile duct: Diameter: 5 mm. No intrahepatic or extrahepatic  biliary duct dilatation. Liver: No focal lesion identified. Within normal limits in parenchymal echogenicity. Portal vein is patent on color Doppler imaging with normal direction of blood flow towards the liver. IMPRESSION: Cholelithiasis with thickened edematous gallbladder wall and trace pericholecystic fluid. These are findings felt to be indicative acute cholecystitis. Study otherwise unremarkable. Electronically Signed   By: Lowella Grip III M.D.   On: 08/03/2017 21:11    Assessment and Plan: This  is a 72 y.o. female who presents with back pain, and workup revealing possible cholecystitis.  I have independently viewed the patient's imaging studies and reviewed the patient's laboratory studies.   Discussed with the patient the possibilities for inpatient vs outpatient management.  Offered admission to hospital for IV antibiotics and eventual cholecystectomy.  However, The OR schedule is very busy and there is no time for her surgery on 4/17, and possibly no time on 4/18.  She other option would be discharging her to home with an antibiotic course of Augmentin.  She has chosen to go home rather than wait without a specific date or time of surgery.  Given that her presenting symptom was really back pain, it is likely that she had a stronger episode of biliary colic rather than true cholecystitis, though her WBC is also elevated.  She is not having any nausea or vomiting and has been able to eat without worsening pain.  She will get a dose of Ceftriaxone in the hospital and then a prescription for Augmentin.  She will have close follow up in office next week.  She does understand return precautions discussed with her.      Melvyn Neth, [place]

## 2017-08-04 NOTE — Telephone Encounter (Signed)
Duplicate task. msg assigned to SDJ , he called her and lm to call back

## 2017-08-04 NOTE — Telephone Encounter (Signed)
Left message for patient to call office to confirm the below appointment.  Dr.Piscoya 08/09/17 @ 2:30 pm

## 2017-08-04 NOTE — Discharge Instructions (Signed)
Please take all of your antibiotics as prescribed and return to the emergency department for any concerns such as fevers, chills, if you cannot eat or drink, for worsening pain, or for any other issues whatsoever.  Otherwise follow-up with Dr. Hampton Abbot in clinic this coming week for reevaluation.  It was a pleasure to take care of you today, and thank you for coming to our emergency department.  If you have any questions or concerns before leaving please ask the nurse to grab me and I'm more than happy to go through your aftercare instructions again.  If you were prescribed any opioid pain medication today such as Norco, Vicodin, Percocet, morphine, hydrocodone, or oxycodone please make sure you do not drive when you are taking this medication as it can alter your ability to drive safely.  If you have any concerns once you are home that you are not improving or are in fact getting worse before you can make it to your follow-up appointment, please do not hesitate to call 911 and come back for further evaluation.  Darel Hong, MD  Results for orders placed or performed during the hospital encounter of 08/03/17  Urinalysis, Complete w Microscopic  Result Value Ref Range   Color, Urine YELLOW (A) YELLOW   APPearance CLOUDY (A) CLEAR   Specific Gravity, Urine >1.046 (H) 1.005 - 1.030   pH 5.0 5.0 - 8.0   Glucose, UA NEGATIVE NEGATIVE mg/dL   Hgb urine dipstick NEGATIVE NEGATIVE   Bilirubin Urine NEGATIVE NEGATIVE   Ketones, ur 20 (A) NEGATIVE mg/dL   Protein, ur 30 (A) NEGATIVE mg/dL   Nitrite NEGATIVE NEGATIVE   Leukocytes, UA LARGE (A) NEGATIVE   RBC / HPF 6-30 0 - 5 RBC/hpf   WBC, UA TOO NUMEROUS TO COUNT 0 - 5 WBC/hpf   Bacteria, UA RARE (A) NONE SEEN   Squamous Epithelial / LPF 0-5 (A) NONE SEEN   Mucus PRESENT   Troponin I  Result Value Ref Range   Troponin I <0.03 <0.03 ng/mL  Hepatic function panel  Result Value Ref Range   Total Protein 7.7 6.5 - 8.1 g/dL   Albumin 3.9 3.5 -  5.0 g/dL   AST 38 15 - 41 U/L   ALT 92 (H) 14 - 54 U/L   Alkaline Phosphatase 135 (H) 38 - 126 U/L   Total Bilirubin 1.3 (H) 0.3 - 1.2 mg/dL   Bilirubin, Direct 0.2 0.1 - 0.5 mg/dL   Indirect Bilirubin 1.1 (H) 0.3 - 0.9 mg/dL  Lipase, blood  Result Value Ref Range   Lipase 26 11 - 51 U/L   Ct Angio Chest Pe W And/or Wo Contrast  Result Date: 08/03/2017 CLINICAL DATA:  Back pain EXAM: CT ANGIOGRAPHY CHEST WITH CONTRAST TECHNIQUE: Multidetector CT imaging of the chest was performed using the standard protocol during bolus administration of intravenous contrast. Multiplanar CT image reconstructions and MIPs were obtained to evaluate the vascular anatomy. CONTRAST:  2mL OMNIPAQUE IOHEXOL 350 MG/ML SOLN COMPARISON:  Chest x-ray 01/31/2012 FINDINGS: Cardiovascular: Suboptimal opacification of the pulmonary arterial system limits evaluation for pulmonary emboli. No acute central embolus is seen. Post CABG changes. No aneurysm. Aortic atherosclerosis. Coronary vascular calcification. Borderline cardiomegaly. No pericardial effusion. Mitral valve prosthesis. Mediastinum/Nodes: Midline trachea. No thyroid mass. No significantly enlarged lymph nodes. Esophagus within normal limits. Lungs/Pleura: Mild apical emphysema. No acute consolidation, pleural effusion or pneumothorax Upper Abdomen: Possible inflammatory changes of the gallbladder fundus, incompletely visualized. Musculoskeletal: Post sternotomy.  Degenerative changes. Review of the  MIP images confirms the above findings. IMPRESSION: 1. Suboptimal opacification of the pulmonary arterial system limits the exam. No acute central embolus is seen. 2. Possible partially visualized inflammatory changes at the gallbladder fundus, recommend correlation with right upper quadrant abdominal ultrasound 3. Minimal apical emphysema.  No acute pulmonary infiltrate. Aortic Atherosclerosis (ICD10-I70.0) and Emphysema (ICD10-J43.9). Electronically Signed   By: Donavan Foil  M.D.   On: 08/03/2017 20:17   US Abdomen Limited Ruq  Result Date: 08/03/2017 CLINICAL DATA:  Abdominal pain EXAM: ULTRASOUND ABDOMEN LIMITED RIGHT UPPER QUADRANT COMPARISON:  Chest CT including portions of upper abdomen August 03, 2017 FINDINGS: Gallbladder: Within the gallbladder, there is an echogenic focus which moves and shadows measuring 1.6 cm in length consistent with cholelithiasis. The gallbladder wall is thickened and edematous. There is trace pericholecystic fluid. No sonographic Murphy sign noted by sonographer. Common bile duct: Diameter: 5 mm. No intrahepatic or extrahepatic biliary duct dilatation. Liver: No focal lesion identified. Within normal limits in parenchymal echogenicity. Portal vein is patent on color Doppler imaging with normal direction of blood flow towards the liver. IMPRESSION: Cholelithiasis with thickened edematous gallbladder wall and trace pericholecystic fluid. These are findings felt to be indicative acute cholecystitis. Study otherwise unremarkable. Electronically Signed   By: Lowella Grip III M.D.   On: 08/03/2017 21:11

## 2017-08-04 NOTE — Telephone Encounter (Signed)
Pt hasn't heard from SDJ about biopsy results from last week.  [phone]

## 2017-08-04 NOTE — ED Provider Notes (Signed)
Dr. Hampton Abbot came and evaluated the patient and discussed inpatient versus outpatient options.  The patient has preferred to go home with Augmentin and follow-up this coming week.  She is able to eat and drink.  Her abdomen is not peritonitis.  She is discharged home after her first dose of ceftriaxone.  Strict return precautions have been given and the patient verbalizes understanding and agreement the plan.   Darel Hong, MD 08/04/17 0010

## 2017-08-05 NOTE — Telephone Encounter (Signed)
Spoke with patient regarding CIN 1 results. She understands to repeat pap smear in 1 week. She understands risk of developing cervical cancer, if she does not follow up.  Prentice Docker, MD, [facility], [facility] 08/05/2017 1:14 PM

## 2017-08-09 ENCOUNTER — Ambulatory Visit: Payer: Medicare HMO | Admitting: Surgery

## 2017-08-09 ENCOUNTER — Telehealth: Payer: Self-pay | Admitting: General Practice

## 2017-08-09 ENCOUNTER — Encounter: Payer: Self-pay | Admitting: Surgery

## 2017-08-09 VITALS — BP 124/71 | HR 74 | Temp 97.7°F | Ht 61.5 in | Wt 139.2 lb

## 2017-08-09 DIAGNOSIS — K819 Cholecystitis, unspecified: Secondary | ICD-10-CM

## 2017-08-09 NOTE — Patient Instructions (Signed)
Cholelithiasis Cholelithiasis is also called "gallstones." It is a kind of gallbladder disease. The gallbladder is an organ that stores a liquid (bile) that helps you digest fat. Gallstones may not cause symptoms (may be silent gallstones) until they cause a blockage, and then they can cause pain (gallbladder attack). Follow these instructions at home:  Take over-the-counter and prescription medicines only as told by your doctor.  Stay at a healthy weight.  Eat healthy foods. This includes: ? Eating fewer fatty foods, like fried foods. ? Eating fewer refined carbs (refined carbohydrates). Refined carbs are breads and grains that are highly processed, like white bread and white rice. Instead, choose whole grains like whole-wheat bread and brown rice. ? Eating more fiber. Almonds, fresh fruit, and beans are healthy sources of fiber.  Keep all follow-up visits as told by your doctor. This is important. Contact a doctor if:  You have sudden pain in the upper right side of your belly (abdomen). Pain might spread to your right shoulder or your chest. This may be a sign of a gallbladder attack.  You feel sick to your stomach (are nauseous).  You throw up (vomit).  You have been diagnosed with gallstones that have no symptoms and you get: ? Belly pain. ? Discomfort, burning, or fullness in the upper part of your belly (indigestion). Get help right away if:  You have sudden pain in the upper right side of your belly, and it lasts for more than 2 hours.  You have belly pain that lasts for more than 5 hours.  You have a fever or chills.  You keep feeling sick to your stomach or you keep throwing up.  Your skin or the whites of your eyes turn yellow (jaundice).  You have dark-colored pee (urine).  You have light-colored poop (stool). Summary  Cholelithiasis is also called "gallstones."  The gallbladder is an organ that stores a liquid (bile) that helps you digest fat.  Silent  gallstones are gallstones that do not cause symptoms.  A gallbladder attack may cause sudden pain in the upper right side of your belly. Pain might spread to your right shoulder or your chest. If this happens, contact your doctor.  If you have sudden pain in the upper right side of your belly that lasts for more than 2 hours, get help right away. This information is not intended to replace advice given to you by your health care provider. Make sure you discuss any questions you have with your health care provider. Document Released: 09/23/2007 Document Revised: 12/22/2015 Document Reviewed: 12/22/2015 Elsevier Interactive Patient Education  2017 [employer]  

## 2017-08-09 NOTE — Progress Notes (Signed)
08/09/2017  History of Present Illness: [name] is a 72 y.o. female who presents with for follow up of acute cholecystitis.  She presented to the ED on 4/16 with back pain.  Workup revealed possible cholecystitis.  Her WBC was elevated at 14.7 and tbili was 1.3, with AST 38, ALT 92, and AP 135.  She had no complaints of nausea, vomiting, or abdominal pain, but did have some discomfort in the right upper quadrant to deep palpation.  There was a lack of OR availability to try admission and cholecystectomy, and patient elected to go home with prescription for Augmentin and close follow up.  She presents today for further evaluation.  She reports she's been doing very well without any further symptoms.  Denies any fevers, chills, chest pain, shortness of breath, back pain, nausea, vomiting.  She is tolerating a low fat diet.    Past Medical History: Past Medical History:  Diagnosis Date  . Abnormal Pap smear of cervix    Dr. Prentice Docker   . Coronary artery disease    a. 06/2009 MI/PCI to LCX and LAD; b. 01/2011 Occluded stents-->CABG x 3 ([facility]): LIMA->LAD, VG->LCX, VG->RPDA; c. 01/2012 Cath: LM 50, LAD 95ost, 190m, LCX 95ost, 174m, RCA 62m, RPDA small, LIMA->LAD ok, VG->LCX ok, VG->RPDA 100-->Med rx.  . Degenerative disc disease, lumbar    L4/5 noted CT ab/pelvis 05/19/11   . Hyperlipidemia   . Hypertension    under control  . Macular degeneration   . Parathyroid adenoma    right 13 x 8 mm s/p resection   . PVD (peripheral vascular disease) ([place])    a. 01/2010 s/p Aortofemoral bypass.     Past Surgical History: Past Surgical History:  Procedure Laterality Date  . CARDIAC CATHETERIZATION  2013   [facility]  . CATARACT EXTRACTION    . COLPOSCOPY    . CORONARY ARTERY BYPASS GRAFT  02/07/2010   x 3  . FACIAL COSMETIC SURGERY  2013   Dr. Nadeen Landau  . FOOT SURGERY     right  . ILIO-FEMORAL BYPASS GRAFT  2011  . MITRAL VALVE REPAIR  01/2010  . ORIF ANKLE FRACTURE Right 10/08/2016    Procedure: OPEN REDUCTION INTERNAL FIXATION (ORIF) ANKLE FRACTURE;  Surgeon: Corky Mull, MD;  Location: [facility] ORS;  Service: Orthopedics;  Laterality: Right;  . PARATHYROIDECTOMY    . TUBAL LIGATION      Home Medications: Prior to Admission medications   Medication Sig Start Date End Date Taking? Authorizing Provider  ACIDOPHILUS LACTOBACILLUS PO Take 1 tablet by mouth daily.    [provider]  amoxicillin-clavulanate (AUGMENTIN) 875-125 MG tablet Take 1 tablet by mouth 2 (two) times daily for 10 days. 08/04/17 08/14/17  Darel Hong, MD  aspirin 81 MG EC tablet Take 81 mg by mouth daily.     [provider]  Cholecalciferol (VITAMIN D3) 1000 UNITS CAPS Take 1,000 Units by mouth daily.     [provider]  Coenzyme Q10 100 MG capsule Take 100 mg by mouth daily.     [provider]  HYDROcodone-acetaminophen (NORCO) 5-325 MG tablet Take 1 tablet by mouth every 6 (six) hours as needed for up to 9 doses for severe pain. 08/04/17   Darel Hong, MD  IRON PO Take 325 mg by mouth daily.     [provider]  Astrid Drafts 350 MG CAPS Take 1 capsule by mouth daily.    [provider]  metoprolol tartrate (LOPRESSOR) 25 MG  tablet Take 0.5 tablets (12.5 mg total) by mouth 2 (two) times daily. 05/13/17   Minna Merritts, MD  Multiple Vitamins-Minerals (PRESERVISION AREDS 2) CAPS Take 1 capsule by mouth 2 (two) times daily.    [provider]  ondansetron (ZOFRAN ODT) 4 MG disintegrating tablet Take 1 tablet (4 mg total) by mouth every 8 (eight) hours as needed for nausea or vomiting. 08/04/17   Darel Hong, MD  pantoprazole (PROTONIX) 40 MG tablet TAKE ONE (1) TABLET BY MOUTH EVERY DAY 07/12/17   Burnard Hawthorne, FNP  Probiotic Product (RISA-BID PROBIOTIC PO) Take 250 mg by mouth 2 (two) times daily.     [provider]  rosuvastatin (CRESTOR) 40 MG tablet TAKE ONE (1) TABLET EACH DAY 05/13/17   Minna Merritts, MD   sertraline (ZOLOFT) 50 MG tablet TAKE TWO TABLETS BY MOUTH AT BEDTIME DAILY 07/12/17   Burnard Hawthorne, FNP    Allergies: No Known Allergies  Review of Systems: Review of Systems  Constitutional: Negative for chills and fever.  Respiratory: Negative for shortness of breath.   Cardiovascular: Negative for chest pain.  Gastrointestinal: Negative for abdominal pain, nausea and vomiting.  Musculoskeletal: Negative for back pain.    Physical Exam BP 124/71   Pulse 74   Temp 97.7 F (36.5 C) (Oral)   Ht 5' 1.5" (1.562 m)   Wt 139 lb 3.2 oz (63.1 kg)   BMI 25.88 kg/m  CONSTITUTIONAL: No acute distress RESPIRATORY:  Lungs are clear, and breath sounds are equal bilaterally. Normal respiratory effort without pathologic use of accessory muscles. CARDIOVASCULAR: Heart is regular without murmurs, gallops, or rubs. GI: The abdomen is soft, nondistended, nontender to palpation.  Negative Murphy's sign.   NEUROLOGIC:  Motor and sensation is grossly normal.  Cranial nerves are grossly intact. PSYCH:  Alert and oriented to person, place and time. Affect is normal.  Labs/Imaging: None since ED visit  Assessment and Plan: This is a 72 y.o. female who presents for follow up of possible cholecystitis.  Very reassured that the patient's symptoms have resolved.  Discussed with the patient the option for surgical management with laparoscopic cholecystectomy as well as non-operative management with low fat diet and watchful waiting.  She is not interested in any surgery at this time and would rather do watchful waiting.  She will continue a low fat diet and will monitor her symptoms.  She is aware that if any worse pain, nausea, vomiting, or other issues, she is to call us or come to the hospital for further evaluation.  Patient understands this plan and all of her questions have been answered.  Face-to-face time spent with the patient and care providers was 40 minutes, with more than 50% of the  time spent counseling, educating, and coordinating care of the patient.     Melvyn Neth, [place]

## 2017-08-09 NOTE — Telephone Encounter (Signed)
Patient called confirming appointment today.

## 2017-08-09 NOTE — Telephone Encounter (Signed)
Patient called 2:30

## 2017-09-16 ENCOUNTER — Encounter (INDEPENDENT_AMBULATORY_CARE_PROVIDER_SITE_OTHER): Payer: Medicare HMO | Admitting: Ophthalmology

## 2017-09-16 DIAGNOSIS — I10 Essential (primary) hypertension: Secondary | ICD-10-CM | POA: Diagnosis not present

## 2017-09-16 DIAGNOSIS — H35371 Puckering of macula, right eye: Secondary | ICD-10-CM | POA: Diagnosis not present

## 2017-09-16 DIAGNOSIS — H353221 Exudative age-related macular degeneration, left eye, with active choroidal neovascularization: Secondary | ICD-10-CM

## 2017-09-16 DIAGNOSIS — H35033 Hypertensive retinopathy, bilateral: Secondary | ICD-10-CM | POA: Diagnosis not present

## 2017-09-16 DIAGNOSIS — H353112 Nonexudative age-related macular degeneration, right eye, intermediate dry stage: Secondary | ICD-10-CM

## 2017-09-16 DIAGNOSIS — H43813 Vitreous degeneration, bilateral: Secondary | ICD-10-CM

## 2017-09-21 DIAGNOSIS — M5136 Other intervertebral disc degeneration, lumbar region: Secondary | ICD-10-CM | POA: Diagnosis not present

## 2017-09-21 DIAGNOSIS — M7062 Trochanteric bursitis, left hip: Secondary | ICD-10-CM | POA: Diagnosis not present

## 2017-12-05 IMAGING — US US ABDOMEN LIMITED
1 series · 14 of 25 positions shown · non-contrast
Comparison: Chest CT including portions of upper abdomen [DATE]

CLINICAL DATA: Abdominal pain

EXAM:
ULTRASOUND ABDOMEN LIMITED RIGHT UPPER QUADRANT

[Series 1: us abdomen limited · 0.19mm/px · 14 of 43 slices shown]
[im 1/43]
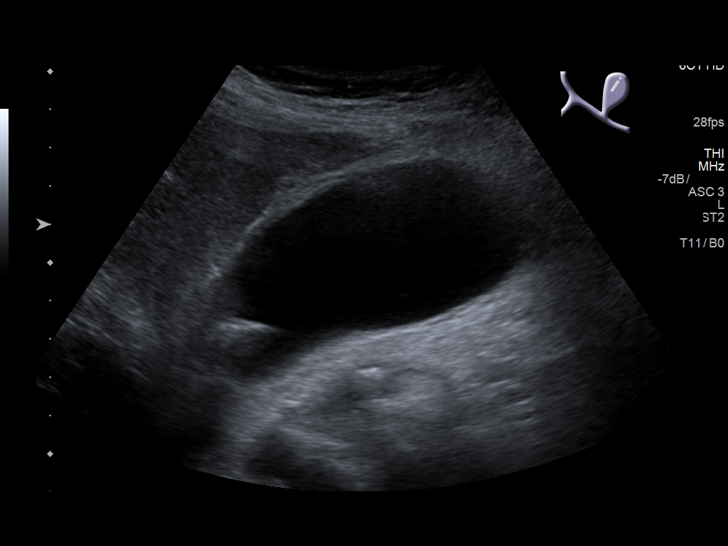
[im 4/43]
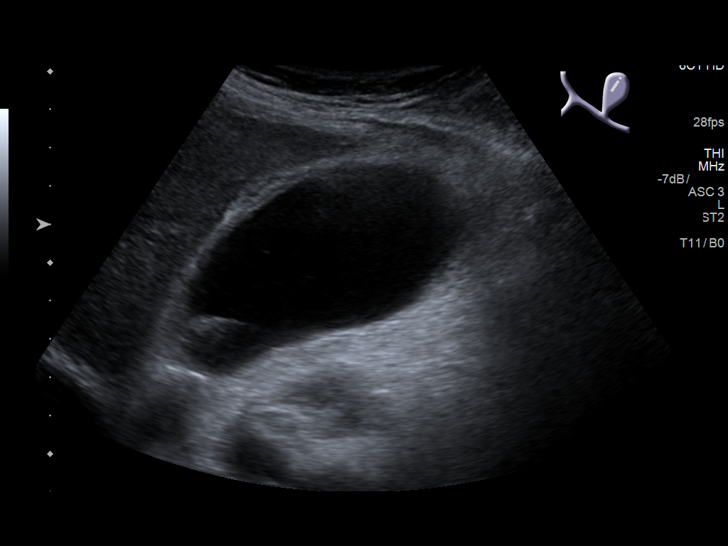
[im 8/43]
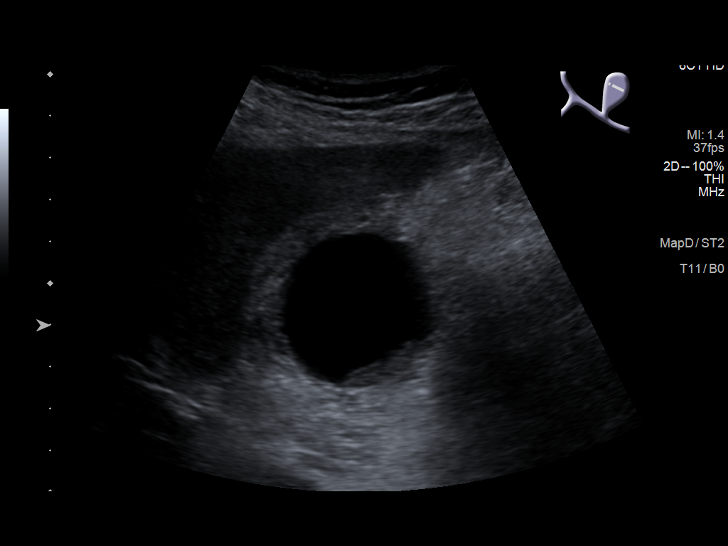
[im 11/43]
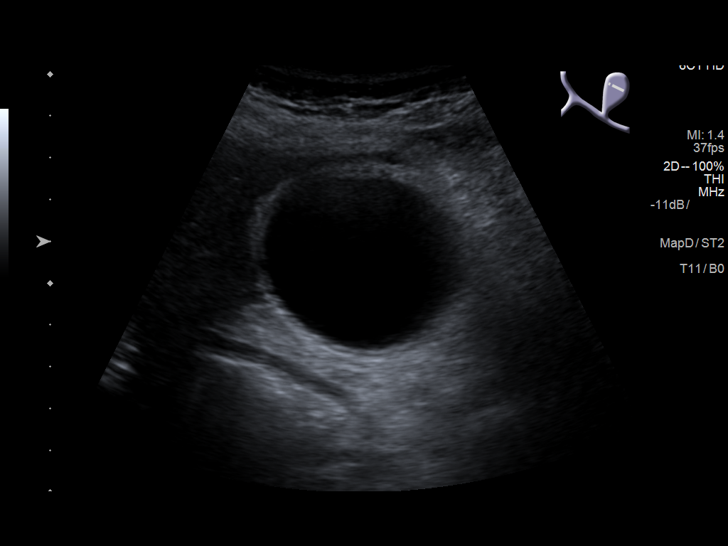
[im 15/43]
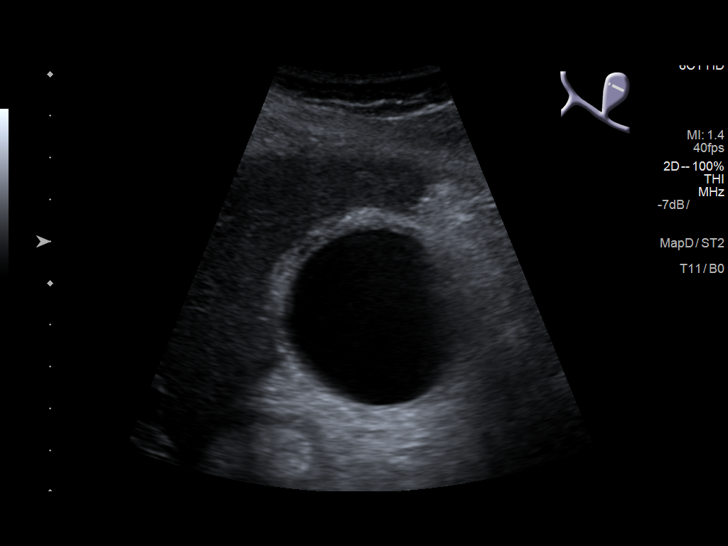
[im 16/43]
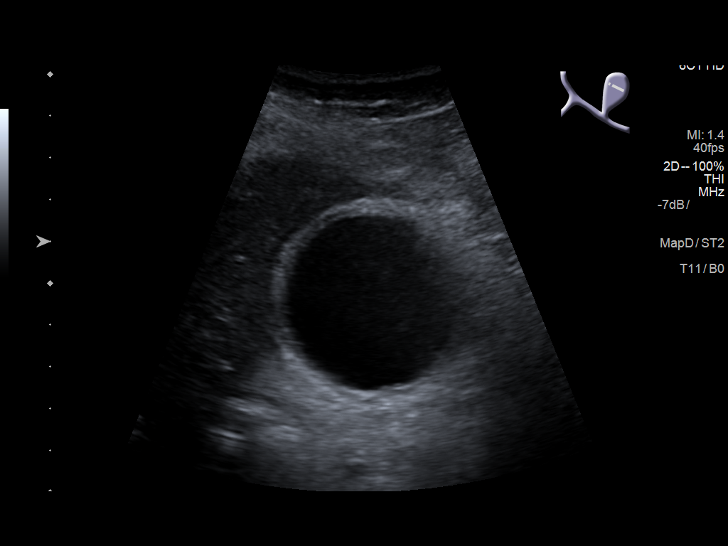
[im 20/43]
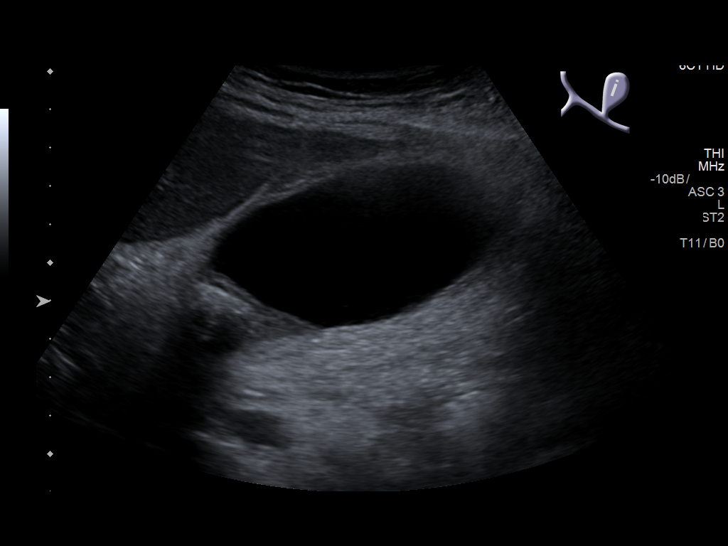
[im 23/43]
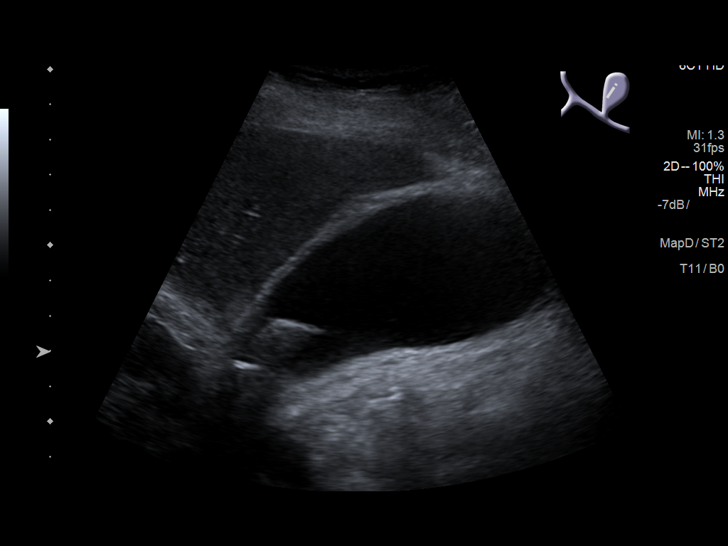
[im 27/43]
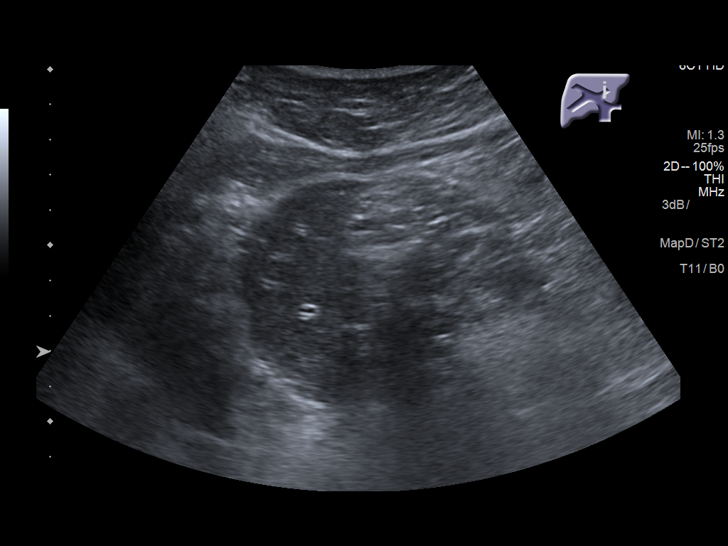
[im 29/43]
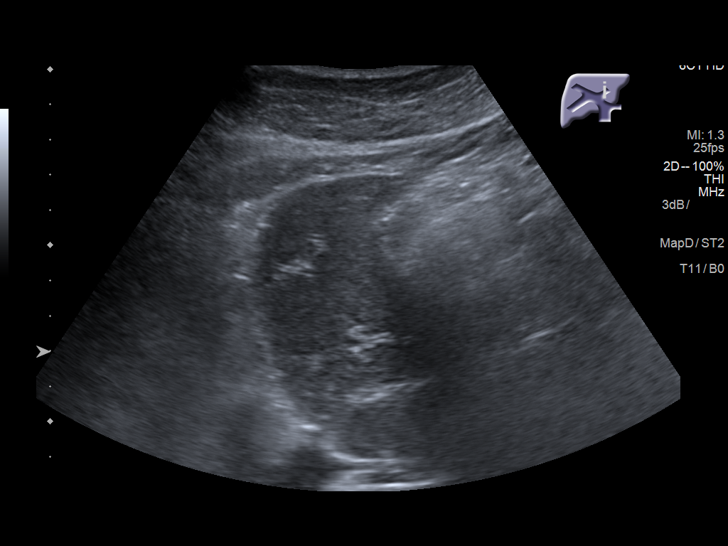
[im 32/43]
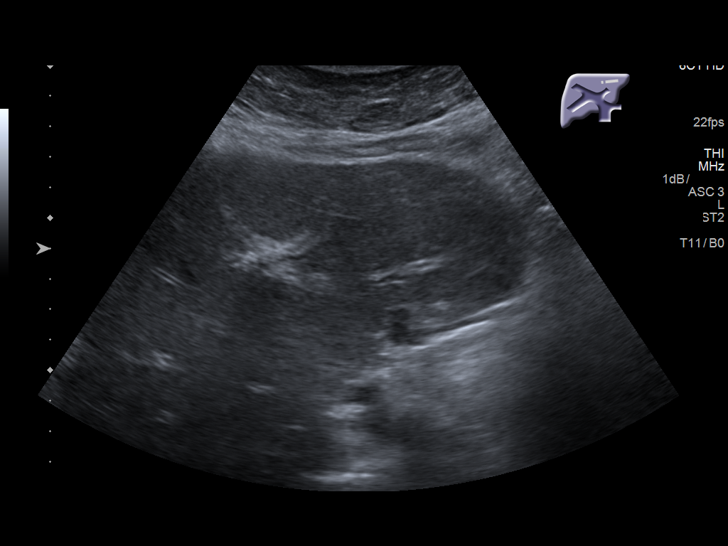
[im 36/43]
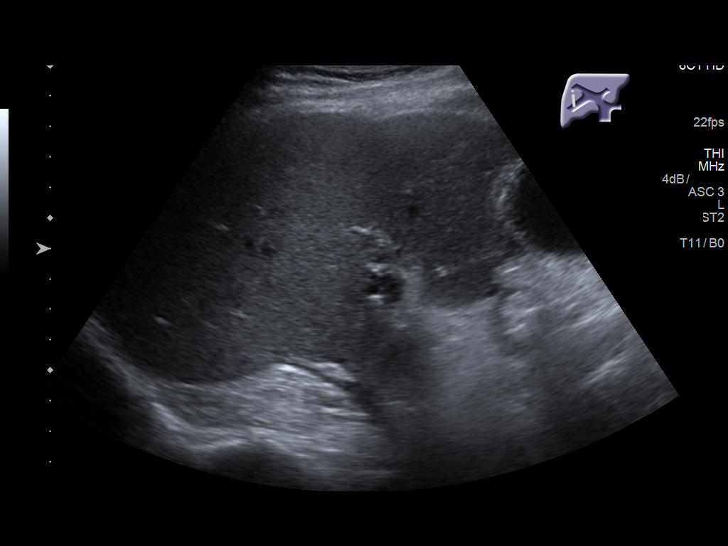
[im 39/43]
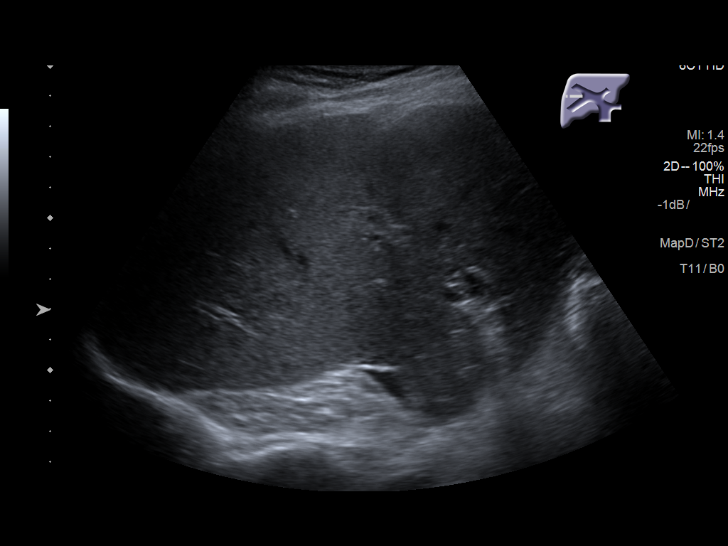
[im 43/43]
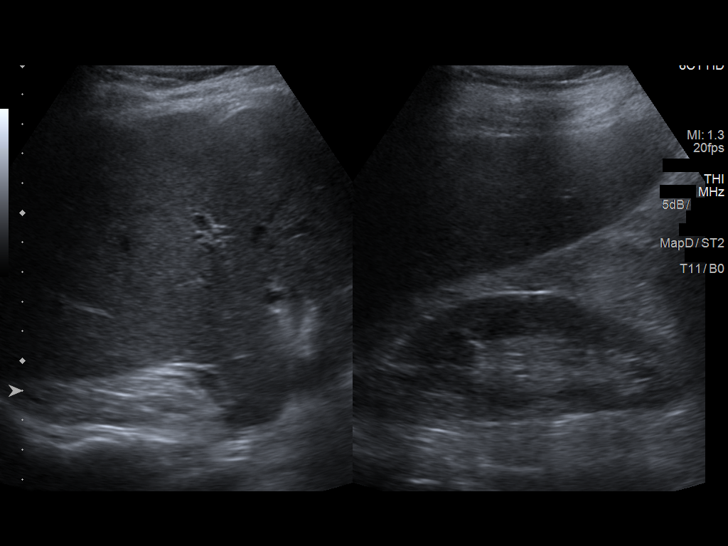

[14 of 25 positions shown; findings below may reference images not displayed]

FINDINGS: Gallbladder:

Within the gallbladder, there is an echogenic focus which moves and
shadows measuring 1.6 cm in length consistent with cholelithiasis.
The gallbladder wall is thickened and edematous. There is trace
pericholecystic fluid. No sonographic Murphy sign noted by
sonographer.

Common bile duct:

Diameter: 5 mm. No intrahepatic or extrahepatic biliary duct
dilatation.

Liver:

No focal lesion identified. Within normal limits in parenchymal
echogenicity. Portal vein is patent on color Doppler imaging with
normal direction of blood flow towards the liver.
IMPRESSION: Cholelithiasis with thickened edematous gallbladder wall and trace
pericholecystic fluid. These are findings felt to be indicative
acute cholecystitis. Study otherwise unremarkable.

## 2017-12-23 ENCOUNTER — Encounter (INDEPENDENT_AMBULATORY_CARE_PROVIDER_SITE_OTHER): Payer: Medicare HMO | Admitting: Ophthalmology

## 2017-12-23 DIAGNOSIS — I10 Essential (primary) hypertension: Secondary | ICD-10-CM | POA: Diagnosis not present

## 2017-12-23 DIAGNOSIS — H43813 Vitreous degeneration, bilateral: Secondary | ICD-10-CM | POA: Diagnosis not present

## 2017-12-23 DIAGNOSIS — H35373 Puckering of macula, bilateral: Secondary | ICD-10-CM | POA: Diagnosis not present

## 2017-12-23 DIAGNOSIS — H35033 Hypertensive retinopathy, bilateral: Secondary | ICD-10-CM

## 2017-12-23 DIAGNOSIS — H353221 Exudative age-related macular degeneration, left eye, with active choroidal neovascularization: Secondary | ICD-10-CM

## 2017-12-31 ENCOUNTER — Telehealth: Payer: Self-pay | Admitting: Cardiovascular Disease

## 2017-12-31 DIAGNOSIS — E782 Mixed hyperlipidemia: Secondary | ICD-10-CM

## 2017-12-31 NOTE — Telephone Encounter (Signed)
Patient wants to know if she needs lipid and liver prior to 10/7 appt with [facility].  If so please call she wants to pick up orders and take to [facility]

## 2017-12-31 NOTE — Telephone Encounter (Signed)
To Dr. Rockey Situ to review. If you would like her to have labs, what all would you like her to have?  Thanks!

## 2018-01-02 NOTE — Telephone Encounter (Signed)
Yes, we can order fasting lipids and liver

## 2018-01-03 NOTE — Telephone Encounter (Signed)
Patient called and fasting lab work has been ordered. The orders have been placed at the front desk for her per her request.

## 2018-01-12 ENCOUNTER — Other Ambulatory Visit: Payer: Self-pay | Admitting: Cardiovascular Disease

## 2018-01-12 NOTE — Telephone Encounter (Signed)
°*  STAT* If patient is at the pharmacy, call can be transferred to refill team.   1. Which medications need to be refilled? (please list name of each medication and dose if known)  Metoprolol 12.5 mg po BID   2. Which pharmacy/location (including street and city if local pharmacy) is medication to be sent to? [facility]    3. Do they need a 30 day or 90 day supply? [place]

## 2018-01-18 DIAGNOSIS — E782 Mixed hyperlipidemia: Secondary | ICD-10-CM | POA: Diagnosis not present

## 2018-01-19 LAB — LIPID PANEL
CHOL/HDL RATIO: 1.9 ratio (ref 0.0–4.4)
Cholesterol, Total: 151 mg/dL (ref 100–199)
HDL: 78 mg/dL (ref >39)
LDL Calculated: 61 mg/dL (ref 0–99)
Triglycerides: 60 mg/dL (ref 0–149)
VLDL Cholesterol Cal: 12 mg/dL (ref 5–40)

## 2018-01-19 LAB — HEPATIC FUNCTION PANEL
ALK PHOS: 76 IU/L (ref 39–117)
ALT: 28 IU/L (ref 0–32)
AST: 30 IU/L (ref 0–40)
Albumin: 4.5 g/dL (ref 3.6–4.8)
BILIRUBIN, DIRECT: 0.13 mg/dL (ref 0.00–0.40)
Bilirubin Total: 0.4 mg/dL (ref 0.0–1.2)
TOTAL PROTEIN: 6.3 g/dL (ref 6.0–8.5)

## 2018-01-23 DIAGNOSIS — I2510 Atherosclerotic heart disease of native coronary artery without angina pectoris: Secondary | ICD-10-CM | POA: Insufficient documentation

## 2018-01-23 NOTE — Progress Notes (Signed)
Cardiology Office Note  Date:  01/24/2018   ID:  KATARZYNA WOLVEN, DOB 1945-10-29, MRN [id number]  PCP:  Burnard Hawthorne, FNP   Chief Complaint  Patient presents with  . other    6 month follow up. Meds reviewed by the pt. verbally. "doing well."     HPI:  Ms. Gorelick is a pleasant 72 year old woman with a history of  coronary artery disease, stent to her left circumflex and 2 stents to the LAD in March of 2011, aorto femoral bypass grafting in early October 2011,  with occlusion of her coronary stents in mid-October requiring bypass surgery at [facility] in mid to late October 2012.  Cath 2013: occluded SVG to PDA thyroid surgery March 2014 long history of smoking though stopped in March of 2011 plastic surgery on her face in the past She presents for routine followup of her coronary artery disease.  No significant chest pain or shortness of breath on exertion Having chronic back pain, recently on prednisone No regular exercise program  Gallbladder attack April 2019 Declined surgery Prior attack 7 years earlier  We reviewed her lab work with her total cholesterol 150 LDL 60 Glucose within normal range She stopped smoking 9 years ago  EKG personally reviewed by myself on todays visit Shows normal sinus rhythm rate 76 bpm PVCs nonspecific ST abnormality  Other past medical history reviewed Last stress test 2013 Cardiac catheterization October 2013  Right foot fracture 3 breaks, extensive rehab   History of Macular degeneration, getting eye shots Had cataract surg, now eyes are doing well  Chronic back pain Previously buying products from [employer] for weight loss   cardiac catheterization October 2013 showed LIMA to the LAD, vein graft to the left circumflex, vein graft to the PDA which is occluded that supplies a very small area. Left main has 50% disease, ostial LAD has 95% in-stent restenosis, mid LAD has 100% disease, 75% ostial circumflex, 100% mid circumflex, very  small OM1, 30% RCA disease, ejection fraction 45%  Stress test shows suboptimal images due to GI activity on resting images, significant anterior wall ischemia in the mid to distal segments, reduced ejection fraction of 47%  Prior ventricular arrhythmia when her stents were placed in the Cath Lab requiring shock.  echocardiogram improved, EF greater than 40%.  Cardiac catheter report from July 15 2009 indicate she had a vision bare metal stent 2.75 x 28 mm in the LCX, mini vision bare metal stent 2.5 x 28 and 2.5 x 15 mm stent to the LAD.  DJD in her neck and back  PMH:   has a past medical history of Abnormal Pap smear of cervix, Coronary artery disease, Degenerative disc disease, lumbar, Hyperlipidemia, Hypertension, Macular degeneration, Parathyroid adenoma, and PVD (peripheral vascular disease) ([place]).  PSH:    Past Surgical History:  Procedure Laterality Date  . CARDIAC CATHETERIZATION  2013   [facility]  . CATARACT EXTRACTION    . COLPOSCOPY    . CORONARY ARTERY BYPASS GRAFT  02/07/2010   x 3  . FACIAL COSMETIC SURGERY  2013   Dr. Nadeen Landau  . FOOT SURGERY     right  . ILIO-FEMORAL BYPASS GRAFT  2011  . MITRAL VALVE REPAIR  01/2010  . ORIF ANKLE FRACTURE Right 10/08/2016   Procedure: OPEN REDUCTION INTERNAL FIXATION (ORIF) ANKLE FRACTURE;  Surgeon: Corky Mull, MD;  Location: [facility] ORS;  Service: Orthopedics;  Laterality: Right;  . PARATHYROIDECTOMY    . TUBAL LIGATION  Current Outpatient Medications  Medication Sig Dispense Refill  . ACIDOPHILUS LACTOBACILLUS PO Take 1 tablet by mouth daily.    Marland Kitchen aspirin 81 MG EC tablet Take 81 mg by mouth daily.     . Cholecalciferol (VITAMIN D3) 1000 UNITS CAPS Take 1,000 Units by mouth daily.     . Coenzyme Q10 100 MG capsule Take 100 mg by mouth daily.     Marland Kitchen HYDROcodone-acetaminophen (NORCO) 5-325 MG tablet Take 1 tablet by mouth every 6 (six) hours as needed for up to 9 doses for severe pain. 9 tablet 0  . IRON PO Take  325 mg by mouth daily.     [name] Oil 350 MG CAPS Take 1 capsule by mouth daily.    . metoprolol tartrate (LOPRESSOR) 25 MG tablet TAKE 1/2 TABLET BY MOUTH TWICE DAILY 90 tablet 3  . Multiple Vitamins-Minerals (PRESERVISION AREDS 2) CAPS Take 1 capsule by mouth 2 (two) times daily.    . pantoprazole (PROTONIX) 40 MG tablet TAKE ONE (1) TABLET BY MOUTH EVERY DAY 90 tablet 4  . predniSONE (DELTASONE) 5 MG tablet prednisone 5 mg tablet    . Probiotic Product (RISA-BID PROBIOTIC PO) Take 250 mg by mouth 2 (two) times daily.     . rosuvastatin (CRESTOR) 40 MG tablet TAKE ONE (1) TABLET EACH DAY 90 tablet 3  . sertraline (ZOLOFT) 50 MG tablet TAKE TWO TABLETS BY MOUTH AT BEDTIME DAILY 180 tablet 1  . ezetimibe (ZETIA) 10 MG tablet Take 1 tablet (10 mg total) by mouth daily. 90 tablet 3   No current facility-administered medications for this visit.      Allergies:   Patient has no known allergies.   Social History:  The patient  reports that she quit smoking about 8 years ago. Her smoking use included cigarettes. She has a 40.00 pack-year smoking history. She has never used smokeless tobacco. She reports that she drinks about 1.0 standard drinks of alcohol per week. She reports that she does not use drugs.   Family History:   family history includes Heart disease in her father.    Review of Systems: Review of Systems  Constitutional: Negative.   Respiratory: Negative.   Cardiovascular: Negative.   Gastrointestinal: Negative.   Musculoskeletal: Positive for back pain.  Neurological: Negative.   Psychiatric/Behavioral: Negative.   All other systems reviewed and are negative.    PHYSICAL EXAM: VS:  BP 140/80 (BP Location: Left Arm, Patient Position: Sitting, Cuff Size: Normal)   Pulse 76   Ht 5' (1.524 m)   Wt 145 lb 4 oz (65.9 kg)   BMI 28.37 kg/m  , BMI Body mass index is 28.37 kg/m. Constitutional:  oriented to person, place, and time. No distress.  HENT:  Head: Normocephalic  and atraumatic.  Eyes:  no discharge. No scleral icterus.  Neck: Normal range of motion. Neck supple. No JVD present.  Cardiovascular: Normal rate, regular rhythm, normal heart sounds and intact distal pulses. Exam reveals no gallop and no friction rub. No edema No murmur heard. Pulmonary/Chest: Effort normal and breath sounds normal. No stridor. No respiratory distress.  no wheezes.  no rales.  no tenderness.  Abdominal: Soft.  no distension.  no tenderness.  Musculoskeletal: Normal range of motion.  no  tenderness or deformity.  Neurological:  normal muscle tone. Coordination normal. No atrophy Skin: Skin is warm and dry. No rash noted. not diaphoretic.  Psychiatric:  normal mood and affect. behavior is normal. Thought content normal.  Recent Labs: 03/25/2017: TSH 0.948 08/03/2017: BUN 10; Creatinine, Ser 0.68; Hemoglobin 13.6; Platelets 229; Potassium 4.2; Sodium 128 01/18/2018: ALT 28    Lipid Panel Lab Results  Component Value Date   CHOL 151 01/18/2018   HDL 78 01/18/2018   LDLCALC 61 01/18/2018   TRIG 60 01/18/2018      Wt Readings from Last 3 Encounters:  01/24/18 145 lb 4 oz (65.9 kg)  08/09/17 139 lb 3.2 oz (63.1 kg)  08/03/17 138 lb (62.6 kg)       ASSESSMENT AND PLAN:  Mixed hyperlipidemia - Plan: EKG 12-Lead Long discussion concerning her cholesterol and new goals Discussed leaving medications alone or trying to push her numbers lower I recommended she add Zetia to her Crestor  Coronary atherosclerosis of autologous vein bypass graft without angina - Plan: EKG 12-Lead Currently with no symptoms of angina. No further workup at this time. Continue current medication regimen.  Stable Strongly recommended she start a exercise program  PVD (peripheral vascular disease) ([place]) - Plan: EKG 12-Lead Stable, no sx of claudication Not walking very much, recommended walking program Will push for more aggressive cholesterol  Essential hypertension, benign - Plan: EKG  12-Lead Blood pressure on the high side today, she will watch it at home Recommend she call us if numbers run high  Class 1 obesity due to excess calories without serious comorbidity with body mass index (BMI) of 33.0 to 33.9 in adult Difficulty losing weight for the past several years Exacerbated by prednisone Recommended regular walking program   Total encounter time more than 25 minutes  Greater than 50% was spent in counseling and coordination of care with the patient   Disposition:   F/U  12 months   Orders Placed This Encounter  Procedures  . EKG 12-Lead     Signed, Esmond Plants, M.D., Ph.D. 01/24/2018  [place], [place]

## 2018-01-24 ENCOUNTER — Encounter: Payer: Self-pay | Admitting: Cardiovascular Disease

## 2018-01-24 ENCOUNTER — Ambulatory Visit: Payer: Medicare HMO | Admitting: Cardiovascular Disease

## 2018-01-24 VITALS — BP 140/80 | HR 76 | Ht 60.0 in | Wt 145.2 lb

## 2018-01-24 DIAGNOSIS — I739 Peripheral vascular disease, unspecified: Secondary | ICD-10-CM

## 2018-01-24 DIAGNOSIS — Z87891 Personal history of nicotine dependence: Secondary | ICD-10-CM

## 2018-01-24 DIAGNOSIS — I10 Essential (primary) hypertension: Secondary | ICD-10-CM

## 2018-01-24 DIAGNOSIS — E782 Mixed hyperlipidemia: Secondary | ICD-10-CM

## 2018-01-24 DIAGNOSIS — I25718 Atherosclerosis of autologous vein coronary artery bypass graft(s) with other forms of angina pectoris: Secondary | ICD-10-CM

## 2018-01-24 DIAGNOSIS — I25118 Atherosclerotic heart disease of native coronary artery with other forms of angina pectoris: Secondary | ICD-10-CM

## 2018-01-24 MED ORDER — EZETIMIBE 10 MG PO TABS: 10.0000 mg | ORAL_TABLET | Freq: Every day | ORAL | 3 refills | Status: DC

## 2018-01-24 NOTE — Patient Instructions (Addendum)
Medication Instructions:   Please add zetia 10 mg daily  Labwork:  No new labs needed  Testing/Procedures:  No further testing at this time   Follow-Up: It was a pleasure seeing you in the office today. Please call us if you have new issues that need to be addressed before your next appt.  [phone]  Your physician wants you to follow-up in: 12 months.  You will receive a reminder letter in the mail two months in advance. If you don't receive a letter, please call our office to schedule the follow-up appointment.  If you need a refill on your cardiac medications before your next appointment, please call your pharmacy.  For educational health videos Log in to : [email or web address] Or : [email or web address], password : [email or web address]

## 2018-01-27 ENCOUNTER — Ambulatory Visit: Payer: Medicare HMO | Admitting: Cardiovascular Disease

## 2018-02-22 ENCOUNTER — Other Ambulatory Visit: Payer: Self-pay | Admitting: Cardiovascular Disease

## 2018-02-25 DIAGNOSIS — M955 Acquired deformity of pelvis: Secondary | ICD-10-CM | POA: Diagnosis not present

## 2018-02-25 DIAGNOSIS — M4306 Spondylolysis, lumbar region: Secondary | ICD-10-CM | POA: Diagnosis not present

## 2018-02-25 DIAGNOSIS — M9903 Segmental and somatic dysfunction of lumbar region: Secondary | ICD-10-CM | POA: Diagnosis not present

## 2018-02-25 DIAGNOSIS — M9905 Segmental and somatic dysfunction of pelvic region: Secondary | ICD-10-CM | POA: Diagnosis not present

## 2018-02-26 ENCOUNTER — Encounter: Payer: Self-pay | Admitting: Family

## 2018-02-28 DIAGNOSIS — M9905 Segmental and somatic dysfunction of pelvic region: Secondary | ICD-10-CM | POA: Diagnosis not present

## 2018-02-28 DIAGNOSIS — M4306 Spondylolysis, lumbar region: Secondary | ICD-10-CM | POA: Diagnosis not present

## 2018-02-28 DIAGNOSIS — M955 Acquired deformity of pelvis: Secondary | ICD-10-CM | POA: Diagnosis not present

## 2018-02-28 DIAGNOSIS — M9903 Segmental and somatic dysfunction of lumbar region: Secondary | ICD-10-CM | POA: Diagnosis not present

## 2018-03-01 NOTE — Telephone Encounter (Signed)
Left message for patient to call and schedule.

## 2018-03-02 DIAGNOSIS — M9903 Segmental and somatic dysfunction of lumbar region: Secondary | ICD-10-CM | POA: Diagnosis not present

## 2018-03-02 DIAGNOSIS — M955 Acquired deformity of pelvis: Secondary | ICD-10-CM | POA: Diagnosis not present

## 2018-03-02 DIAGNOSIS — M4306 Spondylolysis, lumbar region: Secondary | ICD-10-CM | POA: Diagnosis not present

## 2018-03-02 DIAGNOSIS — M9905 Segmental and somatic dysfunction of pelvic region: Secondary | ICD-10-CM | POA: Diagnosis not present

## 2018-03-03 ENCOUNTER — Encounter (INDEPENDENT_AMBULATORY_CARE_PROVIDER_SITE_OTHER): Payer: Medicare HMO | Admitting: Ophthalmology

## 2018-03-03 DIAGNOSIS — H353221 Exudative age-related macular degeneration, left eye, with active choroidal neovascularization: Secondary | ICD-10-CM | POA: Diagnosis not present

## 2018-03-03 DIAGNOSIS — I10 Essential (primary) hypertension: Secondary | ICD-10-CM

## 2018-03-03 DIAGNOSIS — H43813 Vitreous degeneration, bilateral: Secondary | ICD-10-CM | POA: Diagnosis not present

## 2018-03-03 DIAGNOSIS — H353112 Nonexudative age-related macular degeneration, right eye, intermediate dry stage: Secondary | ICD-10-CM

## 2018-03-03 DIAGNOSIS — H35033 Hypertensive retinopathy, bilateral: Secondary | ICD-10-CM

## 2018-03-04 DIAGNOSIS — M9903 Segmental and somatic dysfunction of lumbar region: Secondary | ICD-10-CM | POA: Diagnosis not present

## 2018-03-04 DIAGNOSIS — M4306 Spondylolysis, lumbar region: Secondary | ICD-10-CM | POA: Diagnosis not present

## 2018-03-04 DIAGNOSIS — M9905 Segmental and somatic dysfunction of pelvic region: Secondary | ICD-10-CM | POA: Diagnosis not present

## 2018-03-04 DIAGNOSIS — M955 Acquired deformity of pelvis: Secondary | ICD-10-CM | POA: Diagnosis not present

## 2018-03-07 DIAGNOSIS — M9903 Segmental and somatic dysfunction of lumbar region: Secondary | ICD-10-CM | POA: Diagnosis not present

## 2018-03-07 DIAGNOSIS — M9905 Segmental and somatic dysfunction of pelvic region: Secondary | ICD-10-CM | POA: Diagnosis not present

## 2018-03-07 DIAGNOSIS — M955 Acquired deformity of pelvis: Secondary | ICD-10-CM | POA: Diagnosis not present

## 2018-03-07 DIAGNOSIS — M4306 Spondylolysis, lumbar region: Secondary | ICD-10-CM | POA: Diagnosis not present

## 2018-03-09 DIAGNOSIS — M9903 Segmental and somatic dysfunction of lumbar region: Secondary | ICD-10-CM | POA: Diagnosis not present

## 2018-03-09 DIAGNOSIS — M9905 Segmental and somatic dysfunction of pelvic region: Secondary | ICD-10-CM | POA: Diagnosis not present

## 2018-03-09 DIAGNOSIS — M955 Acquired deformity of pelvis: Secondary | ICD-10-CM | POA: Diagnosis not present

## 2018-03-09 DIAGNOSIS — M4306 Spondylolysis, lumbar region: Secondary | ICD-10-CM | POA: Diagnosis not present

## 2018-03-11 DIAGNOSIS — M9903 Segmental and somatic dysfunction of lumbar region: Secondary | ICD-10-CM | POA: Diagnosis not present

## 2018-03-11 DIAGNOSIS — M4306 Spondylolysis, lumbar region: Secondary | ICD-10-CM | POA: Diagnosis not present

## 2018-03-11 DIAGNOSIS — M9905 Segmental and somatic dysfunction of pelvic region: Secondary | ICD-10-CM | POA: Diagnosis not present

## 2018-03-11 DIAGNOSIS — M955 Acquired deformity of pelvis: Secondary | ICD-10-CM | POA: Diagnosis not present

## 2018-03-14 DIAGNOSIS — M955 Acquired deformity of pelvis: Secondary | ICD-10-CM | POA: Diagnosis not present

## 2018-03-14 DIAGNOSIS — M9905 Segmental and somatic dysfunction of pelvic region: Secondary | ICD-10-CM | POA: Diagnosis not present

## 2018-03-14 DIAGNOSIS — M9903 Segmental and somatic dysfunction of lumbar region: Secondary | ICD-10-CM | POA: Diagnosis not present

## 2018-03-14 DIAGNOSIS — M4306 Spondylolysis, lumbar region: Secondary | ICD-10-CM | POA: Diagnosis not present

## 2018-03-15 ENCOUNTER — Telehealth: Payer: Self-pay | Admitting: Cardiovascular Disease

## 2018-03-15 NOTE — Telephone Encounter (Signed)
Spoke with patient and she states that since starting the zetia she has noticed a increase in her back pain. Reviewed medication and she reports she stopped taking it. Requested that she try 2 weeks off the medication and to see if her symptoms resolve or stay the same. Then instructed her to try every other day after that to see if she can tolerate that better. She was agreeable to try this and will call us back if she is unable to take it at all. Let her know that I would make provider aware as well. She was appreciative for the call with no further questions at this time.

## 2018-03-15 NOTE — Telephone Encounter (Signed)
Pt c/o medication issue:  1. Name of Medication: zetia 10 mg po q day   2. How are you currently taking this medication (dosage and times per day)? Not currently taking   3. Are you having a reaction (difficulty breathing--STAT)? Possible side effect   4. What is your medication issue? Back pain maybe zetia stopped taking 2 days ago

## 2018-03-16 DIAGNOSIS — M9905 Segmental and somatic dysfunction of pelvic region: Secondary | ICD-10-CM | POA: Diagnosis not present

## 2018-03-16 DIAGNOSIS — M9903 Segmental and somatic dysfunction of lumbar region: Secondary | ICD-10-CM | POA: Diagnosis not present

## 2018-03-16 DIAGNOSIS — M4306 Spondylolysis, lumbar region: Secondary | ICD-10-CM | POA: Diagnosis not present

## 2018-03-16 DIAGNOSIS — M955 Acquired deformity of pelvis: Secondary | ICD-10-CM | POA: Diagnosis not present

## 2018-03-21 DIAGNOSIS — H0019 Chalazion unspecified eye, unspecified eyelid: Secondary | ICD-10-CM | POA: Diagnosis not present

## 2018-03-21 DIAGNOSIS — H02889 Meibomian gland dysfunction of unspecified eye, unspecified eyelid: Secondary | ICD-10-CM | POA: Diagnosis not present

## 2018-03-23 DIAGNOSIS — M9905 Segmental and somatic dysfunction of pelvic region: Secondary | ICD-10-CM | POA: Diagnosis not present

## 2018-03-23 DIAGNOSIS — M955 Acquired deformity of pelvis: Secondary | ICD-10-CM | POA: Diagnosis not present

## 2018-03-23 DIAGNOSIS — M9903 Segmental and somatic dysfunction of lumbar region: Secondary | ICD-10-CM | POA: Diagnosis not present

## 2018-03-23 DIAGNOSIS — M4306 Spondylolysis, lumbar region: Secondary | ICD-10-CM | POA: Diagnosis not present

## 2018-03-25 DIAGNOSIS — M955 Acquired deformity of pelvis: Secondary | ICD-10-CM | POA: Diagnosis not present

## 2018-03-25 DIAGNOSIS — M4306 Spondylolysis, lumbar region: Secondary | ICD-10-CM | POA: Diagnosis not present

## 2018-03-25 DIAGNOSIS — M9903 Segmental and somatic dysfunction of lumbar region: Secondary | ICD-10-CM | POA: Diagnosis not present

## 2018-03-25 DIAGNOSIS — M9905 Segmental and somatic dysfunction of pelvic region: Secondary | ICD-10-CM | POA: Diagnosis not present

## 2018-03-28 DIAGNOSIS — M9903 Segmental and somatic dysfunction of lumbar region: Secondary | ICD-10-CM | POA: Diagnosis not present

## 2018-03-28 DIAGNOSIS — M9905 Segmental and somatic dysfunction of pelvic region: Secondary | ICD-10-CM | POA: Diagnosis not present

## 2018-03-28 DIAGNOSIS — M4306 Spondylolysis, lumbar region: Secondary | ICD-10-CM | POA: Diagnosis not present

## 2018-03-28 DIAGNOSIS — M955 Acquired deformity of pelvis: Secondary | ICD-10-CM | POA: Diagnosis not present

## 2018-03-30 DIAGNOSIS — M9903 Segmental and somatic dysfunction of lumbar region: Secondary | ICD-10-CM | POA: Diagnosis not present

## 2018-03-30 DIAGNOSIS — M9905 Segmental and somatic dysfunction of pelvic region: Secondary | ICD-10-CM | POA: Diagnosis not present

## 2018-03-30 DIAGNOSIS — M955 Acquired deformity of pelvis: Secondary | ICD-10-CM | POA: Diagnosis not present

## 2018-03-30 DIAGNOSIS — M4306 Spondylolysis, lumbar region: Secondary | ICD-10-CM | POA: Diagnosis not present

## 2018-04-04 ENCOUNTER — Encounter: Payer: Self-pay | Admitting: Family

## 2018-04-04 DIAGNOSIS — M4306 Spondylolysis, lumbar region: Secondary | ICD-10-CM | POA: Diagnosis not present

## 2018-04-04 DIAGNOSIS — M955 Acquired deformity of pelvis: Secondary | ICD-10-CM | POA: Diagnosis not present

## 2018-04-04 DIAGNOSIS — M9903 Segmental and somatic dysfunction of lumbar region: Secondary | ICD-10-CM | POA: Diagnosis not present

## 2018-04-04 DIAGNOSIS — M9905 Segmental and somatic dysfunction of pelvic region: Secondary | ICD-10-CM | POA: Diagnosis not present

## 2018-04-04 DIAGNOSIS — Z1231 Encounter for screening mammogram for malignant neoplasm of breast: Secondary | ICD-10-CM | POA: Diagnosis not present

## 2018-04-11 DIAGNOSIS — M4306 Spondylolysis, lumbar region: Secondary | ICD-10-CM | POA: Diagnosis not present

## 2018-04-11 DIAGNOSIS — M9905 Segmental and somatic dysfunction of pelvic region: Secondary | ICD-10-CM | POA: Diagnosis not present

## 2018-04-11 DIAGNOSIS — M9903 Segmental and somatic dysfunction of lumbar region: Secondary | ICD-10-CM | POA: Diagnosis not present

## 2018-04-11 DIAGNOSIS — M955 Acquired deformity of pelvis: Secondary | ICD-10-CM | POA: Diagnosis not present

## 2018-04-15 ENCOUNTER — Ambulatory Visit (INDEPENDENT_AMBULATORY_CARE_PROVIDER_SITE_OTHER): Payer: Medicare HMO | Admitting: Family

## 2018-04-15 ENCOUNTER — Encounter: Payer: Self-pay | Admitting: Family

## 2018-04-15 VITALS — BP 128/64 | HR 82 | Temp 98.6°F | Ht 60.0 in | Wt 145.4 lb

## 2018-04-15 DIAGNOSIS — K219 Gastro-esophageal reflux disease without esophagitis: Secondary | ICD-10-CM | POA: Diagnosis not present

## 2018-04-15 DIAGNOSIS — F411 Generalized anxiety disorder: Secondary | ICD-10-CM

## 2018-04-15 DIAGNOSIS — I10 Essential (primary) hypertension: Secondary | ICD-10-CM

## 2018-04-15 DIAGNOSIS — Z0001 Encounter for general adult medical examination with abnormal findings: Secondary | ICD-10-CM | POA: Diagnosis not present

## 2018-04-15 DIAGNOSIS — R69 Illness, unspecified: Secondary | ICD-10-CM | POA: Diagnosis not present

## 2018-04-15 DIAGNOSIS — I2510 Atherosclerotic heart disease of native coronary artery without angina pectoris: Secondary | ICD-10-CM

## 2018-04-15 LAB — LIPID PANEL
CHOLESTEROL: 136 mg/dL (ref 0–200)
HDL: 70.9 mg/dL (ref >39.00)
LDL Cholesterol: 51 mg/dL (ref 0–99)
NonHDL: 65.37
TRIGLYCERIDES: 71 mg/dL (ref 0.0–149.0)
Total CHOL/HDL Ratio: 2
VLDL: 14.2 mg/dL (ref 0.0–40.0)

## 2018-04-15 LAB — CBC
HCT: 43.1 % (ref 36.0–46.0)
Hemoglobin: 14.6 g/dL (ref 12.0–15.0)
MCHC: 33.9 g/dL (ref 30.0–36.0)
MCV: 95.1 fl (ref 78.0–100.0)
Platelets: 231 10*3/uL (ref 150.0–400.0)
RBC: 4.53 Mil/uL (ref 3.87–5.11)
RDW: 13.8 % (ref 11.5–15.5)
WBC: 7.3 10*3/uL (ref 4.0–10.5)

## 2018-04-15 LAB — COMPREHENSIVE METABOLIC PANEL
ALBUMIN: 4.4 g/dL (ref 3.5–5.2)
ALK PHOS: 78 U/L (ref 39–117)
ALT: 65 U/L — AB (ref 0–35)
AST: 52 U/L — AB (ref 0–37)
BUN: 13 mg/dL (ref 6–23)
CHLORIDE: 102 meq/L (ref 96–112)
CO2: 25 mEq/L (ref 19–32)
Calcium: 10.2 mg/dL (ref 8.4–10.5)
Creatinine, Ser: 0.79 mg/dL (ref 0.40–1.20)
GFR: 76.42 mL/min (ref >60.00)
Glucose, Bld: 90 mg/dL (ref 70–99)
Potassium: 4.4 mEq/L (ref 3.5–5.1)
SODIUM: 135 meq/L (ref 135–145)
TOTAL PROTEIN: 6.7 g/dL (ref 6.0–8.3)
Total Bilirubin: 0.4 mg/dL (ref 0.2–1.2)

## 2018-04-15 LAB — VITAMIN D 25 HYDROXY (VIT D DEFICIENCY, FRACTURES): VITD: 36.26 ng/mL (ref 30.00–100.00)

## 2018-04-15 LAB — TSH: TSH: 1.9 u[IU]/mL (ref 0.35–4.50)

## 2018-04-15 LAB — HEMOGLOBIN A1C: HEMOGLOBIN A1C: 5.7 % (ref 4.6–6.5)

## 2018-04-15 MED ORDER — SERTRALINE HCL 50 MG PO TABS: 50.0000 mg | ORAL_TABLET | Freq: Every day | ORAL | 1 refills | Status: DC

## 2018-04-15 NOTE — Assessment & Plan Note (Signed)
Doing well on Zoloft 50 mg.  Will continue.

## 2018-04-15 NOTE — Assessment & Plan Note (Signed)
No red flag symptoms today.  Education advised on long-term use of PPIs.  Politely declines referral to gastroenterology.

## 2018-04-15 NOTE — Assessment & Plan Note (Signed)
Mammogram up-to-date.  Patient politely declines clinical breast exam today.  Deferred pelvic exam as patient follows with GYN.  History of abnormal Pap smear, and she has follow-up scheduled Dr. Glennon Mac.  Advised Pneumovax, Shingrix.  Patient prefers to get the health department.

## 2018-04-15 NOTE — Progress Notes (Signed)
Subjective:    Patient ID: [name], female    DOB: November 11, 1945, 72 y.o.   MRN: [id number]  CC: [name] is a 72 y.o. female who presents today for physical exam.    HPI: HTN- compliant with medication. At home, 115/70. No Cp.   GERD- on protonix. No trouble swallowing. Declines GI consult .   HLD- started on zetia, now taking qod due to myalgia.  On crestor.   Depression- doing well on zoloft 50mg . No si/hi.     Follows with [facility]- CAD; added zetia 01/2018.   Colorectal Cancer Screening: UTD , 2014.  Breast Cancer Screening: Mammogram UTD; declines CBE.  Cervical Cancer Screening: follows with GYN. Seeing GYN early 2020.h/o abnormal. Following with Dr Glennon Mac.  Bone Health screening/DEXA for 65+: Done last year. Osteopenia.  Lung Cancer Screening: Declines screening program.  Immunizations       Tetanus - UTD        Pneumococcal - due  Labs: Screening labs today. Exercise: Gets regular exercise.  Alcohol use: occasional.  Smoking/tobacco use: former smoker.  Wears seat belt: Yes. Skin: no new lesions.   HISTORY:  Past Medical History:  Diagnosis Date  . Abnormal Pap smear of cervix    Dr. Prentice Docker   . Coronary artery disease    a. 06/2009 MI/PCI to LCX and LAD; b. 01/2011 Occluded stents-->CABG x 3 ([facility]): LIMA->LAD, VG->LCX, VG->RPDA; c. 01/2012 Cath: LM 50, LAD 95ost, 186m, LCX 95ost, 120m, RCA 34m, RPDA small, LIMA->LAD ok, VG->LCX ok, VG->RPDA 100-->Med rx.  . Degenerative disc disease, lumbar    L4/5 noted CT ab/pelvis 05/19/11   . Hyperlipidemia   . Hypertension    under control  . Macular degeneration   . Parathyroid adenoma    right 13 x 8 mm s/p resection   . PVD (peripheral vascular disease) ([facility])    a. 01/2010 s/p Aortofemoral bypass.    Past Surgical History:  Procedure Laterality Date  . CARDIAC CATHETERIZATION  2013   [facility]  . CATARACT EXTRACTION    . COLPOSCOPY    . CORONARY ARTERY BYPASS GRAFT  02/07/2010   x 3  . FACIAL  COSMETIC SURGERY  2013   Dr. Nadeen Landau  . FOOT SURGERY     right  . ILIO-FEMORAL BYPASS GRAFT  2011  . MITRAL VALVE REPAIR  01/2010  . ORIF ANKLE FRACTURE Right 10/08/2016   Procedure: OPEN REDUCTION INTERNAL FIXATION (ORIF) ANKLE FRACTURE;  Surgeon: Corky Mull, MD;  Location: [facility] ORS;  Service: Orthopedics;  Laterality: Right;  . PARATHYROIDECTOMY    . TUBAL LIGATION     Family History  Problem Relation Age of Onset  . Heart disease Father       ALLERGIES: Patient has no known allergies.  Current Outpatient Medications on File Prior to Visit  Medication Sig Dispense Refill  . ACIDOPHILUS LACTOBACILLUS PO Take 1 tablet by mouth daily.    Marland Kitchen aspirin 81 MG EC tablet Take 81 mg by mouth daily.     . Cholecalciferol (VITAMIN D3) 1000 UNITS CAPS Take 1,000 Units by mouth daily.     . Coenzyme Q10 100 MG capsule Take 100 mg by mouth daily.     Marland Kitchen ezetimibe (ZETIA) 10 MG tablet Take 1 tablet (10 mg total) by mouth daily. 90 tablet 3  . HYDROcodone-acetaminophen (NORCO) 5-325 MG tablet Take 1 tablet by mouth every 6 (six) hours as needed for up to 9 doses for severe pain. 9  tablet 0  . IRON PO Take 325 mg by mouth daily.     [name] Oil 350 MG CAPS Take 1 capsule by mouth daily.    . metoprolol tartrate (LOPRESSOR) 25 MG tablet TAKE 1/2 TABLET BY MOUTH TWICE DAILY 90 tablet 3  . pantoprazole (PROTONIX) 40 MG tablet TAKE ONE (1) TABLET BY MOUTH EVERY DAY 90 tablet 4  . Probiotic Product (RISA-BID PROBIOTIC PO) Take 250 mg by mouth 2 (two) times daily.     . rosuvastatin (CRESTOR) 40 MG tablet TAKE 1 TABLET BY MOUTH ONCE DAILY 90 tablet 1   No current facility-administered medications on file prior to visit.     Social History   Tobacco Use  . Smoking status: Former Smoker    Packs/day: 1.00    Years: 40.00    Pack years: 40.00    Types: Cigarettes    Last attempt to quit: 07/15/2009    Years since quitting: 8.7  . Smokeless tobacco: Never Used  . Tobacco comment: quit  06/2009 smoked from 20s to 2011 1.5 ppd no FH lung cancer   Substance Use Topics  . Alcohol use: Yes    Alcohol/week: 1.0 standard drinks    Types: 1 Standard drinks or equivalent per week    Comment: occ.  . Drug use: No    Review of Systems  Constitutional: Negative for chills, fever and unexpected weight change.  HENT: Negative for congestion.   Respiratory: Negative for cough.   Cardiovascular: Negative for chest pain, palpitations and leg swelling.  Gastrointestinal: Negative for nausea and vomiting.  Genitourinary: Negative for vaginal bleeding.  Musculoskeletal: Negative for arthralgias and myalgias.  Skin: Negative for rash.  Neurological: Negative for headaches.  Hematological: Negative for adenopathy.  Psychiatric/Behavioral: Negative for confusion.      Objective:    BP 128/64   Pulse 82   Temp 98.6 F (37 C) (Oral)   Ht 5' (1.524 m)   Wt 145 lb 6.4 oz (66 kg)   SpO2 91%   BMI 28.40 kg/m   BP Readings from Last 3 Encounters:  04/15/18 128/64  01/24/18 140/80  08/09/17 124/71   Wt Readings from Last 3 Encounters:  04/15/18 145 lb 6.4 oz (66 kg)  01/24/18 145 lb 4 oz (65.9 kg)  08/09/17 139 lb 3.2 oz (63.1 kg)    Physical Exam Vitals signs reviewed.  Constitutional:      Appearance: She is well-developed.  Eyes:     Conjunctiva/sclera: Conjunctivae normal.  Neck:     Thyroid: No thyroid mass or thyromegaly.  Cardiovascular:     Rate and Rhythm: Normal rate and regular rhythm.     Pulses: Normal pulses.     Heart sounds: Normal heart sounds.  Pulmonary:     Effort: Pulmonary effort is normal.     Breath sounds: Normal breath sounds. No wheezing, rhonchi or rales.  Lymphadenopathy:     Head:     Right side of head: No submental, submandibular, tonsillar, preauricular, posterior auricular or occipital adenopathy.     Left side of head: No submental, submandibular, tonsillar, preauricular, posterior auricular or occipital adenopathy.     Cervical:  No cervical adenopathy.  Skin:    General: Skin is warm and dry.  Neurological:     Mental Status: She is alert.  Psychiatric:        Speech: Speech normal.        Behavior: Behavior normal.        Thought  Content: Thought content normal.        Assessment & Plan:   Problem List Items Addressed This Visit      Cardiovascular and Mediastinum   Essential hypertension, benign    Blood pressure improved after patient rested in room.  Blood pressure readings at home are acceptable.  Advised patient to closely follow. No changes made to regimen today      Coronary artery disease    Pending repeat lipid panel.  Advised patient to stay on Zetia, Lipitor due to CAD.         Digestive   GERD (gastroesophageal reflux disease)    No red flag symptoms today.  Education advised on long-term use of PPIs.  Politely declines referral to gastroenterology.        Other   Encounter for routine adult medical exam with abnormal findings - Primary    Mammogram up-to-date.  Patient politely declines clinical breast exam today.  Deferred pelvic exam as patient follows with GYN.  History of abnormal Pap smear, and she has follow-up scheduled Dr. Glennon Mac.  Advised Pneumovax, Shingrix.  Patient prefers to get the health department.      Relevant Orders   Hemoglobin A1c   CBC   Lipid panel   TSH   VITAMIN D 25 Hydroxy (Vit-D Deficiency, Fractures)   Comprehensive metabolic panel   Generalized anxiety disorder    Doing well on Zoloft 50 mg.  Will continue.      Relevant Medications   sertraline (ZOLOFT) 50 MG tablet       I have discontinued Tahisha Hakim. Flax's PRESERVISION AREDS 2 and predniSONE. I have also changed her sertraline. Additionally, I am having her maintain her aspirin, Vitamin D3, Coenzyme Q10, Krill Oil, Probiotic Product (RISA-BID PROBIOTIC PO), ACIDOPHILUS LACTOBACILLUS PO, IRON PO, pantoprazole, HYDROcodone-acetaminophen, metoprolol tartrate, ezetimibe, and  rosuvastatin.   Meds ordered this encounter  Medications  . sertraline (ZOLOFT) 50 MG tablet    Sig: Take 1 tablet (50 mg total) by mouth at bedtime.    Dispense:  90 tablet    Refill:  1    Order Specific Question:   Supervising Provider    Answer:   Crecencio Mc [[id number]]    Return precautions given.   Risks, benefits, and alternatives of the medications and treatment plan prescribed today were discussed, and patient expressed understanding.   Education regarding symptom management and diagnosis given to patient on AVS.   Continue to follow with Burnard Hawthorne, FNP for routine health maintenance.   Beverly Gust Keatts and I agreed with plan.   [name], FNP

## 2018-04-15 NOTE — Patient Instructions (Addendum)
Monitor blood pressure,  Goal is less than 120/80, based on newest guidelines; if persistently higher, please make sooner follow up appointment so we can recheck you blood pressure and manage medications  Long term use beyond 3 months of proton pump inhibitors , also called PPI's, is associated with malabsorption of vitamins, chronic kidney disease, fracture risk, and diarrheal illnesses. PPI's include Nexium, Prilosec, Protonix, Dexilant, and Prevacid.   I generally recommend trying to control acid reflux with lifestyle modifications including avoiding trigger foods, not eating 2 hours prior to bedtime. You may use histamine 2 blockers daily to twice daily ( this is Zantac, Pepcid) and then when symptoms flare, start back on PPI for short course.   Of note, we will need to do an endoscopy ( upper GI) to evaluate your esophagus, stomach in the future if acid reflux persists are you develop red flag symptoms: trouble swallowing, hoarseness, chronic cough, unexplained weight loss.    Due for pneumoccal 23. ( Pneumovax).  You may also have Shingrex vaccine - please dicuss at [facility] as you prefer.    Health Maintenance for Postmenopausal Women Menopause is a normal process in which your reproductive ability comes to an end. This process happens gradually over a span of months to years, usually between the ages of 73 and 18. Menopause is complete when you have missed 12 consecutive menstrual periods. It is important to talk with your health care provider about some of the most common conditions that affect postmenopausal women, such as heart disease, cancer, and bone loss (osteoporosis). Adopting a healthy lifestyle and getting preventive care can help to promote your health and wellness. Those actions can also lower your chances of developing some of these common conditions. What should I know about menopause? During menopause, you may experience a number of symptoms, such  as:  Moderate-to-severe hot flashes.  Night sweats.  Decrease in sex drive.  Mood swings.  Headaches.  Tiredness.  Irritability.  Memory problems.  Insomnia. Choosing to treat or not to treat menopausal changes is an individual decision that you make with your health care provider. What should I know about hormone replacement therapy and supplements? Hormone therapy products are effective for treating symptoms that are associated with menopause, such as hot flashes and night sweats. Hormone replacement carries certain risks, especially as you become older. If you are thinking about using estrogen or estrogen with progestin treatments, discuss the benefits and risks with your health care provider. What should I know about heart disease and stroke? Heart disease, heart attack, and stroke become more likely as you age. This may be due, in part, to the hormonal changes that your body experiences during menopause. These can affect how your body processes dietary fats, triglycerides, and cholesterol. Heart attack and stroke are both medical emergencies. There are many things that you can do to help prevent heart disease and stroke:  Have your blood pressure checked at least every 1-2 years. High blood pressure causes heart disease and increases the risk of stroke.  If you are 44-40 years old, ask your health care provider if you should take aspirin to prevent a heart attack or a stroke.  Do not use any tobacco products, including cigarettes, chewing tobacco, or electronic cigarettes. If you need help quitting, ask your health care provider.  It is important to eat a healthy diet and maintain a healthy weight. ? Be sure to include plenty of vegetables, fruits, low-fat dairy products, and lean protein. ? Avoid eating  foods that are high in solid fats, added sugars, or salt (sodium).  Get regular exercise. This is one of the most important things that you can do for your health. ? Try to  exercise for at least 150 minutes each week. The type of exercise that you do should increase your heart rate and make you sweat. This is known as moderate-intensity exercise. ? Try to do strengthening exercises at least twice each week. Do these in addition to the moderate-intensity exercise.  Know your numbers.Ask your health care provider to check your cholesterol and your blood glucose. Continue to have your blood tested as directed by your health care provider.  What should I know about cancer screening? There are several types of cancer. Take the following steps to reduce your risk and to catch any cancer development as early as possible. Breast Cancer  Practice breast self-awareness. ? This means understanding how your breasts normally appear and feel. ? It also means doing regular breast self-exams. Let your health care provider know about any changes, no matter how small.  If you are 68 or older, have a clinician do a breast exam (clinical breast exam or CBE) every year. Depending on your age, family history, and medical history, it may be recommended that you also have a yearly breast X-ray (mammogram).  If you have a family history of breast cancer, talk with your health care provider about genetic screening.  If you are at high risk for breast cancer, talk with your health care provider about having an MRI and a mammogram every year.  Breast cancer (BRCA) gene test is recommended for women who have family members with BRCA-related cancers. Results of the assessment will determine the need for genetic counseling and BRCA1 and for BRCA2 testing. BRCA-related cancers include these types: ? Breast. This occurs in males or females. ? Ovarian. ? Tubal. This may also be called fallopian tube cancer. ? Cancer of the abdominal or pelvic lining (peritoneal cancer). ? Prostate. ? Pancreatic. Cervical, Uterine, and Ovarian Cancer Your health care provider may recommend that you be  screened regularly for cancer of the pelvic organs. These include your ovaries, uterus, and vagina. This screening involves a pelvic exam, which includes checking for microscopic changes to the surface of your cervix (Pap test).  For women ages 21-65, health care providers may recommend a pelvic exam and a Pap test every three years. For women ages 57-65, they may recommend the Pap test and pelvic exam, combined with testing for human papilloma virus (HPV), every five years. Some types of HPV increase your risk of cervical cancer. Testing for HPV may also be done on women of any age who have unclear Pap test results.  Other health care providers may not recommend any screening for nonpregnant women who are considered low risk for pelvic cancer and have no symptoms. Ask your health care provider if a screening pelvic exam is right for you.  If you have had past treatment for cervical cancer or a condition that could lead to cancer, you need Pap tests and screening for cancer for at least 20 years after your treatment. If Pap tests have been discontinued for you, your risk factors (such as having a new sexual partner) need to be reassessed to determine if you should start having screenings again. Some women have medical problems that increase the chance of getting cervical cancer. In these cases, your health care provider may recommend that you have screening and Pap tests more often.  If you have a family history of uterine cancer or ovarian cancer, talk with your health care provider about genetic screening.  If you have vaginal bleeding after reaching menopause, tell your health care provider.  There are currently no reliable tests available to screen for ovarian cancer. Lung Cancer Lung cancer screening is recommended for adults 42-52 years old who are at high risk for lung cancer because of a history of smoking. A yearly low-dose CT scan of the lungs is recommended if you:  Currently  smoke.  Have a history of at least 30 pack-years of smoking and you currently smoke or have quit within the past 15 years. A pack-year is smoking an average of one pack of cigarettes per day for one year. Yearly screening should:  Continue until it has been 15 years since you quit.  Stop if you develop a health problem that would prevent you from having lung cancer treatment. Colorectal Cancer  This type of cancer can be detected and can often be prevented.  Routine colorectal cancer screening usually begins at age 54 and continues through age 10.  If you have risk factors for colon cancer, your health care provider may recommend that you be screened at an earlier age.  If you have a family history of colorectal cancer, talk with your health care provider about genetic screening.  Your health care provider may also recommend using home test kits to check for hidden blood in your stool.  A small camera at the end of a tube can be used to examine your colon directly (sigmoidoscopy or colonoscopy). This is done to check for the earliest forms of colorectal cancer.  Direct examination of the colon should be repeated every 5-10 years until age 41. However, if early forms of precancerous polyps or small growths are found or if you have a family history or genetic risk for colorectal cancer, you may need to be screened more often. Skin Cancer  Check your skin from head to toe regularly.  Monitor any moles. Be sure to tell your health care provider: ? About any new moles or changes in moles, especially if there is a change in a mole's shape or color. ? If you have a mole that is larger than the size of a pencil eraser.  If any of your family members has a history of skin cancer, especially at a young age, talk with your health care provider about genetic screening.  Always use sunscreen. Apply sunscreen liberally and repeatedly throughout the day.  Whenever you are outside, protect  yourself by wearing long sleeves, pants, a wide-brimmed hat, and sunglasses. What should I know about osteoporosis? Osteoporosis is a condition in which bone destruction happens more quickly than new bone creation. After menopause, you may be at an increased risk for osteoporosis. To help prevent osteoporosis or the bone fractures that can happen because of osteoporosis, the following is recommended:  If you are 87-34 years old, get at least 1,000 mg of calcium and at least 600 mg of vitamin D per day.  If you are older than age 19 but younger than age 87, get at least 1,200 mg of calcium and at least 600 mg of vitamin D per day.  If you are older than age 88, get at least 1,200 mg of calcium and at least 800 mg of vitamin D per day. Smoking and excessive alcohol intake increase the risk of osteoporosis. Eat foods that are rich in calcium and vitamin D, and  do weight-bearing exercises several times each week as directed by your health care provider. What should I know about how menopause affects my mental health? Depression may occur at any age, but it is more common as you become older. Common symptoms of depression include:  Low or sad mood.  Changes in sleep patterns.  Changes in appetite or eating patterns.  Feeling an overall lack of motivation or enjoyment of activities that you previously enjoyed.  Frequent crying spells. Talk with your health care provider if you think that you are experiencing depression. What should I know about immunizations? It is important that you get and maintain your immunizations. These include:  Tetanus, diphtheria, and pertussis (Tdap) booster vaccine.  Influenza every year before the flu season begins.  Pneumonia vaccine.  Shingles vaccine. Your health care provider may also recommend other immunizations. This information is not intended to replace advice given to you by your health care provider. Make sure you discuss any questions you have with  your health care provider. Document Released: 05/29/2005 Document Revised: 10/25/2015 Document Reviewed: 01/08/2015 Elsevier Interactive Patient Education  2019 [employer].

## 2018-04-15 NOTE — Assessment & Plan Note (Signed)
Pending repeat lipid panel.  Advised patient to stay on Zetia, Lipitor due to CAD.

## 2018-04-15 NOTE — Assessment & Plan Note (Signed)
Blood pressure improved after patient rested in room.  Blood pressure readings at home are acceptable.  Advised patient to closely follow. No changes made to regimen today

## 2018-04-18 DIAGNOSIS — M9903 Segmental and somatic dysfunction of lumbar region: Secondary | ICD-10-CM | POA: Diagnosis not present

## 2018-04-18 DIAGNOSIS — M955 Acquired deformity of pelvis: Secondary | ICD-10-CM | POA: Diagnosis not present

## 2018-04-18 DIAGNOSIS — M4306 Spondylolysis, lumbar region: Secondary | ICD-10-CM | POA: Diagnosis not present

## 2018-04-18 DIAGNOSIS — M9905 Segmental and somatic dysfunction of pelvic region: Secondary | ICD-10-CM | POA: Diagnosis not present

## 2018-04-21 ENCOUNTER — Encounter: Payer: Self-pay | Admitting: Family

## 2018-04-24 ENCOUNTER — Other Ambulatory Visit: Payer: Self-pay | Admitting: Family

## 2018-04-24 ENCOUNTER — Encounter: Payer: Self-pay | Admitting: Family

## 2018-04-24 DIAGNOSIS — R748 Abnormal levels of other serum enzymes: Secondary | ICD-10-CM

## 2018-04-25 DIAGNOSIS — M4306 Spondylolysis, lumbar region: Secondary | ICD-10-CM | POA: Diagnosis not present

## 2018-04-25 DIAGNOSIS — M955 Acquired deformity of pelvis: Secondary | ICD-10-CM | POA: Diagnosis not present

## 2018-04-25 DIAGNOSIS — M9905 Segmental and somatic dysfunction of pelvic region: Secondary | ICD-10-CM | POA: Diagnosis not present

## 2018-04-25 DIAGNOSIS — M9903 Segmental and somatic dysfunction of lumbar region: Secondary | ICD-10-CM | POA: Diagnosis not present

## 2018-04-27 ENCOUNTER — Encounter: Payer: Self-pay | Admitting: Family

## 2018-04-28 NOTE — Telephone Encounter (Signed)
I spoke with the patient. She advised that she is concerned by lab work done by her PCP [name]).   04/15/18: AST- 52 ALT- 65  Per [name]- she was going to have the patient repeat these numbers. Patient was to call to schedule.   Confirmed with the patient that she is taking Crestor 40 mg once daily & Zetia 10 mg every other day. She had stopped zetia a few weeks ago due to back pain and has restarted at every other day and is tolerating this.  I have advised that I will need to review her labs with Dr. Rockey Situ. I advised since she is under 3 x greater than normal limit, it is not urgent, but will review with Dr. Rockey Situ for further recommendations.  She is concerned as she is also taking hydrocodone.   To Dr. Rockey Situ to review/ recommendations.

## 2018-04-28 NOTE — Telephone Encounter (Signed)
Pt c/o medication issue:  1. Name of Medication: zetia   2. How are you currently taking this medication (dosage and times per day)? 10 mg po  q d   3. Are you having a reaction (difficulty breathing--STAT)? Not sure   4. What is your medication issue? Patient concerned about recent lab results alt and ast levels have marked change

## 2018-04-29 NOTE — Telephone Encounter (Signed)
Reviewed the patient's most recent lab work with Dr. Rockey Situ. Dr. Rockey Situ reviewed the patient's chart and previous lab work- he is not concerned at this time about the slight increase in the patient's liver enzymes. He feels this is unrelated to the addition of Zetia. He questions if the patient has had a change in her diet over the holidays or any change in her weight. He would like the patient to continue crestor and zetia at this time. Follow up labs per [name], NP.  I have left a detailed message on the patient's voice mail of the above recommendations.  I asked that she call back with any further questions/ concerns.

## 2018-05-02 DIAGNOSIS — M4306 Spondylolysis, lumbar region: Secondary | ICD-10-CM | POA: Diagnosis not present

## 2018-05-02 DIAGNOSIS — M9905 Segmental and somatic dysfunction of pelvic region: Secondary | ICD-10-CM | POA: Diagnosis not present

## 2018-05-02 DIAGNOSIS — M9903 Segmental and somatic dysfunction of lumbar region: Secondary | ICD-10-CM | POA: Diagnosis not present

## 2018-05-02 DIAGNOSIS — M955 Acquired deformity of pelvis: Secondary | ICD-10-CM | POA: Diagnosis not present

## 2018-05-16 DIAGNOSIS — M9905 Segmental and somatic dysfunction of pelvic region: Secondary | ICD-10-CM | POA: Diagnosis not present

## 2018-05-16 DIAGNOSIS — M9903 Segmental and somatic dysfunction of lumbar region: Secondary | ICD-10-CM | POA: Diagnosis not present

## 2018-05-16 DIAGNOSIS — M955 Acquired deformity of pelvis: Secondary | ICD-10-CM | POA: Diagnosis not present

## 2018-05-16 DIAGNOSIS — M4306 Spondylolysis, lumbar region: Secondary | ICD-10-CM | POA: Diagnosis not present

## 2018-05-19 ENCOUNTER — Encounter (INDEPENDENT_AMBULATORY_CARE_PROVIDER_SITE_OTHER): Payer: Medicare HMO | Admitting: Ophthalmology

## 2018-05-19 DIAGNOSIS — H353231 Exudative age-related macular degeneration, bilateral, with active choroidal neovascularization: Secondary | ICD-10-CM | POA: Diagnosis not present

## 2018-05-19 DIAGNOSIS — I10 Essential (primary) hypertension: Secondary | ICD-10-CM | POA: Diagnosis not present

## 2018-05-19 DIAGNOSIS — H353112 Nonexudative age-related macular degeneration, right eye, intermediate dry stage: Secondary | ICD-10-CM | POA: Diagnosis not present

## 2018-05-19 DIAGNOSIS — H43813 Vitreous degeneration, bilateral: Secondary | ICD-10-CM

## 2018-05-19 DIAGNOSIS — H35371 Puckering of macula, right eye: Secondary | ICD-10-CM | POA: Diagnosis not present

## 2018-05-19 DIAGNOSIS — H35033 Hypertensive retinopathy, bilateral: Secondary | ICD-10-CM | POA: Diagnosis not present

## 2018-05-23 DIAGNOSIS — M5136 Other intervertebral disc degeneration, lumbar region: Secondary | ICD-10-CM | POA: Diagnosis not present

## 2018-05-30 DIAGNOSIS — M9903 Segmental and somatic dysfunction of lumbar region: Secondary | ICD-10-CM | POA: Diagnosis not present

## 2018-05-30 DIAGNOSIS — M955 Acquired deformity of pelvis: Secondary | ICD-10-CM | POA: Diagnosis not present

## 2018-05-30 DIAGNOSIS — M9905 Segmental and somatic dysfunction of pelvic region: Secondary | ICD-10-CM | POA: Diagnosis not present

## 2018-05-30 DIAGNOSIS — M4306 Spondylolysis, lumbar region: Secondary | ICD-10-CM | POA: Diagnosis not present

## 2018-06-20 DIAGNOSIS — M9905 Segmental and somatic dysfunction of pelvic region: Secondary | ICD-10-CM | POA: Diagnosis not present

## 2018-06-20 DIAGNOSIS — M955 Acquired deformity of pelvis: Secondary | ICD-10-CM | POA: Diagnosis not present

## 2018-06-20 DIAGNOSIS — M4306 Spondylolysis, lumbar region: Secondary | ICD-10-CM | POA: Diagnosis not present

## 2018-06-20 DIAGNOSIS — M9903 Segmental and somatic dysfunction of lumbar region: Secondary | ICD-10-CM | POA: Diagnosis not present

## 2018-07-18 DIAGNOSIS — M4306 Spondylolysis, lumbar region: Secondary | ICD-10-CM | POA: Diagnosis not present

## 2018-07-18 DIAGNOSIS — M9905 Segmental and somatic dysfunction of pelvic region: Secondary | ICD-10-CM | POA: Diagnosis not present

## 2018-07-18 DIAGNOSIS — M9903 Segmental and somatic dysfunction of lumbar region: Secondary | ICD-10-CM | POA: Diagnosis not present

## 2018-07-18 DIAGNOSIS — M955 Acquired deformity of pelvis: Secondary | ICD-10-CM | POA: Diagnosis not present

## 2018-07-20 DIAGNOSIS — M4306 Spondylolysis, lumbar region: Secondary | ICD-10-CM | POA: Diagnosis not present

## 2018-07-20 DIAGNOSIS — M9905 Segmental and somatic dysfunction of pelvic region: Secondary | ICD-10-CM | POA: Diagnosis not present

## 2018-07-20 DIAGNOSIS — M955 Acquired deformity of pelvis: Secondary | ICD-10-CM | POA: Diagnosis not present

## 2018-07-20 DIAGNOSIS — M9903 Segmental and somatic dysfunction of lumbar region: Secondary | ICD-10-CM | POA: Diagnosis not present

## 2018-07-25 DIAGNOSIS — M5136 Other intervertebral disc degeneration, lumbar region: Secondary | ICD-10-CM | POA: Diagnosis not present

## 2018-07-26 DIAGNOSIS — M9905 Segmental and somatic dysfunction of pelvic region: Secondary | ICD-10-CM | POA: Diagnosis not present

## 2018-07-26 DIAGNOSIS — M9903 Segmental and somatic dysfunction of lumbar region: Secondary | ICD-10-CM | POA: Diagnosis not present

## 2018-07-26 DIAGNOSIS — M4306 Spondylolysis, lumbar region: Secondary | ICD-10-CM | POA: Diagnosis not present

## 2018-07-26 DIAGNOSIS — M955 Acquired deformity of pelvis: Secondary | ICD-10-CM | POA: Diagnosis not present

## 2018-08-04 ENCOUNTER — Other Ambulatory Visit: Payer: Self-pay

## 2018-08-04 ENCOUNTER — Encounter (INDEPENDENT_AMBULATORY_CARE_PROVIDER_SITE_OTHER): Payer: Medicare HMO | Admitting: Ophthalmology

## 2018-08-04 DIAGNOSIS — I10 Essential (primary) hypertension: Secondary | ICD-10-CM

## 2018-08-04 DIAGNOSIS — H43813 Vitreous degeneration, bilateral: Secondary | ICD-10-CM

## 2018-08-04 DIAGNOSIS — H353221 Exudative age-related macular degeneration, left eye, with active choroidal neovascularization: Secondary | ICD-10-CM

## 2018-08-04 DIAGNOSIS — H35033 Hypertensive retinopathy, bilateral: Secondary | ICD-10-CM

## 2018-08-04 DIAGNOSIS — H353112 Nonexudative age-related macular degeneration, right eye, intermediate dry stage: Secondary | ICD-10-CM

## 2018-10-05 ENCOUNTER — Other Ambulatory Visit: Payer: Self-pay | Admitting: Family

## 2018-10-05 ENCOUNTER — Other Ambulatory Visit: Payer: Self-pay | Admitting: Cardiovascular Disease

## 2018-10-05 DIAGNOSIS — F411 Generalized anxiety disorder: Secondary | ICD-10-CM

## 2018-10-05 DIAGNOSIS — K219 Gastro-esophageal reflux disease without esophagitis: Secondary | ICD-10-CM

## 2018-10-07 ENCOUNTER — Encounter: Payer: Self-pay | Admitting: Family

## 2018-10-08 ENCOUNTER — Encounter: Payer: Self-pay | Admitting: Family

## 2018-10-10 ENCOUNTER — Other Ambulatory Visit: Payer: Self-pay

## 2018-10-10 DIAGNOSIS — K219 Gastro-esophageal reflux disease without esophagitis: Secondary | ICD-10-CM

## 2018-10-10 MED ORDER — PANTOPRAZOLE SODIUM 40 MG PO TBEC: DELAYED_RELEASE_TABLET | ORAL | 4 refills | Status: DC

## 2018-10-27 ENCOUNTER — Encounter (INDEPENDENT_AMBULATORY_CARE_PROVIDER_SITE_OTHER): Payer: Medicare HMO | Admitting: Ophthalmology

## 2018-10-31 ENCOUNTER — Other Ambulatory Visit: Payer: Self-pay

## 2018-10-31 ENCOUNTER — Encounter (INDEPENDENT_AMBULATORY_CARE_PROVIDER_SITE_OTHER): Payer: Medicare HMO | Admitting: Ophthalmology

## 2018-10-31 DIAGNOSIS — H353112 Nonexudative age-related macular degeneration, right eye, intermediate dry stage: Secondary | ICD-10-CM | POA: Diagnosis not present

## 2018-10-31 DIAGNOSIS — H353221 Exudative age-related macular degeneration, left eye, with active choroidal neovascularization: Secondary | ICD-10-CM | POA: Diagnosis not present

## 2018-10-31 DIAGNOSIS — H43813 Vitreous degeneration, bilateral: Secondary | ICD-10-CM | POA: Diagnosis not present

## 2018-10-31 DIAGNOSIS — I10 Essential (primary) hypertension: Secondary | ICD-10-CM | POA: Diagnosis not present

## 2018-10-31 DIAGNOSIS — H35033 Hypertensive retinopathy, bilateral: Secondary | ICD-10-CM | POA: Diagnosis not present

## 2018-11-24 ENCOUNTER — Other Ambulatory Visit: Payer: Self-pay | Admitting: Cardiovascular Disease

## 2018-12-01 ENCOUNTER — Encounter: Payer: Self-pay | Admitting: Family

## 2018-12-02 NOTE — Telephone Encounter (Signed)
Left message for patient to return call to office. 

## 2018-12-07 ENCOUNTER — Other Ambulatory Visit: Payer: Self-pay

## 2018-12-07 ENCOUNTER — Ambulatory Visit (INDEPENDENT_AMBULATORY_CARE_PROVIDER_SITE_OTHER): Payer: Medicare HMO | Admitting: Family

## 2018-12-07 ENCOUNTER — Encounter: Payer: Self-pay | Admitting: Family

## 2018-12-07 DIAGNOSIS — I10 Essential (primary) hypertension: Secondary | ICD-10-CM

## 2018-12-07 DIAGNOSIS — F411 Generalized anxiety disorder: Secondary | ICD-10-CM

## 2018-12-07 DIAGNOSIS — R69 Illness, unspecified: Secondary | ICD-10-CM | POA: Diagnosis not present

## 2018-12-07 DIAGNOSIS — Z87891 Personal history of nicotine dependence: Secondary | ICD-10-CM | POA: Diagnosis not present

## 2018-12-07 MED ORDER — SERTRALINE HCL 100 MG PO TABS: 100.0000 mg | ORAL_TABLET | Freq: Every day | ORAL | 3 refills | Status: DC

## 2018-12-07 NOTE — Assessment & Plan Note (Signed)
At goal; continue regimen

## 2018-12-07 NOTE — Assessment & Plan Note (Signed)
Overall doing well. Appears lonely. I do think a therapy dog may be beneficial; note provided for her to share with her landlord. She will let me know how she is doing.

## 2018-12-07 NOTE — Assessment & Plan Note (Signed)
Advised and she declines CT Chest lung cancer screen. She will let me know if she changes her mind.

## 2018-12-07 NOTE — Patient Instructions (Addendum)
Please go to [facility] for labs. We want to ensure liver enzymes normal.   Let me know If you would like to have CT chest lung cancer screen at any time; I strongly advise this.   Let me know if you dont feel better with therapy dog - stay safe!

## 2018-12-07 NOTE — Progress Notes (Signed)
This visit type was conducted due to national recommendations for restrictions regarding the COVID-19 pandemic (e.g. social distancing).  This format is felt to be most appropriate for this patient at this time.  All issues noted in this document were discussed and addressed.  No physical exam was performed (except for noted visual exam findings with Video Visits). Virtual Visit via Video Note  I connected with@  on 12/07/18 at 10:30 AM EDT by a video enabled telemedicine application and verified that I am speaking with the correct person using two identifiers.  Location patient: home Location provider:work  Persons participating in the virtual visit: patient, provider  I discussed the limitations of evaluation and management by telemedicine and the availability of in person appointments. The patient expressed understanding and agreed to proceed.   HPI:  Depression and anxiety- Feels well the past couple of weeks.   Being home during pandemic has been lonely for patient. Not crying. No si/hi.  Sleeping well.  would like to discuss therapy pet. Dog has  Been fostered and is going to stay with patient for a couple of days. Feels well on zoloft 100mg .   HTN- compliant with medication.115/70. Denies exertional chest pain or pressure, numbness or tingling radiating to left arm or jaw, palpitations, dizziness, frequent headaches, changes in vision, or shortness of breath.    GERD- stable on medication. No pain or trouble with swallowing.     ROS: See pertinent positives and negatives per HPI.  Past Medical History:  Diagnosis Date  . Abnormal Pap smear of cervix    Dr. Prentice Docker   . Coronary artery disease    a. 06/2009 MI/PCI to LCX and LAD; b. 01/2011 Occluded stents-->CABG x 3 ([facility]): LIMA->LAD, VG->LCX, VG->RPDA; c. 01/2012 Cath: LM 50, LAD 95ost, 146m, LCX 95ost, 154m, RCA 65m, RPDA small, LIMA->LAD ok, VG->LCX ok, VG->RPDA 100-->Med rx.  . Degenerative disc disease, lumbar    L4/5 noted CT ab/pelvis 05/19/11   . Hyperlipidemia   . Hypertension    under control  . Macular degeneration   . Parathyroid adenoma    right 13 x 8 mm s/p resection   . PVD (peripheral vascular disease) ([place])    a. 01/2010 s/p Aortofemoral bypass.    Past Surgical History:  Procedure Laterality Date  . CARDIAC CATHETERIZATION  2013   [facility]  . CATARACT EXTRACTION    . COLPOSCOPY    . CORONARY ARTERY BYPASS GRAFT  02/07/2010   x 3  . FACIAL COSMETIC SURGERY  2013   Dr. Nadeen Landau  . FOOT SURGERY     right  . ILIO-FEMORAL BYPASS GRAFT  2011  . MITRAL VALVE REPAIR  01/2010  . ORIF ANKLE FRACTURE Right 10/08/2016   Procedure: OPEN REDUCTION INTERNAL FIXATION (ORIF) ANKLE FRACTURE;  Surgeon: Corky Mull, MD;  Location: [facility] ORS;  Service: Orthopedics;  Laterality: Right;  . PARATHYROIDECTOMY    . TUBAL LIGATION      Family History  Problem Relation Age of Onset  . Heart disease Father     SOCIAL HX: former smoker   Current Outpatient Medications:  .  ACIDOPHILUS LACTOBACILLUS PO, Take 1 tablet by mouth daily., Disp: , Rfl:  .  aspirin 81 MG EC tablet, Take 81 mg by mouth daily. , Disp: , Rfl:  .  Cholecalciferol (VITAMIN D3) 1000 UNITS CAPS, Take 1,000 Units by mouth daily. , Disp: , Rfl:  .  ezetimibe (ZETIA) 10 MG tablet, TAKE 1 TABLET BY MOUTH ONCE DAILY.,  Disp: 90 tablet, Rfl: 3 .  fluticasone (FLONASE) 50 MCG/ACT nasal spray, Place 1 spray into both nostrils daily., Disp: , Rfl:  .  HYDROcodone-acetaminophen (NORCO) 5-325 MG tablet, Take 1 tablet by mouth every 6 (six) hours as needed for up to 9 doses for severe pain., Disp: 9 tablet, Rfl: 0 .  metoprolol tartrate (LOPRESSOR) 25 MG tablet, TAKE 1/2 TABLET BY MOUTH TWICE DAILY, Disp: 90 tablet, Rfl: 3 .  pantoprazole (PROTONIX) 40 MG tablet, TAKE 1 TABLET BY MOUTH ONCE DAILY., Disp: 90 tablet, Rfl: 4 .  pantoprazole (PROTONIX) 40 MG tablet, TAKE ONE (1) TABLET BY MOUTH EVERY DAY, Disp: 90 tablet, Rfl: 4 .   Probiotic Product (RISA-BID PROBIOTIC PO), Take 250 mg by mouth 2 (two) times daily. , Disp: , Rfl:  .  rosuvastatin (CRESTOR) 40 MG tablet, TAKE 1 TABLET BY MOUTH ONCE DAILY, Disp: 90 tablet, Rfl: 0 .  Coenzyme Q10 100 MG capsule, Take 100 mg by mouth daily. , Disp: , Rfl:  .  IRON PO, Take 325 mg by mouth daily. , Disp: , Rfl:  .  Krill Oil 350 MG CAPS, Take 1 capsule by mouth daily., Disp: , Rfl:  .  sertraline (ZOLOFT) 100 MG tablet, Take 1 tablet (100 mg total) by mouth daily., Disp: 90 tablet, Rfl: 3  EXAM:  VITALS per patient if applicable:  GENERAL: alert, oriented, appears well and in no acute distress  HEENT: atraumatic, conjunttiva clear, no obvious abnormalities on inspection of external nose and ears  NECK: normal movements of the head and neck  LUNGS: on inspection no signs of respiratory distress, breathing rate appears normal, no obvious gross SOB, gasping or wheezing  CV: no obvious cyanosis  MS: moves all visible extremities without noticeable abnormality  PSYCH/NEURO: pleasant and cooperative, no obvious depression or anxiety, speech and thought processing grossly intact  ASSESSMENT AND PLAN:  Discussed the following assessment and plan:  Problem List Items Addressed This Visit      Cardiovascular and Mediastinum   Essential hypertension, benign    At goal; continue regimen        Other   Generalized anxiety disorder - Primary    Overall doing well. Appears lonely. I do think a therapy dog may be beneficial; note provided for her to share with her landlord. She will let me know how she is doing.      Relevant Medications   sertraline (ZOLOFT) 100 MG tablet   Other Relevant Orders   Comprehensive metabolic panel   Former smoker    Advised and she declines CT Chest lung cancer screen. She will let me know if she changes her mind.             I discussed the assessment and treatment plan with the patient. The patient was provided an opportunity  to ask questions and all were answered. The patient agreed with the plan and demonstrated an understanding of the instructions.   The patient was advised to call back or seek an in-person evaluation if the symptoms worsen or if the condition fails to improve as anticipated.   [name], FNP

## 2018-12-11 ENCOUNTER — Encounter: Payer: Self-pay | Admitting: Family

## 2018-12-12 ENCOUNTER — Other Ambulatory Visit: Payer: Self-pay

## 2018-12-12 DIAGNOSIS — F411 Generalized anxiety disorder: Secondary | ICD-10-CM

## 2018-12-12 MED ORDER — SERTRALINE HCL 100 MG PO TABS: 100.0000 mg | ORAL_TABLET | Freq: Every day | ORAL | 3 refills | Status: DC

## 2018-12-16 DIAGNOSIS — C44619 Basal cell carcinoma of skin of left upper limb, including shoulder: Secondary | ICD-10-CM | POA: Diagnosis not present

## 2018-12-16 DIAGNOSIS — L570 Actinic keratosis: Secondary | ICD-10-CM | POA: Diagnosis not present

## 2018-12-16 DIAGNOSIS — D485 Neoplasm of uncertain behavior of skin: Secondary | ICD-10-CM | POA: Diagnosis not present

## 2018-12-18 ENCOUNTER — Other Ambulatory Visit: Payer: Self-pay

## 2018-12-18 ENCOUNTER — Emergency Department
Admission: EM | Admit: 2018-12-18 | Discharge: 2018-12-18 | Disposition: A | Discharge status: Home / Self Care | Payer: Medicare HMO | Attending: Emergency Medicine | Admitting: Emergency Medicine

## 2018-12-18 ENCOUNTER — Emergency Department: Payer: Medicare HMO

## 2018-12-18 DIAGNOSIS — Y999 Unspecified external cause status: Secondary | ICD-10-CM | POA: Insufficient documentation

## 2018-12-18 DIAGNOSIS — W010XXA Fall on same level from slipping, tripping and stumbling without subsequent striking against object, initial encounter: Secondary | ICD-10-CM | POA: Insufficient documentation

## 2018-12-18 DIAGNOSIS — Y929 Unspecified place or not applicable: Secondary | ICD-10-CM | POA: Diagnosis not present

## 2018-12-18 DIAGNOSIS — Z79899 Other long term (current) drug therapy: Secondary | ICD-10-CM | POA: Insufficient documentation

## 2018-12-18 DIAGNOSIS — S6991XA Unspecified injury of right wrist, hand and finger(s), initial encounter: Secondary | ICD-10-CM | POA: Diagnosis present

## 2018-12-18 DIAGNOSIS — S82831A Other fracture of upper and lower end of right fibula, initial encounter for closed fracture: Secondary | ICD-10-CM | POA: Diagnosis not present

## 2018-12-18 DIAGNOSIS — Y93E2 Activity, laundry: Secondary | ICD-10-CM | POA: Diagnosis not present

## 2018-12-18 DIAGNOSIS — Z87891 Personal history of nicotine dependence: Secondary | ICD-10-CM | POA: Diagnosis not present

## 2018-12-18 DIAGNOSIS — I2510 Atherosclerotic heart disease of native coronary artery without angina pectoris: Secondary | ICD-10-CM | POA: Diagnosis not present

## 2018-12-18 DIAGNOSIS — I10 Essential (primary) hypertension: Secondary | ICD-10-CM | POA: Diagnosis not present

## 2018-12-18 DIAGNOSIS — S52591A Other fractures of lower end of right radius, initial encounter for closed fracture: Secondary | ICD-10-CM | POA: Diagnosis not present

## 2018-12-18 DIAGNOSIS — M25531 Pain in right wrist: Secondary | ICD-10-CM | POA: Diagnosis not present

## 2018-12-18 IMAGING — CR RIGHT WRIST - COMPLETE 3+ VIEW
1 series · 4 of 4 positions shown · non-contrast
Comparison: None.

CLINICAL DATA: Right wrist pain after fall taking off pants.

EXAM:
RIGHT WRIST - COMPLETE 3+ VIEW

[Series 1: dg wrist complete right · 0.14mm/px · 4 of 4 slices shown]
[im 1/4]
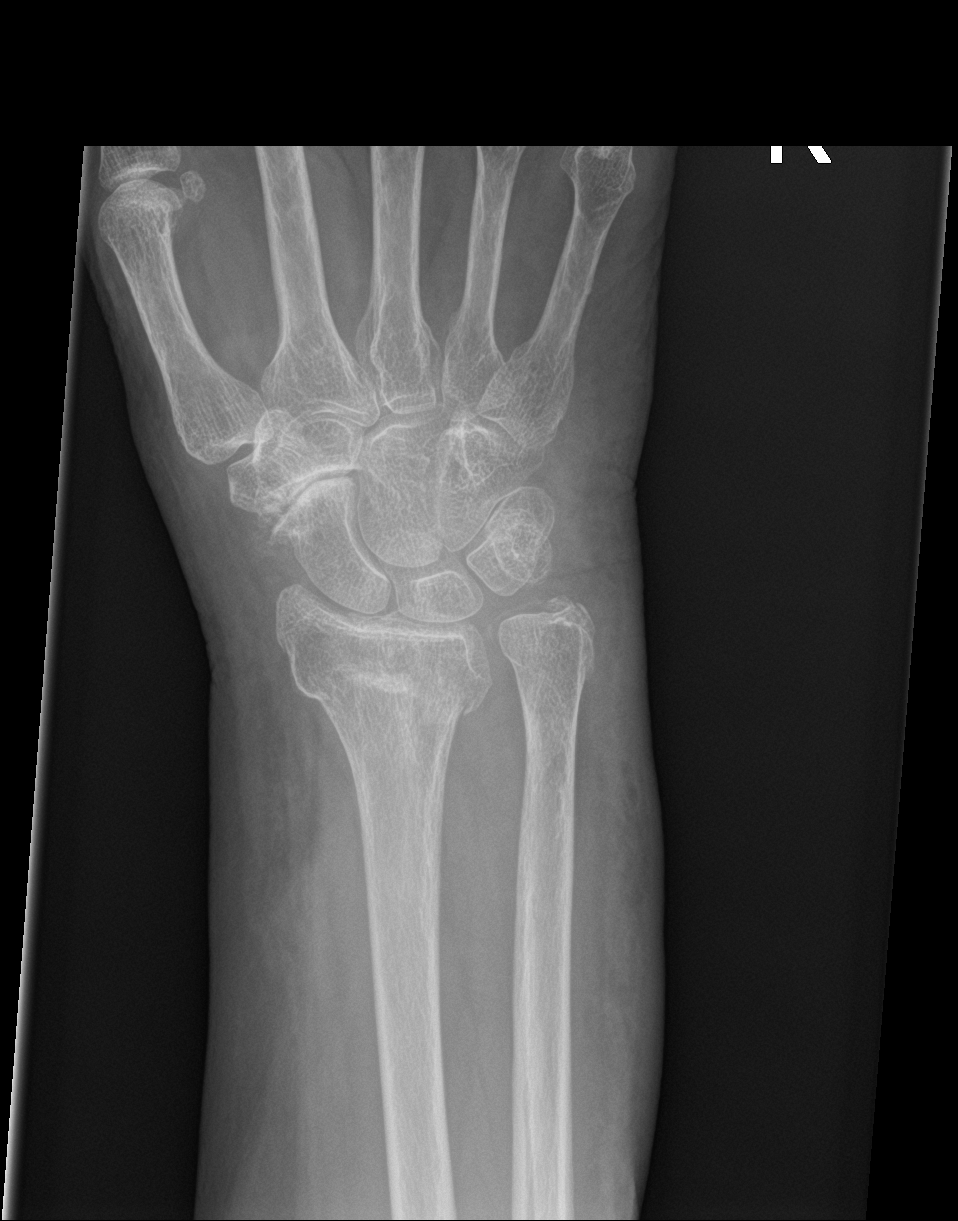
[im 2/4]
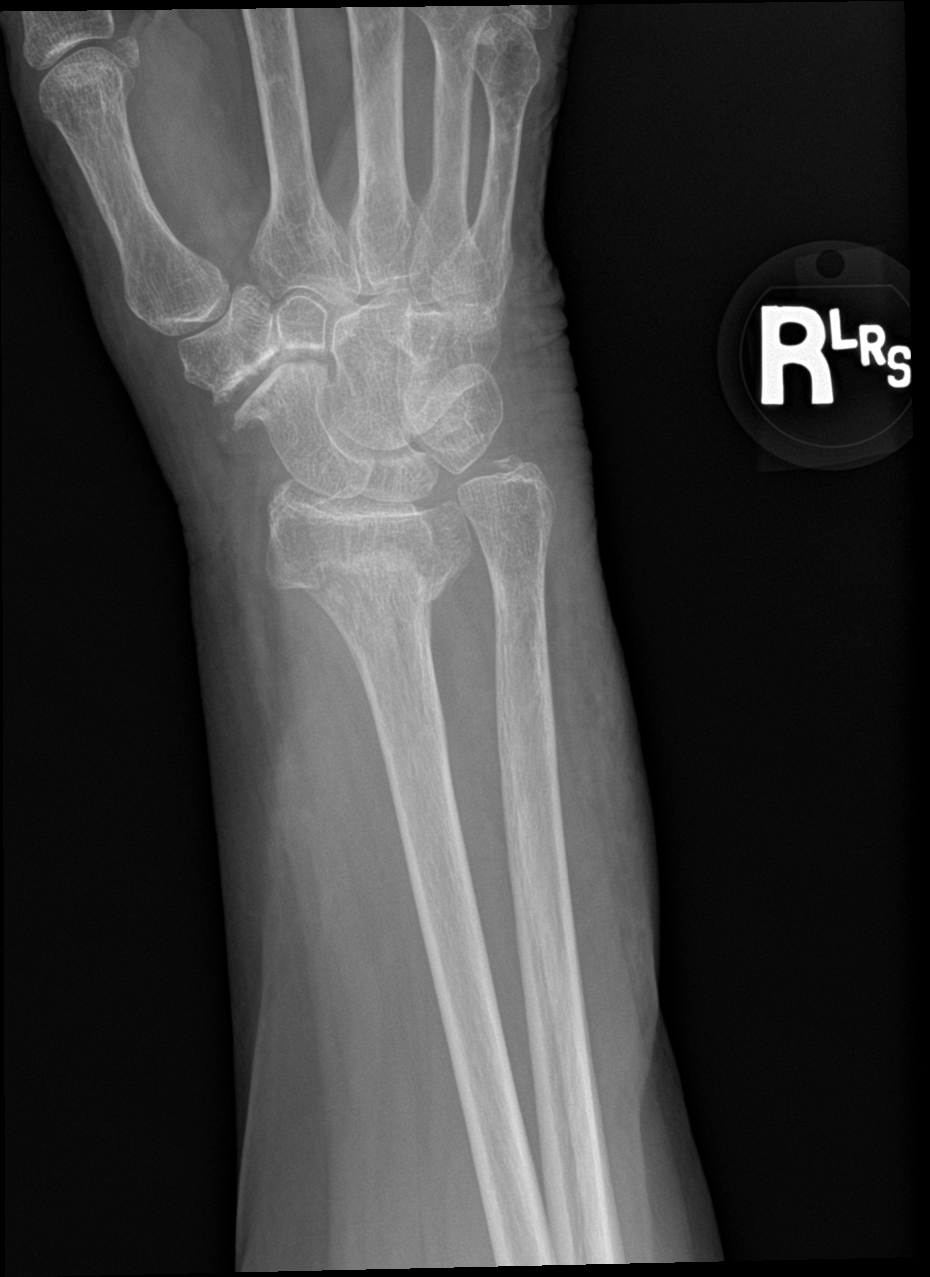
[im 3/4]
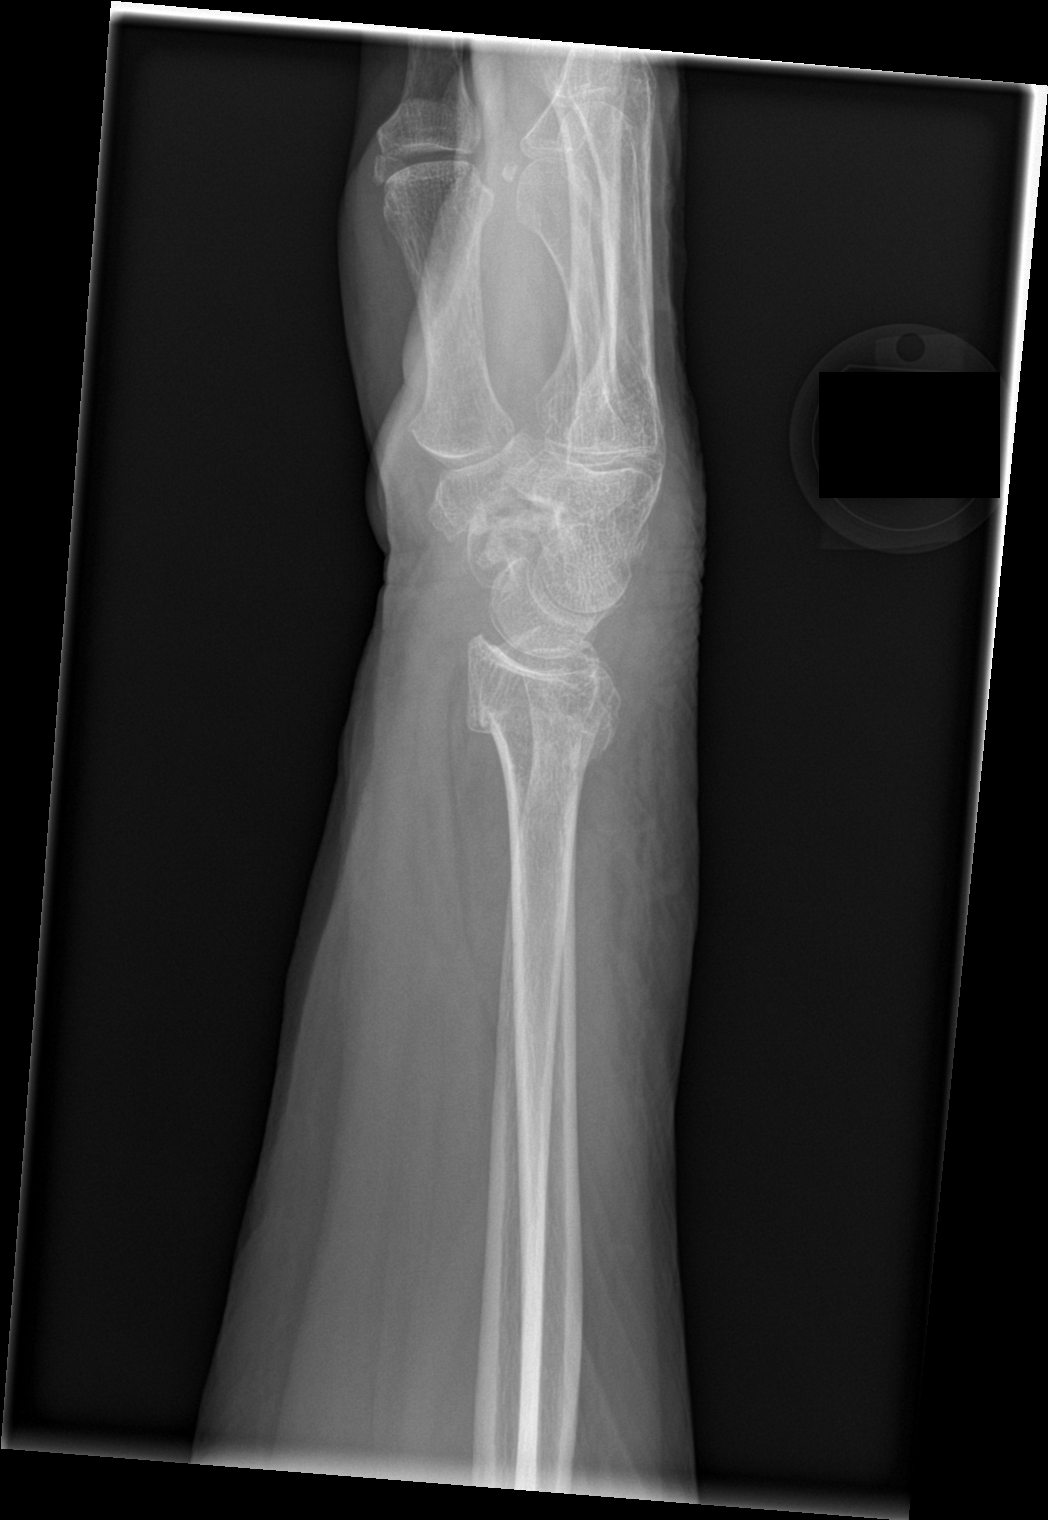
[im 4/4]
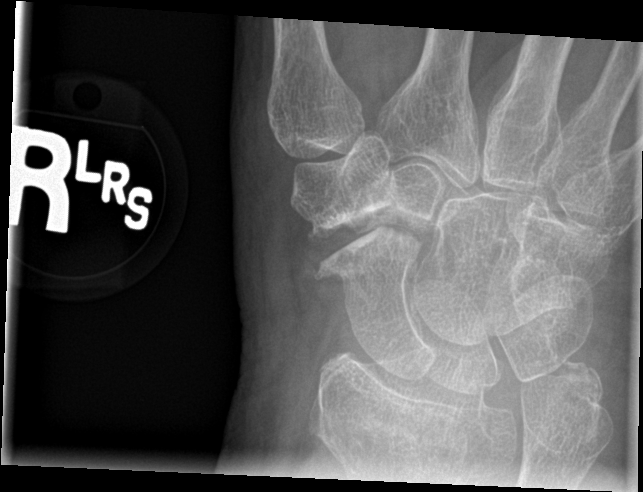

[4 of 4 positions shown; findings below may reference images not displayed]

FINDINGS: Comminuted impacted fracture of the distal radius extending into the
radiocarpal and distal radioulnar joint. No significant angulation.
Slight offset of the distal radioulnar joint. Minimally displaced
ulna styloid fracture. Carpal bones are intact with degenerative
change at the trapezium and scaphoid. There is chondrocalcinosis.
Generalized soft tissue edema.
IMPRESSION: 1. Comminuted impacted distal radius fracture extending into the
radiocarpal and distal radioulnar joints. Slight offset of the
distal radioulnar joint.
2. Minimally displaced ulna styloid fracture.

## 2018-12-18 MED ORDER — OXYCODONE-ACETAMINOPHEN 5-325 MG PO TABS
1.0000 | ORAL_TABLET | Freq: Once | ORAL | Status: AC
  Administered 2018-12-18: 22:00:00 1 via ORAL
  Filled 2018-12-18: qty 1

## 2018-12-18 NOTE — ED Provider Notes (Signed)
[facility] Emergency Department Provider Note  ____________________________________________  Time seen: Approximately 9:34 PM  I have reviewed the triage vital signs and the nursing notes.   HISTORY  Chief Complaint Fall and Wrist Pain    HPI [name] is a 73 y.o. female who presents the emergency department complaining of a wrist injury to the right wrist.  Patient reports that she was folding laundry yesterday when she leaned over on one extremity to reach something.  Patient reports that she lost her balance and fell on her outstretched hand.  Patient has had pain, swelling to the wrist.  Initially she thought she had sprained the wrist but pain has persisted and swelling has worsened.  Patient did hit her head but did not lose consciousness.  She has had no headache, visual changes, neck pain, chest pain, shortness of breath, domino pain, nausea vomiting.  Patient is concerned for her wrist only at this time.         Past Medical History:  Diagnosis Date  . Abnormal Pap smear of cervix    Dr. Prentice Docker   . Coronary artery disease    a. 06/2009 MI/PCI to LCX and LAD; b. 01/2011 Occluded stents-->CABG x 3 ([facility]): LIMA->LAD, VG->LCX, VG->RPDA; c. 01/2012 Cath: LM 50, LAD 95ost, 144m, LCX 95ost, 154m, RCA 58m, RPDA small, LIMA->LAD ok, VG->LCX ok, VG->RPDA 100-->Med rx.  . Degenerative disc disease, lumbar    L4/5 noted CT ab/pelvis 05/19/11   . Hyperlipidemia   . Hypertension    under control  . Macular degeneration   . Parathyroid adenoma    right 13 x 8 mm s/p resection   . PVD (peripheral vascular disease) ([place])    a. 01/2010 s/p Aortofemoral bypass.    Patient Active Problem List   Diagnosis Date Noted  . Former smoker 12/07/2018  . CAD (coronary artery disease), native coronary artery 01/23/2018  . Cholecystitis   . URI (upper respiratory infection) 06/29/2017  . Abnormal Pap smear of cervix 04/22/2017  . Cervical high risk HPV  (human papillomavirus) test positive 04/22/2017  . DDD (degenerative disc disease), lumbar   . Closed fracture of ankle, trimalleolar 10/08/2016  . Abnormal Pap smear of vagina 05/05/2016  . Nausea without vomiting 03/26/2016  . Generalized anxiety disorder 01/21/2016  . Abdominal distension 01/21/2016  . Obesity 12/19/2015  . Hypercalcemia 10/03/2015  . Need for hepatitis C screening test 10/03/2015  . Encounter for routine adult medical exam with abnormal findings 01/17/2015  . Lipoma of arm 12/25/2014  . Macular degeneration 12/25/2014  . Medicare annual wellness visit, subsequent 01/04/2014  . Vaginal irritation 12/22/2013  . Osteoporosis 03/07/2013  . Chronic back pain 03/07/2013  . Weight gain 09/20/2012  . Essential hypertension, benign 05/31/2012  . Coronary artery disease 05/31/2012  . GERD (gastroesophageal reflux disease) 05/31/2012  . Allergic rhinitis 05/31/2012  . Primary hyperparathyroidism ([place]) 05/31/2012  . General medical exam 11/19/2011  . Neuropathy 11/19/2011  . PVD (peripheral vascular disease) ([place]) 07/30/2010  . Hyperlipidemia 03/26/2010  . CAD, AUTOLOGOUS BYPASS GRAFT 03/26/2010  . MULTI-VESSEL OR BILATERAL STENOSIS W/O INFARCTION 03/26/2010  . TOBACCO USE, QUIT 03/26/2010    Past Surgical History:  Procedure Laterality Date  . CARDIAC CATHETERIZATION  2013   [facility]  . CATARACT EXTRACTION    . COLPOSCOPY    . CORONARY ARTERY BYPASS GRAFT  02/07/2010   x 3  . FACIAL COSMETIC SURGERY  2013   Dr. Nadeen Landau  . FOOT SURGERY  right  . ILIO-FEMORAL BYPASS GRAFT  2011  . MITRAL VALVE REPAIR  01/2010  . ORIF ANKLE FRACTURE Right 10/08/2016   Procedure: OPEN REDUCTION INTERNAL FIXATION (ORIF) ANKLE FRACTURE;  Surgeon: Corky Mull, MD;  Location: [facility] ORS;  Service: Orthopedics;  Laterality: Right;  . PARATHYROIDECTOMY    . TUBAL LIGATION      Prior to Admission medications   Medication Sig Start Date End Date Taking? Authorizing Provider   ACIDOPHILUS LACTOBACILLUS PO Take 1 tablet by mouth daily.    [provider]  aspirin 81 MG EC tablet Take 81 mg by mouth daily.     [provider]  Cholecalciferol (VITAMIN D3) 1000 UNITS CAPS Take 1,000 Units by mouth daily.     [provider]  Coenzyme Q10 100 MG capsule Take 100 mg by mouth daily.     [provider]  ezetimibe (ZETIA) 10 MG tablet TAKE 1 TABLET BY MOUTH ONCE DAILY. 10/05/18   Minna Merritts, MD  fluticasone (FLONASE) 50 MCG/ACT nasal spray Place 1 spray into both nostrils daily.    [provider]  HYDROcodone-acetaminophen (NORCO) 5-325 MG tablet Take 1 tablet by mouth every 6 (six) hours as needed for up to 9 doses for severe pain. 08/04/17   Darel Hong, MD  IRON PO Take 325 mg by mouth daily.     [provider]  Astrid Drafts 350 MG CAPS Take 1 capsule by mouth daily.    [provider]  metoprolol tartrate (LOPRESSOR) 25 MG tablet TAKE 1/2 TABLET BY MOUTH TWICE DAILY 10/05/18   Minna Merritts, MD  pantoprazole (PROTONIX) 40 MG tablet TAKE 1 TABLET BY MOUTH ONCE DAILY. 10/17/18   Burnard Hawthorne, FNP  pantoprazole (PROTONIX) 40 MG tablet TAKE ONE (1) TABLET BY MOUTH EVERY DAY 10/10/18   Burnard Hawthorne, FNP  Probiotic Product (RISA-BID PROBIOTIC PO) Take 250 mg by mouth 2 (two) times daily.     [provider]  rosuvastatin (CRESTOR) 40 MG tablet TAKE 1 TABLET BY MOUTH ONCE DAILY 11/24/18   Minna Merritts, MD  sertraline (ZOLOFT) 100 MG tablet Take 1 tablet (100 mg total) by mouth daily. 12/12/18   Burnard Hawthorne, FNP    Allergies Patient has no known allergies.  Family History  Problem Relation Age of Onset  . Heart disease Father     Social History Social History   Tobacco Use  . Smoking status: Former Smoker    Packs/day: 1.00    Years: 40.00    Pack years: 40.00    Types: Cigarettes    Quit date: 07/15/2009    Years since quitting: 9.4  . Smokeless tobacco: Never  Used  . Tobacco comment: quit 06/2009 smoked from 20s to 2011 1.5 ppd no FH lung cancer   Substance Use Topics  . Alcohol use: Yes    Alcohol/week: 1.0 standard drinks    Types: 1 Standard drinks or equivalent per week    Comment: occ.  . Drug use: No     Review of Systems  Constitutional: No fever/chills Eyes: No visual changes. No discharge ENT: No upper respiratory complaints. Cardiovascular: no chest pain. Respiratory: no cough. No SOB. Gastrointestinal: No abdominal pain.  No nausea, no vomiting.  No diarrhea.  No constipation. Musculoskeletal: Positive for right wrist pain/injury Skin: Negative for rash, abrasions, lacerations, ecchymosis. Neurological: Negative for headaches, focal weakness or numbness. 10-point ROS otherwise negative.  ____________________________________________   PHYSICAL EXAM:  VITAL SIGNS:  ED Triage Vitals  Enc Vitals Group     BP 12/18/18 2042 116/65     Pulse Rate 12/18/18 2042 75     Resp 12/18/18 2042 20     Temp 12/18/18 2042 98.1 F (36.7 C)     Temp Source 12/18/18 2042 Oral     SpO2 12/18/18 2042 98 %     Weight 12/18/18 2042 142 lb (64.4 kg)     Height 12/18/18 2042 5' (1.524 m)     Head Circumference --      Peak Flow --      Pain Score 12/18/18 2041 9     Pain Loc --      Pain Edu? --      Excl. in [place]? --      Constitutional: Alert and oriented. Well appearing and in no acute distress. Eyes: Conjunctivae are normal. PERRL. EOMI. Head: Atraumatic. ENT:      Ears:       Nose: No congestion/rhinnorhea.      Mouth/Throat: Mucous membranes are moist.  Neck: No stridor.    Cardiovascular: Normal rate, regular rhythm. Normal S1 and S2.  Good peripheral circulation. Respiratory: Normal respiratory effort without tachypnea or retractions. Lungs CTAB. Good air entry to the bases with no decreased or absent breath sounds. Musculoskeletal: Full range of motion to all extremities. No gross deformities appreciated.  Visualization of  the right wrist reveals deformity/edema to the distal radius and ulna region.  Patient with limited range of motion to the wrist.  On palpation, no palpable deformity is appreciated no gross edema is.  Significant tenderness to palpation over the distal radius.  Radial pulse intact.  Sensation intact all digits. Neurologic:  Normal speech and language. No gross focal neurologic deficits are appreciated.  Skin:  Skin is warm, dry and intact. No rash noted. Psychiatric: Mood and affect are normal. Speech and behavior are normal. Patient exhibits appropriate insight and judgement.   ____________________________________________   LABS (all labs ordered are listed, but only abnormal results are displayed)  Labs Reviewed - No data to display ____________________________________________  EKG   ____________________________________________  RADIOLOGY I personally viewed and evaluated these images as part of my medical decision making, as well as reviewing the written report by the radiologist.  I concur with radiologist finding of comminuted impacted distal radius fracture.  No significant angulation appreciated.  Dg Wrist Complete Right  Result Date: 12/18/2018 CLINICAL DATA:  Right wrist pain after fall taking off pants. EXAM: RIGHT WRIST - COMPLETE 3+ VIEW COMPARISON:  None. FINDINGS: Comminuted impacted fracture of the distal radius extending into the radiocarpal and distal radioulnar joint. No significant angulation. Slight offset of the distal radioulnar joint. Minimally displaced ulna styloid fracture. Carpal bones are intact with degenerative change at the trapezium and scaphoid. There is chondrocalcinosis. Generalized soft tissue edema. IMPRESSION: 1. Comminuted impacted distal radius fracture extending into the radiocarpal and distal radioulnar joints. Slight offset of the distal radioulnar joint. 2. Minimally displaced ulna styloid fracture. Electronically Signed   By: Keith Rake M.D.    On: 12/18/2018 21:04    ____________________________________________    PROCEDURES  Procedure(s) performed:    .Splint Application  Date/Time: 12/18/2018 9:40 PM Performed by: Darletta Moll, PA-C Authorized by: Darletta Moll, PA-C   Consent:    Consent obtained:  Verbal   Consent given by:  Patient   Risks discussed:  Pain and swelling Pre-procedure details:    Sensation:  Normal Procedure details:  Laterality:  Right   Location:  Wrist   Wrist:  R wrist   Splint type:  Volar short arm   Supplies:  Cotton padding, Ortho-Glass and elastic bandage Post-procedure details:    Pain:  Improved   Sensation:  Normal   Patient tolerance of procedure:  Tolerated well, no immediate complications      Medications  oxyCODONE-acetaminophen (PERCOCET/ROXICET) 5-325 MG per tablet 1 tablet (has no administration in time range)     ____________________________________________   INITIAL IMPRESSION / ASSESSMENT AND PLAN / ED COURSE  Pertinent labs & imaging results that were available during my care of the patient were reviewed by me and considered in my medical decision making (see chart for details).  Review of the [place] CSRS was performed in accordance of the [place] prior to dispensing any controlled drugs.           Patient's diagnosis is consistent with right wrist fracture.  Patient presents emergency department with an injury to her right wrist.  Patient had a comminuted, impacted her radius fracture.  On exam, sensation intact.  Patient will be splinted as described above.  Patient is on chronic pain medication and I have instructed her to contact her prescriber to increase or change pain medication to help cover increased pain.  No controlled substance prescription at this time.  Patient is to follow-up with orthopedics..  Patient is given ED precautions to return to the ED for any worsening or new  symptoms.     ____________________________________________  FINAL CLINICAL IMPRESSION(S) / ED DIAGNOSES  Final diagnoses:  Other closed fracture of distal end of right radius, initial encounter      NEW MEDICATIONS STARTED DURING THIS VISIT:  ED Discharge Orders    None          This chart was dictated using voice recognition software/Dragon. Despite best efforts to proofread, errors can occur which can change the meaning. Any change was purely unintentional.    Darletta Moll, PA-C 12/18/18 2143    Schuyler Amor, MD 12/18/18 2145

## 2018-12-18 NOTE — ED Notes (Signed)
Pt verbalized understanding of discharge instructions. NAD at this time. 

## 2018-12-18 NOTE — ED Triage Notes (Signed)
Patient reports she stood to take off pants and lost her balance and fell.  Patient reports right wrist pain as reason for coming to the ED.  Patient with noted bruising to right forehead, denies loss of consciousness.

## 2018-12-27 ENCOUNTER — Telehealth: Payer: Self-pay | Admitting: Cardiovascular Disease

## 2018-12-27 DIAGNOSIS — M25531 Pain in right wrist: Secondary | ICD-10-CM | POA: Diagnosis not present

## 2018-12-27 DIAGNOSIS — S52501A Unspecified fracture of the lower end of right radius, initial encounter for closed fracture: Secondary | ICD-10-CM | POA: Diagnosis not present

## 2018-12-27 NOTE — Telephone Encounter (Signed)
° °  [facility] Pre-operative Risk Assessment    Request for surgical clearance:  1. What type of surgery is being performed? Open reduction internal fixation of right wrist  2. When is this surgery scheduled? 12/29/18  3. What type of clearance is required (medical clearance vs. Pharmacy clearance to hold med vs. Both)? both  4. Are there any medications that need to be held prior to surgery and how long? Advise on any blood thinners  5. Practice name and name of physician performing surgery? Dr. Roland Rack at [facility]  6. What is your office phone number [phone]   7.   What is your office fax number [phone]  8.   Anesthesia type (None, local, MAC, general) ? Not noted   Marykay Lex 12/27/2018, 3:34 PM  _________________________________________________________________   (provider comments below)

## 2018-12-28 NOTE — Telephone Encounter (Signed)
Surgery is tomorrow, I attempted to contact the patient this morning around 10AM twice and left a message for her to call back and speak to the preop APP. I attempted to reach Mrs. [name] a third time around noon, still no success (the call goes straight into voice mail). Given the urgency of the surgery, will attempt to call her again in 1 hour.

## 2018-12-28 NOTE — Telephone Encounter (Signed)
Follow up   [name] is calling she has another question in referemnce to her medical clearance about her medication. Please call.

## 2018-12-28 NOTE — Telephone Encounter (Signed)
I have called Mrs. Fletes and all questions answered

## 2018-12-28 NOTE — Telephone Encounter (Signed)
   Primary Cardiologist: Ida Rogue, MD  Chart reviewed as part of pre-operative protocol coverage. Patient was contacted 12/28/2018 in reference to pre-operative risk assessment for pending surgery as outlined below.  [name] was last seen on 01/24/2018 by Dr. Rockey Situ.  Since that day, [name] has done well without any chest pain or shortness. She has been fairly active unless the recent fall.   Therefore, based on ACC/AHA guidelines, the patient would be at acceptable risk for the planned procedure without further cardiovascular testing.   I will route this recommendation to the requesting party via Epic fax function and remove from pre-op pool.  Please call with questions. She will hold aspirin today and tomorrow. It is recommended to continue metoprolol through the surgery. She may resume aspirin after the surgery once the bleeding risk is low.   [place], [place] 12/28/2018, 1:23 PM

## 2018-12-28 NOTE — Telephone Encounter (Signed)
Faxed via Epic to the requesting office. Called and spoke with Radene Ou she informed me she will check the fax and that she can access Epic system so as long as the clearance is in the patient's chart the clearance can be seen.

## 2018-12-29 ENCOUNTER — Encounter
Admission: RE | Disposition: A | Discharge status: Home / Self Care | Payer: Self-pay | Source: Home / Self Care | Attending: Surgery

## 2018-12-29 ENCOUNTER — Ambulatory Visit: Payer: Medicare HMO | Admitting: Anesthesiology

## 2018-12-29 ENCOUNTER — Encounter: Payer: Self-pay | Admitting: *Deleted

## 2018-12-29 ENCOUNTER — Ambulatory Visit: Payer: Medicare HMO

## 2018-12-29 ENCOUNTER — Other Ambulatory Visit: Payer: Self-pay

## 2018-12-29 ENCOUNTER — Other Ambulatory Visit
Admission: RE | Admit: 2018-12-29 | Discharge: 2018-12-29 | Disposition: A | Discharge status: Home / Self Care | Payer: Medicare HMO | Source: Ambulatory Visit | Attending: Surgery | Admitting: Surgery

## 2018-12-29 ENCOUNTER — Ambulatory Visit
Admission: RE | Admit: 2018-12-29 | Discharge: 2018-12-29 | Disposition: A | Discharge status: Home / Self Care | Payer: Medicare HMO | Attending: Surgery | Admitting: Surgery

## 2018-12-29 DIAGNOSIS — S52551A Other extraarticular fracture of lower end of right radius, initial encounter for closed fracture: Secondary | ICD-10-CM | POA: Diagnosis not present

## 2018-12-29 DIAGNOSIS — I2510 Atherosclerotic heart disease of native coronary artery without angina pectoris: Secondary | ICD-10-CM | POA: Diagnosis not present

## 2018-12-29 DIAGNOSIS — G8918 Other acute postprocedural pain: Secondary | ICD-10-CM | POA: Diagnosis not present

## 2018-12-29 DIAGNOSIS — Z87891 Personal history of nicotine dependence: Secondary | ICD-10-CM | POA: Insufficient documentation

## 2018-12-29 DIAGNOSIS — M25531 Pain in right wrist: Secondary | ICD-10-CM | POA: Diagnosis not present

## 2018-12-29 DIAGNOSIS — S52611A Displaced fracture of right ulna styloid process, initial encounter for closed fracture: Secondary | ICD-10-CM | POA: Diagnosis present

## 2018-12-29 DIAGNOSIS — S52501A Unspecified fracture of the lower end of right radius, initial encounter for closed fracture: Secondary | ICD-10-CM | POA: Diagnosis not present

## 2018-12-29 DIAGNOSIS — Z7982 Long term (current) use of aspirin: Secondary | ICD-10-CM | POA: Diagnosis not present

## 2018-12-29 DIAGNOSIS — K219 Gastro-esophageal reflux disease without esophagitis: Secondary | ICD-10-CM | POA: Insufficient documentation

## 2018-12-29 DIAGNOSIS — W19XXXA Unspecified fall, initial encounter: Secondary | ICD-10-CM | POA: Insufficient documentation

## 2018-12-29 DIAGNOSIS — I10 Essential (primary) hypertension: Secondary | ICD-10-CM | POA: Diagnosis not present

## 2018-12-29 DIAGNOSIS — H3530 Unspecified macular degeneration: Secondary | ICD-10-CM | POA: Insufficient documentation

## 2018-12-29 DIAGNOSIS — Z79899 Other long term (current) drug therapy: Secondary | ICD-10-CM | POA: Diagnosis not present

## 2018-12-29 DIAGNOSIS — I739 Peripheral vascular disease, unspecified: Secondary | ICD-10-CM | POA: Diagnosis not present

## 2018-12-29 DIAGNOSIS — Z20828 Contact with and (suspected) exposure to other viral communicable diseases: Secondary | ICD-10-CM | POA: Insufficient documentation

## 2018-12-29 DIAGNOSIS — E785 Hyperlipidemia, unspecified: Secondary | ICD-10-CM | POA: Insufficient documentation

## 2018-12-29 DIAGNOSIS — Z951 Presence of aortocoronary bypass graft: Secondary | ICD-10-CM | POA: Insufficient documentation

## 2018-12-29 DIAGNOSIS — S52531A Colles' fracture of right radius, initial encounter for closed fracture: Secondary | ICD-10-CM | POA: Diagnosis not present

## 2018-12-29 DIAGNOSIS — M5136 Other intervertebral disc degeneration, lumbar region: Secondary | ICD-10-CM | POA: Insufficient documentation

## 2018-12-29 HISTORY — PX: ORIF WRIST FRACTURE: SHX2133

## 2018-12-29 LAB — SARS CORONAVIRUS 2 BY RT PCR (HOSPITAL ORDER, PERFORMED IN ~~LOC~~ HOSPITAL LAB): SARS Coronavirus 2: NEGATIVE

## 2018-12-29 SURGERY — OPEN REDUCTION INTERNAL FIXATION (ORIF) WRIST FRACTURE
Anesthesia: General | Site: Wrist | Laterality: Right

## 2018-12-29 MED ORDER — LIDOCAINE HCL (PF) 2 % IJ SOLN
INTRAMUSCULAR | Status: AC
  Filled 2018-12-29: qty 10

## 2018-12-29 MED ORDER — FENTANYL CITRATE (PF) 100 MCG/2ML IJ SOLN
INTRAMUSCULAR | Status: AC
  Filled 2018-12-29: qty 2

## 2018-12-29 MED ORDER — MIDAZOLAM HCL 2 MG/2ML IJ SOLN
INTRAMUSCULAR | Status: DC | PRN
  Administered 2018-12-29 (×2): 1 mg via INTRAVENOUS

## 2018-12-29 MED ORDER — FENTANYL CITRATE (PF) 100 MCG/2ML IJ SOLN
50.0000 ug | Freq: Once | INTRAMUSCULAR | Status: AC
  Administered 2018-12-29: 10:00:00 50 ug via INTRAVENOUS

## 2018-12-29 MED ORDER — PHENYLEPHRINE HCL (PRESSORS) 10 MG/ML IV SOLN
INTRAVENOUS | Status: DC | PRN
  Administered 2018-12-29: 100 ug via INTRAVENOUS
  Administered 2018-12-29 (×2): 50 ug via INTRAVENOUS

## 2018-12-29 MED ORDER — ROPIVACAINE HCL 5 MG/ML IJ SOLN
INTRAMUSCULAR | Status: DC | PRN
  Administered 2018-12-29: 20 mL via PERINEURAL
  Administered 2018-12-29: 10 mL via PERINEURAL

## 2018-12-29 MED ORDER — BUPIVACAINE HCL (PF) 0.5 % IJ SOLN
INTRAMUSCULAR | Status: DC | PRN
  Administered 2018-12-29: 10 mL

## 2018-12-29 MED ORDER — PROPOFOL 500 MG/50ML IV EMUL
INTRAVENOUS | Status: AC
  Filled 2018-12-29: qty 50

## 2018-12-29 MED ORDER — HYDROCODONE-ACETAMINOPHEN 7.5-325 MG PO TABS
ORAL_TABLET | ORAL | Status: AC
  Administered 2018-12-29: 15:00:00 1 via ORAL
  Filled 2018-12-29: qty 1

## 2018-12-29 MED ORDER — OXYCODONE HCL 5 MG PO TABS: 5.0000 mg | ORAL_TABLET | Freq: Once | ORAL | Status: DC | PRN

## 2018-12-29 MED ORDER — HYDROCODONE-ACETAMINOPHEN 7.5-325 MG PO TABS
1.0000 | ORAL_TABLET | Freq: Once | ORAL | Status: AC
  Administered 2018-12-29: 15:00:00 1 via ORAL
  Filled 2018-12-29: qty 1

## 2018-12-29 MED ORDER — MIDAZOLAM HCL 2 MG/2ML IJ SOLN
INTRAMUSCULAR | Status: AC
  Filled 2018-12-29: qty 2

## 2018-12-29 MED ORDER — MIDAZOLAM HCL 2 MG/2ML IJ SOLN
INTRAMUSCULAR | Status: AC
  Administered 2018-12-29: 10:00:00 1 mg via INTRAVENOUS
  Filled 2018-12-29: qty 2

## 2018-12-29 MED ORDER — CEFAZOLIN SODIUM-DEXTROSE 2-4 GM/100ML-% IV SOLN
2.0000 g | Freq: Once | INTRAVENOUS | Status: AC
  Administered 2018-12-29: 2 g via INTRAVENOUS

## 2018-12-29 MED ORDER — FENTANYL CITRATE (PF) 100 MCG/2ML IJ SOLN
INTRAMUSCULAR | Status: DC | PRN
  Administered 2018-12-29: 50 ug via INTRAVENOUS

## 2018-12-29 MED ORDER — FAMOTIDINE 20 MG PO TABS
ORAL_TABLET | ORAL | Status: AC
  Filled 2018-12-29: qty 1

## 2018-12-29 MED ORDER — LACTATED RINGERS IV SOLN
INTRAVENOUS | Status: DC
  Administered 2018-12-29: 17:00:00 via INTRAVENOUS

## 2018-12-29 MED ORDER — HYDROCODONE-ACETAMINOPHEN 7.5-325 MG PO TABS: 1.0000 | ORAL_TABLET | Freq: Four times a day (QID) | ORAL | 0 refills | Status: DC | PRN

## 2018-12-29 MED ORDER — MIDAZOLAM HCL 2 MG/2ML IJ SOLN
1.0000 mg | Freq: Once | INTRAMUSCULAR | Status: AC
  Administered 2018-12-29: 10:00:00 1 mg via INTRAVENOUS

## 2018-12-29 MED ORDER — FENTANYL CITRATE (PF) 100 MCG/2ML IJ SOLN: 25.0000 ug | INTRAMUSCULAR | Status: DC | PRN

## 2018-12-29 MED ORDER — LIDOCAINE HCL (PF) 1 % IJ SOLN
INTRAMUSCULAR | Status: AC
  Filled 2018-12-29: qty 5

## 2018-12-29 MED ORDER — OXYCODONE HCL 5 MG/5ML PO SOLN: 5.0000 mg | Freq: Once | ORAL | Status: DC | PRN

## 2018-12-29 MED ORDER — PROPOFOL 500 MG/50ML IV EMUL
INTRAVENOUS | Status: DC | PRN
  Administered 2018-12-29: 75 ug/kg/min via INTRAVENOUS

## 2018-12-29 MED ORDER — LIDOCAINE HCL (PF) 1 % IJ SOLN
INTRAMUSCULAR | Status: DC | PRN
  Administered 2018-12-29: 1 mL via INTRADERMAL

## 2018-12-29 MED ORDER — EPHEDRINE SULFATE 50 MG/ML IJ SOLN
INTRAMUSCULAR | Status: DC | PRN
  Administered 2018-12-29: 10 mg via INTRAVENOUS

## 2018-12-29 MED ORDER — CEFAZOLIN SODIUM-DEXTROSE 2-4 GM/100ML-% IV SOLN
INTRAVENOUS | Status: AC
  Filled 2018-12-29: qty 100

## 2018-12-29 MED ORDER — FENTANYL CITRATE (PF) 100 MCG/2ML IJ SOLN
INTRAMUSCULAR | Status: AC
  Administered 2018-12-29: 10:00:00 50 ug via INTRAVENOUS
  Filled 2018-12-29: qty 2

## 2018-12-29 SURGICAL SUPPLY — 55 items
BIT DRILL 2.2 SS TIBIAL (BIT) ×2 IMPLANT
BNDG COHESIVE 4X5 TAN STRL (GAUZE/BANDAGES/DRESSINGS) ×2 IMPLANT
BNDG ESMARK 4X12 TAN STRL LF (GAUZE/BANDAGES/DRESSINGS) ×2 IMPLANT
CANISTER SUCT 1200ML W/VALVE (MISCELLANEOUS) ×2 IMPLANT
CAST PADDING 3X4FT ST 30246 (SOFTGOODS) ×1
CHLORAPREP W/TINT 26 (MISCELLANEOUS) ×4 IMPLANT
CORD BIP STRL DISP 12FT (MISCELLANEOUS) IMPLANT
COVER WAND RF STERILE (DRAPES) IMPLANT
CUFF TOURN SGL QUICK 18X4 (TOURNIQUET CUFF) IMPLANT
DRAPE FLUOR MINI C-ARM 54X84 (DRAPES) ×2 IMPLANT
DRAPE SPLIT 6X30 W/TAPE (DRAPES) ×2 IMPLANT
DRAPE SURG 17X11 SM STRL (DRAPES) ×2 IMPLANT
DRAPE U-SHAPE 47X51 STRL (DRAPES) ×2 IMPLANT
ELECT CAUTERY BLADE 6.4 (BLADE) ×2 IMPLANT
ELECT REM PT RETURN 9FT ADLT (ELECTROSURGICAL) ×2
ELECTRODE REM PT RTRN 9FT ADLT (ELECTROSURGICAL) ×1 IMPLANT
FORCEPS JEWEL BIP 4-3/4 STR (INSTRUMENTS) IMPLANT
GAUZE SPONGE 4X4 12PLY STRL (GAUZE/BANDAGES/DRESSINGS) ×2 IMPLANT
GAUZE XEROFORM 1X8 LF (GAUZE/BANDAGES/DRESSINGS) ×2 IMPLANT
GLOVE BIO SURGEON STRL SZ8 (GLOVE) ×2 IMPLANT
GLOVE INDICATOR 8.0 STRL GRN (GLOVE) ×2 IMPLANT
GOWN STRL REUS W/ TWL LRG LVL3 (GOWN DISPOSABLE) ×1 IMPLANT
GOWN STRL REUS W/ TWL XL LVL3 (GOWN DISPOSABLE) ×1 IMPLANT
GOWN STRL REUS W/TWL LRG LVL3 (GOWN DISPOSABLE) ×1
GOWN STRL REUS W/TWL XL LVL3 (GOWN DISPOSABLE) ×1
K-WIRE 1.6 (WIRE) ×1
K-WIRE FX5X1.6XNS BN SS (WIRE) ×1
KIT TURNOVER KIT A (KITS) ×2 IMPLANT
KWIRE FX5X1.6XNS BN SS (WIRE) ×1 IMPLANT
NEEDLE FILTER BLUNT 18X 1/2SAF (NEEDLE) ×1
NEEDLE FILTER BLUNT 18X1 1/2 (NEEDLE) ×1 IMPLANT
NS IRRIG 500ML POUR BTL (IV SOLUTION) ×2 IMPLANT
PACK EXTREMITY ARMC (MISCELLANEOUS) ×2 IMPLANT
PAD CAST CTTN 3X4 STRL (SOFTGOODS) ×1 IMPLANT
PAD CAST CTTN 4X4 STRL (SOFTGOODS) IMPLANT
PADDING CAST COTTON 4X4 STRL (SOFTGOODS)
PEG LOCKING SMOOTH 2.2X15 (Peg) ×2 IMPLANT
PEG LOCKING SMOOTH 2.2X16 (Screw) ×6 IMPLANT
PEG LOCKING SMOOTH 2.2X18 (Peg) ×4 IMPLANT
PLATE CROSSLOCK NAR MINI RT (Plate) ×2 IMPLANT
SCREW  LP NL 2.7X13MM (Screw) ×1 IMPLANT
SCREW 2.7X12MM (Screw) ×2 IMPLANT
SCREW LOCK 12X2.7X 3 LD (Screw) ×1 IMPLANT
SCREW LOCKING 2.7X12MM (Screw) ×1 IMPLANT
SCREW LP NL 2.7X13MM (Screw) ×1 IMPLANT
SCREW NONLOCK 2.7X18MM (Screw) ×2 IMPLANT
SPLINT CAST 1 STEP 3X12 (MISCELLANEOUS) ×2 IMPLANT
STAPLER SKIN PROX 35W (STAPLE) ×2 IMPLANT
STOCKINETTE IMPERVIOUS 9X36 MD (GAUZE/BANDAGES/DRESSINGS) ×2 IMPLANT
SUT PROLENE 4 0 PS 2 18 (SUTURE) ×2 IMPLANT
SUT VIC AB 2-0 SH 27 (SUTURE) ×1
SUT VIC AB 2-0 SH 27XBRD (SUTURE) ×1 IMPLANT
SUT VIC AB 3-0 SH 27 (SUTURE) ×2
SUT VIC AB 3-0 SH 27X BRD (SUTURE) ×2 IMPLANT
SYR 10ML LL (SYRINGE) ×2 IMPLANT

## 2018-12-29 NOTE — Transfer of Care (Signed)
Immediate Anesthesia Transfer of Care Note  Patient: [name]  Procedure(s) Performed: OPEN REDUCTION INTERNAL FIXATION (ORIF) WRIST FRACTURE (Right Wrist)  Patient Location: PACU  Anesthesia Type:General  Level of Consciousness: awake, alert  and oriented  Airway & Oxygen Therapy: Patient Spontanous Breathing  Post-op Assessment: Report given to RN and Post -op Vital signs reviewed and stable  Post vital signs: Reviewed and stable  Last Vitals:  Vitals Value Taken Time  BP 116/74 12/29/18 1850  Temp 36.3 C 12/29/18 1850  Pulse 78 12/29/18 1850  Resp 27 12/29/18 1850  SpO2 95 % 12/29/18 1850  Vitals shown include unvalidated device data.  Last Pain:  Vitals:   12/29/18 0923  TempSrc: Oral         Complications: No apparent anesthesia complications

## 2018-12-29 NOTE — Anesthesia Post-op Follow-up Note (Signed)
Anesthesia QCDR form completed.        

## 2018-12-29 NOTE — Op Note (Signed)
12/29/2018  6:48 PM  Patient:   [name]  Pre-Op Diagnosis:   Closed acute extra-articular distal radius fracture with ulnar styloid fracture, right wrist.  Post-Op Diagnosis:   Same.  Procedure:   Open reduction and internal fixation of right distal radius fracture.  Surgeon:   Pascal Lux, MD  Assistant:   Orland Penman, PA-S  Anesthesia:   Supraclavicular block with IV sedation  Findings:   As above.  Complications:   None  EBL:   5 cc  Fluids:   1400 cc crystalloid  TT:   56 minutes at 250 mmHg  Drains:   None  Closure:   3-0 Vicryl subcuticular sutures  Implants:   Biomet DVR Cross-locked narrow precontoured distal radius mini-locking plate.  Brief Clinical Note:   The patient is a 73 year old female who sustained the above-noted injury 11 days ago when she apparently tripped over her dog and fell onto her outstretched right hand. She was seen in the emergency room and a splint was applied. She was referred to orthopedics and presents at this time for definitive management of her injury.  Procedure:   The patient underwent placement of a supraclavicular block by the anesthesiologist in the preoperative holding area before she was brought into the operating room and lain in the supine position. After adequate IV sedation was obtained, the patient's right hand and upper extremity were prepped with ChloraPrep solution before being draped sterilely. Preoperative antibiotics were administered. A timeout was performed to verify the appropriate surgical site before the limb was exsanguinated with an Esmarch and the tourniquet inflated to 250 mmHg. An approximately 7-8 cm incision was made over the volar aspect of the distal radius beginning at the volar flexion crease and extending proximally along the flexor carpi radialis tendon. The incision was carried down through the subcutaneous tissues to expose the superficial retinaculum. This was split the length of the incision  directly over the flexor carpi radialis tendon. The FCR sheath was opened and the tendon retracted ulnarly to protect the median nerve. The floor of the FCR sheath was opened to expose the pronator quadratus muscle. This was released along the radial insertion and the muscle was retracted ulnarly to expose the distal radius. The fracture was identified and soft tissues elevated off the distal metaphyseal region for several centimeters. The appropriate sized plate was selected and positioned on the distal radius. A guidewire was placed through the distal hole and its position verified using FluoroScan imaging in AP and lateral projections. After several attempts, the pin was positioned parallel to the distal articular surface and approximately 3-4 mm proximal to the articular surface. A second K wire was used to center the proximal end of the plate over the radial shaft.   Distally, a nonlocking cortical screw was placed in the ulnar most hole of the more proximal row. The other holes were filled with locking pegs of the appropriate lengths.  The plate was carefully lowered onto the volar metaphyseal surface, reducing the fracture in the process. Again the position of the plate was verified using FluoroScan imaging in AP and lateral projections and found to be excellent. The plate was secured using three nonlocking bicortical screws proximally. The adequacy of hardware position and fracture reduction was verified using FluoroScan imaging in AP, lateral, and several additional oblique projections to be sure that the hardware did not enter the joint nor did it penetrate dorsally. The distal ulnar cortical screw was felt to be slightly long,  so it was removed and a shorter smooth locking peg positioned. Again the construct was assessed using FluoroScan imaging in AP, lateral, and oblique projections and found to be excellent.  The wound was copiously irrigated with sterile saline solution before the pronator  quadratus was reapproximated using 2-0 Vicryl interrupted sutures. The subcutaneous tissues were closed using 2-0 Vicryl interrupted sutures before the subcuticular layer was closed using 3-0 Vicryl inverted interrupted sutures. Benzoin and Steri-Strips are applied to the skin. A total of 10 cc of 0.5% plain Sensorcaine was injected in and around the incision site to help with postoperative analgesia before a sterile bulky dressing was applied to the wound. The patient was placed into a volar splint maintaining the wrist in neutral position before the patient was awakened and returned to the recovery room in satisfactory condition after tolerating the procedure well.

## 2018-12-29 NOTE — Progress Notes (Signed)
Delete, actual time 09:35

## 2018-12-29 NOTE — H&P (Signed)
Paper H&P to be scanned into permanent record. H&P reviewed and patient re-examined. No changes. 

## 2018-12-29 NOTE — Anesthesia Procedure Notes (Signed)
Anesthesia Regional Block: Supraclavicular block   Pre-Anesthetic Checklist: ,, timeout performed, Correct Patient, Correct Site, Correct Laterality, Correct Procedure, Correct Position, site marked, Risks and benefits discussed,  Surgical consent,  Pre-op evaluation,  At surgeon's request and post-op pain management  Laterality: Upper and Right  Prep: chloraprep       Needles:  Injection technique: Single-shot  Needle Type: Stimiplex     Needle Length: 5cm  Needle Gauge: 22     Additional Needles:   Procedures:,,,, ultrasound used (permanent image in chart),,,,  Narrative:  Start time: 12/29/2018 9:36 AM End time: 12/29/2018 9:39 AM Injection made incrementally with aspirations every 5 mL.  Performed by: Personally  Anesthesiologist: Eddy Termine, Precious Haws, MD  Additional Notes: Patient consented for risk and benefits of nerve block including but not limited to nerve damage, failed block, bleeding and infection.  Patient voiced understanding.  Functioning IV was confirmed and monitors were applied.  A 47mm 22ga Stimuplex needle was used. Sterile prep,hand hygiene and sterile gloves were used.  Minimal sedation used for procedure.  No paresthesia endorsed by patient during the procedure.  Negative aspiration and negative test dose prior to incremental administration of local anesthetic. The patient tolerated the procedure well with no immediate complications.

## 2018-12-29 NOTE — Anesthesia Preprocedure Evaluation (Signed)
Anesthesia Evaluation  Patient identified by MRN, date of birth, ID band Patient awake    Reviewed: Allergy & Precautions, H&P , NPO status , Patient's Chart, lab work & pertinent test results  History of Anesthesia Complications Negative for: history of anesthetic complications  Airway Mallampati: III  TM Distance: >3 FB Neck ROM: full    Dental  (+) Chipped   Pulmonary neg pulmonary ROS, neg shortness of breath, former smoker,           Cardiovascular Exercise Tolerance: Good hypertension, (-) angina+ CAD, + Past MI, + Cardiac Stents, + CABG and + Peripheral Vascular Disease  (-) DOE      Neuro/Psych PSYCHIATRIC DISORDERS negative neurological ROS     GI/Hepatic Neg liver ROS, GERD  Medicated and Controlled,  Endo/Other  negative endocrine ROS  Renal/GU negative Renal ROS  negative genitourinary   Musculoskeletal   Abdominal   Peds  Hematology negative hematology ROS (+)   Anesthesia Other Findings Past Medical History: No date: Abnormal Pap smear of cervix     Comment:  Dr. Prentice Docker  No date: Coronary artery disease     Comment:  a. 06/2009 MI/PCI to LCX and LAD; b. 01/2011 Occluded               stents-->CABG x 3 ([facility]): LIMA->LAD, VG->LCX, VG->RPDA;               c. 01/2012 Cath: LM 50, LAD 95ost, 134m, LCX 95ost, 142m,              RCA 22m, RPDA small, LIMA->LAD ok, VG->LCX ok, VG->RPDA               100-->Med rx. No date: Degenerative disc disease, lumbar     Comment:  L4/5 noted CT ab/pelvis 05/19/11  No date: Hyperlipidemia No date: Hypertension     Comment:  under control No date: Macular degeneration No date: Parathyroid adenoma     Comment:  right 13 x 8 mm s/p resection  No date: PVD (peripheral vascular disease) ([place])     Comment:  a. 01/2010 s/p Aortofemoral bypass.  Past Surgical History: 2013: CARDIAC CATHETERIZATION     Comment:  [place] No date: CATARACT EXTRACTION No date:  COLPOSCOPY 02/07/2010: CORONARY ARTERY BYPASS GRAFT     Comment:  x 3 2013: FACIAL COSMETIC SURGERY     Comment:  Dr. Nadeen Landau No date: FOOT SURGERY     Comment:  right 2011: ILIO-FEMORAL BYPASS GRAFT 01/2010: MITRAL VALVE REPAIR 10/08/2016: ORIF ANKLE FRACTURE; Right     Comment:  Procedure: OPEN REDUCTION INTERNAL FIXATION (ORIF) ANKLE              FRACTURE;  Surgeon: Corky Mull, MD;  Location: [facility]               ORS;  Service: Orthopedics;  Laterality: Right; No date: PARATHYROIDECTOMY No date: TUBAL LIGATION     Reproductive/Obstetrics negative OB ROS                             Anesthesia Physical Anesthesia Plan  ASA: III  Anesthesia Plan: General   Post-op Pain Management: GA combined w/ Regional for post-op pain   Induction: Intravenous  PONV Risk Score and Plan: Propofol infusion and TIVA  Airway Management Planned: Natural Airway and Nasal Cannula  Additional Equipment:   Intra-op Plan:   Post-operative Plan:   Informed Consent: I have  reviewed the patients History and Physical, chart, labs and discussed the procedure including the risks, benefits and alternatives for the proposed anesthesia with the patient or authorized representative who has indicated his/her understanding and acceptance.     Dental Advisory Given  Plan Discussed with: Anesthesiologist, CRNA and Surgeon  Anesthesia Plan Comments: (Patient consented for risks of anesthesia including but not limited to:  - adverse reactions to medications - risk of intubation if required - damage to teeth, lips or other oral mucosa - sore throat or hoarseness - Damage to heart, brain, lungs or loss of life  Patient voiced understanding.)        Anesthesia Quick Evaluation

## 2018-12-29 NOTE — Discharge Instructions (Addendum)
AMBULATORY SURGERY  DISCHARGE INSTRUCTIONS   1) The drugs that you were given will stay in your system until tomorrow so for the next 24 hours you should not:  A) Drive an automobile B) Make any legal decisions C) Drink any alcoholic beverage   2) You may resume regular meals tomorrow.  Today it is better to start with liquids and gradually work up to solid foods.  You may eat anything you prefer, but it is better to start with liquids, then soup and crackers, and gradually work up to solid foods.   3) Please notify your doctor immediately if you have any unusual bleeding, trouble breathing, redness and pain at the surgery site, drainage, fever, or pain not relieved by medication. 4)   5) Your post-operative visit with Dr.                                     is: Date:                        Time:    Please call to schedule your post-operative visit.  6) Additional Instructions:    Orthopedic discharge instructions: Keep splint dry and intact. Keep hand elevated above heart level. Apply ice to affected area frequently. Take ibuprofen 600 mg TID with meals for 7-10 days, then as necessary. Take pain medication as prescribed or ES Tylenol when needed.  Return for follow-up in 10-14 days or as scheduled.

## 2018-12-29 NOTE — Progress Notes (Signed)
Delete column, actual time 09:30

## 2018-12-29 NOTE — Anesthesia Postprocedure Evaluation (Signed)
Anesthesia Post Note  Patient: [name]  Procedure(s) Performed: OPEN REDUCTION INTERNAL FIXATION (ORIF) WRIST FRACTURE (Right Wrist)  Patient location during evaluation: PACU Anesthesia Type: General Level of consciousness: awake and alert Pain management: pain level controlled Vital Signs Assessment: post-procedure vital signs reviewed and stable Respiratory status: spontaneous breathing, nonlabored ventilation, respiratory function stable and patient connected to nasal cannula oxygen Cardiovascular status: blood pressure returned to baseline and stable Postop Assessment: no apparent nausea or vomiting Anesthetic complications: no     Last Vitals:  Vitals:   12/29/18 1935 12/29/18 1950  BP: 121/60 127/67  Pulse: 69 68  Resp: 19   Temp:    SpO2: 97% 98%    Last Pain:  Vitals:   12/29/18 1950  TempSrc:   PainSc: 0-No pain                 Aiva Miskell S

## 2018-12-30 ENCOUNTER — Encounter: Payer: Self-pay | Admitting: Surgery

## 2018-12-30 DIAGNOSIS — S52531A Colles' fracture of right radius, initial encounter for closed fracture: Secondary | ICD-10-CM | POA: Insufficient documentation

## 2019-01-09 DIAGNOSIS — S52531D Colles' fracture of right radius, subsequent encounter for closed fracture with routine healing: Secondary | ICD-10-CM | POA: Diagnosis not present

## 2019-01-12 DIAGNOSIS — C44619 Basal cell carcinoma of skin of left upper limb, including shoulder: Secondary | ICD-10-CM | POA: Diagnosis not present

## 2019-01-12 DIAGNOSIS — M545 Low back pain: Secondary | ICD-10-CM | POA: Diagnosis not present

## 2019-01-26 ENCOUNTER — Encounter (INDEPENDENT_AMBULATORY_CARE_PROVIDER_SITE_OTHER): Payer: Medicare HMO | Admitting: Ophthalmology

## 2019-01-26 DIAGNOSIS — I10 Essential (primary) hypertension: Secondary | ICD-10-CM | POA: Diagnosis not present

## 2019-01-26 DIAGNOSIS — H43813 Vitreous degeneration, bilateral: Secondary | ICD-10-CM

## 2019-01-26 DIAGNOSIS — H353112 Nonexudative age-related macular degeneration, right eye, intermediate dry stage: Secondary | ICD-10-CM | POA: Diagnosis not present

## 2019-01-26 DIAGNOSIS — H26493 Other secondary cataract, bilateral: Secondary | ICD-10-CM | POA: Diagnosis not present

## 2019-01-26 DIAGNOSIS — H353221 Exudative age-related macular degeneration, left eye, with active choroidal neovascularization: Secondary | ICD-10-CM | POA: Diagnosis not present

## 2019-01-26 DIAGNOSIS — H35033 Hypertensive retinopathy, bilateral: Secondary | ICD-10-CM | POA: Diagnosis not present

## 2019-01-29 NOTE — Progress Notes (Signed)
Cardiology Office Note  Date:  01/31/2019   ID:  GIORGINA GRUENEWALD, DOB 03/29/1946, MRN [id number]  PCP:  Burnard Hawthorne, FNP   Chief Complaint  Patient presents with  . Other    12 month follow up. Patient denies chest pain and SOB at this time. Meds reviewed verbally with patient.     HPI:  Ms. Lorah is a pleasant 73 year old woman with a history of  coronary artery disease, stent to her left circumflex and 2 stents to the LAD in March of 2011, aorto femoral bypass grafting in early October 2011,  with occlusion of her coronary stents in mid-October requiring bypass surgery at [facility] in mid to late October 2012.  Cath 2013: occluded SVG to PDA thyroid surgery March 2014 long history of smoking though stopped in March of 2011 plastic surgery on her face in the past She presents for routine followup of her coronary artery disease.  In follow-up today she reports that she has retired Sedentary, no regular exercise program  On crestor and zetia daily, denies having significant side effects HBa1c 5.7 LDL 51  Weight down 5 pounds, watching her calories  Chronic back pain No chest pain or shortness of breath on exertion  Gallbladder attack April 2019 Declined surgery Prior attack 7 years earlier  EKG personally reviewed by myself on todays visit Shows normal sinus rhythm rate 89 bpm  nonspecific ST abnormality  Other past medical history reviewed Last stress test 2013 Cardiac catheterization October 2013  Right foot fracture 3 breaks, extensive rehab   History of Macular degeneration, getting eye shots Had cataract surg, now eyes are doing well  Chronic back pain Previously buying products from [employer] for weight loss   cardiac catheterization October 2013 showed LIMA to the LAD, vein graft to the left circumflex, vein graft to the PDA which is occluded that supplies a very small area. Left main has 50% disease, ostial LAD has 95% in-stent restenosis, mid LAD has  100% disease, 75% ostial circumflex, 100% mid circumflex, very small OM1, 30% RCA disease, ejection fraction 45%  Stress test shows suboptimal images due to GI activity on resting images, significant anterior wall ischemia in the mid to distal segments, reduced ejection fraction of 47%  Prior ventricular arrhythmia when her stents were placed in the Cath Lab requiring shock.  echocardiogram improved, EF greater than 40%.  Cardiac catheter report from July 15 2009 indicate she had a vision bare metal stent 2.75 x 28 mm in the LCX, mini vision bare metal stent 2.5 x 28 and 2.5 x 15 mm stent to the LAD.  DJD in her neck and back  PMH:   has a past medical history of Abnormal Pap smear of cervix, Coronary artery disease, Degenerative disc disease, lumbar, Hyperlipidemia, Hypertension, Macular degeneration, Parathyroid adenoma, and PVD (peripheral vascular disease) ([place]).  PSH:    Past Surgical History:  Procedure Laterality Date  . CARDIAC CATHETERIZATION  2013   [facility]  . CATARACT EXTRACTION    . COLPOSCOPY    . CORONARY ARTERY BYPASS GRAFT  02/07/2010   x 3  . FACIAL COSMETIC SURGERY  2013   Dr. Nadeen Landau  . FOOT SURGERY     right  . ILIO-FEMORAL BYPASS GRAFT  2011  . MITRAL VALVE REPAIR  01/2010  . ORIF ANKLE FRACTURE Right 10/08/2016   Procedure: OPEN REDUCTION INTERNAL FIXATION (ORIF) ANKLE FRACTURE;  Surgeon: Corky Mull, MD;  Location: [facility] ORS;  Service: Orthopedics;  Laterality: Right;  .  ORIF WRIST FRACTURE Right 12/29/2018   Procedure: OPEN REDUCTION INTERNAL FIXATION (ORIF) WRIST FRACTURE;  Surgeon: Corky Mull, MD;  Location: [facility] ORS;  Service: Orthopedics;  Laterality: Right;  . PARATHYROIDECTOMY    . TUBAL LIGATION      Current Outpatient Medications  Medication Sig Dispense Refill  . ARNICA EX Apply 1 application topically daily as needed (bruising).    Marland Kitchen aspirin 81 MG EC tablet Take 81 mg by mouth daily.     . Cholecalciferol (VITAMIN D3) 1000  UNITS CAPS Take 1,000 Units by mouth daily.     Marland Kitchen ezetimibe (ZETIA) 10 MG tablet TAKE 1 TABLET BY MOUTH ONCE DAILY. 90 tablet 3  . HYDROcodone-acetaminophen (NORCO) 7.5-325 MG tablet Take 1 tablet by mouth every 6 (six) hours as needed for moderate pain. 30 tablet 0  . metoprolol tartrate (LOPRESSOR) 25 MG tablet TAKE 1/2 TABLET BY MOUTH TWICE DAILY (Patient taking differently: Take 12.5 mg by mouth 2 (two) times daily. ) 90 tablet 3  . Multiple Vitamins-Minerals (PRESERVISION AREDS 2 PO) Take 1 capsule by mouth 2 (two) times daily.    . pantoprazole (PROTONIX) 40 MG tablet TAKE 1 TABLET BY MOUTH ONCE DAILY. (Patient taking differently: Take 40 mg by mouth daily. ) 90 tablet 4  . Probiotic Product (RISA-BID PROBIOTIC PO) Take 250 mg by mouth daily.     . rosuvastatin (CRESTOR) 40 MG tablet TAKE 1 TABLET BY MOUTH ONCE DAILY 90 tablet 0  . sertraline (ZOLOFT) 100 MG tablet Take 1 tablet (100 mg total) by mouth daily. 90 tablet 3   No current facility-administered medications for this visit.      Allergies:   Patient has no known allergies.   Social History:  The patient  reports that she quit smoking about 9 years ago. Her smoking use included cigarettes. She has a 40.00 pack-year smoking history. She has never used smokeless tobacco. She reports current alcohol use of about 1.0 standard drinks of alcohol per week. She reports that she does not use drugs.   Family History:   family history includes Heart disease in her father.    Review of Systems: Review of Systems  Constitutional: Negative.   HENT: Negative.   Respiratory: Negative.   Cardiovascular: Negative.   Gastrointestinal: Negative.   Musculoskeletal: Positive for back pain.  Neurological: Negative.   Psychiatric/Behavioral: Negative.   All other systems reviewed and are negative.   PHYSICAL EXAM: VS:  BP 122/70 (BP Location: Left Arm, Patient Position: Sitting, Cuff Size: Normal)   Pulse 89   Ht 5' (1.524 m)   Wt 140 lb  12 oz (63.8 kg)   BMI 27.49 kg/m  , BMI Body mass index is 27.49 kg/m. Constitutional:  oriented to person, place, and time. No distress.  HENT:  Head: Grossly normal Eyes:  no discharge. No scleral icterus.  Neck: No JVD, no carotid bruits  Cardiovascular: Regular rate and rhythm, no murmurs appreciated Pulmonary/Chest: Clear to auscultation bilaterally, no wheezes or rails Abdominal: Soft.  no distension.  no tenderness.  Musculoskeletal: Normal range of motion Neurological:  normal muscle tone. Coordination normal. No atrophy Skin: Skin warm and dry Psychiatric: normal affect, pleasant  Recent Labs: 04/15/2018: ALT 65; BUN 13; Creatinine, Ser 0.79; Hemoglobin 14.6; Platelets 231.0; Potassium 4.4; Sodium 135; TSH 1.90    Lipid Panel Lab Results  Component Value Date   CHOL 136 04/15/2018   HDL 70.90 04/15/2018   LDLCALC 51 04/15/2018   TRIG 71.0 04/15/2018  Wt Readings from Last 3 Encounters:  01/31/19 140 lb 12 oz (63.8 kg)  12/29/18 142 lb (64.4 kg)  12/18/18 142 lb (64.4 kg)      ASSESSMENT AND PLAN:  Mixed hyperlipidemia - Plan: EKG 12-Lead Cholesterol is at goal on the current lipid regimen. No changes to the medications were made. stay on Zetia to her Crestor   Coronary atherosclerosis of autologous vein bypass graft without angina -  Currently with no symptoms of angina. No further workup at this time. Continue current medication regimen.  PVD (peripheral vascular disease) ([place]) - Plan: EKG 12-Lead Stable, no sx of claudication Lipids at goal  Essential hypertension, benign -  Blood pressure is well controlled on today's visit. No changes made to the medications.  Class 1 obesity due to excess calories without serious comorbidity with body mass index (BMI) of 33.0 to 33.9 in adult Weight down 5 pouns We have encouraged continued exercise, careful diet management in an effort to lose weight.   Total encounter time more than 25 minutes  Greater  than 50% was spent in counseling and coordination of care with the patient   Disposition:   F/U  12 months   No orders of the defined types were placed in this encounter.   Signed, Esmond Plants, M.D., Ph.D. 01/31/2019  [place], [place]

## 2019-01-31 ENCOUNTER — Ambulatory Visit (INDEPENDENT_AMBULATORY_CARE_PROVIDER_SITE_OTHER): Payer: Medicare HMO | Admitting: Cardiovascular Disease

## 2019-01-31 ENCOUNTER — Encounter: Payer: Self-pay | Admitting: Cardiovascular Disease

## 2019-01-31 ENCOUNTER — Other Ambulatory Visit: Payer: Self-pay

## 2019-01-31 VITALS — BP 122/70 | HR 89 | Ht 60.0 in | Wt 140.8 lb

## 2019-01-31 DIAGNOSIS — E782 Mixed hyperlipidemia: Secondary | ICD-10-CM

## 2019-01-31 DIAGNOSIS — Z87891 Personal history of nicotine dependence: Secondary | ICD-10-CM | POA: Diagnosis not present

## 2019-01-31 DIAGNOSIS — I25118 Atherosclerotic heart disease of native coronary artery with other forms of angina pectoris: Secondary | ICD-10-CM

## 2019-01-31 DIAGNOSIS — Z23 Encounter for immunization: Secondary | ICD-10-CM

## 2019-01-31 DIAGNOSIS — I10 Essential (primary) hypertension: Secondary | ICD-10-CM

## 2019-01-31 DIAGNOSIS — I739 Peripheral vascular disease, unspecified: Secondary | ICD-10-CM | POA: Diagnosis not present

## 2019-01-31 NOTE — Patient Instructions (Addendum)
Flu shot   Medication Instructions:  No changes  If you need a refill on your cardiac medications before your next appointment, please call your pharmacy.    Lab work: No new labs needed   If you have labs (blood work) drawn today and your tests are completely normal, you will receive your results only by: Marland Kitchen MyChart Message (if you have MyChart) OR . A paper copy in the mail If you have any lab test that is abnormal or we need to change your treatment, we will call you to review the results.   Testing/Procedures: No new testing needed   Follow-Up: At [facility], you and your health needs are our priority.  As part of our continuing mission to provide you with exceptional heart care, we have created designated Provider Care Teams.  These Care Teams include your primary Cardiologist (physician) and Advanced Practice Providers (APPs -  Physician Assistants and Nurse Practitioners) who all work together to provide you with the care you need, when you need it.  . You will need a follow up appointment in 6 months (patient preference) .   Please call our office 2 months in advance to schedule this appointment.    . Providers on your designated Care Team:   . Murray Hodgkins, NP . Christell Faith, PA-C . Marrianne Mood, PA-C  Any Other Special Instructions Will Be Listed Below (If Applicable).  For educational health videos Log in to : [email or web address] Or : [email or web address], password : [email or web address]

## 2019-02-13 DIAGNOSIS — S52531D Colles' fracture of right radius, subsequent encounter for closed fracture with routine healing: Secondary | ICD-10-CM | POA: Diagnosis not present

## 2019-02-27 ENCOUNTER — Other Ambulatory Visit: Payer: Self-pay | Admitting: Cardiovascular Disease

## 2019-02-27 DIAGNOSIS — H60332 Swimmer's ear, left ear: Secondary | ICD-10-CM | POA: Diagnosis not present

## 2019-02-27 DIAGNOSIS — L508 Other urticaria: Secondary | ICD-10-CM | POA: Diagnosis not present

## 2019-02-27 DIAGNOSIS — H903 Sensorineural hearing loss, bilateral: Secondary | ICD-10-CM | POA: Diagnosis not present

## 2019-03-01 ENCOUNTER — Other Ambulatory Visit: Payer: Self-pay

## 2019-03-01 ENCOUNTER — Encounter (INDEPENDENT_AMBULATORY_CARE_PROVIDER_SITE_OTHER): Payer: Medicare HMO | Admitting: Ophthalmology

## 2019-03-01 ENCOUNTER — Ambulatory Visit (INDEPENDENT_AMBULATORY_CARE_PROVIDER_SITE_OTHER): Payer: Medicare HMO

## 2019-03-01 DIAGNOSIS — Z0000 Encounter for general adult medical examination without abnormal findings: Secondary | ICD-10-CM

## 2019-03-01 DIAGNOSIS — H26491 Other secondary cataract, right eye: Secondary | ICD-10-CM

## 2019-03-01 NOTE — Patient Instructions (Addendum)
  [name] , Thank you for taking time to come for your Medicare Wellness Visit. I appreciate your ongoing commitment to your health goals. Please review the following plan we discussed and let me know if I can assist you in the future.   These are the goals we discussed: Goals      Patient Stated   . "I want to maintain" (pt-stated)     Stay active Stay hydrated Healthy diet       This is a list of the screening recommended for you and due dates:  Health Maintenance  Topic Date Due  . Mammogram  04/04/2020  . Tetanus Vaccine  06/25/2020  . Colon Cancer Screening  11/05/2022  . Flu Shot  Completed  . DEXA scan (bone density measurement)  Completed  .  Hepatitis C: One time screening is recommended by Center for Disease Control  (CDC) for  adults born from [birth year] through 1963.   Completed  . Pneumonia vaccines  Completed

## 2019-03-01 NOTE — Progress Notes (Addendum)
Subjective:   [name] is a 73 y.o. female who presents for Medicare Annual (Subsequent) preventive examination.  Review of Systems:  No ROS.  Medicare Wellness Virtual Visit.  Visual/audio telehealth visit, UTA vital signs.   See social history for additional risk factors.   Cardiac Risk Factors include: advanced age (>81men, >71 women);hypertension     Objective:     Vitals: There were no vitals taken for this visit.  There is no height or weight on file to calculate BMI.  Advanced Directives 03/01/2019 12/18/2018 10/30/2016 10/28/2016 10/20/2016 10/08/2016  Does Patient Have a Medical Advance Directive? Yes No No No No Yes  Type of Advance Directive [place]  Does patient want to make changes to medical advance directive? No - Patient declined - - - - No - Patient declined  Copy of [place] in Chart? No - copy requested - - - - No - copy requested  Would patient like information on creating a medical advance directive? - - - - - No - Patient declined    Tobacco Social History   Tobacco Use  Smoking Status Former Smoker  . Packs/day: 1.00  . Years: 40.00  . Pack years: 40.00  . Types: Cigarettes  . Quit date: 07/15/2009  . Years since quitting: 9.6  Smokeless Tobacco Never Used  Tobacco Comment   quit 06/2009 smoked from 34s to 2011 1.5 ppd no FH lung cancer      Counseling given: Not Answered Comment: quit 06/2009 smoked from 11s to 2011 1.5 ppd no FH lung cancer    Clinical Intake:  Pre-visit preparation completed: Yes        Diabetes: No  How often do you need to have someone help you when you read instructions, pamphlets, or other written materials from your doctor or pharmacy?: 1 - Never  Interpreter Needed?: No     Past Medical History:  Diagnosis Date  . Abnormal Pap smear of cervix    Dr. Prentice Docker   . Coronary artery disease    a. 06/2009 MI/PCI to LCX and LAD;  b. 01/2011 Occluded stents-->CABG x 3 ([facility]): LIMA->LAD, VG->LCX, VG->RPDA; c. 01/2012 Cath: LM 50, LAD 95ost, 136m, LCX 95ost, 160m, RCA 28m, RPDA small, LIMA->LAD ok, VG->LCX ok, VG->RPDA 100-->Med rx.  . Degenerative disc disease, lumbar    L4/5 noted CT ab/pelvis 05/19/11   . Hyperlipidemia   . Hypertension    under control  . Macular degeneration   . Parathyroid adenoma    right 13 x 8 mm s/p resection   . PVD (peripheral vascular disease) ([place])    a. 01/2010 s/p Aortofemoral bypass.   Past Surgical History:  Procedure Laterality Date  . CARDIAC CATHETERIZATION  2013   [facility]  . CATARACT EXTRACTION    . COLPOSCOPY    . CORONARY ARTERY BYPASS GRAFT  02/07/2010   x 3  . FACIAL COSMETIC SURGERY  2013   Dr. Nadeen Landau  . FOOT SURGERY     right  . ILIO-FEMORAL BYPASS GRAFT  2011  . MITRAL VALVE REPAIR  01/2010  . ORIF ANKLE FRACTURE Right 10/08/2016   Procedure: OPEN REDUCTION INTERNAL FIXATION (ORIF) ANKLE FRACTURE;  Surgeon: Corky Mull, MD;  Location: [facility] ORS;  Service: Orthopedics;  Laterality: Right;  . ORIF WRIST FRACTURE Right 12/29/2018   Procedure: OPEN REDUCTION INTERNAL FIXATION (ORIF) WRIST FRACTURE;  Surgeon: Corky Mull, MD;  Location: [facility] ORS;  Service: Orthopedics;  Laterality: Right;  . PARATHYROIDECTOMY    . TUBAL LIGATION     Family History  Problem Relation Age of Onset  . Heart disease Father    Social History   Socioeconomic History  . Marital status: Single    Spouse name: Not on file  . Number of children: Not on file  . Years of education: Not on file  . Highest education level: Not on file  Occupational History  . Not on file  Social Needs  . Financial resource strain: Not hard at all  . Food insecurity    Worry: Never true    Inability: Never true  . Transportation needs    Medical: No    Non-medical: No  Tobacco Use  . Smoking status: Former Smoker    Packs/day: 1.00    Years: 40.00    Pack years: 40.00    Types: Cigarettes     Quit date: 07/15/2009    Years since quitting: 9.6  . Smokeless tobacco: Never Used  . Tobacco comment: quit 06/2009 smoked from 20s to 2011 1.5 ppd no FH lung cancer   Substance and Sexual Activity  . Alcohol use: Yes    Alcohol/week: 1.0 standard drinks    Types: 1 Standard drinks or equivalent per week    Comment: occ.  . Drug use: No  . Sexual activity: Not Currently    Birth control/protection: Post-menopausal  Lifestyle  . Physical activity    Days per week: 5 days    Minutes per session: Not on file  . Stress: Not at all  Relationships  . Social Herbalist on phone: Not on file    Gets together: Not on file    Attends religious service: Not on file    Active member of club or organization: Not on file    Attends meetings of clubs or organizations: Not on file    Relationship status: Not on file  Other Topics Concern  . 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    Exercise- walks daily , wears fit bit.     Outpatient Encounter Medications as of 03/01/2019  Medication Sig  . ARNICA EX Apply 1 application topically daily as needed (bruising).  Marland Kitchen aspirin 81 MG EC tablet Take 81 mg by mouth daily.   . Cholecalciferol (VITAMIN D3) 1000 UNITS CAPS Take 1,000 Units by mouth daily.   Marland Kitchen ezetimibe (ZETIA) 10 MG tablet TAKE 1 TABLET BY MOUTH ONCE DAILY.  Marland Kitchen HYDROcodone-acetaminophen (NORCO) 7.5-325 MG tablet Take 1 tablet by mouth every 6 (six) hours as needed for moderate pain.  . metoprolol tartrate (LOPRESSOR) 25 MG tablet TAKE 1/2 TABLET BY MOUTH TWICE DAILY (Patient taking differently: Take 12.5 mg by mouth 2 (two) times daily. )  . mometasone (ELOCON) 0.1 % cream   . Multiple Vitamins-Minerals (PRESERVISION AREDS 2 PO) Take 1 capsule by mouth 2 (two) times daily.  . pantoprazole (PROTONIX) 40 MG tablet TAKE 1 TABLET BY MOUTH ONCE DAILY. (Patient taking differently:  Take 40 mg by mouth daily. )  . Probiotic Product (RISA-BID PROBIOTIC PO) Take 250 mg by mouth daily.   . rosuvastatin (CRESTOR) 40 MG tablet TAKE 1 TABLET BY MOUTH AT BEDTIME  . sertraline (ZOLOFT) 100 MG tablet Take 1 tablet (100 mg total) by mouth daily.   No  facility-administered encounter medications on file as of 03/01/2019.     Activities of Daily Living In your present state of health, do you have any difficulty performing the following activities: 03/01/2019  Hearing? N  Vision? N  Difficulty concentrating or making decisions? N  Walking or climbing stairs? N  Dressing or bathing? N  Doing errands, shopping? N  Preparing Food and eating ? N  Using the Toilet? N  In the past six months, have you accidently leaked urine? N  Do you have problems with loss of bowel control? N  Managing your Medications? N  Managing your Finances? N  Housekeeping or managing your Housekeeping? N  Some recent data might be hidden    Patient Care Team: Burnard Hawthorne, FNP as PCP - General (Family Medicine) Minna Merritts, MD as PCP - Cardiology (Cardiology)    Assessment:   This is a routine wellness examination for [name].  Nurse connected with patient 03/01/19 at 12:30 PM EST by a telephone enabled telemedicine application and verified that I am speaking with the correct person using two identifiers. Patient stated full name and DOB. Patient gave permission to continue with virtual visit. Patient's location was at home and Nurse's location was at [place] office.   Health Maintenance Due: See completed HM at the end of note.   Eye: Visual acuity not assessed. Virtual visit. Wears corrective lenses. Followed by their ophthalmologist. Macular.  Dental: Visits every 6 months.    Hearing: Demonstrates normal hearing during visit.  Safety:  Patient feels safe at home- yes Patient does have smoke detectors at home- yes Patient does wear sunscreen or protective clothing when in  direct sunlight - yes Patient does wear seat belt when in a moving vehicle - yes Patient drives- yes Adequate lighting in walkways free from debris- yes Grab bars and handrails used as appropriate- yes Ambulates with no assistive device Cell phone on person when ambulating outside of the home- yes  Social: Alcohol intake - yes      Smoking history- former    Smokers in home? none Illicit drug use? none  Depression: PHQ 2 &9 complete. See screening below. Denies irritability, anhedonia, sadness/tearfullness.  Stable.   Falls: See screening below.    Medication: Taking as directed and without issues.   Covid-19: Precautions and sickness symptoms discussed. Wears mask, social distancing, hand hygiene as appropriate.   Activities of Daily Living Patient denies needing assistance with: household chores, feeding themselves, getting from bed to chair, getting to the toilet, bathing/showering, dressing, managing money, or preparing meals.   Memory: Patient is alert. Patient denies difficulty focusing or concentrating. Correctly identified the president of the [place], season and recall. Patient likes to play games on her ipad and phone for brain stimulation.   BMI- discussed the importance of a healthy diet, water intake and the benefits of aerobic exercise.  Educational material provided.  Physical activity- walking daily about 1/2-1 mile  Diet:  Regular Water: good intake Caffeine: 1-2 cups of coffee  Other Providers Patient Care Team: Burnard Hawthorne, FNP as PCP - General (Family Medicine) Rockey Situ Kathlene November, MD as PCP - Cardiology (Cardiology)  Exercise Activities and Dietary recommendations Current Exercise Habits: Home exercise routine, Type of exercise: walking, Intensity: Mild  Goals      Patient Stated   . "I want to maintain" (pt-stated)     Stay active Stay hydrated Healthy diet       Fall Risk Fall Risk  03/01/2019 12/07/2018  06/29/2017 03/26/2016  01/21/2016  Falls in the past year? 1 0 No No No  Number falls in past yr: 0 0 - - -  Injury with Fall? 1 0 - - -  Comment Tripped over her dog several months ago. Followed by ortho and pcp. - - - -  Follow up Falls prevention discussed;Education provided Falls evaluation completed - - -   Timed Get Up and Go performed: no, virtual visit  Depression Screen PHQ 2/9 Scores 03/01/2019 12/07/2018 04/15/2018 06/29/2017  PHQ - 2 Score 0 0 0 0  PHQ- 9 Score - - 2 -     Cognitive Function     6CIT Screen 03/01/2019  What Year? 0 points  What month? 0 points  What time? 0 points  Count back from 20 0 points  Months in reverse 0 points  Repeat phrase 0 points  Total Score 0    Immunization History  Administered Date(s) Administered  . 19-influenza Whole 01/28/2017  . Fluad Quad(high Dose 65+) 01/31/2019  . Influenza Split 01/19/2012  . Influenza-Unspecified 03/21/2010, 01/14/2011, 01/29/2012, 01/18/2013, 01/22/2014, 01/16/2015, 01/29/2016  . Pneumococcal Conjugate-13 01/04/2014  . Pneumococcal Polysaccharide-23 10/03/2008  . Td 06/26/2010  . Tdap 06/26/2010  . Zoster 11/19/2007   Screening Tests Health Maintenance  Topic Date Due  . MAMMOGRAM  04/04/2020  . TETANUS/TDAP  06/25/2020  . COLONOSCOPY  11/05/2022  . INFLUENZA VACCINE  Completed  . DEXA SCAN  Completed  . Hepatitis C Screening  Completed  . PNA vac Low Risk Adult  Completed      Plan:   Keep all routine maintenance appointments.   Follow up scheduled with Philis Nettle FNP 03/03/19 @ 1:20 for rash on neck.  Medicare Attestation I have personally reviewed: The patient's medical and social history Their use of alcohol, tobacco or illicit drugs Their current medications and supplements The patient's functional ability including ADLs,fall risks, home safety risks, cognitive, and hearing and visual impairment Diet and physical activities Evidence for depression   In addition, I have reviewed and discussed with  patient certain preventive protocols, quality metrics, and best practice recommendations. A written personalized care plan for preventive services as well as general preventive health recommendations were provided to patient via mail.     Varney Biles, LPN  [phone]    Dr. Gayland Curry for [facility]

## 2019-03-02 ENCOUNTER — Encounter: Payer: Self-pay | Admitting: Family Medicine

## 2019-03-02 ENCOUNTER — Ambulatory Visit (INDEPENDENT_AMBULATORY_CARE_PROVIDER_SITE_OTHER): Payer: Medicare HMO | Admitting: Family Medicine

## 2019-03-02 ENCOUNTER — Other Ambulatory Visit: Payer: Self-pay

## 2019-03-02 VITALS — Ht 60.0 in | Wt 140.0 lb

## 2019-03-02 DIAGNOSIS — B029 Zoster without complications: Secondary | ICD-10-CM | POA: Diagnosis not present

## 2019-03-02 MED ORDER — VALACYCLOVIR HCL 1 G PO TABS: 1000.0000 mg | ORAL_TABLET | Freq: Three times a day (TID) | ORAL | 0 refills | Status: DC

## 2019-03-02 NOTE — Progress Notes (Signed)
Patient ID: [name], female   DOB: 23-Feb-1946, 73 y.o.   MRN: [id number]    Virtual Visit via Video Note  This visit type was conducted due to national recommendations for restrictions regarding the COVID-19 pandemic (e.g. social distancing).  This format is felt to be most appropriate for this patient at this time.  All issues noted in this document were discussed and addressed.  No physical exam was performed (except for noted visual exam findings with Video Visits).   I connected with [name] today at  1:20 PM EST by a video enabled telemedicine application or telephone and verified that I am speaking with the correct person using two identifiers. Location patient: home Location provider: work or home office Persons participating in the virtual visit: patient, provider  I discussed the limitations, risks, security and privacy concerns of performing an evaluation and management service by video and the availability of in person appointments. I also discussed with the patient that there may be a patient responsible charge related to this service. The patient expressed understanding and agreed to proceed.   HPI:  Patient and I connected via video due to rash on front of neck and up on side of left cheek and face.  Patient states the rash has been present for about 5 or 6 days and is very itchy.  Denies it being painful.  Does have a few scabbed areas on left cheek from her scratching self to the point of bleeding.  Patient did put topical triamcinolone cream on the area, had this cream leftover from a previous rash.  States the cream is calm the rash down somewhat but it continues to be present and very itchy.  ROS: See pertinent positives and negatives per HPI.  Past Medical History:  Diagnosis Date  . Abnormal Pap smear of cervix    Dr. Prentice Docker   . Coronary artery disease    a. 06/2009 MI/PCI to LCX and LAD; b. 01/2011 Occluded stents-->CABG x 3 ([facility]): LIMA->LAD, VG->LCX,  VG->RPDA; c. 01/2012 Cath: LM 50, LAD 95ost, 113m, LCX 95ost, 146m, RCA 67m, RPDA small, LIMA->LAD ok, VG->LCX ok, VG->RPDA 100-->Med rx.  . Degenerative disc disease, lumbar    L4/5 noted CT ab/pelvis 05/19/11   . Hyperlipidemia   . Hypertension    under control  . Macular degeneration   . Parathyroid adenoma    right 13 x 8 mm s/p resection   . PVD (peripheral vascular disease) ([place])    a. 01/2010 s/p Aortofemoral bypass.    Past Surgical History:  Procedure Laterality Date  . CARDIAC CATHETERIZATION  2013   [facility]  . CATARACT EXTRACTION    . COLPOSCOPY    . CORONARY ARTERY BYPASS GRAFT  02/07/2010   x 3  . FACIAL COSMETIC SURGERY  2013   Dr. Nadeen Landau  . FOOT SURGERY     right  . ILIO-FEMORAL BYPASS GRAFT  2011  . MITRAL VALVE REPAIR  01/2010  . ORIF ANKLE FRACTURE Right 10/08/2016   Procedure: OPEN REDUCTION INTERNAL FIXATION (ORIF) ANKLE FRACTURE;  Surgeon: Corky Mull, MD;  Location: [facility] ORS;  Service: Orthopedics;  Laterality: Right;  . ORIF WRIST FRACTURE Right 12/29/2018   Procedure: OPEN REDUCTION INTERNAL FIXATION (ORIF) WRIST FRACTURE;  Surgeon: Corky Mull, MD;  Location: [facility] ORS;  Service: Orthopedics;  Laterality: Right;  . PARATHYROIDECTOMY    . TUBAL LIGATION      Family History  Problem Relation Age of Onset  . Heart  disease Father      Current Outpatient Medications:  .  aspirin 81 MG EC tablet, Take 81 mg by mouth daily. , Disp: , Rfl:  .  Cholecalciferol (VITAMIN D3) 1000 UNITS CAPS, Take 1,000 Units by mouth daily. , Disp: , Rfl:  .  ezetimibe (ZETIA) 10 MG tablet, TAKE 1 TABLET BY MOUTH ONCE DAILY., Disp: 90 tablet, Rfl: 3 .  HYDROcodone-acetaminophen (NORCO) 7.5-325 MG tablet, Take 1 tablet by mouth every 6 (six) hours as needed for moderate pain., Disp: 30 tablet, Rfl: 0 .  metoprolol tartrate (LOPRESSOR) 25 MG tablet, TAKE 1/2 TABLET BY MOUTH TWICE DAILY (Patient taking differently: Take 12.5 mg by mouth 2 (two) times daily. ), Disp: 90  tablet, Rfl: 3 .  mometasone (ELOCON) 0.1 % cream, , Disp: , Rfl:  .  Multiple Vitamins-Minerals (PRESERVISION AREDS 2 PO), Take 1 capsule by mouth 2 (two) times daily., Disp: , Rfl:  .  pantoprazole (PROTONIX) 40 MG tablet, TAKE 1 TABLET BY MOUTH ONCE DAILY. (Patient taking differently: Take 40 mg by mouth daily. ), Disp: 90 tablet, Rfl: 4 .  Probiotic Product (RISA-BID PROBIOTIC PO), Take 250 mg by mouth daily. , Disp: , Rfl:  .  rosuvastatin (CRESTOR) 40 MG tablet, TAKE 1 TABLET BY MOUTH AT BEDTIME, Disp: 90 tablet, Rfl: 0 .  sertraline (ZOLOFT) 100 MG tablet, Take 1 tablet (100 mg total) by mouth daily., Disp: 90 tablet, Rfl: 3  EXAM:  GENERAL: alert, oriented, appears well and in no acute distress  HEENT: atraumatic, conjunttiva clear, no obvious abnormalities on inspection of external nose and ears  NECK: normal movements of the head and neck  LUNGS: on inspection no signs of respiratory distress, breathing rate appears normal, no obvious gross SOB, gasping or wheezing  CV: no obvious cyanosis  MS: moves all visible extremities without noticeable abnormality  SKIN: Red raised rash patch on neck going cheek.  Rash is not any further up on face.  No eye involvement.  Scattered small scabbed areas consistent with patient scratching self to point of bleeding previously.  No obvious pustules or vesicles at this time.  PSYCH/NEURO: pleasant and cooperative, no obvious depression or anxiety, speech and thought processing grossly intact  ASSESSMENT AND PLAN:  Discussed the following assessment and plan:  Shingles-suspect rash is shingles.  She will take Valtrex 3 times daily for 7 days.  Offered oral steroid taper to calm down inflammation, she declines steroid pills.  She will continue triamcinolone cream as needed for topical itching treatment.  Also suggested she could take something like a Benadryl at nighttime to help with itching sensation, offered hydroxyzine prescription for  itching but she declines.  Advised patient that if rash spreads any higher up face to call office right away because we want to be sure no eye involvement occurs.   I discussed the assessment and treatment plan with the patient. The patient was provided an opportunity to ask questions and all were answered. The patient agreed with the plan and demonstrated an understanding of the instructions.   The patient was advised to call back or seek an in-person evaluation if the symptoms worsen or if the condition fails to improve as anticipated.   Jodelle Green, FNP

## 2019-03-03 NOTE — Telephone Encounter (Signed)
I am not sure what she is referring to  We did not discuss anything about balance issues yesterday  I can try and look up something for her  LG

## 2019-03-09 ENCOUNTER — Encounter (INDEPENDENT_AMBULATORY_CARE_PROVIDER_SITE_OTHER): Payer: Medicare HMO | Admitting: Ophthalmology

## 2019-03-09 DIAGNOSIS — H353112 Nonexudative age-related macular degeneration, right eye, intermediate dry stage: Secondary | ICD-10-CM

## 2019-03-09 DIAGNOSIS — H35033 Hypertensive retinopathy, bilateral: Secondary | ICD-10-CM

## 2019-03-09 DIAGNOSIS — I10 Essential (primary) hypertension: Secondary | ICD-10-CM | POA: Diagnosis not present

## 2019-03-09 DIAGNOSIS — H43813 Vitreous degeneration, bilateral: Secondary | ICD-10-CM

## 2019-03-09 DIAGNOSIS — H353221 Exudative age-related macular degeneration, left eye, with active choroidal neovascularization: Secondary | ICD-10-CM

## 2019-03-09 DIAGNOSIS — H35371 Puckering of macula, right eye: Secondary | ICD-10-CM | POA: Diagnosis not present

## 2019-03-22 ENCOUNTER — Encounter: Payer: Self-pay | Admitting: Family

## 2019-04-07 ENCOUNTER — Encounter: Payer: Self-pay | Admitting: Family

## 2019-04-07 DIAGNOSIS — Z1231 Encounter for screening mammogram for malignant neoplasm of breast: Secondary | ICD-10-CM | POA: Diagnosis not present

## 2019-04-07 DIAGNOSIS — Z9289 Personal history of other medical treatment: Secondary | ICD-10-CM | POA: Diagnosis not present

## 2019-04-12 ENCOUNTER — Encounter: Payer: Self-pay | Admitting: Family

## 2019-04-22 ENCOUNTER — Encounter: Payer: Self-pay | Admitting: Family

## 2019-05-03 ENCOUNTER — Ambulatory Visit: Payer: Medicare HMO | Admitting: Family

## 2019-05-05 ENCOUNTER — Encounter (INDEPENDENT_AMBULATORY_CARE_PROVIDER_SITE_OTHER): Payer: Medicare HMO | Admitting: Ophthalmology

## 2019-05-05 ENCOUNTER — Other Ambulatory Visit: Payer: Self-pay

## 2019-05-05 DIAGNOSIS — H35033 Hypertensive retinopathy, bilateral: Secondary | ICD-10-CM | POA: Diagnosis not present

## 2019-05-05 DIAGNOSIS — H43813 Vitreous degeneration, bilateral: Secondary | ICD-10-CM | POA: Diagnosis not present

## 2019-05-05 DIAGNOSIS — I10 Essential (primary) hypertension: Secondary | ICD-10-CM | POA: Diagnosis not present

## 2019-05-05 DIAGNOSIS — H26492 Other secondary cataract, left eye: Secondary | ICD-10-CM

## 2019-05-05 DIAGNOSIS — H353112 Nonexudative age-related macular degeneration, right eye, intermediate dry stage: Secondary | ICD-10-CM

## 2019-05-05 DIAGNOSIS — H353221 Exudative age-related macular degeneration, left eye, with active choroidal neovascularization: Secondary | ICD-10-CM | POA: Diagnosis not present

## 2019-05-17 ENCOUNTER — Encounter: Payer: Self-pay | Admitting: Family

## 2019-05-17 ENCOUNTER — Ambulatory Visit (INDEPENDENT_AMBULATORY_CARE_PROVIDER_SITE_OTHER): Payer: Medicare HMO | Admitting: Family

## 2019-05-17 ENCOUNTER — Other Ambulatory Visit: Payer: Self-pay

## 2019-05-17 VITALS — BP 107/70 | Ht 60.0 in | Wt 140.0 lb

## 2019-05-17 DIAGNOSIS — F411 Generalized anxiety disorder: Secondary | ICD-10-CM | POA: Diagnosis not present

## 2019-05-17 DIAGNOSIS — R531 Weakness: Secondary | ICD-10-CM | POA: Insufficient documentation

## 2019-05-17 DIAGNOSIS — R5383 Other fatigue: Secondary | ICD-10-CM | POA: Diagnosis not present

## 2019-05-17 DIAGNOSIS — E782 Mixed hyperlipidemia: Secondary | ICD-10-CM | POA: Diagnosis not present

## 2019-05-17 DIAGNOSIS — I10 Essential (primary) hypertension: Secondary | ICD-10-CM

## 2019-05-17 NOTE — Progress Notes (Signed)
Virtual Visit via Video Note  I connected with@  on 05/17/19 at  4:00 PM EST by a video enabled telemedicine application and verified that I am speaking with the correct person using two identifiers.  Location patient: home Location provider:work Persons participating in the virtual visit: patient, provider  I discussed the limitations of evaluation and management by telemedicine and the availability of in person appointments. The patient expressed understanding and agreed to proceed.   HPI: Follow up today  HTN- compliant with medication, 107/86.  Denies exertional chest pain or pressure, numbness or tingling radiating to left arm or jaw, palpitations, dizziness, frequent headaches, changes in vision, or shortness of breath.   She notes that she had a period of fatigue, resolved once going back to work 2 months ago part time. 'thinks a combination of being stuck in the house' during [place]. Was sleeping more often however when went back to work, the routine was goof got her.   Has gone back to work at [employer] 2.5 days per week. Not stressful. Walks 2 miles per day at work.   Seen by Dr.Gollan 01/31/2019- PVD, CAD, HTN  Anxiety- compliant with zoloft. Feels well on this regimen  Due dexa- declines at this time.  ROS: See pertinent positives and negatives per HPI.  Past Medical History:  Diagnosis Date  . Abnormal Pap smear of cervix    Dr. Prentice Docker   . Coronary artery disease    a. 06/2009 MI/PCI to LCX and LAD; b. 01/2011 Occluded stents-->CABG x 3 ([facility]): LIMA->LAD, VG->LCX, VG->RPDA; c. 01/2012 Cath: LM 50, LAD 95ost, 137m, LCX 95ost, 110m, RCA 37m, RPDA small, LIMA->LAD ok, VG->LCX ok, VG->RPDA 100-->Med rx.  . Degenerative disc disease, lumbar    L4/5 noted CT ab/pelvis 05/19/11   . Hyperlipidemia   . Hypertension    under control  . Macular degeneration   . Parathyroid adenoma    right 13 x 8 mm s/p resection   . PVD (peripheral vascular disease) ([place])    a. 01/2010 s/p Aortofemoral bypass.    Past Surgical History:  Procedure Laterality Date  . CARDIAC CATHETERIZATION  2013   [facility]  . CATARACT EXTRACTION    . COLPOSCOPY    . CORONARY ARTERY BYPASS GRAFT  02/07/2010   x 3  . FACIAL COSMETIC SURGERY  2013   Dr. Nadeen Landau  . FOOT SURGERY     right  . ILIO-FEMORAL BYPASS GRAFT  2011  . MITRAL VALVE REPAIR  01/2010  . ORIF ANKLE FRACTURE Right 10/08/2016   Procedure: OPEN REDUCTION INTERNAL FIXATION (ORIF) ANKLE FRACTURE;  Surgeon: Corky Mull, MD;  Location: [facility] ORS;  Service: Orthopedics;  Laterality: Right;  . ORIF WRIST FRACTURE Right 12/29/2018   Procedure: OPEN REDUCTION INTERNAL FIXATION (ORIF) WRIST FRACTURE;  Surgeon: Corky Mull, MD;  Location: [facility] ORS;  Service: Orthopedics;  Laterality: Right;  . PARATHYROIDECTOMY    . TUBAL LIGATION      Family History  Problem Relation Age of Onset  . Heart disease Father     SOCIAL [id number] smoker   Current Outpatient Medications:  .  aspirin 81 MG EC tablet, Take 81 mg by mouth daily. , Disp: , Rfl:  .  Cholecalciferol (VITAMIN D3) 1000 UNITS CAPS, Take 1,000 Units by mouth daily. , Disp: , Rfl:  .  ezetimibe (ZETIA) 10 MG tablet, TAKE 1 TABLET BY MOUTH ONCE DAILY., Disp: 90 tablet, Rfl: 3 .  HYDROcodone-acetaminophen (NORCO) 7.5-325 MG tablet, Take 1  tablet by mouth every 6 (six) hours as needed for moderate pain., Disp: 30 tablet, Rfl: 0 .  metoprolol tartrate (LOPRESSOR) 25 MG tablet, TAKE 1/2 TABLET BY MOUTH TWICE DAILY (Patient taking differently: Take 12.5 mg by mouth 2 (two) times daily. ), Disp: 90 tablet, Rfl: 3 .  Multiple Vitamins-Minerals (PRESERVISION AREDS 2 PO), Take 1 capsule by mouth 2 (two) times daily., Disp: , Rfl:  .  pantoprazole (PROTONIX) 40 MG tablet, TAKE 1 TABLET BY MOUTH ONCE DAILY. (Patient taking differently: Take 40 mg by mouth daily. ), Disp: 90 tablet, Rfl: 4 .  Probiotic Product (RISA-BID PROBIOTIC PO), Take 250 mg by mouth daily. , Disp: ,  Rfl:  .  rosuvastatin (CRESTOR) 40 MG tablet, TAKE 1 TABLET BY MOUTH AT BEDTIME, Disp: 90 tablet, Rfl: 0 .  sertraline (ZOLOFT) 100 MG tablet, Take 1 tablet (100 mg total) by mouth daily., Disp: 90 tablet, Rfl: 3  EXAM:  VITALS per patient if applicable:  GENERAL: alert, oriented, appears well and in no acute distress  HEENT: atraumatic, conjunttiva clear, no obvious abnormalities on inspection of external nose and ears  NECK: normal movements of the head and neck  LUNGS: on inspection no signs of respiratory distress, breathing rate appears normal, no obvious gross SOB, gasping or wheezing  CV: no obvious cyanosis  MS: moves all visible extremities without noticeable abnormality  PSYCH/NEURO: pleasant and cooperative, no obvious depression or anxiety, speech and thought processing grossly intact  ASSESSMENT AND PLAN:  Discussed the following assessment and plan:  Essential hypertension, benign - Plan: Comprehensive metabolic panel, Lipid panel, TSH, VITAMIN D 25 Hydroxy (Vit-D Deficiency, Fractures), Hemoglobin A1c, CBC with Differential/Platelet  Mixed hyperlipidemia - Plan: Lipid panel  Generalized anxiety disorder  Fatigue, unspecified type Problem List Items Addressed This Visit      Cardiovascular and Mediastinum   Essential hypertension, benign - Primary    BP Readings from Last 3 Encounters:  05/17/19 107/70  01/31/19 122/70  12/29/18 127/67   Has been controlled. Continue current regimen      Relevant Orders   Comprehensive metabolic panel   Lipid panel   TSH   VITAMIN D 25 Hydroxy (Vit-D Deficiency, Fractures)   Hemoglobin A1c   CBC with Differential/Platelet     Other   Fatigue    Please that this has resolved with patient returning to work.  However we did discuss including a thorough set of labs today.  She will let me know if the fatigue were to recur      Generalized anxiety disorder    Doing well on Zoloft.  We will continue       Hyperlipidemia   Relevant Orders   Lipid panel      -we discussed possible serious and likely etiologies, options for evaluation and workup, limitations of telemedicine visit vs in person visit, treatment, treatment risks and precautions. Pt prefers to treat via telemedicine empirically rather then risking or undertaking an in person visit at this moment. Patient agrees to seek prompt in person care if worsening, new symptoms arise, or if is not improving with treatment.   I discussed the assessment and treatment plan with the patient. The patient was provided an opportunity to ask questions and all were answered. The patient agreed with the plan and demonstrated an understanding of the instructions.   The patient was advised to call back or seek an in-person evaluation if the symptoms worsen or if the condition fails to improve as anticipated.  [name], FNP

## 2019-05-17 NOTE — Assessment & Plan Note (Signed)
Doing well on Zoloft.  We will continue

## 2019-05-17 NOTE — Assessment & Plan Note (Addendum)
BP Readings from Last 3 Encounters:  05/17/19 107/70  01/31/19 122/70  12/29/18 127/67   Has been controlled. Continue current regimen

## 2019-05-17 NOTE — Assessment & Plan Note (Addendum)
Pleased that this has resolved with patient returning to work.  However we did discuss including a thorough set of labs today.  She will let me know if the fatigue were to recur

## 2019-05-31 ENCOUNTER — Encounter: Payer: Self-pay | Admitting: Family

## 2019-06-04 ENCOUNTER — Other Ambulatory Visit: Payer: Self-pay | Admitting: Cardiovascular Disease

## 2019-06-05 ENCOUNTER — Other Ambulatory Visit: Payer: Medicare HMO

## 2019-06-09 ENCOUNTER — Other Ambulatory Visit: Payer: Self-pay

## 2019-06-09 ENCOUNTER — Other Ambulatory Visit (INDEPENDENT_AMBULATORY_CARE_PROVIDER_SITE_OTHER): Payer: Medicare HMO

## 2019-06-09 DIAGNOSIS — I10 Essential (primary) hypertension: Secondary | ICD-10-CM

## 2019-06-09 DIAGNOSIS — E782 Mixed hyperlipidemia: Secondary | ICD-10-CM | POA: Diagnosis not present

## 2019-06-09 LAB — CBC WITH DIFFERENTIAL/PLATELET
Basophils Absolute: 0 10*3/uL (ref 0.0–0.1)
Basophils Relative: 0.6 % (ref 0.0–3.0)
Eosinophils Absolute: 0.1 10*3/uL (ref 0.0–0.7)
Eosinophils Relative: 1.9 % (ref 0.0–5.0)
HCT: 40.6 % (ref 36.0–46.0)
Hemoglobin: 13.3 g/dL (ref 12.0–15.0)
Lymphocytes Relative: 24.4 % (ref 12.0–46.0)
Lymphs Abs: 1.8 10*3/uL (ref 0.7–4.0)
MCHC: 32.8 g/dL (ref 30.0–36.0)
MCV: 96.1 fl (ref 78.0–100.0)
Monocytes Absolute: 0.9 10*3/uL (ref 0.1–1.0)
Monocytes Relative: 11.5 % (ref 3.0–12.0)
Neutro Abs: 4.7 10*3/uL (ref 1.4–7.7)
Neutrophils Relative %: 61.6 % (ref 43.0–77.0)
Platelets: 196 10*3/uL (ref 150.0–400.0)
RBC: 4.22 Mil/uL (ref 3.87–5.11)
RDW: 13.7 % (ref 11.5–15.5)
WBC: 7.6 10*3/uL (ref 4.0–10.5)

## 2019-06-09 LAB — COMPREHENSIVE METABOLIC PANEL
ALT: 54 U/L — ABNORMAL HIGH (ref 0–35)
AST: 43 U/L — ABNORMAL HIGH (ref 0–37)
Albumin: 4.2 g/dL (ref 3.5–5.2)
Alkaline Phosphatase: 112 U/L (ref 39–117)
BUN: 15 mg/dL (ref 6–23)
CO2: 25 mEq/L (ref 19–32)
Calcium: 10 mg/dL (ref 8.4–10.5)
Chloride: 104 mEq/L (ref 96–112)
Creatinine, Ser: 0.79 mg/dL (ref 0.40–1.20)
GFR: 71.67 mL/min (ref >60.00)
Glucose, Bld: 96 mg/dL (ref 70–99)
Potassium: 4.4 mEq/L (ref 3.5–5.1)
Sodium: 135 mEq/L (ref 135–145)
Total Bilirubin: 0.5 mg/dL (ref 0.2–1.2)
Total Protein: 6.6 g/dL (ref 6.0–8.3)

## 2019-06-09 LAB — LIPID PANEL
Cholesterol: 119 mg/dL (ref 0–200)
HDL: 63.7 mg/dL (ref >39.00)
LDL Cholesterol: 42 mg/dL (ref 0–99)
NonHDL: 55.38
Total CHOL/HDL Ratio: 2
Triglycerides: 65 mg/dL (ref 0.0–149.0)
VLDL: 13 mg/dL (ref 0.0–40.0)

## 2019-06-09 LAB — HEMOGLOBIN A1C: Hgb A1c MFr Bld: 5.7 % (ref 4.6–6.5)

## 2019-06-09 LAB — TSH: TSH: 2.7 u[IU]/mL (ref 0.35–4.50)

## 2019-06-09 LAB — VITAMIN D 25 HYDROXY (VIT D DEFICIENCY, FRACTURES): VITD: 34.69 ng/mL (ref 30.00–100.00)

## 2019-06-12 ENCOUNTER — Encounter: Payer: Self-pay | Admitting: Family

## 2019-06-19 ENCOUNTER — Telehealth: Payer: Self-pay | Admitting: Lab

## 2019-06-19 ENCOUNTER — Telehealth: Payer: Self-pay

## 2019-06-19 DIAGNOSIS — R748 Abnormal levels of other serum enzymes: Secondary | ICD-10-CM

## 2019-06-19 NOTE — Telephone Encounter (Signed)
Call patient Referral has been placed.  Please have her call the office if she is not heard from [place] in regards to this in the next week

## 2019-06-19 NOTE — Telephone Encounter (Signed)
Called Pt No answer, left a VM to call office.

## 2019-06-19 NOTE — Telephone Encounter (Signed)
Patient called in response to lab results & patient would like to be referred to GI.

## 2019-06-19 NOTE — Telephone Encounter (Signed)
I called patient [name] that referral was placed. I asked that if she has not heard about appointment in the next 7-14 days to please call our office.

## 2019-06-22 ENCOUNTER — Encounter (INDEPENDENT_AMBULATORY_CARE_PROVIDER_SITE_OTHER): Payer: Medicare HMO | Admitting: Ophthalmology

## 2019-06-22 DIAGNOSIS — H26492 Other secondary cataract, left eye: Secondary | ICD-10-CM

## 2019-06-22 DIAGNOSIS — H353112 Nonexudative age-related macular degeneration, right eye, intermediate dry stage: Secondary | ICD-10-CM | POA: Diagnosis not present

## 2019-06-22 DIAGNOSIS — H2513 Age-related nuclear cataract, bilateral: Secondary | ICD-10-CM

## 2019-06-22 DIAGNOSIS — H353221 Exudative age-related macular degeneration, left eye, with active choroidal neovascularization: Secondary | ICD-10-CM

## 2019-06-22 DIAGNOSIS — H35033 Hypertensive retinopathy, bilateral: Secondary | ICD-10-CM

## 2019-06-22 DIAGNOSIS — H35373 Puckering of macula, bilateral: Secondary | ICD-10-CM

## 2019-06-22 DIAGNOSIS — I10 Essential (primary) hypertension: Secondary | ICD-10-CM | POA: Diagnosis not present

## 2019-06-27 ENCOUNTER — Encounter: Payer: Self-pay | Admitting: *Deleted

## 2019-07-20 ENCOUNTER — Ambulatory Visit (INDEPENDENT_AMBULATORY_CARE_PROVIDER_SITE_OTHER): Payer: Medicare HMO | Admitting: Gastroenterology

## 2019-07-20 ENCOUNTER — Encounter: Payer: Self-pay | Admitting: Gastroenterology

## 2019-07-20 DIAGNOSIS — R748 Abnormal levels of other serum enzymes: Secondary | ICD-10-CM

## 2019-07-20 NOTE — Progress Notes (Signed)
[name] [address]  [place]  [place], [place] [address]  Main: [phone]  Fax: [phone]   Gastroenterology Consultation  Referring Provider:     Burnard Hawthorne, FNP Primary Care Physician:  Burnard Hawthorne, FNP Reason for Consultation:    Elevated liver enzymes        HPI:   Virtual Visit via Video Note  I connected with patient on 07/20/19 at  1:15 PM EDT by video ([email or web address]) and verified that I am speaking with the correct person using two identifiers.   I discussed the limitations, risks, security and privacy concerns of performing an evaluation and management service by video and the availability of in person appointments. I also discussed with the patient that there may be a patient responsible charge related to this service. The patient expressed understanding and agreed to proceed.  Location of the patient: Home Location of provider: Home Participating persons: Patient and provider only (Nursing staff checked in patient via phone but were not physically involved in the video interaction - see their notes)   History of Present Illness: Chief Complaint  Patient presents with  . Elevated Hepatic Enzymes    [name] is a 74 y.o. y/o female referred for consultation & management  by Dr. Vidal Schwalbe, Yvetta Coder, FNP.  Patient with chronically elevated transaminases being seen for initial referral.  Denies having any work-up for it previously.  Reports drinking 1 or 2 beers 2-3 times a week.  No abdominal pain, weight loss, altered bowel habits, blood in stool, dysphagia.  Had an ultrasound in April 2019 due to abdominal pain which reported cholecystitis.  Was seen by Dr. Hampton Abbot of general surgery and patient chose conservative management at the time.  Symptoms have not reoccurred since then.  Last colonoscopy, 2014 for screening with Dr. Vira Agar, reported diverticulosis and internal hemorrhoids.  Repeat recommended in 10 years.  Past Medical  History:  Diagnosis Date  . Abnormal Pap smear of cervix    Dr. Prentice Docker   . Coronary artery disease    a. 06/2009 MI/PCI to LCX and LAD; b. 01/2011 Occluded stents-->CABG x 3 ([facility]): LIMA->LAD, VG->LCX, VG->RPDA; c. 01/2012 Cath: LM 50, LAD 95ost, 135m, LCX 95ost, 111m, RCA 2m, RPDA small, LIMA->LAD ok, VG->LCX ok, VG->RPDA 100-->Med rx.  . Degenerative disc disease, lumbar    L4/5 noted CT ab/pelvis 05/19/11   . Hyperlipidemia   . Hypertension    under control  . Macular degeneration   . Parathyroid adenoma    right 13 x 8 mm s/p resection   . PVD (peripheral vascular disease) ([place])    a. 01/2010 s/p Aortofemoral bypass.    Past Surgical History:  Procedure Laterality Date  . CARDIAC CATHETERIZATION  2013   [facility]  . CATARACT EXTRACTION    . COLPOSCOPY    . CORONARY ARTERY BYPASS GRAFT  02/07/2010   x 3  . FACIAL COSMETIC SURGERY  2013   Dr. Nadeen Landau  . FOOT SURGERY     right  . ILIO-FEMORAL BYPASS GRAFT  2011  . MITRAL VALVE REPAIR  01/2010  . ORIF ANKLE FRACTURE Right 10/08/2016   Procedure: OPEN REDUCTION INTERNAL FIXATION (ORIF) ANKLE FRACTURE;  Surgeon: Corky Mull, MD;  Location: [facility] ORS;  Service: Orthopedics;  Laterality: Right;  . ORIF WRIST FRACTURE Right 12/29/2018   Procedure: OPEN REDUCTION INTERNAL FIXATION (ORIF) WRIST FRACTURE;  Surgeon: Corky Mull, MD;  Location: [facility] ORS;  Service: Orthopedics;  Laterality:  Right;  [place] Kitchen PARATHYROIDECTOMY    . TUBAL LIGATION      Prior to Admission medications   Medication Sig Start Date End Date Taking? Authorizing Provider  aspirin 81 MG EC tablet Take 81 mg by mouth daily.    Yes [provider]  BESIVANCE 0.6 % SUSP  06/22/19  Yes [provider]  Cholecalciferol (VITAMIN D3) 1000 UNITS CAPS Take 1,000 Units by mouth daily.    Yes [provider]  ezetimibe (ZETIA) 10 MG tablet TAKE 1 TABLET BY MOUTH ONCE DAILY. 10/05/18  Yes Gollan, Kathlene November, MD  HYDROcodone-acetaminophen (NORCO)  7.5-325 MG tablet Take 1 tablet by mouth every 6 (six) hours as needed for moderate pain. 12/29/18  Yes Poggi, Marshall Cork, MD  metoprolol tartrate (LOPRESSOR) 25 MG tablet TAKE 1/2 TABLET BY MOUTH TWICE DAILY Patient taking differently: Take 12.5 mg by mouth 2 (two) times daily.  10/05/18  Yes Gollan, Kathlene November, MD  Multiple Vitamins-Minerals (PRESERVISION AREDS 2 PO) Take 1 capsule by mouth 2 (two) times daily.   Yes [provider]  pantoprazole (PROTONIX) 40 MG tablet TAKE 1 TABLET BY MOUTH ONCE DAILY. Patient taking differently: Take 40 mg by mouth daily.  10/17/18  Yes Burnard Hawthorne, FNP  Probiotic Product (RISA-BID PROBIOTIC PO) Take 250 mg by mouth daily.    Yes [provider]  rosuvastatin (CRESTOR) 40 MG tablet TAKE 1 TABLET BY MOUTH AT BEDTIME 06/05/19  Yes Gollan, Kathlene November, MD  sertraline (ZOLOFT) 100 MG tablet Take 1 tablet (100 mg total) by mouth daily. 12/12/18  Yes Burnard Hawthorne, FNP    Family History  Problem Relation Age of Onset  . Heart disease Father      Social History   Tobacco Use  . Smoking status: Former Smoker    Packs/day: 1.00    Years: 40.00    Pack years: 40.00    Types: Cigarettes    Quit date: 07/15/2009    Years since quitting: 10.0  . Smokeless tobacco: Never Used  . Tobacco comment: quit 06/2009 smoked from 20s to 2011 1.5 ppd no FH lung cancer   Substance Use Topics  . Alcohol use: Yes    Alcohol/week: 1.0 standard drinks    Types: 1 Standard drinks or equivalent per week    Comment: occ.  . Drug use: No    Allergies as of 07/20/2019  . (No Known Allergies)    Review of Systems:    All systems reviewed and negative except where noted in HPI.   Observations/Objective:  Labs: CBC    Component Value Date/Time   WBC 7.6 06/09/2019 1052   RBC 4.22 06/09/2019 1052   HGB 13.3 06/09/2019 1052   HGB 13.0 03/25/2017 1051   HCT 40.6 06/09/2019 1052   HCT 39.4 03/25/2017 1051   PLT 196.0 06/09/2019 1052   PLT 239  03/25/2017 1051   MCV 96.1 06/09/2019 1052   MCV 93 03/25/2017 1051   MCV 96 06/28/2012 1112   MCH 31.5 08/03/2017 1645   MCHC 32.8 06/09/2019 1052   RDW 13.7 06/09/2019 1052   RDW 15.6 (H) 03/25/2017 1051   RDW 14.2 06/28/2012 1112   LYMPHSABS 1.8 06/09/2019 1052   LYMPHSABS 1.8 03/25/2017 1051   LYMPHSABS 2.1 06/28/2012 1112   MONOABS 0.9 06/09/2019 1052   MONOABS 0.7 06/28/2012 1112   EOSABS 0.1 06/09/2019 1052   EOSABS 0.0 03/25/2017 1051   EOSABS 0.1 06/28/2012 1112   BASOSABS 0.0 06/09/2019 1052  BASOSABS 0.0 03/25/2017 1051   BASOSABS 0.1 06/28/2012 1112   CMP     Component Value Date/Time   NA 135 06/09/2019 1052   NA 138 03/25/2017 1051   NA 139 06/28/2012 1112   K 4.4 06/09/2019 1052   K 4.0 06/28/2012 1112   CL 104 06/09/2019 1052   CL 109 (H) 06/28/2012 1112   CO2 25 06/09/2019 1052   CO2 25 06/28/2012 1112   GLUCOSE 96 06/09/2019 1052   GLUCOSE 81 06/28/2012 1112   BUN 15 06/09/2019 1052   BUN 22 03/25/2017 1051   BUN 17 06/28/2012 1112   CREATININE 0.79 06/09/2019 1052   CREATININE 0.77 06/28/2012 1112   CALCIUM 10.0 06/09/2019 1052   CALCIUM 10.3 (H) 06/28/2012 1112   PROT 6.6 06/09/2019 1052   PROT 6.3 01/18/2018 0850   PROT 7.5 06/27/2011 1208   ALBUMIN 4.2 06/09/2019 1052   ALBUMIN 4.5 01/18/2018 0850   ALBUMIN 4.1 06/27/2011 1208   AST 43 (H) 06/09/2019 1052   AST 62 (H) 06/27/2011 1208   ALT 54 (H) 06/09/2019 1052   ALT 48 06/27/2011 1208   ALKPHOS 112 06/09/2019 1052   ALKPHOS 74 06/27/2011 1208   BILITOT 0.5 06/09/2019 1052   BILITOT 0.4 01/18/2018 0850   BILITOT 0.4 06/27/2011 1208   GFRNONAA >60 08/03/2017 1645   GFRNONAA >60 06/28/2012 1112   GFRAA >60 08/03/2017 1645   GFRAA >60 06/28/2012 1112    Imaging Studies: No results found.  Assessment and Plan:   [name] is a 74 y.o. y/o female has been referred for elevated liver enzymes  Assessment and Plan: Obtain blood work for elevated liver enzymes to rule out  autoimmune and viral hepatitis  Right upper quadrant ultrasound to evaluate gallbladder given previous history of cholecystitis which was managed conservatively, and also evaluated the liver  Patient advised against hepatotoxic drugs including limiting alcohol intake to only 1-2 drinks a week  Follow Up Instructions:   I discussed the assessment and treatment plan with the patient. The patient was provided an opportunity to ask questions and all were answered. The patient agreed with the plan and demonstrated an understanding of the instructions.   The patient was advised to call back or seek an in-person evaluation if the symptoms worsen or if the condition fails to improve as anticipated.  I provided 15 minutes of face-to-face time via video software during this encounter.  Additional time was spent in reviewing patient's chart, placing orders etc.   Virgel Manifold, MD  Speech recognition software was used to dictate the above note.

## 2019-07-24 ENCOUNTER — Ambulatory Visit: Payer: Medicare HMO | Attending: Gastroenterology

## 2019-07-27 ENCOUNTER — Encounter (INDEPENDENT_AMBULATORY_CARE_PROVIDER_SITE_OTHER): Payer: Medicare HMO | Admitting: Ophthalmology

## 2019-07-27 ENCOUNTER — Other Ambulatory Visit: Payer: Self-pay

## 2019-07-27 DIAGNOSIS — I10 Essential (primary) hypertension: Secondary | ICD-10-CM

## 2019-07-27 DIAGNOSIS — H353221 Exudative age-related macular degeneration, left eye, with active choroidal neovascularization: Secondary | ICD-10-CM

## 2019-07-27 DIAGNOSIS — H26492 Other secondary cataract, left eye: Secondary | ICD-10-CM

## 2019-07-27 DIAGNOSIS — H353112 Nonexudative age-related macular degeneration, right eye, intermediate dry stage: Secondary | ICD-10-CM

## 2019-07-27 DIAGNOSIS — H43813 Vitreous degeneration, bilateral: Secondary | ICD-10-CM

## 2019-07-27 DIAGNOSIS — H35033 Hypertensive retinopathy, bilateral: Secondary | ICD-10-CM | POA: Diagnosis not present

## 2019-08-22 ENCOUNTER — Ambulatory Visit: Payer: Medicare HMO | Admitting: Gastroenterology

## 2019-08-24 ENCOUNTER — Other Ambulatory Visit: Payer: Self-pay

## 2019-08-24 ENCOUNTER — Encounter (INDEPENDENT_AMBULATORY_CARE_PROVIDER_SITE_OTHER): Payer: Medicare HMO | Admitting: Ophthalmology

## 2019-08-24 DIAGNOSIS — H43813 Vitreous degeneration, bilateral: Secondary | ICD-10-CM

## 2019-08-24 DIAGNOSIS — I10 Essential (primary) hypertension: Secondary | ICD-10-CM | POA: Diagnosis not present

## 2019-08-24 DIAGNOSIS — H35033 Hypertensive retinopathy, bilateral: Secondary | ICD-10-CM

## 2019-08-24 DIAGNOSIS — H353112 Nonexudative age-related macular degeneration, right eye, intermediate dry stage: Secondary | ICD-10-CM | POA: Diagnosis not present

## 2019-08-24 DIAGNOSIS — H35372 Puckering of macula, left eye: Secondary | ICD-10-CM

## 2019-08-24 DIAGNOSIS — H353221 Exudative age-related macular degeneration, left eye, with active choroidal neovascularization: Secondary | ICD-10-CM | POA: Diagnosis not present

## 2019-09-05 ENCOUNTER — Other Ambulatory Visit: Payer: Self-pay | Admitting: Cardiovascular Disease

## 2019-09-14 ENCOUNTER — Ambulatory Visit: Payer: Medicare HMO | Admitting: Gastroenterology

## 2019-09-14 ENCOUNTER — Other Ambulatory Visit: Payer: Self-pay

## 2019-09-16 LAB — HEPATITIS C ANTIBODY (REFLEX): HCV Ab: <0.1 s/co ratio (ref 0.0–0.9)

## 2019-09-16 LAB — HEPATITIS A ANTIBODY, TOTAL: hep A Total Ab: NEGATIVE

## 2019-09-16 LAB — ANA: ANA Titer 1: NEGATIVE

## 2019-09-16 LAB — IGG 4: IgG, Subclass 4: 29 mg/dL (ref 2–96)

## 2019-09-16 LAB — HEPATITIS B SURFACE ANTIGEN: Hepatitis B Surface Ag: NEGATIVE

## 2019-09-16 LAB — HCV COMMENT:

## 2019-09-16 LAB — HEPATITIS B SURFACE ANTIBODY,QUALITATIVE: Hep B Surface Ab, Qual: NONREACTIVE

## 2019-09-16 LAB — CERULOPLASMIN: Ceruloplasmin: 3.4 mg/dL — ABNORMAL LOW (ref 19.0–39.0)

## 2019-09-16 LAB — HEPATITIS A ANTIBODY, IGM: Hep A IgM: NEGATIVE

## 2019-09-16 LAB — ANTI-MICROSOMAL ANTIBODY LIVER / KIDNEY: LKM1 Ab: 0.5 Units (ref 0.0–20.0)

## 2019-09-16 LAB — HEPATITIS B CORE ANTIBODY, TOTAL: Hep B Core Total Ab: NEGATIVE

## 2019-09-16 LAB — HEPATITIS B CORE ANTIBODY, IGM: Hep B C IgM: NEGATIVE

## 2019-09-16 LAB — MITOCHONDRIAL/SMOOTH MUSCLE AB PNL
Mitochondrial Ab: <20 Units (ref 0.0–20.0)
Smooth Muscle Ab: 3 Units (ref 0–19)

## 2019-09-16 LAB — FERRITIN: Ferritin: 264 ng/mL — ABNORMAL HIGH (ref 15–150)

## 2019-09-19 ENCOUNTER — Other Ambulatory Visit: Payer: Self-pay | Admitting: Gastroenterology

## 2019-09-20 ENCOUNTER — Other Ambulatory Visit: Payer: Self-pay

## 2019-09-20 ENCOUNTER — Telehealth: Payer: Self-pay

## 2019-09-20 NOTE — Telephone Encounter (Signed)
Called patient to let her know what her lab results were and told her that she is needing additional labs to help Dr. Bonna Gains rule out Wilson's disease. Patient understood and stated that she would come by later on today to have the labs drawn.  Patient was also informed that she would be referred to an ophthalmologist to be evaluated for [facility] rings. Patient stated that she already sees an ophthalmologist in [place] and that she would see them in October 12, 2019 and would mention about checking her for [facility] Rings.

## 2019-09-20 NOTE — Telephone Encounter (Signed)
-----   Message from Virgel Manifold, MD sent at 09/19/2019  2:12 PM EDT ----- Herb Grays please let the patient know, her ceruloplasmin level was low.  We need to evaluate further for Wilson's disease which can affect her liver and is an abnormality of copper accumulation.  I have ordered further lab tests, please ask her to get these done.  Please also refer her to an ophthalmologist for evaluation of "[facility] rings" which can occur in Wilson's disease

## 2019-09-22 ENCOUNTER — Ambulatory Visit
Admission: RE | Admit: 2019-09-22 | Discharge: 2019-09-22 | Disposition: A | Discharge status: Home / Self Care | Payer: Medicare HMO | Source: Ambulatory Visit | Attending: Gastroenterology | Admitting: Gastroenterology

## 2019-09-22 ENCOUNTER — Other Ambulatory Visit: Payer: Self-pay

## 2019-09-22 DIAGNOSIS — R748 Abnormal levels of other serum enzymes: Secondary | ICD-10-CM | POA: Diagnosis not present

## 2019-09-22 LAB — COPPER, SERUM: Copper: 17 ug/dL — ABNORMAL LOW (ref 80–158)

## 2019-09-22 IMAGING — US US ABDOMEN LIMITED
1 series · 13 of 25 positions shown · non-contrast
Comparison: [DATE].

CLINICAL DATA: Elevated liver enzymes

EXAM:
ULTRASOUND ABDOMEN LIMITED RIGHT UPPER QUADRANT

[Series 1: us abdomen limited · 0.15mm/px · 13 of 66 slices shown]
[im 1/66]
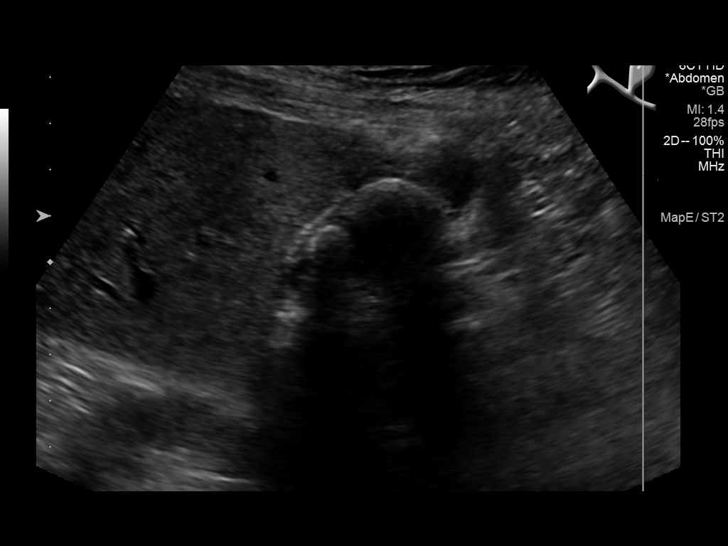
[im 6/66]
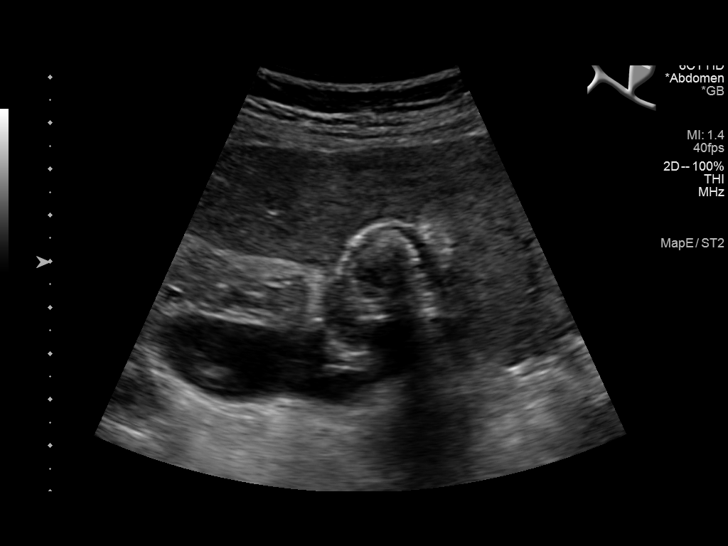
[im 11/66]
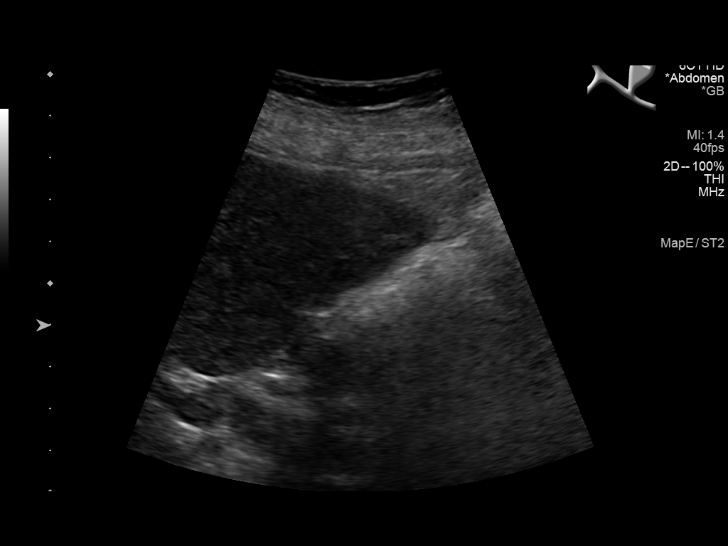
[im 17/66]
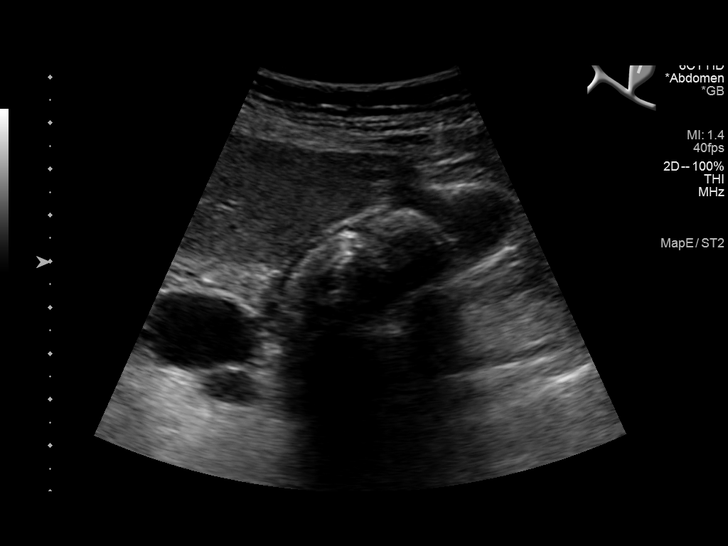
[im 22/66]
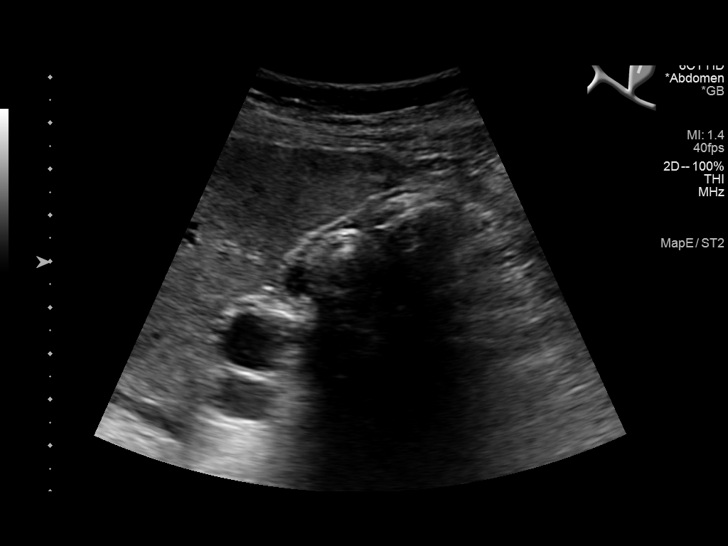
[im 28/66]
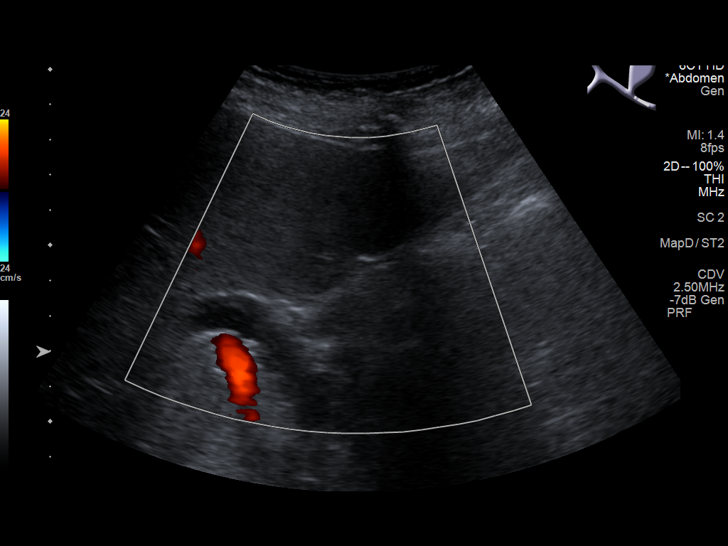
[im 33/66]
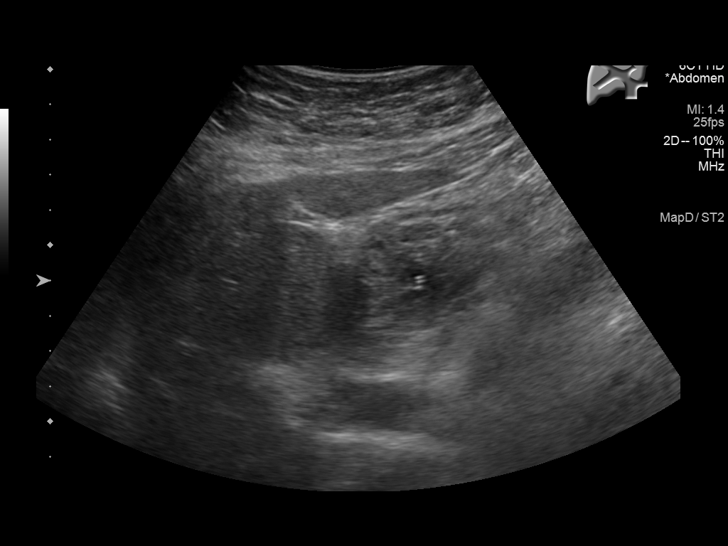
[im 38/66]
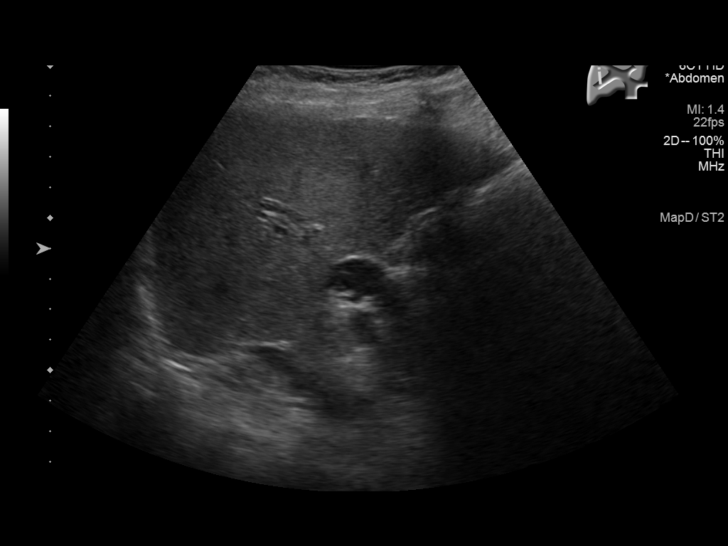
[im 44/66]
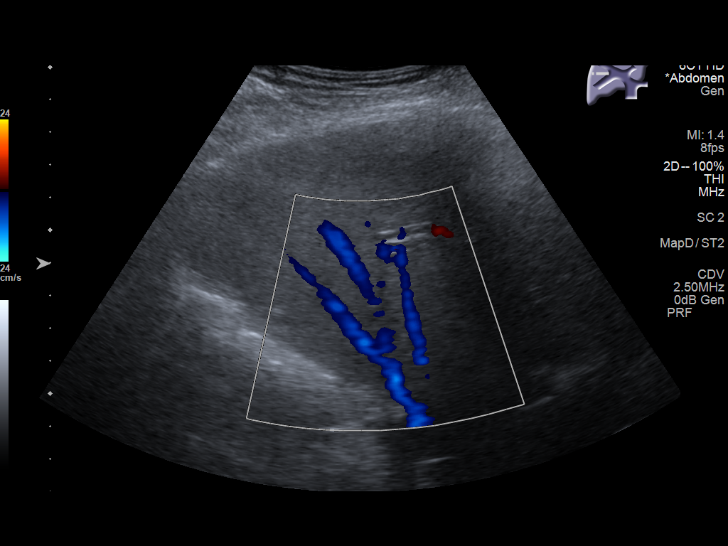
[im 49/66]
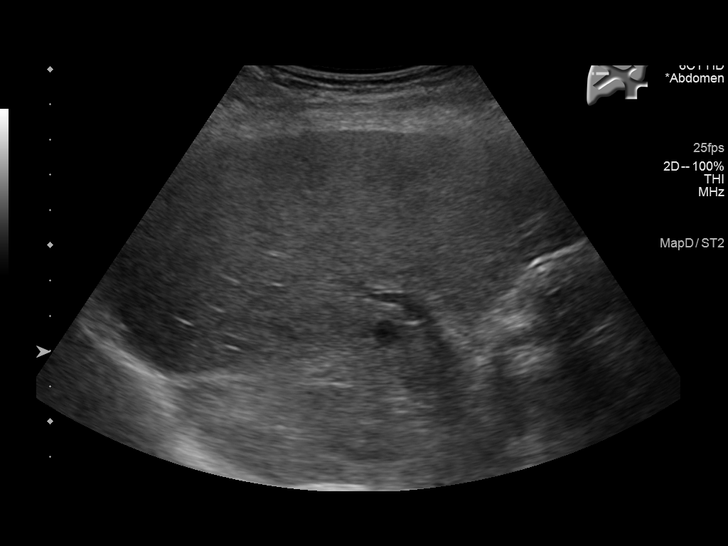
[im 55/66]
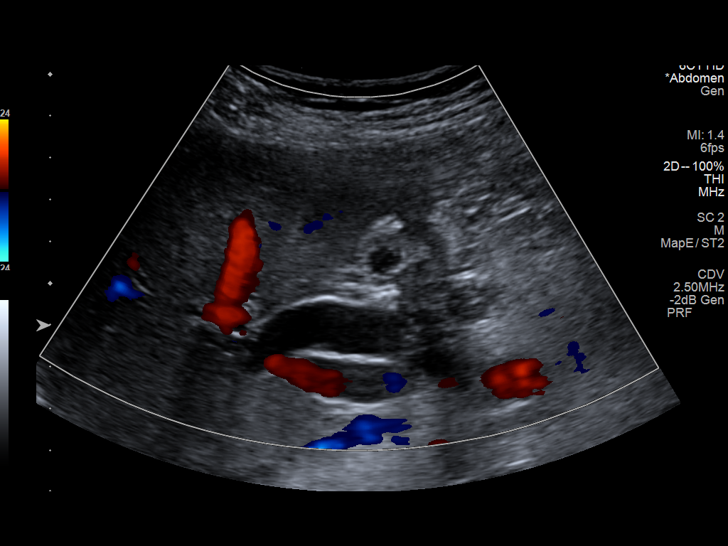
[im 60/66]
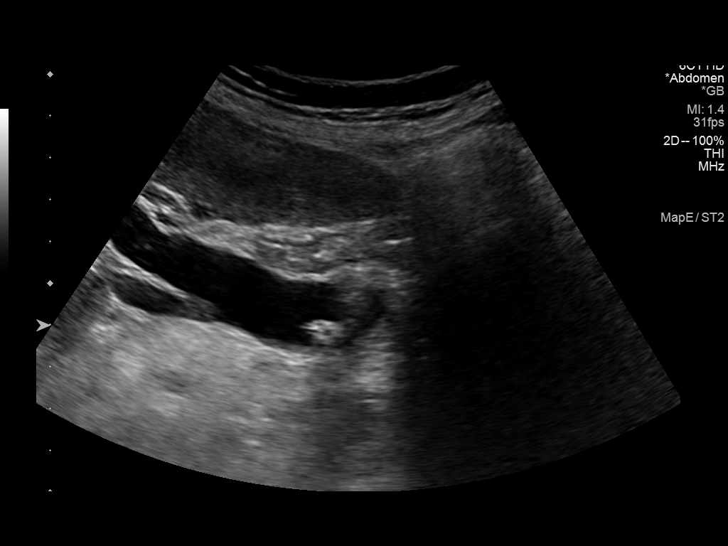
[im 66/66]
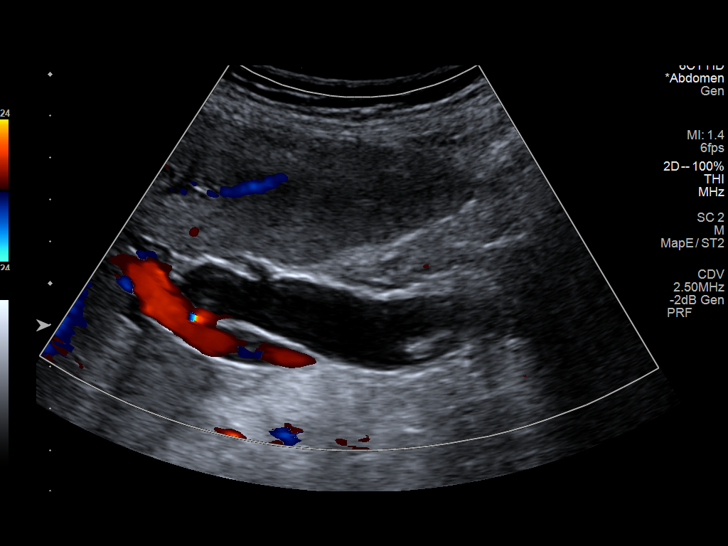

[13 of 25 positions shown; findings below may reference images not displayed]

FINDINGS: Gallbladder:

There are echogenic shadowing foci throughout the gallbladder,
largely occupying the gallbladder. Largest gallstone measures
cm. The gallbladder wall is mildly thickened and mildly edematous.
No appreciable pericholecystic fluid. No sonographic Murphy sign
noted by sonographer.

Common bile duct:

Diameter: 12 mm, dilated. There is diffuse intrahepatic and
extrahepatic biliary duct dilatation. There is choledocholithiasis
with several common bile duct calculi, largest measuring 1 cm in
length.

Liver:

No focal lesion identified. Within normal limits in parenchymal
echogenicity. Portal vein is patent on color Doppler imaging with
normal direction of blood flow towards the liver.

Other: None.
IMPRESSION: 1. Cholelithiasis with slightly thickened and edematous gallbladder
wall. Concern for a degree of acute cholecystitis.

2.  Diffuse biliary duct dilatation with choledocholithiasis noted.

These results will be called to the ordering clinician or
representative by the Radiologist Assistant, and communication
documented in the PACS or [REDACTED].

## 2019-09-25 ENCOUNTER — Telehealth: Payer: Self-pay

## 2019-09-25 ENCOUNTER — Telehealth: Payer: Self-pay | Admitting: Family

## 2019-09-25 DIAGNOSIS — K810 Acute cholecystitis: Secondary | ICD-10-CM

## 2019-09-25 DIAGNOSIS — K819 Cholecystitis, unspecified: Secondary | ICD-10-CM

## 2019-09-25 NOTE — Telephone Encounter (Signed)
Pt dropped of handicap placed to be signed. Placed in colored folder up front. Please call when completed. Her current one expires next month

## 2019-09-25 NOTE — Telephone Encounter (Signed)
-----   Message from Virgel Manifold, MD sent at 09/22/2019  1:47 PM EDT ----- Herb Grays, please order CMP and have patient come to the lab as soon as possible to get this done. Please also order MRCP for cholecystitis, cholelithiasis. Refer her to Dr. Hampton Abbot for cholecystitis  I discussed the results with the patient and she specifically states she has no abdominal pain. Reports good appetite. No nausea or vomiting. In the absence of any clinical signs or symptoms, question ultrasound findings. Therefore, we will repeat liver enzymes. Patient states she cannot go to the lab today to get labs drawn and will have to do it on Monday. I have cautioned her to have a low threshold for any symptoms. If she develops any abdominal pain, fever chills, nausea or vomiting, I have asked her to go to the ER immediately and she verbalized understanding. Otherwise, we will proceed with outpatient referral to surgery, MRCP for further evaluation of the bile duct and gallbladder, and continue follow-up in clinic for further evaluation of possible Wilson's disease.

## 2019-09-25 NOTE — Progress Notes (Signed)
Cardiology Office Note  Date:  09/26/2019   ID:  [name], DOB [birth year], MRN [id number]  PCP:  Burnard Hawthorne, FNP   Chief Complaint  Patient presents with  . office visit    pt has no complaints. Meds verbally reviewed w/ pt.     HPI:  [name] is a pleasant 74 year old woman with a history of  coronary artery disease, stent to her left circumflex and 2 stents to the LAD in March of 2011, aorto femoral bypass grafting in early October 2011,  with occlusion of her coronary stents in mid-October requiring bypass surgery at [facility] in mid to late October 2012.  Cath 2013: occluded SVG to PDA thyroid surgery March 2014 long history of smoking though stopped in March of 2011 plastic surgery on her face in the past She presents for routine followup of her coronary artery disease.  On her last clinic visit in 2020 she reported that she had retired She was Sedentary, no regular exercise program On today's visit reports that she is gone back to work working 16 hours a week She has lost 5 pounds, typically walks 1 to 2 miles a day  Denies anginal symptoms No shortness of breath on exertion  On crestor and zetia daily Lab work reviewed with her in detail HBa1c 5.7 Total cholesterol 119 LDL 42 AST 43 ALT 54  Work-up with GI for mildly elevated LFTs MRI scheduled this weekend  Chronic back pain No chest pain or shortness of breath on exertion  Gallbladder attack April 2019 Declined surgery Prior attack 7 years earlier  EKG personally reviewed by myself on todays visit Shows normal sinus rhythm rate 76 bpm  nonspecific ST abnormality  Other past medical history reviewed Last stress test 2013 Cardiac catheterization October 2013  Right foot fracture 3 breaks, extensive rehab   History of Macular degeneration, getting eye shots Had cataract surg, now eyes are doing well  Chronic back pain Previously buying products from [employer] for weight loss   cardiac  catheterization October 2013 showed LIMA to the LAD, vein graft to the left circumflex, vein graft to the PDA which is occluded that supplies a very small area. Left main has 50% disease, ostial LAD has 95% in-stent restenosis, mid LAD has 100% disease, 75% ostial circumflex, 100% mid circumflex, very small OM1, 30% RCA disease, ejection fraction 45%  Stress test shows suboptimal images due to GI activity on resting images, significant anterior wall ischemia in the mid to distal segments, reduced ejection fraction of 47%  Prior ventricular arrhythmia when her stents were placed in the Cath Lab requiring shock.  echocardiogram improved, EF greater than 40%.  Cardiac catheter report from July 15 2009 indicate she had a vision bare metal stent 2.75 x 28 mm in the LCX, mini vision bare metal stent 2.5 x 28 and 2.5 x 15 mm stent to the LAD.  DJD in her neck and back  PMH:   has a past medical history of Abnormal Pap smear of cervix, Coronary artery disease, Degenerative disc disease, lumbar, Hyperlipidemia, Hypertension, Macular degeneration, Parathyroid adenoma, and PVD (peripheral vascular disease) ([place]).  PSH:    Past Surgical History:  Procedure Laterality Date  . CARDIAC CATHETERIZATION  2013   [facility]  . CATARACT EXTRACTION    . COLPOSCOPY    . CORONARY ARTERY BYPASS GRAFT  02/07/2010   x 3  . FACIAL COSMETIC SURGERY  2013   Dr. Nadeen Landau  . FOOT SURGERY  right  . ILIO-FEMORAL BYPASS GRAFT  2011  . MITRAL VALVE REPAIR  01/2010  . ORIF ANKLE FRACTURE Right 10/08/2016   Procedure: OPEN REDUCTION INTERNAL FIXATION (ORIF) ANKLE FRACTURE;  Surgeon: Corky Mull, MD;  Location: [facility] ORS;  Service: Orthopedics;  Laterality: Right;  . ORIF WRIST FRACTURE Right 12/29/2018   Procedure: OPEN REDUCTION INTERNAL FIXATION (ORIF) WRIST FRACTURE;  Surgeon: Corky Mull, MD;  Location: [facility] ORS;  Service: Orthopedics;  Laterality: Right;  . PARATHYROIDECTOMY    . TUBAL LIGATION       Current Outpatient Medications  Medication Sig Dispense Refill  . aspirin 81 MG EC tablet Take 81 mg by mouth daily.     Marland Kitchen BESIVANCE 0.6 % SUSP     . Cholecalciferol (VITAMIN D3) 1000 UNITS CAPS Take 1,000 Units by mouth daily.     Marland Kitchen ezetimibe (ZETIA) 10 MG tablet TAKE 1 TABLET BY MOUTH ONCE DAILY. 90 tablet 3  . HYDROcodone-acetaminophen (NORCO) 7.5-325 MG tablet Take 1 tablet by mouth every 6 (six) hours as needed for moderate pain. 30 tablet 0  . metoprolol tartrate (LOPRESSOR) 25 MG tablet TAKE 1/2 TABLET BY MOUTH TWICE DAILY (Patient taking differently: Take 12.5 mg by mouth 2 (two) times daily. ) 90 tablet 3  . Multiple Vitamins-Minerals (PRESERVISION AREDS 2 PO) Take 1 capsule by mouth 2 (two) times daily.    . pantoprazole (PROTONIX) 40 MG tablet TAKE 1 TABLET BY MOUTH ONCE DAILY. (Patient taking differently: Take 40 mg by mouth daily. ) 90 tablet 4  . Probiotic Product (RISA-BID PROBIOTIC PO) Take 250 mg by mouth daily.     . rosuvastatin (CRESTOR) 40 MG tablet TAKE 1 TABLET BY MOUTH AT BEDTIME 90 tablet 0  . sertraline (ZOLOFT) 100 MG tablet Take 1 tablet (100 mg total) by mouth daily. 90 tablet 3   No current facility-administered medications for this visit.     Allergies:   Patient has no known allergies.   Social History:  The patient  reports that she quit smoking about 10 years ago. Her smoking use included cigarettes. She has a 40.00 pack-year smoking history. She has never used smokeless tobacco. She reports current alcohol use of about 1.0 standard drinks of alcohol per week. She reports that she does not use drugs.   Family History:   family history includes Heart disease in her father.    Review of Systems: Review of Systems  Constitutional: Negative.   HENT: Negative.   Respiratory: Negative.   Cardiovascular: Negative.   Gastrointestinal: Negative.   Musculoskeletal: Positive for back pain.  Neurological: Negative.   Psychiatric/Behavioral: Negative.    All other systems reviewed and are negative.   PHYSICAL EXAM: VS:  BP 102/64 (BP Location: Left Arm, Patient Position: Sitting, Cuff Size: Normal)   Pulse 76   Ht 5' (1.524 m)   Wt 135 lb 2 oz (61.3 kg)   SpO2 98%   BMI 26.39 kg/m  , BMI Body mass index is 26.39 kg/m. Constitutional:  oriented to person, place, and time. No distress.  HENT:  Head: Grossly normal Eyes:  no discharge. No scleral icterus.  Neck: No JVD, no carotid bruits  Cardiovascular: Regular rate and rhythm, no murmurs appreciated Pulmonary/Chest: Clear to auscultation bilaterally, no wheezes or rails Abdominal: Soft.  no distension.  no tenderness.  Musculoskeletal: Normal range of motion Neurological:  normal muscle tone. Coordination normal. No atrophy Skin: Skin warm and dry Psychiatric: normal affect, pleasant   Recent Labs: 06/09/2019:  Hemoglobin 13.3; Platelets 196.0; TSH 2.70 09/25/2019: ALT 163; BUN 14; Creatinine, Ser 0.77; Potassium 4.3; Sodium 139    Lipid Panel Lab Results  Component Value Date   CHOL 119 06/09/2019   HDL 63.70 06/09/2019   LDLCALC 42 06/09/2019   TRIG 65.0 06/09/2019      Wt Readings from Last 3 Encounters:  09/26/19 135 lb 2 oz (61.3 kg)  05/17/19 140 lb (63.5 kg)  03/02/19 140 lb (63.5 kg)      ASSESSMENT AND PLAN:  Mixed hyperlipidemia - Plan: EKG 12-Lead Recommend she continue on Zetia and Crestor Cholesterol is at goal on the current lipid regimen. No changes to the medications were made.  Coronary atherosclerosis of autologous vein bypass graft without angina -  Currently with no symptoms of angina. No further workup at this time. Continue current medication regimen.  PVD (peripheral vascular disease) ([facility]) -  Lipids at goal  Essential hypertension, benign -  Blood pressure is well controlled on today's visit. No changes made to the medications.  Class 1 obesity due to excess calories without serious comorbidity with body mass index (BMI) of 33.0 to  33.9 in adult Weight down 5 pouns Active, working 16 hours a week   Total encounter time more than 25 minutes  Greater than 50% was spent in counseling and coordination of care with the patient   Disposition:   F/U  12 months   Orders Placed This Encounter  Procedures  . EKG 12-Lead    Signed, Esmond Plants, M.D., Ph.D. 09/26/2019  [place], [place]

## 2019-09-25 NOTE — Telephone Encounter (Signed)
Patient was informed that she will be needing labs, MRCP and referral to general surgery for cholecystitis via MyChart messages.

## 2019-09-26 ENCOUNTER — Encounter: Payer: Self-pay | Admitting: Cardiovascular Disease

## 2019-09-26 ENCOUNTER — Other Ambulatory Visit: Payer: Self-pay

## 2019-09-26 ENCOUNTER — Ambulatory Visit: Payer: Medicare HMO | Admitting: Cardiovascular Disease

## 2019-09-26 VITALS — BP 102/64 | HR 76 | Ht 60.0 in | Wt 135.1 lb

## 2019-09-26 DIAGNOSIS — I25118 Atherosclerotic heart disease of native coronary artery with other forms of angina pectoris: Secondary | ICD-10-CM

## 2019-09-26 DIAGNOSIS — Z87891 Personal history of nicotine dependence: Secondary | ICD-10-CM | POA: Diagnosis not present

## 2019-09-26 DIAGNOSIS — I10 Essential (primary) hypertension: Secondary | ICD-10-CM

## 2019-09-26 DIAGNOSIS — E782 Mixed hyperlipidemia: Secondary | ICD-10-CM | POA: Diagnosis not present

## 2019-09-26 DIAGNOSIS — I739 Peripheral vascular disease, unspecified: Secondary | ICD-10-CM

## 2019-09-26 LAB — COMPREHENSIVE METABOLIC PANEL
ALT: 163 IU/L — ABNORMAL HIGH (ref 0–32)
AST: 117 IU/L — ABNORMAL HIGH (ref 0–40)
Albumin/Globulin Ratio: 1.9 (ref 1.2–2.2)
Albumin: 4.2 g/dL (ref 3.7–4.7)
Alkaline Phosphatase: 165 IU/L — ABNORMAL HIGH (ref 48–121)
BUN/Creatinine Ratio: 18 (ref 12–28)
BUN: 14 mg/dL (ref 8–27)
Bilirubin Total: 0.3 mg/dL (ref 0.0–1.2)
CO2: 22 mmol/L (ref 20–29)
Calcium: 10.2 mg/dL (ref 8.7–10.3)
Chloride: 103 mmol/L (ref 96–106)
Creatinine, Ser: 0.77 mg/dL (ref 0.57–1.00)
GFR calc Af Amer: 90 mL/min/{1.73_m2} (ref >59)
GFR calc non Af Amer: 78 mL/min/{1.73_m2} (ref >59)
Globulin, Total: 2.2 g/dL (ref 1.5–4.5)
Glucose: 115 mg/dL — ABNORMAL HIGH (ref 65–99)
Potassium: 4.3 mmol/L (ref 3.5–5.2)
Sodium: 139 mmol/L (ref 134–144)
Total Protein: 6.4 g/dL (ref 6.0–8.5)

## 2019-09-26 NOTE — Patient Instructions (Addendum)

## 2019-09-26 NOTE — Telephone Encounter (Signed)
-----   Message from Virgel Manifold, MD sent at 09/22/2019  1:47 PM EDT ----- [name], please order CMP and have patient come to the lab as soon as possible to get this done. Please also order MRCP for cholecystitis, cholelithiasis. Refer her to Dr. Hampton Abbot for cholecystitis  I discussed the results with the patient and she specifically states she has no abdominal pain. Reports good appetite. No nausea or vomiting. In the absence of any clinical signs or symptoms, question ultrasound findings. Therefore, we will repeat liver enzymes. Patient states she cannot go to the lab today to get labs drawn and will have to do it on Monday. I have cautioned her to have a low threshold for any symptoms. If she develops any abdominal pain, fever chills, nausea or vomiting, I have asked her to go to the ER immediately and she verbalized understanding. Otherwise, we will proceed with outpatient referral to surgery, MRCP for further evaluation of the bile duct and gallbladder, and continue follow-up in clinic for further evaluation of possible Wilson's disease.

## 2019-09-26 NOTE — Telephone Encounter (Signed)
Patient was informed and the referral, lab and image was done.

## 2019-09-28 ENCOUNTER — Other Ambulatory Visit: Payer: Self-pay

## 2019-09-28 ENCOUNTER — Encounter: Payer: Self-pay | Admitting: Gastroenterology

## 2019-09-28 ENCOUNTER — Ambulatory Visit: Payer: Medicare HMO | Admitting: Gastroenterology

## 2019-09-28 DIAGNOSIS — R748 Abnormal levels of other serum enzymes: Secondary | ICD-10-CM | POA: Diagnosis not present

## 2019-09-28 NOTE — H&P (View-Only) (Signed)
Vonda Antigua, MD [address]  [place]  [place], [place] [address]  Main: [phone]  Fax: [phone]   Primary Care Physician: Burnard Hawthorne, FNP   Chief Complaint  Patient presents with  . Elevated Hepatic Enzymes    Patient is here for results.    HPI: [name] is a 74 y.o. female here for follow-up of elevated liver enzymes.  Patient denies any abdominal pain, nausea or vomiting.  Lab work shows low serum copper and ceruloplasmin levels.  Patient has an appointment with ophthalmologist in the next couple weeks.  Ultrasound showed thickened gallbladder, diffuse biliary duct dilation and choledocholithiasis.  Denies any fever chills, nausea vomiting, postprandial pain.  Current Outpatient Medications  Medication Sig Dispense Refill  . aspirin 81 MG EC tablet Take 81 mg by mouth daily.     Marland Kitchen BESIVANCE 0.6 % SUSP     . Cholecalciferol (VITAMIN D3) 1000 UNITS CAPS Take 1,000 Units by mouth daily.     Marland Kitchen doxylamine, Sleep, (SLEEP AID) 25 MG tablet Take 25 mg by mouth at bedtime as needed.    . ezetimibe (ZETIA) 10 MG tablet TAKE 1 TABLET BY MOUTH ONCE DAILY. 90 tablet 3  . HYDROcodone-acetaminophen (NORCO) 7.5-325 MG tablet Take 1 tablet by mouth every 6 (six) hours as needed for moderate pain. 30 tablet 0  . metoprolol tartrate (LOPRESSOR) 25 MG tablet TAKE 1/2 TABLET BY MOUTH TWICE DAILY (Patient taking differently: Take 12.5 mg by mouth 2 (two) times daily. ) 90 tablet 3  . Multiple Vitamins-Minerals (PRESERVISION AREDS 2 PO) Take 1 capsule by mouth 2 (two) times daily.    . pantoprazole (PROTONIX) 40 MG tablet TAKE 1 TABLET BY MOUTH ONCE DAILY. (Patient taking differently: Take 40 mg by mouth daily. ) 90 tablet 4  . Probiotic Product (RISA-BID PROBIOTIC PO) Take 250 mg by mouth daily.     . rosuvastatin (CRESTOR) 40 MG tablet TAKE 1 TABLET BY MOUTH AT BEDTIME 90 tablet 0  . sertraline (ZOLOFT) 100 MG tablet Take 1 tablet (100 mg total) by mouth daily. 90  tablet 3   No current facility-administered medications for this visit.    Allergies as of 09/28/2019  . (No Known Allergies)    ROS:  General: Negative for anorexia, weight loss, fever, chills, fatigue, weakness. ENT: Negative for hoarseness, difficulty swallowing , nasal congestion. CV: Negative for chest pain, angina, palpitations, dyspnea on exertion, peripheral edema.  Respiratory: Negative for dyspnea at rest, dyspnea on exertion, cough, sputum, wheezing.  GI: See history of present illness. GU:  Negative for dysuria, hematuria, urinary incontinence, urinary frequency, nocturnal urination.  Endo: Negative for unusual weight change.    Physical Examination:   There were no vitals taken for this visit.  General: Well-nourished, well-developed in no acute distress.  Eyes: No icterus. Conjunctivae pink. Mouth: Oropharyngeal mucosa moist and pink , no lesions erythema or exudate. Neck: Supple, Trachea midline Abdomen: Bowel sounds are normal, nontender, nondistended, no hepatosplenomegaly or masses, no abdominal bruits or hernia , no rebound or guarding.   Extremities: No lower extremity edema. No clubbing or deformities. Neuro: Alert and oriented x 3.  Grossly intact. Skin: Warm and dry, no jaundice.   Psych: Alert and cooperative, normal mood and affect.   Labs: CMP     Component Value Date/Time   NA 139 09/25/2019 1439   NA 139 06/28/2012 1112   K 4.3 09/25/2019 1439   K 4.0 06/28/2012 1112   CL  103 09/25/2019 1439   CL 109 (H) 06/28/2012 1112   CO2 22 09/25/2019 1439   CO2 25 06/28/2012 1112   GLUCOSE 115 (H) 09/25/2019 1439   GLUCOSE 96 06/09/2019 1052   GLUCOSE 81 06/28/2012 1112   BUN 14 09/25/2019 1439   BUN 17 06/28/2012 1112   CREATININE 0.77 09/25/2019 1439   CREATININE 0.77 06/28/2012 1112   CALCIUM 10.2 09/25/2019 1439   CALCIUM 10.3 (H) 06/28/2012 1112   PROT 6.4 09/25/2019 1439   PROT 7.5 06/27/2011 1208   ALBUMIN 4.2 09/25/2019 1439   ALBUMIN  4.1 06/27/2011 1208   AST 117 (H) 09/25/2019 1439   AST 62 (H) 06/27/2011 1208   ALT 163 (H) 09/25/2019 1439   ALT 48 06/27/2011 1208   ALKPHOS 165 (H) 09/25/2019 1439   ALKPHOS 74 06/27/2011 1208   BILITOT 0.3 09/25/2019 1439   BILITOT 0.4 06/27/2011 1208   GFRNONAA 78 09/25/2019 1439   GFRNONAA >60 06/28/2012 1112   GFRAA 90 09/25/2019 1439   GFRAA >60 06/28/2012 1112   Lab Results  Component Value Date   WBC 7.6 06/09/2019   HGB 13.3 06/09/2019   HCT 40.6 06/09/2019   MCV 96.1 06/09/2019   PLT 196.0 06/09/2019    Imaging Studies: US Abdomen Limited RUQ  Result Date: 09/22/2019 CLINICAL DATA:  Elevated liver enzymes EXAM: ULTRASOUND ABDOMEN LIMITED RIGHT UPPER QUADRANT COMPARISON:  August 04, 2019. FINDINGS: Gallbladder: There are echogenic shadowing foci throughout the gallbladder, largely occupying the gallbladder. Largest gallstone measures 2.0 cm. The gallbladder wall is mildly thickened and mildly edematous. No appreciable pericholecystic fluid. No sonographic Murphy sign noted by sonographer. Common bile duct: Diameter: 12 mm, dilated. There is diffuse intrahepatic and extrahepatic biliary duct dilatation. There is choledocholithiasis with several common bile duct calculi, largest measuring 1 cm in length. Liver: No focal lesion identified. Within normal limits in parenchymal echogenicity. Portal vein is patent on color Doppler imaging with normal direction of blood flow towards the liver. Other: None. IMPRESSION: 1. Cholelithiasis with slightly thickened and edematous gallbladder wall. Concern for a degree of acute cholecystitis. 2.  Diffuse biliary duct dilatation with choledocholithiasis noted. These results will be called to the ordering clinician or representative by the Radiologist Assistant, and communication documented in the PACS or [employer]. Electronically Signed   By: Lowella Grip III M.D.   On: 09/22/2019 11:37    Assessment and Plan:   [name] is a  74 y.o. y/o female here for follow-up of elevated liver enzymes  Patient will be set up for ERCP with Dr. Allen Norris.  Case discussed with him already.  Patient has been set up for appointment with Dr. 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  If she develops any abdominal pain, nausea vomiting, fever chills, or any acute changes of concern, she was advised to both the ER and she verbalized understanding  Dr Vonda Antigua

## 2019-09-28 NOTE — Progress Notes (Signed)
Brittany Antigua, MD [address]  [place]  [place], [place] [address]  Main: [phone]  Fax: [phone]   Primary Care Physician: Burnard Hawthorne, FNP   Chief Complaint  Patient presents with  . Elevated Hepatic Enzymes    Patient is here for results.    HPI: [name] is a 74 y.o. female here for follow-up of elevated liver enzymes.  Patient denies any abdominal pain, nausea or vomiting.  Lab work shows low serum copper and ceruloplasmin levels.  Patient has an appointment with ophthalmologist in the next couple weeks.  Ultrasound showed thickened gallbladder, diffuse biliary duct dilation and choledocholithiasis.  Denies any fever chills, nausea vomiting, postprandial pain.  Current Outpatient Medications  Medication Sig Dispense Refill  . aspirin 81 MG EC tablet Take 81 mg by mouth daily.     Marland Kitchen BESIVANCE 0.6 % SUSP     . Cholecalciferol (VITAMIN D3) 1000 UNITS CAPS Take 1,000 Units by mouth daily.     Marland Kitchen doxylamine, Sleep, (SLEEP AID) 25 MG tablet Take 25 mg by mouth at bedtime as needed.    . ezetimibe (ZETIA) 10 MG tablet TAKE 1 TABLET BY MOUTH ONCE DAILY. 90 tablet 3  . HYDROcodone-acetaminophen (NORCO) 7.5-325 MG tablet Take 1 tablet by mouth every 6 (six) hours as needed for moderate pain. 30 tablet 0  . metoprolol tartrate (LOPRESSOR) 25 MG tablet TAKE 1/2 TABLET BY MOUTH TWICE DAILY (Patient taking differently: Take 12.5 mg by mouth 2 (two) times daily. ) 90 tablet 3  . Multiple Vitamins-Minerals (PRESERVISION AREDS 2 PO) Take 1 capsule by mouth 2 (two) times daily.    . pantoprazole (PROTONIX) 40 MG tablet TAKE 1 TABLET BY MOUTH ONCE DAILY. (Patient taking differently: Take 40 mg by mouth daily. ) 90 tablet 4  . Probiotic Product (RISA-BID PROBIOTIC PO) Take 250 mg by mouth daily.     . rosuvastatin (CRESTOR) 40 MG tablet TAKE 1 TABLET BY MOUTH AT BEDTIME 90 tablet 0  . sertraline (ZOLOFT) 100 MG tablet Take 1 tablet (100 mg total) by mouth daily. 90  tablet 3   No current facility-administered medications for this visit.    Allergies as of 09/28/2019  . (No Known Allergies)    ROS:  General: Negative for anorexia, weight loss, fever, chills, fatigue, weakness. ENT: Negative for hoarseness, difficulty swallowing , nasal congestion. CV: Negative for chest pain, angina, palpitations, dyspnea on exertion, peripheral edema.  Respiratory: Negative for dyspnea at rest, dyspnea on exertion, cough, sputum, wheezing.  GI: See history of present illness. GU:  Negative for dysuria, hematuria, urinary incontinence, urinary frequency, nocturnal urination.  Endo: Negative for unusual weight change.    Physical Examination:   There were no vitals taken for this visit.  General: Well-nourished, well-developed in no acute distress.  Eyes: No icterus. Conjunctivae pink. Mouth: Oropharyngeal mucosa moist and pink , no lesions erythema or exudate. Neck: Supple, Trachea midline Abdomen: Bowel sounds are normal, nontender, nondistended, no hepatosplenomegaly or masses, no abdominal bruits or hernia , no rebound or guarding.   Extremities: No lower extremity edema. No clubbing or deformities. Neuro: Alert and oriented x 3.  Grossly intact. Skin: Warm and dry, no jaundice.   Psych: Alert and cooperative, normal mood and affect.   Labs: CMP     Component Value Date/Time   NA 139 09/25/2019 1439   NA 139 06/28/2012 1112   K 4.3 09/25/2019 1439   K 4.0 06/28/2012 1112   CL  103 09/25/2019 1439   CL 109 (H) 06/28/2012 1112   CO2 22 09/25/2019 1439   CO2 25 06/28/2012 1112   GLUCOSE 115 (H) 09/25/2019 1439   GLUCOSE 96 06/09/2019 1052   GLUCOSE 81 06/28/2012 1112   BUN 14 09/25/2019 1439   BUN 17 06/28/2012 1112   CREATININE 0.77 09/25/2019 1439   CREATININE 0.77 06/28/2012 1112   CALCIUM 10.2 09/25/2019 1439   CALCIUM 10.3 (H) 06/28/2012 1112   PROT 6.4 09/25/2019 1439   PROT 7.5 06/27/2011 1208   ALBUMIN 4.2 09/25/2019 1439   ALBUMIN  4.1 06/27/2011 1208   AST 117 (H) 09/25/2019 1439   AST 62 (H) 06/27/2011 1208   ALT 163 (H) 09/25/2019 1439   ALT 48 06/27/2011 1208   ALKPHOS 165 (H) 09/25/2019 1439   ALKPHOS 74 06/27/2011 1208   BILITOT 0.3 09/25/2019 1439   BILITOT 0.4 06/27/2011 1208   GFRNONAA 78 09/25/2019 1439   GFRNONAA >60 06/28/2012 1112   GFRAA 90 09/25/2019 1439   GFRAA >60 06/28/2012 1112   Lab Results  Component Value Date   WBC 7.6 06/09/2019   HGB 13.3 06/09/2019   HCT 40.6 06/09/2019   MCV 96.1 06/09/2019   PLT 196.0 06/09/2019    Imaging Studies: US Abdomen Limited RUQ  Result Date: 09/22/2019 CLINICAL DATA:  Elevated liver enzymes EXAM: ULTRASOUND ABDOMEN LIMITED RIGHT UPPER QUADRANT COMPARISON:  August 04, 2019. FINDINGS: Gallbladder: There are echogenic shadowing foci throughout the gallbladder, largely occupying the gallbladder. Largest gallstone measures 2.0 cm. The gallbladder wall is mildly thickened and mildly edematous. No appreciable pericholecystic fluid. No sonographic Murphy sign noted by sonographer. Common bile duct: Diameter: 12 mm, dilated. There is diffuse intrahepatic and extrahepatic biliary duct dilatation. There is choledocholithiasis with several common bile duct calculi, largest measuring 1 cm in length. Liver: No focal lesion identified. Within normal limits in parenchymal echogenicity. Portal vein is patent on color Doppler imaging with normal direction of blood flow towards the liver. Other: None. IMPRESSION: 1. Cholelithiasis with slightly thickened and edematous gallbladder wall. Concern for a degree of acute cholecystitis. 2.  Diffuse biliary duct dilatation with choledocholithiasis noted. These results will be called to the ordering clinician or representative by the Radiologist Assistant, and communication documented in the PACS or [employer]. Electronically Signed   By: Lowella Grip III M.D.   On: 09/22/2019 11:37    Assessment and Plan:   [name] is a  74 y.o. y/o female here for follow-up of elevated liver enzymes  Patient will be set up for ERCP with Dr. Allen Norris.  Case discussed with him already.  Patient has been set up for appointment with Dr. 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  If she develops any abdominal pain, nausea vomiting, fever chills, or any acute changes of concern, she was advised to both the ER and she verbalized understanding  Dr [name]

## 2019-09-29 NOTE — Telephone Encounter (Signed)
Form is placed inside your [facility].

## 2019-10-02 LAB — COPPER, URINE - RANDOM OR 24 HOUR
Copper / Creatinine Ratio: 26 ug/g creat (ref 0–49)
Copper, 24H Ur: 12 ug/24 hr (ref 3–35)
Copper, Ur: 11 ug/L
Creatinine(Crt),U: 0.42 g/L (ref 0.30–3.00)

## 2019-10-02 NOTE — Telephone Encounter (Signed)
Signed form at your desk

## 2019-10-02 NOTE — Telephone Encounter (Signed)
Mailed to pt. Pt aware.

## 2019-10-03 ENCOUNTER — Other Ambulatory Visit
Admission: RE | Admit: 2019-10-03 | Discharge: 2019-10-03 | Disposition: A | Discharge status: Home / Self Care | Payer: Medicare HMO | Source: Ambulatory Visit | Attending: Gastroenterology | Admitting: Gastroenterology

## 2019-10-03 ENCOUNTER — Other Ambulatory Visit: Payer: Self-pay

## 2019-10-03 DIAGNOSIS — Z01812 Encounter for preprocedural laboratory examination: Secondary | ICD-10-CM | POA: Diagnosis present

## 2019-10-03 DIAGNOSIS — Z20822 Contact with and (suspected) exposure to covid-19: Secondary | ICD-10-CM | POA: Diagnosis not present

## 2019-10-04 LAB — SARS CORONAVIRUS 2 (TAT 6-24 HRS): SARS Coronavirus 2: NEGATIVE

## 2019-10-05 ENCOUNTER — Ambulatory Visit: Payer: Medicare HMO | Admitting: Anesthesiology

## 2019-10-05 ENCOUNTER — Encounter
Admission: RE | Disposition: A | Discharge status: Home / Self Care | Payer: Self-pay | Source: Home / Self Care | Attending: Gastroenterology

## 2019-10-05 ENCOUNTER — Other Ambulatory Visit: Payer: Self-pay

## 2019-10-05 ENCOUNTER — Ambulatory Visit
Admission: RE | Admit: 2019-10-05 | Discharge: 2019-10-05 | Disposition: A | Discharge status: Home / Self Care | Payer: Medicare HMO | Attending: Gastroenterology | Admitting: Gastroenterology

## 2019-10-05 ENCOUNTER — Encounter: Payer: Self-pay | Admitting: Gastroenterology

## 2019-10-05 ENCOUNTER — Ambulatory Visit: Payer: Medicare HMO

## 2019-10-05 DIAGNOSIS — F419 Anxiety disorder, unspecified: Secondary | ICD-10-CM | POA: Insufficient documentation

## 2019-10-05 DIAGNOSIS — I252 Old myocardial infarction: Secondary | ICD-10-CM | POA: Insufficient documentation

## 2019-10-05 DIAGNOSIS — Z951 Presence of aortocoronary bypass graft: Secondary | ICD-10-CM | POA: Diagnosis not present

## 2019-10-05 DIAGNOSIS — Z79899 Other long term (current) drug therapy: Secondary | ICD-10-CM | POA: Insufficient documentation

## 2019-10-05 DIAGNOSIS — I10 Essential (primary) hypertension: Secondary | ICD-10-CM | POA: Insufficient documentation

## 2019-10-05 DIAGNOSIS — Z87891 Personal history of nicotine dependence: Secondary | ICD-10-CM | POA: Diagnosis not present

## 2019-10-05 DIAGNOSIS — Z955 Presence of coronary angioplasty implant and graft: Secondary | ICD-10-CM | POA: Insufficient documentation

## 2019-10-05 DIAGNOSIS — K8050 Calculus of bile duct without cholangitis or cholecystitis without obstruction: Secondary | ICD-10-CM | POA: Diagnosis not present

## 2019-10-05 DIAGNOSIS — K219 Gastro-esophageal reflux disease without esophagitis: Secondary | ICD-10-CM | POA: Diagnosis not present

## 2019-10-05 DIAGNOSIS — Z7982 Long term (current) use of aspirin: Secondary | ICD-10-CM | POA: Diagnosis not present

## 2019-10-05 HISTORY — PX: ERCP: SHX5425

## 2019-10-05 HISTORY — DX: Gastro-esophageal reflux disease without esophagitis: K21.9

## 2019-10-05 SURGERY — ERCP, WITH INTERVENTION IF INDICATED
Anesthesia: General

## 2019-10-05 MED ORDER — INDOMETHACIN 50 MG RE SUPP
RECTAL | Status: AC
  Filled 2019-10-05: qty 1

## 2019-10-05 MED ORDER — EPHEDRINE SULFATE 50 MG/ML IJ SOLN
INTRAMUSCULAR | Status: DC | PRN
  Administered 2019-10-05: 10 mg via INTRAVENOUS

## 2019-10-05 MED ORDER — PROPOFOL 10 MG/ML IV BOLUS
INTRAVENOUS | Status: DC | PRN
  Administered 2019-10-05: 50 mg via INTRAVENOUS
  Administered 2019-10-05: 20 mg via INTRAVENOUS

## 2019-10-05 MED ORDER — INDOMETHACIN 50 MG RE SUPP
RECTAL | Status: AC
  Administered 2019-10-05: 100 mg via RECTAL
  Filled 2019-10-05: qty 1

## 2019-10-05 MED ORDER — INDOMETHACIN 50 MG RE SUPP: 100.0000 mg | Freq: Once | RECTAL | Status: AC

## 2019-10-05 MED ORDER — LACTATED RINGERS IV SOLN
INTRAVENOUS | Status: DC
  Administered 2019-10-05: 1000 mL via INTRAVENOUS

## 2019-10-05 MED ORDER — GLYCOPYRROLATE 0.2 MG/ML IJ SOLN
INTRAMUSCULAR | Status: DC | PRN
Start: 2019-10-05 — End: 2019-10-05
  Administered 2019-10-05: .2 mg via INTRAVENOUS

## 2019-10-05 MED ORDER — FENTANYL CITRATE (PF) 100 MCG/2ML IJ SOLN
INTRAMUSCULAR | Status: DC | PRN
  Administered 2019-10-05: 25 ug via INTRAVENOUS

## 2019-10-05 MED ORDER — FENTANYL CITRATE (PF) 100 MCG/2ML IJ SOLN
INTRAMUSCULAR | Status: AC
  Filled 2019-10-05: qty 2

## 2019-10-05 MED ORDER — PROPOFOL 500 MG/50ML IV EMUL
INTRAVENOUS | Status: DC | PRN
  Administered 2019-10-05: 120 ug/kg/min via INTRAVENOUS

## 2019-10-05 MED ORDER — PHENYLEPHRINE HCL (PRESSORS) 10 MG/ML IV SOLN
INTRAVENOUS | Status: DC | PRN
  Administered 2019-10-05: 80 ug via INTRAVENOUS

## 2019-10-05 NOTE — Transfer of Care (Signed)
Immediate Anesthesia Transfer of Care Note  Patient: [name]  Procedure(s) Performed: ENDOSCOPIC RETROGRADE CHOLANGIOPANCREATOGRAPHY (ERCP) (N/A )  Patient Location: PACU  Anesthesia Type:General  Level of Consciousness: awake, alert  and oriented  Airway & Oxygen Therapy: Patient Spontanous Breathing  Post-op Assessment: Report given to RN and Post -op Vital signs reviewed and stable  Post vital signs: Reviewed and stable  Last Vitals:  Vitals Value Taken Time  BP 122/66 10/05/19 1200  Temp 36.6 C 10/05/19 1157  Pulse 79 10/05/19 1202  Resp 21 10/05/19 1202  SpO2 98 % 10/05/19 1202  Vitals shown include unvalidated device data.  Last Pain:  Vitals:   10/05/19 1157  TempSrc: Temporal  PainSc: 0-No pain         Complications: No complications documented.

## 2019-10-05 NOTE — Op Note (Signed)
[facility] Gastroenterology Patient Name: [name] Procedure Date: 10/05/2019 11:08 AM MRN: [id number] Account #: [id number] Date of Birth: 14-Jan-1946 Admit Type: Outpatient Age: 74 Room: [facility] ENDO ROOM 4 Gender: Female Note Status: Finalized Procedure:             ERCP Indications:           Common bile duct stone(s), Bile duct stone(s) Providers:             Lucilla Lame MD, MD Referring MD:          Yvetta Coder. Arnett (Referring MD) Medicines:             Propofol per Anesthesia Complications:         No immediate complications. Procedure:             Pre-Anesthesia Assessment:                        - Prior to the procedure, a History and Physical was                         performed, and patient medications and allergies were                         reviewed. The patient's tolerance of previous                         anesthesia was also reviewed. The risks and benefits                         of the procedure and the sedation options and risks                         were discussed with the patient. All questions were                         answered, and informed consent was obtained. Prior                         Anticoagulants: The patient has taken no previous                         anticoagulant or antiplatelet agents. ASA Grade                         Assessment: II - A patient with mild systemic disease.                         After reviewing the risks and benefits, the patient                         was deemed in satisfactory condition to undergo the                         procedure.                        After obtaining informed consent, the scope was passed  under direct vision. Throughout the procedure, the                         patient's blood pressure, pulse, and oxygen                         saturations were monitored continuously. The was                         introduced through the mouth, and used to  inject                         contrast into and used to inject contrast into the                         bile duct. The ERCP was accomplished without                         difficulty. The patient tolerated the procedure well. Findings:      The scout film was normal. The major papilla was on the rim of a       diverticulum. The bile duct was deeply cannulated with the short-nosed       traction sphincterotome. Contrast was injected. I personally interpreted       the bile duct images. There was brisk flow of contrast through the       ducts. Image quality was excellent. Contrast extended to the hepatic       ducts. A wire was passed into the biliary tree. A 5 mm biliary       sphincterotomy was made with a traction (standard) sphincterotome using       ERBE electrocautery. There was no post-sphincterotomy bleeding. The       biliary tree was swept with a 15 mm balloon starting at the bifurcation.       Two stones were removed. No stones remained. Impression:            - The major papilla was on the rim of a diverticulum.                        - Choledocholithiasis was found. Complete removal was                         accomplished by biliary sphincterotomy and balloon                         extraction.                        - A biliary sphincterotomy was performed.                        - The biliary tree was swept. Recommendation:        - Discharge patient to home.                        - Clear liquid diet today.                        - Continue present medications.                        -  Watch for pancreatitis, bleeding, perforation, and                         cholangitis. Procedure Code(s):     --- Professional ---                        [phone], Endoscopic retrograde cholangiopancreatography                         (ERCP); with removal of calculi/debris from                         biliary/pancreatic duct(s)                        43262, Endoscopic retrograde  cholangiopancreatography                         (ERCP); with sphincterotomy/papillotomy                        [phone], Endoscopic catheterization of the biliary                         ductal system, radiological supervision and                         interpretation Diagnosis Code(s):     --- Professional ---                        K80.50, Calculus of bile duct without cholangitis or                         cholecystitis without obstruction CPT copyright 2019 American Medical Association. All rights reserved. The codes documented in this report are preliminary and upon coder review may  be revised to meet current compliance requirements. Lucilla Lame MD, MD 10/05/2019 11:51:54 AM This report has been signed electronically. Number of Addenda: 0 Note Initiated On: 10/05/2019 11:08 AM Estimated Blood Loss:  Estimated blood loss: none.      [facility]

## 2019-10-05 NOTE — Interval H&P Note (Signed)
History and Physical Interval Note:  10/05/2019 10:54 AM  [name]  has presented today for surgery, with the diagnosis of Elevated liver enzymes R74.8.  The various methods of treatment have been discussed with the patient and family. After consideration of risks, benefits and other options for treatment, the patient has consented to  Procedure(s): ENDOSCOPIC RETROGRADE CHOLANGIOPANCREATOGRAPHY (ERCP) (N/A) as a surgical intervention.  The patient's history has been reviewed, patient examined, no change in status, stable for surgery.  I have reviewed the patient's chart and labs.  Questions were answered to the patient's satisfaction.     Mara Favero [employer]

## 2019-10-05 NOTE — Anesthesia Postprocedure Evaluation (Signed)
Anesthesia Post Note  Patient: [name]  Procedure(s) Performed: ENDOSCOPIC RETROGRADE CHOLANGIOPANCREATOGRAPHY (ERCP) (N/A )  Patient location during evaluation: Endoscopy Anesthesia Type: General Level of consciousness: awake and alert Pain management: pain level controlled Vital Signs Assessment: post-procedure vital signs reviewed and stable Respiratory status: spontaneous breathing and respiratory function stable Cardiovascular status: stable Anesthetic complications: no   No complications documented.   Last Vitals:  Vitals:   10/05/19 1217 10/05/19 1227  BP: 130/65 (!) 144/69  Pulse: 74 74  Resp: 11 19  Temp:    SpO2: 100% 95%    Last Pain:  Vitals:   10/05/19 1227  TempSrc:   PainSc: 6                  [name] K

## 2019-10-05 NOTE — Anesthesia Procedure Notes (Signed)
Date/Time: 10/05/2019 11:20 AM Performed by: Allean Found, CRNA Pre-anesthesia Checklist: Patient identified, Emergency Drugs available, Suction available and Timeout performed Oxygen Delivery Method: Nasal cannula Placement Confirmation: positive ETCO2

## 2019-10-05 NOTE — Anesthesia Preprocedure Evaluation (Addendum)
Anesthesia Evaluation  Patient identified by MRN, date of birth, ID band Patient awake    Reviewed: Allergy & Precautions, NPO status , Patient's Chart, lab work & pertinent test results  History of Anesthesia Complications Negative for: history of anesthetic complications  Airway Mallampati: II       Dental  (+) Chipped, Missing   Pulmonary neg sleep apnea, neg COPD, Not current smoker, former smoker,           Cardiovascular hypertension, Pt. on medications + Past MI, + Cardiac Stents and + CABG  + Valvular Problems/Murmurs (MV repair)      Neuro/Psych neg Seizures Anxiety    GI/Hepatic Neg liver ROS, GERD  Medicated and Controlled,  Endo/Other  neg diabetes  Renal/GU negative Renal ROS     Musculoskeletal   Abdominal   Peds  Hematology   Anesthesia Other Findings   Reproductive/Obstetrics                            Anesthesia Physical Anesthesia Plan  ASA: III  Anesthesia Plan: General   Post-op Pain Management:    Induction: Intravenous  PONV Risk Score and Plan: 3 and Propofol infusion and TIVA  Airway Management Planned: Nasal Cannula  Additional Equipment:   Intra-op Plan:   Post-operative Plan:   Informed Consent: I have reviewed the patients History and Physical, chart, labs and discussed the procedure including the risks, benefits and alternatives for the proposed anesthesia with the patient or authorized representative who has indicated his/her understanding and acceptance.       Plan Discussed with:   Anesthesia Plan Comments:         Anesthesia Quick Evaluation

## 2019-10-06 ENCOUNTER — Encounter: Payer: Self-pay | Admitting: Gastroenterology

## 2019-10-07 ENCOUNTER — Ambulatory Visit: Payer: Medicare HMO

## 2019-10-07 ENCOUNTER — Other Ambulatory Visit: Payer: Self-pay | Admitting: Cardiovascular Disease

## 2019-10-12 ENCOUNTER — Other Ambulatory Visit: Payer: Self-pay

## 2019-10-12 ENCOUNTER — Encounter (INDEPENDENT_AMBULATORY_CARE_PROVIDER_SITE_OTHER): Payer: Medicare HMO | Admitting: Ophthalmology

## 2019-10-12 DIAGNOSIS — H353221 Exudative age-related macular degeneration, left eye, with active choroidal neovascularization: Secondary | ICD-10-CM

## 2019-10-12 DIAGNOSIS — I10 Essential (primary) hypertension: Secondary | ICD-10-CM

## 2019-10-12 DIAGNOSIS — H353112 Nonexudative age-related macular degeneration, right eye, intermediate dry stage: Secondary | ICD-10-CM | POA: Diagnosis not present

## 2019-10-12 DIAGNOSIS — H43813 Vitreous degeneration, bilateral: Secondary | ICD-10-CM

## 2019-10-12 DIAGNOSIS — H35033 Hypertensive retinopathy, bilateral: Secondary | ICD-10-CM

## 2019-10-13 ENCOUNTER — Other Ambulatory Visit: Payer: Self-pay

## 2019-10-13 ENCOUNTER — Ambulatory Visit: Payer: Medicare HMO | Admitting: Surgery

## 2019-10-13 ENCOUNTER — Telehealth: Payer: Self-pay | Admitting: Family

## 2019-10-13 ENCOUNTER — Telehealth: Payer: Self-pay | Admitting: Emergency Medicine

## 2019-10-13 ENCOUNTER — Telehealth: Payer: Self-pay | Admitting: Surgery

## 2019-10-13 ENCOUNTER — Encounter: Payer: Self-pay | Admitting: Surgery

## 2019-10-13 VITALS — BP 122/66 | HR 80 | Temp 94.1°F | Resp 12 | Ht 60.0 in | Wt 135.8 lb

## 2019-10-13 DIAGNOSIS — K8050 Calculus of bile duct without cholangitis or cholecystitis without obstruction: Secondary | ICD-10-CM | POA: Diagnosis not present

## 2019-10-13 NOTE — Telephone Encounter (Signed)
Pt has been advised of Pre-Admission date/time, COVID Testing date and Surgery date.  Surgery Date: 10/26/19 Preadmission Testing Date: 10/18/19 (phone 8a-1p) Covid Testing Date: 10/24/19 - patient advised to go to the [facility] ([place]) between 8a-1p  Patient has been made aware to call [phone], between 1-3:00pm the day before surgery, to find out what time to arrive for surgery.

## 2019-10-13 NOTE — H&P (View-Only) (Signed)
10/13/2019  History of Present Illness: [name] is a 74 y.o. female with a history of cholecystitis in 08/04/17, treated with antibiotics as outpatient due to lack of OR availability.  After episode resolved, she elected not to have surgery.  She has been following GI recently for elevated LFTs.  There is suspicion that she could have Wilson Disease.  An U/S was done on 09/22/19 which showed cholelithiasis with slightly thickened and edematous gallbladder, but also with diffuse biliary duct dilatation and choledocholithiasis.  She underwent ERCP on 6/17 and two stones were removed from CBD.  She presents today for evaluation for cholecystectomy and liver biopsy.  Currently, she's not having any symptoms.  She reports that prior to the ERCP, she was having pain radiating to her back between shoulder blades, but otherwise did not have RUQ pain, nausea, or vomiting.  Denies having any fevers, chills, chest pain, shortness of breath.  Past Medical History: Past Medical History:  Diagnosis Date  . Abnormal Pap smear of cervix    Dr. Prentice Docker   . Coronary artery disease    a. 06/2009 MI/PCI to LCX and LAD; b. 01/2011 Occluded stents-->CABG x 3 ([facility]): LIMA->LAD, VG->LCX, VG->RPDA; c. 01/2012 Cath: LM 50, LAD 95ost, 174m LCX 95ost, 1069mRCA 3037mPDA small, LIMA->LAD ok, VG->LCX ok, VG->RPDA 100-->Med rx.  . Degenerative disc disease, lumbar    L4/5 noted CT ab/pelvis 05/19/11   . GERD (gastroesophageal reflux disease)   . Hyperlipidemia   . Hypertension    under control  . Macular degeneration   . [place] Parathyroid adenoma    right 13 x 8 mm s/p resection   . PVD (peripheral vascular disease) ([place]  a. 01/2010 s/p Aortofemoral bypass.     Past Surgical History: Past Surgical History:  Procedure Laterality Date  . CARDIAC CATHETERIZATION  2013   [facility]  . CATARACT EXTRACTION    . COLPOSCOPY    . CORONARY ARTERY BYPASS GRAFT  02/07/2010   x 3  . ERCP  N/A 10/05/2019   Procedure: ENDOSCOPIC RETROGRADE CHOLANGIOPANCREATOGRAPHY (ERCP);  Surgeon: WohLucilla LameD;  [facility];  Service: Endoscopy;  Laterality: N/A;  . FACIAL COSMETIC SURGERY  2013   Dr. MadNadeen Landau FOOT SURGERY     right  . ILIO-FEMORAL BYPASS GRAFT  2011  . MITRAL VALVE REPAIR  01/2010  . ORIF ANKLE FRACTURE Right 10/08/2016   Procedure: OPEN REDUCTION INTERNAL FIXATION (ORIF) ANKLE FRACTURE;  Surgeon: PogCorky MullD;  Location: [facility] ORS;  Service: Orthopedics;  Laterality: Right;  . ORIF WRIST FRACTURE Right 12/29/2018   Procedure: OPEN REDUCTION INTERNAL FIXATION (ORIF) WRIST FRACTURE;  Surgeon: PogCorky MullD;  Location: [facility] ORS;  Service: Orthopedics;  Laterality: Right;  . PARATHYROIDECTOMY    . TUBAL LIGATION      Home Medications: Prior to Admission medications   Medication Sig Start Date End Date Taking? Authorizing Provider  aspirin 81 MG EC tablet Take 81 mg by mouth daily.    Yes [provider]  BESIVANCE 0.6 % SUSP  06/22/19  Yes [provider]  Cholecalciferol (VITAMIN D3) 1000 UNITS CAPS Take 1,000 Units by mouth daily.    Yes [provider]  doxylamine, Sleep, (SLEEP AID) 25 MG tablet Take 25 mg by mouth at bedtime as needed.   Yes [provider]  ezetimibe (ZETIA) 10 MG tablet TAKE 1 TABLET BY MOUTH ONCE DAILY 10/09/19  Yes  Minna Merritts, MD  HYDROcodone-acetaminophen (NORCO) 7.5-325 MG tablet Take 1 tablet by mouth every 6 (six) hours as needed for moderate pain. 12/29/18  Yes Poggi, Marshall Cork, MD  metoprolol tartrate (LOPRESSOR) 25 MG tablet Take 0.5 tablets (12.5 mg total) by mouth 2 (two) times daily. 10/09/19  Yes Gollan, Kathlene November, MD  Multiple Vitamins-Minerals (PRESERVISION AREDS 2 PO) Take 1 capsule by mouth 2 (two) times daily.   Yes [provider]  pantoprazole (PROTONIX) 40 MG tablet TAKE 1 TABLET BY MOUTH ONCE DAILY. Patient taking differently: Take 40 mg by mouth daily.   10/17/18  Yes Burnard Hawthorne, FNP  Probiotic Product (RISA-BID PROBIOTIC PO) Take 250 mg by mouth daily.    Yes [provider]  rosuvastatin (CRESTOR) 40 MG tablet TAKE 1 TABLET BY MOUTH AT BEDTIME 09/05/19  Yes Gollan, Kathlene November, MD  sertraline (ZOLOFT) 100 MG tablet Take 1 tablet (100 mg total) by mouth daily. 12/12/18  Yes Burnard Hawthorne, FNP    Allergies: No Known Allergies  Review of Systems: Review of Systems  Constitutional: Negative for chills and fever.  Respiratory: Negative for shortness of breath.   Cardiovascular: Negative for chest pain.  Gastrointestinal: Negative for abdominal pain, nausea and vomiting.  Musculoskeletal: Positive for back pain.    Physical Exam BP 122/66   Pulse 80   Temp (!) 94.1 F (34.5 C)   Resp 12   Ht 5' (1.524 m)   Wt 135 lb 12.8 oz (61.6 kg)   SpO2 94%   BMI 26.52 kg/m  CONSTITUTIONAL: No acute distress HEENT:  Normocephalic, atraumatic, extraocular motion intact. RESPIRATORY:  Lungs are clear, and breath sounds are equal bilaterally. Normal respiratory effort without pathologic use of accessory muscles. CARDIOVASCULAR: Heart is regular without murmurs, gallops, or rubs. GI: The abdomen is soft, non-distended, currently without any tenderness to palpation.  Negative Murphy's sign.  No abdominal wall hernias palpable. NEUROLOGIC:  Motor and sensation is grossly normal.  Cranial nerves are grossly intact. PSYCH:  Alert and oriented to person, place and time. Affect is normal.  Labs/Imaging: Labs 09/25/19: Total bilirubin 0.3, AST 117, ALT 163, Alk Phos 165.  Na 139, K 4.3, Cl 103, CO2 22, BUN 14, Cr 0.77.    U/S 09/22/19: IMPRESSION: 1. Cholelithiasis with slightly thickened and edematous gallbladder wall. Concern for a degree of acute cholecystitis.  2.  Diffuse biliary duct dilatation with choledocholithiasis noted.  Assessment and Plan: This is a 74 y.o. female with previous episode of cholecystitis, more recently  with episode of choledocholithiasis, s/p ERCP.  --Discussed with the patient that given the previous episode and this episode, I would recommend proceeding with cholecystectomy.  She agrees with this, and would like to proceed with surgery next month.  Discussed with her the role for robotic cholecystectomy and discussed the risks of bleeding, infection, injury to surrounding structures, the possibility for open surgery.  Discussed with her the post-op activity restrictions.  Also, given the suspicion for Wilson disease, will also do a liver biopsy intraoperatively. --Based on her schedule, she will be scheduled for 10/26/19.  She understands that she would need COVID testing prior to surgery. --She will stop her Aspirin prior to surgery, with her last dose on 10/20/19.  We'll send clearance form to her PCP.  Face-to-face time spent with the patient and care providers was 25 minutes, with more than 50% of the time spent counseling, educating, and coordinating care of the patient.     [employer]  Wynne Dust, MD [facility]

## 2019-10-13 NOTE — Progress Notes (Signed)
10/13/2019  History of Present Illness: [name] is a 74 y.o. female with a history of cholecystitis in 08/04/17, treated with antibiotics as outpatient due to lack of OR availability.  After episode resolved, she elected not to have surgery.  She has been following GI recently for elevated LFTs.  There is suspicion that she could have Wilson Disease.  An U/S was done on 09/22/19 which showed cholelithiasis with slightly thickened and edematous gallbladder, but also with diffuse biliary duct dilatation and choledocholithiasis.  She underwent ERCP on 6/17 and two stones were removed from CBD.  She presents today for evaluation for cholecystectomy and liver biopsy.  Currently, she's not having any symptoms.  She reports that prior to the ERCP, she was having pain radiating to her back between shoulder blades, but otherwise did not have RUQ pain, nausea, or vomiting.  Denies having any fevers, chills, chest pain, shortness of breath.  Past Medical History: Past Medical History:  Diagnosis Date  . Abnormal Pap smear of cervix    Dr. Prentice Docker   . Coronary artery disease    a. 06/2009 MI/PCI to LCX and LAD; b. 01/2011 Occluded stents-->CABG x 3 ([facility]): LIMA->LAD, VG->LCX, VG->RPDA; c. 01/2012 Cath: LM 50, LAD 95ost, 150m LCX 95ost, 1060mRCA 3072mPDA small, LIMA->LAD ok, VG->LCX ok, VG->RPDA 100-->Med rx.  . Degenerative disc disease, lumbar    L4/5 noted CT ab/pelvis 05/19/11   . GERD (gastroesophageal reflux disease)   . Hyperlipidemia   . Hypertension    under control  . Macular degeneration   . Myocardial infarction ([place] . Parathyroid adenoma    right 13 x 8 mm s/p resection   . PVD [place]per[place] disease) ([place]  a. 01/2010 s/p Aortofemoral bypass.     Past Surgical History: Past Surgical History:  Procedure Laterality Date  . CARDIAC CATHETERIZATION  2013   [facility]  . CATARACT EXTRACTION    . COLPOSCOPY    . CORONARY ARTERY BYPASS GRAFT  02/07/2010   x 3  . ERCP  N/A 10/05/2019   Procedure: ENDOSCOPIC RETROGRADE CHOLANGIOPANCREATOGRAPHY (ERCP);  Surgeon: WohLucilla LameD;  [facility];  Service: Endoscopy;  Laterality: N/A;  . FACIAL COSMETIC SURGERY  2013   Dr. MadNadeen Landau FOOT SURGERY     right  . ILIO-FEMORAL BYPASS GRAFT  2011  . MITRAL VALVE REPAIR  01/2010  . ORIF ANKLE FRACTURE Right 10/08/2016   Procedure: OPEN REDUCTION INTERNAL FIXATION (ORIF) ANKLE FRACTURE;  Surgeon: PogCorky MullD;  Location: [facility] ORS;  Service: Orthopedics;  Laterality: Right;  . ORIF WRIST FRACTURE Right 12/29/2018   Procedure: OPEN REDUCTION INTERNAL FIXATION (ORIF) WRIST FRACTURE;  Surgeon: PogCorky MullD;  Location: [facility] ORS;  Service: Orthopedics;  Laterality: Right;  . PARATHYROIDECTOMY    . TUBAL LIGATION      Home Medications: Prior to Admission medications   Medication Sig Start Date End Date Taking? Authorizing Provider  aspirin 81 MG EC tablet Take 81 mg by mouth daily.    Yes [provider]  BESIVANCE 0.6 % SUSP  06/22/19  Yes [provider]  Cholecalciferol (VITAMIN D3) 1000 UNITS CAPS Take 1,000 Units by mouth daily.    Yes [provider]  doxylamine, Sleep, (SLEEP AID) 25 MG tablet Take 25 mg by mouth at bedtime as needed.   Yes [provider]  ezetimibe (ZETIA) 10 MG tablet TAKE 1 TABLET BY MOUTH ONCE DAILY 10/09/19  Yes  Antonieta Iba, MD  HYDROcodone-acetaminophen (NORCO) 7.5-325 MG tablet Take 1 tablet by mouth every 6 (six) hours as needed for moderate pain. 12/29/18  Yes Poggi, Excell Seltzer, MD  metoprolol tartrate (LOPRESSOR) 25 MG tablet Take 0.5 tablets (12.5 mg total) by mouth 2 (two) times daily. 10/09/19  Yes Gollan, Tollie Pizza, MD  Multiple Vitamins-Minerals (PRESERVISION AREDS 2 PO) Take 1 capsule by mouth 2 (two) times daily.   Yes [provider]  pantoprazole (PROTONIX) 40 MG tablet TAKE 1 TABLET BY MOUTH ONCE DAILY. Patient taking differently: Take 40 mg by mouth daily.   10/17/18  Yes Allegra Grana, FNP  Probiotic Product (RISA-BID PROBIOTIC PO) Take 250 mg by mouth daily.    Yes [provider]  rosuvastatin (CRESTOR) 40 MG tablet TAKE 1 TABLET BY MOUTH AT BEDTIME 09/05/19  Yes Gollan, Tollie Pizza, MD  sertraline (ZOLOFT) 100 MG tablet Take 1 tablet (100 mg total) by mouth daily. 12/12/18  Yes Allegra Grana, FNP    Allergies: No Known Allergies  Review of Systems: Review of Systems  Constitutional: Negative for chills and fever.  Respiratory: Negative for shortness of breath.   Cardiovascular: Negative for chest pain.  Gastrointestinal: Negative for abdominal pain, nausea and vomiting.  Musculoskeletal: Positive for back pain.    Physical Exam BP 122/66   Pulse 80   Temp (!) 94.1 F (34.5 C)   Resp 12   Ht 5' (1.524 m)   Wt 135 lb 12.8 oz (61.6 kg)   SpO2 94%   BMI 26.52 kg/m  CONSTITUTIONAL: No acute distress HEENT:  Normocephalic, atraumatic, extraocular motion intact. RESPIRATORY:  Lungs are clear, and breath sounds are equal bilaterally. Normal respiratory effort without pathologic use of accessory muscles. CARDIOVASCULAR: Heart is regular without murmurs, gallops, or rubs. GI: The abdomen is soft, non-distended, currently without any tenderness to palpation.  Negative Murphy's sign.  No abdominal wall hernias palpable. NEUROLOGIC:  Motor and sensation is grossly normal.  Cranial nerves are grossly intact. PSYCH:  Alert and oriented to person, place and time. Affect is normal.  Labs/Imaging: Labs 09/25/19: Total bilirubin 0.3, AST 117, ALT 163, Alk Phos 165.  Na 139, K 4.3, Cl 103, CO2 22, BUN 14, Cr 0.77.    U/S 09/22/19: IMPRESSION: 1. Cholelithiasis with slightly thickened and edematous gallbladder wall. Concern for a degree of acute cholecystitis.  2.  Diffuse biliary duct dilatation with choledocholithiasis noted.  Assessment and Plan: This is a 74 y.o. female with previous episode of cholecystitis, more recently  with episode of choledocholithiasis, s/p ERCP.  --Discussed with the patient that given the previous episode and this episode, I would recommend proceeding with cholecystectomy.  She agrees with this, and would like to proceed with surgery next month.  Discussed with her the role for robotic cholecystectomy and discussed the risks of bleeding, infection, injury to surrounding structures, the possibility for open surgery.  Discussed with her the post-op activity restrictions.  Also, given the suspicion for Wilson disease, will also do a liver biopsy intraoperatively. --Based on her schedule, she will be scheduled for 10/26/19.  She understands that she would need COVID testing prior to surgery. --She will stop her Aspirin prior to surgery, with her last dose on 10/20/19.  We'll send clearance form to her PCP.  Face-to-face time spent with the patient and care providers was 25 minutes, with more than 50% of the time spent counseling, educating, and coordinating care of the patient.     [employer]  Wynne Dust, MD [facility]

## 2019-10-13 NOTE — Patient Instructions (Addendum)
Our surgery scheduler will contact you to schedule your surgery. Please have the [facility] Sheet available when she calls you.   Please stop taking Aspirin 5 days prior to surgery. Last dose will be October 20, 2019.   We will send a medical clearance to your Primary Care provider to get clearance on continuing with surgery.   Call the office if you have any questions or concerns.   Laparoscopic Cholecystectomy Laparoscopic cholecystectomy is surgery to remove the gallbladder. The gallbladder is a pear-shaped organ that lies beneath the liver on the right side of the body. The gallbladder stores bile, which is a fluid that helps the body to digest fats. Cholecystectomy is often done for inflammation of the gallbladder (cholecystitis). This condition is usually caused by a buildup of gallstones (cholelithiasis) in the gallbladder. Gallstones can block the flow of bile, which can result in inflammation and pain. In severe cases, emergency surgery may be required. This procedure is done though small incisions in your abdomen (laparoscopic surgery). A thin scope with a camera (laparoscope) is inserted through one incision. Thin surgical instruments are inserted through the other incisions. In some cases, a laparoscopic procedure may be turned into a type of surgery that is done through a larger incision (open surgery). Tell a health care provider about:  Any allergies you have.  All medicines you are taking, including vitamins, herbs, eye drops, creams, and over-the-counter medicines.  Any problems you or family members have had with anesthetic medicines.  Any blood disorders you have.  Any surgeries you have had.  Any medical conditions you have.  Whether you are pregnant or may be pregnant. What are the risks? Generally, this is a safe procedure. However, problems may occur, including:  Infection.  Bleeding.  Allergic reactions to medicines.  Damage to other structures or organs.  A stone  remaining in the common bile duct. The common bile duct carries bile from the gallbladder into the small intestine.  A bile leak from the cyst duct that is clipped when your gallbladder is removed. What happens before the procedure? Staying hydrated Follow instructions from your health care provider about hydration, which may include:  Up to 2 hours before the procedure - you may continue to drink clear liquids, such as water, clear fruit juice, black coffee, and plain tea. Eating and drinking restrictions Follow instructions from your health care provider about eating and drinking, which may include:  8 hours before the procedure - stop eating heavy meals or foods such as meat, fried foods, or fatty foods.  6 hours before the procedure - stop eating light meals or foods, such as toast or cereal.  6 hours before the procedure - stop drinking milk or drinks that contain milk.  2 hours before the procedure - stop drinking clear liquids. Medicines  Ask your health care provider about: ? Changing or stopping your regular medicines. This is especially important if you are taking diabetes medicines or blood thinners. ? Taking medicines such as aspirin and ibuprofen. These medicines can thin your blood. Do not take these medicines before your procedure if your health care provider instructs you not to.  You may be given antibiotic medicine to help prevent infection. General instructions  Let your health care provider know if you develop a cold or an infection before surgery.  Plan to have someone take you home from the hospital or clinic.  Ask your health care provider how your surgical site will be marked or identified. What happens  during the procedure?   To reduce your risk of infection: ? Your health care team will wash or sanitize their hands. ? Your skin will be washed with soap. ? Hair may be removed from the surgical area.  An IV tube may be inserted into one of your  veins.  You will be given one or more of the following: ? A medicine to help you relax (sedative). ? A medicine to make you fall asleep (general anesthetic).  A breathing tube will be placed in your mouth.  Your surgeon will make several small cuts (incisions) in your abdomen.  The laparoscope will be inserted through one of the small incisions. The camera on the laparoscope will send images to a TV screen (monitor) in the operating room. This lets your surgeon see inside your abdomen.  Air-like gas will be pumped into your abdomen. This will expand your abdomen to give the surgeon more room to perform the surgery.  Other tools that are needed for the procedure will be inserted through the other incisions. The gallbladder will be removed through one of the incisions.  Your common bile duct may be examined. If stones are found in the common bile duct, they may be removed.  After your gallbladder has been removed, the incisions will be closed with stitches (sutures), staples, or skin glue.  Your incisions may be covered with a bandage (dressing). The procedure may vary among health care providers and hospitals. What happens after the procedure?  Your blood pressure, heart rate, breathing rate, and blood oxygen level will be monitored until the medicines you were given have worn off.  You will be given medicines as needed to control your pain.  Do not drive for 24 hours if you were given a sedative. This information is not intended to replace advice given to you by your health care provider. Make sure you discuss any questions you have with your health care provider. Document Revised: 03/19/2017 Document Reviewed: 09/23/2015 Elsevier Patient Education  2020 [place].  Liver Biopsy  The liver is a large organ in the upper right side of the abdomen. A liver biopsy is a procedure in which a tissue sample is taken from the liver and examined under a microscope. There are three types  of liver biopsies:  Percutaneous. A needle is used to remove a sample through an incision in your abdomen.  Laparoscopic. Several incisions are made in the abdomen. A sample is removed with the help of a tiny camera.  Transjugular. An incision is made in your neck in the area of the jugular vein. A sample is removed through a small flexible tube that is passed down the blood vessel and into your liver. Tell a health care provider about:  Any allergies you have.  All medicines you are taking, including vitamins, herbs, eye drops, creams, and over-the-counter medicines.  Any problems you or family members have had with anesthetic medicines.  Any blood disorders you have.  Any surgeries you have had.  Any medical conditions you have.  Whether you are pregnant or may be pregnant. What are the risks? Generally, this is a safe procedure. However, problems can occur and include:  Bleeding.  Infection.  Bruising.  Pain.  Injury to nearby organs or tissues, such as nerves, gallbladder, liver, or lungs. What happens before the procedure? Eating and drinking restrictions  You may be asked not to drink or eat for 6-8 hours before the liver biopsy. You may be allowed to eat  a light breakfast. Talk to your health care provider about when you should stop eating and drinking. Medicines Ask your health care provider about:  Changing or stopping your regular medicines. This is especially important if you are taking diabetes medicines or blood thinners.  Taking medicines such as aspirin and ibuprofen. These medicines can thin your blood. Do not take these medicines unless your health care provider tells you to take them.  Taking over-the-counter medicines, vitamins, herbs, and supplements. General instructions  Do not use any products that contain nicotine or tobacco, such as cigarettes and e-cigarettes. If you need help quitting, ask your health care provider.  Plan to have someone  take you home from the hospital or clinic.  Plan to have a responsible adult care for you for at least 24 hours after you leave the hospital or clinic. This is important.  You may have blood or urine tests.  Ask your health care provider what steps will be taken to prevent infection. These may include: ? Removing hair at the surgery site. ? Washing skin with a germ-killing soap. ? Taking antibiotic medicine. What happens during the procedure?  An IV will be inserted into one of your veins. ? You will be given one or more of the following: ? A medicine to help you relax (sedative). ? A medicine to numb the area (local anesthetic). ? A medicine to make you fall asleep (general anesthetic).  Your health care provider will use one of the following procedures to remove samples from your liver. These procedures may vary among health care providers and hospitals. Percutaneous liver biopsy  You will lie on your back, with your right hand over your head.  A health care provider will locate your liver by tapping and pressing on the right side of your abdomen, or by using an ultrasound or CT scan.  A local anesthetic will be used to numb an area at the bottom of your last right rib.  A small incision will be made in the numbed area.  A biopsy needle will be inserted into the incision.  Several samples of liver tissue will be taken. You will be asked to hold your breath as each sample is taken.  The incision will be closed with stitches (sutures).  A bandage (dressing) may be placed over the incision. Laparoscopic liver biopsy  You will lie on your back.  Several small incisions will be made in your abdomen.  Your health care provider will pass a tiny camera through one incision. The camera will allow the liver to be viewed on a TV monitor in the operating room.  Tools will be passed through the other incision or incisions.  Samples of the liver will be removed using the  tools.  The incisions will be closed with stitches (sutures).  A bandage (dressing) may be placed over the incisions. Transjugular liver biopsy  You will lie on your back on an X-ray table, with your head turned to your left.  An area on your neck, just over your jugular vein, will be numbed.  An incision will be made in the numbed area.  A tiny tube will be inserted through the incision. The tube will be passed into the jugular vein to a blood vessel in the liver called the hepatic vein.  A dye will be injected through the tube.  X-rays will be taken. The dye will make the blood vessels in the liver light up on the X-rays.  The biopsy needle will  be placed through the tube until it reaches the liver.  Samples of liver tissue will be taken with the biopsy needle.  The needle and the tube will be removed.  The incision will be closed with stitches (sutures).  A bandage (dressing) may be placed over the incision. What happens after the procedure?  Your blood pressure, heart rate, breathing rate, and blood oxygen level will be monitored until you leave the hospital or clinic.  You will be asked to rest quietly for 2-4 hours or longer.  You will be closely monitored for bleeding from the biopsy site.  You may be allowed to go home when the medicines have worn off and you can walk, drink, eat, and use the bathroom. Summary  A liver biopsy is a procedure in which a tissue sample is taken from the liver and examined under a microscope.  This is a safe procedure, but problems can occur, including bleeding, infection, pain, or injury to nearby organs or tissues.  Ask your health care provider about changing or stopping your regular medicines.  Plan to have someone take you home from the hospital or clinic and to be with you for 24 hours after the procedure. This information is not intended to replace advice given to you by your health care provider. Make sure you discuss any  questions you have with your health care provider. Document Revised: 04/16/2017 Document Reviewed: 04/16/2017 Elsevier Patient Education  2020 [employer].

## 2019-10-13 NOTE — Telephone Encounter (Signed)
Medical Clearance sent to [name], FNP at this time.   F: [phone] P: [phone]

## 2019-10-13 NOTE — Telephone Encounter (Signed)
Received a request for surgical clearance for the patient. Called to schedule her for an appointment.   Patient states she is busy at the moment and will call our office back on Monday to be scheduled.

## 2019-10-18 ENCOUNTER — Encounter
Admission: RE | Admit: 2019-10-18 | Discharge: 2019-10-18 | Disposition: A | Discharge status: Home / Self Care | Payer: Medicare HMO | Source: Ambulatory Visit | Attending: Surgery | Admitting: Surgery

## 2019-10-18 ENCOUNTER — Telehealth: Payer: Self-pay | Admitting: Emergency Medicine

## 2019-10-18 ENCOUNTER — Other Ambulatory Visit: Payer: Self-pay

## 2019-10-18 NOTE — Telephone Encounter (Signed)
Per PCP's note pt is to contact their office to schedule an appt to get seen prior to medical clearance.   Tried contacting pt with no answer. Left vm to call the office back. When pt calls back she needs to be made aware to contact PCP in order for [place] to get medical clearance.

## 2019-10-18 NOTE — Patient Instructions (Addendum)
Your procedure is scheduled on: 10/26/19 Report to [facility]. To find out your arrival time please call [phone] between 1PM - 3PM on 10/25/19.  Remember: Instructions that are not followed completely may result in serious medical risk, up to and including death, or upon the discretion of your surgeon and anesthesiologist your surgery may need to be rescheduled.     _X__ 1. Do not eat food after midnight the night before your procedure.                 No gum chewing or hard candies. You may drink clear liquids up to 2 hours                 before you are scheduled to arrive for your surgery- DO not drink clear                 liquids within 2 hours of the start of your surgery.                 Clear Liquids include:  water, apple juice without pulp, clear carbohydrate                 drink such as Clearfast or Gatorade, Black Coffee or Tea (Do not add                 anything to coffee or tea). Diabetics water only  __X__2.  On the morning of surgery brush your teeth with toothpaste and water, you                 may rinse your mouth with mouthwash if you wish.  Do not swallow any              toothpaste of mouthwash.     _X__ 3.  No Alcohol for 24 hours before or after surgery.   _X__ 4.  Do Not Smoke or use e-cigarettes For 24 Hours Prior to Your Surgery.                 Do not use any chewable tobacco products for at least 6 hours prior to                 surgery.  ____  5.  Bring all medications with you on the day of surgery if instructed.   __X__  6.  Notify your doctor if there is any change in your medical condition      (cold, fever, infections).     Do not wear jewelry, make-up, hairpins, clips or nail polish. Do not wear lotions, powders, or perfumes.  Do not shave 48 hours prior to surgery. Men may shave face and neck. Do not bring valuables to the hospital.    [facility] is not responsible for any belongings or  valuables.  Contacts, dentures/partials or body piercings may not be worn into surgery. Bring a case for your contacts, glasses or hearing aids, a denture cup will be supplied. Leave your suitcase in the car. After surgery it may be brought to your room. For patients admitted to the hospital, discharge time is determined by your treatment team.   Patients discharged the day of surgery will not be allowed to drive home.   Please read over the following fact sheets that you were given:   MRSA Information  __X__ Take these medicines the morning of surgery with A SIP OF WATER:  1. ezetimibe (ZETIA) 10 MG tablet  2. metoprolol tartrate (LOPRESSOR) 25 MG tablet  3. pantoprazole (PROTONIX) 40 MG tablet  4. sertraline (ZOLOFT) 100 MG tablet (if you take in the morning)  5. HYDROcodone-acetaminophen (NORCO) 7.5-325 MG tablet if needed  6.  ____ Fleet Enema (as directed)   __X__ Use CHG Soap/SAGE wipes as directed  ____ Use inhalers on the day of surgery  ____ Stop metformin/Janumet/Farxiga 2 days prior to surgery    ____ Take 1/2 of usual insulin dose the night before surgery. No insulin the morning          of surgery.   ____ Stop Blood Thinners Coumadin/Plavix/Xarelto/Pleta/Pradaxa/Eliquis/Effient/Aspirin  on   Or contact your Surgeon, Cardiologist or Medical Doctor regarding  ability to stop your blood thinners  __X__ Stop Anti-inflammatories 7 days before surgery such as Advil, Ibuprofen, Motrin,  BC or Goodies Powder, Naprosyn, Naproxen, Aleve, Aspirin    __X__ Stop all herbal supplements, fish oil or vitamin E until after surgery.    ____ Bring C-Pap to the hospital.   Stop Aspirin and Co Q10 7/2

## 2019-10-18 NOTE — Telephone Encounter (Signed)
Per PCP, Joycelyn Schmid is out of the office and will be able to see pt until July 9th. Surgery will be rescheduled.   Patient, Dr Hampton Abbot and Pamala Hurry (surgery scheduler) has been notified.

## 2019-10-19 ENCOUNTER — Telehealth: Payer: Self-pay

## 2019-10-19 ENCOUNTER — Other Ambulatory Visit
Admission: RE | Admit: 2019-10-19 | Discharge: 2019-10-19 | Disposition: A | Discharge status: Home / Self Care | Payer: Medicare HMO | Source: Ambulatory Visit | Attending: Surgery | Admitting: Surgery

## 2019-10-19 ENCOUNTER — Encounter: Payer: Self-pay | Admitting: Urgent Care

## 2019-10-19 ENCOUNTER — Telehealth: Payer: Self-pay | Admitting: Surgery

## 2019-10-19 ENCOUNTER — Encounter: Payer: Self-pay | Admitting: Surgery

## 2019-10-19 DIAGNOSIS — Z01812 Encounter for preprocedural laboratory examination: Secondary | ICD-10-CM | POA: Insufficient documentation

## 2019-10-19 LAB — COMPREHENSIVE METABOLIC PANEL
ALT: 160 U/L — ABNORMAL HIGH (ref 0–44)
AST: 136 U/L — ABNORMAL HIGH (ref 15–41)
Albumin: 3.8 g/dL (ref 3.5–5.0)
Alkaline Phosphatase: 99 U/L (ref 38–126)
Anion gap: 8 (ref 5–15)
BUN: 18 mg/dL (ref 8–23)
CO2: 24 mmol/L (ref 22–32)
Calcium: 9.4 mg/dL (ref 8.9–10.3)
Chloride: 103 mmol/L (ref 98–111)
Creatinine, Ser: 0.72 mg/dL (ref 0.44–1.00)
GFR calc Af Amer: >60 mL/min (ref >60)
GFR calc non Af Amer: >60 mL/min (ref >60)
Glucose, Bld: 97 mg/dL (ref 70–99)
Potassium: 4.6 mmol/L (ref 3.5–5.1)
Sodium: 135 mmol/L (ref 135–145)
Total Bilirubin: 0.7 mg/dL (ref 0.3–1.2)
Total Protein: 6.4 g/dL — ABNORMAL LOW (ref 6.5–8.1)

## 2019-10-19 LAB — CBC WITH DIFFERENTIAL/PLATELET
Abs Immature Granulocytes: 0.03 10*3/uL (ref 0.00–0.07)
Basophils Absolute: 0.1 10*3/uL (ref 0.0–0.1)
Basophils Relative: 1 %
Eosinophils Absolute: 0.3 10*3/uL (ref 0.0–0.5)
Eosinophils Relative: 4 %
HCT: 36.8 % (ref 36.0–46.0)
Hemoglobin: 12.6 g/dL (ref 12.0–15.0)
Immature Granulocytes: 0 %
Lymphocytes Relative: 27 %
Lymphs Abs: 2.1 10*3/uL (ref 0.7–4.0)
MCH: 31.2 pg (ref 26.0–34.0)
MCHC: 34.2 g/dL (ref 30.0–36.0)
MCV: 91.1 fL (ref 80.0–100.0)
Monocytes Absolute: 0.8 10*3/uL (ref 0.1–1.0)
Monocytes Relative: 11 %
Neutro Abs: 4.3 10*3/uL (ref 1.7–7.7)
Neutrophils Relative %: 57 %
Platelets: 243 10*3/uL (ref 150–400)
RBC: 4.04 MIL/uL (ref 3.87–5.11)
RDW: 14.6 % (ref 11.5–15.5)
WBC: 7.5 10*3/uL (ref 4.0–10.5)
nRBC: 0 % (ref 0.0–0.2)

## 2019-10-19 LAB — PROTIME-INR
INR: 1 (ref 0.8–1.2)
Prothrombin Time: 13 seconds (ref 11.4–15.2)

## 2019-10-19 NOTE — Telephone Encounter (Signed)
Updated information regarding surgery.  Surgery had to be rescheduled per Dr. Hampton Abbot as medical clearance needed.  Pt has been advised of Pre-Admission date/time, COVID Testing date and Surgery date.  Surgery Date: 10/31/19 Preadmission Testing Date: done on 10/18/19 Covid Testing Date: 10/27/19 - patient advised to go to the [facility] between 8a-1p  Patient has been made aware to call [phone], between 1-3:00pm the day before surgery, to find out what time to arrive for surgery.

## 2019-10-19 NOTE — Telephone Encounter (Signed)
-----   Message from Deberah Pelton, NP sent at 10/19/2019  7:55 AM EDT ----- Regarding: FW: Request for pre-operative cardiac clearance  ----- Message ----- From: Karen Kitchens, NP Sent: 10/18/2019   2:09 PM EDT To: [facility] Preop Subject: Request for pre-operative cardiac clearance    Request for surgical clearance:  1. What type of surgery is being performed? Robotic cholecystectomy with ICG cholangiogram and liver biopsy  2. When is this surgery scheduled? 10/26/2019  3. Type of clearance is required (medical vs. pharmacy clearance to hold med vs. both)? Both   4. Are there any medications that need to be held prior to surgery and how long? No  5. Practice name and name of physician performing surgery? -Requesting: [facility] -Performing : Piscoya, MD  6. What is the office phone number?  [phone]   7. What is the office fax number? [phone]  8. Anesthesia type (None, local, MAC, general)? General  Honor Loh, MSN, APRN, FNP-C, CEN [facility]  Peri-operative Services Nurse Practitioner Phone: [phone] 10/18/19 2:09 PM

## 2019-10-19 NOTE — Telephone Encounter (Signed)
   Primary Cardiologist: Ida Rogue, MD  Chart reviewed as part of pre-operative protocol coverage. Given past medical history and time since last visit, based on ACC/AHA guidelines, [name] would be at acceptable risk for the planned procedure without further cardiovascular testing.   I will route this recommendation to the requesting party via Epic fax function and remove from pre-op pool.  Please call with questions.  Jossie Ng. Yassin Scales NP-C    10/19/2019, [address] Office [phone] Fax [phone]

## 2019-10-24 ENCOUNTER — Other Ambulatory Visit: Payer: Medicare HMO

## 2019-10-26 ENCOUNTER — Encounter: Payer: Self-pay | Admitting: Gastroenterology

## 2019-10-26 ENCOUNTER — Telehealth (INDEPENDENT_AMBULATORY_CARE_PROVIDER_SITE_OTHER): Payer: Medicare HMO | Admitting: Gastroenterology

## 2019-10-26 DIAGNOSIS — R748 Abnormal levels of other serum enzymes: Secondary | ICD-10-CM | POA: Diagnosis not present

## 2019-10-26 NOTE — Progress Notes (Signed)
Brittany Antigua, MD [address]  [place]  [place], [place] [address]  Main: [phone]  Fax: [phone]   Primary Care Physician: Burnard Hawthorne, FNP  Virtual Visit via Telephone Note  I connected with patient on 10/26/19 at  2:30 PM EDT by telephone and verified that I am speaking with the correct person using two identifiers.   I discussed the limitations, risks, security and privacy concerns of performing an evaluation and management service by telephone and the availability of in person appointments. I also discussed with the patient that there may be a patient responsible charge related to this service. The patient expressed understanding and agreed to proceed.  Location of Patient: Home Location of Provider: Home Persons involved: Patient and provider only during the visit (nursing staff and front desk staff was involved in communicating with the patient prior to the appointment, reviewing medications and checking them in)   History of Present Illness: Chief Complaint  Patient presents with  . Elevated Hepatic Enzymes     HPI: [name] is a 74 y.o. female being seen for follow-up of elevated liver enzymes.  Since her last visit, patient states she has seen the ophthalmologist and he stated that he did not see features of Wilson's disease on his exam.  Patient states since her ERCP her abdominal pain has completely resolved.  She is scheduled for cholecystectomy with liver biopsy with Dr. Hampton Abbot next week.  Current Outpatient Medications  Medication Sig Dispense Refill  . BESIVANCE 0.6 % SUSP Place 4 drops into the left eye See admin instructions. Instill 4 drop into the left eye on the day of and the day after eye injection    . Cholecalciferol (VITAMIN D3) 1000 UNITS CAPS Take 1,000 Units by mouth daily.     . Coenzyme Q10 (CO Q10 PO) Take 1 capsule by mouth daily.    Marland Kitchen doxylamine, Sleep, (SLEEP AID) 25 MG tablet Take 25 mg by mouth at bedtime as  needed.     . ezetimibe (ZETIA) 10 MG tablet TAKE 1 TABLET BY MOUTH ONCE DAILY (Patient taking differently: Take 10 mg by mouth daily. ) 90 tablet 3  . HYDROcodone-acetaminophen (NORCO) 7.5-325 MG tablet Take 1 tablet by mouth every 6 (six) hours as needed for moderate pain. (Patient taking differently: Take 1 tablet by mouth in the morning, at noon, and at bedtime. ) 30 tablet 0  . metoprolol tartrate (LOPRESSOR) 25 MG tablet Take 0.5 tablets (12.5 mg total) by mouth 2 (two) times daily. 90 tablet 3  . Multiple Vitamins-Minerals (PRESERVISION AREDS 2 PO) Take 1 capsule by mouth 2 (two) times daily.    . pantoprazole (PROTONIX) 40 MG tablet TAKE 1 TABLET BY MOUTH ONCE DAILY. (Patient taking differently: Take 40 mg by mouth daily. ) 90 tablet 4  . Probiotic Product (RISA-BID PROBIOTIC PO) Take 250 mg by mouth daily.     . rosuvastatin (CRESTOR) 40 MG tablet TAKE 1 TABLET BY MOUTH AT BEDTIME (Patient taking differently: Take 40 mg by mouth at bedtime. ) 90 tablet 0  . sertraline (ZOLOFT) 100 MG tablet Take 1 tablet (100 mg total) by mouth daily. 90 tablet 3  . aspirin 81 MG EC tablet Take 81 mg by mouth daily.  (Patient not taking: Reported on 10/26/2019)     No current facility-administered medications for this visit.    Allergies as of 10/26/2019  . (No Known Allergies)    Review of Systems:    All  systems reviewed and negative except where noted in HPI.   Observations/Objective:  Labs: CMP     Component Value Date/Time   NA 135 10/19/2019 1134   NA 139 09/25/2019 1439   NA 139 06/28/2012 1112   K 4.6 10/19/2019 1134   K 4.0 06/28/2012 1112   CL 103 10/19/2019 1134   CL 109 (H) 06/28/2012 1112   CO2 24 10/19/2019 1134   CO2 25 06/28/2012 1112   GLUCOSE 97 10/19/2019 1134   GLUCOSE 81 06/28/2012 1112   BUN 18 10/19/2019 1134   BUN 14 09/25/2019 1439   BUN 17 06/28/2012 1112   CREATININE 0.72 10/19/2019 1134   CREATININE 0.77 06/28/2012 1112   CALCIUM 9.4 10/19/2019 1134    CALCIUM 10.3 (H) 06/28/2012 1112   PROT 6.4 (L) 10/19/2019 1134   PROT 6.4 09/25/2019 1439   PROT 7.5 06/27/2011 1208   ALBUMIN 3.8 10/19/2019 1134   ALBUMIN 4.2 09/25/2019 1439   ALBUMIN 4.1 06/27/2011 1208   AST 136 (H) 10/19/2019 1134   AST 62 (H) 06/27/2011 1208   ALT 160 (H) 10/19/2019 1134   ALT 48 06/27/2011 1208   ALKPHOS 99 10/19/2019 1134   ALKPHOS 74 06/27/2011 1208   BILITOT 0.7 10/19/2019 1134   BILITOT 0.3 09/25/2019 1439   BILITOT 0.4 06/27/2011 1208   GFRNONAA >60 10/19/2019 1134   GFRNONAA >60 06/28/2012 1112   GFRAA >60 10/19/2019 1134   GFRAA >60 06/28/2012 1112   Lab Results  Component Value Date   WBC 7.5 10/19/2019   HGB 12.6 10/19/2019   HCT 36.8 10/19/2019   MCV 91.1 10/19/2019   PLT 243 10/19/2019    Imaging Studies: DG C-Arm 1-60 Min-No Report  Result Date: 10/05/2019 Fluoroscopy was utilized by the requesting physician.  No radiographic interpretation.    Assessment and Plan:   [name] is a 74 y.o. y/o female care for follow-up of elevated liver enzymes  Assessment and Plan: Liver biopsy will help determine if patient's elevated transaminases are from [place] disease She reportedly did not have Beatriz Chancellor rings on ophthalmology exam.  We will try to reach out to the ophthalmologist to confirm this  I have also reached out to her PCP and recommended that some of her medications that could be affecting liver be changed to alternatives, including Norco, Zetia and Crestor, and Zoloft.  Specifically Norco since she is taking this 3 times a day  Above was discussed with the patient and she is agreeable with the plan  She sees her PCP tomorrow and I have asked her to inquire about the above as well  Follow Up Instructions:    I discussed the assessment and treatment plan with the patient. The patient was provided an opportunity to ask questions and all were answered. The patient agreed with the plan and demonstrated an understanding  of the instructions.   The patient was advised to call back or seek an in-person evaluation if the symptoms worsen or if the condition fails to improve as anticipated.  I provided 12 minutes of non-face-to-face time during this encounter. Additional time was spent in reviewing patient's chart, placing orders etc.   Virgel Manifold, MD  Speech recognition software was used to dictate this note.

## 2019-10-27 ENCOUNTER — Ambulatory Visit: Payer: Medicare HMO | Admitting: Family

## 2019-10-27 ENCOUNTER — Other Ambulatory Visit
Admission: RE | Admit: 2019-10-27 | Discharge: 2019-10-27 | Disposition: A | Discharge status: Home / Self Care | Payer: Medicare HMO | Source: Ambulatory Visit | Attending: Surgery | Admitting: Surgery

## 2019-10-27 ENCOUNTER — Other Ambulatory Visit: Payer: Self-pay

## 2019-10-27 ENCOUNTER — Encounter: Payer: Self-pay | Admitting: Family

## 2019-10-27 DIAGNOSIS — Z20822 Contact with and (suspected) exposure to covid-19: Secondary | ICD-10-CM | POA: Diagnosis not present

## 2019-10-27 DIAGNOSIS — R748 Abnormal levels of other serum enzymes: Secondary | ICD-10-CM

## 2019-10-27 DIAGNOSIS — Z01818 Encounter for other preprocedural examination: Secondary | ICD-10-CM | POA: Diagnosis not present

## 2019-10-27 DIAGNOSIS — Z01812 Encounter for preprocedural laboratory examination: Secondary | ICD-10-CM | POA: Insufficient documentation

## 2019-10-27 NOTE — Patient Instructions (Addendum)
Call me when you are ready for me to order bone density; last one was 2018.   Ensure that you speak with Dr Alfonso Ramus in regards to changing the [place].   We will discuss trial stopping of crestor, zetia or zoloft at follow up.   Nice to see you!

## 2019-10-27 NOTE — Progress Notes (Signed)
Subjective:    Patient ID: [name], female    DOB: [birth year], 74 y.o.   MRN: [id number]  CC: [name] is a 74 y.o. female who presents today for follow up.   HPI: Feels well today. No complaints.   Back pain resolved.  Denies exertional chest pain or pressure, numbness or tingling radiating to left arm or jaw, palpitations, dizziness, frequent headaches, changes in vision, or shortness of breath.   Here today for clearance for surgery, 10/31/19 for cholecystectomy and liver biopsy.   DDD, low back pain- Following with pain management Dr Alfonso Ramus, whom prescribes norco TID.   CAD, HLD- Compliant with zetia, crestor.  GAD- feels well on zoloft.  Due dexa- declines at this time   Consult with Dr Ronelle Nigh yesterday in regards to elevated liver enzymes Cardiology clearance 10/19/19 - acceptable risk Labs done with Dr Hampton Abbot 10/19/10, PT INR, CMP, CBC  HISTORY:  Past Medical History:  Diagnosis Date  . Abnormal Pap smear of cervix    Dr. Prentice Docker   . Coronary artery disease    a. 06/2009 MI/PCI to LCX and LAD; b. 01/2011 Occluded stents-->CABG x 3 ([facility]): LIMA->LAD, VG->LCX, VG->RPDA; c. 01/2012 Cath: LM 50, LAD 95ost, 142m, LCX 95ost, 185m, RCA 67m, RPDA small, LIMA->LAD ok, VG->LCX ok, VG->RPDA 100-->Med rx.  . Degenerative disc disease, lumbar    L4/5 noted CT ab/pelvis 05/19/11   . GERD (gastroesophageal reflux disease)   . Hyperlipidemia   . Hypertension    under control  . Macular degeneration   . Myocardial infarction ([place])   . Parathyroid adenoma    right 13 x 8 mm s/p resection   . PVD (peripheral vascular disease) ([place])    a. 01/2010 s/p Aortofemoral bypass.   Past Surgical History:  Procedure Laterality Date  . CARDIAC CATHETERIZATION  2013   [facility]  . CATARACT EXTRACTION    . COLPOSCOPY    . CORONARY ARTERY BYPASS GRAFT  02/07/2010   x 3  . ERCP N/A 10/05/2019   Procedure: ENDOSCOPIC RETROGRADE CHOLANGIOPANCREATOGRAPHY (ERCP);  Surgeon: Lucilla Lame, MD;  Location: [facility] ENDOSCOPY;  Service: Endoscopy;  Laterality: N/A;  . FACIAL COSMETIC SURGERY  2013   Dr. Nadeen Landau  . FOOT SURGERY     right  . ILIO-FEMORAL BYPASS GRAFT  2011  . MITRAL VALVE REPAIR  01/2010  . ORIF ANKLE FRACTURE Right 10/08/2016   Procedure: OPEN REDUCTION INTERNAL FIXATION (ORIF) ANKLE FRACTURE;  Surgeon: Corky Mull, MD;  Location: [facility] ORS;  Service: Orthopedics;  Laterality: Right;  . ORIF WRIST FRACTURE Right 12/29/2018   Procedure: OPEN REDUCTION INTERNAL FIXATION (ORIF) WRIST FRACTURE;  Surgeon: Corky Mull, MD;  Location: [facility] ORS;  Service: Orthopedics;  Laterality: Right;  . PARATHYROIDECTOMY    . TUBAL LIGATION     Family History  Problem Relation Age of Onset  . Heart disease Father     Allergies: Patient has no known allergies. Current Outpatient Medications on File Prior to Visit  Medication Sig Dispense Refill  . aspirin 81 MG EC tablet Take 81 mg by mouth daily.      Marland Kitchen BESIVANCE 0.6 % SUSP Place 4 drops into the left eye See admin instructions. Instill 4 drop into the left eye on the day of and the day after eye injection    . Cholecalciferol (VITAMIN D3) 1000 UNITS CAPS Take 1,000 Units by mouth daily.     . Coenzyme Q10 (CO Q10 PO) Take 1  capsule by mouth daily.    Marland Kitchen doxylamine, Sleep, (SLEEP AID) 25 MG tablet Take 25 mg by mouth at bedtime as needed.     . ezetimibe (ZETIA) 10 MG tablet TAKE 1 TABLET BY MOUTH ONCE DAILY (Patient taking differently: Take 10 mg by mouth daily. ) 90 tablet 3  . HYDROcodone-acetaminophen (NORCO) 7.5-325 MG tablet Take 1 tablet by mouth every 6 (six) hours as needed for moderate pain. (Patient taking differently: Take 1 tablet by mouth in the morning, at noon, and at bedtime. ) 30 tablet 0  . metoprolol tartrate (LOPRESSOR) 25 MG tablet Take 0.5 tablets (12.5 mg total) by mouth 2 (two) times daily. 90 tablet 3  . Multiple Vitamins-Minerals (PRESERVISION AREDS 2 PO) Take 1 capsule by mouth 2 (two) times  daily.    . pantoprazole (PROTONIX) 40 MG tablet TAKE 1 TABLET BY MOUTH ONCE DAILY. (Patient taking differently: Take 40 mg by mouth daily. ) 90 tablet 4  . Probiotic Product (RISA-BID PROBIOTIC PO) Take 250 mg by mouth daily.     . rosuvastatin (CRESTOR) 40 MG tablet TAKE 1 TABLET BY MOUTH AT BEDTIME (Patient taking differently: Take 40 mg by mouth at bedtime. ) 90 tablet 0  . sertraline (ZOLOFT) 100 MG tablet Take 1 tablet (100 mg total) by mouth daily. 90 tablet 3   No current facility-administered medications on file prior to visit.    Social History   Tobacco Use  . Smoking status: Former Smoker    Packs/day: 1.00    Years: 40.00    Pack years: 40.00    Types: Cigarettes    Quit date: 07/15/2009    Years since quitting: 10.2  . Smokeless tobacco: Never Used  . Tobacco comment: quit 06/2009 smoked from 20s to 2011 1.5 ppd no FH lung cancer   Vaping Use  . Vaping Use: Never used  Substance Use Topics  . Alcohol use: Yes    Alcohol/week: 1.0 standard drink    Types: 1 Standard drinks or equivalent per week    Comment: occ.  . Drug use: No    Review of Systems  Constitutional: Negative for chills and fever.  Respiratory: Negative for cough.   Cardiovascular: Negative for chest pain and palpitations.  Gastrointestinal: Negative for abdominal pain, nausea and vomiting.  Musculoskeletal: Negative for back pain (resolved).      Objective:    BP 126/64 (BP Location: Left Arm, Patient Position: Sitting)   Pulse 76   Temp 98 F (36.7 C)   Ht 5' (1.524 m)   Wt 135 lb (61.2 kg)   SpO2 97%   BMI 26.37 kg/m  BP Readings from Last 3 Encounters:  10/27/19 126/64  10/13/19 122/66  10/05/19 132/79   Wt Readings from Last 3 Encounters:  10/27/19 135 lb (61.2 kg)  10/18/19 135 lb (61.2 kg)  10/13/19 135 lb 12.8 oz (61.6 kg)    Physical Exam Vitals reviewed.  Constitutional:      Appearance: She is well-developed.  Eyes:     Conjunctiva/sclera: Conjunctivae normal.    Cardiovascular:     Rate and Rhythm: Normal rate and regular rhythm.     Pulses: Normal pulses.     Heart sounds: Normal heart sounds.  Pulmonary:     Effort: Pulmonary effort is normal.     Breath sounds: Normal breath sounds. No wheezing, rhonchi or rales.  Skin:    General: Skin is warm and dry.  Neurological:     Mental Status: She  is alert.  Psychiatric:        Speech: Speech normal.        Behavior: Behavior normal.        Thought Content: Thought content normal.        Assessment & Plan:   Problem List Items Addressed This Visit      Other   Elevated liver enzymes    Trending up. We discussed changing pain medication or reducing Norco. I have advised her to have this conversation with Dr Alfonso Ramus. We agreed to trial stop one by one ( crestor, then zetia, then zoloft) if changing norco doesn't result in normalized LFTs. We will follow up in 2 months.       Preoperative clearance    Well appearing today. She has received cardiology clearance for surgery. I have asked cardiology, Coletta Memos to advice on if aspirin 81mg  needs to be held and if so when and when to resume. Based on comorbities, patient is moderate in regards to complications,risk for surgery. I have provided surgical clearance. Deferred labs as patient has had labs drawn by Dr Hampton Abbot 10/19/19.           I am having Reghan Thul. Prigmore maintain her aspirin, Vitamin D3, Probiotic Product (RISA-BID PROBIOTIC PO), pantoprazole, sertraline, Multiple Vitamins-Minerals (PRESERVISION AREDS 2 PO), HYDROcodone-acetaminophen, Besivance, rosuvastatin, Sleep Aid, ezetimibe, metoprolol tartrate, and Coenzyme Q10 (CO Q10 PO).   No orders of the defined types were placed in this encounter.   Return precautions given.   Risks, benefits, and alternatives of the medications and treatment plan prescribed today were discussed, and patient expressed understanding.   Education regarding symptom management and diagnosis given to  patient on AVS.  Continue to follow with Burnard Hawthorne, FNP for routine health maintenance.   Beverly Gust Anger and I agreed with plan.   [name], FNP

## 2019-10-27 NOTE — Telephone Encounter (Signed)
azzel Call pt and advise her per below from cardiology

## 2019-10-27 NOTE — Assessment & Plan Note (Signed)
Trending up. We discussed changing pain medication or reducing Norco. I have advised her to have this conversation with Dr Alfonso Ramus. We agreed to trial stop one by one ( crestor, then zetia, then zoloft) if changing norco doesn't result in normalized LFTs. We will follow up in 2 months.

## 2019-10-27 NOTE — Telephone Encounter (Signed)
Left message to return call 

## 2019-10-27 NOTE — Assessment & Plan Note (Addendum)
Well appearing today. She has received cardiology clearance for surgery. I have asked cardiology, Coletta Memos to advice on if aspirin 81mg  needs to be held and if so when and when to resume. Based on comorbities, patient is moderate in regards to complications,risk for surgery. I have provided surgical clearance. Deferred labs as patient has had labs drawn by Dr Hampton Abbot 10/19/19.

## 2019-10-27 NOTE — Telephone Encounter (Signed)
Patient returned phone call. [name] was aware and stopped taking her asprin on 10/26/19

## 2019-10-27 NOTE — Telephone Encounter (Signed)
Hi Jesse,  I can see that you have provided surgical clearance for patient from cardiology perspective.  In setting of CAD, I am hesistant to advise on if/when to hold ASA 81mg . And When to resume?  Can you advise here?  I can have my CMA, Azzel call her to advise.

## 2019-10-28 ENCOUNTER — Encounter: Payer: Self-pay | Admitting: Surgery

## 2019-10-28 LAB — SARS CORONAVIRUS 2 (TAT 6-24 HRS): SARS Coronavirus 2: NEGATIVE

## 2019-10-30 ENCOUNTER — Telehealth: Payer: Self-pay

## 2019-10-30 NOTE — Telephone Encounter (Signed)
Cardiac Clearance received Atha Starks, FNP office. This patient is optimized for surgery at Medium Risk.

## 2019-10-31 ENCOUNTER — Encounter: Payer: Self-pay | Admitting: Surgery

## 2019-10-31 ENCOUNTER — Ambulatory Visit: Payer: Medicare HMO | Admitting: Urgent Care

## 2019-10-31 ENCOUNTER — Ambulatory Visit
Admission: RE | Admit: 2019-10-31 | Discharge: 2019-10-31 | Disposition: A | Discharge status: Home / Self Care | Payer: Medicare HMO | Attending: Surgery | Admitting: Surgery

## 2019-10-31 ENCOUNTER — Encounter
Admission: RE | Disposition: A | Discharge status: Home / Self Care | Payer: Self-pay | Source: Home / Self Care | Attending: Surgery

## 2019-10-31 DIAGNOSIS — E785 Hyperlipidemia, unspecified: Secondary | ICD-10-CM | POA: Insufficient documentation

## 2019-10-31 DIAGNOSIS — I10 Essential (primary) hypertension: Secondary | ICD-10-CM | POA: Diagnosis not present

## 2019-10-31 DIAGNOSIS — I2510 Atherosclerotic heart disease of native coronary artery without angina pectoris: Secondary | ICD-10-CM | POA: Insufficient documentation

## 2019-10-31 DIAGNOSIS — H3530 Unspecified macular degeneration: Secondary | ICD-10-CM | POA: Insufficient documentation

## 2019-10-31 DIAGNOSIS — K739 Chronic hepatitis, unspecified: Secondary | ICD-10-CM | POA: Diagnosis not present

## 2019-10-31 DIAGNOSIS — Z7982 Long term (current) use of aspirin: Secondary | ICD-10-CM | POA: Insufficient documentation

## 2019-10-31 DIAGNOSIS — Z951 Presence of aortocoronary bypass graft: Secondary | ICD-10-CM | POA: Insufficient documentation

## 2019-10-31 DIAGNOSIS — I739 Peripheral vascular disease, unspecified: Secondary | ICD-10-CM | POA: Diagnosis not present

## 2019-10-31 DIAGNOSIS — I252 Old myocardial infarction: Secondary | ICD-10-CM | POA: Insufficient documentation

## 2019-10-31 DIAGNOSIS — K8050 Calculus of bile duct without cholangitis or cholecystitis without obstruction: Secondary | ICD-10-CM

## 2019-10-31 DIAGNOSIS — Z79899 Other long term (current) drug therapy: Secondary | ICD-10-CM | POA: Insufficient documentation

## 2019-10-31 DIAGNOSIS — K219 Gastro-esophageal reflux disease without esophagitis: Secondary | ICD-10-CM | POA: Diagnosis not present

## 2019-10-31 DIAGNOSIS — Z955 Presence of coronary angioplasty implant and graft: Secondary | ICD-10-CM | POA: Insufficient documentation

## 2019-10-31 HISTORY — PX: LIVER BIOPSY: SHX301

## 2019-10-31 SURGERY — CHOLECYSTECTOMY, ROBOT-ASSISTED, LAPAROSCOPIC
Anesthesia: General

## 2019-10-31 MED ORDER — SEVOFLURANE IN SOLN
RESPIRATORY_TRACT | Status: AC
  Filled 2019-10-31: qty 250

## 2019-10-31 MED ORDER — DEXAMETHASONE SODIUM PHOSPHATE 10 MG/ML IJ SOLN
INTRAMUSCULAR | Status: AC
  Filled 2019-10-31: qty 1

## 2019-10-31 MED ORDER — DEXAMETHASONE SODIUM PHOSPHATE 10 MG/ML IJ SOLN
INTRAMUSCULAR | Status: DC | PRN
  Administered 2019-10-31: 10 mg via INTRAVENOUS

## 2019-10-31 MED ORDER — FENTANYL CITRATE (PF) 100 MCG/2ML IJ SOLN: 25.0000 ug | INTRAMUSCULAR | Status: DC | PRN

## 2019-10-31 MED ORDER — PROPOFOL 10 MG/ML IV BOLUS
INTRAVENOUS | Status: DC | PRN
  Administered 2019-10-31: 100 mg via INTRAVENOUS

## 2019-10-31 MED ORDER — ONDANSETRON HCL 4 MG/2ML IJ SOLN: 4.0000 mg | Freq: Once | INTRAMUSCULAR | Status: DC | PRN

## 2019-10-31 MED ORDER — GABAPENTIN 300 MG PO CAPS: 300.0000 mg | ORAL_CAPSULE | ORAL | Status: AC

## 2019-10-31 MED ORDER — BUPIVACAINE-EPINEPHRINE (PF) 0.25% -1:200000 IJ SOLN
INTRAMUSCULAR | Status: DC | PRN
  Administered 2019-10-31: 30 mL

## 2019-10-31 MED ORDER — PHENYLEPHRINE HCL (PRESSORS) 10 MG/ML IV SOLN
INTRAVENOUS | Status: AC
  Filled 2019-10-31: qty 1

## 2019-10-31 MED ORDER — HYDROCODONE-ACETAMINOPHEN 7.5-325 MG/15ML PO SOLN: 10.0000 mL | Freq: Four times a day (QID) | ORAL | Status: DC | PRN

## 2019-10-31 MED ORDER — ORAL CARE MOUTH RINSE: 15.0000 mL | Freq: Once | OROMUCOSAL | Status: AC

## 2019-10-31 MED ORDER — CHLORHEXIDINE GLUCONATE CLOTH 2 % EX PADS: 6.0000 | MEDICATED_PAD | Freq: Once | CUTANEOUS | Status: DC

## 2019-10-31 MED ORDER — CEFAZOLIN SODIUM-DEXTROSE 2-4 GM/100ML-% IV SOLN
2.0000 g | INTRAVENOUS | Status: AC
  Administered 2019-10-31: 2 g via INTRAVENOUS

## 2019-10-31 MED ORDER — LIDOCAINE HCL (CARDIAC) PF 100 MG/5ML IV SOSY
PREFILLED_SYRINGE | INTRAVENOUS | Status: DC | PRN
  Administered 2019-10-31: 50 mg via INTRAVENOUS

## 2019-10-31 MED ORDER — FENTANYL CITRATE (PF) 100 MCG/2ML IJ SOLN
INTRAMUSCULAR | Status: AC
  Filled 2019-10-31: qty 2

## 2019-10-31 MED ORDER — ROCURONIUM BROMIDE 10 MG/ML (PF) SYRINGE
PREFILLED_SYRINGE | INTRAVENOUS | Status: AC
  Filled 2019-10-31: qty 10

## 2019-10-31 MED ORDER — GABAPENTIN 300 MG PO CAPS
ORAL_CAPSULE | ORAL | Status: AC
  Administered 2019-10-31: 300 mg via ORAL
  Filled 2019-10-31: qty 1

## 2019-10-31 MED ORDER — ACETAMINOPHEN 10 MG/ML IV SOLN
INTRAVENOUS | Status: AC
  Filled 2019-10-31: qty 100

## 2019-10-31 MED ORDER — SUGAMMADEX SODIUM 200 MG/2ML IV SOLN
INTRAVENOUS | Status: DC | PRN
  Administered 2019-10-31: 100 mg via INTRAVENOUS

## 2019-10-31 MED ORDER — CHLORHEXIDINE GLUCONATE 0.12 % MT SOLN: 15.0000 mL | Freq: Once | OROMUCOSAL | Status: AC

## 2019-10-31 MED ORDER — BUPIVACAINE-EPINEPHRINE (PF) 0.25% -1:200000 IJ SOLN
INTRAMUSCULAR | Status: AC
  Filled 2019-10-31: qty 30

## 2019-10-31 MED ORDER — CHLORHEXIDINE GLUCONATE 0.12 % MT SOLN
OROMUCOSAL | Status: AC
  Administered 2019-10-31: 15 mL via OROMUCOSAL
  Filled 2019-10-31: qty 15

## 2019-10-31 MED ORDER — FENTANYL CITRATE (PF) 100 MCG/2ML IJ SOLN
INTRAMUSCULAR | Status: DC | PRN
  Administered 2019-10-31: 100 ug via INTRAVENOUS

## 2019-10-31 MED ORDER — ONDANSETRON HCL 4 MG/2ML IJ SOLN
INTRAMUSCULAR | Status: AC
  Filled 2019-10-31: qty 2

## 2019-10-31 MED ORDER — INDOCYANINE GREEN 25 MG IV SOLR
2.5000 mg | INTRAVENOUS | Status: DC
  Filled 2019-10-31: qty 10

## 2019-10-31 MED ORDER — HYDROCODONE-ACETAMINOPHEN 7.5-325 MG PO TABS
ORAL_TABLET | ORAL | Status: AC
  Administered 2019-10-31: 1
  Filled 2019-10-31: qty 1

## 2019-10-31 MED ORDER — CEFAZOLIN SODIUM-DEXTROSE 2-4 GM/100ML-% IV SOLN
INTRAVENOUS | Status: AC
  Filled 2019-10-31: qty 100

## 2019-10-31 MED ORDER — IBUPROFEN 600 MG PO TABS
600.0000 mg | ORAL_TABLET | Freq: Three times a day (TID) | ORAL | 1 refills | Status: DC | PRN
Start: 2019-10-31 — End: 2019-12-20

## 2019-10-31 MED ORDER — EPHEDRINE SULFATE 50 MG/ML IJ SOLN
INTRAMUSCULAR | Status: DC | PRN
  Administered 2019-10-31: 25 mg via INTRAVENOUS

## 2019-10-31 MED ORDER — HYDROCODONE-ACETAMINOPHEN 7.5-325 MG PO TABS: 1.0000 | ORAL_TABLET | Freq: Four times a day (QID) | ORAL | 0 refills | Status: DC | PRN

## 2019-10-31 MED ORDER — PROPOFOL 10 MG/ML IV BOLUS
INTRAVENOUS | Status: AC
  Filled 2019-10-31: qty 20

## 2019-10-31 MED ORDER — LACTATED RINGERS IV SOLN: INTRAVENOUS | Status: DC

## 2019-10-31 MED ORDER — ONDANSETRON HCL 4 MG/2ML IJ SOLN
INTRAMUSCULAR | Status: DC | PRN
  Administered 2019-10-31: 4 mg via INTRAVENOUS

## 2019-10-31 MED ORDER — LIDOCAINE HCL (PF) 2 % IJ SOLN
INTRAMUSCULAR | Status: AC
  Filled 2019-10-31: qty 5

## 2019-10-31 MED ORDER — ACETAMINOPHEN 500 MG PO TABS
ORAL_TABLET | ORAL | Status: AC
  Administered 2019-10-31: 1000 mg via ORAL
  Filled 2019-10-31: qty 2

## 2019-10-31 MED ORDER — PHENYLEPHRINE HCL (PRESSORS) 10 MG/ML IV SOLN
INTRAVENOUS | Status: DC | PRN
  Administered 2019-10-31 (×3): 200 ug via INTRAVENOUS

## 2019-10-31 MED ORDER — ROCURONIUM BROMIDE 100 MG/10ML IV SOLN
INTRAVENOUS | Status: DC | PRN
  Administered 2019-10-31: 50 mg via INTRAVENOUS

## 2019-10-31 MED ORDER — ACETAMINOPHEN 500 MG PO TABS: 1000.0000 mg | ORAL_TABLET | ORAL | Status: AC

## 2019-10-31 SURGICAL SUPPLY — 58 items
"PENCIL ELECTRO HAND CTR " (MISCELLANEOUS) ×1 IMPLANT
BAG INFUSER PRESSURE 100CC (MISCELLANEOUS) ×1 IMPLANT
CANISTER SUCT 1200ML W/VALVE (MISCELLANEOUS) ×2 IMPLANT
CANNULA REDUC XI 12-8 STAPL (CANNULA) ×1
CANNULA REDUCER 12-8 DVNC XI (CANNULA) IMPLANT
CHLORAPREP W/TINT 26 (MISCELLANEOUS) ×2 IMPLANT
CLIP VESOLOCK MED LG 6/CT (CLIP) ×2 IMPLANT
COVER WAND RF STERILE (DRAPES) ×2 IMPLANT
CUP MEDICINE 2OZ PLAST GRAD ST (MISCELLANEOUS) ×2 IMPLANT
DECANTER SPIKE VIAL GLASS SM (MISCELLANEOUS) ×2 IMPLANT
DEFOGGER SCOPE WARMER CLEARIFY (MISCELLANEOUS) ×2 IMPLANT
DERMABOND ADVANCED (GAUZE/BANDAGES/DRESSINGS) ×1
DERMABOND ADVANCED .7 DNX12 (GAUZE/BANDAGES/DRESSINGS) ×1 IMPLANT
DRAPE ARM DVNC X/XI (DISPOSABLE) ×4 IMPLANT
DRAPE COLUMN DVNC XI (DISPOSABLE) ×1 IMPLANT
DRAPE DA VINCI XI ARM (DISPOSABLE) ×4
DRAPE DA VINCI XI COLUMN (DISPOSABLE) ×1
DRSG TELFA 4X3 1S NADH ST (GAUZE/BANDAGES/DRESSINGS) ×1 IMPLANT
ELECT CAUTERY BLADE 6.4 (BLADE) ×2 IMPLANT
ELECT REM PT RETURN 9FT ADLT (ELECTROSURGICAL) ×2
ELECTRODE REM PT RTRN 9FT ADLT (ELECTROSURGICAL) ×1 IMPLANT
GLOVE SURG SYN 7.0 (GLOVE) ×4 IMPLANT
GLOVE SURG SYN 7.0 PF PI (GLOVE) ×2 IMPLANT
GLOVE SURG SYN 7.5  E (GLOVE) ×2
GLOVE SURG SYN 7.5 E (GLOVE) ×2 IMPLANT
GLOVE SURG SYN 7.5 PF PI (GLOVE) ×2 IMPLANT
GOWN STRL REUS W/ TWL LRG LVL3 (GOWN DISPOSABLE) ×4 IMPLANT
GOWN STRL REUS W/TWL LRG LVL3 (GOWN DISPOSABLE) ×4
IRRIGATOR SUCT 8 DISP DVNC XI (IRRIGATION / IRRIGATOR) IMPLANT
IRRIGATOR SUCTION 8MM XI DISP (IRRIGATION / IRRIGATOR) ×1
IV NS 1000ML (IV SOLUTION) ×1
IV NS 1000ML BAXH (IV SOLUTION) IMPLANT
KIT PINK PAD W/HEAD ARE REST (MISCELLANEOUS) ×2
KIT PINK PAD W/HEAD ARM REST (MISCELLANEOUS) ×1 IMPLANT
LABEL OR SOLS (LABEL) ×2 IMPLANT
NDL BIOPSY 14X6 SOFT TISS (NEEDLE) IMPLANT
NEEDLE BIOPSY 14X6 SOFT TISS (NEEDLE) ×2 IMPLANT
NEEDLE HYPO 22GX1.5 SAFETY (NEEDLE) ×2 IMPLANT
NS IRRIG 500ML POUR BTL (IV SOLUTION) ×2 IMPLANT
OBTURATOR OPTICAL STANDARD 8MM (TROCAR) ×1
OBTURATOR OPTICAL STND 8 DVNC (TROCAR) ×1
OBTURATOR OPTICALSTD 8 DVNC (TROCAR) ×1 IMPLANT
PACK LAP CHOLECYSTECTOMY (MISCELLANEOUS) ×2 IMPLANT
PENCIL ELECTRO HAND CTR (MISCELLANEOUS) ×2 IMPLANT
POUCH SPECIMEN RETRIEVAL 10MM (ENDOMECHANICALS) ×2 IMPLANT
SEAL CANN UNIV 5-8 DVNC XI (MISCELLANEOUS) ×4 IMPLANT
SEAL XI 5MM-8MM UNIVERSAL (MISCELLANEOUS) ×4
SET TUBE SMOKE EVAC HIGH FLOW (TUBING) ×2 IMPLANT
SOLUTION ELECTROLUBE (MISCELLANEOUS) ×2 IMPLANT
SPONGE LAP 18X18 RF (DISPOSABLE) ×1 IMPLANT
SPONGE LAP 4X18 RFD (DISPOSABLE) IMPLANT
STAPLER CANNULA SEAL DVNC XI (STAPLE) IMPLANT
STAPLER CANNULA SEAL XI (STAPLE) ×1
SUT MNCRL AB 4-0 PS2 18 (SUTURE) ×3 IMPLANT
SUT VIC AB 3-0 SH 27 (SUTURE) ×1
SUT VIC AB 3-0 SH 27X BRD (SUTURE) IMPLANT
SUT VICRYL 0 AB UR-6 (SUTURE) ×4 IMPLANT
TROCAR 130MM GELPORT  DAV (MISCELLANEOUS) ×2 IMPLANT

## 2019-10-31 NOTE — Interval H&P Note (Signed)
History and Physical Interval Note:  10/31/2019 7:44 AM  [name]  has presented today for surgery, with the diagnosis of Choledocholithiasis.  The various methods of treatment have been discussed with the patient and family. After consideration of risks, benefits and other options for treatment, the patient has consented to  Procedure(s): XI ROBOTIC ASSISTED LAPAROSCOPIC CHOLECYSTECTOMY (N/A) INDOCYANINE GREEN FLUORESCENCE IMAGING (ICG) (N/A) LIVER BIOPSY (N/A) as a surgical intervention.  The patient's history has been reviewed, patient examined, no change in status, stable for surgery.  I have reviewed the patient's chart and labs.  Questions were answered to the patient's satisfaction.     [name]

## 2019-10-31 NOTE — Op Note (Signed)
Procedure Date:  10/31/2019  Pre-operative Diagnosis:  Choledocholithiasis and elevated LFTs with suspicion for Wilson's disease.  Post-operative Diagnosis:  Same  Procedure:  Robotic cholecystectomy with ICG cholangiogram and percutaneous liver biopsy.  Surgeon:  Melvyn Neth, MD  Assistant:  Joseph Pierini, PA-S  Anesthesia:  General endotracheal  Estimated Blood Loss:  20 ml  Specimens:  Gallbladder, liver biopsy x 2 samples  Complications:  None  Indications for Procedure:  This is a 74 y.o. female who presents with abdominal pain and workup revealing choledocholithiasis and also findings suspicious for Wilson's disease.  The benefits, complications, treatment options, and expected outcomes were discussed with the patient. The risks of bleeding, infection, recurrence of symptoms, failure to resolve symptoms, bile duct damage, bile duct leak, retained common bile duct stone, bowel injury, and need for further procedures were all discussed with the patient and she was willing to proceed.  Description of Procedure: The patient was correctly identified in the preoperative area and brought into the operating room.  The patient was placed supine with VTE prophylaxis in place.  Appropriate time-outs were performed.  Anesthesia was induced and the patient was intubated.  Appropriate antibiotics were infused.  The abdomen was prepped and draped in a sterile fashion. An infraumbilical incision was made. A cutdown technique was used to enter the abdominal cavity without injury, and a 12 mm robotic trocar was inserted.  Pneumoperitoneum was obtained with appropriate opening pressures.  Three 8-mm robotic port were placed under direct visualization.  Using the DaVinci camera, a small stab wound was made in the RUQ just inferior to the costal margin.  A percutaneous TruCut biopsy needle was passed and inserted into the right inferior portion of the liver.  Two passes were done each with appropriate  sampling of the liver parenchyma.  These were sent to pathology.  Following that, the DaVinci platform was docked, camera was targeted and instruments were placed under direct visualization.  The gallbladder was identified.  The fundus was grasped and retracted cephalad.  Adhesions were lysed bluntly and with electrocautery. The infundibulum was grasped and retracted laterally, exposing the peritoneum overlying the gallbladder.  This was incised with electrocautery and extended on either side of the gallbladder.  There was significant adhesions but there were no injuries trying to take them down.  FireFly was used for a cholangiogram which was able to identify the cystic duct, the common bile duct.  The cystic duct and cystic artery were clearly identified and bluntly dissected.  Both were clipped twice proximally and once distally, cutting in between.  The gallbladder was taken from the gallbladder fossa in a retrograde fashion with electrocautery. In doing this, the posterior wall of the gallbladder was very adhered to the liver bed and there was some bile spillage. The gallbladder was placed in an Endocatch bag. The liver bed was inspected and any bleeding was controlled with electrocautery. The right upper quadrant was then inspected again revealing intact clips, no bleeding, and no ductal injury.  The area was thoroughly irrigated until clear fluid return.  The 71mm ports were removed under direct visualization and the Hasson trocar was removed.  The Endocatch bag was brought out via the umbilical incision. The fascial opening was closed using 0 vicryl suture.  Local anesthetic was infused in all incisions and the incisions were closed with 3-0 Vicryl for the umbilical incision and with 4-0 Monocryl for all incisions.  The wounds were cleaned and sealed with DermaBond.  The patient was  emerged from anesthesia and extubated and brought to the recovery room for further management.  The patient tolerated  the procedure well and all counts were correct at the end of the case.   Melvyn Neth, MD

## 2019-10-31 NOTE — Anesthesia Postprocedure Evaluation (Signed)
Anesthesia Post Note  Patient: [name]  Procedure(s) Performed: XI ROBOTIC ASSISTED LAPAROSCOPIC CHOLECYSTECTOMY (N/A ) INDOCYANINE GREEN FLUORESCENCE IMAGING (ICG) (N/A ) LIVER BIOPSY (N/A )  Patient location during evaluation: PACU Anesthesia Type: General Level of consciousness: awake and alert Pain management: pain level controlled Vital Signs Assessment: post-procedure vital signs reviewed and stable Respiratory status: spontaneous breathing, nonlabored ventilation, respiratory function stable and patient connected to nasal cannula oxygen Cardiovascular status: blood pressure returned to baseline and stable Postop Assessment: no apparent nausea or vomiting Anesthetic complications: no   No complications documented.   Last Vitals:  Vitals:   10/31/19 1316 10/31/19 1345  BP: (!) 108/46 (!) 107/45  Pulse: 78 76  Resp: 16 16  Temp: 36.6 C 36.9 C  SpO2: 97% 94%    Last Pain:  Vitals:   10/31/19 1345  TempSrc: Temporal  PainSc: 0-No pain                 Martha Clan

## 2019-10-31 NOTE — Discharge Instructions (Signed)

## 2019-10-31 NOTE — Anesthesia Procedure Notes (Signed)
Procedure Name: Intubation Performed by: Gaynelle Cage, CRNA Pre-anesthesia Checklist: Patient identified, Emergency Drugs available, Suction available and Patient being monitored Patient Re-evaluated:Patient Re-evaluated prior to induction Oxygen Delivery Method: Circle system utilized Preoxygenation: Pre-oxygenation with 100% oxygen Induction Type: IV induction Ventilation: Mask ventilation without difficulty and Oral airway inserted - appropriate to patient size Laryngoscope Size: 3 and Mac Grade View: Grade III Tube type: Oral Tube size: 7.0 mm Number of attempts: 1 Airway Equipment and Method: Oral airway Placement Confirmation: positive ETCO2 and breath sounds checked- equal and bilateral Secured at: 21 cm Tube secured with: Tape Dental Injury: Teeth and Oropharynx as per pre-operative assessment  Comments: Would recommend video airway device in future as only epiglottis seen

## 2019-10-31 NOTE — Anesthesia Preprocedure Evaluation (Signed)
Anesthesia Evaluation  Patient identified by MRN, date of birth, ID band Patient awake    Reviewed: Allergy & Precautions, NPO status , Patient's Chart, lab work & pertinent test results  History of Anesthesia Complications Negative for: history of anesthetic complications  Airway Mallampati: II       Dental  (+) Chipped, Missing, Dental Advidsory Given   Pulmonary neg pulmonary ROS, neg sleep apnea, neg COPD, Not current smoker, former smoker,    Pulmonary exam normal        Cardiovascular Exercise Tolerance: Good hypertension, Pt. on medications (-) angina+ CAD, + Past MI, + Cardiac Stents, + CABG and + Peripheral Vascular Disease  Normal cardiovascular exam(-) dysrhythmias + Valvular Problems/Murmurs (MV repair)      Neuro/Psych neg Seizures PSYCHIATRIC DISORDERS Anxiety negative neurological ROS     GI/Hepatic Neg liver ROS, GERD  Medicated and Controlled,  Endo/Other  neg diabetes  Renal/GU negative Renal ROS     Musculoskeletal   Abdominal   Peds  Hematology   Anesthesia Other Findings Past Medical History: No date: Abnormal Pap smear of cervix     Comment:  Dr. Prentice Docker  No date: Coronary artery disease     Comment:  a. 06/2009 MI/PCI to LCX and LAD; b. 01/2011 Occluded               stents-->CABG x 3 ([facility]): LIMA->LAD, VG->LCX, VG->RPDA;               c. 01/2012 Cath: LM 50, LAD 95ost, 136m, LCX 95ost, 185m,              RCA 78m, RPDA small, LIMA->LAD ok, VG->LCX ok, VG->RPDA               100-->Med rx. No date: Degenerative disc disease, lumbar     Comment:  L4/5 noted CT ab/pelvis 05/19/11  No date: GERD (gastroesophageal reflux disease) No date: Hyperlipidemia No date: Hypertension     Comment:  under control No date: Macular degeneration No date: Myocardial infarction ([place]) No date: Parathyroid adenoma     Comment:  right 13 x 8 mm s/p resection  No date: PVD (peripheral vascular disease)  ([place])     Comment:  a. 01/2010 s/p Aortofemoral bypass.   Reproductive/Obstetrics                             Anesthesia Physical  Anesthesia Plan  ASA: III  Anesthesia Plan: General   Post-op Pain Management:    Induction: Intravenous  PONV Risk Score and Plan: 3 and Ondansetron, Dexamethasone and Treatment may vary due to age or medical condition  Airway Management Planned: Oral ETT  Additional Equipment:   Intra-op Plan:   Post-operative Plan: Extubation in OR  Informed Consent: I have reviewed the patients History and Physical, chart, labs and discussed the procedure including the risks, benefits and alternatives for the proposed anesthesia with the patient or authorized representative who has indicated his/her understanding and acceptance.       Plan Discussed with:   Anesthesia Plan Comments:         Anesthesia Quick Evaluation

## 2019-10-31 NOTE — Transfer of Care (Signed)
Immediate Anesthesia Transfer of Care Note  Patient: [name]  Procedure(s) Performed: XI ROBOTIC ASSISTED LAPAROSCOPIC CHOLECYSTECTOMY (N/A ) INDOCYANINE GREEN FLUORESCENCE IMAGING (ICG) (N/A ) LIVER BIOPSY (N/A )  Patient Location: PACU  Anesthesia Type:General  Level of Consciousness: drowsy and patient cooperative  Airway & Oxygen Therapy: Patient Spontanous Breathing and Patient connected to face mask oxygen  Post-op Assessment: Report given to RN and Post -op Vital signs reviewed and stable  Post vital signs: Reviewed and stable  Last Vitals:  Vitals Value Taken Time  BP 99/57 10/31/19 1107  Temp    Pulse 80 10/31/19 1109  Resp 18 10/31/19 1109  SpO2 99 % 10/31/19 1109  Vitals shown include unvalidated device data.  Last Pain:  Vitals:   10/31/19 0650  TempSrc: Tympanic  PainSc: 0-No pain         Complications: No complications documented.

## 2019-11-01 ENCOUNTER — Telehealth: Payer: Self-pay | Admitting: Surgery

## 2019-11-01 ENCOUNTER — Encounter: Payer: Self-pay | Admitting: Surgery

## 2019-11-01 MED ORDER — CYCLOBENZAPRINE HCL 5 MG PO TABS: 5.0000 mg | ORAL_TABLET | Freq: Three times a day (TID) | ORAL | 0 refills | Status: DC | PRN

## 2019-11-01 NOTE — Telephone Encounter (Signed)
Patient is calling and complaining of having some pain at a level 8, but also said she keeps having muscle spasms . Please call patient and advise.

## 2019-11-01 NOTE — Telephone Encounter (Signed)
Patient complaining of pain , muscle spasm. Currently taking hydrocodone and ibuprofen. Spoke with Dr.Piscoya and flexeril can be sent.   Patient aware prescription has been sent to pharmacy.

## 2019-11-03 ENCOUNTER — Encounter: Payer: Self-pay | Admitting: Surgery

## 2019-11-13 LAB — SURGICAL PATHOLOGY

## 2019-11-15 ENCOUNTER — Encounter: Payer: Medicare HMO | Admitting: Surgery

## 2019-11-17 ENCOUNTER — Encounter: Payer: Self-pay | Admitting: Surgery

## 2019-11-17 ENCOUNTER — Other Ambulatory Visit: Payer: Self-pay

## 2019-11-17 ENCOUNTER — Ambulatory Visit (INDEPENDENT_AMBULATORY_CARE_PROVIDER_SITE_OTHER): Payer: Self-pay | Admitting: Surgery

## 2019-11-17 VITALS — BP 124/96 | HR 80 | Temp 98.1°F | Resp 12 | Ht 60.0 in | Wt 134.0 lb

## 2019-11-17 DIAGNOSIS — Z09 Encounter for follow-up examination after completed treatment for conditions other than malignant neoplasm: Secondary | ICD-10-CM

## 2019-11-17 DIAGNOSIS — K8050 Calculus of bile duct without cholangitis or cholecystitis without obstruction: Secondary | ICD-10-CM

## 2019-11-17 NOTE — Progress Notes (Signed)
11/17/2019  HPI: [name] is a 74 y.o. female s/p robotic assisted cholecystectomy with ICG cholangiogram and liver biopsy on 7/13.  She presents for follow up.  Her final pathology did not reveal any specific pathology within the liver, and excluded Wilson's disease.  From the surgical standpoint, she has been doing very well, without any abdominal pain now and tolerating diet well.  Vital signs: BP (!) 124/96   Pulse 80   Temp 98.1 F (36.7 C) (Oral)   Resp 12   Ht 5' (1.524 m)   Wt 134 lb (60.8 kg)   SpO2 93%   BMI 26.17 kg/m    Physical Exam: Constitutional: No acute distress Abdomen:  Soft, non-distended, non-tender.  Incisions healing well, without evidence of infection.  Assessment/Plan: This is a 73 y.o. female s/p robotic assisted cholecystectomy and liver biopsy.  --Patient is healing well.  Pathology did not reveal any specific liver intrinsic issue.  [name]   Melvyn Neth, [facility]

## 2019-11-17 NOTE — Patient Instructions (Addendum)
Follow up as needed.   Call Dr Michele Mcalpine office for follow up and to see what your next steps may be.   GENERAL POST-OPERATIVE PATIENT INSTRUCTIONS   WOUND CARE INSTRUCTIONS:  Keep a dry clean dressing on the wound if there is drainage. The initial bandage may be removed after 24 hours.  Once the wound has quit draining you may leave it open to air.  If clothing rubs against the wound or causes irritation and the wound is not draining you may cover it with a dry dressing during the daytime.  Try to keep the wound dry and avoid ointments on the wound unless directed to do so.  If the wound becomes bright red and painful or starts to drain infected material that is not clear, please contact your physician immediately.  If the wound is mildly pink and has a thick firm ridge underneath it, this is normal, and is referred to as a healing ridge.  This will resolve over the next 4-6 weeks.  BATHING: You may shower if you have been informed of this by your surgeon. However, Please do not submerge in a tub, hot tub, or pool until incisions are completely sealed or have been told by your surgeon that you may do so.  DIET:  You may eat any foods that you can tolerate.  It is a good idea to eat a high fiber diet and take in plenty of fluids to prevent constipation.  If you do become constipated you may want to take a mild laxative or take ducolax tablets on a daily basis until your bowel habits are regular.  Constipation can be very uncomfortable, along with straining, after recent surgery.  ACTIVITY:  You are encouraged to cough and deep breath or use your incentive spirometer if you were given one, every 15-30 minutes when awake.  This will help prevent respiratory complications and low grade fevers post-operatively if you had a general anesthetic.  You may want to hug a pillow when coughing and sneezing to add additional support to the surgical area, if you had abdominal or chest surgery, which will decrease  pain during these times.  You are encouraged to walk and engage in light activity for the next two weeks.  You should not lift more than 20 pounds for 4-6 weeks after surgery as it could put you at increased risk for complications.  Twenty pounds is roughly equivalent to a plastic bag of groceries. At that time- Listen to your body when lifting, if you have pain when lifting, stop and then try again in a few days. Soreness after doing exercises or activities of daily living is normal as you get back in to your normal routine.  MEDICATIONS:  Try to take narcotic medications and anti-inflammatory medications, such as tylenol, ibuprofen, naprosyn, etc., with food.  This will minimize stomach upset from the medication.  Should you develop nausea and vomiting from the pain medication, or develop a rash, please discontinue the medication and contact your physician.  You should not drive, make important decisions, or operate machinery when taking narcotic pain medication.  SUNBLOCK Use sun block to incision area over the next year if this area will be exposed to sun. This helps decrease scarring and will allow you avoid a permanent darkened area over your incision.  QUESTIONS:  Please feel free to call our office if you have any questions, and we will be glad to assist you.

## 2019-11-24 ENCOUNTER — Telehealth: Payer: Self-pay

## 2019-11-24 DIAGNOSIS — R748 Abnormal levels of other serum enzymes: Secondary | ICD-10-CM

## 2019-11-24 NOTE — Telephone Encounter (Signed)
-----   Message from Virgel Manifold, MD sent at 11/23/2019  3:09 PM EDT ----- Herb Grays please let the patient know, her liver biopsies do not show evidence of Wilson's disease.  I recommend repeat CMP in 2 weeks.  Please make phone follow-up with me 1 to 2 days after lab draw

## 2019-11-24 NOTE — Telephone Encounter (Signed)
Called patient and left her a detailed message letting her know that she did not have any evidence of Wilson's disease and that Dr. Bonna Gains wanted to repeat her CMP in 2 weeks and then a follow up with Dr. Bonna Gains 1-2 days after lab draw. Now I wait for patient to call me back.

## 2019-12-02 ENCOUNTER — Other Ambulatory Visit: Payer: Self-pay | Admitting: Surgery

## 2019-12-03 ENCOUNTER — Other Ambulatory Visit: Payer: Self-pay | Admitting: Cardiovascular Disease

## 2019-12-07 ENCOUNTER — Encounter (INDEPENDENT_AMBULATORY_CARE_PROVIDER_SITE_OTHER): Payer: Medicare HMO | Admitting: Ophthalmology

## 2019-12-07 ENCOUNTER — Other Ambulatory Visit: Payer: Self-pay

## 2019-12-07 DIAGNOSIS — H35033 Hypertensive retinopathy, bilateral: Secondary | ICD-10-CM

## 2019-12-07 DIAGNOSIS — I10 Essential (primary) hypertension: Secondary | ICD-10-CM

## 2019-12-07 DIAGNOSIS — H353221 Exudative age-related macular degeneration, left eye, with active choroidal neovascularization: Secondary | ICD-10-CM | POA: Diagnosis not present

## 2019-12-07 DIAGNOSIS — H353112 Nonexudative age-related macular degeneration, right eye, intermediate dry stage: Secondary | ICD-10-CM | POA: Diagnosis not present

## 2019-12-07 DIAGNOSIS — H43813 Vitreous degeneration, bilateral: Secondary | ICD-10-CM

## 2019-12-07 DIAGNOSIS — H35372 Puckering of macula, left eye: Secondary | ICD-10-CM

## 2019-12-13 NOTE — Telephone Encounter (Signed)
Called patient again but had to leave her a voicemail. However, I also sent her a MyChart message  Letting her know that Dr. Bonna Gains would like to see her but before that, she needed a CMP 2 days prior. Therefore, she is to call and schedule an appointment with Dr. Bonna Gains.

## 2019-12-14 ENCOUNTER — Other Ambulatory Visit: Payer: Self-pay | Admitting: Orthopedic Surgery

## 2019-12-14 ENCOUNTER — Ambulatory Visit
Admission: RE | Admit: 2019-12-14 | Discharge: 2019-12-14 | Disposition: A | Discharge status: Home / Self Care | Payer: Medicare HMO | Source: Ambulatory Visit | Attending: Orthopedic Surgery | Admitting: Orthopedic Surgery

## 2019-12-14 ENCOUNTER — Other Ambulatory Visit: Payer: Self-pay

## 2019-12-14 DIAGNOSIS — S42441A Displaced fracture (avulsion) of medial epicondyle of right humerus, initial encounter for closed fracture: Secondary | ICD-10-CM | POA: Diagnosis not present

## 2019-12-14 IMAGING — CT CT ELBOW*R* W/O CM
1 series · 12 of 14 positions shown, 15 images · non-contrast
Comparison: By report from [DATE]

CLINICAL DATA: Known distal humeral fracture

EXAM:
CT OF THE LOWER RIGHT EXTREMITY WITHOUT CONTRAST
TECHNIQUE: Multidetector CT imaging of the right lower extremity was performed
according to the standard protocol.

[Series 6: axial st · axial · 0.26mm/px · z∈[+74,+188]mm · 12 of 143 slices shown, 15 images]
[im 11/143  soft-tissue]
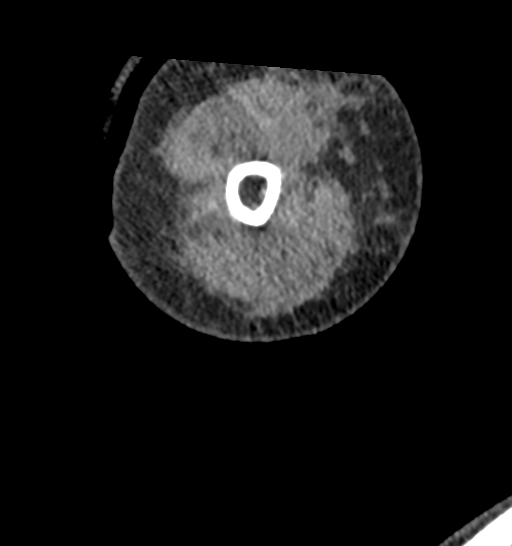
[im 11/143  bone]
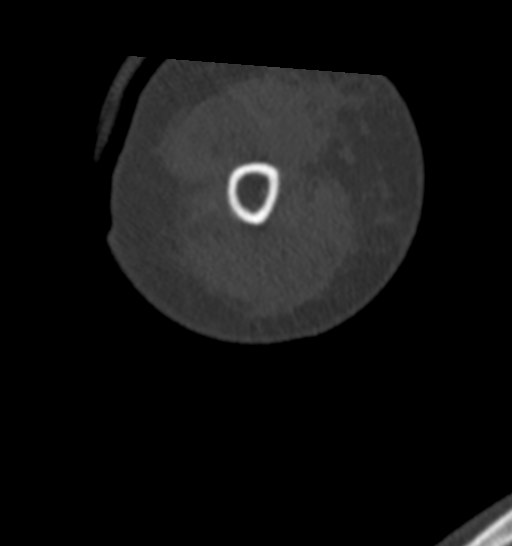
[im 22/143  bone]
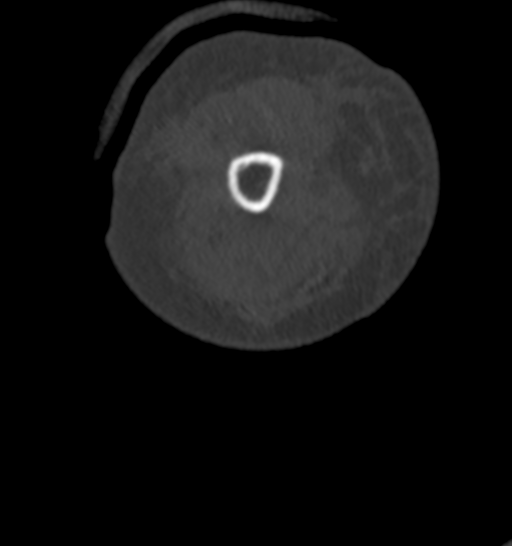
[im 33/143  bone]
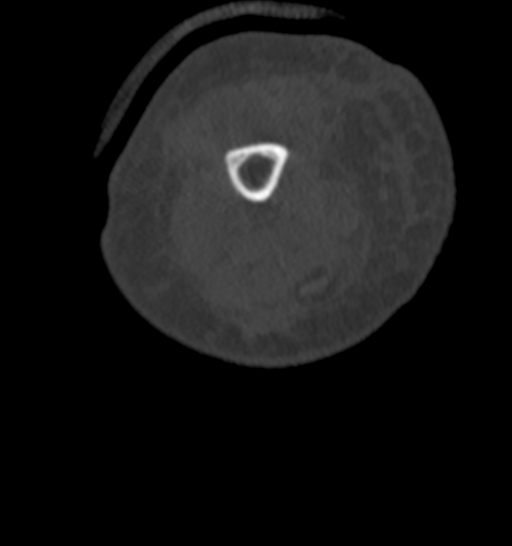
[im 44/143  bone]
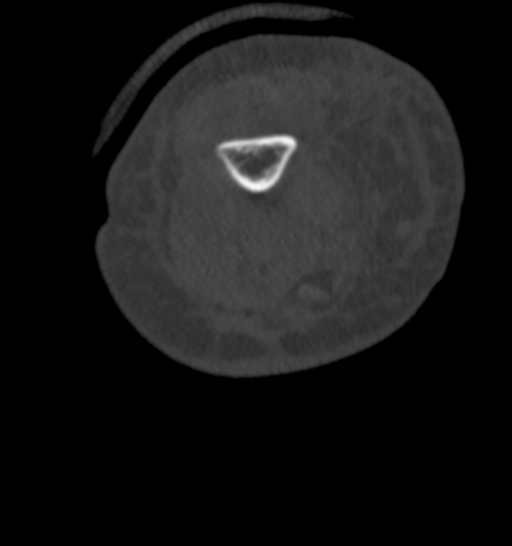
[im 55/143  soft-tissue]
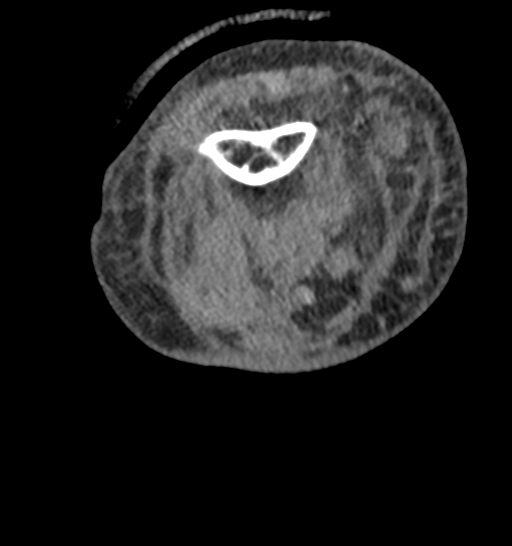
[im 55/143  bone]
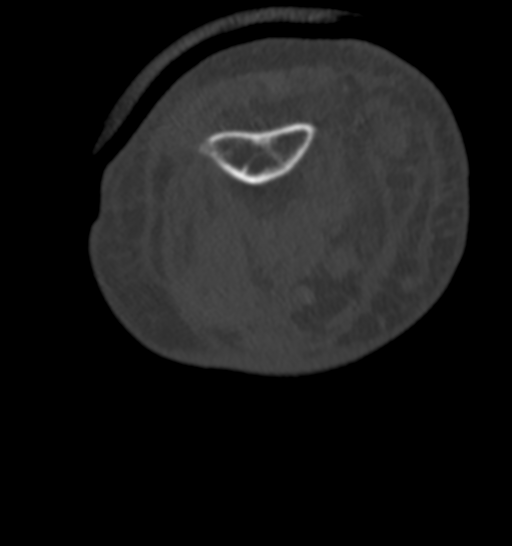
[im 66/143  bone]
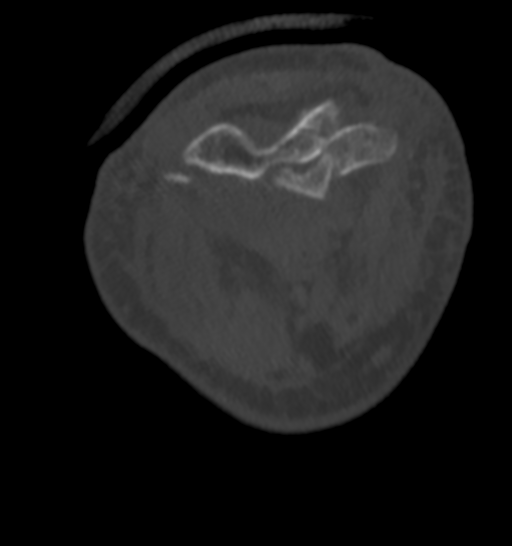
[im 77/143  bone]
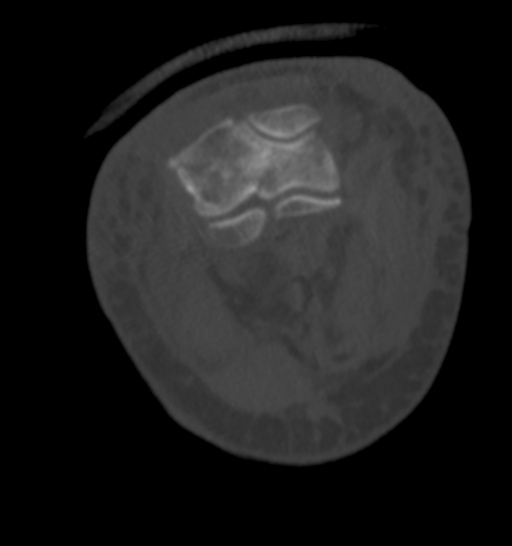
[im 88/143  bone]
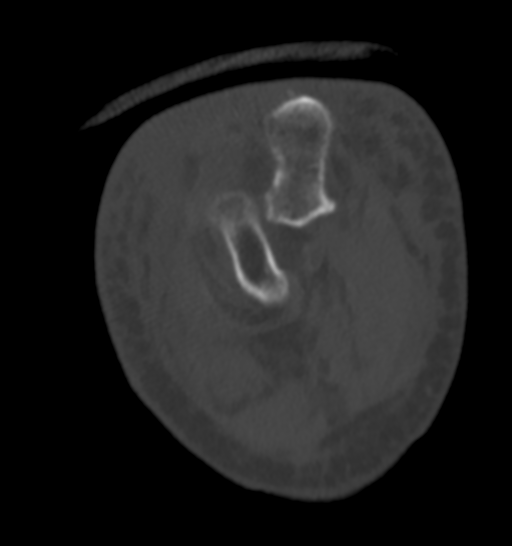
[im 99/143  soft-tissue]
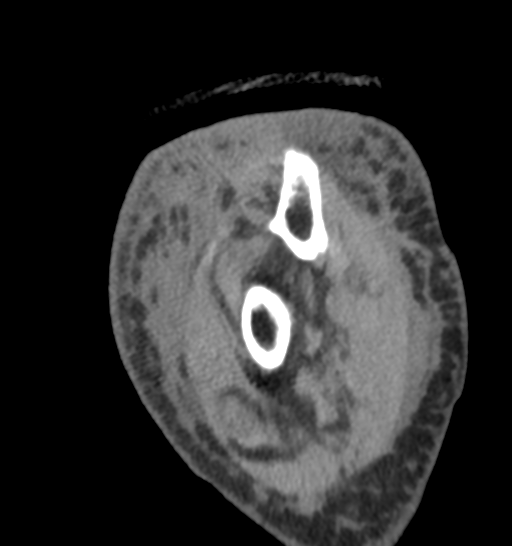
[im 99/143  bone]
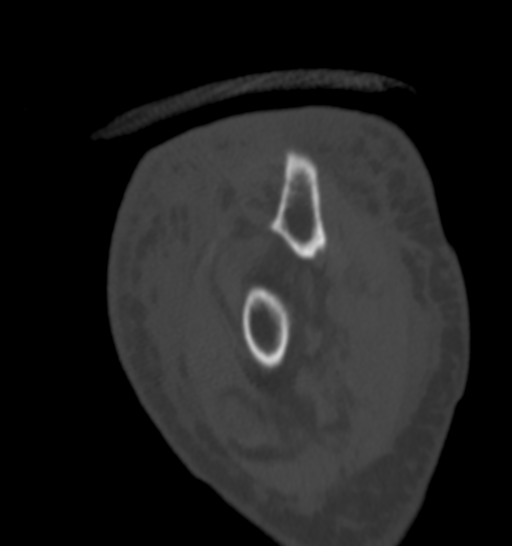
[im 110/143  bone]
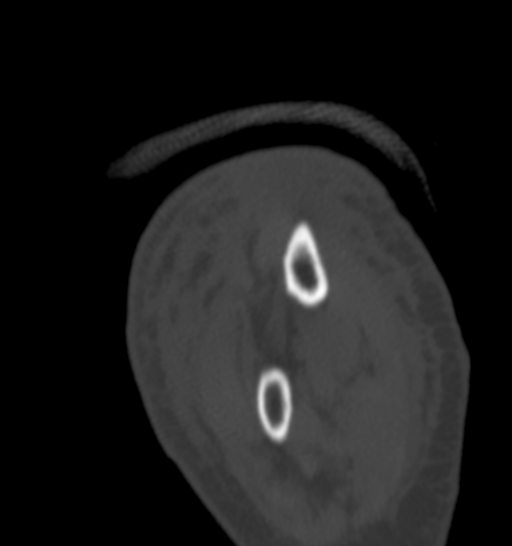
[im 121/143  bone]
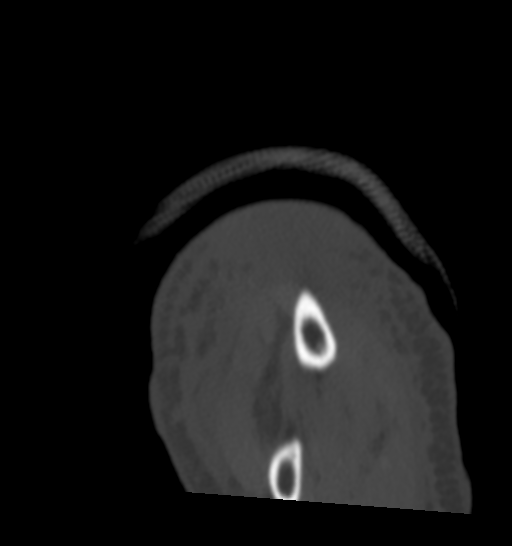
[im 132/143  bone]
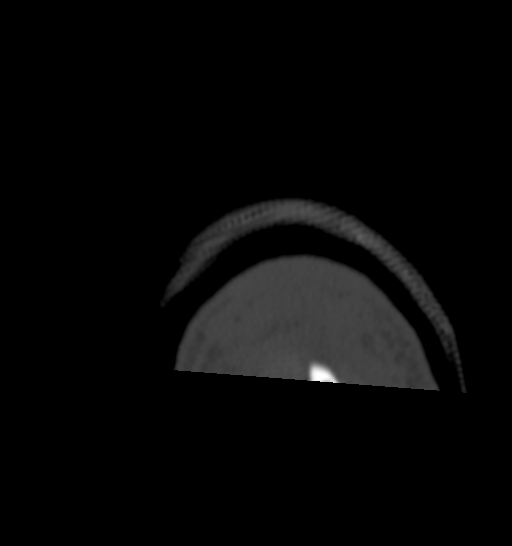

[12 of 14 positions shown; findings below may reference images not displayed]

FINDINGS: Bones/Joint/Cartilage

Proximal radius and ulna appear within normal limits. Comminuted
fracture of the distal humerus is seen with some displacement of the
medial epicondyle fracture fragment and a significant degree of
impaction of the distal fracture fragments into the distal humeral
shaft. No dislocation is noted.

Ligaments

Suboptimally assessed by CT.

Muscles and Tendons

Surrounding musculature appears within normal limits.

Soft tissues

Significant soft tissue swelling is noted as well as joint effusion
with elevation of the anterior and posterior fat pads. Soft tissue
swelling is primarily in the proximal forearm.
IMPRESSION: Comminuted distal humeral fracture with impaction of the fracture
fragments. No dislocation is noted. Associated joint effusion is
seen.

## 2019-12-19 ENCOUNTER — Encounter (HOSPITAL_COMMUNITY): Payer: Self-pay | Admitting: Student

## 2019-12-19 ENCOUNTER — Ambulatory Visit: Payer: Self-pay | Admitting: Student

## 2019-12-19 ENCOUNTER — Other Ambulatory Visit: Payer: Self-pay

## 2019-12-19 DIAGNOSIS — S42401A Unspecified fracture of lower end of right humerus, initial encounter for closed fracture: Secondary | ICD-10-CM | POA: Insufficient documentation

## 2019-12-19 DIAGNOSIS — S42491A Other displaced fracture of lower end of right humerus, initial encounter for closed fracture: Secondary | ICD-10-CM

## 2019-12-19 NOTE — H&P (Addendum)
Orthopaedic Trauma Service (OTS) H&P  Patient ID: [name] MRN: [id number] DOB/AGE: 09-19-1945 74 y.o.  Reason for Surgery: Right distal humerus fracture  HPI: [name] is an 74 y.o. female presenting for surgery of right elbow fracture sustained on 12/11/19 while at work. 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 Her shoe caught on the floor and she fell onto the right arm and left wrist. She had immediate pain in both and radiographs performed in walk-in clinic at that time showed a fracture of the medial and lateral epicondyles of the right elbow. Patient referred to orthopaedic trauma service by Dr. Roland Rack [facility] clinic) due to complexity of injury. Patient seen in [place] clinic on 12/19/19. Noted to have significant bruising to her right hand and left wrist. She denies any numbness, tingling in the fingers. She has been in a long arm splint and sling since time of injury. Patient has minimal pain in the left wrist at this time.  Patient has a history of MI in 2011 and is followed by Dr. Felipa Emory in cardiology. Takes Aspirin 81 mg daily. History of ORIF right distal radius fracture 12/2018.   Past Medical History:  Diagnosis Date  . Abnormal Pap smear of cervix    Dr. Prentice Docker   . Coronary artery disease    a. 06/2009 MI/PCI to LCX and LAD; b. 01/2011 Occluded stents-->CABG x 3 ([facility]): LIMA->LAD, VG->LCX, VG->RPDA; c. 01/2012 Cath: LM 50, LAD 95ost, 160m, LCX 95ost, 121m, RCA 67m, RPDA small, LIMA->LAD ok, VG->LCX ok, VG->RPDA 100-->Med rx.  . Degenerative disc disease, lumbar    L4/5 noted CT ab/pelvis 05/19/11   . DVT (deep venous thrombosis) ([place])    after childbirth age 17  . GERD (gastroesophageal reflux disease)   . Hyperlipidemia   . Hypertension    under control  . Macular degeneration   . Myocardial infarction ([place]) 2011  . Parathyroid adenoma    right 13 x 8 mm s/p resection   . PVD (peripheral vascular disease) ([place])     a. 01/2010 s/p Aortofemoral bypass.    Past Surgical History:  Procedure Laterality Date  . CARDIAC CATHETERIZATION  2013   [facility]  . CATARACT EXTRACTION    . COLONOSCOPY    . COLPOSCOPY    . CORONARY ARTERY BYPASS GRAFT  02/07/2010   x 3  . ERCP N/A 10/05/2019   Procedure: ENDOSCOPIC RETROGRADE CHOLANGIOPANCREATOGRAPHY (ERCP);  Surgeon: Lucilla Lame, MD;  Location: [facility] ENDOSCOPY;  Service: Endoscopy;  Laterality: N/A;  . FACIAL COSMETIC SURGERY  2013   Dr. Nadeen Landau  . FOOT SURGERY     right  . ILIO-FEMORAL BYPASS GRAFT  2011  . LIVER BIOPSY N/A 10/31/2019   Procedure: LIVER BIOPSY;  Surgeon: Olean Ree, MD;  Location: [facility] ORS;  Service: General;  Laterality: N/A;  . MITRAL VALVE REPAIR  01/2010  . ORIF ANKLE FRACTURE Right 10/08/2016   Procedure: OPEN REDUCTION INTERNAL FIXATION (ORIF) ANKLE FRACTURE;  Surgeon: Corky Mull, MD;  Location: [facility] ORS;  Service: Orthopedics;  Laterality: Right;  . ORIF WRIST FRACTURE Right 12/29/2018   Procedure: OPEN REDUCTION INTERNAL FIXATION (ORIF) WRIST FRACTURE;  Surgeon: Corky Mull, MD;  Location: [facility] ORS;  Service: Orthopedics;  Laterality: Right;  . PARATHYROIDECTOMY    . TUBAL LIGATION      Family History  Problem Relation Age of Onset  . Heart disease Father     Social History:  reports that  she quit smoking about 10 years ago. Her smoking use included cigarettes. She has a 44.00 pack-year smoking history. She has never used smokeless tobacco. She reports current alcohol use of about 1.0 standard drink of alcohol per week. She reports that she does not use drugs.  Allergies: No Known Allergies  Medications:  Prior to Admission:  No medications prior to admission.    ROS: Constitutional: No fever or chills Vision: No changes in vision ENT: No difficulty swallowing CV: No chest pain Pulm: No SOB or wheezing GI: No nausea or vomiting GU: No urgency or inability to hold urine Skin: No poor wound  healing Neurologic: No numbness or tingling Psychiatric: No depression or anxiety Heme: +Bruising right elbow, forearm, hand, left wrist Allergic: No reaction to medications or food   Exam: Height 5' (1.524 m), weight 60.3 kg. General: No acute distress Orientation: Alert and oriented x4 Mood and Affect: Mood and affect appropriate, pleasant and cooperative Gait: Normal reciprocal gait Coordination and balance: Within normal limits  Right upper extremity: Skin over the right elbow is clean and dry. Ecchymosis and edema noted over the medial and lateral aspects of the elbow, extending down the forearm.  Range of motion examination of the elbow deferred.  Able to actively flex and extend the right wrist. Moderate edema noted over the right hand. Patient able to flex and extend the fingers of the right hand. Sensation and motor function is intact over the median, radial, and ulnar nerve distributions. Radial pulse 2+  Left upper extremity: Ecchymosis over the volar aspect of wrist.  Mildly tender over the radial styloid. Full range of motion of the left wrist. Neurovascularly intact.   Medical Decision Making: Data: Imaging: AP and lateral views of the right elbow shows fracture involving the lateral medial epicondyle with possible intra-articular extension. AP and lateral views of left wrist show nondisplaced fracture of radial styloid  Labs: No results found for this or any previous visit (from the past 168 hour(s)).   Assessment/Plan: 74 year old female s/p fall, resulting in right distal humerus fracture.  Patient with significant injury to right upper extremity with require surgical intervention.  Recommend proceeding with open reduction internal fixation of the right distal humerus fracture.  Risk and benefits of procedure discussed with the patient. Risks discussed included bleeding, infection, malunion, nonunion, damage to surrounding nerves and blood vessels, pain, hardware  prominence or irritation, hardware failure, stiffness, post-traumatic arthritis, DVT/PE, and anesthesia complications.  Patient states understanding these risks and agrees to proceed with surgery.  We will plan to treat left radial styloid fracture non-operatively in a removable wrist splint. Plan for patient be discharged from PACU postoperatively   Ranyah Groeneveld A. Carmie [facility] [phone] (office) [email or web address]

## 2019-12-19 NOTE — Progress Notes (Signed)
[name] denies chest pain or shortness of breath.  Patient denies s/s of Covid and has not had connect with anyone who has Covid.  Brittany Cillo will be tested on arrival in am.

## 2019-12-20 ENCOUNTER — Ambulatory Visit (HOSPITAL_COMMUNITY)
Admission: RE | Admit: 2019-12-20 | Discharge: 2019-12-20 | Disposition: A | Discharge status: Home / Self Care | Payer: Medicare HMO | Attending: Student | Admitting: Student

## 2019-12-20 ENCOUNTER — Ambulatory Visit (HOSPITAL_COMMUNITY): Payer: Medicare HMO | Admitting: Anesthesiology

## 2019-12-20 ENCOUNTER — Encounter (HOSPITAL_COMMUNITY)
Admission: RE | Disposition: A | Discharge status: Home / Self Care | Payer: Self-pay | Source: Home / Self Care | Attending: Student

## 2019-12-20 ENCOUNTER — Ambulatory Visit (HOSPITAL_COMMUNITY): Payer: Medicare HMO

## 2019-12-20 ENCOUNTER — Encounter (HOSPITAL_COMMUNITY): Payer: Self-pay | Admitting: Student

## 2019-12-20 DIAGNOSIS — S60221A Contusion of right hand, initial encounter: Secondary | ICD-10-CM | POA: Insufficient documentation

## 2019-12-20 DIAGNOSIS — I2510 Atherosclerotic heart disease of native coronary artery without angina pectoris: Secondary | ICD-10-CM | POA: Insufficient documentation

## 2019-12-20 DIAGNOSIS — Z951 Presence of aortocoronary bypass graft: Secondary | ICD-10-CM | POA: Insufficient documentation

## 2019-12-20 DIAGNOSIS — Z87891 Personal history of nicotine dependence: Secondary | ICD-10-CM | POA: Diagnosis not present

## 2019-12-20 DIAGNOSIS — Z8249 Family history of ischemic heart disease and other diseases of the circulatory system: Secondary | ICD-10-CM | POA: Diagnosis not present

## 2019-12-20 DIAGNOSIS — I10 Essential (primary) hypertension: Secondary | ICD-10-CM | POA: Insufficient documentation

## 2019-12-20 DIAGNOSIS — E890 Postprocedural hypothyroidism: Secondary | ICD-10-CM | POA: Diagnosis not present

## 2019-12-20 DIAGNOSIS — Z7982 Long term (current) use of aspirin: Secondary | ICD-10-CM | POA: Diagnosis not present

## 2019-12-20 DIAGNOSIS — T1490XA Injury, unspecified, initial encounter: Secondary | ICD-10-CM

## 2019-12-20 DIAGNOSIS — I252 Old myocardial infarction: Secondary | ICD-10-CM | POA: Insufficient documentation

## 2019-12-20 DIAGNOSIS — I739 Peripheral vascular disease, unspecified: Secondary | ICD-10-CM | POA: Insufficient documentation

## 2019-12-20 DIAGNOSIS — T148XXA Other injury of unspecified body region, initial encounter: Secondary | ICD-10-CM

## 2019-12-20 DIAGNOSIS — S42411A Displaced simple supracondylar fracture without intercondylar fracture of right humerus, initial encounter for closed fracture: Secondary | ICD-10-CM | POA: Diagnosis present

## 2019-12-20 DIAGNOSIS — Z955 Presence of coronary angioplasty implant and graft: Secondary | ICD-10-CM | POA: Insufficient documentation

## 2019-12-20 DIAGNOSIS — S42491A Other displaced fracture of lower end of right humerus, initial encounter for closed fracture: Secondary | ICD-10-CM

## 2019-12-20 DIAGNOSIS — W1830XA Fall on same level, unspecified, initial encounter: Secondary | ICD-10-CM | POA: Diagnosis not present

## 2019-12-20 DIAGNOSIS — Z20822 Contact with and (suspected) exposure to covid-19: Secondary | ICD-10-CM | POA: Insufficient documentation

## 2019-12-20 DIAGNOSIS — Z86718 Personal history of other venous thrombosis and embolism: Secondary | ICD-10-CM | POA: Diagnosis not present

## 2019-12-20 DIAGNOSIS — S42401A Unspecified fracture of lower end of right humerus, initial encounter for closed fracture: Secondary | ICD-10-CM

## 2019-12-20 HISTORY — DX: Acute embolism and thrombosis of unspecified deep veins of unspecified lower extremity: I82.409

## 2019-12-20 HISTORY — PX: ORIF HUMERUS FRACTURE: SHX2126

## 2019-12-20 LAB — BASIC METABOLIC PANEL
Anion gap: 10 (ref 5–15)
BUN: 16 mg/dL (ref 8–23)
CO2: 23 mmol/L (ref 22–32)
Calcium: 9.9 mg/dL (ref 8.9–10.3)
Chloride: 103 mmol/L (ref 98–111)
Creatinine, Ser: 0.69 mg/dL (ref 0.44–1.00)
GFR calc Af Amer: >60 mL/min (ref >60)
GFR calc non Af Amer: >60 mL/min (ref >60)
Glucose, Bld: 103 mg/dL — ABNORMAL HIGH (ref 70–99)
Potassium: 4.3 mmol/L (ref 3.5–5.1)
Sodium: 136 mmol/L (ref 135–145)

## 2019-12-20 LAB — CBC
HCT: 35.7 % — ABNORMAL LOW (ref 36.0–46.0)
Hemoglobin: 11.3 g/dL — ABNORMAL LOW (ref 12.0–15.0)
MCH: 30.5 pg (ref 26.0–34.0)
MCHC: 31.7 g/dL (ref 30.0–36.0)
MCV: 96.2 fL (ref 80.0–100.0)
Platelets: 223 10*3/uL (ref 150–400)
RBC: 3.71 MIL/uL — ABNORMAL LOW (ref 3.87–5.11)
RDW: 15.5 % (ref 11.5–15.5)
WBC: 6 10*3/uL (ref 4.0–10.5)
nRBC: 0 % (ref 0.0–0.2)

## 2019-12-20 LAB — SARS CORONAVIRUS 2 BY RT PCR (HOSPITAL ORDER, PERFORMED IN ~~LOC~~ HOSPITAL LAB): SARS Coronavirus 2: NEGATIVE

## 2019-12-20 IMAGING — DX DG ELBOW 2V*R*
3 series · 3 of 3 positions shown · non-contrast
Comparison: None.

CLINICAL DATA: Postoperative evaluation.

EXAM:
RIGHT ELBOW - 2 VIEW

[elbow ap (1 of 2)]
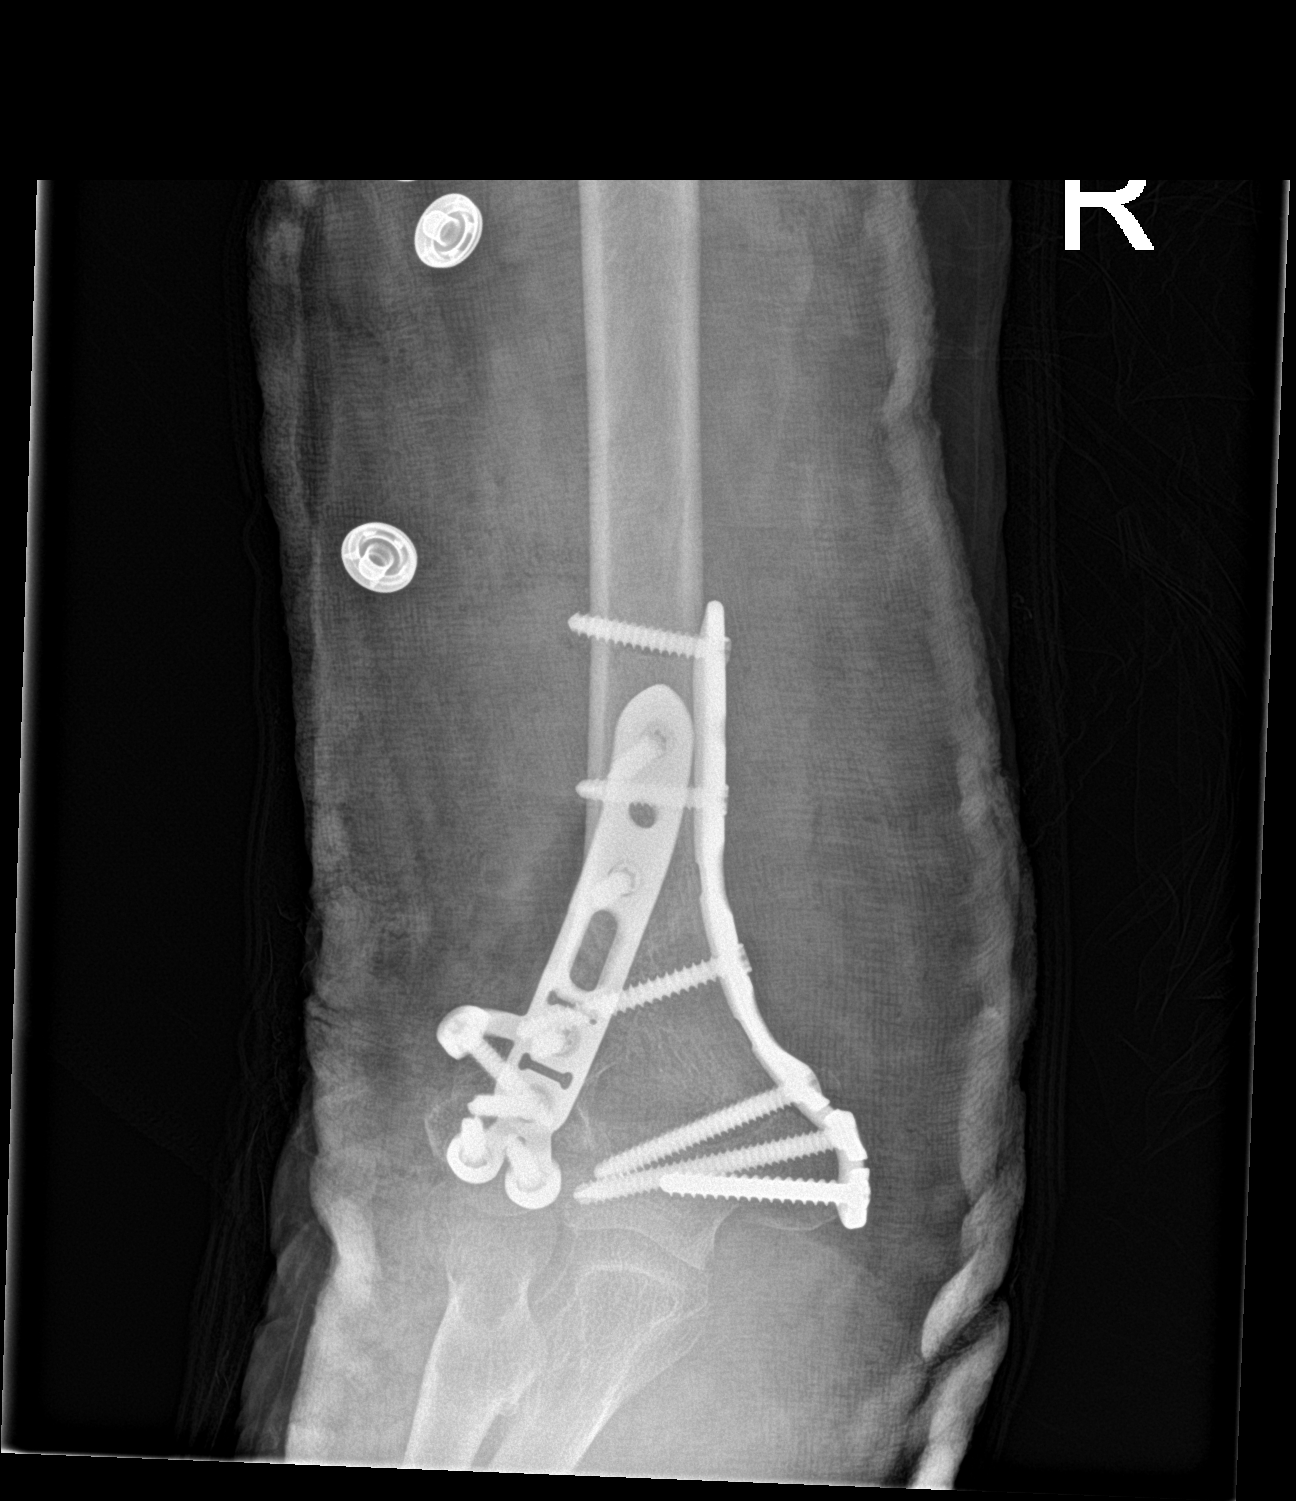

[elbow lat]
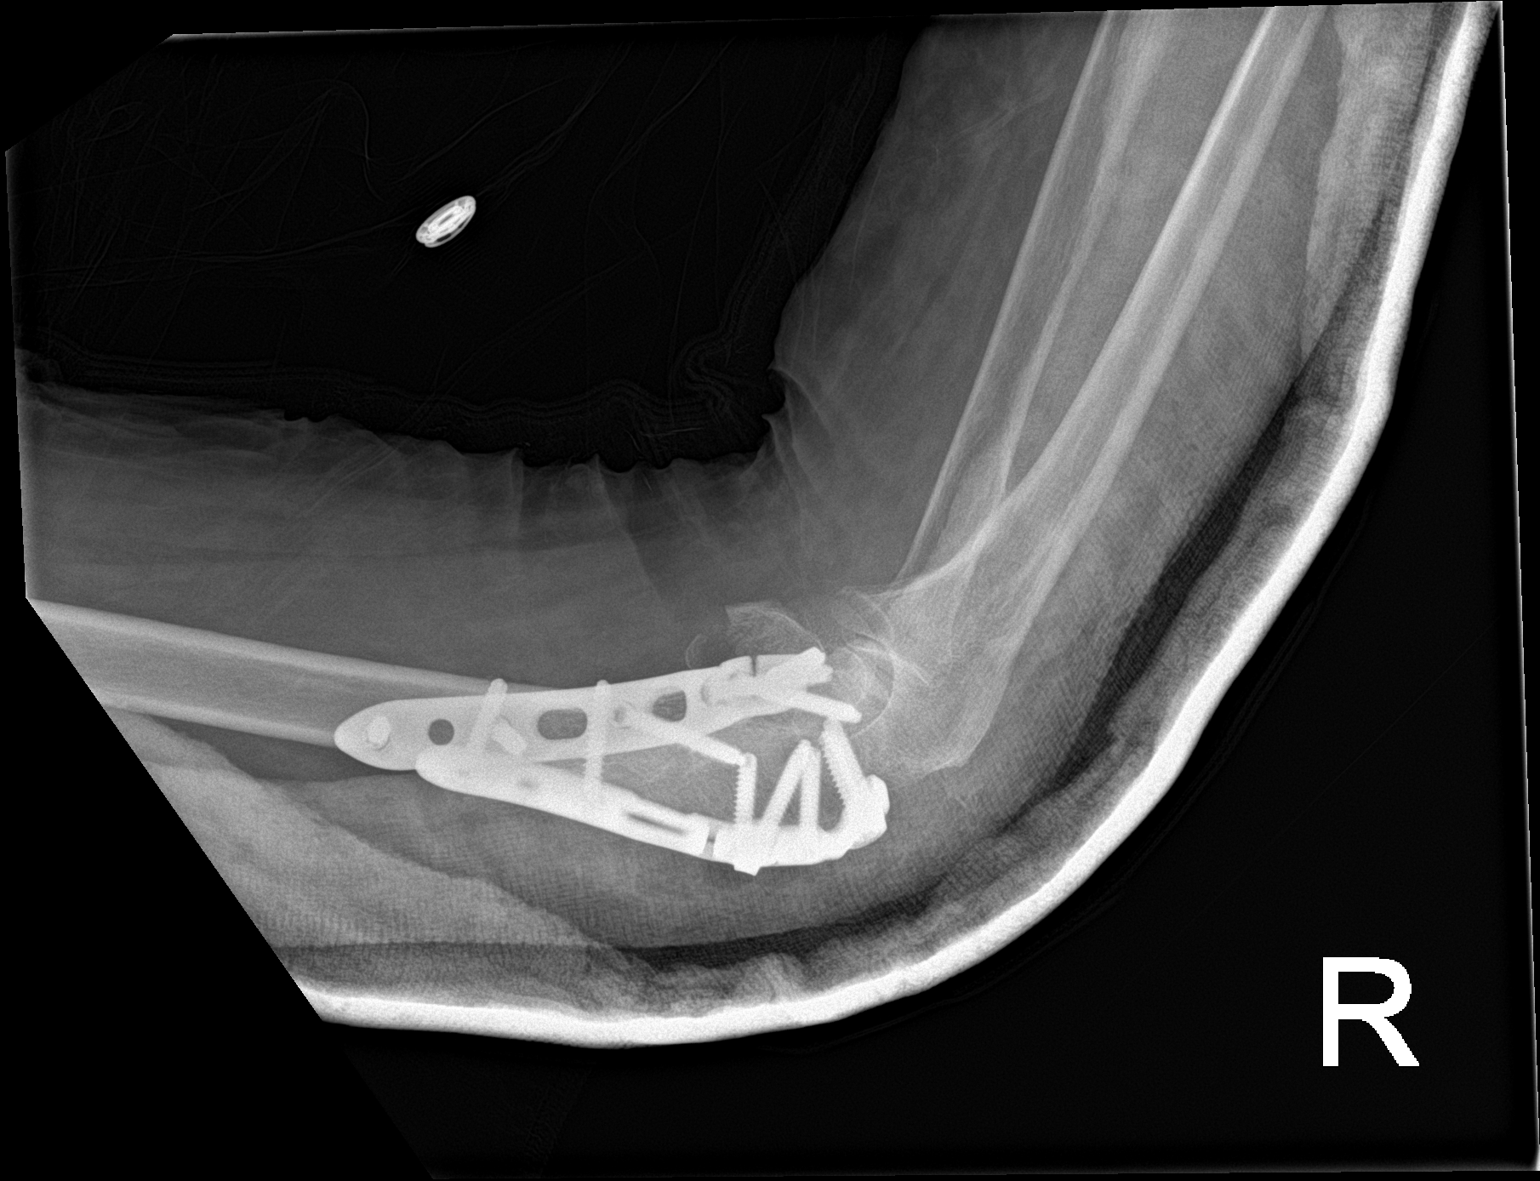

[elbow ap (2 of 2)]
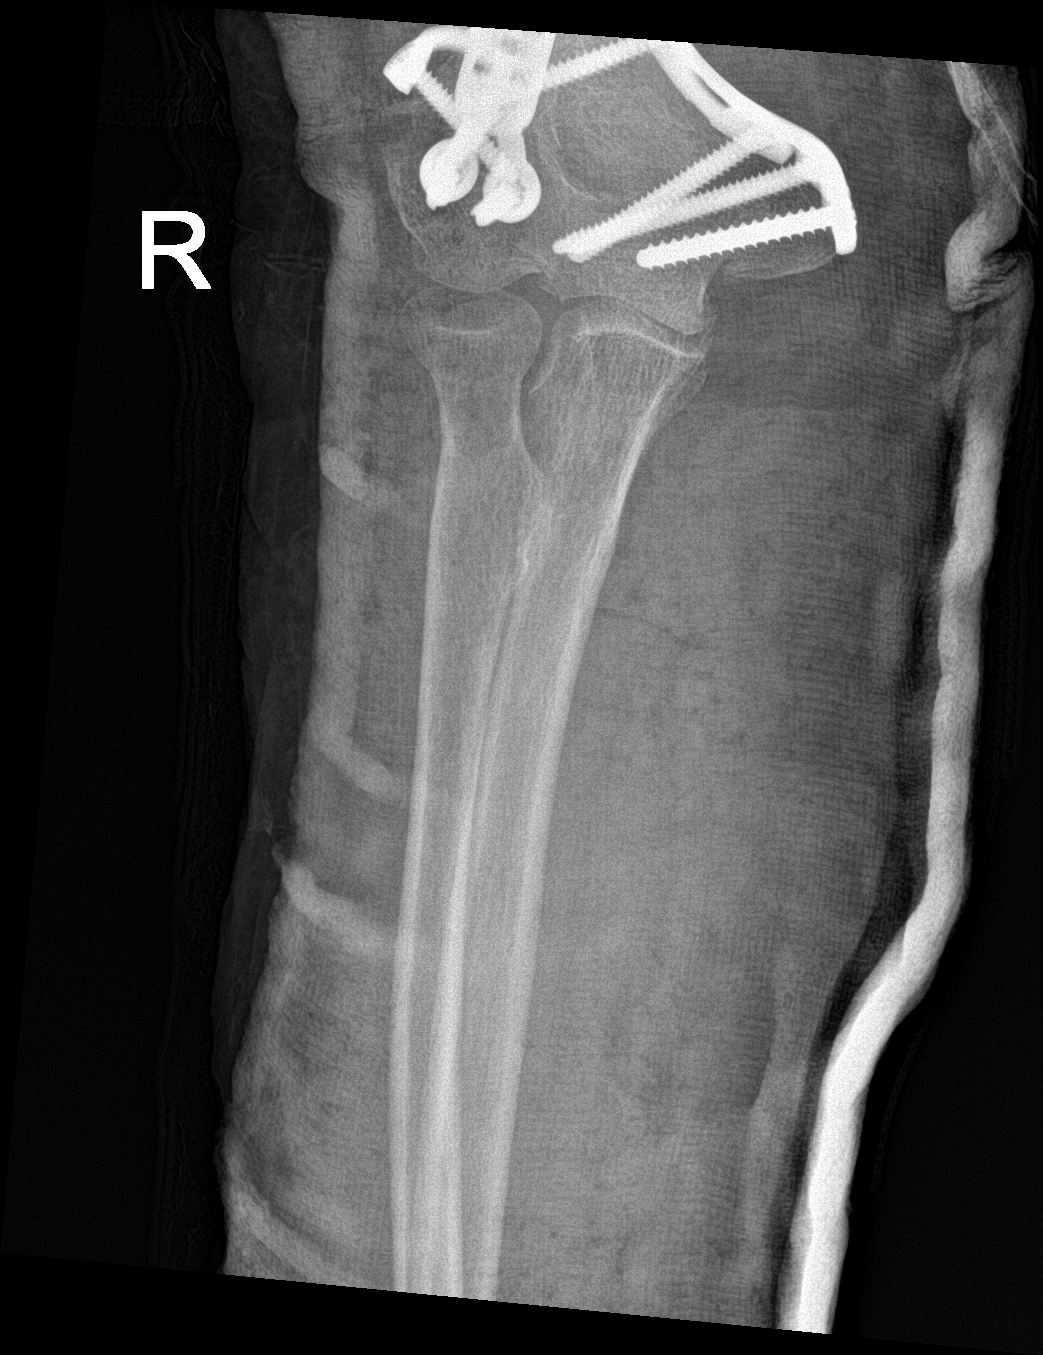

[3 of 3 positions shown; findings below may reference images not displayed]

FINDINGS: The right elbow was imaged in a plaster cast with subsequently
obscured osseous and soft tissue detail. Radiopaque fixation plates
and screws are seen along the medial and lateral aspects of the
distal right humerus. Well-aligned fracture deformities are noted
within these regions. There is no evidence of dislocation. Soft
tissues are unremarkable.
IMPRESSION: Status post open reduction internal fixation of distal right humeral
fractures.

## 2019-12-20 IMAGING — RF DG C-ARM 1-60 MIN
1 series · 4 of 4 positions shown · non-contrast
Comparison: None.

CLINICAL DATA: Open reduction internal fixation of a distal right
humerus fracture.

EXAM:
RIGHT ELBOW - 2 VIEW; DG C-ARM 1-60 MIN

[Series 1: run · 4 of 4 slices shown]
[im 1/4]
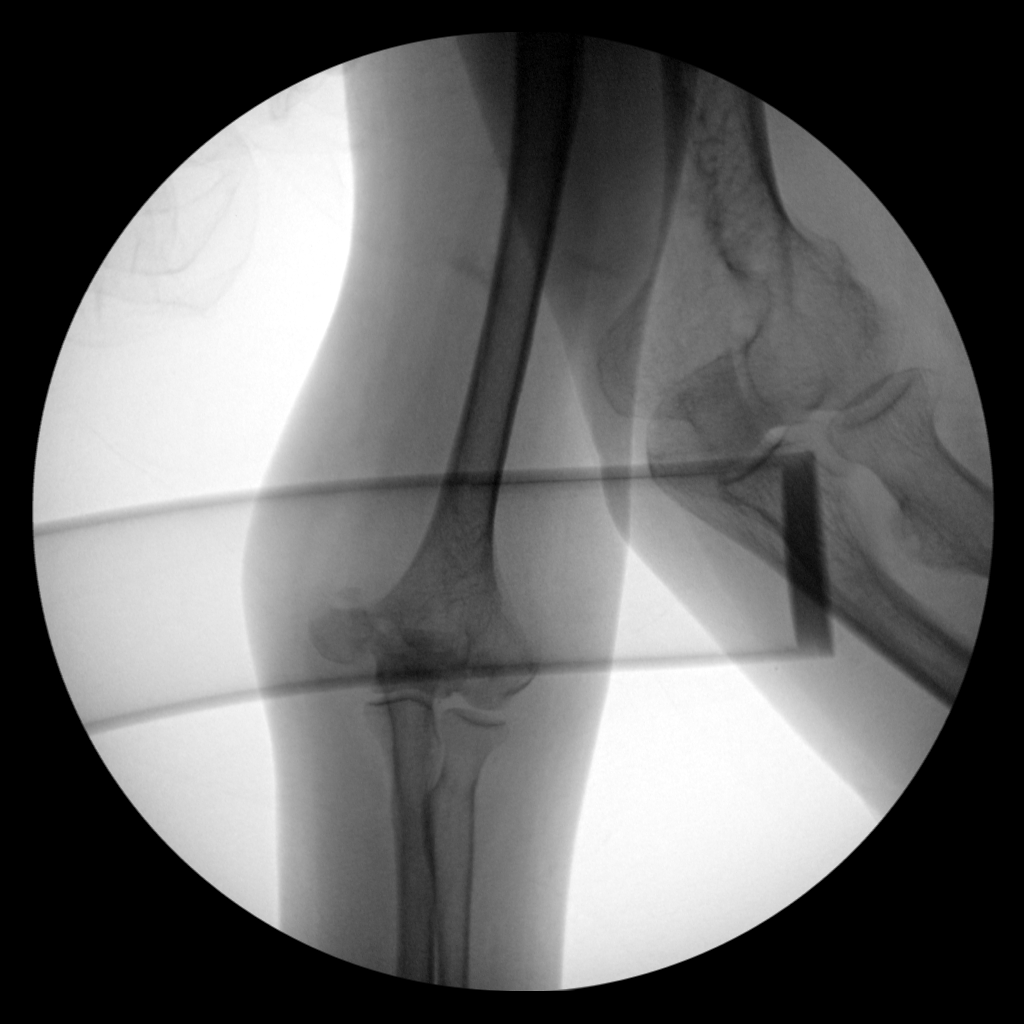
[im 2/4]
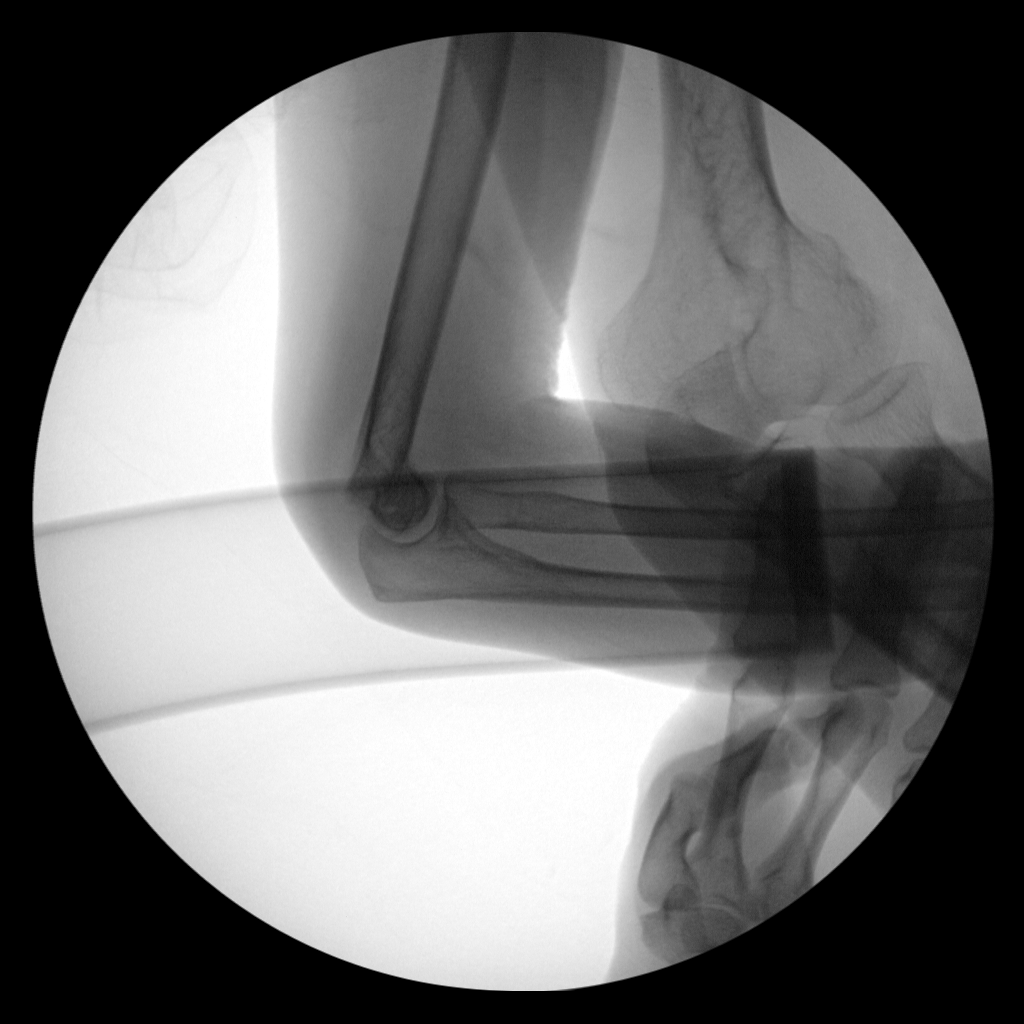
[im 3/4]
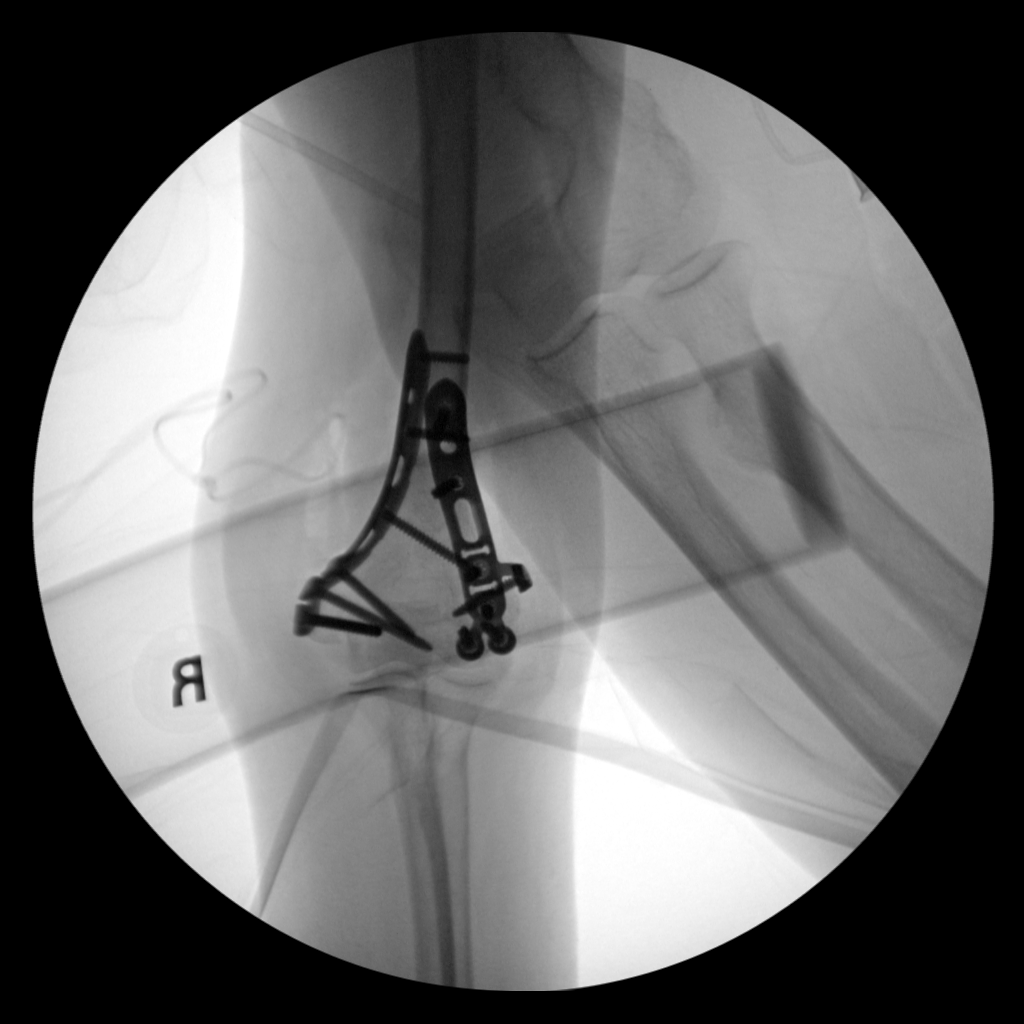
[im 4/4]
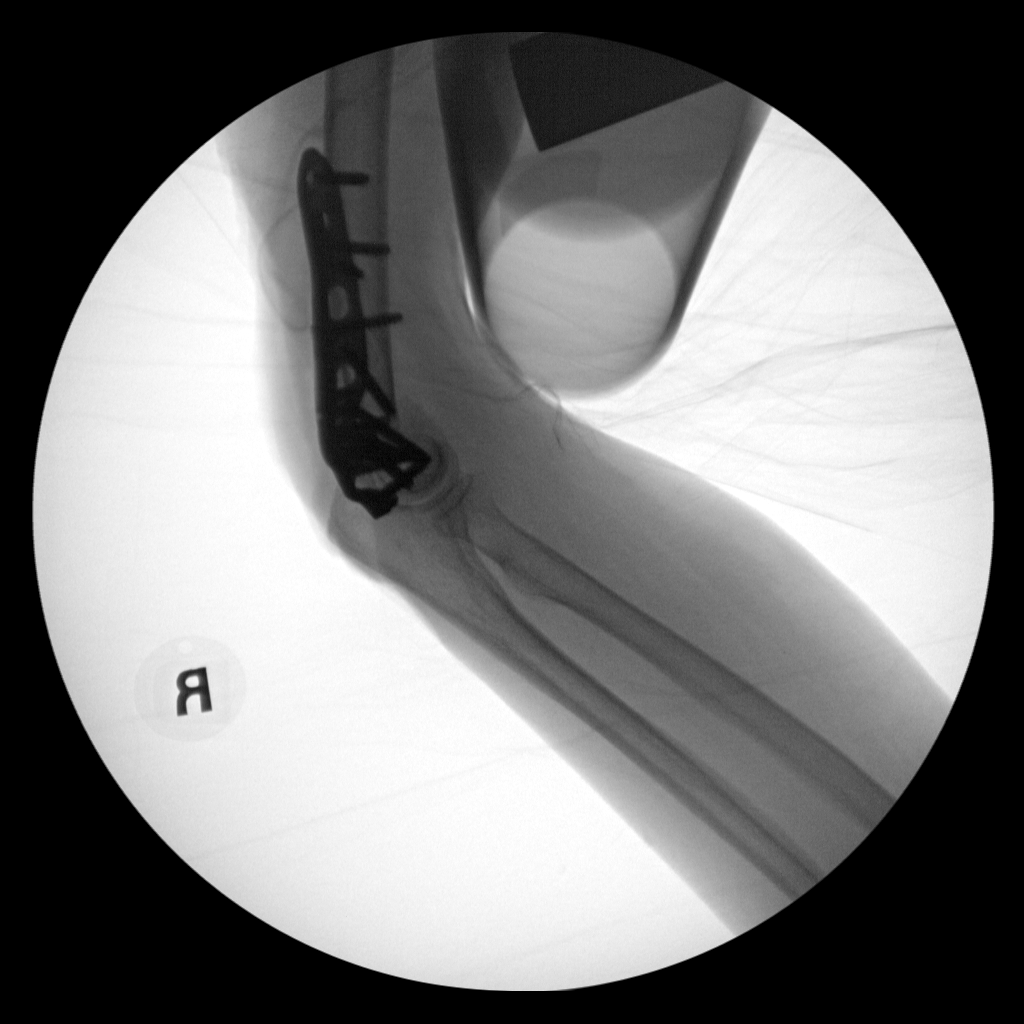

[4 of 4 positions shown; findings below may reference images not displayed]

FINDINGS: 4 C-arm fluoroscopic images were obtained intraoperatively and
submitted for post operative interpretation. Images demonstrate ORIF
of the previously characterized comminuted distal humeral fracture.
Please see the performing provider's procedural report for further
detail.
IMPRESSION: Intraoperative fluoroscopy, as above.

## 2019-12-20 IMAGING — RF DG ELBOW 2V*R*
1 series · 4 of 4 positions shown · non-contrast
Comparison: None.

CLINICAL DATA: Open reduction internal fixation of a distal right
humerus fracture.

EXAM:
RIGHT ELBOW - 2 VIEW; DG C-ARM 1-60 MIN

[Series 1: run · 4 of 4 slices shown]
[im 1/4]
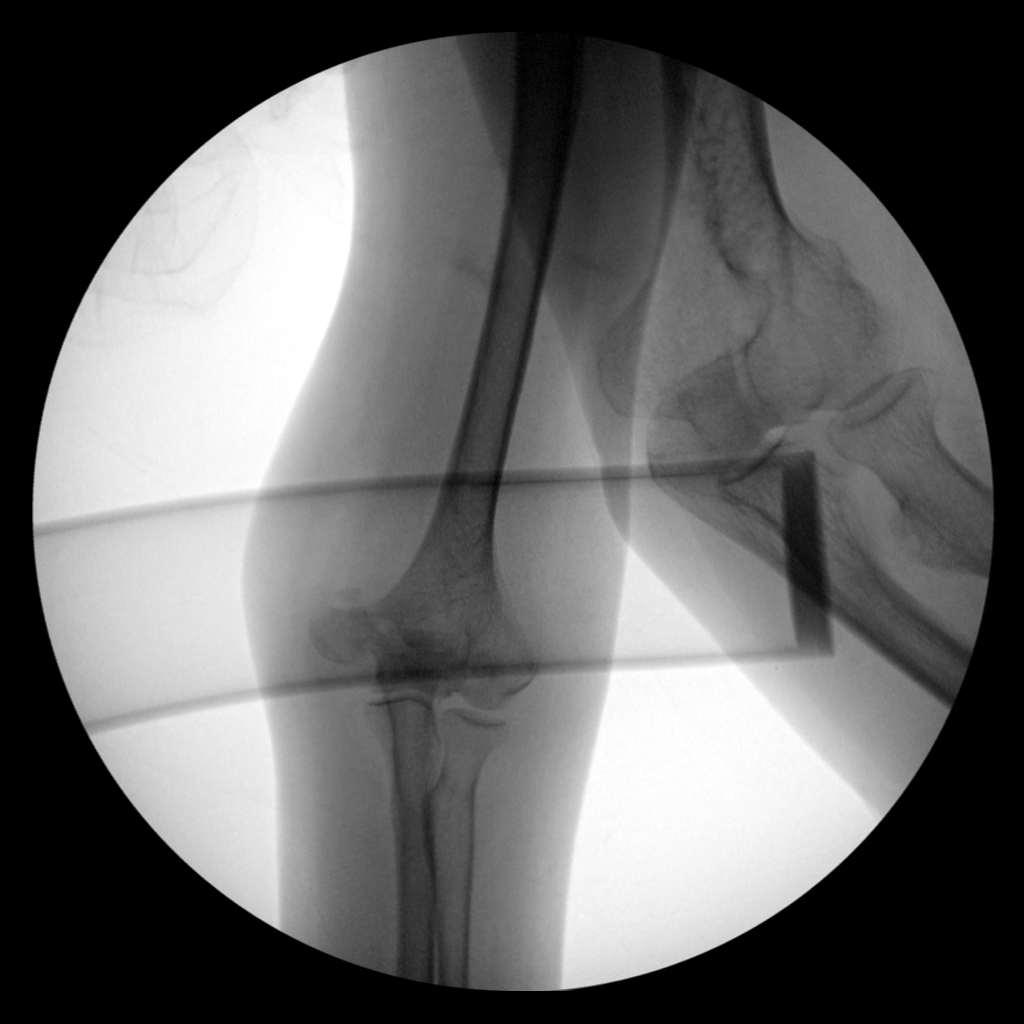
[im 2/4]
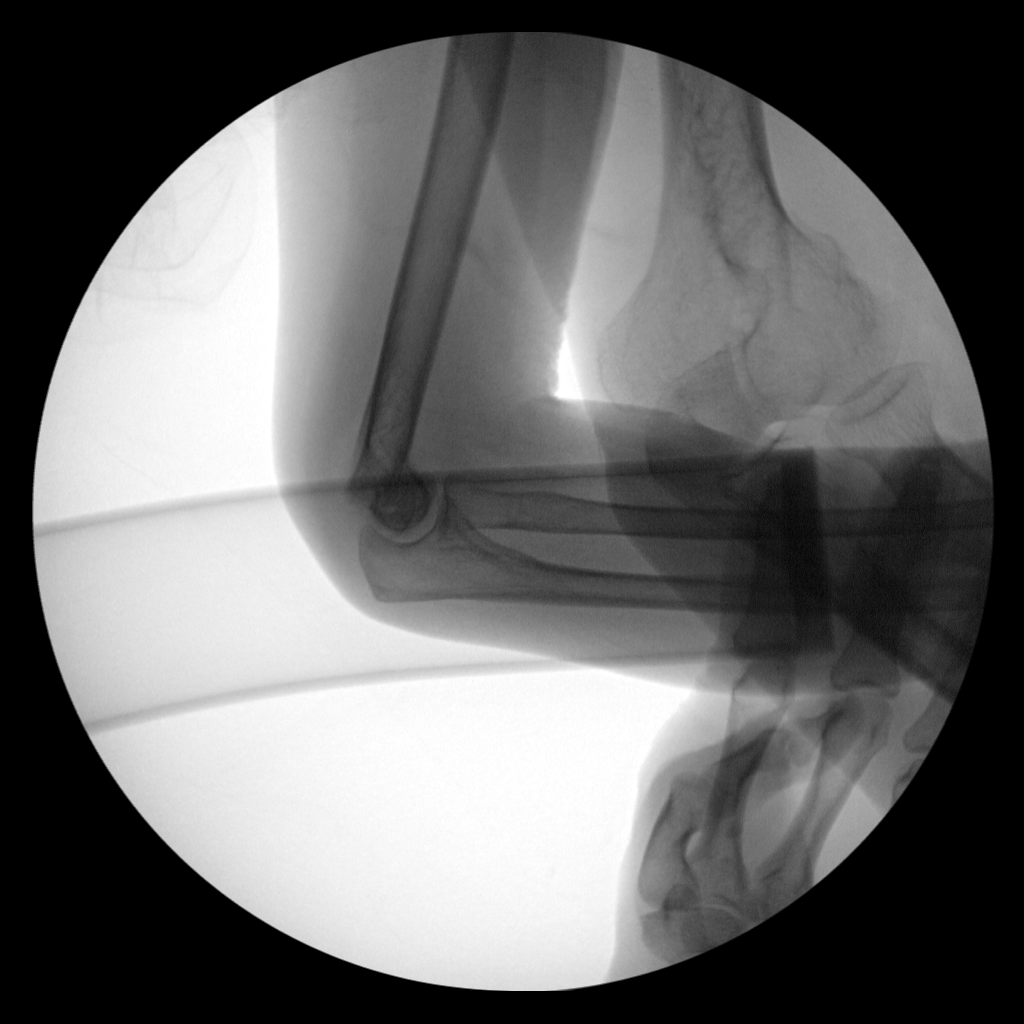
[im 3/4]
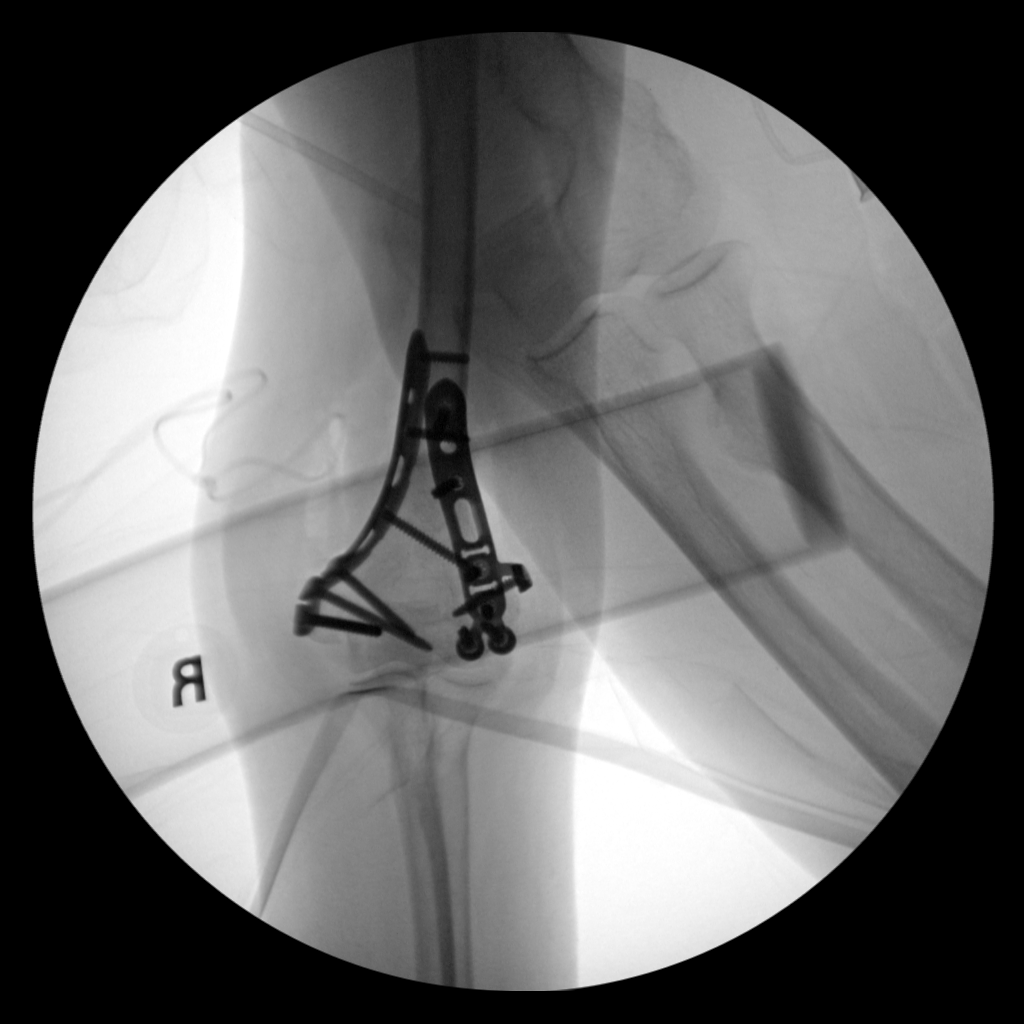
[im 4/4]
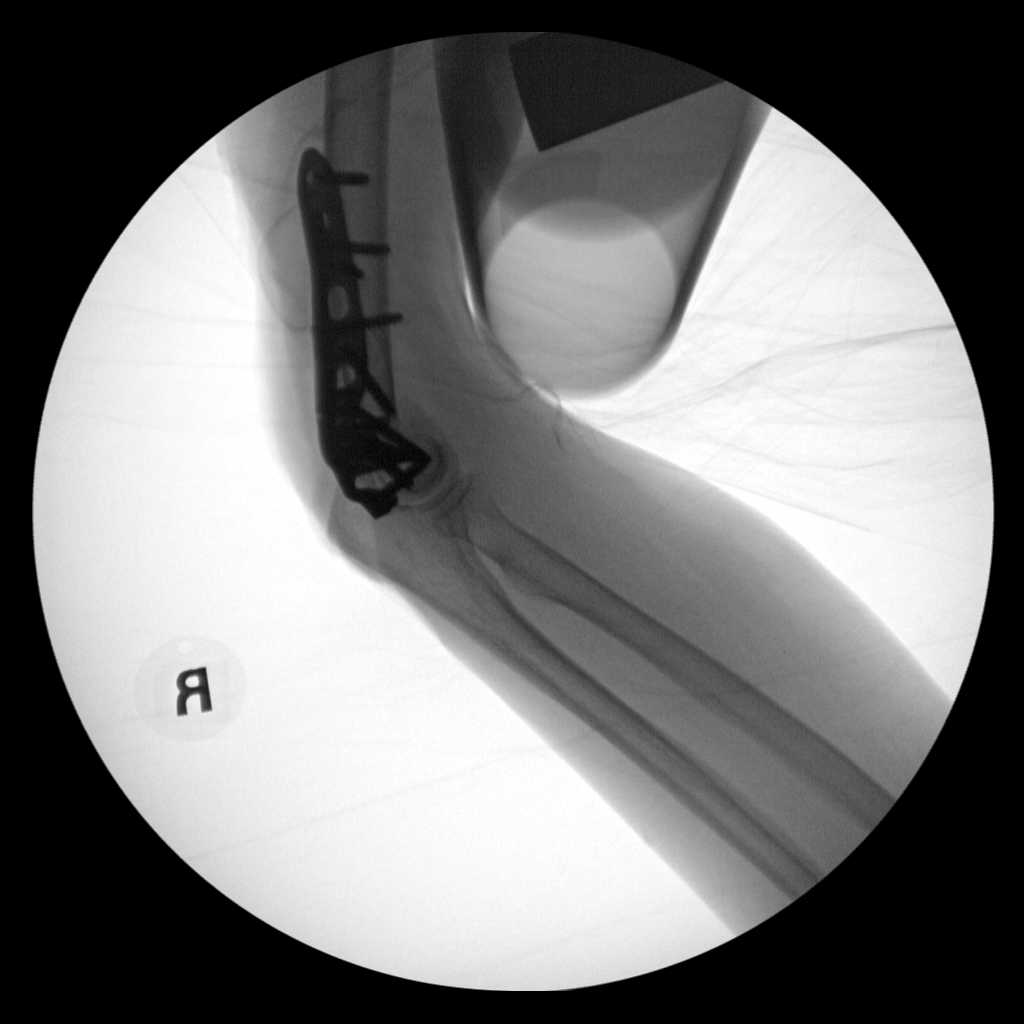

[4 of 4 positions shown; findings below may reference images not displayed]

FINDINGS: 4 C-arm fluoroscopic images were obtained intraoperatively and
submitted for post operative interpretation. Images demonstrate ORIF
of the previously characterized comminuted distal humeral fracture.
Please see the performing provider's procedural report for further
detail.
IMPRESSION: Intraoperative fluoroscopy, as above.

## 2019-12-20 SURGERY — OPEN REDUCTION INTERNAL FIXATION (ORIF) DISTAL HUMERUS FRACTURE
Anesthesia: Monitor Anesthesia Care | Site: Elbow | Laterality: Right

## 2019-12-20 MED ORDER — MIDAZOLAM HCL 2 MG/2ML IJ SOLN
INTRAMUSCULAR | Status: AC
  Filled 2019-12-20: qty 2

## 2019-12-20 MED ORDER — ACETAMINOPHEN 500 MG PO TABS
1000.0000 mg | ORAL_TABLET | Freq: Once | ORAL | Status: DC
  Filled 2019-12-20: qty 2

## 2019-12-20 MED ORDER — PHENYLEPHRINE HCL-NACL 10-0.9 MG/250ML-% IV SOLN
INTRAVENOUS | Status: DC | PRN
  Administered 2019-12-20: 50 ug/min via INTRAVENOUS

## 2019-12-20 MED ORDER — VANCOMYCIN HCL 1000 MG IV SOLR
INTRAVENOUS | Status: DC | PRN
  Administered 2019-12-20: 1000 mg

## 2019-12-20 MED ORDER — ONDANSETRON HCL 4 MG/2ML IJ SOLN
INTRAMUSCULAR | Status: AC
  Filled 2019-12-20: qty 2

## 2019-12-20 MED ORDER — PHENYLEPHRINE 40 MCG/ML (10ML) SYRINGE FOR IV PUSH (FOR BLOOD PRESSURE SUPPORT)
PREFILLED_SYRINGE | INTRAVENOUS | Status: DC | PRN
  Administered 2019-12-20: 200 ug via INTRAVENOUS
  Administered 2019-12-20: 160 ug via INTRAVENOUS

## 2019-12-20 MED ORDER — FENTANYL CITRATE (PF) 100 MCG/2ML IJ SOLN: 25.0000 ug | INTRAMUSCULAR | Status: DC | PRN

## 2019-12-20 MED ORDER — PROPOFOL 10 MG/ML IV BOLUS
INTRAVENOUS | Status: DC | PRN
  Administered 2019-12-20: 80 mg via INTRAVENOUS
  Administered 2019-12-20 (×2): 20 mg via INTRAVENOUS

## 2019-12-20 MED ORDER — PROMETHAZINE HCL 25 MG/ML IJ SOLN: 6.2500 mg | INTRAMUSCULAR | Status: DC | PRN

## 2019-12-20 MED ORDER — 0.9 % SODIUM CHLORIDE (POUR BTL) OPTIME
TOPICAL | Status: DC | PRN
  Administered 2019-12-20: 1000 mL

## 2019-12-20 MED ORDER — CHLORHEXIDINE GLUCONATE 4 % EX LIQD: 60.0000 mL | Freq: Once | CUTANEOUS | Status: DC

## 2019-12-20 MED ORDER — FENTANYL CITRATE (PF) 100 MCG/2ML IJ SOLN
INTRAMUSCULAR | Status: AC
  Administered 2019-12-20: 50 ug via INTRAVENOUS
  Filled 2019-12-20: qty 2

## 2019-12-20 MED ORDER — FENTANYL CITRATE (PF) 100 MCG/2ML IJ SOLN: 50.0000 ug | Freq: Once | INTRAMUSCULAR | Status: AC

## 2019-12-20 MED ORDER — PROPOFOL 500 MG/50ML IV EMUL
INTRAVENOUS | Status: DC | PRN
  Administered 2019-12-20: 50 ug/kg/min via INTRAVENOUS

## 2019-12-20 MED ORDER — CEFAZOLIN SODIUM-DEXTROSE 2-4 GM/100ML-% IV SOLN
2.0000 g | INTRAVENOUS | Status: AC
  Administered 2019-12-20: 2 g via INTRAVENOUS
  Filled 2019-12-20: qty 100

## 2019-12-20 MED ORDER — DEXAMETHASONE SODIUM PHOSPHATE 10 MG/ML IJ SOLN
INTRAMUSCULAR | Status: AC
  Filled 2019-12-20: qty 1

## 2019-12-20 MED ORDER — FENTANYL CITRATE (PF) 250 MCG/5ML IJ SOLN
INTRAMUSCULAR | Status: AC
  Filled 2019-12-20: qty 5

## 2019-12-20 MED ORDER — CHLORHEXIDINE GLUCONATE 0.12 % MT SOLN
15.0000 mL | Freq: Once | OROMUCOSAL | Status: AC
  Administered 2019-12-20: 15 mL via OROMUCOSAL
  Filled 2019-12-20: qty 15

## 2019-12-20 MED ORDER — ACETAMINOPHEN 325 MG PO TABS
650.0000 mg | ORAL_TABLET | Freq: Once | ORAL | Status: AC
  Administered 2019-12-20: 650 mg via ORAL

## 2019-12-20 MED ORDER — VANCOMYCIN HCL 1000 MG IV SOLR
INTRAVENOUS | Status: AC
  Filled 2019-12-20: qty 1000

## 2019-12-20 MED ORDER — BUPIVACAINE LIPOSOME 1.3 % IJ SUSP
INTRAMUSCULAR | Status: DC | PRN
  Administered 2019-12-20: 10 mL via PERINEURAL

## 2019-12-20 MED ORDER — ROCURONIUM BROMIDE 10 MG/ML (PF) SYRINGE
PREFILLED_SYRINGE | INTRAVENOUS | Status: DC | PRN
  Administered 2019-12-20: 50 mg via INTRAVENOUS

## 2019-12-20 MED ORDER — SUCCINYLCHOLINE CHLORIDE 200 MG/10ML IV SOSY
PREFILLED_SYRINGE | INTRAVENOUS | Status: DC | PRN
  Administered 2019-12-20: 80 mg via INTRAVENOUS

## 2019-12-20 MED ORDER — LACTATED RINGERS IV SOLN: INTRAVENOUS | Status: DC

## 2019-12-20 MED ORDER — TRAMADOL HCL 50 MG PO TABS: 50.0000 mg | ORAL_TABLET | Freq: Four times a day (QID) | ORAL | 0 refills | Status: DC | PRN

## 2019-12-20 MED ORDER — OXYCODONE HCL 5 MG PO TABS: 5.0000 mg | ORAL_TABLET | Freq: Once | ORAL | Status: DC | PRN

## 2019-12-20 MED ORDER — OXYCODONE HCL 5 MG/5ML PO SOLN: 5.0000 mg | Freq: Once | ORAL | Status: DC | PRN

## 2019-12-20 MED ORDER — ACETAMINOPHEN 325 MG PO TABS
ORAL_TABLET | ORAL | Status: AC
  Filled 2019-12-20: qty 2

## 2019-12-20 MED ORDER — ORAL CARE MOUTH RINSE: 15.0000 mL | Freq: Once | OROMUCOSAL | Status: AC

## 2019-12-20 MED ORDER — SUGAMMADEX SODIUM 200 MG/2ML IV SOLN
INTRAVENOUS | Status: DC | PRN
  Administered 2019-12-20: 120 mg via INTRAVENOUS

## 2019-12-20 MED ORDER — ONDANSETRON HCL 4 MG/2ML IJ SOLN
INTRAMUSCULAR | Status: DC | PRN
  Administered 2019-12-20: 4 mg via INTRAVENOUS

## 2019-12-20 MED ORDER — PROPOFOL 10 MG/ML IV BOLUS
INTRAVENOUS | Status: AC
  Filled 2019-12-20: qty 20

## 2019-12-20 MED ORDER — POVIDONE-IODINE 10 % EX SWAB: 2.0000 "application " | Freq: Once | CUTANEOUS | Status: DC

## 2019-12-20 MED ORDER — LIDOCAINE 2% (20 MG/ML) 5 ML SYRINGE
INTRAMUSCULAR | Status: AC
  Filled 2019-12-20: qty 5

## 2019-12-20 MED ORDER — BUPIVACAINE-EPINEPHRINE (PF) 0.5% -1:200000 IJ SOLN
INTRAMUSCULAR | Status: DC | PRN
  Administered 2019-12-20: 20 mL via PERINEURAL

## 2019-12-20 SURGICAL SUPPLY — 85 items
BIT DRILL CALIBRATED 2.7 (BIT) ×2 IMPLANT
BLADE AVERAGE 25X9 (BLADE) IMPLANT
BNDG COHESIVE 4X5 TAN STRL (GAUZE/BANDAGES/DRESSINGS) ×2 IMPLANT
BNDG ELASTIC 3X5.8 VLCR STR LF (GAUZE/BANDAGES/DRESSINGS) ×2 IMPLANT
BNDG ELASTIC 4X5.8 VLCR STR LF (GAUZE/BANDAGES/DRESSINGS) ×2 IMPLANT
BNDG ESMARK 4X9 LF (GAUZE/BANDAGES/DRESSINGS) IMPLANT
BNDG GAUZE ELAST 4 BULKY (GAUZE/BANDAGES/DRESSINGS) ×2 IMPLANT
BRUSH SCRUB EZ PLAIN DRY (MISCELLANEOUS) ×4 IMPLANT
CLEANER TIP ELECTROSURG 2X2 (MISCELLANEOUS) ×2 IMPLANT
CORD BIPOLAR FORCEPS 12FT (ELECTRODE) ×2 IMPLANT
COVER SURGICAL LIGHT HANDLE (MISCELLANEOUS) ×2 IMPLANT
COVER WAND RF STERILE (DRAPES) ×2 IMPLANT
DRAIN PENROSE 1/4X12 LTX STRL (WOUND CARE) IMPLANT
DRAPE C-ARM 42X72 X-RAY (DRAPES) ×2 IMPLANT
DRAPE C-ARMOR (DRAPES) IMPLANT
DRAPE HALF SHEET 40X57 (DRAPES) ×2 IMPLANT
DRAPE INCISE IOBAN 66X45 STRL (DRAPES) IMPLANT
DRAPE ORTHO SPLIT 77X108 STRL (DRAPES) ×1
DRAPE SURG ORHT 6 SPLT 77X108 (DRAPES) ×1 IMPLANT
DRAPE U-SHAPE 47X51 STRL (DRAPES) ×2 IMPLANT
DRSG ADAPTIC 3X8 NADH LF (GAUZE/BANDAGES/DRESSINGS) ×2 IMPLANT
DRSG MEPITEL 4X7.2 (GAUZE/BANDAGES/DRESSINGS) ×2 IMPLANT
DRSG PAD ABDOMINAL 8X10 ST (GAUZE/BANDAGES/DRESSINGS) ×2 IMPLANT
ELECT REM PT RETURN 9FT ADLT (ELECTROSURGICAL) ×2
ELECTRODE REM PT RTRN 9FT ADLT (ELECTROSURGICAL) ×1 IMPLANT
EVACUATOR 1/8 PVC DRAIN (DRAIN) IMPLANT
GAUZE SPONGE 4X4 12PLY STRL (GAUZE/BANDAGES/DRESSINGS) ×2 IMPLANT
GLOVE BIO SURGEON STRL SZ 6.5 (GLOVE) ×6 IMPLANT
GLOVE BIO SURGEON STRL SZ7.5 (GLOVE) ×6 IMPLANT
GLOVE BIOGEL PI IND STRL 6.5 (GLOVE) ×1 IMPLANT
GLOVE BIOGEL PI IND STRL 7.5 (GLOVE) ×1 IMPLANT
GLOVE BIOGEL PI INDICATOR 6.5 (GLOVE) ×1
GLOVE BIOGEL PI INDICATOR 7.5 (GLOVE) ×1
GOWN STRL REUS W/ TWL LRG LVL3 (GOWN DISPOSABLE) ×3 IMPLANT
GOWN STRL REUS W/ TWL XL LVL3 (GOWN DISPOSABLE) ×1 IMPLANT
GOWN STRL REUS W/TWL LRG LVL3 (GOWN DISPOSABLE) ×3
GOWN STRL REUS W/TWL XL LVL3 (GOWN DISPOSABLE) ×1
K-WIRE FIXATION 2.0X6 (WIRE) ×6
KIT BASIN OR (CUSTOM PROCEDURE TRAY) ×2 IMPLANT
KIT TURNOVER KIT B (KITS) ×2 IMPLANT
KWIRE FIXATION 2.0X6 (WIRE) ×3 IMPLANT
LOOP VESSEL MAXI BLUE (MISCELLANEOUS) ×2 IMPLANT
MANIFOLD NEPTUNE II (INSTRUMENTS) ×2 IMPLANT
NEEDLE HYPO 25X1 1.5 SAFETY (NEEDLE) IMPLANT
NS IRRIG 1000ML POUR BTL (IV SOLUTION) ×2 IMPLANT
PACK ORTHO EXTREMITY (CUSTOM PROCEDURE TRAY) ×2 IMPLANT
PAD ARMBOARD 7.5X6 YLW CONV (MISCELLANEOUS) ×4 IMPLANT
PAD CAST 3X4 CTTN HI CHSV (CAST SUPPLIES) ×1 IMPLANT
PAD CAST 4YDX4 CTTN HI CHSV (CAST SUPPLIES) ×1 IMPLANT
PADDING CAST COTTON 3X4 STRL (CAST SUPPLIES) ×1
PADDING CAST COTTON 4X4 STRL (CAST SUPPLIES) ×1
PLATE LOCK RT SM (Plate) ×2 IMPLANT
PLATE LOCK RT SM 74X10.7X3.5X9 (Plate) ×1 IMPLANT
PLATE LOCK RT SM 88X10.9X2.5X9 (Plate) ×1 IMPLANT
SCREW LOCK CORT STAR 3.5X14 (Screw) ×4 IMPLANT
SCREW LOCK CORT STAR 3.5X16 (Screw) ×6 IMPLANT
SCREW LOCK CORT STAR 3.5X18 (Screw) ×4 IMPLANT
SCREW LOCK CORT STAR 3.5X20 (Screw) ×2 IMPLANT
SCREW LOCK CORT STAR 3.5X24 (Screw) ×2 IMPLANT
SCREW LOCK CORT STAR 3.5X30 (Screw) ×2 IMPLANT
SCREW LOW PROFILE 3.5X30MM TIS (Screw) ×2 IMPLANT
SCREW LP NL T15 3.5X20 (Screw) ×4 IMPLANT
SCREW T15 MD 3.5X26MM NS (Screw) ×2 IMPLANT
SCREW T15 MD 3.5X32MM NS (Screw) ×2 IMPLANT
SCREW T15 MD 3.5X46MM NS (Screw) ×2 IMPLANT
SLING ARM FOAM STRAP MED (SOFTGOODS) ×2 IMPLANT
SPONGE LAP 18X18 RF (DISPOSABLE) IMPLANT
STAPLER VISISTAT 35W (STAPLE) IMPLANT
STOCKINETTE IMPERVIOUS 9X36 MD (GAUZE/BANDAGES/DRESSINGS) ×2 IMPLANT
SUCTION FRAZIER HANDLE 10FR (MISCELLANEOUS) ×1
SUCTION TUBE FRAZIER 10FR DISP (MISCELLANEOUS) ×1 IMPLANT
SUT ETHILON 3 0 PS 1 (SUTURE) ×4 IMPLANT
SUT VIC AB 0 CT1 27 (SUTURE) ×2
SUT VIC AB 0 CT1 27XBRD ANBCTR (SUTURE) ×2 IMPLANT
SUT VIC AB 2-0 CT1 27 (SUTURE) ×2
SUT VIC AB 2-0 CT1 TAPERPNT 27 (SUTURE) ×2 IMPLANT
SYR 5ML LL (SYRINGE) IMPLANT
SYR CONTROL 10ML LL (SYRINGE) ×2 IMPLANT
SYR TOOMEY IRRIG 70ML (MISCELLANEOUS) ×4
SYRINGE TOOMEY IRRIG 70ML (MISCELLANEOUS) ×2 IMPLANT
TOWEL GREEN STERILE (TOWEL DISPOSABLE) ×2 IMPLANT
TOWEL GREEN STERILE FF (TOWEL DISPOSABLE) ×2 IMPLANT
TRAY FOLEY MTR SLVR 16FR STAT (SET/KITS/TRAYS/PACK) IMPLANT
WATER STERILE IRR 1000ML POUR (IV SOLUTION) ×2 IMPLANT
YANKAUER SUCT BULB TIP NO VENT (SUCTIONS) IMPLANT

## 2019-12-20 NOTE — Progress Notes (Signed)
Patient states her niece will be staying with her for the next 24 hours.   Gabriel Rainwater, RN

## 2019-12-20 NOTE — Discharge Instructions (Addendum)
Katha Hamming, MD Patrecia Pace PA-C [facility] [place] [phone] [phone] (fax)                                  POST-OPERATIVE INSTRUCTIONS   WEIGHT BEARING STATUS:  Non-weightbearing right upper extremity  RANGE OF MOTION/ACTIVITY:  Wear sling for comfort. Wiggle fingers  WOUND CARE ? Please keep splint clean dry and intact until follow-up. If your splint gets wet for any reason please contact the office immediately.  Do not stick anything down your splint such as pencils, momey, hangers to try and scratch yourself.  If you feel itchy take Benadryl as prescribed on the bottle for itching ? You may shower on Post-Op Day #2.  ? You must keep splint dry during this process and may find that a plastic bag taped around the extremity or alternatively a towel based bath may be a better option.   ? If you get your splint wet or if it is damaged please contact our clinic.  EXERCISES ? Due to your splint being in place you will not be able to bear weight through your extremity.   Please use your sling until follow-up. ? Please continue to work on range of motion of your fingers and stretch these multiple times a day to prevent stiffness. ? Please continue to ambulate and do not stay sitting or lying for too long. Perform foot and wrist pumps to assist in circulation.  DVT/PE prophylaxis: None  DIET: As you were eating previously.  Can use over the counter stool softeners and bowel preparations, such as Miralax, to help with bowel movements.  Narcotics can be constipating.  Be sure to drink plenty of fluids  REGIONAL ANESTHESIA (NERVE BLOCKS)  The anesthesia team may have performed a nerve block for you if safe in the setting of your care.  This is a great tool used to minimize pain.  Typically the block may start wearing off overnight but the long acting medicine may last for 3-4 days.  The nerve block wearing off can be a challenging period  but please utilize your as needed pain medications to try and manage this period.    ICE AND ELEVATE INJURED/OPERATIVE EXTREMITY  Using ice and elevating the injured extremity above your heart can help with swelling and pain control.  Icing in a pulsatile fashion, such as 20 minutes on and 20 minutes off, can be followed.    Do not place ice directly on skin. Make sure there is a barrier between to skin and the ice pack.    Using frozen items such as frozen peas works well as the conform nicely to the are that needs to be iced.  POST-OP MEDICATIONS- Multimodal approach to pain control  In general your pain will be controlled with a combination of substances.  Prescriptions unless otherwise discussed are electronically sent to your pharmacy.  This is a carefully made plan we use to minimize narcotic use.                     -   Acetaminophen - Non-narcotic pain medicine taken on a scheduled basis        -   Oxycodone - This is a strong narcotic, to be used only on an "as needed" basis for pain.                  -  Robaxin -  Muscle relaxer taken on an "as needed" basis for muscle spasms                  -   Gabapentin - Helps with nerve pain, such as burning or tingling feeling in the operative arm             STOP SMOKING OR USING NICOTINE PRODUCTS!!!!  As discussed nicotine severely impairs your body's ability to heal surgical and traumatic wounds but also impairs bone healing.  Wounds and bone heal by forming microscopic blood vessels (angiogenesis) and nicotine is a vasoconstrictor (essentially, shrinks blood vessels).  Therefore, if vasoconstriction occurs to these microscopic blood vessels they essentially disappear and are unable to deliver necessary nutrients to the healing tissue.  This is one modifiable factor that you can do to dramatically increase your chances of healing your injury.    (This means no smoking, no nicotine gum, patches, etc)   FOLLOW-UP ? If you develop a Fever  (>101.5), Redness or Drainage from the surgical incision site, please call our office to arrange for an evaluation. ? Please call the office to schedule a follow-up appointment for your incision check if you do not already have one, 7-10 days post-operatively.  CALL THE OFFICE WITH ANY QUESTIONS OR CONCERNS: [phone]   VISIT OUR WEBSITE FOR ADDITIONAL INFORMATION: [email or web address]    OTHER HELPFUL INFORMATION   If you had a block, it will wear off between 8-24 hrs postop typically.  This is period when your pain may go from nearly zero to the pain you would have had postop without the block.  This is an abrupt transition but nothing dangerous is happening.  You may take an extra dose of narcotic when this happens.   You may be more comfortable sleeping in a semi-seated position the first few nights following surgery.  Keep a pillow propped under the elbow and forearm for comfort.  If you have a recliner type of chair it might be beneficial.  If not that is fine too, but it would be helpful to sleep propped up with pillows behind your operated shoulder as well under your elbow and forearm.  This will reduce pulling on the suture lines.   When dressing, put your operative arm in the sleeve first.  When getting undressed, take your operative arm out last.  Loose fitting, button-down shirts are recommended.  Often in the first days after surgery you may be more comfortable keeping your operative arm under your shirt and not through the sleeve.   You may return to work/school in the next couple of days when you feel up to it.  Desk work and typing in the sling is fine.   We suggest you use the pain medication the first night prior to going to bed, in order to ease any pain when the anesthesia wears off. You should avoid taking pain medications on an empty stomach as it will make you nauseous.  You should wean off your narcotic medicines as soon as you are able.  Most patients will be off  or using minimal narcotics before their first postop appointment.    Do not drink alcoholic beverages or take illicit drugs when taking pain medications.   It is against the law to drive while taking narcotics.  In some states it is against the law to drive while your arm is in a sling.    Pain medication may make you constipated.  Below are a few  solutions to try in this order:  Decrease the amount of pain medication if you aren't having pain.  Drink lots of decaffeinated fluids.  Drink prune juice and/or each dried prunes   If the first 3 don't work start with additional solutions -   Take Colace - an over-the-counter stool softener -   Take Senokot - an over-the-counter laxative -   Take Miralax - a stronger over-the-counter laxative

## 2019-12-20 NOTE — Anesthesia Procedure Notes (Signed)
Procedure Name: Intubation Date/Time: 12/20/2019 12:07 PM Performed by: Lance Coon, CRNA Pre-anesthesia Checklist: Patient identified, Emergency Drugs available, Suction available, Patient being monitored and Timeout performed Patient Re-evaluated:Patient Re-evaluated prior to induction Oxygen Delivery Method: Circle system utilized Preoxygenation: Pre-oxygenation with 100% oxygen Induction Type: IV induction Ventilation: Mask ventilation without difficulty Laryngoscope Size: Glidescope and 4 Grade View: Grade I Tube type: Oral Tube size: 7.0 mm Number of attempts: 1 Airway Equipment and Method: Stylet and Video-laryngoscopy Placement Confirmation: ETT inserted through vocal cords under direct vision,  positive ETCO2 and breath sounds checked- equal and bilateral Secured at: 20 cm Tube secured with: Tape Dental Injury: Teeth and Oropharynx as per pre-operative assessment

## 2019-12-20 NOTE — Anesthesia Postprocedure Evaluation (Signed)
Anesthesia Post Note  Patient: [name]  Procedure(s) Performed: OPEN REDUCTION INTERNAL FIXATION (ORIF) DISTAL HUMERUS FRACTURE (Right Elbow)     Patient location during evaluation: PACU Anesthesia Type: General Level of consciousness: awake and alert and oriented Pain management: pain level controlled Vital Signs Assessment: post-procedure vital signs reviewed and stable Respiratory status: spontaneous breathing, nonlabored ventilation and respiratory function stable Cardiovascular status: blood pressure returned to baseline Postop Assessment: no apparent nausea or vomiting Anesthetic complications: no   No complications documented.  Last Vitals:  Vitals:   12/20/19 1450 12/20/19 1505  BP: (!) 105/49 (!) 119/53  Pulse: 66 68  Resp: (!) 21 (!) 21  Temp:  (!) 36.2 C  SpO2: 100% 100%    Last Pain:  Vitals:   12/20/19 1505  TempSrc:   PainSc: 0-No pain                 Brennan Bailey

## 2019-12-20 NOTE — Transfer of Care (Signed)
Immediate Anesthesia Transfer of Care Note  Patient: [name]  Procedure(s) Performed: OPEN REDUCTION INTERNAL FIXATION (ORIF) DISTAL HUMERUS FRACTURE (Right Elbow)  Patient Location: PACU  Anesthesia Type:GA combined with regional for post-op pain  Level of Consciousness: drowsy and patient cooperative  Airway & Oxygen Therapy: Patient Spontanous Breathing  Post-op Assessment: Report given to RN and Post -op Vital signs reviewed and stable  Post vital signs: Reviewed and stable  Last Vitals:  Vitals Value Taken Time  BP 117/42 12/20/19 1419  Temp    Pulse 71 12/20/19 1420  Resp 21 12/20/19 1420  SpO2 91 % 12/20/19 1420  Vitals shown include unvalidated device data.  Last Pain:  Vitals:   12/20/19 0905  TempSrc:   PainSc: 0-No pain         Complications: No complications documented.

## 2019-12-20 NOTE — Anesthesia Procedure Notes (Signed)
Anesthesia Regional Block: Supraclavicular block   Pre-Anesthetic Checklist: ,, timeout performed, Correct Patient, Correct Site, Correct Laterality, Correct Procedure, Correct Position, site marked, Risks and benefits discussed, pre-op evaluation,  At surgeon's request and post-op pain management  Laterality: Right  Prep: Maximum Sterile Barrier Precautions used, chloraprep       Needles:  Injection technique: Single-shot  Needle Type: Echogenic Stimulator Needle     Needle Length: 9cm  Needle Gauge: 22     Additional Needles:   Procedures:,,,, ultrasound used (permanent image in chart),,,,  Narrative:  Start time: 12/20/2019 11:13 AM End time: 12/20/2019 11:16 AM Injection made incrementally with aspirations every 5 mL.  Performed by: Personally  Anesthesiologist: Brennan Bailey, MD  Additional Notes: Risks, benefits, and alternative discussed. Patient gave consent for procedure. Patient prepped and draped in sterile fashion. Sedation administered, patient remains easily responsive to voice. Relevant anatomy identified with ultrasound guidance. Local anesthetic given in 5cc increments with no signs or symptoms of intravascular injection. No pain or paraesthesias with injection. Patient monitored throughout procedure with signs of LAST or immediate complications. Tolerated well. Ultrasound image placed in chart.  Tawny Asal, MD

## 2019-12-20 NOTE — Op Note (Signed)
Orthopaedic Surgery Operative Note (CSN: [id number] ) Date of Surgery: 12/20/2019  Admit Date: 12/20/2019   Diagnoses: Pre-Op Diagnoses: Right supracondylar humerus fracture   Post-Op Diagnosis: Same  Procedures: CPT 84536-IWOE reduction internal fixation of right distal humerus  Surgeons : Primary: Shona Needles, MD  Assistant: Patrecia Pace, PA-C  Location: OR 3   Anesthesia:General  Antibiotics: Ancef 2g preop   Tourniquet time:None used  Estimated Blood HOZY:248 mL  Complications:None   Specimens:None   Implants: Implant Name Type Inv. Item Serial No. Manufacturer Lot No. LRB No. Used Action  PLATE LOCK RT SM - [id number] Plate PLATE LOCK RT SM  [employer](ORTH,TRAU,BIO,SG)  Right 1 Implanted  SCREW LP NL T15 3.5X20 - [id number] Screw SCREW LP NL T15 3.5X20  [employer](ORTH,TRAU,BIO,SG)  Right 2 Implanted  SCREW LOCK CORT STAR 3.5X16 - [id number] Screw SCREW LOCK CORT STAR 3.5X16  [employer](ORTH,TRAU,BIO,SG)  Right 3 Implanted  SCREW LOCK CORT STAR 3.5X18 - [id number] Screw SCREW LOCK CORT STAR 3.5X18  [employer](ORTH,TRAU,BIO,SG)  Right 2 Implanted  PLATE LOCK RT SM - [id number] Plate PLATE LOCK RT SM  [employer](ORTH,TRAU,BIO,SG)  Right 1 Implanted  SCREW LOCK CORT STAR 3.5X14 - [id number] Screw SCREW LOCK CORT STAR 3.5X14  [employer](ORTH,TRAU,BIO,SG)  Right 1 Implanted  SCREW LOW PROFILE 3.5X30MM TIS - [id number] Screw SCREW LOW PROFILE 3.5X30MM TIS  [employer](ORTH,TRAU,BIO,SG)  Right 1 Implanted  SCREW LOCK CORT STAR 3.5X30 - [id number] Screw SCREW LOCK CORT STAR 3.5X30  [employer](ORTH,TRAU,BIO,SG)  Right 1 Implanted  SCREW LOCK CORT STAR 3.5X20 - [id number] Screw SCREW LOCK CORT STAR 3.5X20  [employer](ORTH,TRAU,BIO,SG)  Right 1 Implanted  SCREW T15 MD 3.5X26MM NS - [id number] Screw SCREW T15 MD 3.5X26MM NS  [employer](ORTH,TRAU,BIO,SG)  Right 1 Implanted  SCREW T15 MD 3.5X32MM NS - [id number] Screw SCREW T15 MD 3.5X32MM NS  [employer]  RECON(ORTH,TRAU,BIO,SG)  Right 1 Implanted  SCREW T15 MD 3.5X46MM NS - [id number] Screw SCREW T15 MD 3.5X46MM NS  [employer](ORTH,TRAU,BIO,SG)  Right 1 Implanted     Indications for Surgery: 74 year old female who sustained a ground-level fall and a right supracondylar distal humerus fracture.  She was referred to me for the complex nature of her fracture.  I discussed at length risks and benefits of surgical versus nonsurgical intervention.  I discussed the possibility of total elbow arthroplasty versus open reduction internal fixation.  We discussed the weight limit restriction with a total elbow arthroplasty as well as the risks associated with both procedures.  After full discussion we decided to pursue open reduction internal fixation.  Risks and benefits were discussed with the patient.  Risks included but not limited to bleeding, infection, malunion, nonunion, hardware failure, hardware irritation, nerve and blood vessel injury, elbow stiffness, posttraumatic arthritis, even the possibility of need for a total elbow arthroplasty in the future.  The patient agreed to proceed with surgery and consent was obtained.  Operative Findings: Open reduction internal fixation of right distal supracondylar humerus fracture with intra-articular extension into the capitellum treated with posterior lateral plate and a direct medial plate using [employer] ALPS distal humerus.  Procedure: The patient was identified in the preoperative holding area. Consent was confirmed with the patient and their family and all questions were answered. The operative extremity was marked after confirmation with the patient. she was then brought back to the operating room by our anesthesia colleagues.  She was carefully transferred over to a radiolucent flat top table.  She was placed under general anesthetic.  She was carefully positioned in lateral decubitus position with a beanbag.  An axillary roll was placed to keep pressure  off of her neurovascular structures.  The right upper extremity was then prepped and draped in usual sterile fashion.  A timeout was performed to verify the patient, the procedure, and the extremity.  Preoperative antibiotics were dosed.  Fluoroscopic imaging was obtained to show the unstable nature of her injury.  A direct posterior approach was carried down through skin and subcutaneous tissue.  I mobilized skin flaps medially and laterally and exposed the triceps.  Laterally I developed the interval between the triceps and the intermuscular septum and exposed the distal humerus down to the capitellum.  I exposed the olecranon fossa.  Medially I then dissected out the ulnar nerve and protected this throughout the case.  I then exposed the distal humerus.  I tried to keep the soft tissue attachment to the separate medial epicondyle fracture.  Once I had adequate exposure I then proceeded to reduce the epicondyle and the medial condyle to the metaphysis.  I held this provisionally with K wires.  The capitellum was exposed and I used a reduction tenaculum to reduce this back to the metaphysis.  I then provisionally held this with a K wire.  I confirmed adequate reduction with fluoroscopy.  I then used a posterior lateral plate and fixed it to the intact metaphysis and with a nonlocking screw.  I then confirmed placement and then in situ contoured the screw holes and I placed locking screws into the capitellum to grasp the fragment.  I placed another nonlocking screw into the humeral shaft to complete the lateral side fixation.  I then chose a medial plate.  I held it provisionally with a K wire once I confirmed positioning.  I then placed a nonlocking screw to bring the plate flush to bone.  I then contoured the end of the plate and placed a locking screws to across the fracture into the epicondyle and then into the jugular.  I confirmed positioning of the screws with fluoroscopy.  I then placed nonlocking  screws into the humeral shaft to complete the construct.  Final fluoroscopic imaging was obtained.  The incision was copiously irrigated.  A gram of vancomycin powder was placed into the incision.  A layer closure of 0 Vicryl, 2-0 Vicryl and 3-0 nylon was used to close the skin.  Sterile dressing consisting of Mepitel, 4 x 4's, sterile cast padding and a well-padded long-arm splint was placed.  The patient was awoken from anesthesia and taken to the PACU in stable condition.  Post Op Plan/Instructions: Patient will be nonweightbearing to the right upper extremity.  She will return in 1 week for repeat x-rays and transition out of her splint.  No DVT prophylaxis needed in this upper extremity patient.  She will be discharged home from the PACU.  I was present and performed the entire surgery.  Patrecia Pace, PA-C did assist me throughout the case. An assistant was necessary given the difficulty in approach, maintenance of reduction and ability to instrument the fracture.   Katha Hamming, MD [facility]

## 2019-12-20 NOTE — Anesthesia Preprocedure Evaluation (Addendum)
Anesthesia Evaluation  Patient identified by MRN, date of birth, ID band Patient awake    Reviewed: Allergy & Precautions, NPO status , Patient's Chart, lab work & pertinent test results, reviewed documented beta blocker date and time   History of Anesthesia Complications Negative for: history of anesthetic complications  Airway Mallampati: II  TM Distance: >3 FB Neck ROM: Full    Dental no notable dental hx.    Pulmonary former smoker,    Pulmonary exam normal        Cardiovascular hypertension, Pt. on home beta blockers and Pt. on medications + CAD, + Past MI (2011), + Cardiac Stents, + CABG and + Peripheral Vascular Disease  Normal cardiovascular exam     Neuro/Psych Anxiety negative neurological ROS     GI/Hepatic Neg liver ROS, GERD  Medicated and Controlled,  Endo/Other  negative endocrine ROS  Renal/GU negative Renal ROS  negative genitourinary   Musculoskeletal  (+) Arthritis ,   Abdominal   Peds  Hematology negative hematology ROS (+)   Anesthesia Other Findings Day of surgery medications reviewed with patient.  Reproductive/Obstetrics negative OB ROS                            Anesthesia Physical Anesthesia Plan  ASA: III  Anesthesia Plan: Regional and MAC   Post-op Pain Management:    Induction:   PONV Risk Score and Plan: 2 and Treatment may vary due to age or medical condition, Propofol infusion and Ondansetron  Airway Management Planned: Natural Airway and Simple Face Mask  Additional Equipment: None  Intra-op Plan:   Post-operative Plan:   Informed Consent: I have reviewed the patients History and Physical, chart, labs and discussed the procedure including the risks, benefits and alternatives for the proposed anesthesia with the patient or authorized representative who has indicated his/her understanding and acceptance.     Dental advisory given  Plan  Discussed with: CRNA  Anesthesia Plan Comments:        Anesthesia Quick Evaluation

## 2019-12-20 NOTE — Interval H&P Note (Signed)
History and Physical Interval Note:  12/20/2019 10:49 AM  [name]  has presented today for surgery, with the diagnosis of Right distal humerus fracture.  The various methods of treatment have been discussed with the patient and family. After consideration of risks, benefits and other options for treatment, the patient has consented to  Procedure(s): OPEN REDUCTION INTERNAL FIXATION (ORIF) DISTAL HUMERUS FRACTURE (Right) as a surgical intervention.  The patient's history has been reviewed, patient examined, no change in status, stable for surgery.  I have reviewed the patient's chart and labs.  Questions were answered to the patient's satisfaction.     Lennette Bihari P Deylan Canterbury

## 2019-12-21 ENCOUNTER — Encounter (HOSPITAL_COMMUNITY): Payer: Self-pay | Admitting: Student

## 2019-12-29 ENCOUNTER — Ambulatory Visit: Payer: Medicare HMO | Admitting: Family

## 2019-12-29 ENCOUNTER — Telehealth (INDEPENDENT_AMBULATORY_CARE_PROVIDER_SITE_OTHER): Payer: Medicare HMO | Admitting: Family

## 2019-12-29 ENCOUNTER — Telehealth: Payer: Self-pay | Admitting: Family

## 2019-12-29 ENCOUNTER — Encounter: Payer: Self-pay | Admitting: Family

## 2019-12-29 VITALS — Ht 60.0 in | Wt 133.0 lb

## 2019-12-29 DIAGNOSIS — R748 Abnormal levels of other serum enzymes: Secondary | ICD-10-CM

## 2019-12-29 DIAGNOSIS — I10 Essential (primary) hypertension: Secondary | ICD-10-CM

## 2019-12-29 DIAGNOSIS — Z23 Encounter for immunization: Secondary | ICD-10-CM

## 2019-12-29 NOTE — Assessment & Plan Note (Addendum)
No recent labs. Advised of the importance of this. She verbalized understanding and states she will call to schedule labs. Trial stop crestor for a month and repeat labs to see if trending down.  I have asked CMA to call Dr Minna Merritts office to make him aware of concern for norco affecting liver enzymes and perhaps discussion of alternate medications including using less gabapentin, consideration of changing from zoloft to cymbalta. Patient will discuss epidural injections at follow up as well as she was open to this today. Will follow.

## 2019-12-29 NOTE — Progress Notes (Signed)
I have called patient & asked her to hold Crestor for one month to have CMP checked. I have scheduled patient & she will let us know if she needs to push out having lab done due to broken elbow. She is not currently driving.

## 2019-12-29 NOTE — Assessment & Plan Note (Signed)
Stable. Continue lopressor.

## 2019-12-29 NOTE — Telephone Encounter (Signed)
Call   Dr Berle Mull at [facility]   [phone]) [phone]  Pt's liver enzymes have been elevated and I have consulted GI, Dr Catha Gosselin, and we have shared concerning regards elevated LFTs. I advised patient to discuss either decreasing norco or considering other modalities if appropriate She knows she have a follow up with Dr Alfonso Ramus and I advised her to discuss all this with  him

## 2019-12-29 NOTE — Progress Notes (Signed)
Virtual Visit via Video Note  I connected with@  on 12/29/19 at 11:00 AM EDT by a video enabled telemedicine application and verified that I am speaking with the correct person using two identifiers.  Location patient: home Location provider:work Persons participating in the virtual visit: patient, provider  I discussed the limitations of evaluation and management by telemedicine and the availability of in person appointments. The patient expressed understanding and agreed to proceed.   HPI: Follow up for Elevated liver enzymes.   No abdominal pain, weight loss.   Recent fracture of right humerus and surgery with Dr Doreatha Martin 10 days ago. No right arm pain currently. Has been alternating tramadol and Norco TID.    Currently not driving so has been unable to come in for labs.     HTN- compliant with lopressor. No cp, sob, dizziness.   HLD- compliant with crestor  Chronic LBP- Follows with pain management Dr Alfonso Ramus. Prescribed hydrocodone by Dr Alfonso Ramus and has been taking for years. Discussed epidural injection with Dr Alfonso Ramus but had declined at that time.    LDL 42.   ROS: See pertinent positives and negatives per HPI.  Past Medical History:  Diagnosis Date  . Abnormal Pap smear of cervix    Dr. Prentice Docker   . Coronary artery disease    a. 06/2009 MI/PCI to LCX and LAD; b. 01/2011 Occluded stents-->CABG x 3 ([facility]): LIMA->LAD, VG->LCX, VG->RPDA; c. 01/2012 Cath: LM 50, LAD 95ost, 110m, LCX 95ost, 162m, RCA 76m, RPDA small, LIMA->LAD ok, VG->LCX ok, VG->RPDA 100-->Med rx.  . Degenerative disc disease, lumbar    L4/5 noted CT ab/pelvis 05/19/11   . DVT (deep venous thrombosis) ([place])    after childbirth age 42  . GERD (gastroesophageal reflux disease)   . Hyperlipidemia   . Hypertension    under control  . Macular degeneration   . Myocardial infarction ([place]) 2011  . Parathyroid adenoma    right 13 x 8 mm s/p resection   . PVD (peripheral vascular disease) ([place])    a.  01/2010 s/p Aortofemoral bypass.    Past Surgical History:  Procedure Laterality Date  . CARDIAC CATHETERIZATION  2013   [facility]  . CATARACT EXTRACTION    . COLONOSCOPY    . COLPOSCOPY    . CORONARY ARTERY BYPASS GRAFT  02/07/2010   x 3  . ERCP N/A 10/05/2019   Procedure: ENDOSCOPIC RETROGRADE CHOLANGIOPANCREATOGRAPHY (ERCP);  Surgeon: Lucilla Lame, MD;  Location: [facility] ENDOSCOPY;  Service: Endoscopy;  Laterality: N/A;  . FACIAL COSMETIC SURGERY  2013   Dr. Nadeen Landau  . FOOT SURGERY     right  . ILIO-FEMORAL BYPASS GRAFT  2011  . LIVER BIOPSY N/A 10/31/2019   Procedure: LIVER BIOPSY;  Surgeon: Olean Ree, MD;  Location: [facility] ORS;  Service: General;  Laterality: N/A;  . MITRAL VALVE REPAIR  01/2010  . ORIF ANKLE FRACTURE Right 10/08/2016   Procedure: OPEN REDUCTION INTERNAL FIXATION (ORIF) ANKLE FRACTURE;  Surgeon: Corky Mull, MD;  Location: [facility] ORS;  Service: Orthopedics;  Laterality: Right;  . ORIF HUMERUS FRACTURE Right 12/20/2019   Procedure: OPEN REDUCTION INTERNAL FIXATION (ORIF) DISTAL HUMERUS FRACTURE;  Surgeon: Shona Needles, MD;  Location: [facility];  Service: Orthopedics;  Laterality: Right;  . ORIF WRIST FRACTURE Right 12/29/2018   Procedure: OPEN REDUCTION INTERNAL FIXATION (ORIF) WRIST FRACTURE;  Surgeon: Corky Mull, MD;  Location: [facility] ORS;  Service: Orthopedics;  Laterality: Right;  . PARATHYROIDECTOMY    . TUBAL LIGATION  Family History  Problem Relation Age of Onset  . Heart disease Father        Current Outpatient Medications:  .  aspirin 81 MG EC tablet, Take 81 mg by mouth daily.  , Disp: , Rfl:  .  BESIVANCE 0.6 % SUSP, Place 4 drops into the left eye See admin instructions. Instill 4 drop into the left eye on the day of and the day after eye injection, Disp: , Rfl:  .  Cholecalciferol (VITAMIN D3) 1000 UNITS CAPS, Take 1,000 Units by mouth daily. , Disp: , Rfl:  .  Coenzyme Q10 (CO Q10 PO), Take 1 capsule by mouth daily., Disp: , Rfl:  .   cyclobenzaprine (FLEXERIL) 5 MG tablet, Take 1 tablet (5 mg total) by mouth 3 (three) times daily as needed for muscle spasms., Disp: 20 tablet, Rfl: 0 .  doxylamine, Sleep, (SLEEP AID) 25 MG tablet, Take 25 mg by mouth at bedtime as needed. , Disp: , Rfl:  .  ezetimibe (ZETIA) 10 MG tablet, TAKE 1 TABLET BY MOUTH ONCE DAILY (Patient taking differently: Take 10 mg by mouth daily. ), Disp: 90 tablet, Rfl: 3 .  HYDROcodone-acetaminophen (NORCO) 7.5-325 MG tablet, Take 1 tablet by mouth every 6 (six) hours as needed for moderate pain or severe pain., Disp: 30 tablet, Rfl: 0 .  metoprolol tartrate (LOPRESSOR) 25 MG tablet, Take 0.5 tablets (12.5 mg total) by mouth 2 (two) times daily., Disp: 90 tablet, Rfl: 3 .  Multiple Vitamins-Minerals (PRESERVISION AREDS 2 PO), Take 1 capsule by mouth 2 (two) times daily., Disp: , Rfl:  .  pantoprazole (PROTONIX) 40 MG tablet, TAKE 1 TABLET BY MOUTH ONCE DAILY. (Patient taking differently: Take 40 mg by mouth daily. ), Disp: 90 tablet, Rfl: 4 .  Probiotic Product (RISA-BID PROBIOTIC PO), Take 250 mg by mouth daily. , Disp: , Rfl:  .  rosuvastatin (CRESTOR) 40 MG tablet, Take 1 tablet (40 mg total) by mouth at bedtime., Disp: 90 tablet, Rfl: 0 .  sertraline (ZOLOFT) 100 MG tablet, Take 1 tablet (100 mg total) by mouth daily., Disp: 90 tablet, Rfl: 3 .  traMADol (ULTRAM) 50 MG tablet, Take 1 tablet (50 mg total) by mouth every 6 (six) hours as needed for severe pain., Disp: 20 tablet, Rfl: 0  EXAM:  VITALS per patient if applicable: BP Readings from Last 3 Encounters:  12/20/19 (!) 119/53  11/17/19 (!) 124/96  10/31/19 (!) 107/45   Wt Readings from Last 3 Encounters:  12/29/19 133 lb (60.3 kg)  12/20/19 133 lb (60.3 kg)  11/17/19 134 lb (60.8 kg)    GENERAL: alert, oriented, appears well and in no acute distress  HEENT: atraumatic, conjunttiva clear, no obvious abnormalities on inspection of external nose and ears  NECK: normal movements of the head and  neck  LUNGS: on inspection no signs of respiratory distress, breathing rate appears normal, no obvious gross SOB, gasping or wheezing  CV: no obvious cyanosis  MS: moves all visible extremities without noticeable abnormality  PSYCH/NEURO: pleasant and cooperative, no obvious depression or anxiety, speech and thought processing grossly intact  ASSESSMENT AND PLAN:  Discussed the following assessment and plan:  Need for immunization against influenza - Plan: Flu Vaccine QUAD High Dose(Fluad)  Essential hypertension, benign  Elevated liver enzymes Problem List Items Addressed This Visit      Cardiovascular and Mediastinum   Essential hypertension, benign    Stable. Continue lopressor.         Other  Elevated liver enzymes    No recent labs. Advised of the importance of this. She verbalized understanding and states she will call to schedule labs. Trial stop crestor for a month and repeat labs to see if trending down.  I have asked CMA to call Dr Minna Merritts office to make him aware of concern for norco affecting liver enzymes and perhaps discussion of alternate medications including using less gabapentin, consideration of changing from zoloft to cymbalta. Patient will discuss epidural injections at follow up as well as she was open to this today. Will follow.        Other Visit Diagnoses    Need for immunization against influenza    -  Primary   Relevant Orders   Flu Vaccine QUAD High Dose(Fluad) (Completed)       -we discussed possible serious and likely etiologies, options for evaluation and workup, limitations of telemedicine visit vs in person visit, treatment, treatment risks and precautions. Pt prefers to treat via telemedicine empirically rather then risking or undertaking an in person visit at this moment.     I discussed the assessment and treatment plan with the patient. The patient was provided an opportunity to ask questions and all were answered. The patient  agreed with the plan and demonstrated an understanding of the instructions.   The patient was advised to call back or seek an in-person evaluation if the symptoms worsen or if the condition fails to improve as anticipated.   [name], FNP

## 2020-01-01 NOTE — Telephone Encounter (Signed)
I spoke with Laverta Baltimore at [facility] she sent message back to Dr. Alfonso Ramus expressing concerns. I told her we wanted to make sure this was discussed with patient at her f/u.

## 2020-01-04 ENCOUNTER — Encounter: Payer: Self-pay | Admitting: Family

## 2020-01-05 ENCOUNTER — Other Ambulatory Visit: Payer: Self-pay | Admitting: Family

## 2020-01-05 DIAGNOSIS — R748 Abnormal levels of other serum enzymes: Secondary | ICD-10-CM

## 2020-01-11 ENCOUNTER — Telehealth: Payer: Self-pay | Admitting: Cardiovascular Disease

## 2020-01-11 ENCOUNTER — Other Ambulatory Visit: Payer: Self-pay | Admitting: Family

## 2020-01-11 DIAGNOSIS — F411 Generalized anxiety disorder: Secondary | ICD-10-CM

## 2020-01-11 DIAGNOSIS — K219 Gastro-esophageal reflux disease without esophagitis: Secondary | ICD-10-CM

## 2020-01-11 NOTE — Telephone Encounter (Signed)
stop Crestor for now given LFTs been elevated for several months Recheck LFTs in several months time  if improvement in LFTs could restart at lower dose

## 2020-01-11 NOTE — Telephone Encounter (Signed)
Spoke with patient and she reports her PCP wanted to discontinue her crestor due to elevated liver function. She states that she did not want to discontinue without checking with Dr. Rockey Situ. Advised that I will forward to him for review and recommendations and would be in touch based on his review. She was appreciative for the call with no further questions for now.

## 2020-01-11 NOTE — Telephone Encounter (Signed)
Pt c/o medication issue:  1. Name of Medication: crestor   2. How are you currently taking this medication (dosage and times per day)? Advised to dc   3. Are you having a reaction (difficulty breathing--STAT)? no  4. What is your medication issue? pcp advised patient to dc due to liver function patient wants to check with [facility] first

## 2020-01-12 NOTE — Telephone Encounter (Signed)
Spoke with patient and reviewed provider recommendations. She verbalized understanding with no further questions at this time.  

## 2020-01-13 ENCOUNTER — Encounter: Payer: Self-pay | Admitting: Family

## 2020-01-15 ENCOUNTER — Other Ambulatory Visit: Payer: Self-pay

## 2020-01-15 DIAGNOSIS — K219 Gastro-esophageal reflux disease without esophagitis: Secondary | ICD-10-CM

## 2020-01-15 MED ORDER — PANTOPRAZOLE SODIUM 40 MG PO TBEC: 40.0000 mg | DELAYED_RELEASE_TABLET | Freq: Every day | ORAL | 4 refills | Status: DC

## 2020-01-16 ENCOUNTER — Telehealth: Payer: Self-pay

## 2020-01-16 DIAGNOSIS — R748 Abnormal levels of other serum enzymes: Secondary | ICD-10-CM

## 2020-01-16 NOTE — Telephone Encounter (Signed)
-----   Message from Virgel Manifold, MD sent at 01/16/2020  2:02 PM EDT ----- Thanks for keeping me posted. Herb Grays, can you actually order hepatic function panel for her and request her to do it in the next 1-2 weeks. Its been almost three months since her last level ----- Message ----- From: Burnard Hawthorne, FNP Sent: 01/16/2020  12:28 PM EDT To: Virgel Manifold, MD  Dr Bonna Gains,  Just FYI Pain management /orthopedic has changed patient from Hydrocodone to Oxycodone. I am also holding the crestor and will recheck the LFTs in a couple of months.  Will you posted on LFTs..   Hope you are well! Margaret ----- Message ----- From: Virgel Manifold, MD Sent: 10/26/2019   1:56 PM EDT To: Burnard Hawthorne, FNP  Hi Margaret,  This patient's liver enzymes keep going up. She is getting a liver biopsy next week at the time of her cholecystectomy to evaluate for wilsons disease. But in the meantime would you be able to look into holding some of her meds that could be affecting her liver?  She takes Norco TID and with the acetaminophen use daily, maybe an alternative would be better?  Her Zetia, Crestor and Zoloft could all be leading to elevated liver enzymes too, and if we could back off all those, and then reintroduce one at a time once liver enzymes are better that may help.  Let me know what you think.   Thanks  VT

## 2020-01-16 NOTE — Telephone Encounter (Signed)
Called patient to let her know what her recommendation was from Dr. Bonna Gains and she stated that she had fallen and hurt her elbow, therefore, she was not allowed to drive for 2 months. She stated that she had 6 more weeks to go. I asked her if she had someone to do her the favor to take her to a lab to have it drawn and she stated that she did not. I told her that I would be placing the blood order and to let me know when she would go so I could keep an eye on her results. Patient agreed.

## 2020-01-24 ENCOUNTER — Encounter: Payer: Self-pay | Admitting: Family

## 2020-01-29 ENCOUNTER — Other Ambulatory Visit: Payer: Medicare HMO

## 2020-02-01 ENCOUNTER — Other Ambulatory Visit: Payer: Self-pay

## 2020-02-01 ENCOUNTER — Encounter (INDEPENDENT_AMBULATORY_CARE_PROVIDER_SITE_OTHER): Payer: Medicare HMO | Admitting: Ophthalmology

## 2020-02-01 DIAGNOSIS — H35372 Puckering of macula, left eye: Secondary | ICD-10-CM

## 2020-02-01 DIAGNOSIS — H35033 Hypertensive retinopathy, bilateral: Secondary | ICD-10-CM | POA: Diagnosis not present

## 2020-02-01 DIAGNOSIS — H43813 Vitreous degeneration, bilateral: Secondary | ICD-10-CM

## 2020-02-01 DIAGNOSIS — I10 Essential (primary) hypertension: Secondary | ICD-10-CM | POA: Diagnosis not present

## 2020-02-01 DIAGNOSIS — H353112 Nonexudative age-related macular degeneration, right eye, intermediate dry stage: Secondary | ICD-10-CM

## 2020-02-01 DIAGNOSIS — H353221 Exudative age-related macular degeneration, left eye, with active choroidal neovascularization: Secondary | ICD-10-CM | POA: Diagnosis not present

## 2020-02-05 NOTE — Telephone Encounter (Signed)
Looked into patient's chart and I do not see that the patient had gone to [facility] and have her labs drawn.

## 2020-02-11 ENCOUNTER — Encounter: Payer: Self-pay | Admitting: Family

## 2020-02-12 ENCOUNTER — Other Ambulatory Visit (INDEPENDENT_AMBULATORY_CARE_PROVIDER_SITE_OTHER): Payer: Medicare HMO

## 2020-02-12 ENCOUNTER — Other Ambulatory Visit: Payer: Self-pay

## 2020-02-12 ENCOUNTER — Ambulatory Visit: Payer: Self-pay

## 2020-02-12 DIAGNOSIS — R748 Abnormal levels of other serum enzymes: Secondary | ICD-10-CM

## 2020-02-12 NOTE — Telephone Encounter (Signed)
Patient called stating that she was going to her PCP's office today and have blood drawn. She wanted to know if we could send/fax our lab requisition to them so she could have it drawn at the same time. I told her that it showed in the system but I would also fax it so they could have it. Patient agreed.

## 2020-02-13 LAB — COMPREHENSIVE METABOLIC PANEL
ALT: 23 U/L (ref 0–35)
AST: 28 U/L (ref 0–37)
Albumin: 4 g/dL (ref 3.5–5.2)
Alkaline Phosphatase: 101 U/L (ref 39–117)
BUN: 13 mg/dL (ref 6–23)
CO2: 24 mEq/L (ref 19–32)
Calcium: 9.4 mg/dL (ref 8.4–10.5)
Chloride: 101 mEq/L (ref 96–112)
Creatinine, Ser: 0.73 mg/dL (ref 0.40–1.20)
GFR: 82.38 mL/min (ref >60.00)
Glucose, Bld: 107 mg/dL — ABNORMAL HIGH (ref 70–99)
Potassium: 4.3 mEq/L (ref 3.5–5.1)
Sodium: 132 mEq/L — ABNORMAL LOW (ref 135–145)
Total Bilirubin: 0.3 mg/dL (ref 0.2–1.2)
Total Protein: 5.8 g/dL — ABNORMAL LOW (ref 6.0–8.3)

## 2020-02-14 ENCOUNTER — Other Ambulatory Visit: Payer: Self-pay | Admitting: Family

## 2020-02-14 DIAGNOSIS — E871 Hypo-osmolality and hyponatremia: Secondary | ICD-10-CM

## 2020-02-21 ENCOUNTER — Other Ambulatory Visit: Payer: Self-pay

## 2020-02-21 ENCOUNTER — Other Ambulatory Visit (INDEPENDENT_AMBULATORY_CARE_PROVIDER_SITE_OTHER): Payer: Medicare HMO

## 2020-02-21 DIAGNOSIS — R748 Abnormal levels of other serum enzymes: Secondary | ICD-10-CM

## 2020-02-21 DIAGNOSIS — E871 Hypo-osmolality and hyponatremia: Secondary | ICD-10-CM

## 2020-02-22 ENCOUNTER — Encounter: Payer: Self-pay | Admitting: Family

## 2020-02-22 LAB — BASIC METABOLIC PANEL
BUN: 22 mg/dL (ref 6–23)
CO2: 23 mEq/L (ref 19–32)
Calcium: 9.3 mg/dL (ref 8.4–10.5)
Chloride: 102 mEq/L (ref 96–112)
Creatinine, Ser: 0.82 mg/dL (ref 0.40–1.20)
GFR: 71.64 mL/min (ref >60.00)
Glucose, Bld: 113 mg/dL — ABNORMAL HIGH (ref 70–99)
Potassium: 4.4 mEq/L (ref 3.5–5.1)
Sodium: 135 mEq/L (ref 135–145)

## 2020-02-22 LAB — HEPATIC FUNCTION PANEL
ALT: 17 U/L (ref 0–35)
AST: 22 U/L (ref 0–37)
Albumin: 4 g/dL (ref 3.5–5.2)
Alkaline Phosphatase: 95 U/L (ref 39–117)
Bilirubin, Direct: 0.1 mg/dL (ref 0.0–0.3)
Total Bilirubin: 0.4 mg/dL (ref 0.2–1.2)
Total Protein: 5.9 g/dL — ABNORMAL LOW (ref 6.0–8.3)

## 2020-02-27 ENCOUNTER — Other Ambulatory Visit: Payer: Self-pay

## 2020-02-27 ENCOUNTER — Telehealth: Payer: Self-pay | Admitting: Family

## 2020-02-27 DIAGNOSIS — Z13820 Encounter for screening for osteoporosis: Secondary | ICD-10-CM

## 2020-02-27 NOTE — Telephone Encounter (Signed)
[employer] called  Pt is due for a Dexa scan and they need the patient to be contact when order is placed

## 2020-02-27 NOTE — Telephone Encounter (Signed)
I left detailed message with patient stating that DEXA had been ordered per her insurance request. I gave her number to [place] to to call to have scheduled. Advised if any further questions she call back our office.

## 2020-03-01 ENCOUNTER — Ambulatory Visit (INDEPENDENT_AMBULATORY_CARE_PROVIDER_SITE_OTHER): Payer: Medicare HMO

## 2020-03-01 VITALS — Ht 60.0 in | Wt 133.0 lb

## 2020-03-01 DIAGNOSIS — Z0000 Encounter for general adult medical examination without abnormal findings: Secondary | ICD-10-CM | POA: Diagnosis not present

## 2020-03-01 NOTE — Progress Notes (Addendum)
Subjective:   [name] is a 74 y.o. female who presents for Medicare Annual (Subsequent) preventive examination.  Review of Systems    No ROS.  Medicare Wellness Virtual Visit.   Cardiac Risk Factors include: advanced age (>76men, >46 women);hypertension     Objective:    Today's Vitals   03/01/20 1233  Weight: 133 lb (60.3 kg)  Height: 5' (1.524 m)   Body mass index is 25.97 kg/m.  Advanced Directives 03/01/2020 10/31/2019 10/18/2019 10/05/2019 03/01/2019 12/18/2018 10/30/2016  Does Patient Have a Medical Advance Directive? No No No No Yes No No  Type of Advance Directive - - - - [place] - -  Does patient want to make changes to medical advance directive? Yes (MAU/Ambulatory/Procedural Areas - Information given) - - - No - Patient declined - -  Copy of [place] in Chart? - - - - No - copy requested - -  Would patient like information on creating a medical advance directive? - No - Patient declined No - Patient declined - - - -    Current Medications (verified) Outpatient Encounter Medications as of 03/01/2020  Medication Sig  . aspirin 81 MG EC tablet Take 81 mg by mouth daily.    Marland Kitchen BESIVANCE 0.6 % SUSP Place 4 drops into the left eye See admin instructions. Instill 4 drop into the left eye on the day of and the day after eye injection  . Cholecalciferol (VITAMIN D3) 1000 UNITS CAPS Take 1,000 Units by mouth daily.   . Coenzyme Q10 (CO Q10 PO) Take 1 capsule by mouth daily.  . cyclobenzaprine (FLEXERIL) 5 MG tablet Take 1 tablet (5 mg total) by mouth 3 (three) times daily as needed for muscle spasms.  Marland Kitchen doxylamine, Sleep, (SLEEP AID) 25 MG tablet Take 25 mg by mouth at bedtime as needed.   . ezetimibe (ZETIA) 10 MG tablet TAKE 1 TABLET BY MOUTH ONCE DAILY (Patient taking differently: Take 10 mg by mouth daily. )  . HYDROcodone-acetaminophen (NORCO) 7.5-325 MG tablet Take 1 tablet by mouth every 6 (six) hours as needed for moderate pain  or severe pain.  . metoprolol tartrate (LOPRESSOR) 25 MG tablet Take 0.5 tablets (12.5 mg total) by mouth 2 (two) times daily.  . Multiple Vitamins-Minerals (PRESERVISION AREDS 2 PO) Take 1 capsule by mouth 2 (two) times daily.  . pantoprazole (PROTONIX) 40 MG tablet Take 1 tablet (40 mg total) by mouth daily.  . Probiotic Product (RISA-BID PROBIOTIC PO) Take 250 mg by mouth daily.   . rosuvastatin (CRESTOR) 40 MG tablet Take 1 tablet (40 mg total) by mouth at bedtime.  . sertraline (ZOLOFT) 100 MG tablet TAKE 1 TABLET BY MOUTH ONCE DAILY  . traMADol (ULTRAM) 50 MG tablet Take 1 tablet (50 mg total) by mouth every 6 (six) hours as needed for severe pain.   No facility-administered encounter medications on file as of 03/01/2020.    Allergies (verified) Patient has no known allergies.   History: Past Medical History:  Diagnosis Date  . Abnormal Pap smear of cervix    Dr. Prentice Docker   . Coronary artery disease    a. 06/2009 MI/PCI to LCX and LAD; b. 01/2011 Occluded stents-->CABG x 3 ([facility]): LIMA->LAD, VG->LCX, VG->RPDA; c. 01/2012 Cath: LM 50, LAD 95ost, 137m, LCX 95ost, 169m, RCA 64m, RPDA small, LIMA->LAD ok, VG->LCX ok, VG->RPDA 100-->Med rx.  . Degenerative disc disease, lumbar    L4/5 noted CT ab/pelvis 05/19/11   .  DVT (deep venous thrombosis) ([place])    after childbirth age 73  . GERD (gastroesophageal reflux disease)   . Hyperlipidemia   . Hypertension    under control  . Macular degeneration   . Myocardial infarction ([place]) 2011  . Parathyroid adenoma    right 13 x 8 mm s/p resection   . PVD (peripheral vascular disease) ([place])    a. 01/2010 s/p Aortofemoral bypass.   Past Surgical History:  Procedure Laterality Date  . CARDIAC CATHETERIZATION  2013   [facility]  . CATARACT EXTRACTION    . COLONOSCOPY    . COLPOSCOPY    . CORONARY ARTERY BYPASS GRAFT  02/07/2010   x 3  . ERCP N/A 10/05/2019   Procedure: ENDOSCOPIC RETROGRADE CHOLANGIOPANCREATOGRAPHY (ERCP);  Surgeon:  Lucilla Lame, MD;  Location[facility]COPY;  Service: Endoscopy;  Laterality: N/A;  . FACIAL COSMETIC SURGERY  2013   Dr. Nadeen Landau  . FOOT SURGERY     right  . ILIO-FEMORAL BYPASS GRAFT  2011  . LIVER BIOPSY N/A 10/31/2019   Procedure: LIVER BIOPSY;  Surgeon: Olean Ree, MD;  Location: [facility] ORS;  Service: General;  Laterality: N/A;  . MITRAL VALVE REPAIR  01/2010  . ORIF ANKLE FRACTURE Right 10/08/2016   Procedure: OPEN REDUCTION INTERNAL FIXATION (ORIF) ANKLE FRACTURE;  Surgeon: Corky Mull, MD;  Location: [facility] ORS;  Service: Orthopedics;  Laterality: Right;  . ORIF HUMERUS FRACTURE Right 12/20/2019   Procedure: OPEN REDUCTION INTERNAL FIXATION (ORIF) DISTAL HUMERUS FRACTURE;  Surgeon: Shona Needles, MD;  Location: [place];  Service: Orthopedics;  Laterality: Right;  . ORIF WRIST FRACTURE Right 12/29/2018   Procedure: OPEN REDUCTION INTERNAL FIXATION (ORIF) WRIST FRACTURE;  Surgeon: Corky Mull, MD;  Location: [facility] ORS;  Service: Orthopedics;  Laterality: Right;  . PARATHYROIDECTOMY    . TUBAL LIGATION     Family History  Problem Relation Age of Onset  . Heart disease Father    Social History   Socioeconomic History  . Marital status: Single    Spouse name: Not on file  . Number of children: Not on file  . Years of education: Not on file  . Highest education level: Not on file  Occupational History  . Not on file  Tobacco Use  . Smoking status: Former Smoker    Packs/day: 1.00    Years: 44.00    Pack years: 44.00    Types: Cigarettes    Quit date: 07/15/2009    Years since quitting: 10.6  . Smokeless tobacco: Never Used  . Tobacco comment: quit 06/2009 smoked from 20s to 2011 1.5 ppd no FH lung cancer   Vaping Use  . Vaping Use: Never used  Substance and Sexual Activity  . Alcohol use: Yes    Alcohol/week: 1.0 standard drink    Types: 1 Standard drinks or equivalent per week    Comment: occ.  . Drug use: No  . Sexual activity: Not Currently    Birth  control/protection: Post-menopausal  Other Topics Concern  . 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    Exercise- walks daily , wears fit bit.    Social Determinants of Health   Financial Resource Strain: Low Risk   . Difficulty of Paying Living Expenses: Not hard at all  Food Insecurity: No Food Insecurity  . Worried About Charity fundraiser in the  Last Year: Never true  . Ran Out of Food in the Last Year: Never true  Transportation Needs: No Transportation Needs  . Lack of Transportation (Medical): No  . Lack of Transportation (Non-Medical): No  Physical Activity:   . Days of Exercise per Week: Not on file  . Minutes of Exercise per Session: Not on file  Stress: No Stress Concern Present  . Feeling of Stress : Not at all  Social Connections:   . Frequency of Communication with Friends and Family: Not on file  . Frequency of Social Gatherings with Friends and Family: Not on file  . Attends Religious Services: Not on file  . Active Member of Clubs or Organizations: Not on file  . Attends Archivist Meetings: Not on file  . Marital Status: Not on file    Tobacco Counseling Counseling given: Not Answered Comment: quit 06/2009 smoked from 20s to 2011 1.5 ppd no FH lung cancer    Clinical Intake:  Pre-visit preparation completed: Yes        Diabetes: No  How often do you need to have someone help you when you read instructions, pamphlets, or other written materials from your doctor or pharmacy?: 1 - Never   Interpreter Needed?: No      Activities of Daily Living In your present state of health, do you have any difficulty performing the following activities: 03/01/2020 12/20/2019  Hearing? Y N  Vision? N N  Difficulty concentrating or making decisions? N N  Walking or climbing stairs? Y N  Comment Avoids stairs -  Dressing or bathing? N N  Doing errands,  shopping? N -  Preparing Food and eating ? N -  Using the Toilet? N -  In the past six months, have you accidently leaked urine? Y -  Comment Managed with daily depend/brief. -  Do you have problems with loss of bowel control? N -  Managing your Medications? N -  Managing your Finances? N -  Housekeeping or managing your Housekeeping? N -  Some recent data might be hidden    Patient Care Team: Burnard Hawthorne, FNP as PCP - General (Family Medicine) Minna Merritts, MD as PCP - Cardiology (Cardiology)  Indicate any recent Medical Services you may have received from other than [facility] providers in the past year (date may be approximate).     Assessment:   This is a routine wellness examination for [name]. I connected with [name] today by telephone and verified that I am speaking with the correct person using two identifiers. Location patient: home Location provider: work Persons participating in the virtual visit: patient, Marine scientist.    I discussed the limitations, risks, security and privacy concerns of performing an evaluation and management service by telephone and the availability of in person appointments. The patient expressed understanding and verbally consented to this telephonic visit.    Interactive audio and video telecommunications were attempted between this provider and patient, however failed, due to patient having technical difficulties OR patient did not have access to video capability.  We continued and completed visit with audio only.  Some vital signs may be absent or patient reported.   Hearing/Vision screen  Hearing Screening   125Hz  250Hz  500Hz  1000Hz  2000Hz  3000Hz  4000Hz  6000Hz  8000Hz   Right ear:           Left ear:           Comments: Patient is notes some difficulty hearing conversational tones in a crowd.   Hearing  aid, bilateral as needed  Vision Screening Comments: Followed by Dr. Zigmund Daniel, [facility]. Macular degeneration; injections  every 8 weeks.   Dietary issues and exercise activities discussed:    Healthy diet Good water intake  Goals      Patient Stated   .  "I want to maintain" (pt-stated)      Stay active Stay hydrated Healthy diet      Depression Screen PHQ 2/9 Scores 03/01/2020 10/27/2019 03/01/2019 12/07/2018 04/15/2018 06/29/2017 03/26/2016  PHQ - 2 Score 0 0 0 0 0 0 0  PHQ- 9 Score - - - - 2 - -    Fall Risk Fall Risk  03/01/2020 11/17/2019 10/27/2019 10/13/2019 03/01/2019  Falls in the past year? (No Data) 0 1 0 1  Comment None since reported in August 2021. - - - -  Number falls in past yr: - - 0 0 0  Injury with Fall? - - 1 0 1  Comment - - - - Tripped over her dog several months ago. Followed by ortho and pcp.  Follow up Falls evaluation completed - Falls evaluation completed - Falls prevention discussed;Education provided   Handrails in use when climbing stairs? Yes Home free of loose throw rugs in walkways, pet beds, electrical cords, etc? Yes  Adequate lighting in your home to reduce risk of falls? Yes   ASSISTIVE DEVICES UTILIZED TO PREVENT FALLS: Life alert? No   Simply safe alarm system? Yes Use of a cane, walker or w/c? No  Grab bars in the bathroom? Yes  Shower chair or bench in shower? Yes  Elevated toilet seat or a handicapped toilet? Yes   TIMED UP AND GO: Was the test performed? No . Virtual visit.   Cognitive Function:  Patient is alert and oriented x3.  Denies difficulty focusing, making decisions, memory loss.  Enjoys playing word search, online games and other brain challenging activities.  MMSE/6CIT deferred. Normal by direct communication/observation.   6CIT Screen 03/01/2019  What Year? 0 points  What month? 0 points  What time? 0 points  Count back from 20 0 points  Months in reverse 0 points  Repeat phrase 0 points  Total Score 0    Immunizations Immunization History  Administered Date(s) Administered  . 19-influenza Whole 01/28/2017  . Fluad  Quad(high Dose 65+) 01/31/2019  . Influenza Split 01/19/2012  . Influenza-Unspecified 03/21/2010, 01/14/2011, 01/29/2012, 01/18/2013, 01/22/2014, 01/16/2015, 01/29/2016  . PFIZER SARS-COV-2 Vaccination 06/01/2019, 06/23/2019  . Pneumococcal Conjugate-13 01/04/2014  . Pneumococcal Polysaccharide-23 10/03/2008  . Td 06/26/2010  . Tdap 06/26/2010  . Zoster 11/19/2007   Health Maintenance There are no preventive care reminders to display for this patient. Health Maintenance  Topic Date Due  . INFLUENZA VACCINE  07/18/2020 (Originally 11/19/2019)  . TETANUS/TDAP  06/25/2020  . MAMMOGRAM  04/06/2021  . COLONOSCOPY  11/05/2022  . DEXA SCAN  Completed  . COVID-19 Vaccine  Completed  . Hepatitis C Screening  Completed  . PNA vac Low Risk Adult  Completed   Colorectal cancer screening: Completed 11/04/12. Repeat every 10 years   Mammogram status: Completed 04/07/19. Repeat every year. Bilateral.  Bone Density status: Completed 04/06/17. Results reflect: Bone density results: OSTEOPOROSIS. Repeat every 2 years.  Hepatitis C Screening: Completed 09/14/19.  Vision Screening: Recommended annual ophthalmology exams for early detection of glaucoma and other disorders of the eye. Is the patient up to date with their annual eye exam?  Yes  Who is the provider or what is the name of  the office in which the patient attends annual eye exams? Dr. Zigmund Daniel, [facility].  Dental Screening: Recommended annual dental exams for proper oral hygiene  Community Resource Referral / Chronic Care Management: CRR required this visit?  No   CCM required this visit?  No      Plan:   Keep all routine maintenance appointments.   I have personally reviewed and noted the following in the patient's chart:   . Medical and social history . Use of alcohol, tobacco or illicit drugs  . Current medications and supplements . Functional ability and status . Nutritional status . Physical  activity . Advanced directives . List of other physicians . Hospitalizations, surgeries, and ER visits in previous 12 months . Vitals . Screenings to include cognitive, depression, and falls . Referrals and appointments  In addition, I have reviewed and discussed with patient certain preventive protocols, quality metrics, and best practice recommendations. A written personalized care plan for preventive services as well as general preventive health recommendations were provided to patient via mychart.     Varney Biles, LPN   [id number]      Agree with plan. [name], NP

## 2020-03-01 NOTE — Patient Instructions (Addendum)
[name] , Thank you for taking time to come for your Medicare Wellness Visit. I appreciate your ongoing commitment to your health goals. Please review the following plan we discussed and let me know if I can assist you in the future.   These are the goals we discussed: Goals      Patient Stated     "I want to maintain" (pt-stated)      Stay active Stay hydrated Healthy diet       This is a list of the screening recommended for you and due dates:  Health Maintenance  Topic Date Due   Flu Shot  07/18/2020*   Tetanus Vaccine  06/25/2020   Mammogram  04/06/2021   Colon Cancer Screening  11/05/2022   DEXA scan (bone density measurement)  Completed   COVID-19 Vaccine  Completed    Hepatitis C: One time screening is recommended by Center for Disease Control  (CDC) for  adults born from [birth year] through 1963.   Completed   Pneumonia vaccines  Completed  *Topic was postponed. The date shown is not the original due date.    Immunizations Immunization History  Administered Date(s) Administered   19-influenza Whole 01/28/2017   Fluad Quad(high Dose 65+) 01/31/2019   Influenza Split 01/19/2012   Influenza-Unspecified 03/21/2010, 01/14/2011, 01/29/2012, 01/18/2013, 01/22/2014, 01/16/2015, 01/29/2016   PFIZER SARS-COV-2 Vaccination 06/01/2019, 06/23/2019   Pneumococcal Conjugate-13 01/04/2014   Pneumococcal Polysaccharide-23 10/03/2008   Td 06/26/2010   Tdap 06/26/2010   Zoster 11/19/2007   Keep all routine maintenance appointments.   Advanced directives: mailed per request.    Conditions/risks identified: none new.  Follow up in one year for your annual wellness visit.   Preventive Care 103 Years and Older, Female Preventive care refers to lifestyle choices and visits with your health care provider that can promote health and wellness. What does preventive care include?  A yearly physical exam. This is also called an annual well check.  Dental exams once or  twice a year.  Routine eye exams. Ask your health care provider how often you should have your eyes checked.  Personal lifestyle choices, including:  Daily care of your teeth and gums.  Regular physical activity.  Eating a healthy diet.  Avoiding tobacco and drug use.  Limiting alcohol use.  Practicing safe sex.  Taking low-dose aspirin every day.  Taking vitamin and mineral supplements as recommended by your health care provider. What happens during an annual well check? The services and screenings done by your health care provider during your annual well check will depend on your age, overall health, lifestyle risk factors, and family history of disease. Counseling  Your health care provider may ask you questions about your:  Alcohol use.  Tobacco use.  Drug use.  Emotional well-being.  Home and relationship well-being.  Sexual activity.  Eating habits.  History of falls.  Memory and ability to understand (cognition).  Work and work Statistician.  Reproductive health. Screening  You may have the following tests or measurements:  Height, weight, and BMI.  Blood pressure.  Lipid and cholesterol levels. These may be checked every 5 years, or more frequently if you are over 40 years old.  Skin check.  Lung cancer screening. You may have this screening every year starting at age 25 if you have a 30-pack-year history of smoking and currently smoke or have quit within the past 15 years.  Fecal occult blood test (FOBT) of the stool. You may have this test every year  starting at age 48.  Flexible sigmoidoscopy or colonoscopy. You may have a sigmoidoscopy every 5 years or a colonoscopy every 10 years starting at age 71.  Hepatitis C blood test.  Hepatitis B blood test.  Sexually transmitted disease (STD) testing.  Diabetes screening. This is done by checking your blood sugar (glucose) after you have not eaten for a while (fasting). You may have this done  every 1-3 years.  Bone density scan. This is done to screen for osteoporosis. You may have this done starting at age 59.  Mammogram. This may be done every 1-2 years. Talk to your health care provider about how often you should have regular mammograms. Talk with your health care provider about your test results, treatment options, and if necessary, the need for more tests. Vaccines  Your health care provider may recommend certain vaccines, such as:  Influenza vaccine. This is recommended every year.  Tetanus, diphtheria, and acellular pertussis (Tdap, Td) vaccine. You may need a Td booster every 10 years.  Zoster vaccine. You may need this after age 72.  Pneumococcal 13-valent conjugate (PCV13) vaccine. One dose is recommended after age 49.  Pneumococcal polysaccharide (PPSV23) vaccine. One dose is recommended after age 39. Talk to your health care provider about which screenings and vaccines you need and how often you need them. This information is not intended to replace advice given to you by your health care provider. Make sure you discuss any questions you have with your health care provider. Document Released: 05/03/2015 Document Revised: 12/25/2015 Document Reviewed: 02/05/2015 Elsevier Interactive Patient Education  2017 [place] Prevention in the Home Falls can cause injuries. They can happen to people of all ages. There are many things you can do to make your home safe and to help prevent falls. What can I do on the outside of my home?  Regularly fix the edges of walkways and driveways and fix any cracks.  Remove anything that might make you trip as you walk through a door, such as a raised step or threshold.  Trim any bushes or trees on the path to your home.  Use bright outdoor lighting.  Clear any walking paths of anything that might make someone trip, such as rocks or tools.  Regularly check to see if handrails are loose or broken. Make sure that both  sides of any steps have handrails.  Any raised decks and porches should have guardrails on the edges.  Have any leaves, snow, or ice cleared regularly.  Use sand or salt on walking paths during winter.  Clean up any spills in your garage right away. This includes oil or grease spills. What can I do in the bathroom?  Use night lights.  Install grab bars by the toilet and in the tub and shower. Do not use towel bars as grab bars.  Use non-skid mats or decals in the tub or shower.  If you need to sit down in the shower, use a plastic, non-slip stool.  Keep the floor dry. Clean up any water that spills on the floor as soon as it happens.  Remove soap buildup in the tub or shower regularly.  Attach bath mats securely with double-sided non-slip rug tape.  Do not have throw rugs and other things on the floor that can make you trip. What can I do in the bedroom?  Use night lights.  Make sure that you have a light by your bed that is easy to reach.  Do not  use any sheets or blankets that are too big for your bed. They should not hang down onto the floor.  Have a firm chair that has side arms. You can use this for support while you get dressed.  Do not have throw rugs and other things on the floor that can make you trip. What can I do in the kitchen?  Clean up any spills right away.  Avoid walking on wet floors.  Keep items that you use a lot in easy-to-reach places.  If you need to reach something above you, use a strong step stool that has a grab bar.  Keep electrical cords out of the way.  Do not use floor polish or wax that makes floors slippery. If you must use wax, use non-skid floor wax.  Do not have throw rugs and other things on the floor that can make you trip. What can I do with my stairs?  Do not leave any items on the stairs.  Make sure that there are handrails on both sides of the stairs and use them. Fix handrails that are broken or loose. Make sure that  handrails are as long as the stairways.  Check any carpeting to make sure that it is firmly attached to the stairs. Fix any carpet that is loose or worn.  Avoid having throw rugs at the top or bottom of the stairs. If you do have throw rugs, attach them to the floor with carpet tape.  Make sure that you have a light switch at the top of the stairs and the bottom of the stairs. If you do not have them, ask someone to add them for you. What else can I do to help prevent falls?  Wear shoes that:  Do not have high heels.  Have rubber bottoms.  Are comfortable and fit you well.  Are closed at the toe. Do not wear sandals.  If you use a stepladder:  Make sure that it is fully opened. Do not climb a closed stepladder.  Make sure that both sides of the stepladder are locked into place.  Ask someone to hold it for you, if possible.  Clearly mark and make sure that you can see:  Any grab bars or handrails.  First and last steps.  Where the edge of each step is.  Use tools that help you move around (mobility aids) if they are needed. These include:  Canes.  Walkers.  Scooters.  Crutches.  Turn on the lights when you go into a dark area. Replace any light bulbs as soon as they burn out.  Set up your furniture so you have a clear path. Avoid moving your furniture around.  If any of your floors are uneven, fix them.  If there are any pets around you, be aware of where they are.  Review your medicines with your doctor. Some medicines can make you feel dizzy. This can increase your chance of falling. Ask your doctor what other things that you can do to help prevent falls. This information is not intended to replace advice given to you by your health care provider. Make sure you discuss any questions you have with your health care provider. Document Released: 01/31/2009 Document Revised: 09/12/2015 Document Reviewed: 05/11/2014 Elsevier Interactive Patient Education  2017  [employer].

## 2020-03-19 ENCOUNTER — Telehealth: Payer: Self-pay | Admitting: Cardiovascular Disease

## 2020-03-19 NOTE — Telephone Encounter (Signed)
Error

## 2020-03-20 ENCOUNTER — Telehealth: Payer: Self-pay

## 2020-03-20 DIAGNOSIS — E782 Mixed hyperlipidemia: Secondary | ICD-10-CM

## 2020-03-20 DIAGNOSIS — I2510 Atherosclerotic heart disease of native coronary artery without angina pectoris: Secondary | ICD-10-CM

## 2020-03-20 DIAGNOSIS — R748 Abnormal levels of other serum enzymes: Secondary | ICD-10-CM

## 2020-03-20 NOTE — Telephone Encounter (Signed)
Attempted to call pt, was able to leave a detail message on her VM (DPR verify), advised that Dr. Rockey Situ stated okay to [facility] her 6 month f/u on 12/6 and Dr. Rockey Situ is okay with ordering LFTs and Lipid panel. Advised that lab orders have been placed and she may go to medical mall at her convince to have these drawn before her upcoming appt anytime M-F 07:30am-5:00pm. If any further questions my reach out by MyChart or call the clinic.   This was also applied to her ongoing MyChart message per previous conversations.

## 2020-03-20 NOTE — Telephone Encounter (Signed)
----- Message from Minna Merritts, MD sent at 03/20/2020  1:49 PM EST ----- Regarding: RE: Pt f/u Ok to place order for LFTS and lipids Thx TG ----- Message ----- From: Wilma Flavin, RN Sent: 03/20/2020   9:40 AM EST To: Valora Corporal, RN, Minna Merritts, MD Subject: Melton Alar: Pt f/u                                      ----- Message ----- From: Britt Bottom, CMA Sent: 03/20/2020   9:28 AM EST To: Cv Div Burl Triage Subject: FW: Pt f/u                                     Pt requesting lab orders for Blood work for lipid/LFT's. Please advise if ok for pt to have lab work drawn before visit. Pt mentioned that she would like to go to medical mall if labs are needed before visit. ----- Message ----- From: Clarisse Gouge Sent: 03/19/2020   4:23 PM EST To: Valora Corporal, RN, Britt Bottom, CMA Subject: RE: Pt f/u                                     [facility] with patient she wants to keep 6 m fu visit .    [name] -   She also requested Labs prior to visit.  Please order for medical mall if needed as patient wants to go sometime this week for lipid.  ----- Message ----- From: Britt Bottom, CMA Sent: 03/19/2020   3:42 PM EST To: Cv [facility] Scheduling Subject: FW: Pt f/u                                     Pt has upcoming appointment. Pt may f/u 6 month if pt would like to see Gollan if not pt may f/u in 12 months as long as pt is feeling well. Pt avs and disposition both have different instructions on f/u. Please advise for f/u appointment. ----- Message ----- From: Wynema Birch, RN Sent: 03/19/2020   3:13 PM EST To: Britt Bottom, CMA Subject: Pt f/u                                         I looked at her visit from June, on Dr. Rockey Situ notes it does say a 12 month f/u, however if you look on pt's AVS he recommended f/u in 6 months as was schedule 12/6. She may keep this appt if she would like to see Dr. Rockey Situ, but if she would like to cancle and schedule  out for 12 months that is fine as well long as she feels ok, no s/s of heart issues and is okay with current medications.  Thanks ----- Message ----- From: Britt Bottom, CMA Sent: 03/19/2020   2:21 PM EST To: Rebeca Alert Burl Triage  Pt has f/U 03/19/20 AVS says 6 month and Dr. Rockey Situ Disposition says 12 months. Please advise if ok to keep  appointment.

## 2020-03-21 ENCOUNTER — Telehealth: Payer: Self-pay | Admitting: *Deleted

## 2020-03-21 NOTE — Telephone Encounter (Signed)
-----   Message from Wilma Flavin, RN sent at 03/20/2020  9:40 AM EST ----- Regarding: FW: Pt f/u  ----- Message ----- From: Britt Bottom, CMA Sent: 03/20/2020   9:28 AM EST To: Cv Div Burl Triage Subject: FW: Pt f/u                                     Pt requesting lab orders for Blood work for lipid/LFT's. Please advise if ok for pt to have lab work drawn before visit. Pt mentioned that she would like to go to medical mall if labs are needed before visit. ----- Message ----- From: Clarisse Gouge Sent: 03/19/2020   4:23 PM EST To: Valora Corporal, RN, Britt Bottom, CMA Subject: RE: Pt f/u                                     [facility] with patient she wants to keep 6 m fu visit .    [name] -   She also requested Labs prior to visit.  Please order for medical mall if needed as patient wants to go sometime this week for lipid.  ----- Message ----- From: Britt Bottom, CMA Sent: 03/19/2020   3:42 PM EST To: Cv Div Burl Scheduling Subject: FW: Pt f/u                                     Pt has upcoming appointment. Pt may f/u 6 month if pt would like to see Gollan if not pt may f/u in 12 months as long as pt is feeling well. Pt avs and disposition both have different instructions on f/u. Please advise for f/u appointment. ----- Message ----- From: Wynema Birch, RN Sent: 03/19/2020   3:13 PM EST To: Britt Bottom, CMA Subject: Pt f/u                                         I looked at her visit from June, on Dr. Rockey Situ notes it does say a 12 month f/u, however if you look on pt's AVS he recommended f/u in 6 months as was schedule 12/6. She may keep this appt if she would like to see Dr. Rockey Situ, but if she would like to cancle and schedule out for 12 months that is fine as well long as she feels ok, no s/s of heart issues and is okay with current medications.  Thanks ----- Message ----- From: Britt Bottom, CMA Sent: 03/19/2020   2:21 PM EST To: Rebeca Alert Burl  Triage  Pt has f/U 03/19/20 AVS says 6 month and Dr. Rockey Situ Disposition says 12 months. Please advise if ok to keep appointment.

## 2020-03-21 NOTE — Progress Notes (Unsigned)
Patient left the office after the EKG before she could be seen

## 2020-03-21 NOTE — Telephone Encounter (Signed)
Labs addressed via a MyChart message from yesterday.

## 2020-03-22 ENCOUNTER — Other Ambulatory Visit
Admission: RE | Admit: 2020-03-22 | Discharge: 2020-03-22 | Disposition: A | Discharge status: Home / Self Care | Payer: Medicare HMO | Attending: Cardiovascular Disease | Admitting: Cardiovascular Disease

## 2020-03-22 ENCOUNTER — Other Ambulatory Visit: Payer: Self-pay

## 2020-03-22 DIAGNOSIS — I2510 Atherosclerotic heart disease of native coronary artery without angina pectoris: Secondary | ICD-10-CM | POA: Insufficient documentation

## 2020-03-22 DIAGNOSIS — E782 Mixed hyperlipidemia: Secondary | ICD-10-CM | POA: Diagnosis present

## 2020-03-22 DIAGNOSIS — R748 Abnormal levels of other serum enzymes: Secondary | ICD-10-CM | POA: Diagnosis present

## 2020-03-22 LAB — HEPATIC FUNCTION PANEL
ALT: 19 U/L (ref 0–44)
AST: 25 U/L (ref 15–41)
Albumin: 3.9 g/dL (ref 3.5–5.0)
Alkaline Phosphatase: 90 U/L (ref 38–126)
Bilirubin, Direct: <0.1 mg/dL (ref 0.0–0.2)
Total Bilirubin: 0.6 mg/dL (ref 0.3–1.2)
Total Protein: 6.4 g/dL — ABNORMAL LOW (ref 6.5–8.1)

## 2020-03-22 LAB — LIPID PANEL
Cholesterol: 219 mg/dL — ABNORMAL HIGH (ref 0–200)
HDL: 91 mg/dL (ref >40)
LDL Cholesterol: 118 mg/dL — ABNORMAL HIGH (ref 0–99)
Total CHOL/HDL Ratio: 2.4 RATIO
Triglycerides: 48 mg/dL (ref <150)
VLDL: 10 mg/dL (ref 0–40)

## 2020-03-25 ENCOUNTER — Encounter: Payer: Self-pay | Admitting: Cardiovascular Disease

## 2020-03-25 ENCOUNTER — Other Ambulatory Visit: Payer: Self-pay

## 2020-03-25 ENCOUNTER — Ambulatory Visit: Payer: Medicare HMO | Admitting: Cardiovascular Disease

## 2020-03-25 VITALS — BP 122/60 | HR 70 | Ht 60.0 in | Wt 133.0 lb

## 2020-03-25 DIAGNOSIS — E782 Mixed hyperlipidemia: Secondary | ICD-10-CM

## 2020-03-25 DIAGNOSIS — R748 Abnormal levels of other serum enzymes: Secondary | ICD-10-CM

## 2020-03-25 DIAGNOSIS — I10 Essential (primary) hypertension: Secondary | ICD-10-CM

## 2020-03-25 DIAGNOSIS — I25118 Atherosclerotic heart disease of native coronary artery with other forms of angina pectoris: Secondary | ICD-10-CM

## 2020-03-25 DIAGNOSIS — I739 Peripheral vascular disease, unspecified: Secondary | ICD-10-CM

## 2020-03-27 ENCOUNTER — Telehealth: Payer: Self-pay

## 2020-03-27 NOTE — Telephone Encounter (Signed)
Called pt regarding her recent MyChart message   "I started back on the Crestor last week. I don't recall having my cholesterol being that high. I don't want any hard feelings because I like you as a doctor and a friend.  I will also need a refill on my Crestor. Thanks"  Pt reports that she has enough Crestor to last her until her appt with Sharolyn Douglas, on 12/15 but would need a refill then. Also, pt "does not want any hard feelings with Dr. Rockey Situ" regarding her recent visit when she left before being sen by provider when he tried to get her to stay as she was walking out. Explain everything was okay that she still is a [facility] pt and she can still make appt with him when time is available. Reports concern regarding her $20 co-pay 12/6, if she will need to pay again on 12/15 since she did not see the provider. Unsure of this answer, advised pt to call back and ask the front desk since she is currently walking into another appt at this time. Pt reports will call back later on today or tomorrow to enquire that answer and to see about scheduling an appt with Dr. Rockey Situ for f/u as well.

## 2020-03-28 ENCOUNTER — Other Ambulatory Visit: Payer: Self-pay

## 2020-03-28 ENCOUNTER — Encounter (INDEPENDENT_AMBULATORY_CARE_PROVIDER_SITE_OTHER): Payer: Medicare HMO | Admitting: Ophthalmology

## 2020-03-28 DIAGNOSIS — H43813 Vitreous degeneration, bilateral: Secondary | ICD-10-CM

## 2020-03-28 DIAGNOSIS — H35372 Puckering of macula, left eye: Secondary | ICD-10-CM | POA: Diagnosis not present

## 2020-03-28 DIAGNOSIS — H353231 Exudative age-related macular degeneration, bilateral, with active choroidal neovascularization: Secondary | ICD-10-CM

## 2020-04-03 ENCOUNTER — Other Ambulatory Visit: Payer: Self-pay

## 2020-04-03 ENCOUNTER — Encounter: Payer: Self-pay | Admitting: Nurse Practitioner

## 2020-04-03 ENCOUNTER — Ambulatory Visit: Payer: Medicare HMO | Admitting: Nurse Practitioner

## 2020-04-03 VITALS — BP 124/64 | HR 70 | Ht 60.0 in | Wt 133.2 lb

## 2020-04-03 DIAGNOSIS — I739 Peripheral vascular disease, unspecified: Secondary | ICD-10-CM

## 2020-04-03 DIAGNOSIS — I10 Essential (primary) hypertension: Secondary | ICD-10-CM | POA: Diagnosis not present

## 2020-04-03 DIAGNOSIS — I2510 Atherosclerotic heart disease of native coronary artery without angina pectoris: Secondary | ICD-10-CM | POA: Diagnosis not present

## 2020-04-03 DIAGNOSIS — E785 Hyperlipidemia, unspecified: Secondary | ICD-10-CM

## 2020-04-03 MED ORDER — ROSUVASTATIN CALCIUM 40 MG PO TABS
40.0000 mg | ORAL_TABLET | Freq: Every day | ORAL | 3 refills | Status: DC
Start: 2020-04-03 — End: 2020-08-12

## 2020-04-03 NOTE — Progress Notes (Signed)
Office Visit    Patient Name: [name] Date of Encounter: 04/03/2020  Primary Care Provider:  Burnard Hawthorne, FNP Primary Cardiologist:  Ida Rogue, MD  Chief Complaint    74 year old female with a history of CAD, hypertension, hyperlipidemia, and peripheral vascular disease, who presents for follow-up related to elevated lipids.  Past Medical History    Past Medical History:  Diagnosis Date  . Abnormal Pap smear of cervix    Dr. Prentice Docker   . Coronary artery disease    a. 06/2009 MI/PCI to LCX and LAD; b. 01/2011 Occluded stents-->CABG x 3 ([facility]): LIMA->LAD, VG->LCX, VG->RPDA; c. 01/2012 Cath: LM 50, LAD 95ost, 135m, LCX 95ost, 125m, RCA 63m, RPDA small, LIMA->LAD ok, VG->LCX ok, VG->RPDA 100-->Med rx.  . Degenerative disc disease, lumbar    L4/5 noted CT ab/pelvis 05/19/11   . DVT (deep venous thrombosis) ([place])    after childbirth age 74  . GERD (gastroesophageal reflux disease)   . Hyperlipidemia   . Hypertension    under control  . Macular degeneration   . Myocardial infarction ([place]) 2011  . Parathyroid adenoma    right 13 x 8 mm s/p resection   . PVD (peripheral vascular disease) ([place])    a. 01/2010 s/p Aortofemoral bypass.   Past Surgical History:  Procedure Laterality Date  . CARDIAC CATHETERIZATION  2013   [facility]  . CATARACT EXTRACTION    . COLONOSCOPY    . COLPOSCOPY    . CORONARY ARTERY BYPASS GRAFT  02/07/2010   x 3  . ERCP N/A 10/05/2019   Procedure: ENDOSCOPIC RETROGRADE CHOLANGIOPANCREATOGRAPHY (ERCP);  Surgeon: Lucilla Lame, MD;  Location: [facility] ENDOSCOPY;  Service: Endoscopy;  Laterality: N/A;  . FACIAL COSMETIC SURGERY  2013   Dr. Nadeen Landau  . FOOT SURGERY     right  . ILIO-FEMORAL BYPASS GRAFT  2011  . LIVER BIOPSY N/A 10/31/2019   Procedure: LIVER BIOPSY;  Surgeon: Olean Ree, MD;  Location: [facility] ORS;  Service: General;  Laterality: N/A;  . MITRAL VALVE REPAIR  01/2010  . ORIF ANKLE FRACTURE Right 10/08/2016   Procedure:  OPEN REDUCTION INTERNAL FIXATION (ORIF) ANKLE FRACTURE;  Surgeon: Corky Mull, MD;  Location: [facility] ORS;  Service: Orthopedics;  Laterality: Right;  . ORIF HUMERUS FRACTURE Right 12/20/2019   Procedure: OPEN REDUCTION INTERNAL FIXATION (ORIF) DISTAL HUMERUS FRACTURE;  Surgeon: Shona Needles, MD;  Location: [place];  Service: Orthopedics;  Laterality: Right;  . ORIF WRIST FRACTURE Right 12/29/2018   Procedure: OPEN REDUCTION INTERNAL FIXATION (ORIF) WRIST FRACTURE;  Surgeon: Corky Mull, MD;  Location: [facility] ORS;  Service: Orthopedics;  Laterality: Right;  . PARATHYROIDECTOMY    . TUBAL LIGATION      Allergies  No Known Allergies  History of Present Illness    74 year old female with the above past medical history including CAD status post prior circumflex and LAD stenting with subsequent CABG x3 performed at [facility] in October 2012.  Her last catheterization was in October 2013 revealing native multivessel disease with a patent LIMA to the LAD and vein graft to the left circumflex.  The vein graft to the RPDA was occluded.  She has been medically managed since.  Other history includes hypertension, hyperlipidemia, peripheral vascular disease status post aortofemoral bypass, and degenerative disc disease.  She was last seen in cardiology clinic on June 8 of this year, at which time she was doing well.  In the setting of elevated LFTs over the summer,  she was advised to hold Crestor and also hydrocodone. She notes that she was taking a lot of hydrocodone due to chronic back pain and it was presumed that the acetaminophen was driving LFT abnormalities. She was switched to oxycodone which she is currently taking every 5-6 hours. Follow-up LFTs in each of the past 3 months have been normal. She recently had lipids drawn in the setting of being off of Crestor for roughly 5 to 6 months and was shocked to see her total cholesterol rise to 219 with an LDL of 118. Notably her HDL is 91. Upon seeing her lipid  results, she resumed Crestor 40 mg daily. She has tolerated this well. From a cardiac standpoint she has not been having any chest pain or dyspnea. Activity is limited by chronic back pain but she is able to accomplish what she might normally need to do around her house without symptoms or limitations. She denies palpitations, PND, orthopnea, dizziness, syncope, edema, or early satiety.  Home Medications    Prior to Admission medications   Medication Sig Start Date End Date Taking? Authorizing Provider  aspirin 81 MG EC tablet Take 81 mg by mouth daily.      [provider]  BESIVANCE 0.6 % SUSP Place 4 drops into the left eye See admin instructions. Instill 4 drop into the left eye on the day of and the day after eye injection 06/22/19   [provider]  Cholecalciferol (VITAMIN D3) 1000 UNITS CAPS Take 1,000 Units by mouth daily.     [provider]  Coenzyme Q10 (CO Q10 PO) Take 1 capsule by mouth daily.    [provider]  cyclobenzaprine (FLEXERIL) 5 MG tablet Take 1 tablet (5 mg total) by mouth 3 (three) times daily as needed for muscle spasms. 11/01/19   Olean Ree, MD  doxylamine, Sleep, (SLEEP AID) 25 MG tablet Take 25 mg by mouth at bedtime as needed.     [provider]  ezetimibe (ZETIA) 10 MG tablet TAKE 1 TABLET BY MOUTH ONCE DAILY 10/09/19   Minna Merritts, MD  metoprolol tartrate (LOPRESSOR) 25 MG tablet Take 0.5 tablets (12.5 mg total) by mouth 2 (two) times daily. 10/09/19   Minna Merritts, MD  Multiple Vitamins-Minerals (PRESERVISION AREDS 2 PO) Take 1 capsule by mouth 2 (two) times daily.    [provider]  oxyCODONE (OXY IR/ROXICODONE) 5 MG immediate release tablet Take 5 mg by mouth 3 (three) times daily as needed. 03/07/20   [provider]  pantoprazole (PROTONIX) 40 MG tablet Take 1 tablet (40 mg total) by mouth daily. 01/15/20   Burnard Hawthorne, FNP  Probiotic Product (RISA-BID PROBIOTIC PO) Take 250 mg  by mouth daily.     [provider]  rosuvastatin (CRESTOR) 40 MG tablet Take 1 tablet (40 mg total) by mouth at bedtime. 12/04/19   Minna Merritts, MD  sertraline (ZOLOFT) 100 MG tablet TAKE 1 TABLET BY MOUTH ONCE DAILY 01/11/20   Burnard Hawthorne, FNP  traMADol (ULTRAM) 50 MG tablet Take 1 tablet (50 mg total) by mouth every 6 (six) hours as needed for severe pain. 12/20/19   Delray Alt, PA-C    Review of Systems    Chronic back pain.  She denies chest pain, palpitations, dyspnea, pnd, orthopnea, n, v, dizziness, syncope, edema, weight gain, or early satiety.  All other systems reviewed and are otherwise negative except as noted above.  Physical Exam    VS:  BP 124/64 (BP Location: Left Arm, Patient Position: Sitting, Cuff Size: Normal)   Pulse 70   Ht 5' (1.524 m)   Wt 133 lb 4 oz (60.4 kg)   SpO2 98%   BMI 26.02 kg/m  , BMI Body mass index is 26.02 kg/m. GEN: Well nourished, well developed, in no acute distress. HEENT: normal. Neck: Supple, no JVD, carotid bruits, or masses. Cardiac: RRR, no murmurs, rubs, or gallops. No clubbing, cyanosis, edema.  Radials/PT 2+ and equal bilaterally.  Respiratory:  Respirations regular and unlabored, clear to auscultation bilaterally. GI: Soft, nontender, nondistended, BS + x 4. MS: no deformity or atrophy. Skin: warm and dry, no rash. Neuro:  Strength and sensation are intact. Psych: Normal affect.  Accessory Clinical Findings    ECG personally reviewed by me today - RSR, 70, LAD, LVH w/ repol abnormalities - no acute changes.  Lab Results  Component Value Date   WBC 6.0 12/20/2019   HGB 11.3 (L) 12/20/2019   HCT 35.7 (L) 12/20/2019   MCV 96.2 12/20/2019   PLT 223 12/20/2019   Lab Results  Component Value Date   CREATININE 0.82 02/21/2020   BUN 22 02/21/2020   NA 135 02/21/2020   K 4.4 02/21/2020   CL 102 02/21/2020   CO2 23 02/21/2020   Lab Results  Component Value Date   ALT 19 03/22/2020   AST 25  03/22/2020   ALKPHOS 90 03/22/2020   BILITOT 0.6 03/22/2020   Lab Results  Component Value Date   CHOL 219 (H) 03/22/2020   HDL 91 03/22/2020   LDLCALC 118 (H) 03/22/2020   TRIG 48 03/22/2020   CHOLHDL 2.4 03/22/2020    Lab Results  Component Value Date   HGBA1C 5.7 06/09/2019    Assessment & Plan    1.  Coronary artery disease: Status post CABG x3 in October 2012.  2 of 3 patent grafts on catheterization October 2013.  She has been doing well since then.  Activity somewhat limited by chronic back pain but she does not experience chest pain or dyspnea.  She remains on aspirin, beta-blocker, and Zetia therapy.  Over the summer, she was taken off of Crestor in the setting of elevated LFTs (also on frequent hydrocodone doses).  LFTs have since normalized.  She just resumed Crestor about a week or so ago in the setting of rising lipid numbers.  We will plan follow-up lipids and LFTs in about a month.  2.  Hyperlipidemia: As above, Crestor held earlier this year in the setting of rising LFTs.  She was also taking frequent doses of hydrocodone at that time and has since been switched to oxycodone.  LFTs normalized by October and have been normal on November and December checks as well.  She resumed Crestor in the setting of total cholesterol 219 with an LDL of 118 on December 3.  She is currently tolerating well and has no prior history of myalgias.  We will continue Crestor and Zetia with plan for follow-up lipids and LFTs in about a month.  If she has repeat rise of LFTs on statin therapy, we can look to switch her to PCSK9 inhibitor.  3.  Essential hypertension: Stable on beta-blocker therapy.  4.  Peripheral arterial disease: Denies claudication.  She remains on aspirin and statin.  5.  Disposition: Follow-up lipids and LFTs in approximately 1 month.  Follow-up in clinic in 6 months or sooner if necessary.   Murray Hodgkins, NP 04/03/2020, 4:28 PM

## 2020-04-03 NOTE — Patient Instructions (Addendum)
Medication Instructions:  No changes  *If you need a refill on your cardiac medications before your next appointment, please call your pharmacy*   Lab Work: Lipid, Liver to be done over at [facility]. Make sure not to eat or drink anything after midnight except water with pills.   If you have labs (blood work) drawn today and your tests are completely normal, you will receive your results only by: Marland Kitchen MyChart Message (if you have MyChart) OR . A paper copy in the mail If you have any lab test that is abnormal or we need to change your treatment, we will call you to review the results.   Testing/Procedures: None   Follow-Up: At [facility], you and your health needs are our priority.  As part of our continuing mission to provide you with exceptional heart care, we have created designated Provider Care Teams.  These Care Teams include your primary Cardiologist (physician) and Advanced Practice Providers (APPs -  Physician Assistants and Nurse Practitioners) who all work together to provide you with the care you need, when you need it.    Your next appointment:   6 month(s)  The format for your next appointment:   In Person  Provider:   Ida Rogue, MD

## 2020-04-05 ENCOUNTER — Other Ambulatory Visit (INDEPENDENT_AMBULATORY_CARE_PROVIDER_SITE_OTHER): Payer: Medicare HMO

## 2020-04-05 DIAGNOSIS — I2510 Atherosclerotic heart disease of native coronary artery without angina pectoris: Secondary | ICD-10-CM

## 2020-04-17 ENCOUNTER — Telehealth: Payer: Self-pay | Admitting: Family

## 2020-04-17 NOTE — Telephone Encounter (Signed)
Call pt Mammogram normal at [facility] Please repeat in one year

## 2020-04-17 NOTE — Telephone Encounter (Signed)
Left detailed message on VM per DPR. Advised to call back with any questions or concerns.

## 2020-04-24 ENCOUNTER — Encounter (INDEPENDENT_AMBULATORY_CARE_PROVIDER_SITE_OTHER): Payer: Medicare HMO | Admitting: Ophthalmology

## 2020-04-25 ENCOUNTER — Other Ambulatory Visit: Payer: Self-pay

## 2020-04-25 ENCOUNTER — Encounter (INDEPENDENT_AMBULATORY_CARE_PROVIDER_SITE_OTHER): Payer: Medicare HMO | Admitting: Ophthalmology

## 2020-04-25 DIAGNOSIS — H35033 Hypertensive retinopathy, bilateral: Secondary | ICD-10-CM

## 2020-04-25 DIAGNOSIS — I10 Essential (primary) hypertension: Secondary | ICD-10-CM

## 2020-04-25 DIAGNOSIS — H35372 Puckering of macula, left eye: Secondary | ICD-10-CM | POA: Diagnosis not present

## 2020-04-25 DIAGNOSIS — H353231 Exudative age-related macular degeneration, bilateral, with active choroidal neovascularization: Secondary | ICD-10-CM | POA: Diagnosis not present

## 2020-04-25 DIAGNOSIS — H43813 Vitreous degeneration, bilateral: Secondary | ICD-10-CM

## 2020-04-29 ENCOUNTER — Telehealth: Payer: Self-pay | Admitting: Family

## 2020-04-29 NOTE — Telephone Encounter (Signed)
LMTCB

## 2020-04-29 NOTE — Telephone Encounter (Signed)
Call pt dexa shows worsening osteopenia of lumbar spine and osteoporosis of left proximal femur which has worsened  Please ask her to set up an appt to discuss treatment

## 2020-05-02 NOTE — Telephone Encounter (Signed)
LMTCB

## 2020-05-03 NOTE — Telephone Encounter (Signed)
Ptr scheduled 2/14 to discuss treatment & also wanted to take about overactive bladder.

## 2020-05-23 ENCOUNTER — Other Ambulatory Visit: Payer: Self-pay

## 2020-05-23 ENCOUNTER — Encounter (INDEPENDENT_AMBULATORY_CARE_PROVIDER_SITE_OTHER): Payer: Medicare HMO | Admitting: Ophthalmology

## 2020-05-23 DIAGNOSIS — H35372 Puckering of macula, left eye: Secondary | ICD-10-CM | POA: Diagnosis not present

## 2020-05-23 DIAGNOSIS — H35033 Hypertensive retinopathy, bilateral: Secondary | ICD-10-CM | POA: Diagnosis not present

## 2020-05-23 DIAGNOSIS — I10 Essential (primary) hypertension: Secondary | ICD-10-CM | POA: Diagnosis not present

## 2020-05-23 DIAGNOSIS — H353231 Exudative age-related macular degeneration, bilateral, with active choroidal neovascularization: Secondary | ICD-10-CM

## 2020-05-23 DIAGNOSIS — H43813 Vitreous degeneration, bilateral: Secondary | ICD-10-CM

## 2020-06-03 ENCOUNTER — Encounter: Payer: Self-pay | Admitting: Family

## 2020-06-03 ENCOUNTER — Telehealth (INDEPENDENT_AMBULATORY_CARE_PROVIDER_SITE_OTHER): Payer: Medicare HMO | Admitting: Family

## 2020-06-03 ENCOUNTER — Other Ambulatory Visit: Payer: Self-pay

## 2020-06-03 ENCOUNTER — Other Ambulatory Visit: Payer: Self-pay | Admitting: Family

## 2020-06-03 VITALS — Ht 60.0 in | Wt 133.0 lb

## 2020-06-03 DIAGNOSIS — R748 Abnormal levels of other serum enzymes: Secondary | ICD-10-CM

## 2020-06-03 DIAGNOSIS — F32A Depression, unspecified: Secondary | ICD-10-CM | POA: Diagnosis not present

## 2020-06-03 DIAGNOSIS — M816 Localized osteoporosis [Lequesne]: Secondary | ICD-10-CM

## 2020-06-03 DIAGNOSIS — F419 Anxiety disorder, unspecified: Secondary | ICD-10-CM | POA: Diagnosis not present

## 2020-06-03 DIAGNOSIS — N3281 Overactive bladder: Secondary | ICD-10-CM

## 2020-06-03 MED ORDER — OXYBUTYNIN CHLORIDE 5 MG PO TABS: 2.5000 mg | ORAL_TABLET | Freq: Two times a day (BID) | ORAL | 2 refills | Status: DC | PRN

## 2020-06-03 MED ORDER — DULOXETINE HCL 30 MG PO CPEP: ORAL_CAPSULE | ORAL | 3 refills | Status: DC

## 2020-06-03 NOTE — Assessment & Plan Note (Signed)
Resolved. Will follow.

## 2020-06-03 NOTE — Progress Notes (Signed)
Virtual Visit via Video Note  I connected with@  on 06/03/20 at  1:30 PM EST by a video enabled telemedicine application and verified that I am speaking with the correct person using two identifiers.  Location patient: home Location provider:work  Persons participating in the virtual visit: patient, provider  I discussed the limitations of evaluation and management by telemedicine and the availability of in person appointments. The patient expressed understanding and agreed to proceed.   HPI: Osteoporosis Discuss recent DEXA.   H/o right humeral fracture after fall 11/2019  H/o hyperparathyroidism and s/p parathyroidectomy with Dr Nadeen Landau. Has been on fosamax in the past. She was on medication for years and stopped due to concern for osteonecrosis ahead of dental procedure at that time.  She is interested in prolia.  No trouble swallowing. She has lost 3 teeth and planning dental procedures . 'I may have all of my teeth pulled.'   Calcium normal this past year. She doesn't take calcium. Compliant vitamin D 1000 units.  HLD- compliant with crestor , zetia.  PAD- compliant with asa 81mg . No bleeding.   Liver enzymes have normalized since she has started oxycodone. She is not on tramadol.   Depression and anxiety- compliant with zoloft 100mg . Chronic pain worsens depression.  Sleeps a lot. Isolated at home. No si/hi.    03/2020 [facility] RAD - 04/15/2020 5:46 PM EST  Lumbar spine: Osteopenia. The measurement has decreased significantly since the recent measurement but has increased significantly since baseline.  Left proximal femur:The femoral neck density indicates osteoporosis, but the total femoral density is normal. The measurement has decreased significantly since recent and baseline measurements. T score -1.7 spine T score-1.0 left femur  ROS: See pertinent positives and negatives per HPI.    EXAM:  VITALS per patient if applicable: Ht 5' (0.737 m)   Wt 133 lb (60.3  kg)   BMI 25.97 kg/m  BP Readings from Last 3 Encounters:  04/03/20 124/64  03/25/20 122/60  12/20/19 (!) 119/53   Wt Readings from Last 3 Encounters:  06/03/20 133 lb (60.3 kg)  04/03/20 133 lb 4 oz (60.4 kg)  03/25/20 133 lb (60.3 kg)    GENERAL: alert, oriented, appears well and in no acute distress  HEENT: atraumatic, conjunttiva clear, no obvious abnormalities on inspection of external nose and ears  NECK: normal movements of the head and neck  LUNGS: on inspection no signs of respiratory distress, breathing rate appears normal, no obvious gross SOB, gasping or wheezing  CV: no obvious cyanosis  MS: moves all visible extremities without noticeable abnormality  PSYCH/NEURO: pleasant and cooperative, no obvious depression or anxiety, speech and thought processing grossly intact  ASSESSMENT AND PLAN:  Discussed the following assessment and plan:  Problem List Items Addressed This Visit      Musculoskeletal and Integument   Osteoporosis - Primary    Counseled on risks of fracture however greater concern is upcoming dental procedures. Patient also has Biber of osteonecrosis understandably and I explained that both fosamax and prolia would carry this risk. In setting of hyperparathyroidism, I have ordered PTH studies and advised consult with endocrine as I would appreciate their advice in management. However currently with dental procedures, we would hold on starting treatment and plan to discuss later in the year.       Relevant Orders   PTH, intact and calcium   VITAMIN D 25 Hydroxy (Vit-D Deficiency, Fractures)     Other   Anxiety and depression  Uncontrolled. Wean off zoloft. Start Cymbalta. Close follow up      Relevant Medications   DULoxetine (CYMBALTA) 30 MG capsule   Elevated liver enzymes    Resolved. Will follow.          -we discussed possible serious and likely etiologies, options for evaluation and workup, limitations of telemedicine visit vs in  person visit, treatment, treatment risks and precautions. Pt prefers to treat via telemedicine empirically rather then risking or undertaking an in person visit at this moment.  .   I discussed the assessment and treatment plan with the patient. The patient was provided an opportunity to ask questions and all were answered. The patient agreed with the plan and demonstrated an understanding of the instructions.   The patient was advised to call back or seek an in-person evaluation if the symptoms worsen or if the condition fails to improve as anticipated.  I have spent 31 minutes with a patient including precharting, exam, reviewing medical records, and discussion plan of care regarding chronic pain, depression and osteoporosis.      [name], FNP

## 2020-06-03 NOTE — Assessment & Plan Note (Signed)
Uncontrolled. Wean off zoloft. Start Cymbalta. Close follow up

## 2020-06-03 NOTE — Patient Instructions (Addendum)
Decrease zoloft to 50mg  starting today.Take 50mg  zoloft for 3 days, then stop medication completely. Now you are off the zoloft.  THEN you may start  Cymbalta.

## 2020-06-03 NOTE — Assessment & Plan Note (Signed)
Counseled on risks of fracture however greater concern is upcoming dental procedures. Patient also has [name] of osteonecrosis understandably and I explained that both fosamax and prolia would carry this risk. In setting of hyperparathyroidism, I have ordered PTH studies and advised consult with endocrine as I would appreciate their advice in management. However currently with dental procedures, we would hold on starting treatment and plan to discuss later in the year.

## 2020-06-06 ENCOUNTER — Encounter: Payer: Self-pay | Admitting: Family

## 2020-06-18 ENCOUNTER — Other Ambulatory Visit: Payer: Self-pay | Admitting: Family

## 2020-06-18 ENCOUNTER — Other Ambulatory Visit: Payer: Self-pay

## 2020-06-18 ENCOUNTER — Telehealth: Payer: Self-pay

## 2020-06-18 ENCOUNTER — Other Ambulatory Visit (INDEPENDENT_AMBULATORY_CARE_PROVIDER_SITE_OTHER): Payer: Medicare HMO

## 2020-06-18 DIAGNOSIS — M7989 Other specified soft tissue disorders: Secondary | ICD-10-CM | POA: Diagnosis not present

## 2020-06-18 DIAGNOSIS — M816 Localized osteoporosis [Lequesne]: Secondary | ICD-10-CM | POA: Diagnosis not present

## 2020-06-18 LAB — BASIC METABOLIC PANEL
BUN: 20 mg/dL (ref 6–23)
CO2: 29 mEq/L (ref 19–32)
Calcium: 10 mg/dL (ref 8.4–10.5)
Chloride: 101 mEq/L (ref 96–112)
Creatinine, Ser: 0.72 mg/dL (ref 0.40–1.20)
GFR: 83.55 mL/min (ref >60.00)
Glucose, Bld: 92 mg/dL (ref 70–99)
Potassium: 4 mEq/L (ref 3.5–5.1)
Sodium: 136 mEq/L (ref 135–145)

## 2020-06-18 LAB — VITAMIN D 25 HYDROXY (VIT D DEFICIENCY, FRACTURES): VITD: 38.95 ng/mL (ref 30.00–100.00)

## 2020-06-18 NOTE — Telephone Encounter (Signed)
Patient came in today with Bilat leg swelling. She stated it is a little uncomfortable to walk. She stated she fells pins and needles while walking. Patient denies any sx of SOB, arm px, jaw px, chest px, weight gain, blurry vision, and heart palpitations. She has been elevating her legs with little relief.  She stated she is not taking any fluid medication and has not changed any medications. PCP was notified. Appointment has been scheduled tomorrow per PCP request.   Wt-127.4lbs BP- 93/59 BPM- 75 Temp-98.7 Spo2%-93

## 2020-06-18 NOTE — Addendum Note (Signed)
Addended by: Ezequiel Ganser on: 06/18/2020 12:18 PM   Modules accepted: Orders

## 2020-06-19 ENCOUNTER — Telehealth: Payer: Self-pay | Admitting: Family

## 2020-06-19 ENCOUNTER — Telehealth (INDEPENDENT_AMBULATORY_CARE_PROVIDER_SITE_OTHER): Payer: Medicare HMO | Admitting: Family

## 2020-06-19 ENCOUNTER — Encounter: Payer: Self-pay | Admitting: Family

## 2020-06-19 VITALS — Ht 60.0 in | Wt 123.4 lb

## 2020-06-19 DIAGNOSIS — R748 Abnormal levels of other serum enzymes: Secondary | ICD-10-CM

## 2020-06-19 DIAGNOSIS — M7989 Other specified soft tissue disorders: Secondary | ICD-10-CM | POA: Diagnosis not present

## 2020-06-19 NOTE — Telephone Encounter (Signed)
Just to clarify you ordered labs today & when should she have these done?

## 2020-06-19 NOTE — Telephone Encounter (Signed)
Called patient and scheduled a follow up in July. Tried to schedule labs, but patient stated she had labs the day before. Labs not scheduled.

## 2020-06-19 NOTE — Assessment & Plan Note (Addendum)
Improved. No sob. Weight is down.  Left > right per patient today as left leg was not elevated as right leg has been.  Per my exam yesterday, edema is dorsal aspect of foot and ankle and was symmetric.  Low risk based on wells criteria for DVT. Patient declines [place] of legs today. Advised compression stockings, low sodium. She will let me know swelling doesn't continue to improve as may opt for diuretic therapy.

## 2020-06-19 NOTE — Patient Instructions (Signed)
Compression stockings.  

## 2020-06-19 NOTE — Telephone Encounter (Signed)
Labs can be done in 2-4 weeks

## 2020-06-19 NOTE — Progress Notes (Signed)
Virtual Visit via Video Note  I connected with@  on 06/19/20 at 12:00 PM EST by a video enabled telemedicine application and verified that I am speaking with the correct person using two identifiers.  Location patient: home Location provider:work  Persons participating in the virtual visit: patient, provider  I discussed the limitations of evaluation and management by telemedicine and the availability of in person appointments. The patient expressed understanding and agreed to proceed.   HPI:  Complains of bilateral lower extremity swelling 2 weeks, waxes and wanes. Improved today. Swelling is top of the feet and ankle.   She notices when putting on shoes and would worsen by end of the day if she has done a lot of standing. 'Footie' sock indentation.  Swelling improves with elevation."right foot has gone down tremendously' Left leg swelling greater than right. She states last night right foot stayed elevated whereas left foot was on the ground. She is waiting on friend to come over to help put compression stockings on.  She thinks she has lost 3 lbs since yesterday based on home scale today.   No sob, heat in legs, pain in calves, rash,  recent surgery,cancer history.   h/o DVT. Swelling doesn't remind her blood clot she has at age 75 after childbirth. She is not on OCP.    ROS: See pertinent positives and negatives per HPI.    EXAM:  VITALS per patient if applicable: Ht 5' (6.294 m)   Wt 123 lb 6.4 oz (56 kg)   BMI 24.10 kg/m  BP Readings from Last 3 Encounters:  04/03/20 124/64  03/25/20 122/60  12/20/19 (!) 119/53   Wt Readings from Last 3 Encounters:  06/19/20 123 lb 6.4 oz (56 kg)  06/03/20 133 lb (60.3 kg)  04/03/20 133 lb 4 oz (60.4 kg)    GENERAL: alert, oriented, appears well and in no acute distress  HEENT: atraumatic, conjunttiva clear, no obvious abnormalities on inspection of external nose and ears  NECK: normal movements of the head and neck  LUNGS:  on inspection no signs of respiratory distress, breathing rate appears normal, no obvious gross SOB, gasping or wheezing  CV: no obvious cyanosis  MS: moves all visible extremities without noticeable abnormality  PSYCH/NEURO: pleasant and cooperative, no obvious depression or anxiety, speech and thought processing grossly intact  ASSESSMENT AND PLAN:  Discussed the following assessment and plan:  Problem List Items Addressed This Visit      Other   Elevated liver enzymes - Primary   Relevant Orders   Comprehensive metabolic panel   Lipid panel   Leg swelling    Improved. No sob. Weight is down.  Left > right per patient today as left leg was not elevated as right leg has been.  Per my exam yesterday, edema is dorsal aspect of foot and ankle and was symmetric.  Low risk based on wells criteria for DVT. Patient declines [place] of legs today. Advised compression stockings, low sodium. She will let me know swelling doesn't continue to improve as may opt for diuretic therapy.           -we discussed possible serious and likely etiologies, options for evaluation and workup, limitations of telemedicine visit vs in person visit, treatment, treatment risks and precautions. Pt prefers to treat via telemedicine empirically rather then risking or undertaking an in person visit at this moment.  .   I discussed the assessment and treatment plan with the patient. The patient was provided an opportunity  to ask questions and all were answered. The patient agreed with the plan and demonstrated an understanding of the instructions.   The patient was advised to call back or seek an in-person evaluation if the symptoms worsen or if the condition fails to improve as anticipated.   [name], FNP

## 2020-06-19 NOTE — Telephone Encounter (Signed)
Evaluated patient when she walked in to clinic. No sob, cp. Lung sounds clear. Symmetric non pitting edema assessed BLE dorsal aspect of foot to mid calf.  No  palpable cords or masses. No erythema or increased warmth.  No discoloration or varicosities noted. LE warm and palpable pedal pulses.  Advised to trial compression stocking, decrease salt intake, and elevate legs above level of heart.  She is scheduled for follow up 06/19/20

## 2020-06-19 NOTE — Telephone Encounter (Signed)
LMTCB to scheduled fasting labs in a couple weeks.

## 2020-06-20 ENCOUNTER — Other Ambulatory Visit: Payer: Self-pay

## 2020-06-20 ENCOUNTER — Encounter (INDEPENDENT_AMBULATORY_CARE_PROVIDER_SITE_OTHER): Payer: Medicare HMO | Admitting: Ophthalmology

## 2020-06-20 DIAGNOSIS — I10 Essential (primary) hypertension: Secondary | ICD-10-CM | POA: Diagnosis not present

## 2020-06-20 DIAGNOSIS — H35033 Hypertensive retinopathy, bilateral: Secondary | ICD-10-CM | POA: Diagnosis not present

## 2020-06-20 DIAGNOSIS — H43813 Vitreous degeneration, bilateral: Secondary | ICD-10-CM | POA: Diagnosis not present

## 2020-06-20 DIAGNOSIS — H353231 Exudative age-related macular degeneration, bilateral, with active choroidal neovascularization: Secondary | ICD-10-CM | POA: Diagnosis not present

## 2020-06-20 LAB — PTH, INTACT AND CALCIUM
Calcium: 10.2 mg/dL (ref 8.6–10.4)
PTH: 50 pg/mL (ref 14–64)

## 2020-06-21 ENCOUNTER — Encounter: Payer: Self-pay | Admitting: Family

## 2020-06-21 NOTE — Telephone Encounter (Signed)
I called [facility] in [place] to see if they had compression wraps available, since that was close to patent's home. They did not & suggested [facility], but they did not have the wraps as well. I called patient & she said that she had looked on [place], but Joycelyn Schmid had said that we could give her a couple we have here in office. I am placing upfront for patient's niece to pick up. She could not get her compression stockings on her left foot due to swelling even though she has them, so she felt that a wrap may would be an easier option for her.   I have placed upfront.

## 2020-06-24 ENCOUNTER — Encounter: Payer: Self-pay | Admitting: Family

## 2020-06-24 NOTE — Telephone Encounter (Signed)
Pt scheduled  

## 2020-06-25 ENCOUNTER — Other Ambulatory Visit: Payer: Self-pay | Admitting: Family

## 2020-06-25 DIAGNOSIS — M7989 Other specified soft tissue disorders: Secondary | ICD-10-CM

## 2020-06-25 MED ORDER — FUROSEMIDE 20 MG PO TABS: 20.0000 mg | ORAL_TABLET | Freq: Every day | ORAL | 1 refills | Status: DC | PRN

## 2020-06-25 NOTE — Telephone Encounter (Signed)
I have called patient & advised to let us know if swelling does not resolve. I also asked that she let us know if she feels she needs Lasix PRN or to take daily. Pt verbalized understanding & stated that she would do so!

## 2020-07-01 ENCOUNTER — Encounter: Payer: Self-pay | Admitting: Family

## 2020-07-02 ENCOUNTER — Other Ambulatory Visit: Payer: Medicare HMO

## 2020-07-15 ENCOUNTER — Ambulatory Visit: Payer: Medicare HMO | Admitting: Family

## 2020-07-18 ENCOUNTER — Encounter (INDEPENDENT_AMBULATORY_CARE_PROVIDER_SITE_OTHER): Payer: Medicare HMO | Admitting: Ophthalmology

## 2020-07-18 ENCOUNTER — Other Ambulatory Visit: Payer: Self-pay

## 2020-07-18 DIAGNOSIS — H35033 Hypertensive retinopathy, bilateral: Secondary | ICD-10-CM

## 2020-07-18 DIAGNOSIS — I10 Essential (primary) hypertension: Secondary | ICD-10-CM | POA: Diagnosis not present

## 2020-07-18 DIAGNOSIS — H35372 Puckering of macula, left eye: Secondary | ICD-10-CM

## 2020-07-18 DIAGNOSIS — H353231 Exudative age-related macular degeneration, bilateral, with active choroidal neovascularization: Secondary | ICD-10-CM

## 2020-07-18 DIAGNOSIS — H43813 Vitreous degeneration, bilateral: Secondary | ICD-10-CM

## 2020-08-01 NOTE — Telephone Encounter (Signed)
LMOV for patient to schedule

## 2020-08-05 NOTE — Progress Notes (Signed)
Cardiology Office Note  Date:  08/06/2020   ID:  [name], DOB May 29, 1945, MRN [id number]  PCP:  Burnard Hawthorne, FNP   Chief Complaint  Patient presents with  . Follow-up    Left Leg swelling greater than right    HPI:  [name] is a pleasant 75 year old woman with a history of  coronary artery disease, stent to her left circumflex and 2 stents to the LAD in March of 2011, aorto femoral bypass grafting in early October 2011,  with occlusion of her coronary stents in mid-October requiring bypass surgery at [facility] in mid to late October 2012.  Cath 2013: occluded SVG to PDA thyroid surgery March 2014 long history of smoking though stopped in March of 2011 plastic surgery on her face in the past She presents for routine followup of her coronary artery disease.  LOV 03/2020 Retired, Sedentary, no regular exercise program Radical weight loss this year, missing meals  anorexia, down 15 pounds this year  Was working Fall, broke elbow, Aug 2021 Chronic pain, on pain meds retired  No chest pain, No shortness of breath on exertion No chest pain or shortness of breath on exertion  Starting PT, [facility]  Left ankle swelling, none on right, etiology unclear Sits with leg down Rare lasix  On crestor and zetia daily Lab work reviewed Total cholesterol 119 LDL 42  Up to 219 total chol of statin Now back on statin  EKG personally reviewed by myself on todays visit nsr rate 85 bpm, pvc, nonspecific ST abn  Other past medical history reviewed Cardiac catheterization October 2013  Gallbladder attack April 2019 Declined surgery  cardiac catheterization October 2013 showed LIMA to the LAD, vein graft to the left circumflex, vein graft to the PDA which is occluded that supplies a very small area. Left main has 50% disease, ostial LAD has 95% in-stent restenosis, mid LAD has 100% disease, 75% ostial circumflex, 100% mid circumflex, very small OM1, 30% RCA disease, ejection  fraction 45%  Stress test shows suboptimal images due to GI activity on resting images, significant anterior wall ischemia in the mid to distal segments, reduced ejection fraction of 47%  Prior ventricular arrhythmia when her stents were placed in the Cath Lab requiring shock.  echocardiogram improved, EF greater than 40%.  Cardiac catheter report from July 15 2009 indicate she had a vision bare metal stent 2.75 x 28 mm in the LCX, mini vision bare metal stent 2.5 x 28 and 2.5 x 15 mm stent to the LAD.  DJD in her neck and back  PMH:   has a past medical history of Abnormal Pap smear of cervix, Coronary artery disease, Degenerative disc disease, lumbar, DVT (deep venous thrombosis) ([place]), GERD (gastroesophageal reflux disease), Hyperlipidemia, Hypertension, Macular degeneration, Myocardial infarction ([place]) (2011), Parathyroid adenoma, and PVD (peripheral vascular disease) ([place]).  PSH:    Past Surgical History:  Procedure Laterality Date  . CARDIAC CATHETERIZATION  2013   [facility]  . CATARACT EXTRACTION    . COLONOSCOPY    . COLPOSCOPY    . CORONARY ARTERY BYPASS GRAFT  02/07/2010   x 3  . ERCP N/A 10/05/2019   Procedure: ENDOSCOPIC RETROGRADE CHOLANGIOPANCREATOGRAPHY (ERCP);  Surgeon: Lucilla Lame, MD;  Location: [facility] ENDOSCOPY;  Service: Endoscopy;  Laterality: N/A;  . FACIAL COSMETIC SURGERY  2013   Dr. Nadeen Landau  . FOOT SURGERY     right  . ILIO-FEMORAL BYPASS GRAFT  2011  . LIVER BIOPSY N/A 10/31/2019  Procedure: LIVER BIOPSY;  Surgeon: Olean Ree, MD;  Location: [facility] ORS;  Service: General;  Laterality: N/A;  . MITRAL VALVE REPAIR  01/2010  . ORIF ANKLE FRACTURE Right 10/08/2016   Procedure: OPEN REDUCTION INTERNAL FIXATION (ORIF) ANKLE FRACTURE;  Surgeon: Corky Mull, MD;  Location: [facility] ORS;  Service: Orthopedics;  Laterality: Right;  . ORIF HUMERUS FRACTURE Right 12/20/2019   Procedure: OPEN REDUCTION INTERNAL FIXATION (ORIF) DISTAL HUMERUS FRACTURE;  Surgeon:  Shona Needles, MD;  [place];  Service: Orthopedics;  Laterality: Right;  . ORIF WRIST FRACTURE Right 12/29/2018   Procedure: OPEN REDUCTION INTERNAL FIXATION (ORIF) WRIST FRACTURE;  Surgeon: Corky Mull, MD;  Location: [facility] ORS;  Service: Orthopedics;  Laterality: Right;  . PARATHYROIDECTOMY    . TUBAL LIGATION      Current Outpatient Medications  Medication Sig Dispense Refill  . aspirin 81 MG EC tablet Take 81 mg by mouth daily.      Marland Kitchen BESIVANCE 0.6 % SUSP Place 4 drops into the left eye See admin instructions. Instill 4 drop into the left eye on the day of and the day after eye injection    . Cholecalciferol (VITAMIN D3) 1000 UNITS CAPS Take 1,000 Units by mouth daily.    . cyclobenzaprine (FLEXERIL) 5 MG tablet Take 1 tablet (5 mg total) by mouth 3 (three) times daily as needed for muscle spasms. 20 tablet 0  . doxylamine, Sleep, (SLEEP AID) 25 MG tablet Take 25 mg by mouth at bedtime as needed.     . ezetimibe (ZETIA) 10 MG tablet TAKE 1 TABLET BY MOUTH ONCE DAILY 90 tablet 3  . furosemide (LASIX) 20 MG tablet Take 1 tablet (20 mg total) by mouth daily as needed. 30 tablet 1  . metoprolol tartrate (LOPRESSOR) 25 MG tablet Take 0.5 tablets (12.5 mg total) by mouth 2 (two) times daily. 90 tablet 3  . Multiple Vitamins-Minerals (PRESERVISION AREDS 2 PO) Take 1 capsule by mouth 2 (two) times daily.    . Oxycodone HCl 10 MG TABS Take 10 mg by mouth 3 (three) times daily as needed.    . pantoprazole (PROTONIX) 40 MG tablet Take 1 tablet (40 mg total) by mouth daily. 90 tablet 4  . Probiotic Product (RISA-BID PROBIOTIC PO) Take 250 mg by mouth daily.     . rosuvastatin (CRESTOR) 40 MG tablet Take 1 tablet (40 mg total) by mouth at bedtime. 90 tablet 3   No current facility-administered medications for this visit.    Allergies:   Patient has no known allergies.   Social History:  The patient  reports that she quit smoking about 11 years ago. Her smoking use included cigarettes.  She has a 44.00 pack-year smoking history. She has never used smokeless tobacco. She reports current alcohol use of about 1.0 standard drink of alcohol per week. She reports that she does not use drugs.   Family History:   family history includes Heart disease in her father.    Review of Systems: Review of Systems  Constitutional: Negative.   HENT: Negative.   Respiratory: Negative.   Cardiovascular: Positive for leg swelling.  Gastrointestinal: Negative.   Musculoskeletal: Positive for back pain.  Neurological: Negative.   Psychiatric/Behavioral: Negative.   All other systems reviewed and are negative.   PHYSICAL EXAM: VS:  BP 97/60   Pulse 85   Ht 5' (1.524 m)   Wt 115 lb (52.2 kg)   BMI 22.46 kg/m  , BMI Body mass index  is 22.46 kg/m. Constitutional:  oriented to person, place, and time. No distress.  HENT:  Head: Grossly normal Eyes:  no discharge. No scleral icterus.  Neck: No JVD, no carotid bruits  Cardiovascular: Regular rate and rhythm, no murmurs appreciated Trace swelling on left leg Pulmonary/Chest: Clear to auscultation bilaterally, no wheezes or rales Abdominal: Soft.  no distension.  no tenderness.  Musculoskeletal: Normal range of motion Neurological:  normal muscle tone. Coordination normal. No atrophy Skin: Skin warm and dry Psychiatric: normal affect, pleasant  Recent Labs: 12/20/2019: Hemoglobin 11.3; Platelets 223 03/22/2020: ALT 19 06/18/2020: BUN 20; Creatinine, Ser 0.72; Potassium 4.0; Sodium 136    Lipid Panel Lab Results  Component Value Date   CHOL 219 (H) 03/22/2020   HDL 91 03/22/2020   LDLCALC 118 (H) 03/22/2020   TRIG 48 03/22/2020      Wt Readings from Last 3 Encounters:  08/06/20 115 lb (52.2 kg)  06/19/20 123 lb 6.4 oz (56 kg)  06/03/20 133 lb (60.3 kg)      ASSESSMENT AND PLAN:  Mixed hyperlipidemia - Plan: EKG 12-Lead Recommend she continue on Zetia and Crestor Lipids today  Coronary atherosclerosis of autologous vein  bypass graft without angina -  Currently with no symptoms of angina. No further workup at this time. Continue current medication regimen.  PVD (peripheral vascular disease) ([place]) -  Lipids today  Essential hypertension, benign -  Blood pressure is well controlled on today's visit. No changes made to the medications.  Leg edema left leg Rare lasix, high fluid intake  Suggests comperssion hose, more lasix PRN  Class 1 obesity due to excess calories without serious comorbidity with body mass index (BMI) of 33.0 to 33.9 in adult Weight way down, not eating well   Total encounter time more than 25 minutes  Greater than 50% was spent in counseling and coordination of care with the patient    Orders Placed This Encounter  Procedures  . EKG 12-Lead    Signed, Esmond Plants, M.D., Ph.D. 08/06/2020  [place], [place]

## 2020-08-06 ENCOUNTER — Other Ambulatory Visit: Payer: Self-pay

## 2020-08-06 ENCOUNTER — Encounter: Payer: Self-pay | Admitting: Cardiovascular Disease

## 2020-08-06 ENCOUNTER — Ambulatory Visit: Payer: Medicare HMO | Admitting: Cardiovascular Disease

## 2020-08-06 VITALS — BP 97/60 | HR 85 | Ht 60.0 in | Wt 115.0 lb

## 2020-08-06 DIAGNOSIS — I739 Peripheral vascular disease, unspecified: Secondary | ICD-10-CM | POA: Diagnosis not present

## 2020-08-06 DIAGNOSIS — Z87891 Personal history of nicotine dependence: Secondary | ICD-10-CM

## 2020-08-06 DIAGNOSIS — I25118 Atherosclerotic heart disease of native coronary artery with other forms of angina pectoris: Secondary | ICD-10-CM

## 2020-08-06 DIAGNOSIS — E785 Hyperlipidemia, unspecified: Secondary | ICD-10-CM | POA: Diagnosis not present

## 2020-08-06 DIAGNOSIS — I10 Essential (primary) hypertension: Secondary | ICD-10-CM

## 2020-08-06 NOTE — Patient Instructions (Addendum)
Medication Instructions:  No changes  If you need a refill on your cardiac medications before your next appointment, please call your pharmacy.    Lab work: Lipids, LFTs   LABS WILL APPEAR ON MYCHART, ABNORMAL RESULTS WILL BE CALLED  Testing/Procedures: No new testing needed   Follow-Up:  . You will need a follow up appointment in 12 months  . Providers on your designated Care Team:   . Murray Hodgkins, NP . Christell Faith, PA-C . Marrianne Mood, PA-C  COVID-19 Vaccine Information can be found at: ShippingScam.co.uk For questions related to vaccine distribution or appointments, please email [email or web address] or call [phone].

## 2020-08-07 LAB — LIPID PANEL
Chol/HDL Ratio: 1.9 ratio (ref 0.0–4.4)
Cholesterol, Total: 67 mg/dL — ABNORMAL LOW (ref 100–199)
HDL: 36 mg/dL — ABNORMAL LOW (ref >39)
LDL Chol Calc (NIH): 17 mg/dL (ref 0–99)
Triglycerides: 60 mg/dL (ref 0–149)
VLDL Cholesterol Cal: 14 mg/dL (ref 5–40)

## 2020-08-07 LAB — HEPATIC FUNCTION PANEL
ALT: 82 IU/L — ABNORMAL HIGH (ref 0–32)
AST: 176 IU/L — ABNORMAL HIGH (ref 0–40)
Albumin: 4.1 g/dL (ref 3.7–4.7)
Alkaline Phosphatase: 108 IU/L (ref 44–121)
Bilirubin Total: 0.4 mg/dL (ref 0.0–1.2)
Bilirubin, Direct: 0.22 mg/dL (ref 0.00–0.40)
Total Protein: 6.1 g/dL (ref 6.0–8.5)

## 2020-08-12 ENCOUNTER — Other Ambulatory Visit: Payer: Self-pay | Admitting: *Deleted

## 2020-08-14 ENCOUNTER — Encounter: Payer: Self-pay | Admitting: Family

## 2020-08-15 ENCOUNTER — Encounter (INDEPENDENT_AMBULATORY_CARE_PROVIDER_SITE_OTHER): Payer: Medicare HMO | Admitting: Ophthalmology

## 2020-08-16 ENCOUNTER — Encounter (INDEPENDENT_AMBULATORY_CARE_PROVIDER_SITE_OTHER): Payer: Medicare HMO | Admitting: Ophthalmology

## 2020-08-16 ENCOUNTER — Telehealth (INDEPENDENT_AMBULATORY_CARE_PROVIDER_SITE_OTHER): Payer: Medicare HMO | Admitting: Family

## 2020-08-16 ENCOUNTER — Encounter: Payer: Self-pay | Admitting: Family

## 2020-08-16 VITALS — Ht 60.0 in | Wt 116.0 lb

## 2020-08-16 DIAGNOSIS — F419 Anxiety disorder, unspecified: Secondary | ICD-10-CM

## 2020-08-16 MED ORDER — BUSPIRONE HCL 7.5 MG PO TABS: 7.5000 mg | ORAL_TABLET | Freq: Two times a day (BID) | ORAL | 2 refills | Status: AC

## 2020-08-16 NOTE — Assessment & Plan Note (Signed)
Uncontrolled. Trial of buspar. Close follow up.

## 2020-08-16 NOTE — Progress Notes (Signed)
Verbal consent for services obtained from patient prior to services given to TELEPHONE visit:   Location of call:  provider at work patient at home  Names of all persons present for services: [name], NP and patient Chief complaint: worsening anxiety.    More anxiety as 'home a lot' and limited in regards to going out due to low back pain Tried cymbalta without relief.  No panic attacks. Sleeping well. She feels anxiety 'makes her stomach in knots' . Endorses decreased appetite.  No si/hi.  Chronic low back pain- follows with Dr Alfonso Ramus. Compliant with oxycodone 10mg   q6 hours with relief, had been on hydrocodone in the past and discontinued due to elevated liver enzymes.   Liver enzymes elevated. Holding crestor currently. Rechecking liver enzymes 10/08/20.  No depression  A/P/next steps:  Problem List Items Addressed This Visit      Other   Anxiety - Primary    Uncontrolled. Trial of buspar. Close follow up.       Relevant Medications   busPIRone (BUSPAR) 7.5 MG tablet       I spent 10 min  discussing plan of care over the phone.

## 2020-08-20 ENCOUNTER — Encounter (INDEPENDENT_AMBULATORY_CARE_PROVIDER_SITE_OTHER): Payer: Medicare HMO | Admitting: Ophthalmology

## 2020-08-20 ENCOUNTER — Other Ambulatory Visit: Payer: Self-pay

## 2020-08-20 DIAGNOSIS — H353231 Exudative age-related macular degeneration, bilateral, with active choroidal neovascularization: Secondary | ICD-10-CM

## 2020-08-20 DIAGNOSIS — H43813 Vitreous degeneration, bilateral: Secondary | ICD-10-CM

## 2020-08-20 DIAGNOSIS — H35372 Puckering of macula, left eye: Secondary | ICD-10-CM | POA: Diagnosis not present

## 2020-08-20 DIAGNOSIS — I10 Essential (primary) hypertension: Secondary | ICD-10-CM | POA: Diagnosis not present

## 2020-08-20 DIAGNOSIS — H35033 Hypertensive retinopathy, bilateral: Secondary | ICD-10-CM | POA: Diagnosis not present

## 2020-08-21 ENCOUNTER — Encounter: Payer: Self-pay | Admitting: Family

## 2020-08-26 ENCOUNTER — Encounter: Payer: Self-pay | Admitting: Family

## 2020-09-15 ENCOUNTER — Encounter: Payer: Self-pay | Admitting: Family

## 2020-09-17 ENCOUNTER — Encounter: Payer: Self-pay | Admitting: Family

## 2020-09-17 NOTE — Telephone Encounter (Signed)
LMTCB

## 2020-09-17 NOTE — Telephone Encounter (Signed)
PT called to return call

## 2020-09-18 ENCOUNTER — Encounter: Payer: Self-pay | Admitting: Family

## 2020-09-18 NOTE — Telephone Encounter (Signed)
Patient called office and rescheduled to 09/20/20 because of no transportion.

## 2020-09-19 ENCOUNTER — Encounter: Payer: Self-pay | Admitting: Family

## 2020-09-19 ENCOUNTER — Ambulatory Visit: Payer: Medicare HMO | Admitting: Adult Health

## 2020-09-19 NOTE — Telephone Encounter (Signed)
[name] spoke with pt yesterday and scheduled pt with Sharyn Lull, NP for tomorrow. See previous mychart message.

## 2020-09-20 ENCOUNTER — Ambulatory Visit (INDEPENDENT_AMBULATORY_CARE_PROVIDER_SITE_OTHER): Payer: Medicare HMO | Admitting: Family

## 2020-09-20 ENCOUNTER — Encounter: Payer: Self-pay | Admitting: Family

## 2020-09-20 ENCOUNTER — Other Ambulatory Visit: Payer: Self-pay

## 2020-09-20 VITALS — BP 104/52 | HR 76

## 2020-09-20 DIAGNOSIS — E782 Mixed hyperlipidemia: Secondary | ICD-10-CM

## 2020-09-20 DIAGNOSIS — G8929 Other chronic pain: Secondary | ICD-10-CM

## 2020-09-20 DIAGNOSIS — M5136 Other intervertebral disc degeneration, lumbar region: Secondary | ICD-10-CM | POA: Diagnosis not present

## 2020-09-20 DIAGNOSIS — F419 Anxiety disorder, unspecified: Secondary | ICD-10-CM | POA: Diagnosis not present

## 2020-09-20 DIAGNOSIS — I10 Essential (primary) hypertension: Secondary | ICD-10-CM

## 2020-09-20 DIAGNOSIS — R748 Abnormal levels of other serum enzymes: Secondary | ICD-10-CM | POA: Diagnosis not present

## 2020-09-20 DIAGNOSIS — M545 Low back pain, unspecified: Secondary | ICD-10-CM

## 2020-09-20 DIAGNOSIS — G629 Polyneuropathy, unspecified: Secondary | ICD-10-CM

## 2020-09-20 MED ORDER — BUSPIRONE HCL 5 MG PO TABS: 5.0000 mg | ORAL_TABLET | Freq: Three times a day (TID) | ORAL | 1 refills | Status: DC | PRN
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
## 2020-09-20 NOTE — Patient Instructions (Signed)
Referral to Dr Lynford Humphrey, home health for physical therapy and MRI lumbar  Let us know if you dont hear back within a week in regards to an appointment being scheduled.    Let me know of any concerns

## 2020-09-20 NOTE — Addendum Note (Signed)
Addended by: Leeanne Rio on: 09/20/2020 02:53 PM   Modules accepted: Orders

## 2020-09-20 NOTE — Assessment & Plan Note (Signed)
If LFTS normal, will resume crestor 20mg . Continue zetia 10mg .

## 2020-09-21 LAB — COMPREHENSIVE METABOLIC PANEL
AG Ratio: 2.2 (calc) (ref 1.0–2.5)
ALT: 19 U/L (ref 6–29)
AST: 18 U/L (ref 10–35)
Albumin: 4.3 g/dL (ref 3.6–5.1)
Alkaline phosphatase (APISO): 69 U/L (ref 37–153)
BUN: 24 mg/dL (ref 7–25)
CO2: 23 mmol/L (ref 20–32)
Calcium: 9.8 mg/dL (ref 8.6–10.4)
Chloride: 101 mmol/L (ref 98–110)
Creat: 0.69 mg/dL (ref 0.60–0.93)
Globulin: 2 g/dL (calc) (ref 1.9–3.7)
Glucose, Bld: 113 mg/dL — ABNORMAL HIGH (ref 65–99)
Potassium: 5 mmol/L (ref 3.5–5.3)
Sodium: 136 mmol/L (ref 135–146)
Total Bilirubin: 0.4 mg/dL (ref 0.2–1.2)
Total Protein: 6.3 g/dL (ref 6.1–8.1)

## 2020-09-21 LAB — HEMOGLOBIN A1C
Hgb A1c MFr Bld: 4.8 % of total Hgb (ref <5.7)
Mean Plasma Glucose: 91 mg/dL
eAG (mmol/L): 5 mmol/L

## 2020-09-21 LAB — TSH: TSH: 1.96 mIU/L (ref 0.40–4.50)

## 2020-09-21 LAB — LIPID PANEL
Cholesterol: 170 mg/dL (ref <200)
HDL: 67 mg/dL (ref >=50)
LDL Cholesterol (Calc): 84 mg/dL (calc)
Non-HDL Cholesterol (Calc): 103 mg/dL (calc) (ref <130)
Total CHOL/HDL Ratio: 2.5 (calc) (ref <5.0)
Triglycerides: 95 mg/dL (ref <150)

## 2020-09-21 LAB — B12 AND FOLATE PANEL
Folate: 2.9 ng/mL — ABNORMAL LOW
Vitamin B-12: 518 pg/mL (ref 200–1100)

## 2020-09-23 ENCOUNTER — Other Ambulatory Visit: Payer: Self-pay | Admitting: Family

## 2020-09-23 DIAGNOSIS — F419 Anxiety disorder, unspecified: Secondary | ICD-10-CM

## 2020-09-23 MED ORDER — BUSPIRONE HCL 7.5 MG PO TABS: 7.5000 mg | ORAL_TABLET | Freq: Three times a day (TID) | ORAL | 1 refills | Status: DC | PRN

## 2020-09-23 NOTE — Telephone Encounter (Signed)
Please advise on medication dose change

## 2020-09-24 ENCOUNTER — Encounter (INDEPENDENT_AMBULATORY_CARE_PROVIDER_SITE_OTHER): Payer: Medicare HMO | Admitting: Ophthalmology

## 2020-09-24 ENCOUNTER — Other Ambulatory Visit: Payer: Self-pay

## 2020-09-24 DIAGNOSIS — I10 Essential (primary) hypertension: Secondary | ICD-10-CM | POA: Diagnosis not present

## 2020-09-24 DIAGNOSIS — H35372 Puckering of macula, left eye: Secondary | ICD-10-CM

## 2020-09-24 DIAGNOSIS — H35033 Hypertensive retinopathy, bilateral: Secondary | ICD-10-CM

## 2020-09-24 DIAGNOSIS — H43813 Vitreous degeneration, bilateral: Secondary | ICD-10-CM

## 2020-09-24 DIAGNOSIS — H353231 Exudative age-related macular degeneration, bilateral, with active choroidal neovascularization: Secondary | ICD-10-CM | POA: Diagnosis not present

## 2020-09-24 NOTE — Assessment & Plan Note (Signed)
Distal upper and lower neuropathy. B12, TSH, A1c are normal and not likely to be contributory to neuropathy.  Will await MRI lumbar , orthopedic consult. Follow closely and consider neurology referral at follow up.

## 2020-09-24 NOTE — Assessment & Plan Note (Addendum)
Chronic. Pain has unchanged however symptoms and unsteadiness when walking concerning for stenosis. Pending MRI lumbar in setting of distal neuropathy and weakness. Referral for home PT as she unable to safely leave the house and drive. She would like a second opinion regarding low back pain with Dr Charlotte Sanes and referral placed. She will continue to follow with Dr Alfonso Ramus for oxycodone until she can establish with Dr Lynford Humphrey.  Follow up with me 10/30/20.

## 2020-09-24 NOTE — Assessment & Plan Note (Signed)
Uncontrolled. Increase buspar to 7.5mg  TID

## 2020-09-24 NOTE — Assessment & Plan Note (Signed)
Controlled. Continue metoprolol tartrate 25mg  bid

## 2020-09-26 ENCOUNTER — Encounter: Payer: Self-pay | Admitting: Family

## 2020-09-27 ENCOUNTER — Other Ambulatory Visit: Payer: Self-pay

## 2020-09-27 ENCOUNTER — Other Ambulatory Visit: Payer: Self-pay | Admitting: Family

## 2020-09-27 MED ORDER — ROSUVASTATIN CALCIUM 10 MG PO TABS: 10.0000 mg | ORAL_TABLET | Freq: Every day | ORAL | 0 refills | Status: DC

## 2020-09-27 NOTE — Telephone Encounter (Signed)
Patient scheduled for VV to discuss starting Gabapentin on Monday.

## 2020-09-29 NOTE — Progress Notes (Signed)
Virtual Visit via Video Note  I connected with@  on 10/06/20 at  1:00 PM EDT by a video enabled telemedicine application and verified that I am speaking with the correct person using two identifiers.  Location patient: home Location provider:work  Persons participating in the virtual visit: patient, provider  I discussed the limitations of evaluation and management by telemedicine and the availability of in person appointments. The patient expressed understanding and agreed to proceed.   HPI: Follow up low back pain, mid central low back.   She complains of numbness bilateral feet. Numbness extends from bilateral ankle and up to the knees, she feels is has extending towards knee. No numbness in buttocks. No saddle anesthesia, trouble urinating or having a bowel movement.   Numbness in hands is intermittent and less bothersome. No neck pain.  Using rolling walker. She is taking flexeril in months.   She is interested in gabapentin and cymbalta. No alcohol use.   No falls since last visit.   She is doing exercises that she learned at [facility]. She now has physical therapist through [facility] coming to her home since she has stopped driving. She is able to to bathe, dress.   She has an appointment with Olena Mater tomorrow and declines imaging at our office as she would like to have done with orthopedic.   Anxiety- comes and goes. she is not taking buspar 7.5 TID as didn't want to add more pills at this time. She would like to discuss this in the future.   She plans to restart crestor 10mg  and will start this week.LFTs WNL  09/20/20.        ROS: See pertinent positives and negatives per HPI.    EXAM:  VITALS per patient if applicable: Ht 5' (0.626 m)   Wt 116 lb (52.6 kg)   BMI 22.65 kg/m  BP Readings from Last 3 Encounters:  09/20/20 (!) 104/52  08/06/20 97/60  04/03/20 124/64   Wt Readings from Last 3 Encounters:  09/30/20 116 lb (52.6 kg)  08/16/20 116 lb (52.6  kg)  08/06/20 115 lb (52.2 kg)    GENERAL: alert, oriented, appears well and in no acute distress  HEENT: atraumatic, conjunttiva clear, no obvious abnormalities on inspection of external nose and ears  NECK: normal movements of the head and neck  LUNGS: on inspection no signs of respiratory distress, breathing rate appears normal, no obvious gross SOB, gasping or wheezing  CV: no obvious cyanosis  MS: moves all visible extremities without noticeable abnormality  PSYCH/NEURO: pleasant and cooperative, no obvious depression or anxiety, speech and thought processing grossly intact  ASSESSMENT AND PLAN:  Discussed the following assessment and plan:  Problem List Items Addressed This Visit       Musculoskeletal and Integument   DDD (degenerative disc disease), lumbar    Chronic, worsening. Patient unable to drive. Advised xray of entire spine today however she would like to pursue imaging with orthopedics. She is doing PT at this time. TSH, A1c, and b12 normal. Start and titrate gabapentin. Close follow up.          Other   Anxiety    Chronic, stable at this time. Discussed re-trial of cymbalta, will start after gabapentin dose stable.        Chronic back pain - Primary    Chronic.  Appointment with [facility] tomorrow and declines xrays in our office.  Will start gabapentin.   She Pt MRI lumbar spine wo contrast denied due  to:   Pt needing detailed nerve sys exam for current symptom  6 weeks of provider treatment PT or medication/ inj  Prior imaging xray  Re eval after completing treatments   All within the last 3 months         Relevant Medications   gabapentin (NEURONTIN) 100 MG capsule   Other Relevant Orders   DME Wheelchair manual   Hyperlipidemia    Uncontrolled. Restart at lower dose of crestor 10mg  . Monitor LFTs closely. CMP in 6 weeks time        Relevant Orders   Hepatic function panel    -we discussed possible serious and likely etiologies,  options for evaluation and workup, limitations of telemedicine visit vs in person visit, treatment, treatment risks and precautions. Pt prefers to treat via telemedicine empirically rather then risking or undertaking an in person visit at this moment.  .   I discussed the assessment and treatment plan with the patient. The patient was provided an opportunity to ask questions and all were answered. The patient agreed with the plan and demonstrated an understanding of the instructions.   The patient was advised to call back or seek an in-person evaluation if the symptoms worsen or if the condition fails to improve as anticipated.   [name], FNP

## 2020-09-30 ENCOUNTER — Telehealth: Payer: Self-pay | Admitting: Family

## 2020-09-30 ENCOUNTER — Other Ambulatory Visit: Payer: Self-pay

## 2020-09-30 ENCOUNTER — Ambulatory Visit: Payer: Medicare HMO | Admitting: Family

## 2020-09-30 ENCOUNTER — Ambulatory Visit (INDEPENDENT_AMBULATORY_CARE_PROVIDER_SITE_OTHER): Payer: Medicare HMO | Admitting: Family

## 2020-09-30 VITALS — Ht 60.0 in | Wt 116.0 lb

## 2020-09-30 DIAGNOSIS — F419 Anxiety disorder, unspecified: Secondary | ICD-10-CM

## 2020-09-30 DIAGNOSIS — M545 Low back pain, unspecified: Secondary | ICD-10-CM

## 2020-09-30 DIAGNOSIS — G8929 Other chronic pain: Secondary | ICD-10-CM

## 2020-09-30 DIAGNOSIS — E782 Mixed hyperlipidemia: Secondary | ICD-10-CM

## 2020-09-30 DIAGNOSIS — M5136 Other intervertebral disc degeneration, lumbar region: Secondary | ICD-10-CM

## 2020-09-30 MED ORDER — GABAPENTIN 100 MG PO CAPS: 100.0000 mg | ORAL_CAPSULE | Freq: Three times a day (TID) | ORAL | 3 refills | Status: DC

## 2020-09-30 NOTE — Assessment & Plan Note (Signed)
Chronic.  Appointment with [facility] tomorrow and declines xrays in our office.  Will start gabapentin.   She Pt MRI lumbar spine wo contrast denied due to:   Pt needing detailed nerve sys exam for current symptom  6 weeks of provider treatment PT or medication/ inj  Prior imaging xray  Re eval after completing treatments   All within the last 3 months

## 2020-09-30 NOTE — Progress Notes (Signed)
Documented in appt notes CMP need ed 7/13.

## 2020-09-30 NOTE — Telephone Encounter (Signed)
Please call [facility]  Pt has appt tomorrow at [facility] ortho I had ordered MRI lumbar which was denied by insurance as told that she would need lumbar XR prior to MRI being approved.   Please ask [facility] to order xrays tomorrow

## 2020-09-30 NOTE — Patient Instructions (Signed)
Start gabapentin 100mg  twice per day and then increase to three times per day  Do not take wit alcohol, flexeril ( muscle relaxant)  Speak with [facility] regarding need for Xrays in order to have MRI lumbar spine in 6 weeks time

## 2020-10-01 ENCOUNTER — Encounter: Payer: Self-pay | Admitting: Family

## 2020-10-02 ENCOUNTER — Telehealth: Payer: Self-pay | Admitting: Family

## 2020-10-02 ENCOUNTER — Other Ambulatory Visit: Payer: Self-pay

## 2020-10-02 DIAGNOSIS — M5136 Other intervertebral disc degeneration, lumbar region: Secondary | ICD-10-CM

## 2020-10-02 NOTE — Telephone Encounter (Signed)
I let patient know was mailed and she will let us know that she does not receive. She will check into medical supply store that can supply.

## 2020-10-02 NOTE — Telephone Encounter (Signed)
Noted Dme mailed 2 days ago

## 2020-10-02 NOTE — Telephone Encounter (Signed)
I let patient know that this had been dc'd & patient has not been taking.  Yesterday she saw Carlynn Spry Physician Assistant   Pt did say she saw [facility] & they did her xrays. They basically showed what she already knew the bone on bone disc in her back (DDD). She said that he reccommended injections & possibly surgery. She hopes that with xrays & PT that we can get MRI approved since symptoms came on so suddenly. She is supposed to have her first PT session this afternoon.  Also requested a DME for a wheelchair so that she is able to go someplace. Okay to do for patient?

## 2020-10-02 NOTE — Telephone Encounter (Signed)
Pt called and said that the pharmacy is trying to refill the sertraline (ZOLOFT)  She wanted to know if she was still supposed to take it

## 2020-10-03 ENCOUNTER — Encounter: Payer: Self-pay | Admitting: Family

## 2020-10-06 NOTE — Assessment & Plan Note (Signed)
Chronic, worsening. Patient unable to drive. Advised xray of entire spine today however she would like to pursue imaging with orthopedics. She is doing PT at this time. TSH, A1c, and b12 normal. Start and titrate gabapentin. Close follow up.

## 2020-10-06 NOTE — Assessment & Plan Note (Signed)
Uncontrolled. Restart at lower dose of crestor 10mg  . Monitor LFTs closely. CMP in 6 weeks time

## 2020-10-06 NOTE — Assessment & Plan Note (Signed)
Chronic, stable at this time. Discussed re-trial of cymbalta, will start after gabapentin dose stable.

## 2020-10-08 ENCOUNTER — Ambulatory Visit: Payer: Medicare HMO | Admitting: Cardiovascular Disease

## 2020-10-09 NOTE — Telephone Encounter (Signed)
Pt called to follow up on DME. Pt stated that she hasn't received anything

## 2020-10-09 NOTE — Telephone Encounter (Signed)
LM that I was sending my mail DME that should go out tomorrow. Not sure why she did nit receive that last. I asked that she call back if questions.

## 2020-10-14 NOTE — Telephone Encounter (Signed)
[employer] called to advise [place] of 2 DME suppliers that we can contact. They are:  [employer] [phone] [employer] and [employer] [phone]  They would also like once this has been done for [place] to contact the patient.

## 2020-10-15 ENCOUNTER — Telehealth: Payer: Self-pay | Admitting: Family

## 2020-10-15 ENCOUNTER — Other Ambulatory Visit: Payer: Self-pay | Admitting: Family

## 2020-10-15 NOTE — Telephone Encounter (Signed)
[name] from [employer] and [employer], [phone]. She needs to know if the order for the wheel chair is a simple or complex wheel chair. They do not do simple wheel chairs

## 2020-10-15 NOTE — Telephone Encounter (Signed)
[name] has signed DME & I have faxed to to [employer] at [phone].

## 2020-10-15 NOTE — Telephone Encounter (Signed)
I spoke with [name] & she needed to know if patient needed simple or complex wheel chair. Simple would be like what she would have in our office for transport. I advised that I felt best for them to evaluate patient & see what best fit her needs. Pt was requesting small wheel chair for easier transport & access. I did fax referral notes bc patient did need to have PT before able to receive a wheelchair through them that insurance would cover. I have faxed a copy of the referral. I asked that [name] call back with any other needed information.

## 2020-10-15 NOTE — Telephone Encounter (Signed)
I spoke with [employer] location at [phone]. They advised that I fax them patient's demographics & DME order. They will reach out to patient & have someone come out to do mobility evaluation. I called and made patient aware if this & that they should reach out to her, so try to answer the phone & should come from a number out of [place]. I will fax once DME is signed by Joycelyn Schmid.

## 2020-10-16 ENCOUNTER — Encounter: Payer: Self-pay | Admitting: Family

## 2020-10-25 NOTE — Progress Notes (Deleted)
Subjective:    Patient ID: [name], female    DOB: 1945/10/15, 75 y.o.   MRN: [id number]  CC: [name] is a 75 y.o. female who presents today for follow up.   HPI: Pt MRI lumbar spine wo contrast denied due to:     Pt needing detailed nerve sys exam for current symptom  6 weeks of provider treatment PT or medication/ inj  Prior imaging xray  Re eval after completing treatments     All within the last 3 months        HISTORY:  Past Medical History:  Diagnosis Date   Abnormal Pap smear of cervix    Dr. Prentice Docker    Coronary artery disease    a. 06/2009 MI/PCI to LCX and LAD; b. 01/2011 Occluded stents-->CABG x 3 ([facility]): LIMA->LAD, VG->LCX, VG->RPDA; c. 01/2012 Cath: LM 50, LAD 95ost, 151m, LCX 95ost, 177m, RCA 77m, RPDA small, LIMA->LAD ok, VG->LCX ok, VG->RPDA 100-->Med rx.   Degenerative disc disease, lumbar    L4/5 noted CT ab/pelvis 05/19/11    DVT (deep venous thrombosis) ([place])    after childbirth age 89   GERD (gastroesophageal reflux disease)    Hyperlipidemia    Hypertension    under control   Macular degeneration    Myocardial infarction [facility]) 2011   Parathyroid adenoma    right 13 x 8 mm s/p resection    PVD (peripheral vascular disease) ([place])    a. 01/2010 s/p Aortofemoral bypass.   Past Surgical History:  Procedure Laterality Date   CARDIAC CATHETERIZATION  2013   [facility]   CATARACT EXTRACTION     COLONOSCOPY     COLPOSCOPY     CORONARY ARTERY BYPASS GRAFT  02/07/2010   x 3   ERCP N/A 10/05/2019   Procedure: ENDOSCOPIC RETROGRADE CHOLANGIOPANCREATOGRAPHY (ERCP);  Surgeon: Lucilla Lame, MD;  Location: [facility] ENDOSCOPY;  Service: Endoscopy;  Laterality: N/A;   FACIAL COSMETIC SURGERY  2013   Dr. Nadeen Landau   FOOT SURGERY     right   ILIO-FEMORAL BYPASS GRAFT  2011   LIVER BIOPSY N/A 10/31/2019   Procedure: LIVER BIOPSY;  Surgeon: Olean Ree, MD;  Location: [facility] ORS;  Service: General;  Laterality: N/A;   MITRAL VALVE REPAIR  01/2010    ORIF ANKLE FRACTURE Right 10/08/2016   Procedure: OPEN REDUCTION INTERNAL FIXATION (ORIF) ANKLE FRACTURE;  Surgeon: Corky Mull, MD;  Location: [facility] ORS;  Service: Orthopedics;  Laterality: Right;   ORIF HUMERUS FRACTURE Right 12/20/2019   Procedure: OPEN REDUCTION INTERNAL FIXATION (ORIF) DISTAL HUMERUS FRACTURE;  Surgeon: Shona Needles, MD;  Location: [place];  Service: Orthopedics;  Laterality: Right;   ORIF WRIST FRACTURE Right 12/29/2018   Procedure: OPEN REDUCTION INTERNAL FIXATION (ORIF) WRIST FRACTURE;  Surgeon: Corky Mull, MD;  Location: [facility] ORS;  Service: Orthopedics;  Laterality: Right;   PARATHYROIDECTOMY     TUBAL LIGATION     Family History  Problem Relation Age of Onset   Heart disease Father     Allergies: Patient has no known allergies. Current Outpatient Medications on File Prior to Visit  Medication Sig Dispense Refill   aspirin 81 MG EC tablet Take 81 mg by mouth daily.       BESIVANCE 0.6 % SUSP Place 4 drops into the left eye See admin instructions. Instill 4 drop into the left eye on the day of and the day after eye injection     Cholecalciferol (VITAMIN D3) 1000  UNITS CAPS Take 1,000 Units by mouth daily.     cyclobenzaprine (FLEXERIL) 5 MG tablet Take 1 tablet (5 mg total) by mouth 3 (three) times daily as needed for muscle spasms. 20 tablet 0   doxylamine, Sleep, (SLEEP AID) 25 MG tablet Take 25 mg by mouth at bedtime as needed.      ezetimibe (ZETIA) 10 MG tablet TAKE 1 TABLET BY MOUTH ONCE DAILY 90 tablet 3   furosemide (LASIX) 20 MG tablet Take 1 tablet (20 mg total) by mouth daily as needed. 30 tablet 1   gabapentin (NEURONTIN) 100 MG capsule Take 1 capsule (100 mg total) by mouth 3 (three) times daily. 90 capsule 3   ketorolac (ACULAR) 0.5 % ophthalmic solution SMARTSIG:In Eye(s)     metoprolol tartrate (LOPRESSOR) 25 MG tablet Take 0.5 tablets (12.5 mg total) by mouth 2 (two) times daily. 90 tablet 3   Multiple Vitamins-Minerals (PRESERVISION AREDS 2 PO)  Take 1 capsule by mouth 2 (two) times daily.     Oxycodone HCl 10 MG TABS Take 10 mg by mouth 3 (three) times daily as needed.     pantoprazole (PROTONIX) 40 MG tablet Take 1 tablet (40 mg total) by mouth daily. 90 tablet 4   Probiotic Product (RISA-BID PROBIOTIC PO) Take 250 mg by mouth daily.      rosuvastatin (CRESTOR) 10 MG tablet Take 1 tablet (10 mg total) by mouth daily. 90 tablet 0   trimethoprim-polymyxin b (POLYTRIM) ophthalmic solution SMARTSIG:In Eye(s)     vitamin B-12 (CYANOCOBALAMIN) 1000 MCG tablet Take 1,000 mcg by mouth daily.     No current facility-administered medications on file prior to visit.    Social History   Tobacco Use   Smoking status: Former    Packs/day: 1.00    Years: 44.00    Pack years: 44.00    Types: Cigarettes    Quit date: 07/15/2009    Years since quitting: 11.2   Smokeless tobacco: Never   Tobacco comments:    quit 06/2009 smoked from 51s to 2011 1.5 ppd no FH lung cancer   Vaping Use   Vaping Use: Never used  Substance Use Topics   Alcohol use: Yes    Alcohol/week: 1.0 standard drink    Types: 1 Standard drinks or equivalent per week    Comment: occ.   Drug use: No    Review of Systems    Objective:    There were no vitals taken for this visit. BP Readings from Last 3 Encounters:  09/20/20 (!) 104/52  08/06/20 97/60  04/03/20 124/64   Wt Readings from Last 3 Encounters:  09/30/20 116 lb (52.6 kg)  08/16/20 116 lb (52.6 kg)  08/06/20 115 lb (52.2 kg)    Physical Exam     Assessment & Plan:   Problem List Items Addressed This Visit   None    I am having [name] maintain her aspirin, Vitamin D3, Probiotic Product (RISA-BID PROBIOTIC PO), Multiple Vitamins-Minerals (PRESERVISION AREDS 2 PO), Besivance, Sleep Aid, ezetimibe, metoprolol tartrate, cyclobenzaprine, pantoprazole, furosemide, Oxycodone HCl, ketorolac, trimethoprim-polymyxin b, vitamin B-12, rosuvastatin, and gabapentin.   No orders of the defined types  were placed in this encounter.   Return precautions given.   Risks, benefits, and alternatives of the medications and treatment plan prescribed today were discussed, and patient expressed understanding.   Education regarding symptom management and diagnosis given to patient on AVS.  Continue to follow with Burnard Hawthorne, FNP for routine health maintenance.  [name] and I agreed with plan.   [name], FNP

## 2020-10-25 NOTE — Telephone Encounter (Signed)
[name] from [employer], [phone], called to advise that they do not do standard medical equipment they only do custom equipment.

## 2020-10-25 NOTE — Telephone Encounter (Signed)
LMTCB

## 2020-10-30 ENCOUNTER — Ambulatory Visit: Payer: Medicare HMO | Admitting: Family

## 2020-10-31 ENCOUNTER — Encounter (INDEPENDENT_AMBULATORY_CARE_PROVIDER_SITE_OTHER): Payer: Medicare HMO | Admitting: Ophthalmology

## 2020-10-31 ENCOUNTER — Other Ambulatory Visit: Payer: Self-pay

## 2020-10-31 DIAGNOSIS — H35033 Hypertensive retinopathy, bilateral: Secondary | ICD-10-CM | POA: Diagnosis not present

## 2020-10-31 DIAGNOSIS — H43813 Vitreous degeneration, bilateral: Secondary | ICD-10-CM

## 2020-10-31 DIAGNOSIS — I10 Essential (primary) hypertension: Secondary | ICD-10-CM | POA: Diagnosis not present

## 2020-10-31 DIAGNOSIS — H353231 Exudative age-related macular degeneration, bilateral, with active choroidal neovascularization: Secondary | ICD-10-CM

## 2020-11-01 ENCOUNTER — Telehealth: Payer: Self-pay

## 2020-11-01 ENCOUNTER — Other Ambulatory Visit: Payer: Self-pay

## 2020-11-01 ENCOUNTER — Telehealth: Payer: Self-pay | Admitting: Cardiovascular Disease

## 2020-11-01 MED ORDER — EZETIMIBE 10 MG PO TABS: 10.0000 mg | ORAL_TABLET | Freq: Every day | ORAL | 2 refills | Status: DC

## 2020-11-01 MED ORDER — METOPROLOL TARTRATE 25 MG PO TABS: 12.5000 mg | ORAL_TABLET | Freq: Two times a day (BID) | ORAL | 3 refills | Status: DC

## 2020-11-01 NOTE — Telephone Encounter (Signed)
FYI patient called and stated that [facility] has delivered her the sertraline 50mg  tablets. She had not been taking & wants to start back. She feels since buspar was d/c that this may help with anxiety. I have added back to her chart. You prescribed in the past.

## 2020-11-01 NOTE — Addendum Note (Signed)
Addended by: Britt Bottom on: 11/01/2020 03:20 PM   Modules accepted: Orders

## 2020-11-01 NOTE — Telephone Encounter (Signed)
Pt called and wanted to speak with you. She states that it is about her medications and she wants to make sure she is taking them correctly. She did not give specific names of the medications.

## 2020-11-01 NOTE — Telephone Encounter (Signed)
Requested Prescriptions   Signed Prescriptions Disp Refills   metoprolol tartrate (LOPRESSOR) 25 MG tablet 90 tablet 3    Sig: Take 0.5 tablets (12.5 mg total) by mouth 2 (two) times daily.    Authorizing Provider: Minna Merritts    Ordering User: Britt Bottom

## 2020-11-01 NOTE — Telephone Encounter (Signed)
*  STAT* If patient is at the pharmacy, call can be transferred to refill team.   1. Which medications need to be refilled? (please list name of each medication and dose if known) metoprolol tartrate 25 MG 0.5MG  2 times daily   2. Which pharmacy/location (including street and city if local pharmacy) is medication to be sent to? [facility]  3. Do they need a 30 day or 90 day supply? 90 day

## 2020-11-01 NOTE — Telephone Encounter (Signed)
*  STAT* If patient is at the pharmacy, call can be transferred to refill team.   1. Which medications need to be refilled? (please list name of each medication and dose if known) ezetimibe (ZETIA) 10 MG 1 tablet daily   2. Which pharmacy/location (including street and city if local pharmacy) is medication to be sent to? [facility]  3. Do they need a 30 day or 90 day supply? 90 day

## 2020-11-01 NOTE — Telephone Encounter (Signed)
Pt will call [employer] & see where she could possibly get just a standard wheelchair covered.

## 2020-11-04 NOTE — Telephone Encounter (Signed)
[name] , call pt . Advise NOT to start zoloft yet. Worried about a particular interval in her EKG , QT. I want to consult with Dr Rockey Situ first. Let her know we will call her back in a couple of days.      Dr Rockey Situ,  [facility] you are well.   I would like to start cymbalta 30mg  .  Patient had been on zoloft in the past 50mg  however last EKG in April  shows QTc 485.  Would you prefer cymbalta?   IF IF zoloft was even okay with you, how do I ( how frequently) would I monitor for worsening of QT prolongation? I would presume to stay at lowest dose 50mg ?

## 2020-11-04 NOTE — Telephone Encounter (Signed)
LMTCB

## 2020-11-07 ENCOUNTER — Other Ambulatory Visit: Payer: Self-pay

## 2020-11-07 ENCOUNTER — Inpatient Hospital Stay
Admission: EM | Admit: 2020-11-07 | Discharge: 2020-11-18 | DRG: 641 | Disposition: A | Discharge status: Skilled Nursing Facility | Payer: Medicare HMO | Attending: Internal Medicine | Admitting: Internal Medicine

## 2020-11-07 ENCOUNTER — Encounter: Payer: Self-pay | Admitting: Intensive Care

## 2020-11-07 ENCOUNTER — Emergency Department: Payer: Medicare HMO

## 2020-11-07 DIAGNOSIS — R531 Weakness: Secondary | ICD-10-CM | POA: Diagnosis not present

## 2020-11-07 DIAGNOSIS — Z86718 Personal history of other venous thrombosis and embolism: Secondary | ICD-10-CM

## 2020-11-07 DIAGNOSIS — I2510 Atherosclerotic heart disease of native coronary artery without angina pectoris: Secondary | ICD-10-CM | POA: Diagnosis present

## 2020-11-07 DIAGNOSIS — Z7982 Long term (current) use of aspirin: Secondary | ICD-10-CM

## 2020-11-07 DIAGNOSIS — R262 Difficulty in walking, not elsewhere classified: Secondary | ICD-10-CM | POA: Diagnosis not present

## 2020-11-07 DIAGNOSIS — D529 Folate deficiency anemia, unspecified: Secondary | ICD-10-CM | POA: Diagnosis present

## 2020-11-07 DIAGNOSIS — Z20822 Contact with and (suspected) exposure to covid-19: Secondary | ICD-10-CM | POA: Diagnosis present

## 2020-11-07 DIAGNOSIS — M545 Low back pain, unspecified: Secondary | ICD-10-CM

## 2020-11-07 DIAGNOSIS — G63 Polyneuropathy in diseases classified elsewhere: Secondary | ICD-10-CM

## 2020-11-07 DIAGNOSIS — Z87891 Personal history of nicotine dependence: Secondary | ICD-10-CM

## 2020-11-07 DIAGNOSIS — M549 Dorsalgia, unspecified: Secondary | ICD-10-CM | POA: Diagnosis present

## 2020-11-07 DIAGNOSIS — H3530 Unspecified macular degeneration: Secondary | ICD-10-CM | POA: Diagnosis present

## 2020-11-07 DIAGNOSIS — R202 Paresthesia of skin: Secondary | ICD-10-CM

## 2020-11-07 DIAGNOSIS — E785 Hyperlipidemia, unspecified: Secondary | ICD-10-CM | POA: Diagnosis present

## 2020-11-07 DIAGNOSIS — R278 Other lack of coordination: Secondary | ICD-10-CM | POA: Diagnosis present

## 2020-11-07 DIAGNOSIS — I252 Old myocardial infarction: Secondary | ICD-10-CM

## 2020-11-07 DIAGNOSIS — E610 Copper deficiency: Secondary | ICD-10-CM | POA: Diagnosis not present

## 2020-11-07 DIAGNOSIS — G8929 Other chronic pain: Secondary | ICD-10-CM

## 2020-11-07 DIAGNOSIS — E860 Dehydration: Secondary | ICD-10-CM | POA: Diagnosis present

## 2020-11-07 DIAGNOSIS — G992 Myelopathy in diseases classified elsewhere: Secondary | ICD-10-CM

## 2020-11-07 DIAGNOSIS — Z8673 Personal history of transient ischemic attack (TIA), and cerebral infarction without residual deficits: Secondary | ICD-10-CM

## 2020-11-07 DIAGNOSIS — Z951 Presence of aortocoronary bypass graft: Secondary | ICD-10-CM

## 2020-11-07 DIAGNOSIS — G629 Polyneuropathy, unspecified: Secondary | ICD-10-CM | POA: Diagnosis present

## 2020-11-07 DIAGNOSIS — Z79899 Other long term (current) drug therapy: Secondary | ICD-10-CM

## 2020-11-07 DIAGNOSIS — E861 Hypovolemia: Secondary | ICD-10-CM | POA: Diagnosis present

## 2020-11-07 DIAGNOSIS — K219 Gastro-esophageal reflux disease without esophagitis: Secondary | ICD-10-CM | POA: Diagnosis present

## 2020-11-07 DIAGNOSIS — D509 Iron deficiency anemia, unspecified: Secondary | ICD-10-CM | POA: Diagnosis present

## 2020-11-07 DIAGNOSIS — Z8249 Family history of ischemic heart disease and other diseases of the circulatory system: Secondary | ICD-10-CM

## 2020-11-07 DIAGNOSIS — G379 Demyelinating disease of central nervous system, unspecified: Secondary | ICD-10-CM | POA: Diagnosis present

## 2020-11-07 DIAGNOSIS — I739 Peripheral vascular disease, unspecified: Secondary | ICD-10-CM | POA: Diagnosis present

## 2020-11-07 DIAGNOSIS — I10 Essential (primary) hypertension: Secondary | ICD-10-CM | POA: Diagnosis present

## 2020-11-07 DIAGNOSIS — E871 Hypo-osmolality and hyponatremia: Secondary | ICD-10-CM | POA: Diagnosis present

## 2020-11-07 DIAGNOSIS — R200 Anesthesia of skin: Secondary | ICD-10-CM

## 2020-11-07 LAB — BASIC METABOLIC PANEL
Anion gap: 13 (ref 5–15)
BUN: 17 mg/dL (ref 8–23)
CO2: 20 mmol/L — ABNORMAL LOW (ref 22–32)
Calcium: 9.5 mg/dL (ref 8.9–10.3)
Chloride: 100 mmol/L (ref 98–111)
Creatinine, Ser: 0.62 mg/dL (ref 0.44–1.00)
GFR, Estimated: >60 mL/min (ref >60)
Glucose, Bld: 88 mg/dL (ref 70–99)
Potassium: 4 mmol/L (ref 3.5–5.1)
Sodium: 133 mmol/L — ABNORMAL LOW (ref 135–145)

## 2020-11-07 LAB — CBC
HCT: 27.6 % — ABNORMAL LOW (ref 36.0–46.0)
Hemoglobin: 8.6 g/dL — ABNORMAL LOW (ref 12.0–15.0)
MCH: 29.8 pg (ref 26.0–34.0)
MCHC: 31.2 g/dL (ref 30.0–36.0)
MCV: 95.5 fL (ref 80.0–100.0)
Platelets: 266 10*3/uL (ref 150–400)
RBC: 2.89 MIL/uL — ABNORMAL LOW (ref 3.87–5.11)
RDW: 19.2 % — ABNORMAL HIGH (ref 11.5–15.5)
WBC: 5.8 10*3/uL (ref 4.0–10.5)
nRBC: 0 % (ref 0.0–0.2)

## 2020-11-07 LAB — URINALYSIS, COMPLETE (UACMP) WITH MICROSCOPIC
Bilirubin Urine: NEGATIVE
Glucose, UA: NEGATIVE mg/dL
Hgb urine dipstick: NEGATIVE
Ketones, ur: 20 mg/dL — AB
Leukocytes,Ua: NEGATIVE
Nitrite: POSITIVE — AB
Protein, ur: NEGATIVE mg/dL
Specific Gravity, Urine: 1.014 (ref 1.005–1.030)
pH: 5 (ref 5.0–8.0)

## 2020-11-07 LAB — T4, FREE: Free T4: 1.18 ng/dL — ABNORMAL HIGH (ref 0.61–1.12)

## 2020-11-07 LAB — TROPONIN I (HIGH SENSITIVITY): Troponin I (High Sensitivity): 10 ng/L (ref <18)

## 2020-11-07 LAB — HEPATIC FUNCTION PANEL
ALT: 37 U/L (ref 0–44)
AST: 30 U/L (ref 15–41)
Albumin: 3.7 g/dL (ref 3.5–5.0)
Alkaline Phosphatase: 78 U/L (ref 38–126)
Bilirubin, Direct: <0.1 mg/dL (ref 0.0–0.2)
Total Bilirubin: 1.1 mg/dL (ref 0.3–1.2)
Total Protein: 6.3 g/dL — ABNORMAL LOW (ref 6.5–8.1)

## 2020-11-07 LAB — RESP PANEL BY RT-PCR (FLU A&B, COVID) ARPGX2
Influenza A by PCR: NEGATIVE
Influenza B by PCR: NEGATIVE
SARS Coronavirus 2 by RT PCR: NEGATIVE

## 2020-11-07 LAB — LIPASE, BLOOD: Lipase: 37 U/L (ref 11–51)

## 2020-11-07 LAB — TSH: TSH: 2.26 u[IU]/mL (ref 0.350–4.500)

## 2020-11-07 IMAGING — CT CT CERVICAL SPINE W/O CM
3 of 4 series · 9 of 33 positions shown, 10 images · non-contrast
Comparison: None.

CLINICAL DATA: Neck pain acute. weakness and loss of appetite for 4
days. Pt able to walk with assistance.

EXAM:
CT HEAD WITHOUT CONTRAST
CT CERVICAL SPINE WITHOUT CONTRAST
TECHNIQUE: Multidetector CT imaging of the head and cervical spine was
performed following the standard protocol without intravenous
contrast. Multiplanar CT image reconstructions of the cervical spine
were also generated.

[Series 6: sagittal bone · sagittal · 0.20mm/px · 5 of 36 slices shown]
[im 12/36  bone]
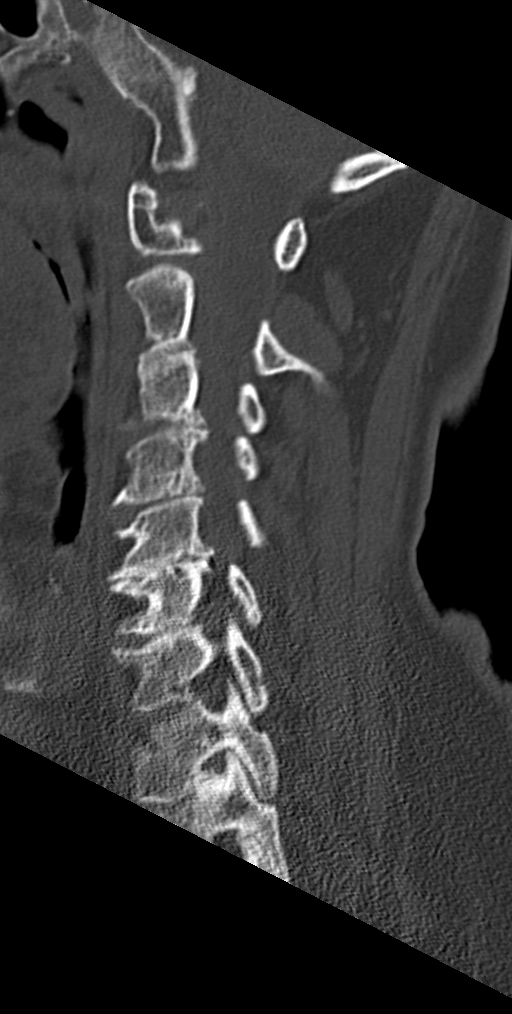
[im 15/36  bone]
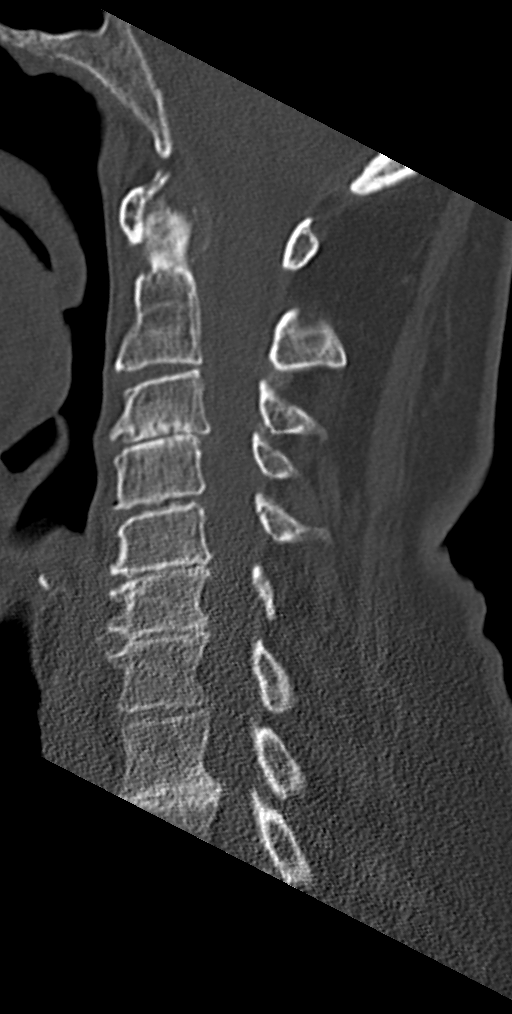
[im 18/36  bone]
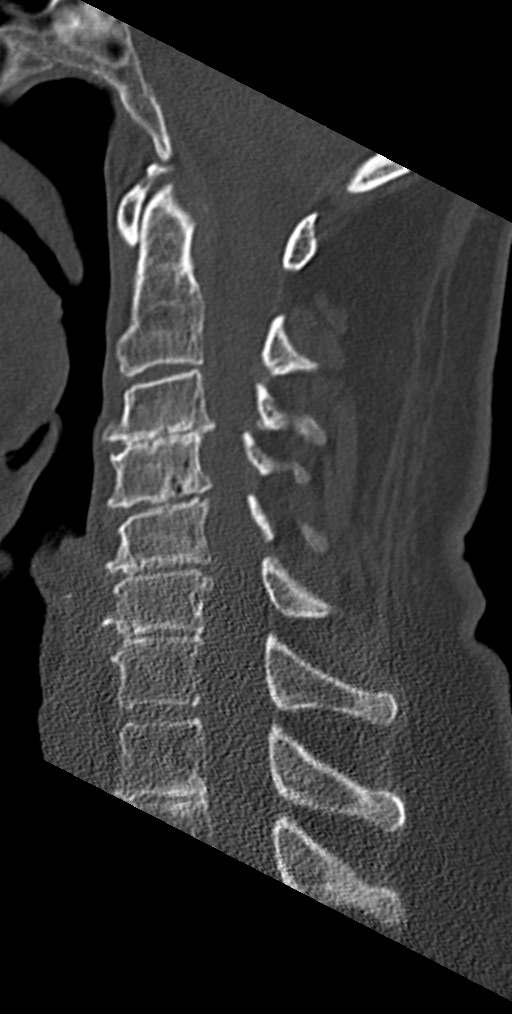
[im 21/36  bone]
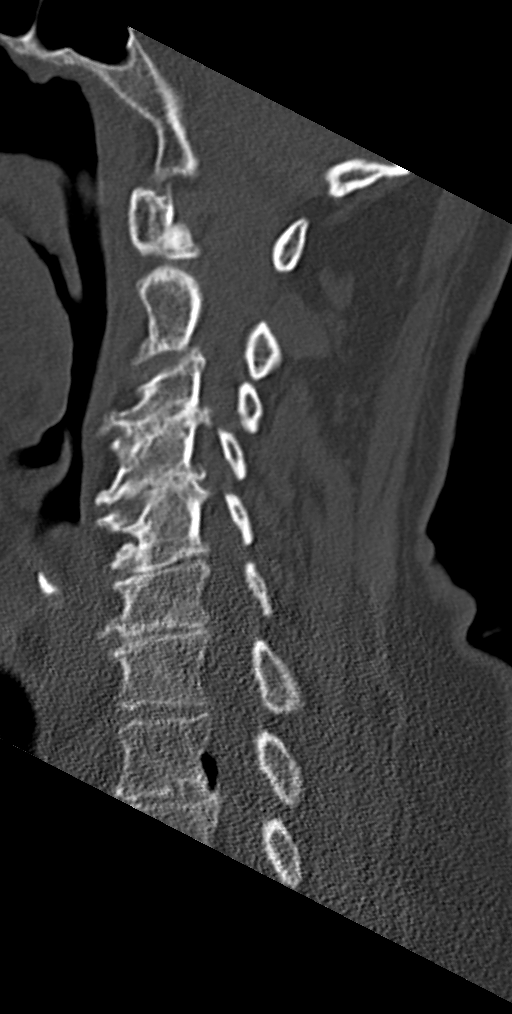
[im 24/36  bone]
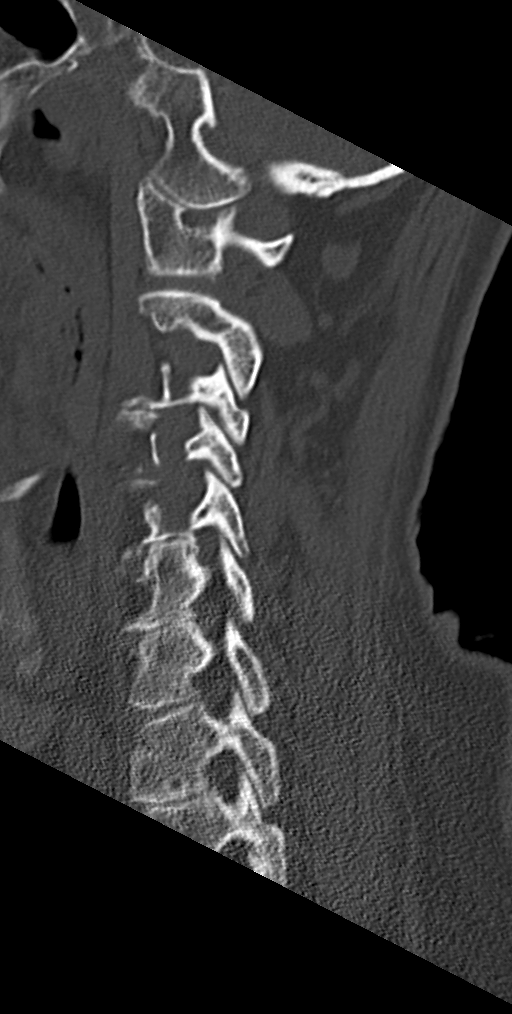

[Series 7: coronal bone · coronal · 0.16mm/px · 3 of 36 slices shown]
[im 8/36  bone]
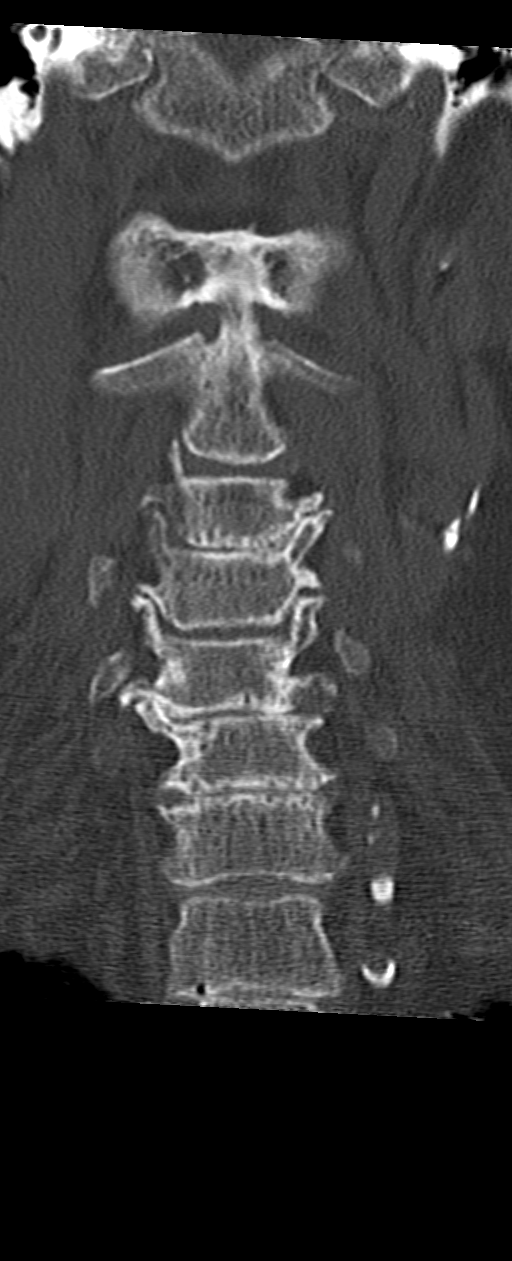
[im 15/36  bone]
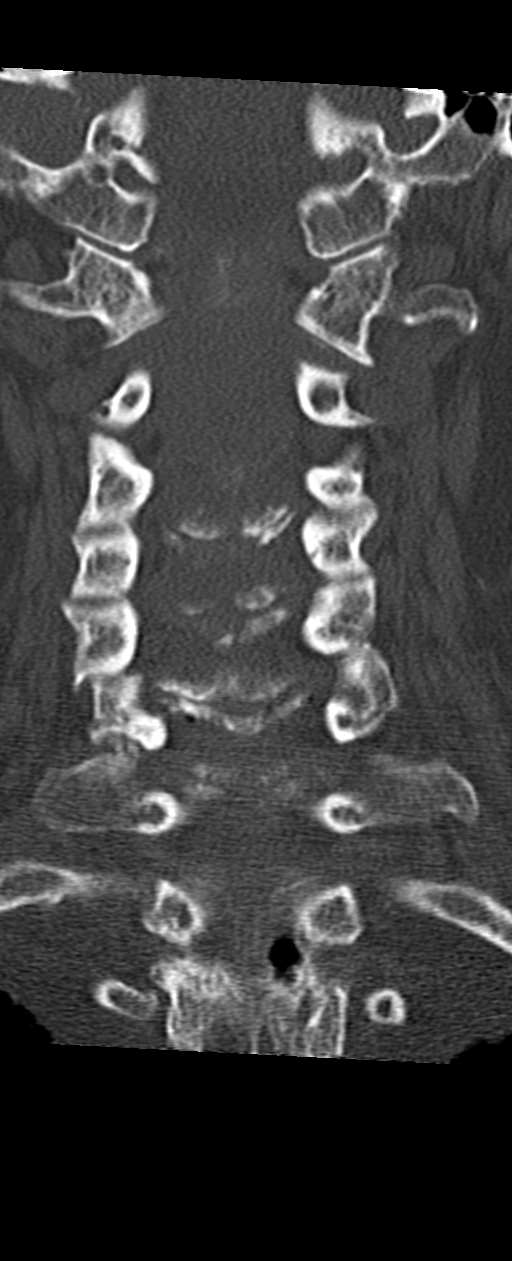
[im 22/36  bone]
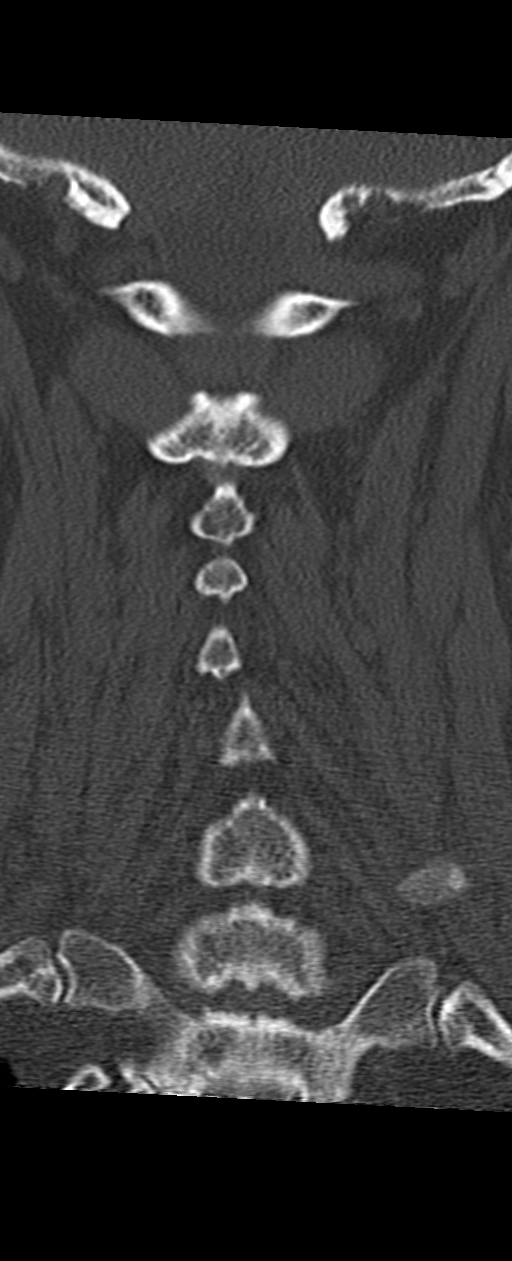

[Series 8: orthogonal bone · axial · 0.18mm/px · z∈[+311,+311]mm · 1 of 82 slices shown, 2 images]
[im 47/82  soft-tissue]
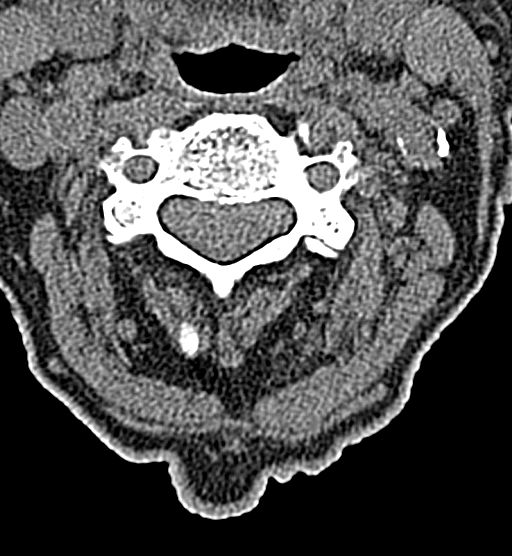
[im 47/82  bone]
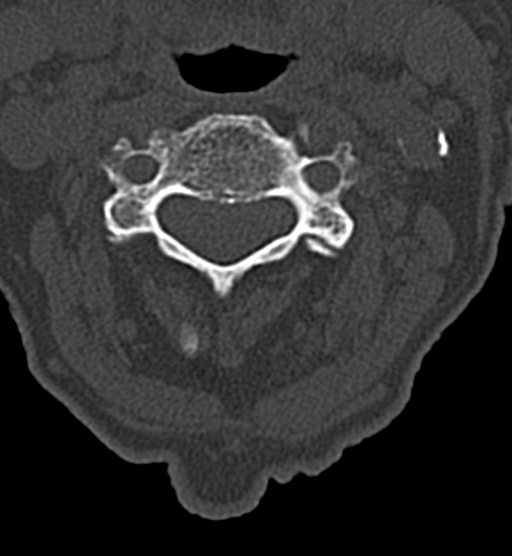

[9 of 33 positions shown; findings below may reference images not displayed]

FINDINGS: CT HEAD FINDINGS

Brain:

No evidence of large-territorial acute infarction. No parenchymal
hemorrhage. No mass lesion. No extra-axial collection.

No mass effect or midline shift. No hydrocephalus. Basilar cisterns
are patent.

Vascular: No hyperdense vessel. Atherosclerotic calcifications are
present within the cavernous internal carotid arteries.

Skull: No acute fracture or focal lesion.

Sinuses/Orbits: Paranasal sinuses and mastoid air cells are clear.
The orbits are unremarkable.

Other: None.

CT CERVICAL SPINE FINDINGS

Alignment: Normal.

Skull base and vertebrae: Multilevel degenerative changes of the
spine with multilevel severe osseous neural foraminal stenosis. No
severe osseous central canal stenosis. No acute fracture. No
aggressive appearing focal osseous lesion or focal pathologic
process.

Soft tissues and spinal canal: No prevertebral fluid or swelling. No
visible canal hematoma.

Upper chest: Unremarkable.

Other: Atherosclerotic plaque carotid arteries within the neck.
IMPRESSION: 1. No acute intracranial abnormality.
2. No acute displaced fracture or traumatic listhesis of the
cervical spine.
3. Multilevel degenerative changes of the spine with multilevel
severe osseous neural foraminal stenosis.

## 2020-11-07 IMAGING — CT CT HEAD W/O CM
4 series · 15 of 47 positions shown, 17 images · non-contrast
Comparison: None.

CLINICAL DATA: Neck pain acute. weakness and loss of appetite for 4
days. Pt able to walk with assistance.

EXAM:
CT HEAD WITHOUT CONTRAST
CT CERVICAL SPINE WITHOUT CONTRAST
TECHNIQUE: Multidetector CT imaging of the head and cervical spine was
performed following the standard protocol without intravenous
contrast. Multiplanar CT image reconstructions of the cervical spine
were also generated.

[Series 2: head bone · axial · 0.40mm/px · z∈[+384,+398]mm · 2 of 72 slices shown]
[im 8/72  bone]
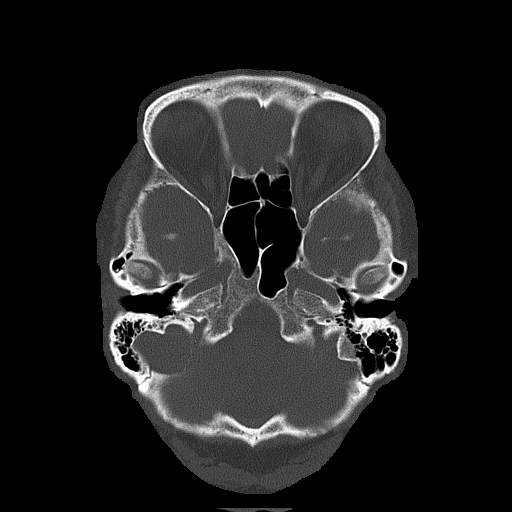
[im 15/72  bone]
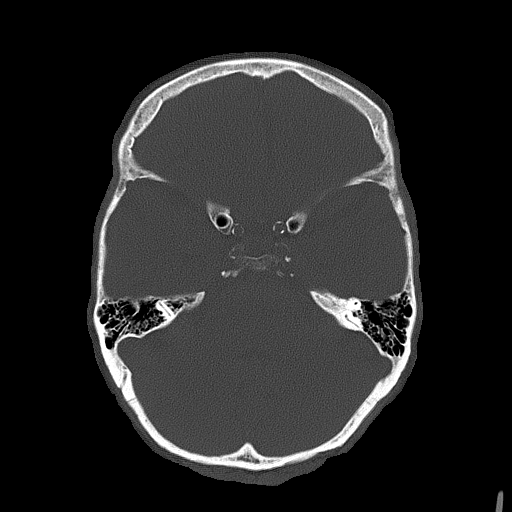

[Series 3: ax head wo · axial · 0.32mm/px · z∈[+375,+480]mm · 7 of 29 slices shown, 9 images]
[im 4/29  brain]
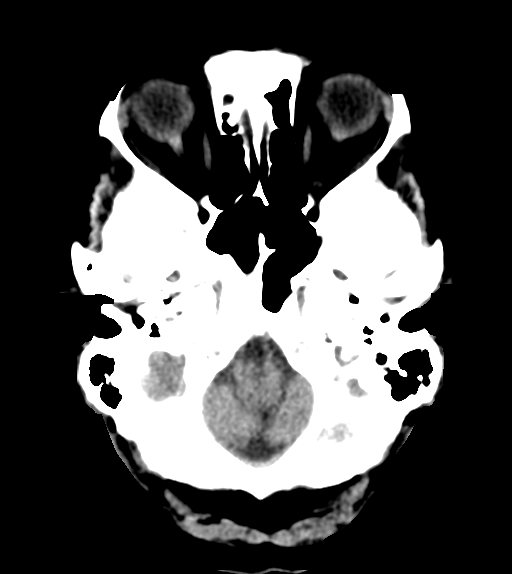
[im 4/29  bone]
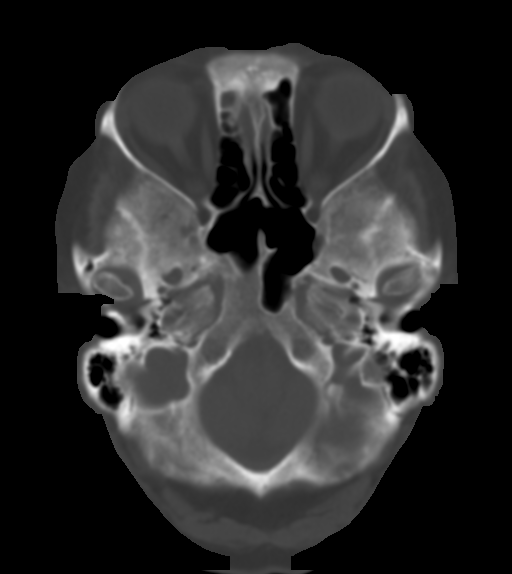
[im 8/29  brain]
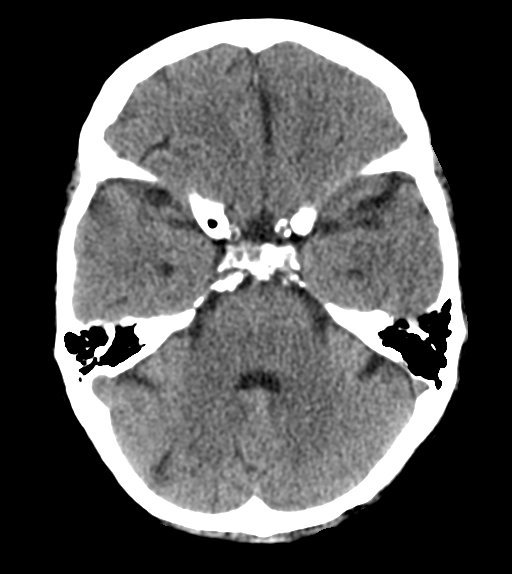
[im 11/29  brain]
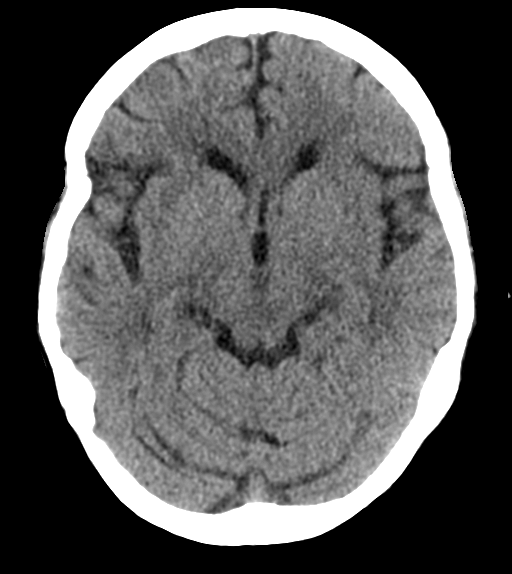
[im 15/29  brain]
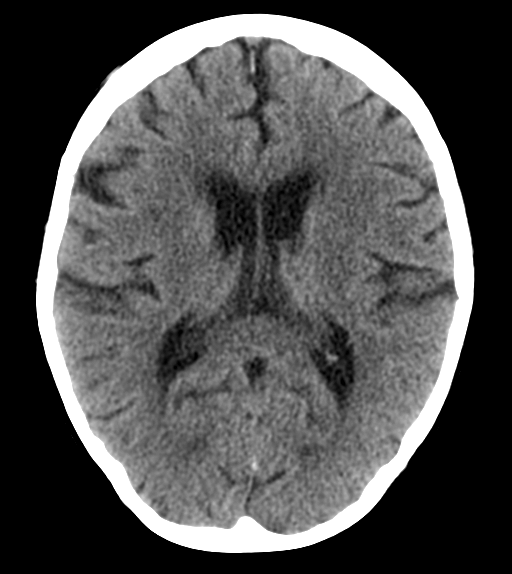
[im 18/29  brain]
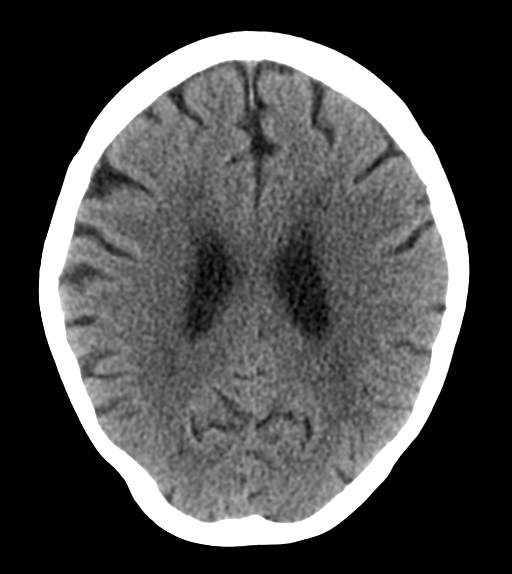
[im 18/29  bone]
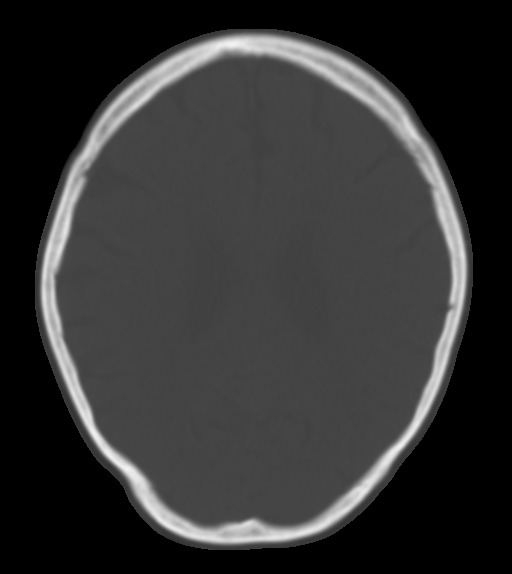
[im 22/29  brain]
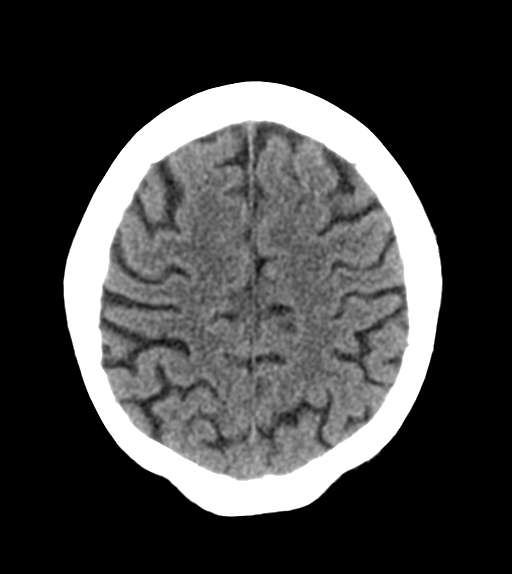
[im 25/29  brain]
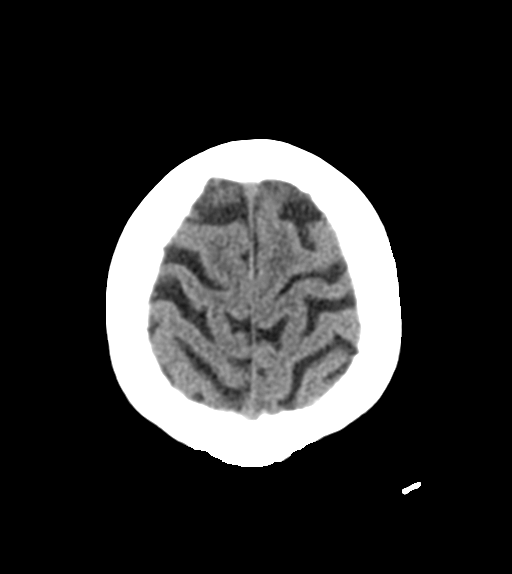

[Series 4: coronal soft tissue · coronal · 0.30mm/px · 3 of 58 slices shown]
[im 20/58  brain]
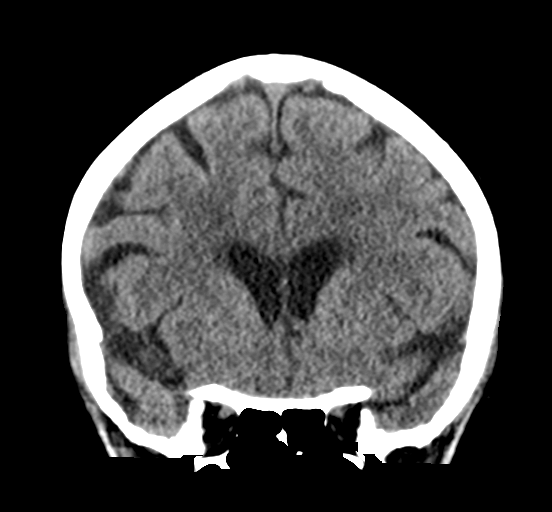
[im 26/58  brain]
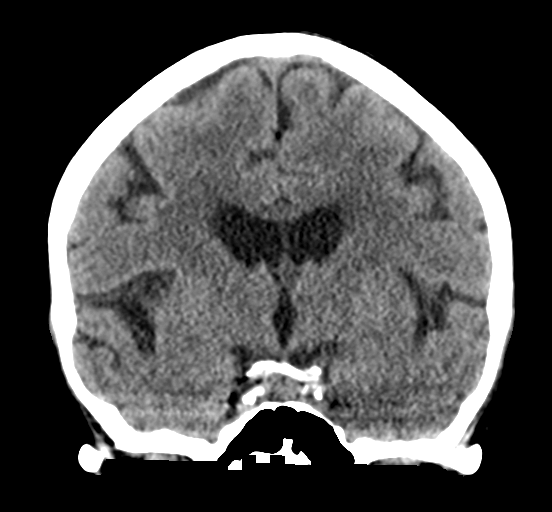
[im 32/58  brain]
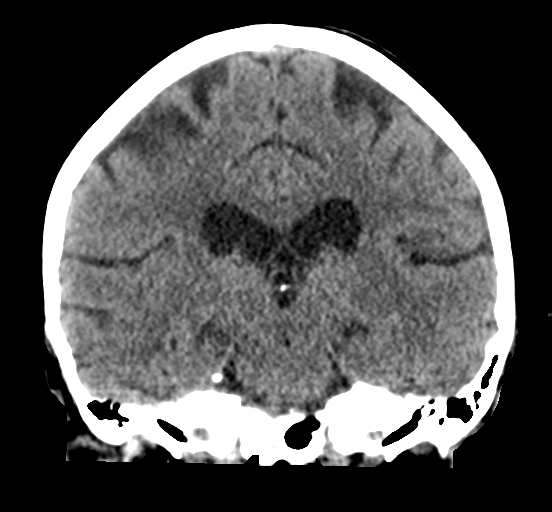

[Series 5: sagittal soft tissue · sagittal · 0.29mm/px · 3 of 50 slices shown]
[im 17/50  brain]
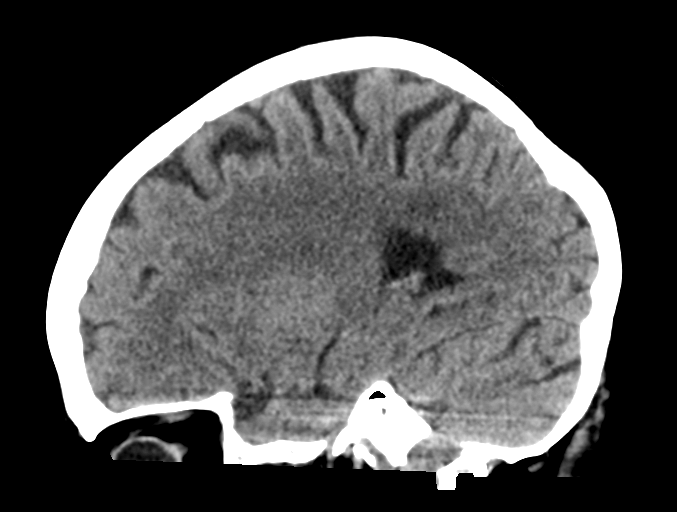
[im 25/50  brain]
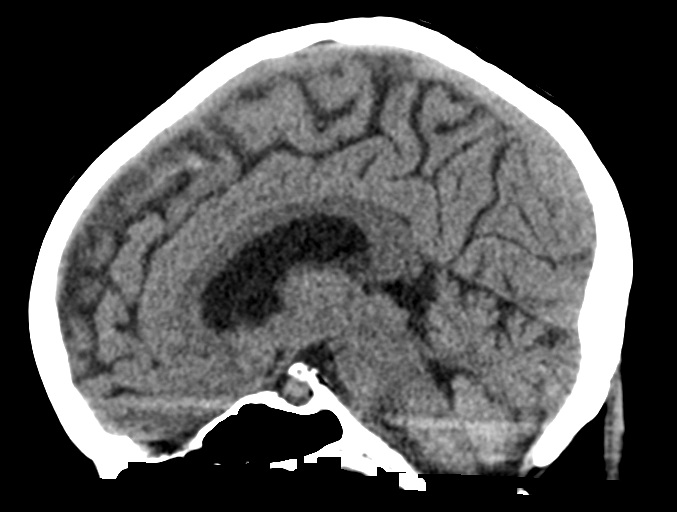
[im 33/50  brain]
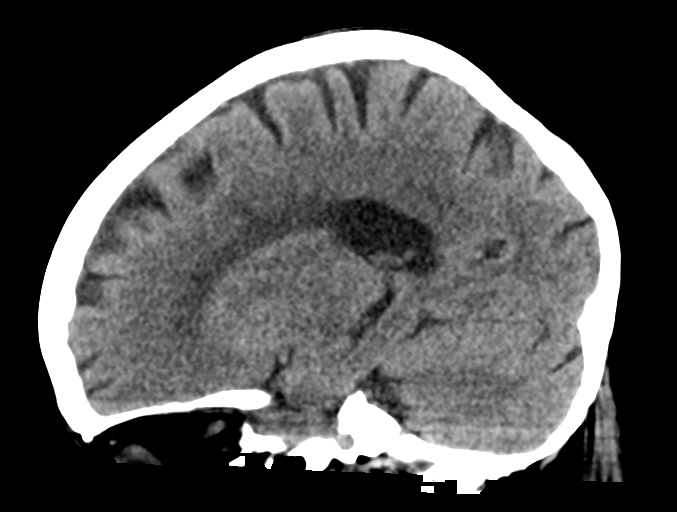

[15 of 47 positions shown; findings below may reference images not displayed]

FINDINGS: CT HEAD FINDINGS

Brain:

No evidence of large-territorial acute infarction. No parenchymal
hemorrhage. No mass lesion. No extra-axial collection.

No mass effect or midline shift. No hydrocephalus. Basilar cisterns
are patent.

Vascular: No hyperdense vessel. Atherosclerotic calcifications are
present within the cavernous internal carotid arteries.

Skull: No acute fracture or focal lesion.

Sinuses/Orbits: Paranasal sinuses and mastoid air cells are clear.
The orbits are unremarkable.

Other: None.

CT CERVICAL SPINE FINDINGS

Alignment: Normal.

Skull base and vertebrae: Multilevel degenerative changes of the
spine with multilevel severe osseous neural foraminal stenosis. No
severe osseous central canal stenosis. No acute fracture. No
aggressive appearing focal osseous lesion or focal pathologic
process.

Soft tissues and spinal canal: No prevertebral fluid or swelling. No
visible canal hematoma.

Upper chest: Unremarkable.

Other: Atherosclerotic plaque carotid arteries within the neck.
IMPRESSION: 1. No acute intracranial abnormality.
2. No acute displaced fracture or traumatic listhesis of the
cervical spine.
3. Multilevel degenerative changes of the spine with multilevel
severe osseous neural foraminal stenosis.

## 2020-11-07 MED ORDER — SODIUM CHLORIDE 0.9 % IV BOLUS
500.0000 mL | Freq: Once | INTRAVENOUS | Status: AC
  Administered 2020-11-07: 500 mL via INTRAVENOUS

## 2020-11-07 MED ORDER — OXYCODONE HCL 5 MG PO TABS
10.0000 mg | ORAL_TABLET | Freq: Once | ORAL | Status: AC
  Administered 2020-11-07: 10 mg via ORAL
  Filled 2020-11-07: qty 2

## 2020-11-07 MED ORDER — ENOXAPARIN SODIUM 40 MG/0.4ML IJ SOSY
40.0000 mg | PREFILLED_SYRINGE | INTRAMUSCULAR | Status: DC
  Administered 2020-11-07 – 2020-11-17 (×11): 40 mg via SUBCUTANEOUS
  Filled 2020-11-07 (×11): qty 0.4

## 2020-11-07 MED ORDER — SODIUM CHLORIDE 0.9 % IV SOLN
INTRAVENOUS | Status: DC
  Administered 2020-11-11: 1000 mL via INTRAVENOUS

## 2020-11-07 NOTE — ED Notes (Signed)
Attempted to ambulate patient with walker. MD at bedside to assist. Patient unable to ambulate with steady gait.

## 2020-11-07 NOTE — ED Triage Notes (Signed)
Pt in via EMS from home with c/o weakness and loss of appetite for 4 days. Pt able to walk with assistance. Pt thinks she has neuropathy but has not been diagnosed with it. Pt has not eaten much but has drank a lot of water. FSBS 88. BP 129/54, HR 89, 96% RA

## 2020-11-07 NOTE — Telephone Encounter (Signed)
I have also sent mychart message since I cannot reach patient.

## 2020-11-07 NOTE — ED Provider Notes (Addendum)
[facility] Emergency Department Provider Note  ____________________________________________   Event Date/Time   First MD Initiated Contact with Patient 11/07/20 [phone]     (approximate)  I have reviewed the triage vital signs and the nursing notes.   HISTORY  Chief Complaint Weakness    HPI [name] is a 75 y.o. female with coronary disease, DVT in the setting of childbirth, hypertension, hyperlipidemia who comes in with concerns for weakness and loss of appetite.  Patient reports that she ambulates with a walker.  She reports that recently she has been feeling more weak and she had a near fall so she has been using a wheelchair at home because she is afraid that she might fall again.  She states that she just does not feel hungry does not want to eat as much.  States that she just does not have the drive to eat.  States that she has been drinking water.  Denies any chest pain, shortness of breath, abdominal pain, leg weakness.  Has some tingling in her hands and feet that is been going on for months that is unchanged.  She reports that she does have some depression and was restarted on sertraline recently.  Denies any SI.  States that she just lives in her house all by herself and it makes her sad sometimes.  Denies any recent COVID infection.  Denies any urinary symptoms.  Patient denies hitting her head.          Past Medical History:  Diagnosis Date   Abnormal Pap smear of cervix    Dr. Prentice Docker    Coronary artery disease    a. 06/2009 MI/PCI to LCX and LAD; b. 01/2011 Occluded stents-->CABG x 3 ([facility]): LIMA->LAD, VG->LCX, VG->RPDA; c. 01/2012 Cath: LM 50, LAD 95ost, 173m, LCX 95ost, 114m, RCA 12m, RPDA small, LIMA->LAD ok, VG->LCX ok, VG->RPDA 100-->Med rx.   Degenerative disc disease, lumbar    L4/5 noted CT ab/pelvis 05/19/11    DVT (deep venous thrombosis) ([place])    after childbirth age 50   GERD (gastroesophageal reflux disease)     Hyperlipidemia    Hypertension    under control   Macular degeneration    Myocardial infarction [facility]) 2011   Parathyroid adenoma    right 13 x 8 mm s/p resection    PVD (peripheral vascular disease) ([place])    a. 01/2010 s/p Aortofemoral bypass.    Patient Active Problem List   Diagnosis Date Noted   Leg swelling 06/19/2020   Closed fracture of right distal humerus 12/19/2019   Preoperative clearance 10/27/2019   Elevated liver enzymes 10/27/2019   Choledocholithiasis    Fatigue 05/17/2019   Closed Colles' fracture of right radius 12/30/2018   Former smoker 12/07/2018   CAD (coronary artery disease), native coronary artery 01/23/2018   Cholecystitis    Abnormal Pap smear of cervix 04/22/2017   Cervical high risk HPV (human papillomavirus) test positive 04/22/2017   DDD (degenerative disc disease), lumbar    Closed fracture of ankle, trimalleolar 10/08/2016   Abnormal Pap smear of vagina 05/05/2016   Nausea without vomiting 03/26/2016   Anxiety 01/21/2016   Abdominal distension 01/21/2016   Obesity 12/19/2015   Hypercalcemia 10/03/2015   Need for hepatitis C screening test 10/03/2015   Encounter for routine adult medical exam with abnormal findings 01/17/2015   Lipoma of arm 12/25/2014   Macular degeneration 12/25/2014   Medicare annual wellness visit, subsequent 01/04/2014   Osteoporosis 03/07/2013  Chronic back pain 03/07/2013   Weight gain 09/20/2012   Essential hypertension, benign 05/31/2012   Coronary artery disease 05/31/2012   GERD (gastroesophageal reflux disease) 05/31/2012   Allergic rhinitis 05/31/2012   Primary hyperparathyroidism ([place]) 05/31/2012   General medical exam 11/19/2011   Neuropathy 11/19/2011   PVD (peripheral vascular disease) ([place]) 07/30/2010   Hyperlipidemia 03/26/2010   CAD, AUTOLOGOUS BYPASS GRAFT 03/26/2010   MULTI-VESSEL OR BILATERAL STENOSIS W/O INFARCTION 03/26/2010   TOBACCO USE, QUIT 03/26/2010    Past Surgical History:   Procedure Laterality Date   CARDIAC CATHETERIZATION  2013   [facility]   CATARACT EXTRACTION     COLONOSCOPY     COLPOSCOPY     CORONARY ARTERY BYPASS GRAFT  02/07/2010   x 3   ERCP N/A 10/05/2019   Procedure: ENDOSCOPIC RETROGRADE CHOLANGIOPANCREATOGRAPHY (ERCP);  Surgeon: Lucilla Lame, MD;  Location: [facility] ENDOSCOPY;  Service: Endoscopy;  Laterality: N/A;   FACIAL COSMETIC SURGERY  2013   Dr. Nadeen Landau   FOOT SURGERY     right   ILIO-FEMORAL BYPASS GRAFT  2011   LIVER BIOPSY N/A 10/31/2019   Procedure: LIVER BIOPSY;  Surgeon: Olean Ree, MD;  Location: [facility] ORS;  Service: General;  Laterality: N/A;   MITRAL VALVE REPAIR  01/2010   ORIF ANKLE FRACTURE Right 10/08/2016   Procedure: OPEN REDUCTION INTERNAL FIXATION (ORIF) ANKLE FRACTURE;  Surgeon: Corky Mull, MD;  Location: [facility] ORS;  Service: Orthopedics;  Laterality: Right;   ORIF HUMERUS FRACTURE Right 12/20/2019   Procedure: OPEN REDUCTION INTERNAL FIXATION (ORIF) DISTAL HUMERUS FRACTURE;  Surgeon: Shona Needles, MD;  Location: [facility];  Service: Orthopedics;  Laterality: Right;   ORIF WRIST FRACTURE Right 12/29/2018   Procedure: OPEN REDUCTION INTERNAL FIXATION (ORIF) WRIST FRACTURE;  Surgeon: Corky Mull, MD;  Location: [facility] ORS;  Service: Orthopedics;  Laterality: Right;   PARATHYROIDECTOMY     TUBAL LIGATION      Prior to Admission medications   Medication Sig Start Date End Date Taking? Authorizing Provider  aspirin 81 MG EC tablet Take 81 mg by mouth daily.      [provider]  BESIVANCE 0.6 % SUSP Place 4 drops into the left eye See admin instructions. Instill 4 drop into the left eye on the day of and the day after eye injection 06/22/19   [provider]  Cholecalciferol (VITAMIN D3) 1000 UNITS CAPS Take 1,000 Units by mouth daily.    [provider]  cyclobenzaprine (FLEXERIL) 5 MG tablet Take 1 tablet (5 mg total) by mouth 3 (three) times daily as needed for muscle spasms. 11/01/19   Olean Ree, MD  doxylamine, Sleep, (SLEEP AID) 25 MG tablet Take 25 mg by mouth at bedtime as needed.     [provider]  ezetimibe (ZETIA) 10 MG tablet Take 1 tablet (10 mg total) by mouth daily. 11/01/20   Minna Merritts, MD  furosemide (LASIX) 20 MG tablet Take 1 tablet (20 mg total) by mouth daily as needed. 06/25/20   Burnard Hawthorne, FNP  gabapentin (NEURONTIN) 100 MG capsule Take 1 capsule (100 mg total) by mouth 3 (three) times daily. 09/30/20   Burnard Hawthorne, FNP  ketorolac (ACULAR) 0.5 % ophthalmic solution SMARTSIG:In Eye(s) 08/20/20   [provider]  metoprolol tartrate (LOPRESSOR) 25 MG tablet Take 0.5 tablets (12.5 mg total) by mouth 2 (two) times daily. 11/01/20   Minna Merritts, MD  Multiple Vitamins-Minerals (PRESERVISION AREDS 2 PO) Take 1  capsule by mouth 2 (two) times daily.    [provider]  Oxycodone HCl 10 MG TABS Take 10 mg by mouth 3 (three) times daily as needed. 07/24/20   [provider]  pantoprazole (PROTONIX) 40 MG tablet Take 1 tablet (40 mg total) by mouth daily. 01/15/20   Burnard Hawthorne, FNP  Probiotic Product (RISA-BID PROBIOTIC PO) Take 250 mg by mouth daily.     [provider]  rosuvastatin (CRESTOR) 10 MG tablet Take 1 tablet (10 mg total) by mouth daily. 09/27/20   Burnard Hawthorne, FNP  sertraline (ZOLOFT) 50 MG tablet Take 50 mg by mouth daily.    [provider]  trimethoprim-polymyxin b (POLYTRIM) ophthalmic solution SMARTSIG:In Eye(s) 08/16/20   [provider]  vitamin B-12 (CYANOCOBALAMIN) 1000 MCG tablet Take 1,000 mcg by mouth daily.    [provider]    Allergies Patient has no known allergies.  Family History  Problem Relation Age of Onset   Heart disease Father     Social History Social History   Tobacco Use   Smoking status: Former    Packs/day: 1.00    Years: 44.00    Pack years: 44.00    Types: Cigarettes    Quit date: 07/15/2009    Years since  quitting: 11.3   Smokeless tobacco: Never   Tobacco comments:    quit 06/2009 smoked from 40s to 2011 1.5 ppd no FH lung cancer   Vaping Use   Vaping Use: Never used  Substance Use Topics   Alcohol use: Yes    Alcohol/week: 1.0 standard drink    Types: 1 Standard drinks or equivalent per week    Comment: occ.   Drug use: No      Review of Systems Constitutional: No fever/chills, weakness Eyes: No visual changes. ENT: No sore throat. Cardiovascular: Denies chest pain. Respiratory: Denies shortness of breath. Gastrointestinal: No abdominal pain.  No nausea, no vomiting.  No diarrhea.  No constipation.  Decreased appetite Genitourinary: Negative for dysuria. Musculoskeletal: Negative for back pain. Skin: Negative for rash. Neurological: Negative for headaches, focal weakness or numbness. All other ROS negative ____________________________________________   PHYSICAL EXAM:  VITAL SIGNS: ED Triage Vitals [11/07/20 1518]  Enc Vitals Group     BP 116/60     Pulse Rate 89     Resp 16     Temp 98.5 F (36.9 C)     Temp Source Oral     SpO2 95 %     Weight 120 lb (54.4 kg)     Height 5' (1.524 m)     Head Circumference      Peak Flow      Pain Score 0     Pain Loc      Pain Edu?      Excl. in [place]?     Constitutional: Alert and oriented. Well appearing and in no acute distress. Eyes: Conjunctivae are normal. EOMI. Head: Atraumatic. Nose: No congestion/rhinnorhea. Mouth/Throat: Mucous membranes are moist.   Neck: No stridor. Trachea Midline. FROM Cardiovascular: Normal rate, regular rhythm. Grossly normal heart sounds.  Good peripheral circulation. Respiratory: Normal respiratory effort.  No retractions. Lungs CTAB. Gastrointestinal: Soft and nontender. No distention. No abdominal bruits.  Musculoskeletal: No lower extremity tenderness nor edema.  No joint effusions. Neurologic:  Normal speech and language. No gross focal neurologic deficits are appreciated.  Cranial 2  through 12 are intact.  Equal strength in arms and legs.  Sensation changes on  both legs.  On the left leg she states that she cannot feel it at all.  On the right leg it is just the top part of her foot. Skin:  Skin is warm, dry and intact. No rash noted. Psychiatric: Mood and affect are normal. Speech and behavior are normal. GU: Brown stool, Hemoccult negative  ____________________________________________   LABS (all labs ordered are listed, but only abnormal results are displayed)  Labs Reviewed  BASIC METABOLIC PANEL - Abnormal; Notable for the following components:      Result Value   Sodium 133 (*)    CO2 20 (*)    All other components within normal limits  CBC - Abnormal; Notable for the following components:   RBC 2.89 (*)    Hemoglobin 8.6 (*)    HCT 27.6 (*)    RDW 19.2 (*)    All other components within normal limits  RESP PANEL BY RT-PCR (FLU A&B, COVID) ARPGX2  URINALYSIS, COMPLETE (UACMP) WITH MICROSCOPIC  HEPATIC FUNCTION PANEL  LIPASE, BLOOD  TSH  T4, FREE  TROPONIN I (HIGH SENSITIVITY)   ____________________________________________   ED ECG REPORT I, Vanessa Nickerson, the attending physician, personally viewed and interpreted this ECG.  Normal sinus rate of 86, no ST elevation, no T wave inversions, normal intervals ____________________________________________  RADIOLOGY   Official radiology report(s): CT Head Wo Contrast  Result Date: 11/07/2020 CLINICAL DATA:  Neck pain acute. weakness and loss of appetite for 4 days. Pt able to walk with assistance. EXAM: CT HEAD WITHOUT CONTRAST CT CERVICAL SPINE WITHOUT CONTRAST TECHNIQUE: Multidetector CT imaging of the head and cervical spine was performed following the standard protocol without intravenous contrast. Multiplanar CT image reconstructions of the cervical spine were also generated. COMPARISON:  None. FINDINGS: CT HEAD FINDINGS Brain: No evidence of large-territorial acute infarction. No parenchymal  hemorrhage. No mass lesion. No extra-axial collection. No mass effect or midline shift. No hydrocephalus. Basilar cisterns are patent. Vascular: No hyperdense vessel. Atherosclerotic calcifications are present within the cavernous internal carotid arteries. Skull: No acute fracture or focal lesion. Sinuses/Orbits: Paranasal sinuses and mastoid air cells are clear. The orbits are unremarkable. Other: None. CT CERVICAL SPINE FINDINGS Alignment: Normal. Skull base and vertebrae: Multilevel degenerative changes of the spine with multilevel severe osseous neural foraminal stenosis. No severe osseous central canal stenosis. No acute fracture. No aggressive appearing focal osseous lesion or focal pathologic process. Soft tissues and spinal canal: No prevertebral fluid or swelling. No visible canal hematoma. Upper chest: Unremarkable. Other: Atherosclerotic plaque carotid arteries within the neck. IMPRESSION: 1. No acute intracranial abnormality. 2. No acute displaced fracture or traumatic listhesis of the cervical spine. 3. Multilevel degenerative changes of the spine with multilevel severe osseous neural foraminal stenosis. Electronically Signed   By: Iven Finn M.D.   On: 11/07/2020 20:38   CT Cervical Spine Wo Contrast  Result Date: 11/07/2020 CLINICAL DATA:  Neck pain acute. weakness and loss of appetite for 4 days. Pt able to walk with assistance. EXAM: CT HEAD WITHOUT CONTRAST CT CERVICAL SPINE WITHOUT CONTRAST TECHNIQUE: Multidetector CT imaging of the head and cervical spine was performed following the standard protocol without intravenous contrast. Multiplanar CT image reconstructions of the cervical spine were also generated. COMPARISON:  None. FINDINGS: CT HEAD FINDINGS Brain: No evidence of large-territorial acute infarction. No parenchymal hemorrhage. No mass lesion. No extra-axial collection. No mass effect or midline shift. No hydrocephalus. Basilar cisterns are patent. Vascular: No hyperdense  vessel. Atherosclerotic calcifications are present within  the cavernous internal carotid arteries. Skull: No acute fracture or focal lesion. Sinuses/Orbits: Paranasal sinuses and mastoid air cells are clear. The orbits are unremarkable. Other: None. CT CERVICAL SPINE FINDINGS Alignment: Normal. Skull base and vertebrae: Multilevel degenerative changes of the spine with multilevel severe osseous neural foraminal stenosis. No severe osseous central canal stenosis. No acute fracture. No aggressive appearing focal osseous lesion or focal pathologic process. Soft tissues and spinal canal: No prevertebral fluid or swelling. No visible canal hematoma. Upper chest: Unremarkable. Other: Atherosclerotic plaque carotid arteries within the neck. IMPRESSION: 1. No acute intracranial abnormality. 2. No acute displaced fracture or traumatic listhesis of the cervical spine. 3. Multilevel degenerative changes of the spine with multilevel severe osseous neural foraminal stenosis. Electronically Signed   By: Iven Finn M.D.   On: 11/07/2020 20:38    ____________________________________________   PROCEDURES  Procedure(s) performed (including Critical Care):  Procedures   ____________________________________________   INITIAL IMPRESSION / ASSESSMENT AND PLAN / ED COURSE  [name] was evaluated in Emergency Department on 11/07/2020 for the symptoms described in the history of present illness. She was evaluated in the context of the global COVID-19 pandemic, which necessitated consideration that the patient might be at risk for infection with the SARS-CoV-2 virus that causes COVID-19. Institutional protocols and algorithms that pertain to the evaluation of patients at risk for COVID-19 are in a state of rapid change based on information released by regulatory bodies including the CDC and federal and state organizations. These policies and algorithms were followed during the patient's care in the ED.    Patient  is a well-appearing 75 year old who comes in with saying that she just has not been feeling hungry for the past few days.  This is caused her to feel little bit of weakness secondary to not eating food.  Labs ordered to evaluate for Electra abnormalities, AKI, UTI.  Patient is neuro intact and denies hitting her head.  Reports where she kind of slumped down off of her walker but is adamant that she did not hit her head and denies any headaches.  She denies any shortness of breath or coughing to suggest pulmonary issues.  Denies any abdominal pain.   Patient's hemoglobin was lower than previous.  Denies any bleeding history.  I did a rectal exam and it was brown stool and Hemoccult negative.  Patient's reports being on B12.  Discussed patient that she should have this followed up with her primary care doctor in a week he is if is continue to trend down this could be the cause of her weakness and she may need to have a blood transfusion and further work-up of this.  Patient expressed understanding but at this time she is not hypotensive and her heart rates are normal therefore do not feel like she needs an emergent transfusion today.  Work-up is reassuring however I did try to ambulate patient and she is not able to ambulate at all.  She has new sensation changes that are progressively getting worse and her legs seem ataxic upon evaluation.  Patient lives by herself.  I do not feel that this is safe dispo home.  She is supposed to get outpatient follow-up with neurology but had to cancel appointments because she cannot even get to them because she cannot ambulate.  We will set her up with CT head to make sure no intracranial hemorrhage and CT cervical to evaluate for some have cervical mass that could be causing her symptoms.  Patient  will need admission, neurology consult and MRIs  Discussed with the hospitalist team and we both think that getting emergent MRIs is important to make sure that she does not have  any brain mass, tumor, stroke, spinal cord issues that could be contributing to her rapidly progressive decline            ____________________________________________   FINAL CLINICAL IMPRESSION(S) / ED DIAGNOSES   Final diagnoses:  Weakness  Numbness and tingling  Cannot walk      MEDICATIONS GIVEN DURING THIS VISIT:  Medications  sodium chloride 0.9 % bolus 500 mL (has no administration in time range)     ED Discharge Orders     None        Note:  This document was prepared using Dragon voice recognition software and may include unintentional dictation errors.    Vanessa [place], MD 11/07/20 2053    Vanessa Farnham, MD 11/07/20 2113

## 2020-11-07 NOTE — ED Notes (Signed)
Assisted patient to bed pan 

## 2020-11-07 NOTE — ED Notes (Signed)
MD at bedside.  Patient aware of need for urine specimen collection. Urine cup provided.

## 2020-11-07 NOTE — H&P (Addendum)
History and Physical    [name] EHM:[id number] DOB: [birth year] DOA: 11/07/2020  PCP: Burnard Hawthorne, FNP  Chief Complaint: Unable to walk  HPI: [name] is a 75 y.o. female with a past medical history of coronary artery disease, history of DVT after childbirth, hypertension, hyperlipidemia, chronic low back pain, ambulatory dysfunction.  The patient presents to the emergency department due to numbness and tingling of bilateral lower extremities and upper extremities.  She has also been unable to ambulate at all for the past few days.  This is new for her apparently. Can usually ambulate using a walker.  She had an outpatient appointment with neurology planned in regards to this but had to cancel because she had no way of getting there.  She also complains of loss of appetite.  In the ED her hemoglobin was lower than her baseline.  Hemoccult was obtained and noted to have brown stool and heme-negative.  In the emergency department they tried to ambulate the patient but she has been unable to ambulate and noted to be ataxic as well.  Hospitalist team was paged for admission since she would not be a safe discharge.  Head showed no acute findings.  She denies any urinary complaints.  She denies any melena or hematochezia.    ED Course: CT imaging, lab work.  MRI of the lumbar spine and MRI of the brain pending.  Review of Systems: 14 point review of systems is negative except for what is mentioned above in the HPI.   Past Medical History:  Diagnosis Date   Abnormal Pap smear of cervix    Dr. Prentice Docker    Coronary artery disease    a. 06/2009 MI/PCI to LCX and LAD; b. 01/2011 Occluded stents-->CABG x 3 ([facility]): LIMA->LAD, VG->LCX, VG->RPDA; c. 01/2012 Cath: LM 50, LAD 95ost, 127m, LCX 95ost, 143m, RCA 66m, RPDA small, LIMA->LAD ok, VG->LCX ok, VG->RPDA 100-->Med rx.   Degenerative disc disease, lumbar    L4/5 noted CT ab/pelvis 05/19/11    DVT (deep venous thrombosis) ([place])     after childbirth age 89   GERD (gastroesophageal reflux disease)    Hyperlipidemia    Hypertension    under control   Macular degeneration    Myocardial infarction [facility]) 2011   Parathyroid adenoma    right 13 x 8 mm s/p resection    PVD (peripheral vascular disease) ([place])    a. 01/2010 s/p Aortofemoral bypass.    Past Surgical History:  Procedure Laterality Date   CARDIAC CATHETERIZATION  2013   [facility]   CATARACT EXTRACTION     COLONOSCOPY     COLPOSCOPY     CORONARY ARTERY BYPASS GRAFT  02/07/2010   x 3   ERCP N/A 10/05/2019   Procedure: ENDOSCOPIC RETROGRADE CHOLANGIOPANCREATOGRAPHY (ERCP);  Surgeon: Lucilla Lame, MD;  Location: [facility] ENDOSCOPY;  Service: Endoscopy;  Laterality: N/A;   FACIAL COSMETIC SURGERY  2013   Dr. Nadeen Landau   FOOT SURGERY     right   ILIO-FEMORAL BYPASS GRAFT  2011   LIVER BIOPSY N/A 10/31/2019   Procedure: LIVER BIOPSY;  Surgeon: Olean Ree, MD;  Location: [facility] ORS;  Service: General;  Laterality: N/A;   MITRAL VALVE REPAIR  01/2010   ORIF ANKLE FRACTURE Right 10/08/2016   Procedure: OPEN REDUCTION INTERNAL FIXATION (ORIF) ANKLE FRACTURE;  Surgeon: Corky Mull, MD;  Location: [facility] ORS;  Service: Orthopedics;  Laterality: Right;   ORIF HUMERUS FRACTURE Right 12/20/2019   Procedure: OPEN  REDUCTION INTERNAL FIXATION (ORIF) DISTAL HUMERUS FRACTURE;  Surgeon: Shona Needles, MD;  Location: [facility];  Service: Orthopedics;  Laterality: Right;   ORIF WRIST FRACTURE Right 12/29/2018   Procedure: OPEN REDUCTION INTERNAL FIXATION (ORIF) WRIST FRACTURE;  Surgeon: Corky Mull, MD;  Location: [facility] ORS;  Service: Orthopedics;  Laterality: Right;   PARATHYROIDECTOMY     TUBAL LIGATION      Social History   Socioeconomic History   Marital status: Single    Spouse name: Not on file   Number of children: Not on file   Years of education: Not on file   Highest education level: Not on file  Occupational History   Not on file  Tobacco Use   Smoking status:  Former    Packs/day: 1.00    Years: 44.00    Pack years: 44.00    Types: Cigarettes    Quit date: 07/15/2009    Years since quitting: 11.3   Smokeless tobacco: Never   Tobacco comments:    quit 06/2009 smoked from 8s to 2011 1.5 ppd no FH lung cancer   Vaping Use   Vaping Use: Never used  Substance and Sexual Activity   Alcohol use: Yes    Alcohol/week: 1.0 standard drink    Types: 1 Standard drinks or equivalent per week    Comment: occ.   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    Exercise- walks daily , wears fit bit.    Social Determinants of Health   Financial Resource Strain: Low Risk    Difficulty of Paying Living Expenses: Not hard at all  Food Insecurity: No Food Insecurity   Worried About Charity fundraiser in the Last Year: Never true   [place] in the Last Year: Never true  Transportation Needs: No Transportation Needs   Lack of Transportation (Medical): No   Lack of Transportation (Non-Medical): No  Physical Activity: Not on file  Stress: No Stress Concern Present   Feeling of Stress : Not at all  Social Connections: Not on file  Intimate Partner Violence: Not At Risk   [place] of Current or Ex-Partner: No   Emotionally Abused: No   Physically Abused: No   Sexually Abused: No    No Known Allergies  Family History  Problem Relation Age of Onset   Heart disease Father     Prior to Admission medications   Medication Sig Start Date End Date Taking? Authorizing Provider  aspirin 81 MG EC tablet Take 81 mg by mouth daily.      [provider]  BESIVANCE 0.6 % SUSP Place 4 drops into the left eye See admin instructions. Instill 4 drop into the left eye on the day of and the day after eye injection 06/22/19   [provider]   Cholecalciferol (VITAMIN D3) 1000 UNITS CAPS Take 1,000 Units by mouth daily.    [provider]  cyclobenzaprine (FLEXERIL) 5 MG tablet Take 1 tablet (5 mg total) by mouth 3 (three) times daily as needed for muscle spasms. 11/01/19   Olean Ree, MD  doxylamine, Sleep, (SLEEP AID) 25 MG tablet Take 25 mg by mouth at bedtime as needed.     [provider]  ezetimibe (  ZETIA) 10 MG tablet Take 1 tablet (10 mg total) by mouth daily. 11/01/20   Minna Merritts, MD  furosemide (LASIX) 20 MG tablet Take 1 tablet (20 mg total) by mouth daily as needed. 06/25/20   Burnard Hawthorne, FNP  gabapentin (NEURONTIN) 100 MG capsule Take 1 capsule (100 mg total) by mouth 3 (three) times daily. 09/30/20   Burnard Hawthorne, FNP  ketorolac (ACULAR) 0.5 % ophthalmic solution SMARTSIG:In Eye(s) 08/20/20   [provider]  metoprolol tartrate (LOPRESSOR) 25 MG tablet Take 0.5 tablets (12.5 mg total) by mouth 2 (two) times daily. 11/01/20   Minna Merritts, MD  Multiple Vitamins-Minerals (PRESERVISION AREDS 2 PO) Take 1 capsule by mouth 2 (two) times daily.    [provider]  Oxycodone HCl 10 MG TABS Take 10 mg by mouth 3 (three) times daily as needed. 07/24/20   [provider]  pantoprazole (PROTONIX) 40 MG tablet Take 1 tablet (40 mg total) by mouth daily. 01/15/20   Burnard Hawthorne, FNP  Probiotic Product (RISA-BID PROBIOTIC PO) Take 250 mg by mouth daily.     [provider]  rosuvastatin (CRESTOR) 10 MG tablet Take 1 tablet (10 mg total) by mouth daily. 09/27/20   Burnard Hawthorne, FNP  sertraline (ZOLOFT) 50 MG tablet Take 50 mg by mouth daily.    [provider]  trimethoprim-polymyxin b (POLYTRIM) ophthalmic solution SMARTSIG:In Eye(s) 08/16/20   [provider]  vitamin B-12 (CYANOCOBALAMIN) 1000 MCG tablet Take 1,000 mcg by mouth daily.    [provider]    Physical Exam: Vitals:   11/07/20 1600 11/07/20 1853 11/07/20 1932  11/07/20 2229  BP:  132/61 104/68 (!) 107/46  Pulse:  81 77 65  Resp:  16 16 16   Temp:  98.6 F (37 C)    TempSrc:  Oral    SpO2:  96% 97% 97%  Weight: 50 kg     Height: 5' (1.524 m)        General:  Appears calm and comfortable and is in NAD Cardiovascular:  RRR, no m/r/g.  Respiratory:   CTA bilaterally with no wheezes/rales/rhonchi.  Normal respiratory effort. Abdomen:  soft, NT, ND, NABS Skin:  no rash or induration seen on limited exam Musculoskeletal:  grossly normal tone BUE/BLE, good ROM, no bony abnormality Lower extremity:  No LE edema.  Limited foot exam with no ulcerations.  2+ distal pulses.Sensation changes on both legs.  On the left leg she states that she cannot feel it at all.  On the right leg it is just the top part of her foot. Psychiatric:  grossly normal mood and affect, speech fluent and appropriate, AOx3 Neurologic:  CN 2-12 grossly intact, moves all extremities in coordinated fashion, sensation intact    Radiological Exams on Admission: Independently reviewed - see discussion in A/P where applicable  CT Head Wo Contrast  Result Date: 11/07/2020 CLINICAL DATA:  Neck pain acute. weakness and loss of appetite for 4 days. Pt able to walk with assistance. EXAM: CT HEAD WITHOUT CONTRAST CT CERVICAL SPINE WITHOUT CONTRAST TECHNIQUE: Multidetector CT imaging of the head and cervical spine was performed following the standard protocol without intravenous contrast. Multiplanar CT image reconstructions of the cervical spine were also generated. COMPARISON:  None. FINDINGS: CT HEAD FINDINGS Brain: No evidence of large-territorial acute infarction. No parenchymal hemorrhage. No mass lesion. No extra-axial collection. No mass effect or midline shift. No hydrocephalus. Basilar cisterns are patent. Vascular: No hyperdense vessel. Atherosclerotic  calcifications are present within the cavernous internal carotid arteries. Skull: No acute fracture or focal lesion. Sinuses/Orbits:  Paranasal sinuses and mastoid air cells are clear. The orbits are unremarkable. Other: None. CT CERVICAL SPINE FINDINGS Alignment: Normal. Skull base and vertebrae: Multilevel degenerative changes of the spine with multilevel severe osseous neural foraminal stenosis. No severe osseous central canal stenosis. No acute fracture. No aggressive appearing focal osseous lesion or focal pathologic process. Soft tissues and spinal canal: No prevertebral fluid or swelling. No visible canal hematoma. Upper chest: Unremarkable. Other: Atherosclerotic plaque carotid arteries within the neck. IMPRESSION: 1. No acute intracranial abnormality. 2. No acute displaced fracture or traumatic listhesis of the cervical spine. 3. Multilevel degenerative changes of the spine with multilevel severe osseous neural foraminal stenosis. Electronically Signed   By: Iven Finn M.D.   On: 11/07/2020 20:38   CT Cervical Spine Wo Contrast  Result Date: 11/07/2020 CLINICAL DATA:  Neck pain acute. weakness and loss of appetite for 4 days. Pt able to walk with assistance. EXAM: CT HEAD WITHOUT CONTRAST CT CERVICAL SPINE WITHOUT CONTRAST TECHNIQUE: Multidetector CT imaging of the head and cervical spine was performed following the standard protocol without intravenous contrast. Multiplanar CT image reconstructions of the cervical spine were also generated. COMPARISON:  None. FINDINGS: CT HEAD FINDINGS Brain: No evidence of large-territorial acute infarction. No parenchymal hemorrhage. No mass lesion. No extra-axial collection. No mass effect or midline shift. No hydrocephalus. Basilar cisterns are patent. Vascular: No hyperdense vessel. Atherosclerotic calcifications are present within the cavernous internal carotid arteries. Skull: No acute fracture or focal lesion. Sinuses/Orbits: Paranasal sinuses and mastoid air cells are clear. The orbits are unremarkable. Other: None. CT CERVICAL SPINE FINDINGS Alignment: Normal. Skull base and vertebrae:  Multilevel degenerative changes of the spine with multilevel severe osseous neural foraminal stenosis. No severe osseous central canal stenosis. No acute fracture. No aggressive appearing focal osseous lesion or focal pathologic process. Soft tissues and spinal canal: No prevertebral fluid or swelling. No visible canal hematoma. Upper chest: Unremarkable. Other: Atherosclerotic plaque carotid arteries within the neck. IMPRESSION: 1. No acute intracranial abnormality. 2. No acute displaced fracture or traumatic listhesis of the cervical spine. 3. Multilevel degenerative changes of the spine with multilevel severe osseous neural foraminal stenosis. Electronically Signed   By: Iven Finn M.D.   On: 11/07/2020 20:38    EKG: Independently reviewed.  Sinus rhythm rate 86 with PACs   Labs on Admission: I have personally reviewed the available labs and imaging studies at the time of the admission.  Pertinent labs: Sodium 130, hemoglobin 8.6.  Urinalysis positive for nitrates.  CT head and CT C-spine shows no acute findings     Assessment/Plan: Ataxia and inability ambulate: The patient will be admitted to the medical/surgical floor under observation status.  MRI of the head and lumbar spine are pending.  Can consider neurology/Ortho consult based off these results.  PT/OT has been ordered.  Hypovolemic hyponatremia: Likely due to dehydration.  Start maintenance IV fluids with normal saline at 75 cc an hour. Recheck BMP with AM labs.  Acute on chronic anemia: Hemoccult was negative.  Obtain iron studies with morning labs.  Other chronic conditions: Coronary artery disease, hypertension, hyperlipidemia: Resume home medications once med rec completed.  Level of Care: MedSurg DVT prophylaxis: Lovenox subcu Code Status: Full code Consults: None Admission status:  Observation   Leslee Home DO [facility]   How to contact the [facility] Attending or Consulting provider [place] or  covering  provider during after hours [place], for this patient?  Check the care team in [facility] and look for a) attending/consulting [facility] provider listed and b) the [facility] team listed Log into [email or web address] and use [place]'s universal password to access. If you do not have the password, please contact the hospital operator. Locate the [facility] provider you are looking for under [facility] and page to a number that you can be directly reached. If you still have difficulty reaching the provider, please page the [facility] (Director on Call) for the Hospitalists listed on amion for assistance.   11/07/2020, 10:34 PM

## 2020-11-08 ENCOUNTER — Observation Stay: Payer: Medicare HMO

## 2020-11-08 DIAGNOSIS — R262 Difficulty in walking, not elsewhere classified: Secondary | ICD-10-CM | POA: Diagnosis not present

## 2020-11-08 LAB — PHOSPHORUS: Phosphorus: 2.7 mg/dL (ref 2.5–4.6)

## 2020-11-08 LAB — GLUCOSE, CAPILLARY
Glucose-Capillary: 84 mg/dL (ref 70–99)
Glucose-Capillary: 117 mg/dL — ABNORMAL HIGH (ref 70–99)

## 2020-11-08 LAB — CBC
HCT: 24.8 % — ABNORMAL LOW (ref 36.0–46.0)
Hemoglobin: 7.6 g/dL — ABNORMAL LOW (ref 12.0–15.0)
MCH: 29.3 pg (ref 26.0–34.0)
MCHC: 30.6 g/dL (ref 30.0–36.0)
MCV: 95.8 fL (ref 80.0–100.0)
Platelets: 250 10*3/uL (ref 150–400)
RBC: 2.59 MIL/uL — ABNORMAL LOW (ref 3.87–5.11)
RDW: 19.2 % — ABNORMAL HIGH (ref 11.5–15.5)
WBC: 4.6 10*3/uL (ref 4.0–10.5)
nRBC: 0 % (ref 0.0–0.2)

## 2020-11-08 LAB — BASIC METABOLIC PANEL
Anion gap: 8 (ref 5–15)
BUN: 10 mg/dL (ref 8–23)
CO2: 23 mmol/L (ref 22–32)
Calcium: 8.9 mg/dL (ref 8.9–10.3)
Chloride: 104 mmol/L (ref 98–111)
Creatinine, Ser: 0.53 mg/dL (ref 0.44–1.00)
GFR, Estimated: >60 mL/min (ref >60)
Glucose, Bld: 69 mg/dL — ABNORMAL LOW (ref 70–99)
Potassium: 4.2 mmol/L (ref 3.5–5.1)
Sodium: 135 mmol/L (ref 135–145)

## 2020-11-08 LAB — VITAMIN B12: Vitamin B-12: 598 pg/mL (ref 180–914)

## 2020-11-08 LAB — FERRITIN: Ferritin: 100 ng/mL (ref 11–307)

## 2020-11-08 LAB — IRON AND TIBC
Iron: 15 ug/dL — ABNORMAL LOW (ref 28–170)
Saturation Ratios: 5 % — ABNORMAL LOW (ref 10.4–31.8)
TIBC: 290 ug/dL (ref 250–450)
UIBC: 275 ug/dL

## 2020-11-08 LAB — MAGNESIUM: Magnesium: 1.9 mg/dL (ref 1.7–2.4)

## 2020-11-08 LAB — VITAMIN D 25 HYDROXY (VIT D DEFICIENCY, FRACTURES): Vit D, 25-Hydroxy: 37.17 ng/mL (ref 30–100)

## 2020-11-08 LAB — FOLATE: Folate: 2.9 ng/mL — ABNORMAL LOW (ref >5.9)

## 2020-11-08 IMAGING — MR MR CERVICAL SPINE WO/W CM
13 of 23 series · 19 of 48 positions shown · IV contrast (gadavist)
Comparison: CT of the cervical spine [DATE].

CLINICAL DATA: Myelopathy, acute or progressive; CSF leak
suspected/ Spontaneous intracranial hypotension

EXAM:
MRI CERVICAL AND THORACIC SPINE WITHOUT AND WITH CONTRAST
TECHNIQUE: Multiplanar and multiecho pulse sequences of the cervical spine, to
include the craniocervical junction and cervicothoracic junction,
and the thoracic spine, were obtained without and with intravenous
contrast.
CONTRAST:  5mL GADAVIST GADOBUTROL 1 MMOL/ML IV SOLN

[Series 18: T1 · sagittal · 6.0mm · 1.41mm/px · 1 of 9 slices shown (1 of 3)]
[im 1/9]
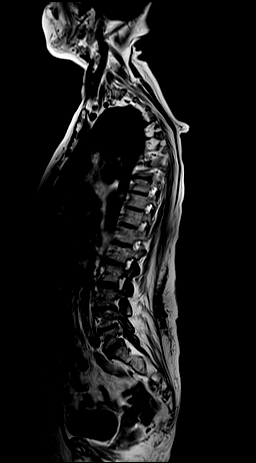

[Series 19: T2 · sagittal · 3.0mm · 1.06mm/px · 1 of 17 slices shown (1 of 4)]
[im 1/17]
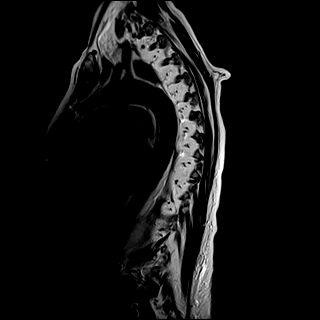

[Series 20: T1 · sagittal · 3.0mm · 1.06mm/px · 1 of 17 slices shown (2 of 3)]
[im 1/17]
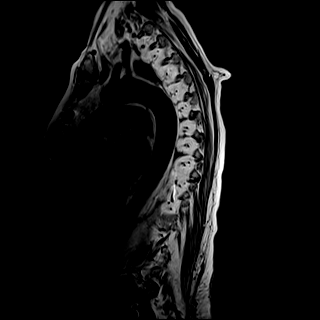

[Series 21: STIR · sagittal · 3.0mm · 0.53mm/px · 1 of 17 slices shown (1 of 2)]
[im 1/17]
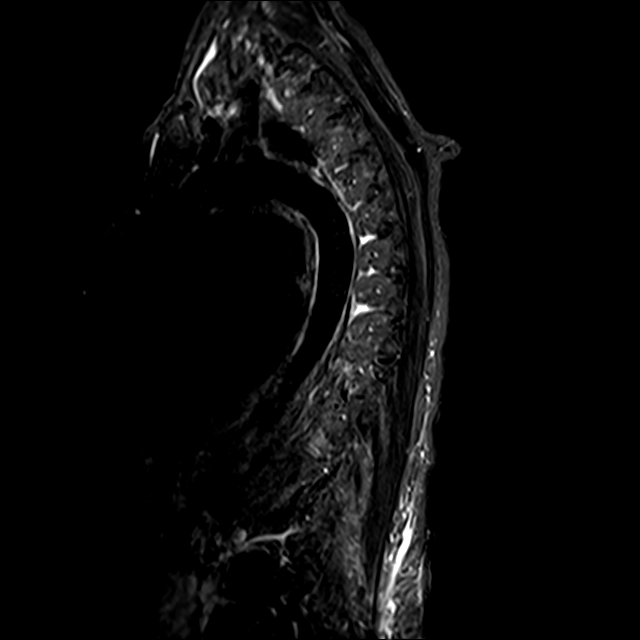

[Series 22: T2 · axial · 4.0mm · 0.59mm/px · z∈[-211,-24]mm · 2 of 39 slices shown (2 of 4)]
[im 1/39]
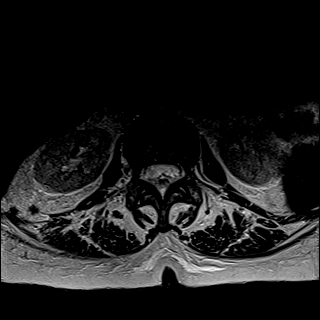
[im 39/39]
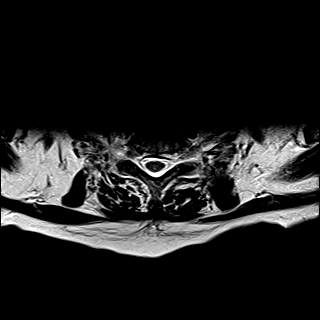

[Series 24: T1 post-contrast · axial · non-contrast · 4.0mm · 0.37mm/px · z∈[-211,-24]mm · 3 of 39 slices shown (1 of 3)]
[im 1/39]
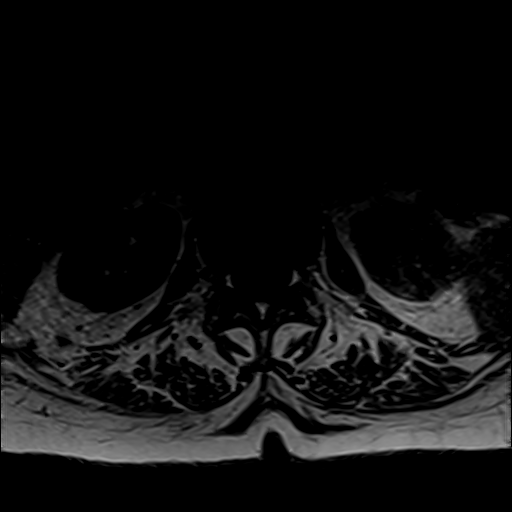
[im 20/39]
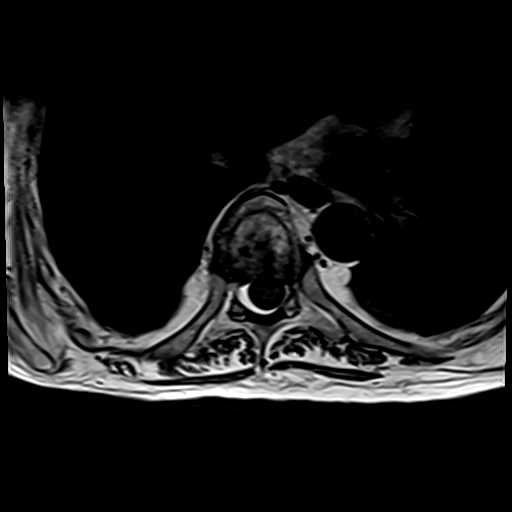
[im 39/39]
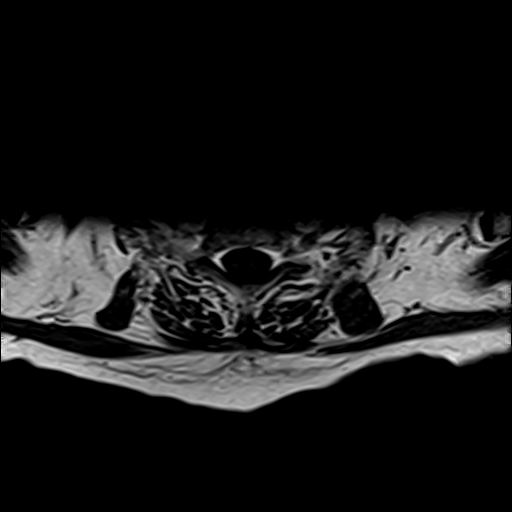

[Series 25: T2 · sagittal · 3.0mm · 0.62mm/px · 1 of 15 slices shown (3 of 4)]
[im 1/15]
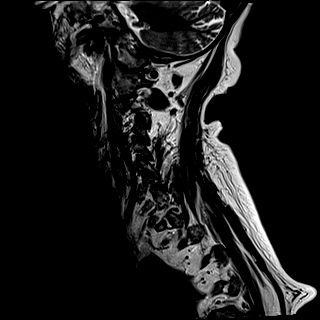

[Series 27: STIR · sagittal · 3.0mm · 0.62mm/px · 1 of 15 slices shown (2 of 2)]
[im 1/15]
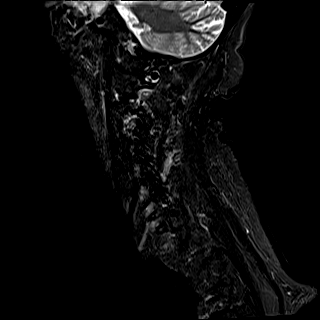

[Series 28: T2 · axial · 3.0mm · 0.70mm/px · z∈[-28,+64]mm · 2 of 27 slices shown (4 of 4)]
[im 1/27]
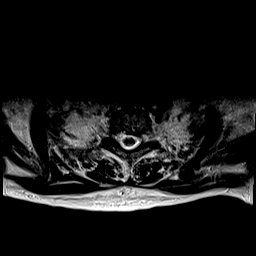
[im 27/27]
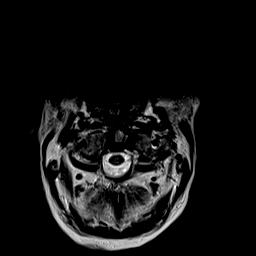

[Series 32: T1 · axial · non-contrast · 3.0mm · 0.35mm/px · z∈[-28,+64]mm · 2 of 29 slices shown (3 of 3)]
[im 1/29]
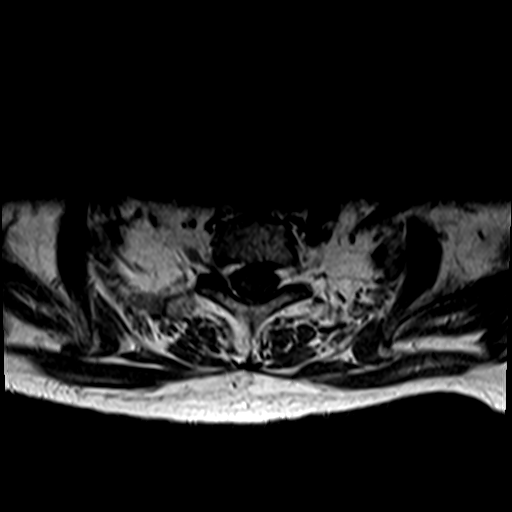
[im 29/29]
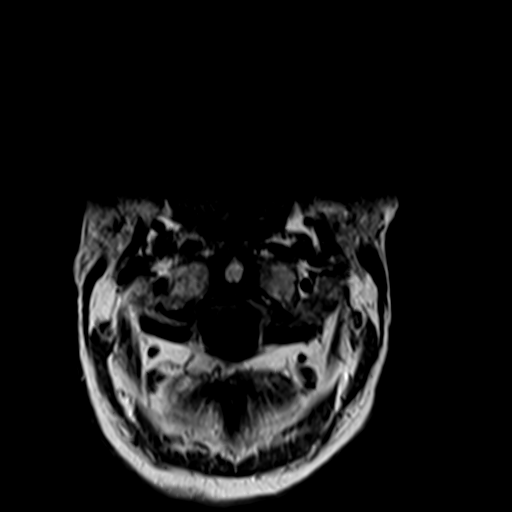

[Series 40: T1 post-contrast · axial · 3.0mm · 0.35mm/px · z∈[-28,+64]mm · 2 of 29 slices shown (2 of 3)]
[im 1/29]
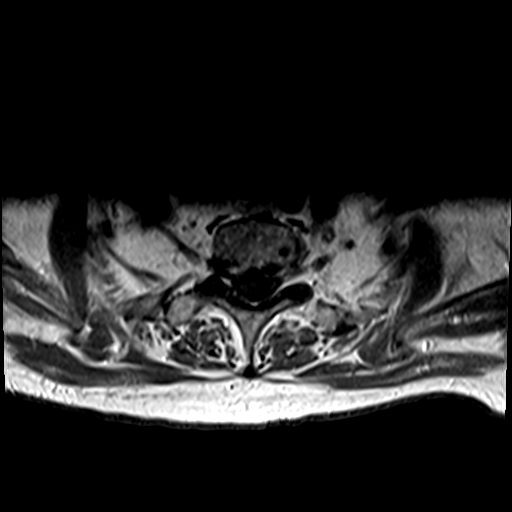
[im 29/29]
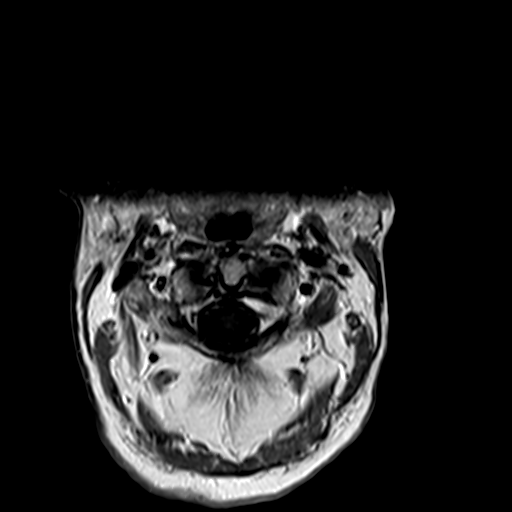

[Series 41: T1 post-contrast · axial · 4.0mm · 0.37mm/px · 1 of 39 slices shown (3 of 3)]
[im 1/39]
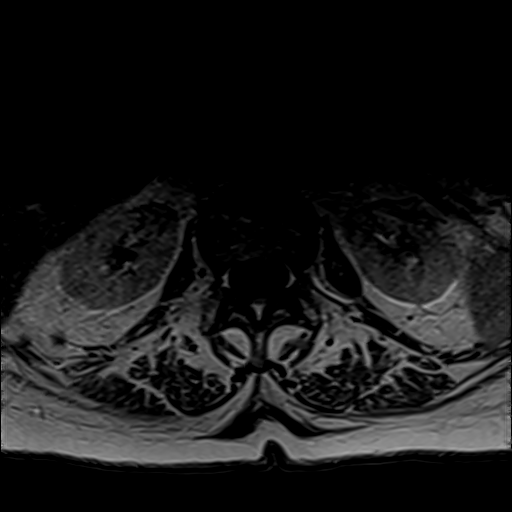

[Series 42: T1 fat-sat post-contrast · sagittal · 3.0mm · 1.06mm/px · 1 of 17 slices shown]
[im 1/17]
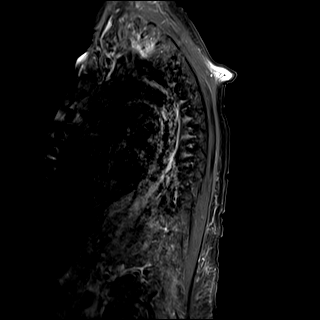

[19 of 48 positions shown; findings below may reference images not displayed]

brain MRI
performed earlier today [DATE]. Lumbar spine MRI performed
earlier today [DATE].
FINDINGS: Mild intermittent motion degradation.

MRI CERVICAL SPINE FINDINGS

Alignment: Straightening of the expected cervical lordosis. Trace
C2-C3, C3-C4, C4-C5, C5-C6 and C6-C7 grade 1 retrolisthesis.

Vertebrae: Vertebral body height is maintained. Multilevel
degenerative endplate irregularity and mixed degenerative endplate
marrow signal. This includes mild degenerative endplate edema and
enhancement at C2-C3, C3-C4, C4-C5, C5-C6 and C6-C7.

Cord: There is T2/STIR hyperintense signal abnormality within the
dorsal columns throughout much of the cervical spinal cord. There is
corresponding restricted diffusion within the dorsal columns, at
least at the upper cervical levels. No abnormal cord enhancement is
identified. Multilevel spinal cord impingement, as described below.
There is limited assessment for spinal cord signal abnormality at
the C3-C4, C4-C5 and C5-C6 levels due to the degree of cord
compression.

Posterior Fossa, vertebral arteries, paraspinal tissues: Posterior
fossa better assessed on brain MRI performed earlier today. Flow
voids preserved within the imaged cervical vertebral arteries.
Paraspinal soft tissues within normal limits.

Disc levels:

Multilevel disc degeneration. Most notably, there is
moderate/advanced disc degeneration at C2-C3, C3-C4, C4-C5, C5-C6
and C6-C7.

C2-C3: Trace grade 1 retrolisthesis. Disc bulge with bilateral disc
osteophyte ridge/uncinate hypertrophy. Ligamentum flavum
hypertrophy. Severe spinal canal stenosis with mild spinal cord
flattening. No significant foraminal stenosis.

C3-C4: Trace grade 1 retrolisthesis. Posterior disc osteophyte
complex with bilateral disc osteophyte ridge/uncinate hypertrophy.
Facet and ligamentum flavum hypertrophy. Severe spinal canal
stenosis with significant spinal cord impingement. Bilateral neural
foraminal narrowing (mild right, moderate left).

C4-C5: Trace grade 1 retrolisthesis. Posterior disc osteophyte
complex with bilateral disc osteophyte ridge/uncinate hypertrophy.
Facet and ligamentum flavum hypertrophy. Severe spinal canal
stenosis with significant spinal cord impingement. Moderate/severe
bilateral neural foraminal narrowing (greater on the left).

C5-C6: Trace grade 1 retrolisthesis. Posterior disc osteophyte
complex with bilateral disc osteophyte ridge/uncinate hypertrophy.
Facet arthrosis and ligamentum flavum hypertrophy. Severe spinal
canal stenosis with significant spinal cord impingement. Bilateral
neural foraminal narrowing (severe right, moderate/severe left).

C6-C7: Posterior disc osteophyte complex with bilateral disc
osteophyte ridge/uncinate hypertrophy. Minimal facet arthrosis and
ligamentum flavum hypertrophy. Mild relative spinal canal narrowing
without spinal cord mass effect. Moderate right neural foraminal
narrowing.

C7-T1: Shallow disc bulge. No significant spinal canal or foraminal
stenosis.

MRI THORACIC SPINE FINDINGS

Alignment: Thoracic dextrocurvature. Exaggerated thoracic kyphosis.
Trace T2-T3 grade 1 anterolisthesis.

Vertebrae: Mild chronic T12 superior endplate compression deformity.
Vertebral body height is otherwise maintained. Multilevel
degenerative endplate irregularity and mixed degenerative endplate
marrow signal. This includes mild degenerative endplate edema and
enhancement at T7-T8. Fusion across the disc spaces anteriorly at
T5-T6 and T6-T7.

Cord: There is T2/STIR hyperintense signal abnormality within the
dorsal columns throughout much of the thoracic spinal cord. No
abnormal cord enhancement is identified.

Paraspinal and other soft tissues: No abnormality identified within
included portions of the thorax or upper abdomen/retroperitoneum.
Paraspinal soft tissues unremarkable.

Disc levels:

Multilevel disc degeneration. Most notably, there is
moderate/advanced disc degeneration at T1-T2, T4-T5, T5-T6 and
T6-T7.

T1-T2: Disc bulge with endplate spurring. Minimal partial effacement
of the ventral thecal sac without spinal cord mass effect. Mild
right neural foraminal narrowing.

T2-T3: Trace grade 1 anterolisthesis. Disc uncovering with shallow
disc bulge. Minimal partial effacement of the ventral thecal sac
without spinal cord mass effect. No significant foraminal stenosis.

T3-T4: Shallow disc bulge. No significant spinal canal stenosis or
neural foraminal narrowing.

T4-T5: Shallow disc bulge. Mild partial effacement of ventral thecal
sac without significant spinal cord mass effect. No significant
foraminal stenosis.

T5-T6: Fusion across the ventral aspect of the disc space. Disc
bulge with endplate spurring. Partial effacement of the ventral
thecal sac with possible contact upon the ventral spinal cord. No
significant foraminal stenosis.

T6-T7: Disc bulge with endplate spurring. The disc bulge contacts
and flattens the ventral spinal cord. No significant foraminal
narrowing.

T7-T8: Shallow disc bulge with endplate spurring. No significant
spinal canal or foraminal stenosis.

T8-T9: Shallow disc bulge. No significant spinal canal or foraminal
stenosis.

T9-T10: No significant disc herniation or stenosis.

T10-T11: No significant disc herniation or stenosis.

T11-T12: Small disc bulge. Minimal partial effacement of the ventral
thecal sac without spinal cord mass effect. No significant foraminal
stenosis.

These results were called by telephone at the time of interpretation
on [DATE] at [DATE] to provider HSSANE , who
verbally acknowledged these results.
IMPRESSION: Cervical spine:

1. T2/STIR hyperintense signal abnormality within the dorsal columns
throughout much of the cervical spinal cord. Corresponding
restricted diffusion within the dorsal columns, at least at the
upper cervical levels. Findings are nonspecific and potential
differential considerations include B12 deficiency/subacute combined
degeneration, HSSANE deficiency, or syphilis. Cord infarction and
demyelinating disease are considered less likely.
2. Cervical spondylosis, as outlined and with findings most notably
as follows.
3. At C2-C3, there is multifactorial severe spinal canal stenosis
with mild spinal cord flattening.
4. At C3-C4, there is multifactorial severe spinal canal stenosis
with significant spinal cord impingement. Bilateral neural foraminal
narrowing (mild right, moderate left)
5. At C4-C5, there is multifactorial severe spinal canal stenosis
with significant spinal cord impingement. Moderate/severe bilateral
neural foraminal narrowing.
6. At C5-C6, there is multifactorial severe spinal canal stenosis
with significant spinal cord impingement. Bilateral neural foraminal
narrowing (severe right, moderate/severe left).

MRI thoracic spine:

1. T2/STIR hyperintense signal abnormality within the dorsal columns
throughout much of the thoracic spinal cord. Findings are
nonspecific and potential differential considerations include B12
deficiency/subacute combined degeneration, HSSANE deficiency, or
syphilis. Cord infarction and demyelinating disease are considered
less likely.
2. Thoracic spondylosis, as outlined.
3. At T6-T7, a disc bulge and associated endplate spurring contact
and mildly flatten the ventral spinal cord.
4. No more than mild relative spinal canal narrowing at the
remaining levels. No compressive foraminal stenosis.
5. Fusion across the disc spaces anteriorly at T5-T6 and T6-T7

## 2020-11-08 IMAGING — MR MR LUMBAR SPINE W/O CM
5 of 6 series · 29 of 48 positions shown · non-contrast
Comparison: MRI lumbar spine [DATE].

CLINICAL DATA: Severe low back pain. Inability to walk and
bilateral lower extremity weakness. No known injury.

EXAM:
MRI LUMBAR SPINE WITHOUT CONTRAST
TECHNIQUE: Multiplanar, multisequence MR imaging of the lumbar spine was
performed. No intravenous contrast was administered.

[Series 5: T2 · sagittal · 4.0mm · 0.81mm/px · 4 of 17 slices shown (1 of 2)]
[im 1/17]
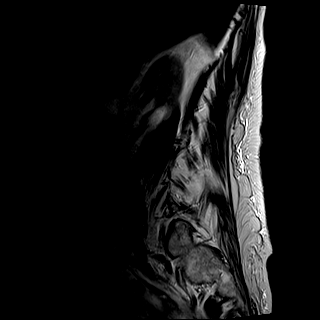
[im 6/17]
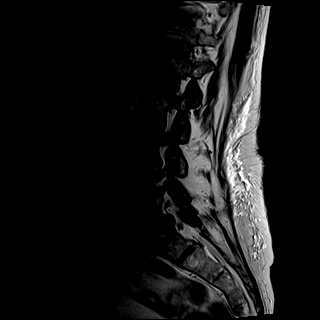
[im 11/17]
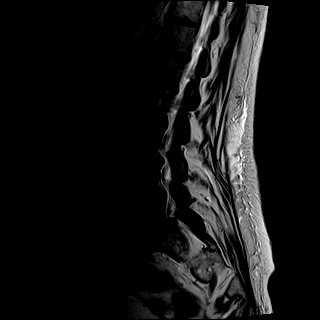
[im 17/17]
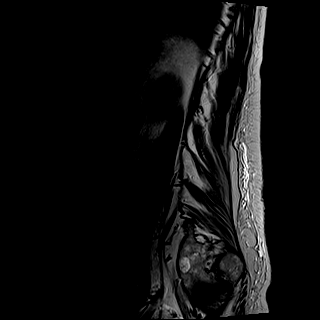

[Series 6: T1 · sagittal · 4.0mm · 0.81mm/px · 5 of 17 slices shown (1 of 2)]
[im 1/17]
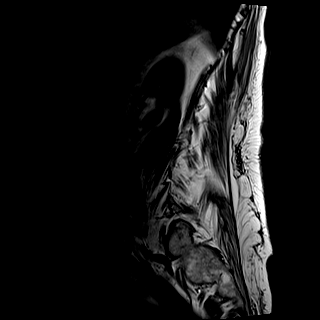
[im 5/17]
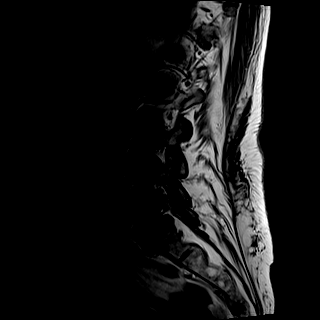
[im 9/17]
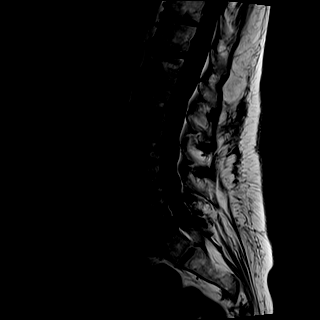
[im 13/17]
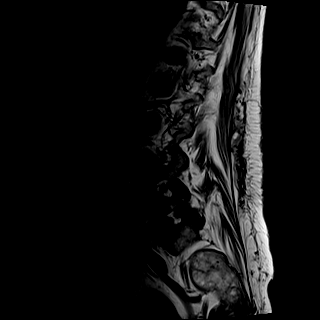
[im 17/17]
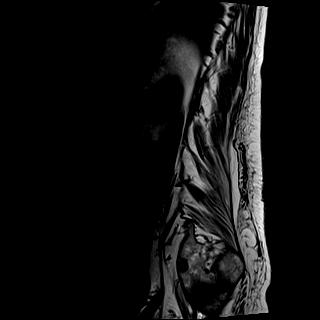

[Series 7: STIR · sagittal · 4.0mm · 0.41mm/px · 2 of 17 slices shown]
[im 1/17]
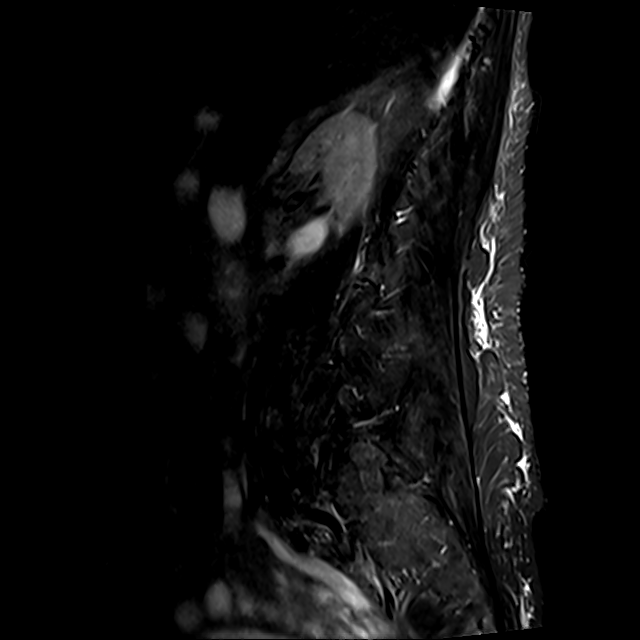
[im 5/17]
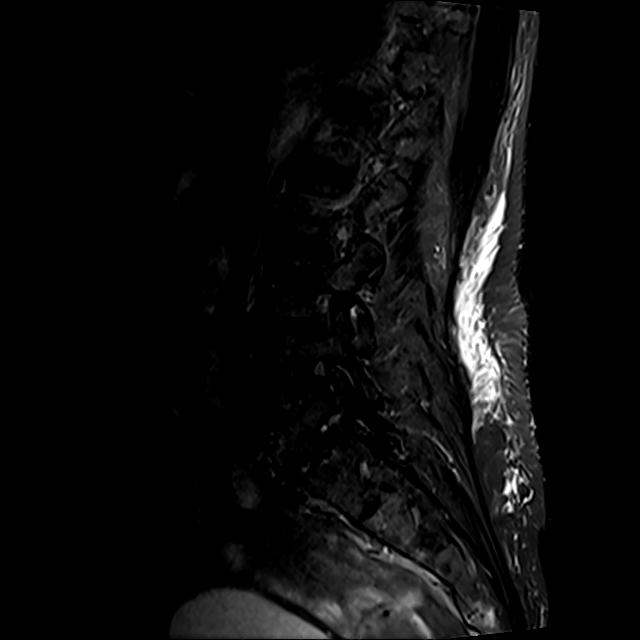

[Series 8: T2 · axial · 4.0mm · 0.78mm/px · z∈[-130,+56]mm · 9 of 34 slices shown (2 of 2)]
[im 1/34]
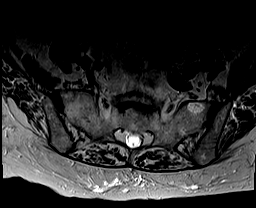
[im 5/34]
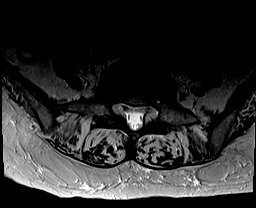
[im 9/34]
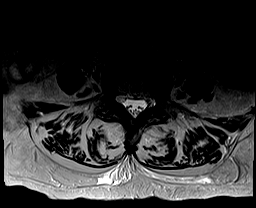
[im 13/34]
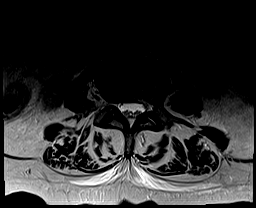
[im 17/34]
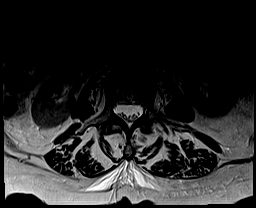
[im 21/34]
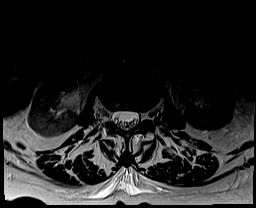
[im 25/34]
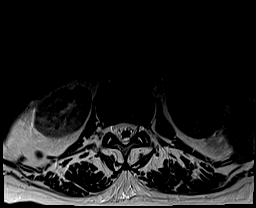
[im 29/34]
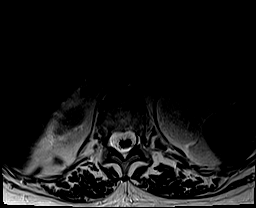
[im 34/34]
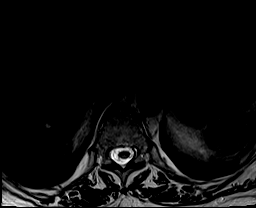

[Series 9: T1 · axial · 4.0mm · 0.39mm/px · z∈[-132,+55]mm · 9 of 34 slices shown (2 of 2)]
[im 1/34]
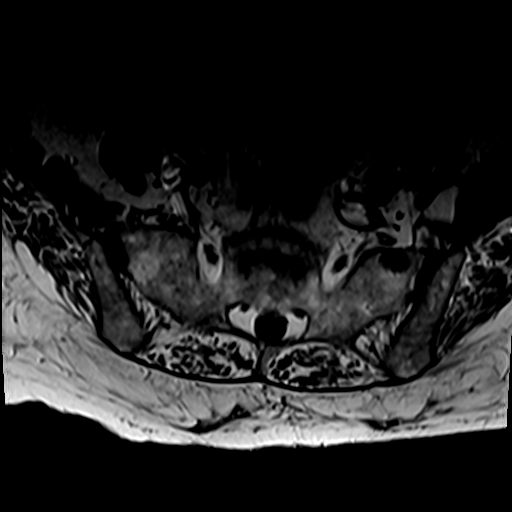
[im 5/34]
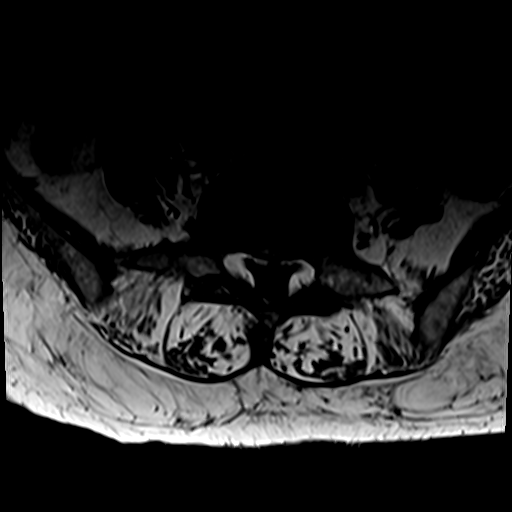
[im 9/34]
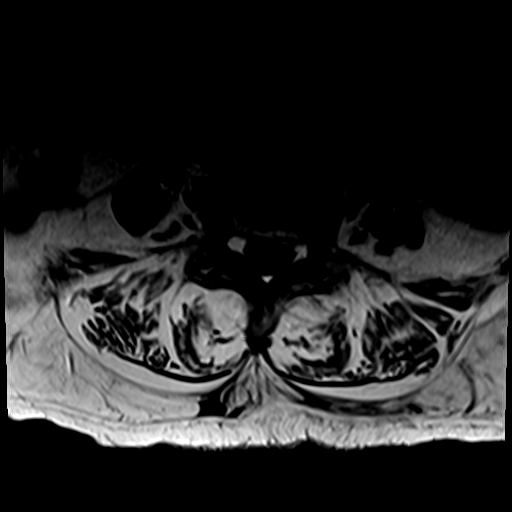
[im 13/34]
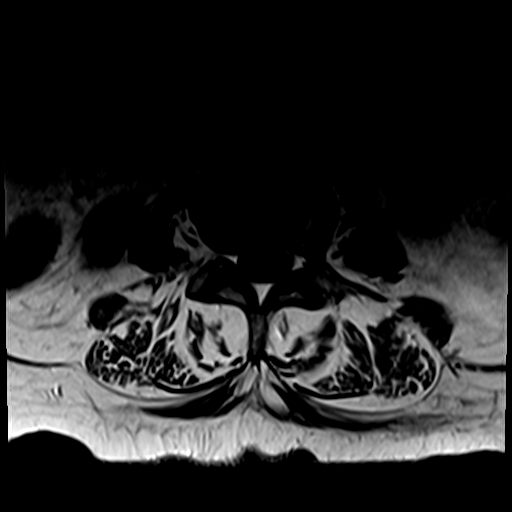
[im 17/34]
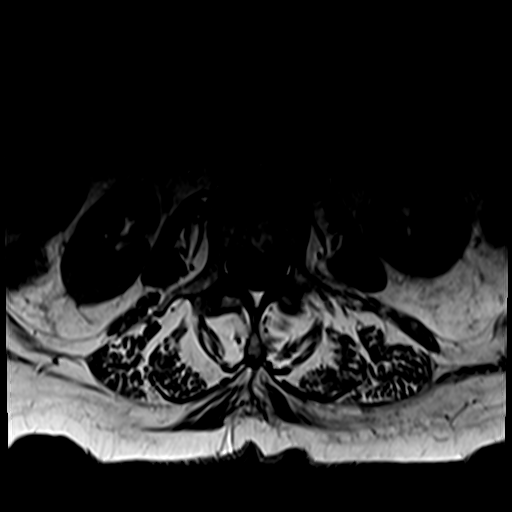
[im 21/34]
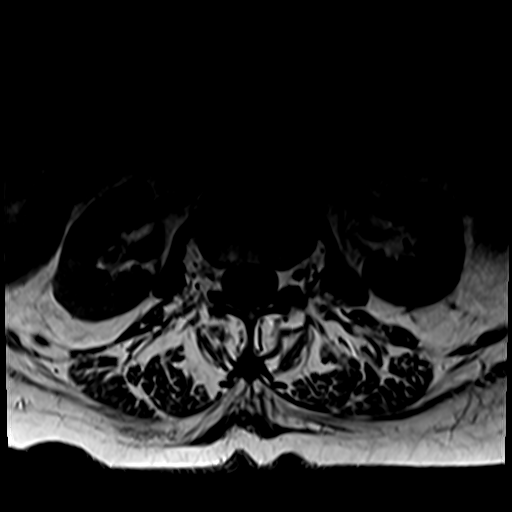
[im 25/34]
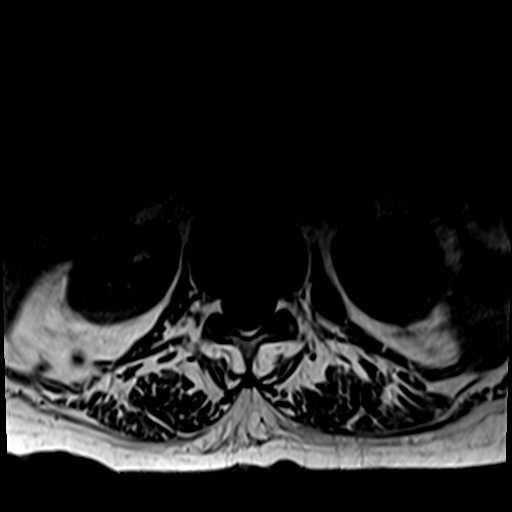
[im 29/34]
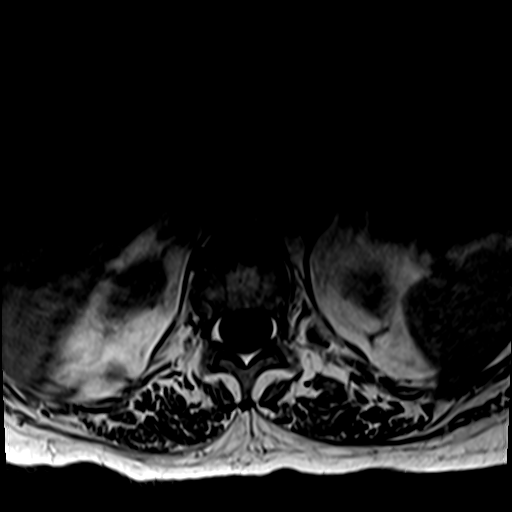
[im 34/34]
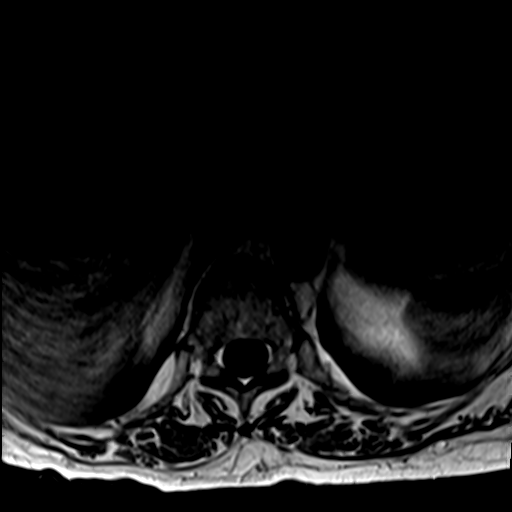

[29 of 48 positions shown; findings below may reference images not displayed]

FINDINGS: Segmentation:  Standard.

Alignment:  Trace retrolisthesis L1 on L2.  Otherwise maintained.

Vertebrae: No acute fracture, evidence of discitis, or bone lesion.
Mild, remote superior endplate compression fracture of L1 with
vertebral body height loss of 10-15% is again seen. Degenerative
endplate signal change is most notable at L4-5 and L5-S1 and
unchanged.

Conus medullaris and cauda equina: Conus extends to the L2-3 level.
Conus and cauda equina appear normal.

Paraspinal and other soft tissues: Negative.

Disc levels:

T11-12 is imaged in the sagittal plane only and negative.

T12-L1: Minimal disc bulge without stenosis.

L1-2: Minimal disc bulge without stenosis.

L2-3: Negative.

L3-4: Loss of disc space height with a shallow bulge, more prominent
to the left. No stenosis.

L4-5: Loss of disc space height with a shallow bulge and right
subarticular recess protrusion. There is some ligamentum flavum
thickening and mild facet arthropathy. Mild right foraminal
narrowing is unchanged. The central canal and left foramen are open.
No nerve root compression.

L5-S1: Shallow broad-based disc bulge and moderate facet
degenerative disease. Mild bilateral foraminal narrowing. The
central canal is open. No change.
IMPRESSION: Negative for acute fracture.  No change since the prior exam.

The central canal is patent at all levels with mild multilevel
degenerative disc disease as described above. Mild right foraminal
narrowing at L4-5 and mild bilateral foraminal narrowing at L5-S1
noted. No nerve root compression.

Mild, remote superior endplate compression fracture of L1.

## 2020-11-08 IMAGING — MR MR HEAD W/O CM
12 series · 43 of 48 positions shown · non-contrast
Comparison: Head CT [DATE].

CLINICAL DATA: Neuro deficit, acute, stroke suspected.

EXAM:
MRI HEAD WITHOUT CONTRAST
TECHNIQUE: Multiplanar, multiecho pulse sequences of the brain and surrounding
structures were obtained without intravenous contrast.

[Series 14: T2 · coronal · 5.0mm · 0.57mm/px · 2 of 29 slices shown (1 of 2)]
[im 1/29]
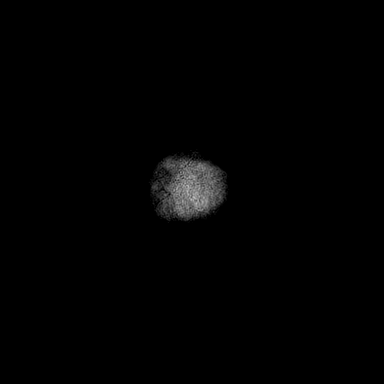
[im 29/29]
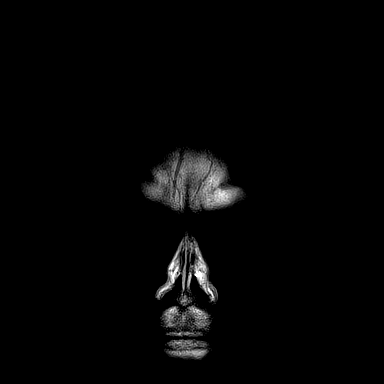

[Series 15: ax dwi_tracew · axial · 3.0mm · 0.65mm/px · z∈[-65,+96]mm · 6 of 100 slices shown]
[im 1/100]
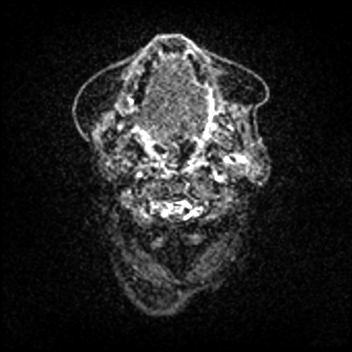
[im 20/100]
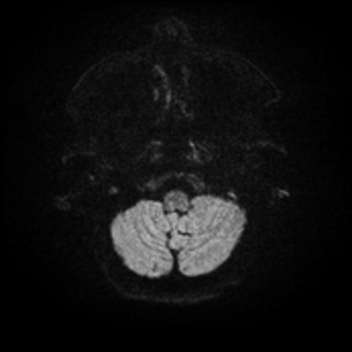
[im 40/100]
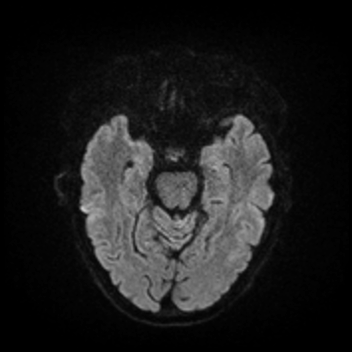
[im 60/100]
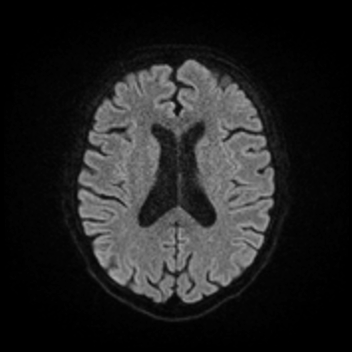
[im 80/100]
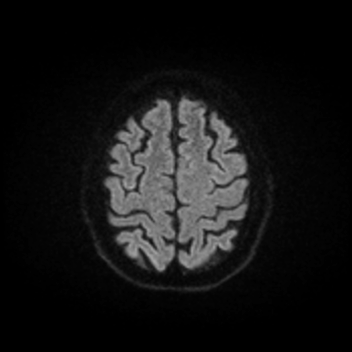
[im 100/100]
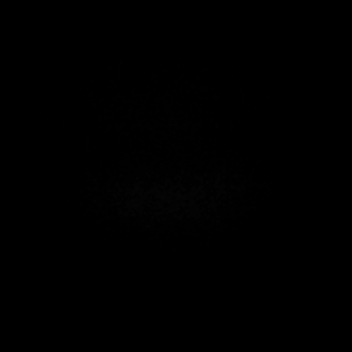

[Series 16: ax dwi_adc · axial · 3.0mm · 0.65mm/px · z∈[-65,+93]mm · 3 of 49 slices shown]
[im 1/49]
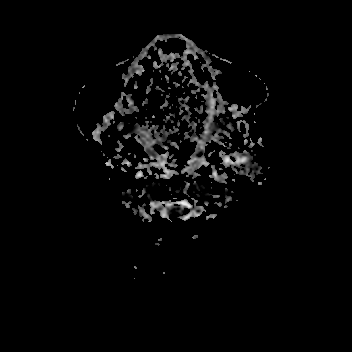
[im 25/49]
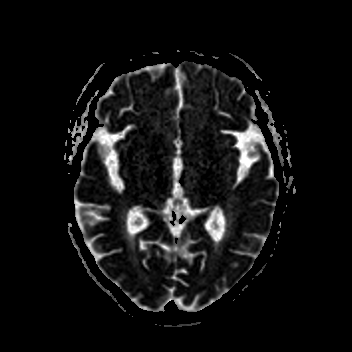
[im 49/49]
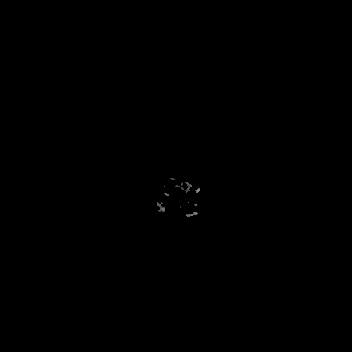

[Series 17: cor dwi_tracew · coronal · 5.0mm · 0.65mm/px · 5 of 72 slices shown]
[im 1/72]
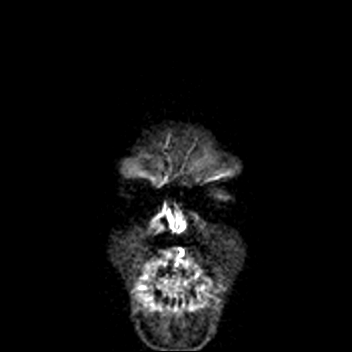
[im 18/72]
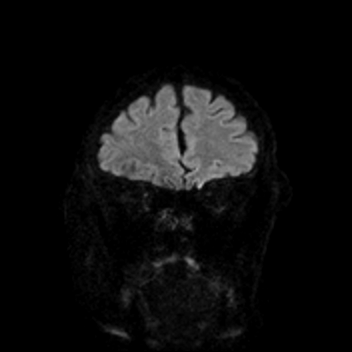
[im 36/72]
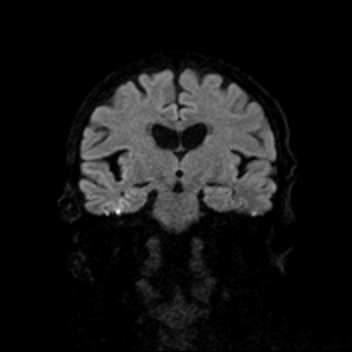
[im 54/72]
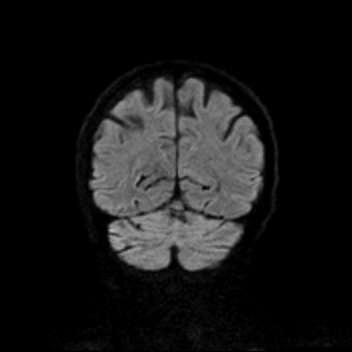
[im 72/72]
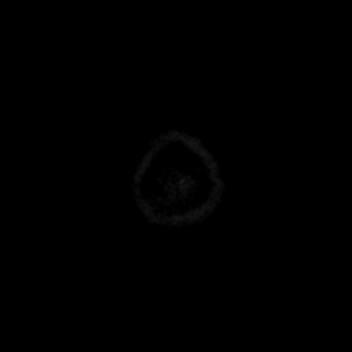

[Series 18: cor dwi_adc · coronal · 5.0mm · 0.65mm/px · 2 of 36 slices shown]
[im 1/36]
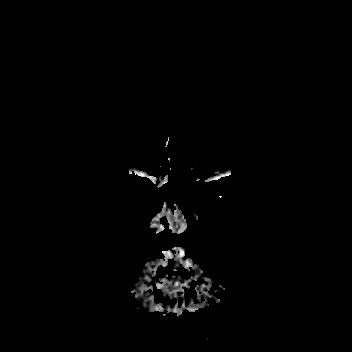
[im 36/36]
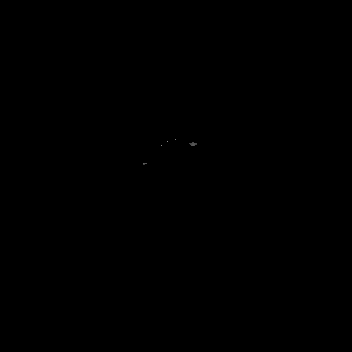

[Series 19: T1 · sagittal · 5.0mm · 0.62mm/px · 1 of 23 slices shown (1 of 2)]
[im 1/23]
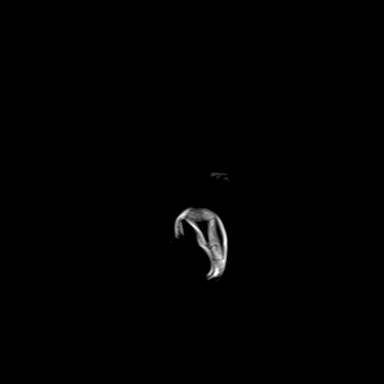

[Series 20: T2 · axial · 5.0mm · 0.53mm/px · z∈[-71,+91]mm · 2 of 28 slices shown (2 of 2)]
[im 1/28]
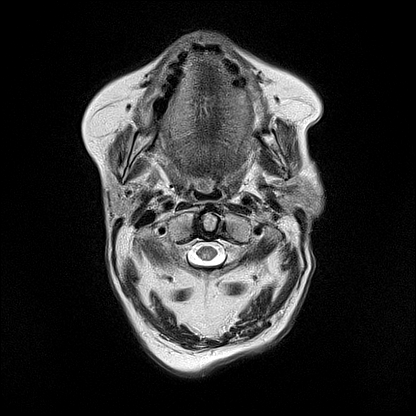
[im 28/28]
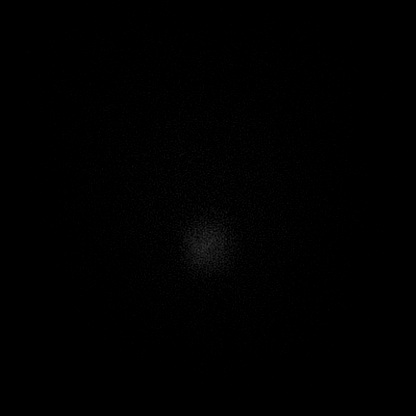

[Series 21: mag_images · axial · 3.0mm · 0.90mm/px · z∈[-78,+98]mm · 4 of 60 slices shown]
[im 1/60]
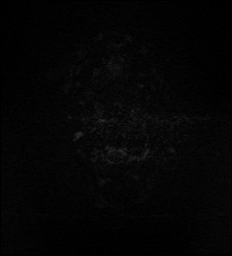
[im 20/60]
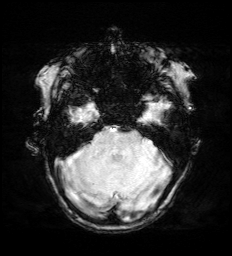
[im 40/60]
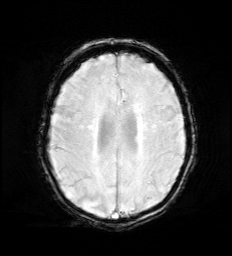
[im 60/60]
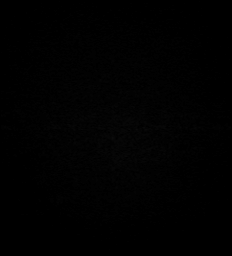

[Series 22: pha_images · axial · 3.0mm · 0.90mm/px · z∈[-75,+98]mm · 4 of 57 slices shown]
[im 1/57]
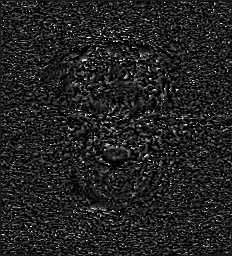
[im 19/57]
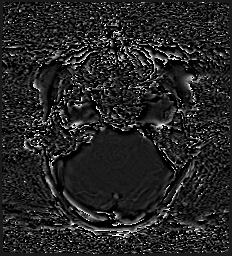
[im 38/57]
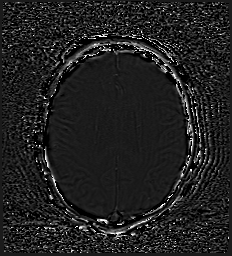
[im 57/57]
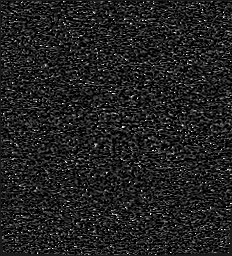

[Series 23: swi_images · axial · 3.0mm · 0.90mm/px · z∈[-78,-21]mm · 2 of 60 slices shown]
[im 1/60]
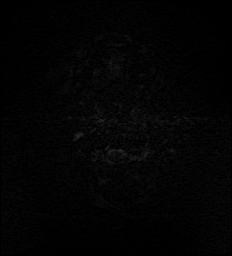
[im 20/60]
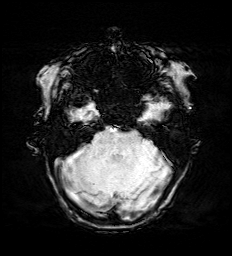

[Series 25: FLAIR · axial · 3.0mm · 0.53mm/px · z∈[-71,+91]mm · 4 of 55 slices shown]
[im 1/55]
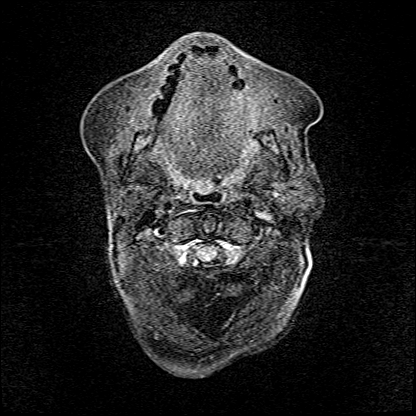
[im 19/55]
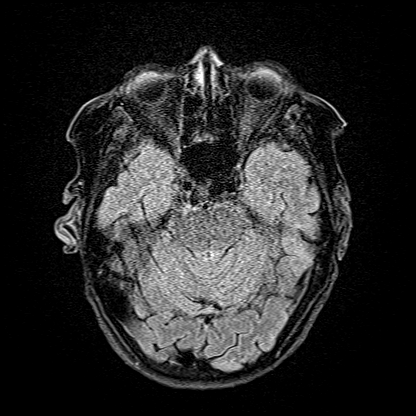
[im 37/55]
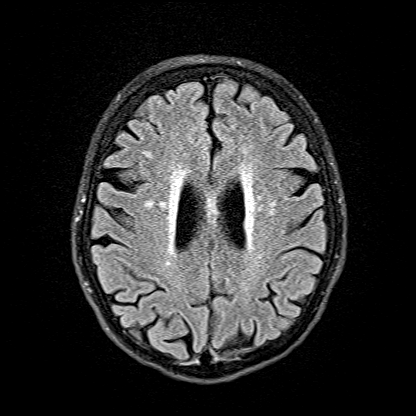
[im 55/55]
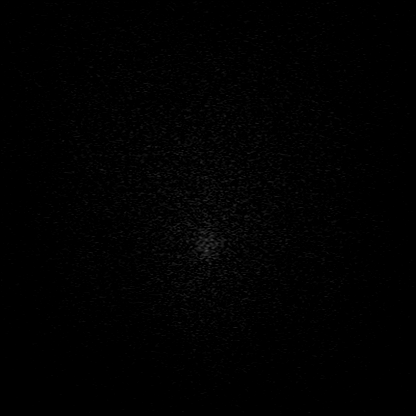

[Series 26: T1 · axial · 1.0mm · 0.98mm/px · z∈[-71,+103]mm · 8 of 176 slices shown (2 of 2)]
[im 1/176]
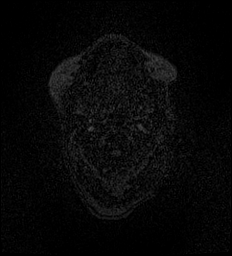
[im 36/176]
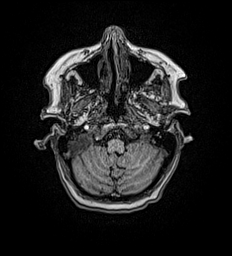
[im 53/176]
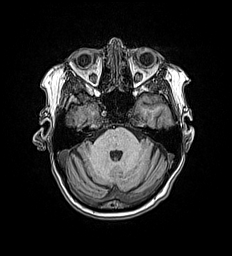
[im 71/176]
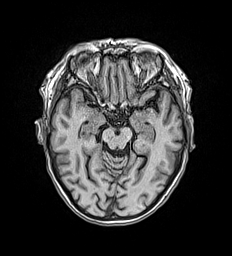
[im 106/176]
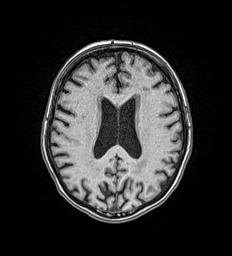
[im 123/176]
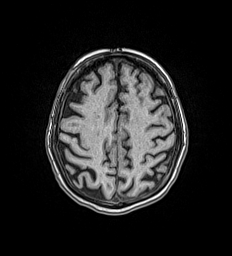
[im 141/176]
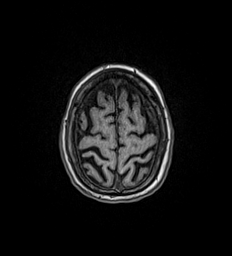
[im 176/176]
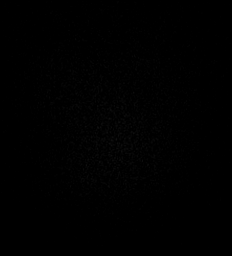

[43 of 48 positions shown; findings below may reference images not displayed]

FINDINGS: Brain:

Mild intermittent motion degradation.

Mild generalized cerebral and cerebellar atrophy.

Mild-to-moderate multifocal T2/FLAIR hyperintensity within the
cerebral white matter, nonspecific but compatible with chronic small
vessel ischemic disease.

Chronic lacunar infarct within the right thalamus.

Multiple small chronic infarcts within the bilateral cerebellar
hemispheres.

There are a few scattered supratentorial and infratentorial chronic
microhemorrhages.

There is no acute infarct.

No evidence of an intracranial mass.

No extra-axial fluid collection.

No midline shift.

Vascular: Expected proximal arterial flow voids.

Skull and upper cervical spine: No focal marrow lesion. Incompletely
assessed cervical spondylosis. Apparent T2 hyperintense signal
abnormality and abnormal restricted diffusion within the dorsal
columns of the partially imaged upper cervical spinal cord (for
instance as seen on series 20, image 1) (series 15, image 52).

Sinuses/Orbits: Visualized orbits show no acute finding. Bilateral
lens replacements. Trace bilateral ethmoid sinus mucosal thickening.

Impression #2 will be called to the ordering clinician or
representative by the Radiologist Assistant, and communication
documented in the PACS or [REDACTED].
IMPRESSION: No evidence of acute intracranial abnormality.

Apparent T2 hyperintense signal abnormality and abnormal restricted
diffusion within the dorsal columns of the partially imaged upper
cervical spinal cord. A dedicated cervical spine MRI (to include
diffusion weighted imaging) is recommended for further evaluation.

Mild-to-moderate chronic small vessel ischemic changes within the
cerebral white matter.

Chronic lacunar infarct within the right thalamus.

Multiple small chronic infarcts within the bilateral cerebellar
hemispheres.

Few scattered supratentorial and infratentorial chronic
microhemorrhages, nonspecific but likely reflecting sequela of
hypertensive microangiopathy.

Mild generalized parenchymal atrophy.

## 2020-11-08 MED ORDER — KETOROLAC TROMETHAMINE 15 MG/ML IJ SOLN
15.0000 mg | Freq: Once | INTRAMUSCULAR | Status: AC
  Administered 2020-11-08: 15 mg via INTRAVENOUS
  Filled 2020-11-08: qty 1

## 2020-11-08 MED ORDER — ROSUVASTATIN CALCIUM 10 MG PO TABS
10.0000 mg | ORAL_TABLET | Freq: Every day | ORAL | Status: DC
  Administered 2020-11-08 – 2020-11-18 (×11): 10 mg via ORAL
  Filled 2020-11-08 (×11): qty 1

## 2020-11-08 MED ORDER — ACETAMINOPHEN 325 MG PO TABS
650.0000 mg | ORAL_TABLET | Freq: Four times a day (QID) | ORAL | Status: AC
  Administered 2020-11-08 – 2020-11-09 (×6): 650 mg via ORAL
  Filled 2020-11-08 (×7): qty 2

## 2020-11-08 MED ORDER — SODIUM CHLORIDE 0.9 % IV SOLN
200.0000 mg | INTRAVENOUS | Status: AC
  Administered 2020-11-08 – 2020-11-12 (×5): 200 mg via INTRAVENOUS
  Filled 2020-11-08 (×5): qty 10

## 2020-11-08 MED ORDER — SERTRALINE HCL 50 MG PO TABS
100.0000 mg | ORAL_TABLET | Freq: Every day | ORAL | Status: DC
  Administered 2020-11-08 – 2020-11-18 (×11): 100 mg via ORAL
  Filled 2020-11-08 (×11): qty 2

## 2020-11-08 MED ORDER — EZETIMIBE 10 MG PO TABS
10.0000 mg | ORAL_TABLET | Freq: Every day | ORAL | Status: DC
  Administered 2020-11-08 – 2020-11-18 (×11): 10 mg via ORAL
  Filled 2020-11-08 (×11): qty 1

## 2020-11-08 MED ORDER — OXYCODONE HCL 5 MG PO TABS
10.0000 mg | ORAL_TABLET | Freq: Three times a day (TID) | ORAL | Status: DC | PRN
  Administered 2020-11-08 – 2020-11-11 (×10): 10 mg via ORAL
  Filled 2020-11-08 (×10): qty 2

## 2020-11-08 MED ORDER — DOXYLAMINE SUCCINATE (SLEEP) 25 MG PO TABS
25.0000 mg | ORAL_TABLET | Freq: Every evening | ORAL | Status: DC | PRN
  Filled 2020-11-08: qty 1

## 2020-11-08 MED ORDER — CALCIUM CARBONATE ANTACID 500 MG PO CHEW
1.0000 | CHEWABLE_TABLET | Freq: Two times a day (BID) | ORAL | Status: DC
  Administered 2020-11-08 – 2020-11-18 (×13): 200 mg via ORAL
  Filled 2020-11-08 (×15): qty 1

## 2020-11-08 MED ORDER — ASPIRIN EC 81 MG PO TBEC
81.0000 mg | DELAYED_RELEASE_TABLET | Freq: Every day | ORAL | Status: DC
  Administered 2020-11-08 – 2020-11-18 (×11): 81 mg via ORAL
  Filled 2020-11-08 (×11): qty 1

## 2020-11-08 MED ORDER — ENSURE ENLIVE PO LIQD
237.0000 mL | Freq: Two times a day (BID) | ORAL | Status: DC
  Administered 2020-11-09 – 2020-11-18 (×17): 237 mL via ORAL

## 2020-11-08 MED ORDER — ADULT MULTIVITAMIN W/MINERALS CH
1.0000 | ORAL_TABLET | Freq: Every day | ORAL | Status: DC
  Administered 2020-11-08 – 2020-11-13 (×6): 1 via ORAL
  Filled 2020-11-08 (×5): qty 1

## 2020-11-08 MED ORDER — CYCLOBENZAPRINE HCL 10 MG PO TABS
5.0000 mg | ORAL_TABLET | Freq: Three times a day (TID) | ORAL | Status: DC | PRN
  Administered 2020-11-09 – 2020-11-17 (×4): 5 mg via ORAL
  Filled 2020-11-08 (×4): qty 1

## 2020-11-08 MED ORDER — VITAMIN B-12 1000 MCG PO TABS
1000.0000 ug | ORAL_TABLET | Freq: Every day | ORAL | Status: DC
  Administered 2020-11-08 – 2020-11-09 (×2): 1000 ug via ORAL
  Filled 2020-11-08 (×3): qty 1

## 2020-11-08 MED ORDER — VITAMIN D 25 MCG (1000 UNIT) PO TABS
1000.0000 [IU] | ORAL_TABLET | Freq: Every day | ORAL | Status: DC
  Administered 2020-11-08 – 2020-11-18 (×12): 1000 [IU] via ORAL
  Filled 2020-11-08 (×12): qty 1

## 2020-11-08 MED ORDER — FOLIC ACID 1 MG PO TABS
1.0000 mg | ORAL_TABLET | Freq: Every day | ORAL | Status: DC
  Administered 2020-11-08 – 2020-11-09 (×2): 1 mg via ORAL
  Filled 2020-11-08 (×2): qty 1

## 2020-11-08 MED ORDER — PANTOPRAZOLE SODIUM 40 MG PO TBEC
40.0000 mg | DELAYED_RELEASE_TABLET | Freq: Every day | ORAL | Status: DC
  Administered 2020-11-08 – 2020-11-18 (×11): 40 mg via ORAL
  Filled 2020-11-08 (×11): qty 1

## 2020-11-08 MED ORDER — GABAPENTIN 100 MG PO CAPS
100.0000 mg | ORAL_CAPSULE | Freq: Three times a day (TID) | ORAL | Status: DC
  Administered 2020-11-08 – 2020-11-10 (×7): 100 mg via ORAL
  Filled 2020-11-08 (×7): qty 1

## 2020-11-08 MED ORDER — GADOBUTROL 1 MMOL/ML IV SOLN
5.0000 mL | Freq: Once | INTRAVENOUS | Status: AC | PRN
  Administered 2020-11-08: 5 mL via INTRAVENOUS

## 2020-11-08 NOTE — TOC Progression Note (Signed)
Transition of Care [facility]) - Progression Note    Patient Details  Name: [name] MRN: [id number] Date of Birth: 02-14-1946  Transition of Care [facility]) CM/SW [place], RN Phone Number: 11/08/2020, 3:49 PM  Clinical Narrative:    Went to the room to speak with the patient concerning the recommendation to go to SNF< the patient was off the floor, TOC to see the patient once she returns         Expected Discharge Plan and Services                                                 Social Determinants of Health (SDOH) Interventions    Readmission Risk Interventions No flowsheet data found.

## 2020-11-08 NOTE — Progress Notes (Signed)
Initial Nutrition Assessment  DOCUMENTATION CODES:  Not applicable  INTERVENTION:  Liberalize diet to regular for poor PO intake, normal lipid panel in May, and low BP today Ensure Enlive po BID, each supplement provides 350 kcal and 20 grams of protein MVI with minerals daily.  NUTRITION DIAGNOSIS:  Inadequate oral intake related to poor appetite as evidenced by meal completion < 50%, per patient/family report.  GOAL:  Patient will meet greater than or equal to 90% of their needs  MONITOR:  PO intake, Supplement acceptance  REASON FOR ASSESSMENT:  Malnutrition Screening Tool    ASSESSMENT:  75 y.o. female presented to ED with concerns for weakness and loss of appetite. Reports numbness and tingling to upper and lower extremities and that she has also been unable to ambulate for the past few days. PMH relevant for CAD (s/p CABGx3), HTN, HLD, PVD, GERD, Hx MI   Pt reports that intake has been poor x 4 days PTA. Has no desire to eat. Hx of depression - reported on admission it is lonely living by herself.  Pt working with therapy at first attempted visit and talking with MD at second attempt. Will defer interview and physical exam to follow-up assessment. Limited meals recorded so far this admission, but appetite appears fair. Minor weight loss is noted over the last 3 months (4.2%). Weight loss likely related to depression and poor appetite. Will add nutrition supplement and liberalize diet. Lipid panel normal last month and hypotension this admission.  Average Meal Intake: 7/21-7/22: 50% intake x 1 recorded meals  Nutritionally Relevant Medications: Scheduled Meds:  cholecalciferol  1,000 Units Oral Daily   ezetimibe  10 mg Oral Daily   pantoprazole  40 mg Oral Daily   rosuvastatin  10 mg Oral Daily   vitamin B-12  1,000 mcg Oral Daily   Continuous Infusions:  sodium chloride 75 mL/hr at 11/08/20 0028   iron sucrose     Labs Reviewed  NUTRITION - FOCUSED PHYSICAL  EXAM: Defer to in-person assessment  Diet Order:   Diet Order             Diet Heart Room service appropriate? Yes; Fluid consistency: Thin  Diet effective now                   EDUCATION NEEDS:  No education needs have been identified at this time  Skin:  Skin Assessment: Reviewed RN Assessment  Last BM:  PTA  Height:  Ht Readings from Last 1 Encounters:  11/07/20 5' (1.524 m)    Weight:  Wt Readings from Last 1 Encounters:  11/07/20 50 kg    Ideal Body Weight:  45.5 kg  BMI:  Body mass index is 21.53 kg/m.  Estimated Nutritional Needs:  Kcal:  1400-1600 kcal/d Protein:  75-85 g/d Fluid:  >1500 mL/d   Ranell Patrick, RD, LDN Clinical Dietitian Pager on [place]

## 2020-11-08 NOTE — Progress Notes (Addendum)
[facility] Progress Note  Patient: [name]    [id number]  DOA: 11/07/2020     Date of Service: the patient was seen and examined on 11/08/2020  Chief Complaint  Patient presents with   Weakness   Brief hospital course: [name] is a 75 y.o. female with a past medical history of coronary artery disease, history of DVT after childbirth, hypertension, hyperlipidemia, chronic low back pain, ambulatory dysfunction.  The patient presents to the emergency department due to numbness and tingling of bilateral lower extremities and upper extremities.  She has also been unable to ambulate at all for the past few days.   Assessment and Plan:  Ataxia and inability ambulate:  CT head and C-spine: No acute intracranial abnormality. No acute displaced fracture or traumatic listhesis of the cervical spine. Multilevel degenerative changes of the spine with multilevel severe osseous neural foraminal stenosis MRI brain and L- spine: No evidence of acute intracranial abnormality. T2 hyperintense signal abnormality and abnormal restricted diffusion within the dorsal columns of the partially imaged upper cervical spinal cord. A dedicated cervical spine MRI (to include diffusion weighted imaging) is recommended for further evaluation. Mild-to-moderate chronic small vessel ischemic changes. Chronic lacunar infarct within the right thalamus. Multiple small chronic infarcts within the bilateral cerebellar hemispheres. Few scattered supratentorial and infratentorial chronic microhemorrhages, nonspecific but likely reflecting sequela of hypertensive microangiopathy.  -Neurology consulted  -PT/OT  eval rec SNF placement  -MRI C and T spine pending    Chronic back pain Continued oxycodone, Flexeril as needed and gabapentin home dose   Iron deficiency, iron saturation 5%, started Venofer 200 mg IV daily during hospital stay, start oral supplement on discharge.  Folic acid deficiency, started folic  acid 1 mg p.o. daily  Hypovolemic hyponatremia: Likely due to dehydration.  Start maintenance IV fluids with normal saline at 75 cc an hour. Recheck BMP with AM labs. Na 135 improved   Acute on chronic anemia: Hemoccult was negative.  Obtain iron studies with morning labs.   Other chronic conditions: Coronary artery disease, hypertension, hyperlipidemia: Resumed home medications    Body mass index is 21.53 kg/m.  Nutrition Problem: Inadequate oral intake Etiology: poor appetite Interventions: Interventions: Refer to RD note for recommendations  Pressure Injury (Active)     Location:   Location Orientation:   Staging:   Wound Description (Comments):   Present on Admission:      Diet: Regular DVT Prophylaxis: Subcutaneous Lovenox   Advance goals of care discussion: Full code  Family Communication: family was NOT present at bedside, at the time of interview.  The pt provided permission to discuss medical plan with the family. Opportunity was given to ask question and all questions were answered satisfactorily.   Disposition:  Pt is from home, admitted with difficulty ambulation, bilateral feet neuropathy, still has weakness, PT and OT eval done, recommend SNF placement, which precludes a safe discharge. Discharge to SNF, when cleared by neurology.  Subjective: No overnight issues, patient was admitted due to difficulty ambulation and neuropathy, still has weakness and unable to get up and walk.  Patient has chronic neuropathy, bilateral hands and bilateral feet.  Patient has chronic lower back pain as well. Denied any chest pain or palpitation, no shortness of breath, no new focal weakness.  Physical Exam: General:  alert oriented to time, place, and person.  Appear in no distress, affect appropriate Eyes: PERRLA ENT: Oral Mucosa Clear, moist  Neck: no JVD,  Cardiovascular: S1 and S2 Present,  no Murmur,  Respiratory: good respiratory effort, Bilateral Air entry equal and  Decreased, no Crackles, no wheezes Abdomen: Bowel Sound present, Soft and no tenderness,  Skin: no rashes Extremities: no Pedal edema, no calf tenderness Neurologic: without any new focal findings, numbness bilateral hands and numbness of bilateral feet, left foot >  right foot Gait not checked due to patient safety concerns  Vitals:   11/08/20 0000 11/08/20 0520 11/08/20 0756 11/08/20 1238  BP: (!) 122/56 (!) 106/55 (!) 106/45 (!) 106/49  Pulse: 83 74 70 73  Resp: '17 17 18 19  '$ Temp: 98 F (36.7 C) 98 F (36.7 C) 98 F (36.7 C) 97.7 F (36.5 C)  TempSrc:  Oral    SpO2: 99% 100% 99% 96%  Weight:      Height:        Intake/Output Summary (Last 24 hours) at 11/08/2020 1543 Last data filed at 11/08/2020 1016 Gross per 24 hour  Intake 620 ml  Output --  Net 620 ml   Filed Weights   11/07/20 1518 11/07/20 1600  Weight: 54.4 kg 50 kg    Data Reviewed: I have personally reviewed and interpreted daily labs, tele strips, imagings as discussed above. I reviewed all nursing notes, pharmacy notes, vitals, pertinent old records I have discussed plan of care as described above with RN and patient/family.  CBC: Recent Labs  Lab 11/07/20 1522 11/08/20 0448  WBC 5.8 4.6  HGB 8.6* 7.6*  HCT 27.6* 24.8*  MCV 95.5 95.8  PLT 266 [id number]   Basic Metabolic Panel: Recent Labs  Lab 11/07/20 1522 11/08/20 0448 11/08/20 0834  NA 133* 135  --   K 4.0 4.2  --   CL 100 104  --   CO2 20* 23  --   GLUCOSE 88 69*  --   BUN 17 10  --   CREATININE 0.62 0.53  --   CALCIUM 9.5 8.9  --   MG  --   --  1.9  PHOS  --   --  2.7    Studies: CT Head Wo Contrast  Result Date: 11/07/2020 CLINICAL DATA:  Neck pain acute. weakness and loss of appetite for 4 days. Pt able to walk with assistance. EXAM: CT HEAD WITHOUT CONTRAST CT CERVICAL SPINE WITHOUT CONTRAST TECHNIQUE: Multidetector CT imaging of the head and cervical spine was performed following the standard protocol without intravenous  contrast. Multiplanar CT image reconstructions of the cervical spine were also generated. COMPARISON:  None. FINDINGS: CT HEAD FINDINGS Brain: No evidence of large-territorial acute infarction. No parenchymal hemorrhage. No mass lesion. No extra-axial collection. No mass effect or midline shift. No hydrocephalus. Basilar cisterns are patent. Vascular: No hyperdense vessel. Atherosclerotic calcifications are present within the cavernous internal carotid arteries. Skull: No acute fracture or focal lesion. Sinuses/Orbits: Paranasal sinuses and mastoid air cells are clear. The orbits are unremarkable. Other: None. CT CERVICAL SPINE FINDINGS Alignment: Normal. Skull base and vertebrae: Multilevel degenerative changes of the spine with multilevel severe osseous neural foraminal stenosis. No severe osseous central canal stenosis. No acute fracture. No aggressive appearing focal osseous lesion or focal pathologic process. Soft tissues and spinal canal: No prevertebral fluid or swelling. No visible canal hematoma. Upper chest: Unremarkable. Other: Atherosclerotic plaque carotid arteries within the neck. IMPRESSION: 1. No acute intracranial abnormality. 2. No acute displaced fracture or traumatic listhesis of the cervical spine. 3. Multilevel degenerative changes of the spine with multilevel severe osseous neural foraminal stenosis. Electronically Signed   By: Thomasena Edis  Mckinley Jewel M.D.   On: 11/07/2020 20:38   CT Cervical Spine Wo Contrast  Result Date: 11/07/2020 CLINICAL DATA:  Neck pain acute. weakness and loss of appetite for 4 days. Pt able to walk with assistance. EXAM: CT HEAD WITHOUT CONTRAST CT CERVICAL SPINE WITHOUT CONTRAST TECHNIQUE: Multidetector CT imaging of the head and cervical spine was performed following the standard protocol without intravenous contrast. Multiplanar CT image reconstructions of the cervical spine were also generated. COMPARISON:  None. FINDINGS: CT HEAD FINDINGS Brain: No evidence of  large-territorial acute infarction. No parenchymal hemorrhage. No mass lesion. No extra-axial collection. No mass effect or midline shift. No hydrocephalus. Basilar cisterns are patent. Vascular: No hyperdense vessel. Atherosclerotic calcifications are present within the cavernous internal carotid arteries. Skull: No acute fracture or focal lesion. Sinuses/Orbits: Paranasal sinuses and mastoid air cells are clear. The orbits are unremarkable. Other: None. CT CERVICAL SPINE FINDINGS Alignment: Normal. Skull base and vertebrae: Multilevel degenerative changes of the spine with multilevel severe osseous neural foraminal stenosis. No severe osseous central canal stenosis. No acute fracture. No aggressive appearing focal osseous lesion or focal pathologic process. Soft tissues and spinal canal: No prevertebral fluid or swelling. No visible canal hematoma. Upper chest: Unremarkable. Other: Atherosclerotic plaque carotid arteries within the neck. IMPRESSION: 1. No acute intracranial abnormality. 2. No acute displaced fracture or traumatic listhesis of the cervical spine. 3. Multilevel degenerative changes of the spine with multilevel severe osseous neural foraminal stenosis. Electronically Signed   By: Iven Finn M.D.   On: 11/07/2020 20:38   MR BRAIN WO CONTRAST  Result Date: 11/08/2020 CLINICAL DATA:  Neuro deficit, acute, stroke suspected. EXAM: MRI HEAD WITHOUT CONTRAST TECHNIQUE: Multiplanar, multiecho pulse sequences of the brain and surrounding structures were obtained without intravenous contrast. COMPARISON:  Head CT 11/07/2020. FINDINGS: Brain: Mild intermittent motion degradation. Mild generalized cerebral and cerebellar atrophy. Mild-to-moderate multifocal T2/FLAIR hyperintensity within the cerebral white matter, nonspecific but compatible with chronic small vessel ischemic disease. Chronic lacunar infarct within the right thalamus. Multiple small chronic infarcts within the bilateral cerebellar  hemispheres. There are a few scattered supratentorial and infratentorial chronic microhemorrhages. There is no acute infarct. No evidence of an intracranial mass. No extra-axial fluid collection. No midline shift. Vascular: Expected proximal arterial flow voids. Skull and upper cervical spine: No focal marrow lesion. Incompletely assessed cervical spondylosis. Apparent T2 hyperintense signal abnormality and abnormal restricted diffusion within the dorsal columns of the partially imaged upper cervical spinal cord (for instance as seen on series 20, image 1) (series 15, image 52). Sinuses/Orbits: Visualized orbits show no acute finding. Bilateral lens replacements. Trace bilateral ethmoid sinus mucosal thickening. Impression #2 will be called to the ordering clinician or representative by the Radiologist Assistant, and communication documented in the PACS or [employer]. IMPRESSION: No evidence of acute intracranial abnormality. Apparent T2 hyperintense signal abnormality and abnormal restricted diffusion within the dorsal columns of the partially imaged upper cervical spinal cord. A dedicated cervical spine MRI (to include diffusion weighted imaging) is recommended for further evaluation. Mild-to-moderate chronic small vessel ischemic changes within the cerebral white matter. Chronic lacunar infarct within the right thalamus. Multiple small chronic infarcts within the bilateral cerebellar hemispheres. Few scattered supratentorial and infratentorial chronic microhemorrhages, nonspecific but likely reflecting sequela of hypertensive microangiopathy. Mild generalized parenchymal atrophy. Electronically Signed   By: Kellie Simmering DO   On: 11/08/2020 08:05   MR LUMBAR SPINE WO CONTRAST  Result Date: 11/08/2020 CLINICAL DATA:  Severe low back pain. Inability to walk and  bilateral lower extremity weakness. No known injury. EXAM: MRI LUMBAR SPINE WITHOUT CONTRAST TECHNIQUE: Multiplanar, multisequence MR imaging of  the lumbar spine was performed. No intravenous contrast was administered. COMPARISON:  MRI lumbar spine 08/31/2019. FINDINGS: Segmentation:  Standard. Alignment:  Trace retrolisthesis L1 on L2.  Otherwise maintained. Vertebrae: No acute fracture, evidence of discitis, or bone lesion. Mild, remote superior endplate compression fracture of L1 with vertebral body height loss of 10-15% is again seen. Degenerative endplate signal change is most notable at L4-5 and L5-S1 and unchanged. Conus medullaris and cauda equina: Conus extends to the L2-3 level. Conus and cauda equina appear normal. Paraspinal and other soft tissues: Negative. Disc levels: T11-12 is imaged in the sagittal plane only and negative. T12-L1: Minimal disc bulge without stenosis. L1-2: Minimal disc bulge without stenosis. L2-3: Negative. L3-4: Loss of disc space height with a shallow bulge, more prominent to the left. No stenosis. L4-5: Loss of disc space height with a shallow bulge and right subarticular recess protrusion. There is some ligamentum flavum thickening and mild facet arthropathy. Mild right foraminal narrowing is unchanged. The central canal and left foramen are open. No nerve root compression. L5-S1: Shallow broad-based disc bulge and moderate facet degenerative disease. Mild bilateral foraminal narrowing. The central canal is open. No change. IMPRESSION: Negative for acute fracture.  No change since the prior exam. The central canal is patent at all levels with mild multilevel degenerative disc disease as described above. Mild right foraminal narrowing at L4-5 and mild bilateral foraminal narrowing at L5-S1 noted. No nerve root compression. Mild, remote superior endplate compression fracture of L1. Electronically Signed   By: Inge Rise M.D.   On: 11/08/2020 07:59    Scheduled Meds:  acetaminophen  650 mg Oral Q6H   aspirin EC  81 mg Oral Daily   cholecalciferol  1,000 Units Oral Daily   enoxaparin (LOVENOX) injection  40 mg  Subcutaneous Q24H   ezetimibe  10 mg Oral Daily   [START ON 11/09/2020] feeding supplement  237 mL Oral BID BM   gabapentin  100 mg Oral TID   multivitamin with minerals  1 tablet Oral Daily   pantoprazole  40 mg Oral Daily   rosuvastatin  10 mg Oral Daily   sertraline  100 mg Oral Daily   vitamin B-12  1,000 mcg Oral Daily   Continuous Infusions:  sodium chloride 75 mL/hr at 11/08/20 1154   iron sucrose 200 mg (11/08/20 1157)   PRN Meds: cyclobenzaprine, doxylamine (Sleep), oxyCODONE  Time spent: 35 minutes  Author: Val Riles. MD [facility] 11/08/2020 3:43 PM  To reach On-call, see care teams to locate the attending and reach out to them via [email or web address]. If 7PM-7AM, please contact night-coverage If you still have difficulty reaching the attending provider, please page the [facility] (Director on Call) for [facility] on amion for assistance.

## 2020-11-08 NOTE — Telephone Encounter (Signed)
Patient currently admitted at [facility]

## 2020-11-08 NOTE — Evaluation (Signed)
Physical Therapy Evaluation Patient Details Name: [name] MRN: [id number] DOB: 06/24/1945 Today's Date: 11/08/2020   History of Present Illness  Per MD notes, pt is a 75 y.o. female who presented to the emergency department due to numbness and tingling of bilateral lower extremities and upper extremities with a past medical history of coronary artery disease, history of DVT after childbirth, hypertension, hyperlipidemia, chronic low back pain, ambulatory dysfunction. MD assessment includes ataxia and inability to ambulate, hypovolemic hyponatremia, and acute on chronic anemia.  Clinical Impression  Pt was pleasant and motivated to participate during the session. Pt performed bed mobility and transfers with supervision-min guard A and verbal cues for sequencing during transfers. Pt limited with ambulation and OOB activities secondary to reported increased weakness and fatigue. Pt performed 10 reps bilaterally of standing marches requesting to sit down secondary to weakness. Pt able to make short steps to recliner from bed. Pt is limited with functional mobility secondary to significant weakness compared to baseline strength. Pt will benefit from PT services in a SNF setting upon discharge to safely address deficits listed in patient problem list for decreased caregiver assistance and eventual return to PLOF.      Follow Up Recommendations SNF    Equipment Recommendations  Other (comment) (TBD)    Recommendations for Other Services       Precautions / Restrictions Precautions Precautions: Fall Restrictions Weight Bearing Restrictions: No      Mobility  Bed Mobility Overal bed mobility: Needs Assistance Bed Mobility: Supine to Sit     Supine to sit: Supervision     General bed mobility comments: Increased time but no physical assist    Transfers Overall transfer level: Needs assistance Equipment used: Rolling walker (2 wheeled) Transfers: Sit to/from [employer] Sit to Stand: Min guard Stand pivot transfers: Min guard       General transfer comment: Min guard for increased safety. Verbal cues for sequencing and walker placement  Ambulation/Gait Ambulation/Gait assistance: Min guard Gait Distance (Feet): 2 Feet Assistive device: Rolling walker (2 wheeled) Gait Pattern/deviations: Step-to pattern;Decreased stride length;Decreased step length - left;Decreased step length - right;Wide base of support Gait velocity: decreased   General Gait Details: Verbal cues for sequencing and walker placement. Pt performed short steps with wide base of support.  Stairs            Wheelchair Mobility    Modified Rankin (Stroke Patients Only)       Balance Overall balance assessment: Needs assistance Sitting-balance support: Feet supported;No upper extremity supported Sitting balance-Leahy Scale: Good Sitting balance - Comments: Supervision sitting on EOB   Standing balance support: Bilateral upper extremity supported Standing balance-Leahy Scale: Fair Standing balance comment: Heavy reliance on UEs through RW                             Pertinent Vitals/Pain Pain Assessment: 0-10 Pain Score: 7  Pain Location: Low back pain Pain Descriptors / Indicators: Aching;Sore Pain Intervention(s): Limited activity within patient's tolerance;Monitored during session;Premedicated before session    [place] expects to be discharged to:: Private residence Living Arrangements: Alone Available Help at Discharge: Friend(s);Other (Comment);Family (Pt is unsure friends or family would be able to stay with her.) Type of Home: House Home Access: Stairs to enter Entrance Stairs-Rails: Can reach both Entrance Stairs-Number of Steps: 5 Home Layout: One level Home Equipment: Walker - 4 wheels;Wheelchair - [employer];Shower seat Additional Comments: Pt  primarily uses rollator at baseline and has another  walker, but unsure if standard walker or rolling walker w/2 wheels.    Prior Function Level of Independence: Independent with assistive device(s)         Comments: Pt ambulates community distances with rollator. Pt is modified independent with all ADLs. She reports using WC for last week secondary to [place] of falling since onset of symptoms. Pt has had friends assist her up/down stairs for the last week, but did not leave the house often. Pt reports a fall in the last week when holding on to door of fridge that opened causing her to lose balance but reports "sliding down to the floor".     Hand Dominance        Extremity/Trunk Assessment   Upper Extremity Assessment Upper Extremity Assessment: Generalized weakness (Grossly at least 4/5)    Lower Extremity Assessment Lower Extremity Assessment: Generalized weakness (Grossly at least 4/5)       Communication   Communication: No difficulties  Cognition Arousal/Alertness: Awake/alert Behavior During Therapy: WFL for tasks assessed/performed Overall Cognitive Status: Within Functional Limits for tasks assessed                                        General Comments      Exercises Total Joint Exercises Ankle Circles/Pumps: AROM;Both;10 reps;Supine Hip ABduction/ADduction: AROM;Both;10 reps;Supine;Strengthening Straight Leg Raises: AROM;Both;10 reps;Supine;Strengthening Long Arc Quad: AROM;Both;10 reps;Seated;Strengthening Knee Flexion: AROM;Both;10 reps;Seated;Strengthening (with manual resist) Marching in Standing: AROM;Strengthening;Both;10 reps;Standing Other Exercises Other Exercises: Static sitting 3-77mn for improved trunk control and balance. Other Exercises: Static standing 1-2 min for improved activity tolerance. Other Exercises: Pt education provided verbally to stay in walker while ambulating to have UEs for extra assistance when she feels like LEs are going to give out on her.   Assessment/Plan     PT Assessment Patient needs continued PT services  PT Problem List Decreased strength;Decreased activity tolerance;Decreased balance;Decreased mobility;Decreased knowledge of use of DME;Decreased safety awareness;Pain;Decreased knowledge of precautions       PT Treatment Interventions DME instruction;Gait training;Stair training;Functional mobility training;Therapeutic activities;Therapeutic exercise;Balance training;Patient/family education    PT Goals (Current goals can be found in the Care Plan section)  Acute Rehab PT Goals Patient Stated Goal: Walk PT Goal Formulation: With patient Time For Goal Achievement: 11/21/20 Potential to Achieve Goals: Good    Frequency Min 2X/week   Barriers to discharge Decreased caregiver support;Inaccessible home environment Stairs to enter house and unsure about caregiver support    Co-evaluation               AM-PAC PT "6 Clicks" Mobility  Outcome Measure Help needed turning from your back to your side while in a flat bed without using bedrails?: None Help needed moving from lying on your back to sitting on the side of a flat bed without using bedrails?: None Help needed moving to and from a bed to a chair (including a wheelchair)?: A Little Help needed standing up from a chair using your arms (e.g., wheelchair or bedside chair)?: A Little Help needed to walk in hospital room?: A Little Help needed climbing 3-5 steps with a railing? : A Lot 6 Click Score: 19    End of Session Equipment Utilized During Treatment: Gait belt Activity Tolerance: Patient tolerated treatment well Patient left: in chair;with call bell/phone within reach;with chair alarm set Nurse Communication: Mobility status (Notified  that pt felt she needed a new brief) PT Visit Diagnosis: Unsteadiness on feet (R26.81);Muscle weakness (generalized) (M62.81);History of falling (Z91.81);Difficulty in walking, not elsewhere classified (R26.2);Pain Pain - Right/Left:   (LBP) Pain - part of body:  (LBP)    Time: [id number] PT Time Calculation (min) (ACUTE ONLY): 38 min   Charges:   PT Evaluation $PT Eval Moderate Complexity: 1 Mod PT Treatments $Therapeutic Exercise: 8-22 mins        Dayton Scrape SPT 11/08/20, 1:28 PM

## 2020-11-08 NOTE — Evaluation (Signed)
Occupational Therapy Evaluation Patient Details Name: [name] MRN: [id number] DOB: [birth year] Today's Date: 11/08/2020    History of Present Illness Per MD notes, pt is a 75 y.o. female who presented to the emergency department due to numbness and tingling of bilateral lower extremities and upper extremities with a past medical history of coronary artery disease, history of DVT after childbirth, hypertension, hyperlipidemia, chronic low back pain, ambulatory dysfunction. MD assessment includes ataxia and inability to ambulate, hypovolemic hyponatremia, and acute on chronic anemia.   Clinical Impression   [name] was seen for OT evaluation this date. Prior to hospital admission, pt was MOD I for mobility and ADLs, endorses recent fall. Pt lives alone with friends available PRN. Pt presents to acute OT demonstrating impaired ADL performance and functional mobility 2/2 decreased activity tolerance and functional strength/balance deficits. Pt currently requires MIN A for LBD seated EOB. CGA + RW for ADL t/f. Pt requires BUE support for standing balance, unable to progress to standing grooming tasks. Pt would benefit from skilled OT to address noted impairments and functional limitations (see below for any additional details) in order to maximize safety and independence while minimizing falls risk and caregiver burden. Upon hospital discharge, recommend STR to maximize pt safety and return to PLOF.     Follow Up Recommendations  SNF    Equipment Recommendations  3 in 1 bedside commode    Recommendations for Other Services       Precautions / Restrictions Precautions Precautions: Fall Restrictions Weight Bearing Restrictions: No      Mobility Bed Mobility Overal bed mobility: Needs Assistance Bed Mobility: Sit to Supine     Supine to sit: Supervision Sit to supine: Supervision   General bed mobility comments: Increased time but no physical assist    Transfers Overall  transfer level: Needs assistance Equipment used: Rolling walker (2 wheeled) Transfers: Sit to/from [facility] Sit to Stand: Min guard Stand pivot transfers: Min guard       General transfer comment: Min guard for increased safety. Verbal cues for sequencing and walker placement    Balance Overall balance assessment: Needs assistance Sitting-balance support: Feet supported;No upper extremity supported Sitting balance-Leahy Scale: Good Sitting balance - Comments: Supervision sitting on EOB   Standing balance support: Bilateral upper extremity supported Standing balance-Leahy Scale: Fair Standing balance comment: Heavy reliance on UEs through RW                           ADL either performed or assessed with clinical judgement   ADL Overall ADL's : Needs assistance/impaired                                       General ADL Comments: MIN A for LBD seated EOB. CGA + RW for ADL t/f. Pt requires BUE support for standing balance, unable to progress to standing grooming tasks      Pertinent Vitals/Pain Pain Assessment: No/denies pain Pain Score: 7  Pain Location: Low back pain Pain Descriptors / Indicators: Aching;Sore Pain Intervention(s): Limited activity within patient's tolerance;Monitored during session;Premedicated before session     Hand Dominance     Extremity/Trunk Assessment Upper Extremity Assessment Upper Extremity Assessment: Overall WFL for tasks assessed   Lower Extremity Assessment Lower Extremity Assessment: Generalized weakness       Communication Communication Communication: No difficulties   Cognition  Arousal/Alertness: Awake/alert Behavior During Therapy: WFL for tasks assessed/performed Overall Cognitive Status: Within Functional Limits for tasks assessed                                     General Comments       Exercises Exercises: Other exercises Other Exercises: Pt educated re;  OT role, DME recs, d/c recs, falls prevention Other Exercises: LBD, sit<>stand, sit>sup, sitting/standing balance/tolerance, SPT Other Exercises: Pt education provided verbally to stay in walker while ambulating to have UEs for extra assistance when she feels like LEs are going to give out on her.   Shoulder Instructions      Home Living Family/patient expects to be discharged to:: Private residence Living Arrangements: Alone Available Help at Discharge: Friend(s);Available PRN/intermittently Type of Home: House Home Access: Stairs to enter [employer] of Steps: 5 Entrance Stairs-Rails: Can reach both Home Layout: One level     Bathroom Shower/Tub: Teacher, early years/pre: Standard Bathroom Accessibility: Yes   Home Equipment: Environmental consultant - 4 wheels;Wheelchair - [employer];Shower seat   Additional Comments: Pt primarily uses rollator at baseline and has another walker, but unsure if standard walker or rolling walker w/2 wheels.      Prior Functioning/Environment Level of Independence: Independent with assistive device(s)        Comments: Pt ambulates community distances with rollator. Pt is modified independent with all ADLs. She reports using WC for last week secondary to Mao of falling since onset of symptoms. Pt has had friends assist her up/down stairs for the last week, but did not leave the house often. Pt reports a fall in the last week when holding on to door of fridge that opened causing her to lose balance but reports "sliding down to the floor".        OT Problem List: Decreased range of motion;Decreased strength;Decreased activity tolerance;Impaired balance (sitting and/or standing);Decreased safety awareness      OT Treatment/Interventions: Self-care/ADL training;Therapeutic exercise;Energy conservation;DME and/or AE instruction;Therapeutic activities;Balance training;Patient/family education    OT Goals(Current goals can be found in  the care plan section) Acute Rehab OT Goals Patient Stated Goal: to walk OT Goal Formulation: With patient Time For Goal Achievement: 11/22/20 Potential to Achieve Goals: Good ADL Goals Pt Will Perform Grooming: with modified independence;standing (c LRAD PRN) Pt Will Transfer to Toilet: with modified independence;ambulating;regular height toilet (c LRAD PRN) Additional ADL Goal #1: Pt will Independently verbalize plan to implement x3 falls prevention strategies  OT Frequency: Min 2X/week   Barriers to D/C: Decreased caregiver support;Inaccessible home environment             AM-PAC OT "6 Clicks" Daily Activity     Outcome Measure Help from another person eating meals?: None Help from another person taking care of personal grooming?: A Little Help from another person toileting, which includes using toliet, bedpan, or urinal?: A Little Help from another person bathing (including washing, rinsing, drying)?: A Little Help from another person to put on and taking off regular upper body clothing?: None Help from another person to put on and taking off regular lower body clothing?: A Little 6 Click Score: 20   End of Session Equipment Utilized During Treatment: Rolling walker  Activity Tolerance: Patient tolerated treatment well Patient left: in bed;with call bell/phone within reach;with bed alarm set  OT Visit Diagnosis: Other abnormalities of gait and mobility (R26.89);Muscle weakness (generalized) (M62.81)  Time: [id number] OT Time Calculation (min): 8 min Charges:  OT General Charges $OT Visit: 1 Visit OT Evaluation $OT Eval Low Complexity: 1 Low  Dessie Coma, M.S. OTR/L  11/08/20, 2:19 PM  ascom [phone]

## 2020-11-08 NOTE — Consult Note (Signed)
Neurology Consultation Reason for Consult: Difficulty walking Referring Physician: Dwyane Dee, D  CC: Difficulty walking  History is obtained from: Patient  HPI: [name] is a 75 y.o. female with a history of hypertension, hyperlipidemia who presents with acute on subacute progressive gait dysfunction.  She states that she first noticed something wrong with her legs back in May, and had gradual progression but was still doing quite well up until fairly recently and then over the course of a few days had fairly significant decline to the point that she was unable to walk.  This was progressive over a few days as opposed to like a light switch flipping off.  She has noticed numbness and tingling of bilateral lower extremities, as well as her hands.  She denies any involvement above her neck, no double vision, trouble swallowing, or other symptoms.  She denies any history of syphilis, she takes B12 supplementation regularly.  She also takes a supplement called PreserVision which contains both copper and zinc.  She has had no recent changes to this. She denies any recent illness or history of episodes of numbness or weakness.  ROS: A 14 point ROS was performed and is negative except as noted in the HPI.  Past Medical History:  Diagnosis Date   Abnormal Pap smear of cervix    Dr. Prentice Docker    Coronary artery disease    a. 06/2009 MI/PCI to LCX and LAD; b. 01/2011 Occluded stents-->CABG x 3 ([facility]): LIMA->LAD, VG->LCX, VG->RPDA; c. 01/2012 Cath: LM 50, LAD 95ost, 198m LCX 95ost, 1034mRCA 3072mPDA small, LIMA->LAD ok, VG->LCX ok, VG->RPDA 100-->Med rx.   Degenerative disc disease, lumbar    L4/5 noted CT ab/pelvis 05/19/11    [place] age [age]GERD (gastroesophageal reflux disease)    Hyperlipidemia    Hypertension    under control   Macular degeneration    [facility]   Parathyroid adenoma    right 13 x 8 mm s/p  resection    PVD [place]peripheral vascular disease) [place]  a. 01/2010 s/p Aortofemoral bypass.     Family History  Problem Relation Age of Onset   Heart disease Father      Social History:  reports that she quit smoking about 11 years ago. Her smoking use included cigarettes. She has a 44.00 pack-year smoking history. She has never used smokeless tobacco. She reports current alcohol use of about 1.0 standard drink of alcohol per week. She reports that she does not use drugs.   Exam: Current vital signs: BP (!) 106/49 (BP Location: Left Arm)   Pulse 73   Temp 97.7 F (36.5 C)   Resp 19   Ht 5' (1.524 m)   Wt 50 kg   SpO2 96%   BMI 21.53 kg/m  Vital signs in last 24 hours: Temp:  [97.7 F (36.5 C)-98.6 F (37 C)] 97.7 F (36.5 C) (07/22 1238) Pulse Rate:  [65-89] 73 (07/22 1238) Resp:  [16-19] 19 (07/22 1238) BP: (99-132)/(45-68) 106/49 (07/22 1238) SpO2:  [95 %-100 %] 96 % (07/22 1238) Weight:  [50 kg-54.4 kg] 50 kg (07/21 1600)   Physical Exam  Constitutional: Appears well-developed and well-nourished.  Psych: Affect appropriate to situation Eyes: No scleral injection HENT: No OP obstruction MSK: no joint deformities.  Cardiovascular: Normal rate and regular rhythm.  Respiratory: Effort normal, non-labored breathing GI: Soft.  No distension. There is no tenderness.  Skin: WDI  Neuro: Mental Status: Patient is awake, alert, oriented to person, place, month, year, and situation. Patient is able to give a clear and coherent history. No signs of aphasia or neglect Cranial Nerves: II: Visual Fields are full.  Left pupil is slightly larger than right, both are reactive without APD III,IV, VI: EOMI without ptosis or diploplia.  V: Facial sensation is symmetric to temperature VII: Facial movement is symmetric.  VIII: hearing is intact to voice X: Uvula elevates symmetrically XI: Shoulder shrug is symmetric. XII: tongue is midline without atrophy or fasciculations.   Motor: Tone is normal. Bulk is normal. 5/5 strength was present in all four extremities.  Sensory: Sensation is markedly diminished to two-point discrimination in the legs, with relative preservation of temperature sensation.  She has absent proprioception at the toes, but is able to answer correctly at the ankle. Deep Tendon Reflexes: 2+ and symmetric in the biceps and patellae.  Reduced at the anklesCerebellar: She may have slight dysmetria on finger-nose-finger on bilateral arms, but only mild, with profound ataxia bilateral legs when attempting to perform heel-knee-shin.      I have reviewed labs in epic and the results pertinent to this consultation are: B12 528 Folate low at 2.9   I have reviewed the images obtained:MRI brain - concerning for posterior column lesion in the high cervical cord.   Impression: 75 year old female with progressive gait dysfunction, evidence of posterior column dysfunction on clinical exam and imaging findings concerning for this.  With a normal B12 level, B12 deficiency is less likely, copper deficiency can present in a similar way and is associated with zinc supplementation which the patient is taking (though she is also taking copper).  Syphilis did have a similar appearance but is typically more slowly progressive.  Posterior spinal artery stroke is also a possibility, though the gradual progression of her a few days would be unusual.  Demyelinating lesion would also be a possibility, though if it is truly is isolated to the posterior column as it appears to be, I think this is slightly less likely.  Recommendations: 1) MRI cervical and thoracic spine with and without contrast 2) copper, RPR 3) neurology will continue to follow  Roland Rack, MD [facility] [phone]  If 7pm- 7am, please page neurology on call as listed in [place].

## 2020-11-08 NOTE — Plan of Care (Signed)

## 2020-11-09 DIAGNOSIS — E610 Copper deficiency: Secondary | ICD-10-CM | POA: Diagnosis present

## 2020-11-09 DIAGNOSIS — E860 Dehydration: Secondary | ICD-10-CM | POA: Diagnosis present

## 2020-11-09 DIAGNOSIS — R262 Difficulty in walking, not elsewhere classified: Secondary | ICD-10-CM | POA: Diagnosis not present

## 2020-11-09 DIAGNOSIS — G629 Polyneuropathy, unspecified: Secondary | ICD-10-CM | POA: Diagnosis present

## 2020-11-09 DIAGNOSIS — Z8249 Family history of ischemic heart disease and other diseases of the circulatory system: Secondary | ICD-10-CM | POA: Diagnosis not present

## 2020-11-09 DIAGNOSIS — Z7982 Long term (current) use of aspirin: Secondary | ICD-10-CM | POA: Diagnosis not present

## 2020-11-09 DIAGNOSIS — I739 Peripheral vascular disease, unspecified: Secondary | ICD-10-CM | POA: Diagnosis present

## 2020-11-09 DIAGNOSIS — G8929 Other chronic pain: Secondary | ICD-10-CM | POA: Diagnosis present

## 2020-11-09 DIAGNOSIS — Z79899 Other long term (current) drug therapy: Secondary | ICD-10-CM | POA: Diagnosis not present

## 2020-11-09 DIAGNOSIS — G992 Myelopathy in diseases classified elsewhere: Secondary | ICD-10-CM | POA: Diagnosis not present

## 2020-11-09 DIAGNOSIS — K219 Gastro-esophageal reflux disease without esophagitis: Secondary | ICD-10-CM | POA: Diagnosis present

## 2020-11-09 DIAGNOSIS — G63 Polyneuropathy in diseases classified elsewhere: Secondary | ICD-10-CM | POA: Diagnosis not present

## 2020-11-09 DIAGNOSIS — I2510 Atherosclerotic heart disease of native coronary artery without angina pectoris: Secondary | ICD-10-CM | POA: Diagnosis present

## 2020-11-09 DIAGNOSIS — R278 Other lack of coordination: Secondary | ICD-10-CM | POA: Diagnosis present

## 2020-11-09 DIAGNOSIS — E861 Hypovolemia: Secondary | ICD-10-CM | POA: Diagnosis present

## 2020-11-09 DIAGNOSIS — R202 Paresthesia of skin: Secondary | ICD-10-CM | POA: Diagnosis not present

## 2020-11-09 DIAGNOSIS — G379 Demyelinating disease of central nervous system, unspecified: Secondary | ICD-10-CM | POA: Diagnosis not present

## 2020-11-09 DIAGNOSIS — Z20822 Contact with and (suspected) exposure to covid-19: Secondary | ICD-10-CM | POA: Diagnosis present

## 2020-11-09 DIAGNOSIS — R200 Anesthesia of skin: Secondary | ICD-10-CM | POA: Diagnosis not present

## 2020-11-09 DIAGNOSIS — D529 Folate deficiency anemia, unspecified: Secondary | ICD-10-CM | POA: Diagnosis present

## 2020-11-09 DIAGNOSIS — Z951 Presence of aortocoronary bypass graft: Secondary | ICD-10-CM | POA: Diagnosis not present

## 2020-11-09 DIAGNOSIS — Z87891 Personal history of nicotine dependence: Secondary | ICD-10-CM | POA: Diagnosis not present

## 2020-11-09 DIAGNOSIS — R531 Weakness: Secondary | ICD-10-CM | POA: Diagnosis present

## 2020-11-09 DIAGNOSIS — I252 Old myocardial infarction: Secondary | ICD-10-CM | POA: Diagnosis not present

## 2020-11-09 DIAGNOSIS — Z8673 Personal history of transient ischemic attack (TIA), and cerebral infarction without residual deficits: Secondary | ICD-10-CM | POA: Diagnosis not present

## 2020-11-09 DIAGNOSIS — E871 Hypo-osmolality and hyponatremia: Secondary | ICD-10-CM | POA: Diagnosis present

## 2020-11-09 DIAGNOSIS — E785 Hyperlipidemia, unspecified: Secondary | ICD-10-CM | POA: Diagnosis present

## 2020-11-09 DIAGNOSIS — H3530 Unspecified macular degeneration: Secondary | ICD-10-CM | POA: Diagnosis present

## 2020-11-09 DIAGNOSIS — D509 Iron deficiency anemia, unspecified: Secondary | ICD-10-CM | POA: Diagnosis present

## 2020-11-09 DIAGNOSIS — I10 Essential (primary) hypertension: Secondary | ICD-10-CM | POA: Diagnosis present

## 2020-11-09 DIAGNOSIS — Z86718 Personal history of other venous thrombosis and embolism: Secondary | ICD-10-CM | POA: Diagnosis not present

## 2020-11-09 LAB — CBC
HCT: 25.5 % — ABNORMAL LOW (ref 36.0–46.0)
Hemoglobin: 7.9 g/dL — ABNORMAL LOW (ref 12.0–15.0)
MCH: 29.5 pg (ref 26.0–34.0)
MCHC: 31 g/dL (ref 30.0–36.0)
MCV: 95.1 fL (ref 80.0–100.0)
Platelets: 239 10*3/uL (ref 150–400)
RBC: 2.68 MIL/uL — ABNORMAL LOW (ref 3.87–5.11)
RDW: 18.8 % — ABNORMAL HIGH (ref 11.5–15.5)
WBC: 3.1 10*3/uL — ABNORMAL LOW (ref 4.0–10.5)
nRBC: 0 % (ref 0.0–0.2)

## 2020-11-09 LAB — URINE CULTURE

## 2020-11-09 LAB — BASIC METABOLIC PANEL
Anion gap: 5 (ref 5–15)
BUN: 7 mg/dL — ABNORMAL LOW (ref 8–23)
CO2: 26 mmol/L (ref 22–32)
Calcium: 9.1 mg/dL (ref 8.9–10.3)
Chloride: 106 mmol/L (ref 98–111)
Creatinine, Ser: 0.44 mg/dL (ref 0.44–1.00)
GFR, Estimated: >60 mL/min (ref >60)
Glucose, Bld: 81 mg/dL (ref 70–99)
Potassium: 3.7 mmol/L (ref 3.5–5.1)
Sodium: 137 mmol/L (ref 135–145)

## 2020-11-09 LAB — VITAMIN B12: Vitamin B-12: 951 pg/mL — ABNORMAL HIGH (ref 180–914)

## 2020-11-09 LAB — RPR: RPR Ser Ql: NONREACTIVE

## 2020-11-09 LAB — GLUCOSE, CAPILLARY
Glucose-Capillary: 84 mg/dL (ref 70–99)
Glucose-Capillary: 125 mg/dL — ABNORMAL HIGH (ref 70–99)

## 2020-11-09 LAB — HIV ANTIBODY (ROUTINE TESTING W REFLEX): HIV Screen 4th Generation wRfx: NONREACTIVE

## 2020-11-09 LAB — PHOSPHORUS: Phosphorus: 3.1 mg/dL (ref 2.5–4.6)

## 2020-11-09 LAB — MAGNESIUM: Magnesium: 1.9 mg/dL (ref 1.7–2.4)

## 2020-11-09 MED ORDER — DEXTROSE 5 % IV SOLN
1.0000 mg | Freq: Every day | INTRAVENOUS | Status: DC
  Filled 2020-11-09 (×2): qty 2.5

## 2020-11-09 MED ORDER — BISACODYL 5 MG PO TBEC
10.0000 mg | DELAYED_RELEASE_TABLET | Freq: Every day | ORAL | Status: DC | PRN
  Administered 2020-11-09: 10 mg via ORAL

## 2020-11-09 MED ORDER — DEXTROSE 5 % IV SOLN
2.0000 mg | Freq: Every day | INTRAVENOUS | Status: AC
  Administered 2020-11-09 – 2020-11-13 (×5): 2 mg via INTRAVENOUS
  Filled 2020-11-09 (×6): qty 5

## 2020-11-09 MED ORDER — POLYETHYLENE GLYCOL 3350 17 G PO PACK
17.0000 g | PACK | Freq: Every day | ORAL | Status: DC
  Administered 2020-11-09 – 2020-11-16 (×5): 17 g via ORAL
  Filled 2020-11-09 (×7): qty 1

## 2020-11-09 MED ORDER — BISACODYL 10 MG RE SUPP: 10.0000 mg | Freq: Every day | RECTAL | Status: DC | PRN

## 2020-11-09 MED ORDER — CYANOCOBALAMIN 1000 MCG/ML IJ SOLN
1000.0000 ug | Freq: Every day | INTRAMUSCULAR | Status: DC
  Administered 2020-11-09 – 2020-11-18 (×10): 1000 ug via INTRAMUSCULAR
  Filled 2020-11-09 (×10): qty 1

## 2020-11-09 MED ORDER — FOLIC ACID 1 MG PO TABS
5.0000 mg | ORAL_TABLET | Freq: Every day | ORAL | Status: DC
  Administered 2020-11-09 – 2020-11-18 (×10): 5 mg via ORAL
  Filled 2020-11-09 (×9): qty 5

## 2020-11-09 NOTE — Progress Notes (Signed)
[facility] Progress Note  Patient: [name]    [id number]  DOA: 11/07/2020     Date of Service: the patient was seen and examined on 11/09/2020  Chief Complaint  Patient presents with   Weakness   Brief hospital course: [name] is a 75 y.o. female with a past medical history of coronary artery disease, history of DVT after childbirth, hypertension, hyperlipidemia, chronic low back pain, ambulatory dysfunction.  The patient presents to the emergency department due to numbness and tingling of bilateral lower extremities and upper extremities.  She has also been unable to ambulate at all for the past few days.   Assessment and Plan:  Ataxia and inability ambulate: Could be secondary to copper deficiency!! CT head and C-spine: No acute intracranial abnormality. No acute displaced fracture or traumatic listhesis of the cervical spine. Multilevel degenerative changes of the spine with multilevel severe osseous neural foraminal stenosis MRI brain and L- spine: No evidence of acute intracranial abnormality. T2 hyperintense signal abnormality and abnormal restricted diffusion within the dorsal columns of the partially imaged upper cervical spinal cord. A dedicated cervical spine MRI (to include diffusion weighted imaging) is recommended for further evaluation. Mild-to-moderate chronic small vessel ischemic changes. Chronic lacunar infarct within the right thalamus. Multiple small chronic infarcts within the bilateral cerebellar hemispheres. Few scattered supratentorial and infratentorial chronic microhemorrhages, nonspecific but likely reflecting sequela of hypertensive microangiopathy.  MRI C and T spine: T2/STIR hyperintense signal abnormality within the dorsal columns throughout much of the cervical and thoracic spinal cord. Corresponding restricted diffusion within the dorsal columns, at least at the upper cervical levels. Findings are nonspecific and potential differential  considerations include B12 deficiency/subacute combined degeneration, copper deficiency, or syphilis. Cord infarction and demyelinating disease are considered less likely. On EMR reviwe found that on 09/20/19 serum Copper 17 low and 09/14/19  Ceruloplasmin level 3.4 low -Neurology consulted, rec copper 2 mg IV daily for 5 days and vitamin B12 1000 mcg IM, continue oral supplement on discharge,  Folate deficiency, started folic acid 5 mg daily, high-dose as per neurology. -PT/OT  eval rec SNF placement     Chronic back pain Continued oxycodone, Flexeril as needed and gabapentin home dose   Iron deficiency, iron saturation 5%, started Venofer 200 mg IV daily during hospital stay, start oral supplement on discharge.  Hypovolemic hyponatremia: Likely due to dehydration.  Start maintenance IV fluids with normal saline at 75 cc an hour. Recheck BMP with AM labs. Na 135 improved   Acute on chronic anemia: Hemoccult was negative.  Obtain iron studies with morning labs.   Other chronic conditions: Coronary artery disease, hypertension, hyperlipidemia: Resumed home medications    Body mass index is 21.53 kg/m.  Nutrition Problem: Inadequate oral intake Etiology: poor appetite Interventions: Interventions: Refer to RD note for recommendations  Pressure Injury (Active)     Location:   Location Orientation:   Staging:   Wound Description (Comments):   Present on Admission:      Diet: Regular DVT Prophylaxis: Subcutaneous Lovenox   Advance goals of care discussion: Full code  Family Communication: family was NOT present at bedside, at the time of interview.  The pt provided permission to discuss medical plan with the family. Opportunity was given to ask question and all questions were answered satisfactorily.   Disposition:  Pt is from home, admitted with difficulty ambulation, bilateral feet neuropathy, still has weakness, PT and OT eval done, recommend SNF placement, which precludes a  safe discharge.  Discharge to SNF, when cleared by neurology.  Subjective: No overnight issues, patient still has same feelings, neuropathy in the bilateral feet and bilateral hands, does not feel worsening of neurological symptoms.  Denies any other motor weakness.  No chest pain or palpitations, no shortness of breath.   Physical Exam: General:  alert oriented to time, place, and person.  Appear in no distress, affect appropriate Eyes: PERRLA ENT: Oral Mucosa Clear, moist  Neck: no JVD,  Cardiovascular: S1 and S2 Present, no Murmur,  Respiratory: good respiratory effort, Bilateral Air entry equal and Decreased, no Crackles, no wheezes Abdomen: Bowel Sound present, Soft and no tenderness,  Skin: no rashes Extremities: no Pedal edema, no calf tenderness Neurologic: without any new focal findings, numbness bilateral hands and numbness of bilateral feet, left foot >  right foot Gait not checked due to patient safety concerns  Vitals:   11/08/20 1611 11/08/20 1941 11/09/20 0547 11/09/20 0742  BP: (!) 101/51 (!) 115/58 (!) 113/53 (!) 108/52  Pulse: 82 70 71 69  Resp: '19 17 17 16  '$ Temp: 98.1 F (36.7 C) 98.5 F (36.9 C) 97.8 F (36.6 C) 97.9 F (36.6 C)  TempSrc:      SpO2: 96% 97% 97% 98%  Weight:      Height:        Intake/Output Summary (Last 24 hours) at 11/09/2020 1349 Last data filed at 11/09/2020 1152 Gross per 24 hour  Intake 2055.94 ml  Output 450 ml  Net 1605.94 ml   Filed Weights   11/07/20 1518 11/07/20 1600  Weight: 54.4 kg 50 kg    Data Reviewed: I have personally reviewed and interpreted daily labs, tele strips, imagings as discussed above. I reviewed all nursing notes, pharmacy notes, vitals, pertinent old records I have discussed plan of care as described above with RN and patient/family.  CBC: Recent Labs  Lab 11/07/20 1522 11/08/20 0448 11/09/20 0603  WBC 5.8 4.6 3.1*  HGB 8.6* 7.6* 7.9*  HCT 27.6* 24.8* 25.5*  MCV 95.5 95.8 95.1  PLT 266 250  [id number]   Basic Metabolic Panel: Recent Labs  Lab 11/07/20 1522 11/08/20 0448 11/08/20 0834 11/09/20 0603  NA 133* 135  --  137  K 4.0 4.2  --  3.7  CL 100 104  --  106  CO2 20* 23  --  26  GLUCOSE 88 69*  --  81  BUN 17 10  --  7*  CREATININE 0.62 0.53  --  0.44  CALCIUM 9.5 8.9  --  9.1  MG  --   --  1.9 1.9  PHOS  --   --  2.7 3.1    Studies: MR CERVICAL SPINE W WO CONTRAST  Result Date: 11/08/2020 CLINICAL DATA:  Myelopathy, acute or progressive; CSF leak suspected/ Spontaneous intracranial hypotension EXAM: MRI CERVICAL AND THORACIC SPINE WITHOUT AND WITH CONTRAST TECHNIQUE: Multiplanar and multiecho pulse sequences of the cervical spine, to include the craniocervical junction and cervicothoracic junction, and the thoracic spine, were obtained without and with intravenous contrast. CONTRAST:  12m GADAVIST GADOBUTROL 1 MMOL/ML IV SOLN COMPARISON:  CT of the cervical spine 11/07/2020. brain MRI performed earlier today 11/08/2020. Lumbar spine MRI performed earlier today 11/08/2020. FINDINGS: Mild intermittent motion degradation. MRI CERVICAL SPINE FINDINGS Alignment: Straightening of the expected cervical lordosis. Trace C2-C3, C3-C4, C4-C5, C5-C6 and C6-C7 grade 1 retrolisthesis. Vertebrae: Vertebral body height is maintained. Multilevel degenerative endplate irregularity and mixed degenerative endplate marrow signal. This includes mild degenerative endplate edema  and enhancement at C2-C3, C3-C4, C4-C5, C5-C6 and C6-C7. Cord: There is T2/STIR hyperintense signal abnormality within the dorsal columns throughout much of the cervical spinal cord. There is corresponding restricted diffusion within the dorsal columns, at least at the upper cervical levels. No abnormal cord enhancement is identified. Multilevel spinal cord impingement, as described below. There is limited assessment for spinal cord signal abnormality at the C3-C4, C4-C5 and C5-C6 levels due to the degree of cord compression.  Posterior Fossa, vertebral arteries, paraspinal tissues: Posterior fossa better assessed on brain MRI performed earlier today. Flow voids preserved within the imaged cervical vertebral arteries. Paraspinal soft tissues within normal limits. Disc levels: Multilevel disc degeneration. Most notably, there is moderate/advanced disc degeneration at C2-C3, C3-C4, C4-C5, C5-C6 and C6-C7. C2-C3: Trace grade 1 retrolisthesis. Disc bulge with bilateral disc osteophyte ridge/uncinate hypertrophy. Ligamentum flavum hypertrophy. Severe spinal canal stenosis with mild spinal cord flattening. No significant foraminal stenosis. C3-C4: Trace grade 1 retrolisthesis. Posterior disc osteophyte complex with bilateral disc osteophyte ridge/uncinate hypertrophy. Facet and ligamentum flavum hypertrophy. Severe spinal canal stenosis with significant spinal cord impingement. Bilateral neural foraminal narrowing (mild right, moderate left). C4-C5: Trace grade 1 retrolisthesis. Posterior disc osteophyte complex with bilateral disc osteophyte ridge/uncinate hypertrophy. Facet and ligamentum flavum hypertrophy. Severe spinal canal stenosis with significant spinal cord impingement. Moderate/severe bilateral neural foraminal narrowing (greater on the left). C5-C6: Trace grade 1 retrolisthesis. Posterior disc osteophyte complex with bilateral disc osteophyte ridge/uncinate hypertrophy. Facet arthrosis and ligamentum flavum hypertrophy. Severe spinal canal stenosis with significant spinal cord impingement. Bilateral neural foraminal narrowing (severe right, moderate/severe left). C6-C7: Posterior disc osteophyte complex with bilateral disc osteophyte ridge/uncinate hypertrophy. Minimal facet arthrosis and ligamentum flavum hypertrophy. Mild relative spinal canal narrowing without spinal cord mass effect. Moderate right neural foraminal narrowing. C7-T1: Shallow disc bulge. No significant spinal canal or foraminal stenosis. MRI THORACIC SPINE  FINDINGS Alignment: Thoracic dextrocurvature. Exaggerated thoracic kyphosis. Trace T2-T3 grade 1 anterolisthesis. Vertebrae: Mild chronic T12 superior endplate compression deformity. Vertebral body height is otherwise maintained. Multilevel degenerative endplate irregularity and mixed degenerative endplate marrow signal. This includes mild degenerative endplate edema and enhancement at T7-T8. Fusion across the disc spaces anteriorly at T5-T6 and T6-T7. Cord: There is T2/STIR hyperintense signal abnormality within the dorsal columns throughout much of the thoracic spinal cord. No abnormal cord enhancement is identified. Paraspinal and other soft tissues: No abnormality identified within included portions of the thorax or upper abdomen/retroperitoneum. Paraspinal soft tissues unremarkable. Disc levels: Multilevel disc degeneration. Most notably, there is moderate/advanced disc degeneration at T1-T2, T4-T5, T5-T6 and T6-T7. T1-T2: Disc bulge with endplate spurring. Minimal partial effacement of the ventral thecal sac without spinal cord mass effect. Mild right neural foraminal narrowing. T2-T3: Trace grade 1 anterolisthesis. Disc uncovering with shallow disc bulge. Minimal partial effacement of the ventral thecal sac without spinal cord mass effect. No significant foraminal stenosis. T3-T4: Shallow disc bulge. No significant spinal canal stenosis or neural foraminal narrowing. T4-T5: Shallow disc bulge. Mild partial effacement of ventral thecal sac without significant spinal cord mass effect. No significant foraminal stenosis. T5-T6: Fusion across the ventral aspect of the disc space. Disc bulge with endplate spurring. Partial effacement of the ventral thecal sac with possible contact upon the ventral spinal cord. No significant foraminal stenosis. T6-T7: Disc bulge with endplate spurring. The disc bulge contacts and flattens the ventral spinal cord. No significant foraminal narrowing. T7-T8: Shallow disc bulge with  endplate spurring. No significant spinal canal or foraminal stenosis. T8-T9: Shallow disc bulge. No significant  spinal canal or foraminal stenosis. T9-T10: No significant disc herniation or stenosis. T10-T11: No significant disc herniation or stenosis. T11-T12: Small disc bulge. Minimal partial effacement of the ventral thecal sac without spinal cord mass effect. No significant foraminal stenosis. These results were called by telephone at the time of interpretation on 11/08/2020 at 4:40 pm to provider MCNEILL [facility] , who verbally acknowledged these results. IMPRESSION: Cervical spine: 1. T2/STIR hyperintense signal abnormality within the dorsal columns throughout much of the cervical spinal cord. Corresponding restricted diffusion within the dorsal columns, at least at the upper cervical levels. Findings are nonspecific and potential differential considerations include B12 deficiency/subacute combined degeneration, copper deficiency, or syphilis. Cord infarction and demyelinating disease are considered less likely. 2. Cervical spondylosis, as outlined and with findings most notably as follows. 3. At C2-C3, there is multifactorial severe spinal canal stenosis with mild spinal cord flattening. 4. At C3-C4, there is multifactorial severe spinal canal stenosis with significant spinal cord impingement. Bilateral neural foraminal narrowing (mild right, moderate left) 5. At C4-C5, there is multifactorial severe spinal canal stenosis with significant spinal cord impingement. Moderate/severe bilateral neural foraminal narrowing. 6. At C5-C6, there is multifactorial severe spinal canal stenosis with significant spinal cord impingement. Bilateral neural foraminal narrowing (severe right, moderate/severe left). MRI thoracic spine: 1. T2/STIR hyperintense signal abnormality within the dorsal columns throughout much of the thoracic spinal cord. Findings are nonspecific and potential differential considerations include B12  deficiency/subacute combined degeneration, copper deficiency, or syphilis. Cord infarction and demyelinating disease are considered less likely. 2. Thoracic spondylosis, as outlined. 3. At T6-T7, a disc bulge and associated endplate spurring contact and mildly flatten the ventral spinal cord. 4. No more than mild relative spinal canal narrowing at the remaining levels. No compressive foraminal stenosis. 5. Fusion across the disc spaces anteriorly at T5-T6 and T6-T7 Electronically Signed   By: Kellie Simmering DO   On: 11/08/2020 16:41   MR THORACIC SPINE W WO CONTRAST  Result Date: 11/08/2020 CLINICAL DATA:  Myelopathy, acute or progressive; CSF leak suspected/ Spontaneous intracranial hypotension EXAM: MRI CERVICAL AND THORACIC SPINE WITHOUT AND WITH CONTRAST TECHNIQUE: Multiplanar and multiecho pulse sequences of the cervical spine, to include the craniocervical junction and cervicothoracic junction, and the thoracic spine, were obtained without and with intravenous contrast. CONTRAST:  36m GADAVIST GADOBUTROL 1 MMOL/ML IV SOLN COMPARISON:  CT of the cervical spine 11/07/2020. brain MRI performed earlier today 11/08/2020. Lumbar spine MRI performed earlier today 11/08/2020. FINDINGS: Mild intermittent motion degradation. MRI CERVICAL SPINE FINDINGS Alignment: Straightening of the expected cervical lordosis. Trace C2-C3, C3-C4, C4-C5, C5-C6 and C6-C7 grade 1 retrolisthesis. Vertebrae: Vertebral body height is maintained. Multilevel degenerative endplate irregularity and mixed degenerative endplate marrow signal. This includes mild degenerative endplate edema and enhancement at C2-C3, C3-C4, C4-C5, C5-C6 and C6-C7. Cord: There is T2/STIR hyperintense signal abnormality within the dorsal columns throughout much of the cervical spinal cord. There is corresponding restricted diffusion within the dorsal columns, at least at the upper cervical levels. No abnormal cord enhancement is identified. Multilevel spinal cord  impingement, as described below. There is limited assessment for spinal cord signal abnormality at the C3-C4, C4-C5 and C5-C6 levels due to the degree of cord compression. Posterior Fossa, vertebral arteries, paraspinal tissues: Posterior fossa better assessed on brain MRI performed earlier today. Flow voids preserved within the imaged cervical vertebral arteries. Paraspinal soft tissues within normal limits. Disc levels: Multilevel disc degeneration. Most notably, there is moderate/advanced disc degeneration at C2-C3, C3-C4, C4-C5, C5-C6 and C6-C7. C2-C3:  Trace grade 1 retrolisthesis. Disc bulge with bilateral disc osteophyte ridge/uncinate hypertrophy. Ligamentum flavum hypertrophy. Severe spinal canal stenosis with mild spinal cord flattening. No significant foraminal stenosis. C3-C4: Trace grade 1 retrolisthesis. Posterior disc osteophyte complex with bilateral disc osteophyte ridge/uncinate hypertrophy. Facet and ligamentum flavum hypertrophy. Severe spinal canal stenosis with significant spinal cord impingement. Bilateral neural foraminal narrowing (mild right, moderate left). C4-C5: Trace grade 1 retrolisthesis. Posterior disc osteophyte complex with bilateral disc osteophyte ridge/uncinate hypertrophy. Facet and ligamentum flavum hypertrophy. Severe spinal canal stenosis with significant spinal cord impingement. Moderate/severe bilateral neural foraminal narrowing (greater on the left). C5-C6: Trace grade 1 retrolisthesis. Posterior disc osteophyte complex with bilateral disc osteophyte ridge/uncinate hypertrophy. Facet arthrosis and ligamentum flavum hypertrophy. Severe spinal canal stenosis with significant spinal cord impingement. Bilateral neural foraminal narrowing (severe right, moderate/severe left). C6-C7: Posterior disc osteophyte complex with bilateral disc osteophyte ridge/uncinate hypertrophy. Minimal facet arthrosis and ligamentum flavum hypertrophy. Mild relative spinal canal narrowing without  spinal cord mass effect. Moderate right neural foraminal narrowing. C7-T1: Shallow disc bulge. No significant spinal canal or foraminal stenosis. MRI THORACIC SPINE FINDINGS Alignment: Thoracic dextrocurvature. Exaggerated thoracic kyphosis. Trace T2-T3 grade 1 anterolisthesis. Vertebrae: Mild chronic T12 superior endplate compression deformity. Vertebral body height is otherwise maintained. Multilevel degenerative endplate irregularity and mixed degenerative endplate marrow signal. This includes mild degenerative endplate edema and enhancement at T7-T8. Fusion across the disc spaces anteriorly at T5-T6 and T6-T7. Cord: There is T2/STIR hyperintense signal abnormality within the dorsal columns throughout much of the thoracic spinal cord. No abnormal cord enhancement is identified. Paraspinal and other soft tissues: No abnormality identified within included portions of the thorax or upper abdomen/retroperitoneum. Paraspinal soft tissues unremarkable. Disc levels: Multilevel disc degeneration. Most notably, there is moderate/advanced disc degeneration at T1-T2, T4-T5, T5-T6 and T6-T7. T1-T2: Disc bulge with endplate spurring. Minimal partial effacement of the ventral thecal sac without spinal cord mass effect. Mild right neural foraminal narrowing. T2-T3: Trace grade 1 anterolisthesis. Disc uncovering with shallow disc bulge. Minimal partial effacement of the ventral thecal sac without spinal cord mass effect. No significant foraminal stenosis. T3-T4: Shallow disc bulge. No significant spinal canal stenosis or neural foraminal narrowing. T4-T5: Shallow disc bulge. Mild partial effacement of ventral thecal sac without significant spinal cord mass effect. No significant foraminal stenosis. T5-T6: Fusion across the ventral aspect of the disc space. Disc bulge with endplate spurring. Partial effacement of the ventral thecal sac with possible contact upon the ventral spinal cord. No significant foraminal stenosis. T6-T7:  Disc bulge with endplate spurring. The disc bulge contacts and flattens the ventral spinal cord. No significant foraminal narrowing. T7-T8: Shallow disc bulge with endplate spurring. No significant spinal canal or foraminal stenosis. T8-T9: Shallow disc bulge. No significant spinal canal or foraminal stenosis. T9-T10: No significant disc herniation or stenosis. T10-T11: No significant disc herniation or stenosis. T11-T12: Small disc bulge. Minimal partial effacement of the ventral thecal sac without spinal cord mass effect. No significant foraminal stenosis. These results were called by telephone at the time of interpretation on 11/08/2020 at 4:40 pm to provider MCNEILL [facility] , who verbally acknowledged these results. IMPRESSION: Cervical spine: 1. T2/STIR hyperintense signal abnormality within the dorsal columns throughout much of the cervical spinal cord. Corresponding restricted diffusion within the dorsal columns, at least at the upper cervical levels. Findings are nonspecific and potential differential considerations include B12 deficiency/subacute combined degeneration, copper deficiency, or syphilis. Cord infarction and demyelinating disease are considered less likely. 2. Cervical spondylosis, as outlined and with  findings most notably as follows. 3. At C2-C3, there is multifactorial severe spinal canal stenosis with mild spinal cord flattening. 4. At C3-C4, there is multifactorial severe spinal canal stenosis with significant spinal cord impingement. Bilateral neural foraminal narrowing (mild right, moderate left) 5. At C4-C5, there is multifactorial severe spinal canal stenosis with significant spinal cord impingement. Moderate/severe bilateral neural foraminal narrowing. 6. At C5-C6, there is multifactorial severe spinal canal stenosis with significant spinal cord impingement. Bilateral neural foraminal narrowing (severe right, moderate/severe left). MRI thoracic spine: 1. T2/STIR hyperintense signal  abnormality within the dorsal columns throughout much of the thoracic spinal cord. Findings are nonspecific and potential differential considerations include B12 deficiency/subacute combined degeneration, copper deficiency, or syphilis. Cord infarction and demyelinating disease are considered less likely. 2. Thoracic spondylosis, as outlined. 3. At T6-T7, a disc bulge and associated endplate spurring contact and mildly flatten the ventral spinal cord. 4. No more than mild relative spinal canal narrowing at the remaining levels. No compressive foraminal stenosis. 5. Fusion across the disc spaces anteriorly at T5-T6 and T6-T7 Electronically Signed   By: Kellie Simmering DO   On: 11/08/2020 16:41    Scheduled Meds:  acetaminophen  650 mg Oral Q6H   aspirin EC  81 mg Oral Daily   calcium carbonate  1 tablet Oral BID WC   cholecalciferol  1,000 Units Oral Daily   cyanocobalamin  1,000 mcg Intramuscular Daily   enoxaparin (LOVENOX) injection  40 mg Subcutaneous Q24H   ezetimibe  10 mg Oral Daily   feeding supplement  237 mL Oral BID BM   folic acid  5 mg Oral Daily   gabapentin  100 mg Oral TID   multivitamin with minerals  1 tablet Oral Daily   pantoprazole  40 mg Oral Daily   rosuvastatin  10 mg Oral Daily   sertraline  100 mg Oral Daily   Continuous Infusions:  sodium chloride Stopped (11/09/20 1146)   copper chloride IVPB 2 mg (11/09/20 1146)   iron sucrose Stopped (11/09/20 1144)   PRN Meds: cyclobenzaprine, doxylamine (Sleep), oxyCODONE  Time spent: 35 minutes  Author: Val Riles. MD [facility] 11/09/2020 1:49 PM  To reach On-call, see care teams to locate the attending and reach out to them via [email or web address]. If 7PM-7AM, please contact night-coverage If you still have difficulty reaching the attending provider, please page the [facility] (Director on Call) for [facility] on amion for assistance.

## 2020-11-09 NOTE — Progress Notes (Signed)
Subjective: No significant changes  Exam: Vitals:   11/09/20 0547 11/09/20 0742  BP: (!) 113/53 (!) 108/52  Pulse: 71 69  Resp: 17 16  Temp: 97.8 F (36.6 C) 97.9 F (36.6 C)  SpO2: 97% 98%   Gen: In bed, NAD Resp: non-labored breathing, no acute distress Abd: soft, nt  Neuro: MS: Awake, alert, interactive and appropriate CN: Pupils equal round and reactive, extraocular movements intact Motor: 5/5 strength throughout Sensory: She has diminished two-point discrimination in her lower extremities with relatively preserved temperature sensation.  With eyes closed, she slightly misses her nose when trying to take her arm from an outstretched position to touch her nose.  She is significantly more ataxic in bilateral lower extremities than her upper extremities.   Pertinent Labs: B12 598 Folate 2.9  Impression: 75 year old female with clinical and radiographic signs of subacute combined degeneration.  Given her history of copper deficiency, and the fact that this is a fairly typical presentation of copper deficiency, I think that we have a reasonable explanation for her presentation.  Her current serum copper is pending, but if it confirms deficiency, then no further testing is needed.  I did send some other vitamin labs to check for other deficiencies.  One possible explanation for copper deficiency is that she is taking a vitamin with zinc which competes for absorption and can lead to copper deficiency.  There is copper in the supplement as well specifically to try to avoid zinc related copper deficiency, but this is the only risk factor I can find.   Recommendations: 1) copper supplementation, would do IV supplementation for at least 5 days 2) follow-up serum copper 3) neurology will continue to follow  Roland Rack, MD [facility] [phone]  If 7pm- 7am, please page neurology on call as listed in [place].

## 2020-11-10 DIAGNOSIS — R200 Anesthesia of skin: Secondary | ICD-10-CM

## 2020-11-10 DIAGNOSIS — R202 Paresthesia of skin: Secondary | ICD-10-CM

## 2020-11-10 DIAGNOSIS — E610 Copper deficiency: Principal | ICD-10-CM

## 2020-11-10 DIAGNOSIS — G992 Myelopathy in diseases classified elsewhere: Secondary | ICD-10-CM

## 2020-11-10 DIAGNOSIS — G379 Demyelinating disease of central nervous system, unspecified: Secondary | ICD-10-CM

## 2020-11-10 DIAGNOSIS — R262 Difficulty in walking, not elsewhere classified: Secondary | ICD-10-CM | POA: Diagnosis not present

## 2020-11-10 LAB — BASIC METABOLIC PANEL
Anion gap: 3 — ABNORMAL LOW (ref 5–15)
BUN: 10 mg/dL (ref 8–23)
CO2: 27 mmol/L (ref 22–32)
Calcium: 9.2 mg/dL (ref 8.9–10.3)
Chloride: 108 mmol/L (ref 98–111)
Creatinine, Ser: 0.47 mg/dL (ref 0.44–1.00)
GFR, Estimated: >60 mL/min (ref >60)
Glucose, Bld: 76 mg/dL (ref 70–99)
Potassium: 3.8 mmol/L (ref 3.5–5.1)
Sodium: 138 mmol/L (ref 135–145)

## 2020-11-10 LAB — GLUCOSE, CAPILLARY: Glucose-Capillary: 114 mg/dL — ABNORMAL HIGH (ref 70–99)

## 2020-11-10 LAB — CBC
HCT: 25.9 % — ABNORMAL LOW (ref 36.0–46.0)
Hemoglobin: 8.1 g/dL — ABNORMAL LOW (ref 12.0–15.0)
MCH: 29.5 pg (ref 26.0–34.0)
MCHC: 31.3 g/dL (ref 30.0–36.0)
MCV: 94.2 fL (ref 80.0–100.0)
Platelets: 248 10*3/uL (ref 150–400)
RBC: 2.75 MIL/uL — ABNORMAL LOW (ref 3.87–5.11)
RDW: 19.5 % — ABNORMAL HIGH (ref 11.5–15.5)
WBC: 3.6 10*3/uL — ABNORMAL LOW (ref 4.0–10.5)
nRBC: 0 % (ref 0.0–0.2)

## 2020-11-10 MED ORDER — GABAPENTIN 100 MG PO CAPS
200.0000 mg | ORAL_CAPSULE | Freq: Three times a day (TID) | ORAL | Status: DC
  Administered 2020-11-10 – 2020-11-18 (×24): 200 mg via ORAL
  Filled 2020-11-10 (×24): qty 2

## 2020-11-10 NOTE — Progress Notes (Addendum)
Subjective: No significant changes Complains of some paresthesia.   Exam: Vitals:   11/10/20 0500 11/10/20 0740  BP: (!) 104/55 (!) 105/54  Pulse: 70 77  Resp: 18 16  Temp: 98.2 F (36.8 C) 98.6 F (37 C)  SpO2: 98% 99%   Gen: In bed, NAD Resp: non-labored breathing, no acute distress Abd: soft, nt  Neuro: MS: Awake, alert, interactive and appropriate CN: Pupils equal round and reactive, extraocular movements intact Motor: 5/5 strength throughout Sensory: She has diminished two-point discrimination in her lower extremities with relatively preserved temperature sensation.  With eyes closed, she slightly misses her nose when trying to take her arm from an outstretched position to touch her nose.  She is significantly more ataxic in bilateral lower extremities than her upper extremities.   Pertinent Labs: B12 598 RPR nonreactive Folate 2.9 Serum copper pending.  Vit E pending MMA, homocysteine  Impression: 75 year old female with clinical and radiographic signs of subacute combined degeneration.  Given her history of copper deficiency, and the fact that this is a fairly typical presentation of copper deficiency, I think that we have a reasonable explanation for her presentation.  Her current serum copper is pending, but if it confirms deficiency, then no further testing is needed.  I did send some other vitamin labs to check for other deficiencies.  One possible explanation for copper deficiency is that she is taking a vitamin with zinc which competes for absorption and can lead to copper deficiency.  There is copper in the supplement as well specifically to try to avoid zinc related copper deficiency, but this is the only risk factor I can find.   Recommendations: 1) copper supplementation, would do IV supplementation for at least 5 days 2) follow-up serum copper 3) increase gabapentin to '200mg'$  TID 4) neurology will continue to follow  Roland Rack, MD [facility] [phone]  If 7pm- 7am, please page neurology on call as listed in [place].

## 2020-11-10 NOTE — Progress Notes (Signed)
[facility] Progress Note  Patient: [name]    [id number]  DOA: 11/07/2020     Date of Service: the patient was seen and examined on 11/10/2020  Chief Complaint  Patient presents with   Weakness   Brief hospital course: [name] is a 75 y.o. female with a past medical history of coronary artery disease, history of DVT after childbirth, hypertension, hyperlipidemia, chronic low back pain, ambulatory dysfunction.  The patient presents to the emergency department due to numbness and tingling of bilateral lower extremities and upper extremities.  She has also been unable to ambulate at all for the past few days.   Assessment and Plan:  Ataxia and inability ambulate: Could be secondary to copper deficiency!! CT head and C-spine: No acute intracranial abnormality. No acute displaced fracture or traumatic listhesis of the cervical spine. Multilevel degenerative changes of the spine with multilevel severe osseous neural foraminal stenosis MRI brain and L- spine: No evidence of acute intracranial abnormality. T2 hyperintense signal abnormality and abnormal restricted diffusion within the dorsal columns of the partially imaged upper cervical spinal cord. A dedicated cervical spine MRI (to include diffusion weighted imaging) is recommended for further evaluation. Mild-to-moderate chronic small vessel ischemic changes. Chronic lacunar infarct within the right thalamus. Multiple small chronic infarcts within the bilateral cerebellar hemispheres. Few scattered supratentorial and infratentorial chronic microhemorrhages, nonspecific but likely reflecting sequela of hypertensive microangiopathy.  MRI C and T spine: T2/STIR hyperintense signal abnormality within the dorsal columns throughout much of the cervical and thoracic spinal cord. Corresponding restricted diffusion within the dorsal columns, at least at the upper cervical levels. Findings are nonspecific and potential differential  considerations include B12 deficiency/subacute combined degeneration, copper deficiency, or syphilis. Cord infarction and demyelinating disease are considered less likely. On EMR reviwe found that on 09/20/19 serum Copper 17 low and 09/14/19  Ceruloplasmin level 3.4 low HIV and RPR NR -Neurology consulted, rec copper 2 mg IV daily for 5 days and vitamin B12 1000 mcg IM, continue oral supplement on discharge,  Folate deficiency, started folic acid 5 mg daily, high-dose as per neurology. -PT/OT  eval rec SNF placement  7/24 Increase gabapentin to 200 mg p.o. 3 times daily by neurologist   Chronic back pain Continued oxycodone, Flexeril as needed and gabapentin home dose   Iron deficiency, iron saturation 5%, started Venofer 200 mg IV daily during hospital stay, start oral supplement on discharge.  Hypovolemic hyponatremia: Likely due to dehydration.  Start maintenance IV fluids with normal saline at 75 cc an hour. Recheck BMP with AM labs. Na 138 improved   Acute on chronic anemia due to iron deficiency and folate deficiency, Started supplements,  Hemoccult was negative.  . Monitor H&H  Other chronic conditions: Coronary artery disease, hypertension, hyperlipidemia: Resumed home medications    Body mass index is 21.53 kg/m.  Nutrition Problem: Inadequate oral intake Etiology: poor appetite Interventions: Interventions: Refer to RD note for recommendations  Pressure Injury (Active)     Location:   Location Orientation:   Staging:   Wound Description (Comments):   Present on Admission:      Diet: Regular DVT Prophylaxis: Subcutaneous Lovenox   Advance goals of care discussion: Full code  Family Communication: family was NOT present at bedside, at the time of interview.  The pt provided permission to discuss medical plan with the family. Opportunity was given to ask question and all questions were answered satisfactorily.   Disposition:  Pt is from home, admitted with  difficulty ambulation, bilateral feet neuropathy, still has weakness, PT and OT eval done, recommend SNF placement, which precludes a safe discharge. Discharge to SNF, when cleared by neurology.  Subjective: No overnight issues, patient feels worsening of tingling sensation in the bilateral feet especially in the soles, touch sensation seems to be increased on the dorsum of feet.  Patient denied any other worsening symptoms.  Denied any chest pain or palpitations and no shortness of breath.      Physical Exam: General:  alert oriented to time, place, and person.  Appear in no distress, affect appropriate Eyes: PERRLA ENT: Oral Mucosa Clear, moist  Neck: no JVD,  Cardiovascular: S1 and S2 Present, no Murmur,  Respiratory: good respiratory effort, Bilateral Air entry equal and Decreased, no Crackles, no wheezes Abdomen: Bowel Sound present, Soft and no tenderness,  Skin: no rashes Extremities: no Pedal edema, no calf tenderness Neurologic: without any new focal findings, numbness bilateral hands and numbness of bilateral feet,  Gait not checked due to patient safety concerns  Vitals:   11/09/20 1626 11/09/20 2000 11/10/20 0500 11/10/20 0740  BP: (!) 101/51 128/66 (!) 104/55 (!) 105/54  Pulse: 88 78 70 77  Resp: '18 16 18 16  '$ Temp: 97.9 F (36.6 C) 97.9 F (36.6 C) 98.2 F (36.8 C) 98.6 F (37 C)  TempSrc:  Oral Oral   SpO2: 97% 100% 98% 99%  Weight:      Height:        Intake/Output Summary (Last 24 hours) at 11/10/2020 1412 Last data filed at 11/10/2020 1103 Gross per 24 hour  Intake 1448.46 ml  Output 600 ml  Net 848.46 ml   Filed Weights   11/07/20 1518 11/07/20 1600  Weight: 54.4 kg 50 kg    Data Reviewed: I have personally reviewed and interpreted daily labs, tele strips, imagings as discussed above. I reviewed all nursing notes, pharmacy notes, vitals, pertinent old records I have discussed plan of care as described above with RN and  patient/family.  CBC: Recent Labs  Lab 11/07/20 1522 11/08/20 0448 11/09/20 0603 11/10/20 0536  WBC 5.8 4.6 3.1* 3.6*  HGB 8.6* 7.6* 7.9* 8.1*  HCT 27.6* 24.8* 25.5* 25.9*  MCV 95.5 95.8 95.1 94.2  PLT 266 250 239 [id number]   Basic Metabolic Panel: Recent Labs  Lab 11/07/20 1522 11/08/20 0448 11/08/20 0834 11/09/20 0603 11/10/20 0536  NA 133* 135  --  137 138  K 4.0 4.2  --  3.7 3.8  CL 100 104  --  106 108  CO2 20* 23  --  26 27  GLUCOSE 88 69*  --  81 76  BUN 17 10  --  7* 10  CREATININE 0.62 0.53  --  0.44 0.47  CALCIUM 9.5 8.9  --  9.1 9.2  MG  --   --  1.9 1.9  --   PHOS  --   --  2.7 3.1  --     Studies: No results found.  Scheduled Meds:  aspirin EC  81 mg Oral Daily   calcium carbonate  1 tablet Oral BID WC   cholecalciferol  1,000 Units Oral Daily   cyanocobalamin  1,000 mcg Intramuscular Daily   enoxaparin (LOVENOX) injection  40 mg Subcutaneous Q24H   ezetimibe  10 mg Oral Daily   feeding supplement  237 mL Oral BID BM   folic acid  5 mg Oral Daily   gabapentin  200 mg Oral TID   multivitamin with minerals  1 tablet Oral Daily   pantoprazole  40 mg Oral Daily   polyethylene glycol  17 g Oral Daily   rosuvastatin  10 mg Oral Daily   sertraline  100 mg Oral Daily   Continuous Infusions:  sodium chloride Stopped (11/10/20 0924)   copper chloride IVPB 2 mg (11/10/20 0924)   iron sucrose Stopped (11/10/20 0924)   PRN Meds: bisacodyl, bisacodyl, cyclobenzaprine, doxylamine (Sleep), oxyCODONE  Time spent: 35 minutes  Author: Val Riles. MD [facility] 11/10/2020 2:12 PM  To reach On-call, see care teams to locate the attending and reach out to them via [email or web address]. If 7PM-7AM, please contact night-coverage If you still have difficulty reaching the attending provider, please page the [facility] (Director on Call) for [facility] on amion for assistance.

## 2020-11-10 NOTE — Plan of Care (Signed)

## 2020-11-11 DIAGNOSIS — R262 Difficulty in walking, not elsewhere classified: Secondary | ICD-10-CM | POA: Diagnosis not present

## 2020-11-11 LAB — CBC
HCT: 25.3 % — ABNORMAL LOW (ref 36.0–46.0)
Hemoglobin: 7.7 g/dL — ABNORMAL LOW (ref 12.0–15.0)
MCH: 28.4 pg (ref 26.0–34.0)
MCHC: 30.4 g/dL (ref 30.0–36.0)
MCV: 93.4 fL (ref 80.0–100.0)
Platelets: 256 10*3/uL (ref 150–400)
RBC: 2.71 MIL/uL — ABNORMAL LOW (ref 3.87–5.11)
RDW: 19.9 % — ABNORMAL HIGH (ref 11.5–15.5)
WBC: 5.6 10*3/uL (ref 4.0–10.5)
nRBC: 0.4 % — ABNORMAL HIGH (ref 0.0–0.2)

## 2020-11-11 LAB — BASIC METABOLIC PANEL
Anion gap: 5 (ref 5–15)
BUN: 12 mg/dL (ref 8–23)
CO2: 25 mmol/L (ref 22–32)
Calcium: 8.7 mg/dL — ABNORMAL LOW (ref 8.9–10.3)
Chloride: 107 mmol/L (ref 98–111)
Creatinine, Ser: 0.61 mg/dL (ref 0.44–1.00)
GFR, Estimated: >60 mL/min (ref >60)
Glucose, Bld: 80 mg/dL (ref 70–99)
Potassium: 4 mmol/L (ref 3.5–5.1)
Sodium: 137 mmol/L (ref 135–145)

## 2020-11-11 LAB — GLUCOSE, CAPILLARY
Glucose-Capillary: 84 mg/dL (ref 70–99)
Glucose-Capillary: 155 mg/dL — ABNORMAL HIGH (ref 70–99)

## 2020-11-11 LAB — ANA: Anti Nuclear Antibody (ANA): NEGATIVE

## 2020-11-11 LAB — RHEUMATOID FACTOR: Rheumatoid fact SerPl-aCnc: <10 IU/mL (ref <14.0)

## 2020-11-11 LAB — INTRINSIC FACTOR ANTIBODIES: Intrinsic Factor: 1 AU/mL (ref 0.0–1.1)

## 2020-11-11 LAB — HOMOCYSTEINE: Homocysteine: 12 umol/L (ref 0.0–19.2)

## 2020-11-11 LAB — COPPER, SERUM: Copper: 11 ug/dL — ABNORMAL LOW (ref 80–158)

## 2020-11-11 MED ORDER — OXYCODONE HCL 5 MG PO TABS
10.0000 mg | ORAL_TABLET | Freq: Four times a day (QID) | ORAL | Status: DC | PRN
  Administered 2020-11-11 – 2020-11-18 (×20): 10 mg via ORAL
  Filled 2020-11-11 (×20): qty 2

## 2020-11-11 NOTE — Progress Notes (Signed)
PROGRESS NOTE    [name]  [id number] DOB: [birth year] DOA: 11/07/2020 PCP: Burnard Hawthorne, FNP   Brief Narrative:  75 y.o. female with a past medical history of coronary artery disease, history of DVT after childbirth, hypertension, hyperlipidemia, chronic low back pain, ambulatory dysfunction.  The patient presents to the emergency department due to numbness and tingling of bilateral lower extremities and upper extremities.  She has also been unable to ambulate at all for the past few days. Started on IV copper infusion after neurology recommendations.  Feels that her ataxia is slowly starting to improve.  Copper infusions to run through 7/27.   Assessment & Plan:   Active Problems:   Ambulatory dysfunction   CNS demyelination (HCC)  Acute ataxia Decreased ability to ambulate Possibly secondary to copper deficiency Also possible contribution from [id number] folic acid deficiency CT imaging survey and MRI imaging survey negative Plan: Follow-up neurology recommendations for IV copper infusion Continue IM B12 Oral supplement on discharge Continue folic acid replacement Therapy as able Gabapentin 200 mg p.o. 3 times daily  Chronic back pain PTA oxycodone and Flexeril Gabapentin  Acute on chronic iron deficiency and folate deficiency anemia Venofer [id number] mg IV daily Folic acid replacement as above Will convert to p.o. at time of discharge  Hypovolemic hyponatremia Likely secondary to dehydration Sodium improved No further need for IV fluids     DVT prophylaxis: SQ Lovenox Code Status: Full Family Communication: None today Disposition Plan: Status is: Inpatient  Remains inpatient appropriate because:Inpatient level of care appropriate due to severity of illness  Dispo: The patient is from: Home              Anticipated d/c is to: Home              Patient currently is not medically stable to d/c.   Difficult to place patient No       Level of care:  Med-Surg  Consultants:  Neurology  Procedures:  None  Antimicrobials:  None   Subjective: Patient seen and examined.  Sitting in chair.  No visible distress.  Reports weakness is slowly improving.  Objective: Vitals:   11/10/20 1520 11/10/20 2058 11/11/20 0432 11/11/20 0800  BP: (!) 104/52 (!) 115/59 (!) 92/47 (!) 107/53  Pulse: 93 84 86 87  Resp: '16 16 20 17  '$ Temp: 98.2 F (36.8 C) 98.2 F (36.8 C) 98.4 F (36.9 C) 98.9 F (37.2 C)  TempSrc:   Oral   SpO2: 97% 98% 91% 92%  Weight:      Height:        Intake/Output Summary (Last 24 hours) at 11/11/2020 1448 Last data filed at 11/11/2020 1014 Gross per 24 hour  Intake 340 ml  Output 900 ml  Net -560 ml   Filed Weights   11/07/20 1518 11/07/20 1600  Weight: 54.4 kg 50 kg    Examination:  General exam: Appears calm and comfortable  Respiratory system: Clear to auscultation. Respiratory effort normal. Cardiovascular system: S1 & S2 heard, RRR. No JVD, murmurs, rubs, gallops or clicks. No pedal edema. Gastrointestinal system: Abdomen is nondistended, soft and nontender. No organomegaly or masses felt. Normal bowel sounds heard. Central nervous system: Alert and oriented.  Numbness bilateral hands and feet.  Gait not assessed Extremities: Symmetric 5 x 5 power. Skin: No rashes, lesions or ulcers Psychiatry: Judgement and insight appear normal. Mood & affect appropriate.     Data Reviewed: I have personally reviewed following labs and imaging studies  CBC: Recent Labs  Lab 11/07/20 1522 11/08/20 0448 11/09/20 0603 11/10/20 0536 11/11/20 0555  WBC 5.8 4.6 3.1* 3.6* 5.6  HGB 8.6* 7.6* 7.9* 8.1* 7.7*  HCT 27.6* 24.8* 25.5* 25.9* 25.3*  MCV 95.5 95.8 95.1 94.2 93.4  PLT 266 250 239 248 [id number]   Basic Metabolic Panel: Recent Labs  Lab 11/07/20 1522 11/08/20 0448 11/08/20 0834 11/09/20 0603 11/10/20 0536 11/11/20 0555  NA 133* 135  --  137 138 137  K 4.0 4.2  --  3.7 3.8 4.0  CL 100 104  --  106 108  107  CO2 20* 23  --  '26 27 25  '$ GLUCOSE 88 69*  --  81 76 80  BUN 17 10  --  7* 10 12  CREATININE 0.62 0.53  --  0.44 0.47 0.61  CALCIUM 9.5 8.9  --  9.1 9.2 8.7*  MG  --   --  1.9 1.9  --   --   PHOS  --   --  2.7 3.1  --   --    GFR: Estimated Creatinine Clearance: 45.7 mL/min (by C-G formula based on SCr of 0.61 mg/dL). Liver Function Tests: Recent Labs  Lab 11/07/20 1522  AST 30  ALT 37  ALKPHOS 78  BILITOT 1.1  PROT 6.3*  ALBUMIN 3.7   Recent Labs  Lab 11/07/20 1522  LIPASE 37   No results for input(s): AMMONIA in the last 168 hours. Coagulation Profile: No results for input(s): INR, PROTIME in the last 168 hours. Cardiac Enzymes: No results for input(s): CKTOTAL, CKMB, CKMBINDEX, TROPONINI in the last 168 hours. BNP (last 3 results) No results for input(s): PROBNP in the last 8760 hours. HbA1C: No results for input(s): HGBA1C in the last 72 hours. CBG: Recent Labs  Lab 11/08/20 2013 11/09/20 0738 11/09/20 2125 11/10/20 2156 11/11/20 0826  GLUCAP 117* 84 125* 114* 84   Lipid Profile: No results for input(s): CHOL, HDL, LDLCALC, TRIG, CHOLHDL, LDLDIRECT in the last 72 hours. Thyroid Function Tests: No results for input(s): TSH, T4TOTAL, FREET4, T3FREE, THYROIDAB in the last 72 hours. Anemia Panel: Recent Labs    11/09/20 0919  VITAMINB12 951*   Sepsis Labs: No results for input(s): PROCALCITON, LATICACIDVEN in the last 168 hours.  Recent Results (from the past 240 hour(s))  Resp Panel by RT-PCR (Flu A&B, Covid) Nasopharyngeal Swab     Status: None   Collection Time: 11/07/20  4:19 PM   Specimen: Nasopharyngeal Swab; Nasopharyngeal(NP) swabs in vial transport medium  Result Value Ref Range Status   SARS Coronavirus 2 by RT PCR NEGATIVE NEGATIVE Final    Comment: (NOTE) SARS-CoV-2 target nucleic acids are NOT DETECTED.  The SARS-CoV-2 RNA is generally detectable in upper respiratory specimens during the acute phase of infection. The  lowest concentration of SARS-CoV-2 viral copies this assay can detect is 138 copies/mL. A negative result does not preclude SARS-Cov-2 infection and should not be used as the sole basis for treatment or other patient management decisions. A negative result may occur with  improper specimen collection/handling, submission of specimen other than nasopharyngeal swab, presence of viral mutation(s) within the areas targeted by this assay, and inadequate number of viral copies(<138 copies/mL). A negative result must be combined with clinical observations, patient history, and epidemiological information. The expected result is Negative.  Fact Sheet for Patients:  EntrepreneurPulse.com.au  Fact Sheet for Healthcare Providers:  IncredibleEmployment.be  This test is no t yet approved or cleared by the [place]  FDA and  has been authorized for detection and/or diagnosis of SARS-CoV-2 by FDA under an Emergency Use Authorization (EUA). This EUA will remain  in effect (meaning this test can be used) for the duration of the COVID-19 declaration under Section 564(b)(1) of the Act, 21 U.S.C.section 360bbb-3(b)(1), unless the authorization is terminated  or revoked sooner.       Influenza A by PCR NEGATIVE NEGATIVE Final   Influenza B by PCR NEGATIVE NEGATIVE Final    Comment: (NOTE) The Xpert Xpress SARS-CoV-2/FLU/RSV plus assay is intended as an aid in the diagnosis of influenza from Nasopharyngeal swab specimens and should not be used as a sole basis for treatment. Nasal washings and aspirates are unacceptable for Xpert Xpress SARS-CoV-2/FLU/RSV testing.  Fact Sheet for Patients: EntrepreneurPulse.com.au  Fact Sheet for Healthcare Providers: IncredibleEmployment.be  This test is not yet approved or cleared by the [place] FDA and has been authorized for detection and/or diagnosis of SARS-CoV-2 by FDA under  an Emergency Use Authorization (EUA). This EUA will remain in effect (meaning this test can be used) for the duration of the COVID-19 declaration under Section 564(b)(1) of the Act, 21 U.S.C. section 360bbb-3(b)(1), unless the authorization is terminated or revoked.  Performed at [facility], [address], [place], [place] [address]   Urine Culture     Status: Abnormal   Collection Time: 11/07/20  4:19 PM   Specimen: Urine, Clean Catch  Result Value Ref Range Status   Specimen Description   Final    URINE, CLEAN CATCH Performed at [facility], [address], [place], [place] [address]    Special Requests   Final    NONE Performed at [facility], [address] [place], [place] [address]    Culture MULTIPLE SPECIES PRESENT, SUGGEST RECOLLECTION (A)  Final   Report Status 11/09/2020 FINAL  Final         Radiology Studies: No results found.      Scheduled Meds:  aspirin EC  81 mg Oral Daily   calcium carbonate  1 tablet Oral BID WC   cholecalciferol  1,000 Units Oral Daily   cyanocobalamin  1,000 mcg Intramuscular Daily   enoxaparin (LOVENOX) injection  40 mg Subcutaneous Q24H   ezetimibe  10 mg Oral Daily   feeding supplement  237 mL Oral BID BM   folic acid  5 mg Oral Daily   gabapentin  200 mg Oral TID   multivitamin with minerals  1 tablet Oral Daily   pantoprazole  40 mg Oral Daily   polyethylene glycol  17 g Oral Daily   rosuvastatin  10 mg Oral Daily   sertraline  100 mg Oral Daily   Continuous Infusions:  sodium chloride 1,000 mL (11/11/20 0959)   copper chloride IVPB 2 mg (11/11/20 1008)   iron sucrose 200 mg (11/11/20 0847)     LOS: 2 days    Time spent: 25 minutes    Sidney Ace, MD [facility] Pager [phone]  If 7PM-7AM, please contact night-coverage 11/11/2020, 2:48 PM

## 2020-11-11 NOTE — Progress Notes (Signed)
Physical Therapy Treatment Patient Details Name: [name] MRN: [id number] DOB: 30-Sep-1945 Today's Date: 11/11/2020    History of Present Illness Per MD notes, pt is a 75 y.o. female who presented to the emergency department due to numbness and tingling of bilateral lower extremities and upper extremities with a past medical history of coronary artery disease, history of DVT after childbirth, hypertension, hyperlipidemia, chronic low back pain, ambulatory dysfunction. MD assessment includes ataxia and inability to ambulate, hypovolemic hyponatremia, and acute on chronic anemia.    PT Comments    Pt was pleasant and motivated to participate during the session. Pt gave good effort throughout session. Pt reports that her strength feels about the same as last visit. Pt performed all functional mobility with supervision-min A requiring verbal cues for sequencing. Pt demonstrated one instance of LOB tripping on own feet secondary to ataxic gait while ambulating but able to correct with min A. Pt encouraged to use RW upon d/c instead of rollator to provide more stability during ambulation. Pt is limited with functional mobility secondary general weakness and decreased balance. Pt will benefit from PT services in a SNF setting upon discharge to safely address deficits listed in patient problem list for decreased caregiver assistance and eventual return to PLOF.   Follow Up Recommendations  SNF     Equipment Recommendations  Other (comment) (TBD)    Recommendations for Other Services       Precautions / Restrictions Precautions Precautions: Fall Restrictions Weight Bearing Restrictions: No    Mobility  Bed Mobility Overal bed mobility: Needs Assistance Bed Mobility: Sit to Supine     Supine to sit: Supervision;HOB elevated Sit to supine: Supervision   General bed mobility comments: Increased time but no physical assist    Transfers Overall transfer level: Needs  assistance Equipment used: Rolling walker (2 wheeled)   Sit to Stand: Min guard         General transfer comment: Min guard for increased safety. Verbal cues for sequencing and walker placement. After 1st sit>stand, pt reported feeling wobbly with knees starting to buckle requring her to sit down, but these symptoms did not occur on future attempts.  Ambulation/Gait Ambulation/Gait assistance: Min guard;Min assist Gait Distance (Feet): 5 Feet x2 Assistive device: Rolling walker (2 wheeled) Gait Pattern/deviations: Step-to pattern;Decreased stride length;Decreased step length - left;Decreased step length - right;Wide base of support;Ataxic Gait velocity: decreased   General Gait Details: Verbal cues for sequencing and walker placement. Pt demonstrated ataxic gait reporting difficulty moving feet in the direction she wanted. Pt tripped over own feet secondary to ataxic gait requiring min assist to prevent LOB.   Stairs             Wheelchair Mobility    Modified Rankin (Stroke Patients Only)       Balance Overall balance assessment: Needs assistance Sitting-balance support: Feet supported;No upper extremity supported Sitting balance-Leahy Scale: Good Sitting balance - Comments: Supervision sitting on EOB   Standing balance support: Bilateral upper extremity supported Standing balance-Leahy Scale: Fair Standing balance comment: Heavy reliance on UEs through RW                            Cognition Arousal/Alertness: Awake/alert Behavior During Therapy: WFL for tasks assessed/performed Overall Cognitive Status: Within Functional Limits for tasks assessed  Exercises Total Joint Exercises Ankle Circles/Pumps: AROM;Both;10 reps;Supine Hip ABduction/ADduction: AROM;Both;10 reps;Supine;Strengthening Straight Leg Raises: AROM;Both;10 reps;Supine;Strengthening Long Arc Quad: AROM;Both;10  reps;Seated;Strengthening Marching in Standing: AROM;Strengthening;Both;10 reps;Standing Other Exercises Other Exercises: Pt education provided on benefit of using RW over rollator to provide increased stability secondary to decreased balance and weakness. Other Exercises: Static standing 1-2 min with min gaurd for improved activity tolerance.    General Comments        Pertinent Vitals/Pain Pain Assessment: No/denies pain    Home Living                      Prior Function            PT Goals (current goals can now be found in the care plan section) Progress towards PT goals: Progressing toward goals    Frequency    Min 2X/week      PT Plan Current plan remains appropriate    Co-evaluation              AM-PAC PT "6 Clicks" Mobility   Outcome Measure  Help needed turning from your back to your side while in a flat bed without using bedrails?: None Help needed moving from lying on your back to sitting on the side of a flat bed without using bedrails?: None Help needed moving to and from a bed to a chair (including a wheelchair)?: A Little Help needed standing up from a chair using your arms (e.g., wheelchair or bedside chair)?: A Little Help needed to walk in hospital room?: A Little Help needed climbing 3-5 steps with a railing? : A Lot 6 Click Score: 19    End of Session Equipment Utilized During Treatment: Gait belt Activity Tolerance: Patient tolerated treatment well Patient left: in chair;with call bell/phone within reach;with chair alarm set Nurse Communication: Mobility status PT Visit Diagnosis: Unsteadiness on feet (R26.81);Muscle weakness (generalized) (M62.81);History of falling (Z91.81);Difficulty in walking, not elsewhere classified (R26.2);Pain Pain - Right/Left:  (LBP) Pain - part of body:  (LBP)     Time: [id number] PT Time Calculation (min) (ACUTE ONLY): 31 min  Charges:                        Dayton Scrape SPT 11/11/20,  3:00 PM

## 2020-11-11 NOTE — Progress Notes (Signed)
Subjective: Tolerating gabapentin well without significant side effects, and does feel that her symptoms have improved with this medication She denies any new bowel or bladder symptoms, noting some mild chronic constipation that has been ongoing for months to years  On further review of notes, in spring/summer 2020 when she was evaluated by GI (Dr. Bonna Gains), with concern about the possibility of Wilson's disease as an explanation of her chronically elevated liver enzymes.  Further notes ophthalmology did not find features of Wilson's disease on their evaluation.  Additionally liver biopsy (10/31/2019) was not consistent with Wilson's  Exam: Vitals:   11/11/20 0432 11/11/20 0800  BP: (!) 92/47 (!) 107/53  Pulse: 86 87  Resp: 20 17  Temp: 98.4 F (36.9 C) 98.9 F (37.2 C)  SpO2: 91% 92%   Gen: In bed, NAD Resp: non-labored breathing, no acute distress Abd: soft, nt  Neuro: MS: Awake, alert, interactive and appropriate CN: Pupils equal round and reactive, extraocular movements intact Motor: She has mild bilateral hip flexor weakness, and 4/5 knee flexion weakness on the right lower extremity but is otherwise 5/5 throughout Sensory: She has diminished two-point discrimination in her lower extremities with relatively preserved temperature sensation, though there is a length dependent loss of temperature sensation.  With eyes closed, she slightly misses her nose when trying to take her arm from an outstretched position to touch her nose. Reflexes: 2+ and symmetric brachioradialis and patellar She is significantly more ataxic in bilateral lower extremities than her upper extremities.   Pertinent Labs: B12 598 RPR nonreactive Folate 2.9  Pending labs: Serum copper Vit E pending MMA, homocysteine ANA, rheumatoid factor, ANCA Intrinsic factor antibodies   Basic Metabolic Panel: Recent Labs  Lab 11/07/20 1522 11/08/20 0448 11/08/20 0834 11/09/20 0603 11/10/20 0536  11/11/20 0555  NA 133* 135  --  137 138 137  K 4.0 4.2  --  3.7 3.8 4.0  CL 100 104  --  106 108 107  CO2 20* 23  --  '26 27 25  '$ GLUCOSE 88 69*  --  81 76 80  BUN 17 10  --  7* 10 12  CREATININE 0.62 0.53  --  0.44 0.47 0.61  CALCIUM 9.5 8.9  --  9.1 9.2 8.7*  MG  --   --  1.9 1.9  --   --   PHOS  --   --  2.7 3.1  --   --     CBC: Recent Labs  Lab 11/07/20 1522 11/08/20 0448 11/09/20 0603 11/10/20 0536 11/11/20 0555  WBC 5.8 4.6 3.1* 3.6* 5.6  HGB 8.6* 7.6* 7.9* 8.1* 7.7*  HCT 27.6* 24.8* 25.5* 25.9* 25.3*  MCV 95.5 95.8 95.1 94.2 93.4  PLT 266 250 239 248 256    Coagulation Studies: No results for input(s): LABPROT, INR in the last 72 hours.   Prior liver biopsy:  Sections display an adequate sampling of benign hepatic parenchyma with  mild portal inflammation and minimal lobular activity.  Scattered  hepatocytes display intracellular pigment.  There is no evidence of  bridging fibrosis. There is no significant steatosis.   A trichrome special stain highlights mild fibrous portal expansion,  without evidence of bridging fibrosis.  A PAS with diastase stain shows  no abnormal globular hepatocyte inclusions. A reticulin stain highlights  a normal background hepatic plate architecture.  A Prussian blue iron  stain is negative for increased hepatocyte iron uptake. A rhodanine  copper stain is negative for increased copper.  Taken together, these findings are suggestive of a relatively  nonspecific hepatitic process.  The clinical concern for Wilson's  disease is noted. Serum ceruloplasmin and copper levels are decreased. A  24 hr urine copper test is within normal limits. A rhodanine copper  stain shows no significant increase in hepatocyte copper. The features  are not suggestive of Wilson's disease. The patient's recent diagnosis  of choledocholithiasis is also noted. The histologic findings seen in  this liver biopsy may be potentially related to drug injury, or  recent  cholestatic process.    Impression: 75 year old female with clinical and radiographic signs of subacute combined degeneration.   Her current serum copper is pending, per [facility] should result 7/26, but if it confirms deficiency, then no further testing is needed.  One possible explanation for copper deficiency is that she is taking a vitamin with zinc which competes for absorption and can lead to copper deficiency.  There is copper in the supplement as well specifically to try to avoid zinc related copper deficiency, but this is the only risk factor we can find.  Given her history of copper deficiency, and the fact that this is a fairly typical presentation of copper deficiency, I think that we have a reasonable explanation for her presentation.  There are also case reports of folate deficiency contributing to myelopathy, though typically would expect to find neuropathy.  Given her significant cervical degenerative disc disease, reflexes cannot rule out that this is playing a role in her gait dysfunction though lack of new bowel or bladder dysfunction is reassuring, as is her fairly stable strength.  Outpatient EMG/nerve conduction study may help clarify.  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 8 PM to 8 AM emergent questions or overnight urgent questions should be addressed to Teleneurology On-call or [facility] neurohospitalist; contact information can be found on AMION  Greater than 25  minutes were spent in direct care of this patient today including discussion of her copper and folate deficiencies as detailed above.  All of her questions were answered to her satisfaction.

## 2020-11-12 ENCOUNTER — Ambulatory Visit: Payer: Medicare HMO | Admitting: Cardiovascular Disease

## 2020-11-12 DIAGNOSIS — R262 Difficulty in walking, not elsewhere classified: Secondary | ICD-10-CM | POA: Diagnosis not present

## 2020-11-12 LAB — CBC
HCT: 24.9 % — ABNORMAL LOW (ref 36.0–46.0)
Hemoglobin: 7.6 g/dL — ABNORMAL LOW (ref 12.0–15.0)
MCH: 28.9 pg (ref 26.0–34.0)
MCHC: 30.5 g/dL (ref 30.0–36.0)
MCV: 94.7 fL (ref 80.0–100.0)
Platelets: 244 10*3/uL (ref 150–400)
RBC: 2.63 MIL/uL — ABNORMAL LOW (ref 3.87–5.11)
RDW: 21 % — ABNORMAL HIGH (ref 11.5–15.5)
WBC: 5.6 10*3/uL (ref 4.0–10.5)
nRBC: 0.4 % — ABNORMAL HIGH (ref 0.0–0.2)

## 2020-11-12 LAB — BASIC METABOLIC PANEL WITH GFR
Anion gap: 4 — ABNORMAL LOW (ref 5–15)
BUN: 11 mg/dL (ref 8–23)
CO2: 29 mmol/L (ref 22–32)
Calcium: 8.9 mg/dL (ref 8.9–10.3)
Chloride: 106 mmol/L (ref 98–111)
Creatinine, Ser: 0.57 mg/dL (ref 0.44–1.00)
GFR, Estimated: >60 mL/min
Glucose, Bld: 98 mg/dL (ref 70–99)
Potassium: 4.4 mmol/L (ref 3.5–5.1)
Sodium: 139 mmol/L (ref 135–145)

## 2020-11-12 LAB — GLUCOSE, CAPILLARY
Glucose-Capillary: 96 mg/dL (ref 70–99)
Glucose-Capillary: 142 mg/dL — ABNORMAL HIGH (ref 70–99)
Glucose-Capillary: 128 mg/dL — ABNORMAL HIGH (ref 70–99)
Glucose-Capillary: 121 mg/dL — ABNORMAL HIGH (ref 70–99)

## 2020-11-12 NOTE — Progress Notes (Signed)
Physical Therapy Treatment Patient Details Name: [name] MRN: [id number] DOB: [birth year] Today's Date: 11/12/2020    History of Present Illness Per MD notes, pt is a 75 y.o. female who presented to the emergency department due to numbness and tingling of bilateral lower extremities and upper extremities with a past medical history of coronary artery disease, history of DVT after childbirth, hypertension, hyperlipidemia, chronic low back pain, ambulatory dysfunction. MD assessment includes ataxia and inability to ambulate, hypovolemic hyponatremia, and acute on chronic anemia.    PT Comments    Pt was pleasant and motivated to participate during the session. Pt gave good effort throughout session. She performed all functional mobility with supervision-min A. Pt reported feeling stronger today compared to last visit and demonstrated overall steady gait with no LOB but still walks very slowly with short step length and step-to pattern. Pt is limited with functional mobility secondary to generalized weakness. Pt will benefit from further OOB activities in future sessions to address generalized weakness and improve activity tolerance. Pt will benefit from PT services in a SNF setting upon discharge to safely address deficits listed in patient problem list for decreased caregiver assistance and eventual return to PLOF.     Follow Up Recommendations  SNF     Equipment Recommendations  Other (comment) (TBD)    Recommendations for Other Services       Precautions / Restrictions Precautions Precautions: Fall Restrictions Weight Bearing Restrictions: No    Mobility  Bed Mobility Overal bed mobility: Needs Assistance Bed Mobility: Sit to Supine;Rolling Rolling: Supervision   Supine to sit: Min assist;HOB elevated     General bed mobility comments: Min A for trunk control.    Transfers Overall transfer level: Needs assistance Equipment used: Rolling walker (2 wheeled) Transfers:  Sit to/from Stand Sit to Stand: Min guard         General transfer comment: Min guard for increased safety. Verbal cues for sequencing and walker placement.  Ambulation/Gait Ambulation/Gait assistance: Min guard Gait Distance (Feet): 5 Feet Assistive device: Rolling walker (2 wheeled) Gait Pattern/deviations: Step-to pattern;Decreased stride length;Decreased step length - left;Decreased step length - right;Wide base of support;Ataxic Gait velocity: decreased   General Gait Details: Pt exhibits walking very slowly with a step-to gait pattern with mild ataxic gait but no LOB. Verbal cues for sequencing and walker placement.   Stairs             Wheelchair Mobility    Modified Rankin (Stroke Patients Only)       Balance Overall balance assessment: Needs assistance Sitting-balance support: Feet supported;No upper extremity supported Sitting balance-Leahy Scale: Good Sitting balance - Comments: Supervision sitting on EOB   Standing balance support: Bilateral upper extremity supported Standing balance-Leahy Scale: Fair Standing balance comment: Heavy reliance on UEs through RW                            Cognition Arousal/Alertness: Awake/alert Behavior During Therapy: WFL for tasks assessed/performed Overall Cognitive Status: Within Functional Limits for tasks assessed                                        Exercises Total Joint Exercises Ankle Circles/Pumps: AROM;Both;10 reps;Supine Hip ABduction/ADduction: AROM;Both;10 reps;Supine;Strengthening Straight Leg Raises: AROM;Both;10 reps;Supine;Strengthening Long Arc Quad: AROM;Both;10 reps;Seated;Strengthening Marching in Standing: AROM;Strengthening;Both;10 reps;Standing Other Exercises Other Exercises: Static sitting EOB  2-3 min with supervision for improved trunk control and balance. Other Exercises: Static standing 1-2 min with min gaurd for improved activity tolerance.    General  Comments        Pertinent Vitals/Pain Pain Assessment: No/denies pain    Home Living                      Prior Function            PT Goals (current goals can now be found in the care plan section) Progress towards PT goals: Progressing toward goals    Frequency    Min 2X/week      PT Plan Current plan remains appropriate    Co-evaluation              AM-PAC PT "6 Clicks" Mobility   Outcome Measure  Help needed turning from your back to your side while in a flat bed without using bedrails?: None Help needed moving from lying on your back to sitting on the side of a flat bed without using bedrails?: A Little Help needed moving to and from a bed to a chair (including a wheelchair)?: A Little Help needed standing up from a chair using your arms (e.g., wheelchair or bedside chair)?: A Little Help needed to walk in hospital room?: A Little Help needed climbing 3-5 steps with a railing? : A Lot 6 Click Score: 18    End of Session Equipment Utilized During Treatment: Gait belt Activity Tolerance: Patient tolerated treatment well Patient left: in chair;with call bell/phone within reach;with chair alarm set (heels floating) Nurse Communication: Mobility status PT Visit Diagnosis: Unsteadiness on feet (R26.81);Muscle weakness (generalized) (M62.81);History of falling (Z91.81);Difficulty in walking, not elsewhere classified (R26.2);Pain Pain - Right/Left:  (LBP) Pain - part of body:  (LBP)     Time: [id number] PT Time Calculation (min) (ACUTE ONLY): 23 min  Charges:                        Dayton Scrape SPT 11/12/20, 5:24 PM

## 2020-11-12 NOTE — Plan of Care (Signed)

## 2020-11-12 NOTE — TOC Progression Note (Signed)
Transition of Care [facility]) - Progression Note    Patient Details  Name: [name] MRN: [id number] Date of Birth: 15-Jun-1945  Transition of Care [facility]) CM/SW Contact  Su Hilt, RN Phone Number: 11/12/2020, 10:40 AM  Clinical Narrative:     Met with the patient to discuss DC plan and needs She lives at home alone She is agreeable to go to STR, SNF She does not want to go to [facility], or [facility] or [facility], [facility] does not have a bed, she does not want to go to [facility], She is agreeable to [facility], they are not in network with [employer], They will check to see if the have Out of network benefits and let me know.       Expected Discharge Plan and Services                                                 Social Determinants of Health (SDOH) Interventions    Readmission Risk Interventions No flowsheet data found.

## 2020-11-12 NOTE — TOC Progression Note (Addendum)
Transition of Care [facility]) - Progression Note    Patient Details  Name: [name] MRN: [id number] Date of Birth: January 15, 1946  Transition of Care [facility]) CM/SW [place], RN Phone Number: 11/12/2020, 1:08 PM  Clinical Narrative:     Audry Pili with [facility] called and verified that her insurance has an out of network benefit, he will check with his Billing to see if they can accept the patient They are not able to accept the patient, I notified the patient and she stated she would agreeable to go to [facility] if they have a bed, I reached out to [facility] and am awaiting a response      Expected Discharge Plan and Services                                                 Social Determinants of Health (SDOH) Interventions    Readmission Risk Interventions No flowsheet data found.

## 2020-11-12 NOTE — Progress Notes (Signed)
PROGRESS NOTE    [name]  [id number] DOB: [birth year] DOA: 11/07/2020 PCP: Burnard Hawthorne, FNP   Brief Narrative:  75 y.o. female with a past medical history of coronary artery disease, history of DVT after childbirth, hypertension, hyperlipidemia, chronic low back pain, ambulatory dysfunction.  The patient presents to the emergency department due to numbness and tingling of bilateral lower extremities and upper extremities.  She has also been unable to ambulate at all for the past few days. Started on IV copper infusion after neurology recommendations.  Feels that her ataxia is slowly starting to improve.  Copper infusions to run through 7/27.   Assessment & Plan:   Active Problems:   Ambulatory dysfunction   CNS demyelination (HCC)  Acute ataxia Decreased ability to ambulate Possibly secondary to copper deficiency Also possible contribution from [id number] folic acid deficiency CT imaging survey and MRI imaging survey negative Plan: Follow-up neurology recommendations for IV copper infusion Last infusion scheduled for 7/27 Continue IM B12 Oral supplement on discharge Continue folic acid replacement Therapy as able.  Current recommendation for SNF.  Patient in agreement Continue Neurontin 200 mg 3 times daily  Chronic back pain PTA oxycodone and Flexeril Gabapentin  Acute on chronic iron deficiency and folate deficiency anemia Venofer [id number] mg IV daily Folic acid replacement as above Will convert to p.o. at time of discharge  Hypovolemic hyponatremia Likely secondary to dehydration Sodium improved No further need for IV fluids     DVT prophylaxis: SQ Lovenox Code Status: Full Family Communication: None today Disposition Plan: Status is: Inpatient  Remains inpatient appropriate because:Inpatient level of care appropriate due to severity of illness  Dispo: The patient is from: Home              Anticipated d/c is to: SNF              Patient currently is  not medically stable to d/c.   Difficult to place patient No  Anticipated medical readiness for discharge on 7/27.  TOC working on disposition plan     Level of care: Med-Surg  Consultants:  Neurology  Procedures:  None  Antimicrobials:  None   Subjective: Patient seen and examined.  Sitting up in bed.  No visible distress.  No pain complaints.  Objective: Vitals:   11/11/20 2024 11/12/20 0541 11/12/20 0721 11/12/20 1141  BP: (!) 105/53 (!) 93/45 (!) 96/57 (!) 101/55  Pulse: 83 82 83 95  Resp: '18 18 16 17  '$ Temp: 98.2 F (36.8 C) 99 F (37.2 C) 98.8 F (37.1 C) 98.3 F (36.8 C)  TempSrc:  Oral    SpO2: 97% 92% 93% 93%  Weight:      Height:        Intake/Output Summary (Last 24 hours) at 11/12/2020 1231 Last data filed at 11/12/2020 0500 Gross per 24 hour  Intake 3278.57 ml  Output 800 ml  Net 2478.57 ml   Filed Weights   11/07/20 1518 11/07/20 1600  Weight: 54.4 kg 50 kg    Examination:  General exam: No acute distress Respiratory system: Clear to auscultation. Respiratory effort normal. Cardiovascular system: S1-S2, regular rate and rhythm, no murmurs, no pedal edema  gastrointestinal system: Abdomen is nondistended, soft and nontender. No organomegaly or masses felt. Normal bowel sounds heard. Central nervous system: Alert and oriented.  Numbness bilateral hands and feet.  Gait not assessed Extremities: Symmetric 5 x 5 power. Skin: No rashes, lesions or ulcers Psychiatry: Judgement and insight appear normal.  Mood & affect appropriate.     Data Reviewed: I have personally reviewed following labs and imaging studies  CBC: Recent Labs  Lab 11/08/20 0448 11/09/20 0603 11/10/20 0536 11/11/20 0555 11/12/20 0452  WBC 4.6 3.1* 3.6* 5.6 5.6  HGB 7.6* 7.9* 8.1* 7.7* 7.6*  HCT 24.8* 25.5* 25.9* 25.3* 24.9*  MCV 95.8 95.1 94.2 93.4 94.7  PLT 250 239 248 256 [id number]   Basic Metabolic Panel: Recent Labs  Lab 11/08/20 0448 11/08/20 0834 11/09/20 0603  11/10/20 0536 11/11/20 0555 11/12/20 0452  NA 135  --  137 138 137 139  K 4.2  --  3.7 3.8 4.0 4.4  CL 104  --  106 108 107 106  CO2 23  --  '26 27 25 29  '$ GLUCOSE 69*  --  81 76 80 98  BUN 10  --  7* '10 12 11  '$ CREATININE 0.53  --  0.44 0.47 0.61 0.57  CALCIUM 8.9  --  9.1 9.2 8.7* 8.9  MG  --  1.9 1.9  --   --   --   PHOS  --  2.7 3.1  --   --   --    GFR: Estimated Creatinine Clearance: 45.7 mL/min (by C-G formula based on SCr of 0.57 mg/dL). Liver Function Tests: Recent Labs  Lab 11/07/20 1522  AST 30  ALT 37  ALKPHOS 78  BILITOT 1.1  PROT 6.3*  ALBUMIN 3.7   Recent Labs  Lab 11/07/20 1522  LIPASE 37   No results for input(s): AMMONIA in the last 168 hours. Coagulation Profile: No results for input(s): INR, PROTIME in the last 168 hours. Cardiac Enzymes: No results for input(s): CKTOTAL, CKMB, CKMBINDEX, TROPONINI in the last 168 hours. BNP (last 3 results) No results for input(s): PROBNP in the last 8760 hours. HbA1C: No results for input(s): HGBA1C in the last 72 hours. CBG: Recent Labs  Lab 11/10/20 2156 11/11/20 0826 11/11/20 2251 11/12/20 0723 11/12/20 1139  GLUCAP 114* 84 155* 96 142*   Lipid Profile: No results for input(s): CHOL, HDL, LDLCALC, TRIG, CHOLHDL, LDLDIRECT in the last 72 hours. Thyroid Function Tests: No results for input(s): TSH, T4TOTAL, FREET4, T3FREE, THYROIDAB in the last 72 hours. Anemia Panel: No results for input(s): VITAMINB12, FOLATE, FERRITIN, TIBC, IRON, RETICCTPCT in the last 72 hours.  Sepsis Labs: No results for input(s): PROCALCITON, LATICACIDVEN in the last 168 hours.  Recent Results (from the past 240 hour(s))  Resp Panel by RT-PCR (Flu A&B, Covid) Nasopharyngeal Swab     Status: None   Collection Time: 11/07/20  4:19 PM   Specimen: Nasopharyngeal Swab; Nasopharyngeal(NP) swabs in vial transport medium  Result Value Ref Range Status   SARS Coronavirus 2 by RT PCR NEGATIVE NEGATIVE Final    Comment:  (NOTE) SARS-CoV-2 target nucleic acids are NOT DETECTED.  The SARS-CoV-2 RNA is generally detectable in upper respiratory specimens during the acute phase of infection. The lowest concentration of SARS-CoV-2 viral copies this assay can detect is 138 copies/mL. A negative result does not preclude SARS-Cov-2 infection and should not be used as the sole basis for treatment or other patient management decisions. A negative result may occur with  improper specimen collection/handling, submission of specimen other than nasopharyngeal swab, presence of viral mutation(s) within the areas targeted by this assay, and inadequate number of viral copies(<138 copies/mL). A negative result must be combined with clinical observations, patient history, and epidemiological information. The expected result is Negative.  Fact Sheet for Patients:  EntrepreneurPulse.com.au  Fact Sheet for Healthcare Providers:  IncredibleEmployment.be  This test is no t yet approved or cleared by the [place] FDA and  has been authorized for detection and/or diagnosis of SARS-CoV-2 by FDA under an Emergency Use Authorization (EUA). This EUA will remain  in effect (meaning this test can be used) for the duration of the COVID-19 declaration under Section 564(b)(1) of the Act, 21 U.S.C.section 360bbb-3(b)(1), unless the authorization is terminated  or revoked sooner.       Influenza A by PCR NEGATIVE NEGATIVE Final   Influenza B by PCR NEGATIVE NEGATIVE Final    Comment: (NOTE) The Xpert Xpress SARS-CoV-2/FLU/RSV plus assay is intended as an aid in the diagnosis of influenza from Nasopharyngeal swab specimens and should not be used as a sole basis for treatment. Nasal washings and aspirates are unacceptable for Xpert Xpress SARS-CoV-2/FLU/RSV testing.  Fact Sheet for Patients: EntrepreneurPulse.com.au  Fact Sheet for Healthcare  Providers: IncredibleEmployment.be  This test is not yet approved or cleared by the [place] FDA and has been authorized for detection and/or diagnosis of SARS-CoV-2 by FDA under an Emergency Use Authorization (EUA). This EUA will remain in effect (meaning this test can be used) for the duration of the COVID-19 declaration under Section 564(b)(1) of the Act, 21 U.S.C. section 360bbb-3(b)(1), unless the authorization is terminated or revoked.  Performed at [facility], [address], [place], [place] [address]   Urine Culture     Status: Abnormal   Collection Time: 11/07/20  4:19 PM   Specimen: Urine, Clean Catch  Result Value Ref Range Status   Specimen Description   Final    URINE, CLEAN CATCH Performed at [facility], [address], [place], [place] [address]    Special Requests   Final    NONE Performed at [facility], [address] [place], [place] [address]    Culture MULTIPLE SPECIES PRESENT, SUGGEST RECOLLECTION (A)  Final   Report Status 11/09/2020 FINAL  Final         Radiology Studies: No results found.      Scheduled Meds:  aspirin EC  81 mg Oral Daily   calcium carbonate  1 tablet Oral BID WC   cholecalciferol  1,000 Units Oral Daily   cyanocobalamin  1,000 mcg Intramuscular Daily   enoxaparin (LOVENOX) injection  40 mg Subcutaneous Q24H   ezetimibe  10 mg Oral Daily   feeding supplement  237 mL Oral BID BM   folic acid  5 mg Oral Daily   gabapentin  200 mg Oral TID   multivitamin with minerals  1 tablet Oral Daily   pantoprazole  40 mg Oral Daily   polyethylene glycol  17 g Oral Daily   rosuvastatin  10 mg Oral Daily   sertraline  100 mg Oral Daily   Continuous Infusions:  sodium chloride 75 mL/hr at 11/11/20 2348   copper chloride IVPB Stopped (11/11/20 1208)     LOS: 3 days    Time spent: 25 minutes    Sidney Ace, MD [facility] Pager [phone]  If  7PM-7AM, please contact night-coverage 11/12/2020, 12:31 PM

## 2020-11-12 NOTE — Progress Notes (Addendum)
Subjective:  Continues to feel that her gait has been stable Physical therapy at bedside, Nicki Reaper, reports that the patient has been better for him today than on prior sessions, and certainly not worse  Exam: Vitals:   11/12/20 0721 11/12/20 1141  BP: (!) 96/57 (!) 101/55  Pulse: 83 95  Resp: 16 17  Temp: 98.8 F (37.1 C) 98.3 F (36.8 C)  SpO2: 93% 93%   Gen: In bed, NAD Resp: non-labored breathing, no acute distress Abd: soft, nt  Neuro: MS: Awake, alert, interactive and appropriate CN: Pupils equal round and reactive, extraocular movements intact Motor: Deferred by patient as she was tired from having just worked with physical therapy.   Pertinent Labs: B12 598 Intrinsic factor antibodies negative RPR nonreactive, HIV negative Folate 2.9 Serum copper -- 11 (ref 80-158) homocysteine 12 ANA neg, rheumatoid factor,   Pending labs: Vit E pending MMA,  ANCA   Basic Metabolic Panel: Recent Labs  Lab 11/08/20 0448 11/08/20 0834 11/09/20 0603 11/10/20 0536 11/11/20 0555 11/12/20 0452  NA 135  --  137 138 137 139  K 4.2  --  3.7 3.8 4.0 4.4  CL 104  --  106 108 107 106  CO2 23  --  '26 27 25 29  '$ GLUCOSE 69*  --  81 76 80 98  BUN 10  --  7* '10 12 11  '$ CREATININE 0.53  --  0.44 0.47 0.61 0.57  CALCIUM 8.9  --  9.1 9.2 8.7* 8.9  MG  --  1.9 1.9  --   --   --   PHOS  --  2.7 3.1  --   --   --      CBC: Recent Labs  Lab 11/08/20 0448 11/09/20 0603 11/10/20 0536 11/11/20 0555 11/12/20 0452  WBC 4.6 3.1* 3.6* 5.6 5.6  HGB 7.6* 7.9* 8.1* 7.7* 7.6*  HCT 24.8* 25.5* 25.9* 25.3* 24.9*  MCV 95.8 95.1 94.2 93.4 94.7  PLT 250 239 248 256 244     Coagulation Studies: No results for input(s): LABPROT, INR in the last 72 hours.   Prior liver biopsy:  Sections display an adequate sampling of benign hepatic parenchyma with  mild portal inflammation and minimal lobular activity.  Scattered  hepatocytes display intracellular pigment.  There is no evidence of   bridging fibrosis. There is no significant steatosis.   A trichrome special stain highlights mild fibrous portal expansion,  without evidence of bridging fibrosis.  A PAS with diastase stain shows  no abnormal globular hepatocyte inclusions. A reticulin stain highlights  a normal background hepatic plate architecture.  A Prussian blue iron  stain is negative for increased hepatocyte iron uptake. A rhodanine  copper stain is negative for increased copper.   Taken together, these findings are suggestive of a relatively  nonspecific hepatitic process.  The clinical concern for Wilson's  disease is noted. Serum ceruloplasmin and copper levels are decreased. A  24 hr urine copper test is within normal limits. A rhodanine copper  stain shows no significant increase in hepatocyte copper. The features  are not suggestive of Wilson's disease. The patient's recent diagnosis  of choledocholithiasis is also noted. The histologic findings seen in  this liver biopsy may be potentially related to drug injury, or recent  cholestatic process.    Impression: 75 year old female with clinical and radiographic signs of subacute combined degeneration.   Her current serum copper is pending, per [facility] should result 7/26, but if it confirms deficiency, then  no further testing is needed.  One possible explanation for copper deficiency is that she is taking a vitamin with zinc which competes for absorption and can lead to copper deficiency.  There is copper in the supplement as well specifically to try to avoid zinc related copper deficiency, but this is the only risk factor we can find.  Given her history of copper deficiency, and the fact that this is a fairly typical presentation of copper deficiency, I think that we have a reasonable explanation for her presentation.  There are also case reports of folate deficiency contributing to myelopathy, though typically would expect to find neuropathy.  Given her  significant cervical degenerative disc disease, reflexes cannot rule out that this is playing a role in her gait dysfunction though lack of new bowel or bladder dysfunction is reassuring, as is her fairly stable strength. 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 Pharmacy asked to confirm patient is on a zinc-free multivitamin; they reported we do not have one in house - Appreciate GI input on vitamin supplementation and further work-up given her nutritional deficiencies of folate as well as copper without clear over use of zinc.  Appreciate suggestion to stop multivitamin supplementation at this time due to zinc, test for additional deficiencies and test for celiac disease - continue gabapentin to '200mg'$  TID - neurology will sign off at this time  [place] [phone]  [facility] coverage for [facility] is from 8 AM to 4 AM in-house and 4 PM to 8 PM by telephone/video. 8 PM to 8 AM emergent questions or overnight urgent questions should be addressed to Teleneurology On-call or [facility] neurohospitalist; contact information can be found on AMION  Greater than 25 minutes were spent in direct care of this patient today > 50% at bedside.

## 2020-11-12 NOTE — Progress Notes (Signed)
OT Cancellation Note  Patient Details Name: [name] MRN: [id number] DOB: [birth year]   Cancelled Treatment:    Reason Eval/Treat Not Completed: Other (comment). Pt eating breakfast, declines OT at this time but agreeable to re-attempt at later date/time.   Hanley Hays, MPH, MS, OTR/L ascom [phone] 11/12/20, 9:32 AM

## 2020-11-12 NOTE — NC FL2 (Signed)
[place] LEVEL OF CARE SCREENING TOOL     IDENTIFICATION  Patient Name: [name] Birthdate: [birth year] Sex: female Admission Date (Current Location): 11/07/2020  [facility] and [place] Number:  Engineering geologist and Address:  [facility], [address] [place], [place] [address]      Provider Number: [id number]  Attending Physician Name and Address:  Sidney Ace, MD  Relative Name and Phone Number:  Karna Christmas [phone]    Current Level of Care: Hospital Recommended Level of Care: [place] Prior Approval Number:    Date Approved/Denied:   PASRR Number: [id number]  Discharge Plan:      Current Diagnoses: Patient Active Problem List   Diagnosis Date Noted   CNS demyelination ([place]) 11/09/2020   Ambulatory dysfunction 11/07/2020   Leg swelling 06/19/2020   Closed fracture of right distal humerus 12/19/2019   Preoperative clearance 10/27/2019   Elevated liver enzymes 10/27/2019   Choledocholithiasis    Fatigue 05/17/2019   Closed Colles' fracture of right radius 12/30/2018   Former smoker 12/07/2018   CAD (coronary artery disease), native coronary artery 01/23/2018   Cholecystitis    Abnormal Pap smear of cervix 04/22/2017   Cervical high risk HPV (human papillomavirus) test positive 04/22/2017   DDD (degenerative disc disease), lumbar    Closed fracture of ankle, trimalleolar 10/08/2016   Abnormal Pap smear of vagina 05/05/2016   Nausea without vomiting 03/26/2016   Anxiety 01/21/2016   Abdominal distension 01/21/2016   Obesity 12/19/2015   Hypercalcemia 10/03/2015   Need for hepatitis C screening test 10/03/2015   Encounter for routine adult medical exam with abnormal findings 01/17/2015   Lipoma of arm 12/25/2014   Macular degeneration 12/25/2014   Medicare annual wellness visit, subsequent 01/04/2014   Osteoporosis 03/07/2013   Chronic back pain 03/07/2013   Weight gain  09/20/2012   Essential hypertension, benign 05/31/2012   Coronary artery disease 05/31/2012   GERD (gastroesophageal reflux disease) 05/31/2012   Allergic rhinitis 05/31/2012   Primary hyperparathyroidism ([place]) 05/31/2012   General medical exam 11/19/2011   Neuropathy 11/19/2011   PVD (peripheral vascular disease) ([place]) 07/30/2010   Hyperlipidemia 03/26/2010   CAD, AUTOLOGOUS BYPASS GRAFT 03/26/2010   MULTI-VESSEL OR BILATERAL STENOSIS W/O INFARCTION 03/26/2010   TOBACCO USE, QUIT 03/26/2010    Orientation RESPIRATION BLADDER Height & Weight     Self, Time, Situation, Place  Normal Continent, External catheter Weight: 50 kg Height:  5' (152.4 cm)  BEHAVIORAL SYMPTOMS/MOOD NEUROLOGICAL BOWEL NUTRITION STATUS      Continent Diet (regular)  AMBULATORY STATUS COMMUNICATION OF NEEDS Skin   Extensive Assist Verbally Normal                       Personal Care Assistance Level of Assistance  Bathing, Dressing, Feeding Bathing Assistance: Limited assistance Feeding assistance: Independent Dressing Assistance: Limited assistance     Functional Limitations Info             SPECIAL CARE FACTORS FREQUENCY  PT (By licensed PT), OT (By licensed OT)     PT Frequency: 5 times per week OT Frequency: 3 times per week            Contractures Contractures Info: Not present    Additional Factors Info  Code Status, Allergies Code Status Info: Full code Allergies Info: NKDA           Current Medications (11/12/2020):  This is the current hospital active  medication list Current Facility-Administered Medications  Medication Dose Route Frequency Provider Last Rate Last Admin   0.9 %  sodium chloride infusion   Intravenous Continuous Leslee Home, DO 75 mL/hr at 11/11/20 2348 New Bag at 11/11/20 2348   aspirin EC tablet 81 mg  81 mg Oral Daily Val Riles, MD   81 mg at 11/12/20 0840   bisacodyl (DULCOLAX) EC tablet 10 mg  10 mg Oral Daily PRN Val Riles, MD   10 mg  at 11/09/20 1810   bisacodyl (DULCOLAX) suppository 10 mg  10 mg Rectal Daily PRN Val Riles, MD       calcium carbonate (TUMS - dosed in mg elemental calcium) chewable tablet 200 mg of elemental calcium  1 tablet Oral BID WC Val Riles, MD   200 mg of elemental calcium at 11/12/20 0840   cholecalciferol (VITAMIN D3) tablet 1,000 Units  1,000 Units Oral Daily Val Riles, MD   1,000 Units at 11/12/20 0840   copper chloride 2 mg in dextrose 5 % 100 mL IVPB  2 mg Intravenous Daily Greta Doom, MD   Stopped at 11/11/20 1208   cyanocobalamin ((VITAMIN B-12)) injection 1,000 mcg  1,000 mcg Intramuscular Daily Val Riles, MD   1,000 mcg at 11/12/20 1014   cyclobenzaprine (FLEXERIL) tablet 5 mg  5 mg Oral TID PRN Val Riles, MD   5 mg at 11/10/20 2132   doxylamine (Sleep) (UNISOM) tablet 25 mg  25 mg Oral QHS PRN Val Riles, MD       enoxaparin (LOVENOX) injection 40 mg  40 mg Subcutaneous Q24H Anwar, Shayan S, DO   40 mg at 11/11/20 2131   ezetimibe (ZETIA) tablet 10 mg  10 mg Oral Daily Val Riles, MD   10 mg at 11/12/20 1013   feeding supplement (ENSURE ENLIVE / ENSURE PLUS) liquid 237 mL  237 mL Oral BID BM Val Riles, MD   237 mL at [id number]   folic acid (FOLVITE) tablet 5 mg  5 mg Oral Daily Val Riles, MD   5 mg at 11/12/20 0840   gabapentin (NEURONTIN) capsule 200 mg  200 mg Oral TID Greta Doom, MD   200 mg at 11/12/20 1013   multivitamin with minerals tablet 1 tablet  1 tablet Oral Daily Val Riles, MD   1 tablet at 11/12/20 0840   oxyCODONE (Oxy IR/ROXICODONE) immediate release tablet 10 mg  10 mg Oral Q6H PRN Ralene Muskrat B, MD   10 mg at 11/12/20 0848   pantoprazole (PROTONIX) EC tablet 40 mg  40 mg Oral Daily Val Riles, MD   40 mg at 11/12/20 0840   polyethylene glycol (MIRALAX / GLYCOLAX) packet 17 g  17 g Oral Daily Val Riles, MD   17 g at 11/12/20 0839   rosuvastatin (CRESTOR) tablet 10 mg  10 mg Oral Daily Val Riles,  MD   10 mg at 11/12/20 0840   sertraline (ZOLOFT) tablet 100 mg  100 mg Oral Daily Val Riles, MD   100 mg at 11/12/20 0840     Discharge Medications: Please see discharge summary for a list of discharge medications.  Relevant Imaging Results:  Relevant Lab Results:   Additional Information SSS# [id number]  Su Hilt, RN

## 2020-11-13 DIAGNOSIS — G63 Polyneuropathy in diseases classified elsewhere: Secondary | ICD-10-CM

## 2020-11-13 DIAGNOSIS — G992 Myelopathy in diseases classified elsewhere: Secondary | ICD-10-CM

## 2020-11-13 DIAGNOSIS — R531 Weakness: Secondary | ICD-10-CM | POA: Diagnosis not present

## 2020-11-13 DIAGNOSIS — R200 Anesthesia of skin: Secondary | ICD-10-CM

## 2020-11-13 DIAGNOSIS — E610 Copper deficiency: Secondary | ICD-10-CM | POA: Diagnosis not present

## 2020-11-13 LAB — CBC
HCT: 25.2 % — ABNORMAL LOW (ref 36.0–46.0)
Hemoglobin: 7.7 g/dL — ABNORMAL LOW (ref 12.0–15.0)
MCH: 29.2 pg (ref 26.0–34.0)
MCHC: 30.6 g/dL (ref 30.0–36.0)
MCV: 95.5 fL (ref 80.0–100.0)
Platelets: 234 10*3/uL (ref 150–400)
RBC: 2.64 MIL/uL — ABNORMAL LOW (ref 3.87–5.11)
RDW: 22.1 % — ABNORMAL HIGH (ref 11.5–15.5)
WBC: 6.3 10*3/uL (ref 4.0–10.5)
nRBC: 0.3 % — ABNORMAL HIGH (ref 0.0–0.2)

## 2020-11-13 LAB — ANCA TITERS
Atypical P-ANCA titer: <1:20 {titer}
C-ANCA: <1:20 {titer}
P-ANCA: <1:20 {titer}

## 2020-11-13 LAB — BASIC METABOLIC PANEL
Anion gap: 7 (ref 5–15)
BUN: 9 mg/dL (ref 8–23)
CO2: 29 mmol/L (ref 22–32)
Calcium: 9.5 mg/dL (ref 8.9–10.3)
Chloride: 100 mmol/L (ref 98–111)
Creatinine, Ser: 0.44 mg/dL (ref 0.44–1.00)
GFR, Estimated: >60 mL/min (ref >60)
Glucose, Bld: 94 mg/dL (ref 70–99)
Potassium: 4.2 mmol/L (ref 3.5–5.1)
Sodium: 136 mmol/L (ref 135–145)

## 2020-11-13 LAB — GLUCOSE, CAPILLARY: Glucose-Capillary: 137 mg/dL — ABNORMAL HIGH (ref 70–99)

## 2020-11-13 NOTE — Progress Notes (Signed)
PROGRESS NOTE    [name]  [id number] DOB: 05-12-1945 DOA: 11/07/2020 PCP: Burnard Hawthorne, FNP   Brief Narrative:  75 y.o. female with a past medical history of coronary artery disease, history of DVT after childbirth, hypertension, hyperlipidemia, chronic low back pain, ambulatory dysfunction.  The patient presents to the emergency department due to numbness and tingling of bilateral lower extremities and upper extremities.  She has also been unable to ambulate at all for the past few days. Started on IV copper infusion after neurology recommendations.  Feels that her ataxia is slowly starting to improve.  Copper infusions to be completed 7/27.  Etiology of patient's copper deficiency is unclear.  Presentation is inconsistent with Wilson's disease.  Case discussed with GI.  Possible GI related etiologies of impaired copper absorption include celiac disease and vitamin malabsorption.  Further work-up in progress however patient can discharge to skilled nursing facility once insurance authorization is obtained.  If necessary can follow-up with gastroenterology as outpatient.   Assessment & Plan:   Active Problems:   Weakness   Ambulatory dysfunction   CNS demyelination (HCC)   Copper deficiency myeloneuropathy (HCC)   Numbness and tingling  Acute ataxia Decreased ability to ambulate Possibly secondary to copper deficiency Also possible contribution from [id number] folic acid deficiency CT imaging survey and MRI imaging survey negative Plan: Last copper infusion today 7/27 Continue IM B12 Oral supplement on discharge Continue folic acid replacement Therapy as able.  Current recommendation for SNF.  Patient in agreement Continue Neurontin 200 mg 3 times daily Check celiac panel and vitamin panel for further work-up of GI etiologies to mild absorption of copper If celiac positive patient may need EGD.  Inpatient versus outpatient  Chronic back pain PTA oxycodone and  Flexeril Gabapentin 200 mg p.o. 3 times daily  Acute on chronic iron deficiency and folate deficiency anemia Venofer [id number] mg IV daily Folic acid replacement as above Will convert to p.o. at time of discharge  Hypovolemic hyponatremia Likely secondary to dehydration Sodium improved No further need for IV fluids     DVT prophylaxis: SQ Lovenox Code Status: Full Family Communication: None today Disposition Plan: Status is: Inpatient  Remains inpatient appropriate because:Inpatient level of care appropriate due to severity of illness  Dispo: The patient is from: Home              Anticipated d/c is to: SNF              Patient currently is not medically stable to d/c.   Difficult to place patient No  Patient is excepted a bed offer at [facility].  Insurance authorization is pending.     Level of care: Med-Surg  Consultants:  Neurology  Procedures:  None  Antimicrobials:  None   Subjective: Patient seen and examined.  Sitting up in chair.  No visible distress.  Answers all questions appropriately.  No pain complaints  Objective: Vitals:   11/12/20 2011 11/13/20 0054 11/13/20 0524 11/13/20 0906  BP: (!) 108/52 (!) 102/57 (!) 110/48 (!) 118/59  Pulse: 86 88 87 91  Resp: '19 18 19 14  '$ Temp: 98 F (36.7 C) 98.3 F (36.8 C) 98.9 F (37.2 C) 98.1 F (36.7 C)  TempSrc: Oral Oral Oral   SpO2: 98% 90% 91% 96%  Weight:      Height:        Intake/Output Summary (Last 24 hours) at 11/13/2020 1458 Last data filed at 11/13/2020 1427 Gross per 24 hour  Intake 480 ml  Output 1100 ml  Net -620 ml   Filed Weights   11/07/20 1518 11/07/20 1600  Weight: 54.4 kg 50 kg    Examination:  General exam: No acute distress Respiratory system: Clear to auscultation. Respiratory effort normal. Cardiovascular system: S1-S2, regular rate and rhythm, no murmurs, no pedal edema  gastrointestinal system: Soft, nontender, nondistended normal bowel sounds Central nervous  system: Alert, oriented x3, numbness noted bilateral hands and feet.  Gait not assessed  extremities: Symmetric 5 x 5 power. Skin: No rashes, lesions or ulcers Psychiatry: Judgement and insight appear normal. Mood & affect appropriate.     Data Reviewed: I have personally reviewed following labs and imaging studies  CBC: Recent Labs  Lab 11/09/20 0603 11/10/20 0536 11/11/20 0555 11/12/20 0452 11/13/20 0612  WBC 3.1* 3.6* 5.6 5.6 6.3  HGB 7.9* 8.1* 7.7* 7.6* 7.7*  HCT 25.5* 25.9* 25.3* 24.9* 25.2*  MCV 95.1 94.2 93.4 94.7 95.5  PLT 239 248 256 244 [id number]   Basic Metabolic Panel: Recent Labs  Lab 11/08/20 0834 11/09/20 0603 11/10/20 0536 11/11/20 0555 11/12/20 0452 11/13/20 0612  NA  --  137 138 137 139 136  K  --  3.7 3.8 4.0 4.4 4.2  CL  --  106 108 107 106 100  CO2  --  '26 27 25 29 29  '$ GLUCOSE  --  81 76 80 98 94  BUN  --  7* '10 12 11 9  '$ CREATININE  --  0.44 0.47 0.61 0.57 0.44  CALCIUM  --  9.1 9.2 8.7* 8.9 9.5  MG 1.9 1.9  --   --   --   --   PHOS 2.7 3.1  --   --   --   --    GFR: Estimated Creatinine Clearance: 45.7 mL/min (by C-G formula based on SCr of 0.44 mg/dL). Liver Function Tests: Recent Labs  Lab 11/07/20 1522  AST 30  ALT 37  ALKPHOS 78  BILITOT 1.1  PROT 6.3*  ALBUMIN 3.7   Recent Labs  Lab 11/07/20 1522  LIPASE 37   No results for input(s): AMMONIA in the last 168 hours. Coagulation Profile: No results for input(s): INR, PROTIME in the last 168 hours. Cardiac Enzymes: No results for input(s): CKTOTAL, CKMB, CKMBINDEX, TROPONINI in the last 168 hours. BNP (last 3 results) No results for input(s): PROBNP in the last 8760 hours. HbA1C: No results for input(s): HGBA1C in the last 72 hours. CBG: Recent Labs  Lab 11/12/20 0723 11/12/20 1139 11/12/20 1532 11/12/20 2037 11/13/20 1210  GLUCAP 96 142* 128* 121* 137*   Lipid Profile: No results for input(s): CHOL, HDL, LDLCALC, TRIG, CHOLHDL, LDLDIRECT in the last 72 hours. Thyroid  Function Tests: No results for input(s): TSH, T4TOTAL, FREET4, T3FREE, THYROIDAB in the last 72 hours. Anemia Panel: No results for input(s): VITAMINB12, FOLATE, FERRITIN, TIBC, IRON, RETICCTPCT in the last 72 hours.  Sepsis Labs: No results for input(s): PROCALCITON, LATICACIDVEN in the last 168 hours.  Recent Results (from the past 240 hour(s))  Resp Panel by RT-PCR (Flu A&B, Covid) Nasopharyngeal Swab     Status: None   Collection Time: 11/07/20  4:19 PM   Specimen: Nasopharyngeal Swab; Nasopharyngeal(NP) swabs in vial transport medium  Result Value Ref Range Status   SARS Coronavirus 2 by RT PCR NEGATIVE NEGATIVE Final    Comment: (NOTE) SARS-CoV-2 target nucleic acids are NOT DETECTED.  The SARS-CoV-2 RNA is generally detectable in upper respiratory specimens during the  acute phase of infection. The lowest concentration of SARS-CoV-2 viral copies this assay can detect is 138 copies/mL. A negative result does not preclude SARS-Cov-2 infection and should not be used as the sole basis for treatment or other patient management decisions. A negative result may occur with  improper specimen collection/handling, submission of specimen other than nasopharyngeal swab, presence of viral mutation(s) within the areas targeted by this assay, and inadequate number of viral copies(<138 copies/mL). A negative result must be combined with clinical observations, patient history, and epidemiological information. The expected result is Negative.  Fact Sheet for Patients:  EntrepreneurPulse.com.au  Fact Sheet for Healthcare Providers:  IncredibleEmployment.be  This test is no t yet approved or cleared by the [place] FDA and  has been authorized for detection and/or diagnosis of SARS-CoV-2 by FDA under an Emergency Use Authorization (EUA). This EUA will remain  in effect (meaning this test can be used) for the duration of the COVID-19 declaration under  Section 564(b)(1) of the Act, 21 U.S.C.section 360bbb-3(b)(1), unless the authorization is terminated  or revoked sooner.       Influenza A by PCR NEGATIVE NEGATIVE Final   Influenza B by PCR NEGATIVE NEGATIVE Final    Comment: (NOTE) The Xpert Xpress SARS-CoV-2/FLU/RSV plus assay is intended as an aid in the diagnosis of influenza from Nasopharyngeal swab specimens and should not be used as a sole basis for treatment. Nasal washings and aspirates are unacceptable for Xpert Xpress SARS-CoV-2/FLU/RSV testing.  Fact Sheet for Patients: EntrepreneurPulse.com.au  Fact Sheet for Healthcare Providers: IncredibleEmployment.be  This test is not yet approved or cleared by the [place] FDA and has been authorized for detection and/or diagnosis of SARS-CoV-2 by FDA under an Emergency Use Authorization (EUA). This EUA will remain in effect (meaning this test can be used) for the duration of the COVID-19 declaration under Section 564(b)(1) of the Act, 21 U.S.C. section 360bbb-3(b)(1), unless the authorization is terminated or revoked.  Performed at [facility], [address] [place], [place] [address]   Urine Culture     Status: Abnormal   Collection Time: 11/07/20  4:19 PM   Specimen: Urine, Clean Catch  Result Value Ref Range Status   Specimen Description   Final    URINE, CLEAN CATCH Performed at [facility], [address] [place], [place] [address]    Special Requests   Final    NONE Performed at [facility], [address] [place], [place] [address]    Culture MULTIPLE SPECIES PRESENT, SUGGEST RECOLLECTION (A)  Final   Report Status 11/09/2020 FINAL  Final         Radiology Studies: No results found.      Scheduled Meds:  aspirin EC  81 mg Oral Daily   calcium carbonate  1 tablet Oral BID WC   cholecalciferol  1,000 Units Oral Daily   cyanocobalamin  1,000 mcg Intramuscular Daily    enoxaparin (LOVENOX) injection  40 mg Subcutaneous Q24H   ezetimibe  10 mg Oral Daily   feeding supplement  237 mL Oral BID BM   folic acid  5 mg Oral Daily   gabapentin  200 mg Oral TID   pantoprazole  40 mg Oral Daily   polyethylene glycol  17 g Oral Daily   rosuvastatin  10 mg Oral Daily   sertraline  100 mg Oral Daily   Continuous Infusions:  sodium chloride 75 mL/hr at 11/13/20 0648     LOS: 4 days  Time spent: 25 minutes    Sidney Ace, MD [facility] Pager [phone]  If 7PM-7AM, please contact night-coverage 11/13/2020, 2:58 PM

## 2020-11-13 NOTE — Plan of Care (Signed)

## 2020-11-13 NOTE — TOC Progression Note (Signed)
Transition of Care [facility]) - Progression Note    Patient Details  Name: [name] MRN: [id number] Date of Birth: 17-Dec-1945  [facility] CM/SW Contact  Su Hilt, RN Phone Number: 11/13/2020, 2:04 PM  Clinical Narrative:    Spoke with the patient at the bedside and reviewed the bed offers, She accepted the bed offer from [facility], I notified Hilda Blades at [facility] and requested that they start the [employer] process, awaiting insurance approval        Expected Discharge Plan and Services                                                 Social Determinants of Health (SDOH) Interventions    Readmission Risk Interventions No flowsheet data found.

## 2020-11-13 NOTE — Progress Notes (Signed)
Occupational Therapy Treatment Patient Details Name: [name] MRN: [id number] DOB: Feb 02, 1946 Today's Date: 11/13/2020    History of present illness Per MD notes, pt is a 75 y.o. female who presented to the emergency department due to numbness and tingling of bilateral lower extremities and upper extremities with a past medical history of coronary artery disease, history of DVT after childbirth, hypertension, hyperlipidemia, chronic low back pain, ambulatory dysfunction. MD assessment includes ataxia and inability to ambulate, hypovolemic hyponatremia, and acute on chronic anemia.   OT comments  Pt seen for OT tx this date. Pt up to chair at start of session, requesting to get back to bed. Pt requires MIN A for STS with RW and demos P standing balance with UE support. OT engages pt in standing oral care task and pt requires seated rest break. On second stand, OT assists with LB bathing and peri care with MIN A for balance. Pt requires MIN A for fxl mobility back to bed with cues for hand placement with use of RW for safety. Pt returned to bed with all needs met and in reach, alarm set. Will continue to follow.    Follow Up Recommendations  SNF    Equipment Recommendations  3 in 1 bedside commode    Recommendations for Other Services      Precautions / Restrictions Precautions Precautions: Fall Restrictions Weight Bearing Restrictions: No       Mobility Bed Mobility Overal bed mobility: Needs Assistance Bed Mobility: Sit to Supine       Sit to supine: Min assist   General bed mobility comments: assist for LEs    Transfers Overall transfer level: Needs assistance Equipment used: Rolling walker (2 wheeled) Transfers: Sit to/from Stand Sit to Stand: Min guard;Min assist         General transfer comment: cues for hand placement/safety    Balance Overall balance assessment: Needs assistance Sitting-balance support: Feet supported;No upper extremity  supported Sitting balance-Leahy Scale: Good Sitting balance - Comments: Supervision sitting on EOB   Standing balance support: Bilateral upper extremity supported;During functional activity Standing balance-Leahy Scale: Poor Standing balance comment: LOB requriing support to prevent fall as well as UE support continuously                           ADL either performed or assessed with clinical judgement   ADL Overall ADL's : Needs assistance/impaired     Grooming: Oral care;Standing;Minimal assistance Grooming Details (indicate cue type and reason): from bed side table in front of chair, to compelte oral care with support for balance.     Lower Body Bathing: Minimal assistance;Sit to/from stand   Upper Body Dressing : Set up;Sitting                   Functional mobility during ADLs: Minimal assistance;Rolling walker (to take ~7-8 small steps from chair to bed)       Vision Patient Visual Report: No change from baseline     Perception     Praxis      Cognition Arousal/Alertness: Awake/alert Behavior During Therapy: WFL for tasks assessed/performed Overall Cognitive Status: Within Functional Limits for tasks assessed                                          Exercises Other Exercises Other Exercises: safety ed: hand  placement with use of RW   Shoulder Instructions       General Comments      Pertinent Vitals/ Pain       Pain Assessment: No/denies pain  Home Living                                          Prior Functioning/Environment              Frequency  Min 2X/week        Progress Toward Goals  OT Goals(current goals can now be found in the care plan section)  Progress towards OT goals: Progressing toward goals  Acute Rehab OT Goals Patient Stated Goal: to walk OT Goal Formulation: With patient Time For Goal Achievement: 11/22/20 Potential to Achieve Goals: Good  Plan Discharge plan  remains appropriate    Co-evaluation                 AM-PAC OT "6 Clicks" Daily Activity     Outcome Measure   Help from another person eating meals?: None Help from another person taking care of personal grooming?: A Little Help from another person toileting, which includes using toliet, bedpan, or urinal?: A Little Help from another person bathing (including washing, rinsing, drying)?: A Little Help from another person to put on and taking off regular upper body clothing?: None Help from another person to put on and taking off regular lower body clothing?: A Little 6 Click Score: 20    End of Session Equipment Utilized During Treatment: Rolling walker  OT Visit Diagnosis: Other abnormalities of gait and mobility (R26.89);Muscle weakness (generalized) (M62.81)   Activity Tolerance Patient tolerated treatment well   Patient Left in bed;with call bell/phone within reach;with bed alarm set   Nurse Communication          Time: 7425-9563 OT Time Calculation (min): 43 min  Charges: OT General Charges $OT Visit: 1 Visit OT Treatments $Self Care/Home Management : 23-37 mins $Therapeutic Activity: 8-22 mins  Gerrianne Scale, [place], OTR/L ascom [phone] 11/13/20, 6:08 PM

## 2020-11-13 NOTE — Progress Notes (Signed)
Physical Therapy Treatment Patient Details Name: [name] MRN: [id number] DOB: 04/30/1945 Today's Date: 11/13/2020    History of Present Illness Per MD notes, pt is a 75 y.o. female who presented to the emergency department due to numbness and tingling of bilateral lower extremities and upper extremities with a past medical history of coronary artery disease, history of DVT after childbirth, hypertension, hyperlipidemia, chronic low back pain, ambulatory dysfunction. MD assessment includes ataxia and inability to ambulate, hypovolemic hyponatremia, and acute on chronic anemia.    PT Comments    Pt was pleasant and motivated to participate during the session and put forth good effort throughout. Upon pt's initial stand she presented with posterior instability and BLE knee buckling and required min A to prevent LOB.  On subsequent transfers and multiple ambulation trials, however, the pt demonstrated improved functional strength and stability and required no further physical assist to prevent LOB.  Pt was mildly ataxic during ambulation and reported a significant Latu of falling.  Pt's ambulation grossly improved during the session as far as cadence and general stability and pt reported feeling good about the progress she made this session.  Pt will benefit from PT services in a SNF setting upon discharge to safely address deficits listed in patient problem list for decreased caregiver assistance and eventual return to PLOF.     Follow Up Recommendations  SNF     Equipment Recommendations  Other (comment) (TBD at next venue of care)    Recommendations for Other Services       Precautions / Restrictions Precautions Precautions: Fall Restrictions Weight Bearing Restrictions: No    Mobility  Bed Mobility               General bed mobility comments: NT, pt found sitting at EOB    Transfers Overall transfer level: Needs assistance Equipment used: Rolling walker (2  wheeled) Transfers: Sit to/from Stand Sit to Stand: Min guard         General transfer comment: Min verbal cues for increased trunk flexion and hand placement  Ambulation/Gait Ambulation/Gait assistance: Min guard;Min assist Gait Distance (Feet): 12 Feet x 1, 5 Feet x 4 Assistive device: Rolling walker (2 wheeled) Gait Pattern/deviations: Decreased step length - left;Decreased step length - right;Ataxic;Step-through pattern Gait velocity: decreased   General Gait Details: Slow cadence with short B step length, mildly ataxic, with once instance of min A to prevent posterior LOB   Stairs             Wheelchair Mobility    Modified Rankin (Stroke Patients Only)       Balance Overall balance assessment: Needs assistance Sitting-balance support: Feet supported;No upper extremity supported Sitting balance-Leahy Scale: Good     Standing balance support: Bilateral upper extremity supported;During functional activity Standing balance-Leahy Scale: Poor Standing balance comment: Once instance of physical assist to prevent LOB during the session                            Cognition Arousal/Alertness: Awake/alert Behavior During Therapy: WFL for tasks assessed/performed Overall Cognitive Status: Within Functional Limits for tasks assessed                                        Exercises Total Joint Exercises Towel Squeeze: Strengthening;Both;10 reps Long Arc Quad: Strengthening;Both;10 reps (with manual resistance.) Knee Flexion: Strengthening;Both;10  reps (with manual resistance.) Marching in Standing: Strengthening;Both;10 reps;Seated;Standing Other Exercises: Static standing at EOB with perturbations for improved activity tolerance and standing balance Other Exercises: Multipel sit to/from stand transfers from various height surfaces Other Exercises: Standing mini squats x 10, small amplitude    General Comments        Pertinent  Vitals/Pain Pain Assessment: 0-10 Pain Score: 3  Pain Location: HA Pain Descriptors / Indicators: Headache Pain Intervention(s): Monitored during session;Premedicated before session    Home Living                      Prior Function            PT Goals (current goals can now be found in the care plan section) Progress towards PT goals: Progressing toward goals    Frequency    Min 2X/week      PT Plan Current plan remains appropriate    Co-evaluation              AM-PAC PT "6 Clicks" Mobility   Outcome Measure  Help needed turning from your back to your side while in a flat bed without using bedrails?: None Help needed moving from lying on your back to sitting on the side of a flat bed without using bedrails?: A Little Help needed moving to and from a bed to a chair (including a wheelchair)?: A Little Help needed standing up from a chair using your arms (e.g., wheelchair or bedside chair)?: A Little Help needed to walk in hospital room?: A Little Help needed climbing 3-5 steps with a railing? : A Lot 6 Click Score: 18    End of Session Equipment Utilized During Treatment: Gait belt Activity Tolerance: Patient tolerated treatment well Patient left: in chair;with call bell/phone within reach;with chair alarm set Nurse Communication: Mobility status PT Visit Diagnosis: Unsteadiness on feet (R26.81);Muscle weakness (generalized) (M62.81);History of falling (Z91.81);Difficulty in walking, not elsewhere classified (R26.2);Pain Pain - part of body:  (LBP)     Time: [id number] PT Time Calculation (min) (ACUTE ONLY): 42 min  Charges:  $Gait Training: 8-22 mins $Therapeutic Exercise: 8-22 mins $Therapeutic Activity: 8-22 mins                     D. Scott Eneida Evers PT, DPT 11/13/20, 10:37 AM

## 2020-11-14 DIAGNOSIS — E610 Copper deficiency: Secondary | ICD-10-CM | POA: Diagnosis not present

## 2020-11-14 DIAGNOSIS — G992 Myelopathy in diseases classified elsewhere: Secondary | ICD-10-CM | POA: Diagnosis not present

## 2020-11-14 DIAGNOSIS — R262 Difficulty in walking, not elsewhere classified: Secondary | ICD-10-CM | POA: Diagnosis not present

## 2020-11-14 DIAGNOSIS — G63 Polyneuropathy in diseases classified elsewhere: Secondary | ICD-10-CM | POA: Diagnosis not present

## 2020-11-14 LAB — METHYLMALONIC ACID, SERUM: Methylmalonic Acid, Quantitative: 119 nmol/L (ref 0–378)

## 2020-11-14 LAB — CREATININE, SERUM
Creatinine, Ser: 0.45 mg/dL (ref 0.44–1.00)
GFR, Estimated: >60 mL/min (ref >60)

## 2020-11-14 NOTE — Progress Notes (Signed)
Physical Therapy Treatment Patient Details Name: [name] MRN: [id number] DOB: Mar 31, 1946 Today's Date: 11/14/2020    History of Present Illness Per MD notes, pt is a 75 y.o. female who presented to the emergency department due to numbness and tingling of bilateral lower extremities and upper extremities with a past medical history of coronary artery disease, history of DVT after childbirth, hypertension, hyperlipidemia, chronic low back pain, ambulatory dysfunction. MD assessment includes ataxia and inability to ambulate, hypovolemic hyponatremia, and acute on chronic anemia.    PT Comments    Pt received supine in bed, agreeable to therapy. 76f of ambulation was performed x4 reps with fatigue and a seated rest break required after each rep. She became tearful when speaking about her decreased mobility status, need to transition to SNF prior to home and the need to use a RW. PT provided encouragement and stated she would be received increased therapy once at the SNF. PT also encouraged pt to continue using RW until she is strong enough to safely ambulate without, reminding pt RW will provide increased independence in the short term and the implications of a fall. Pt understands and agrees. Therex followed ambulation with fatigue after 10 reps of each. Pt continues to demo generalized weakness and endurance in BLE as well as decreased ambulation distance and standing stability. Would benefit from skilled PT to address above deficits and promote optimal return to PLOF.   Follow Up Recommendations  SNF     Equipment Recommendations  Other (comment) (TBD at next venue of care)    Recommendations for Other Services       Precautions / Restrictions Precautions Precautions: Fall    Mobility  Bed Mobility Overal bed mobility: Modified Independent Bed Mobility: Sit to Supine;Supine to Sit     Supine to sit: Modified independent (Device/Increase time) Sit to supine: Min assist;Modified  independent (Device/Increase time)   General bed mobility comments: increased time    Transfers Overall transfer level: Needs assistance Equipment used: Rolling walker (2 wheeled) Transfers: Sit to/from Stand Sit to Stand: Min guard         General transfer comment: CGA to steady once on feet; VC for hand placement  Ambulation/Gait Ambulation/Gait assistance: Min guard Gait Distance (Feet): 6 Feet Assistive device: Rolling walker (2 wheeled) Gait Pattern/deviations: Decreased step length - left;Decreased step length - right;Step-through pattern;Decreased stride length;Ataxic Gait velocity: decreased   General Gait Details: 636fx 4 reps, seated rest break between each. Slow cadence with short B step length, mildly ataxic. Increased steadying during turn to sit.   Stairs             Wheelchair Mobility    Modified Rankin (Stroke Patients Only)       Balance Overall balance assessment: Needs assistance Sitting-balance support: Feet supported;No upper extremity supported Sitting balance-Leahy Scale: Good Sitting balance - Comments: Supervision sitting on EOB   Standing balance support: Bilateral upper extremity supported;During functional activity Standing balance-Leahy Scale: Poor Standing balance comment: posterior LOB when transitioning to sitting and standing requring support to prevent fall as well as UE support continuously                            Cognition Arousal/Alertness: Awake/alert Behavior During Therapy: WFL for tasks assessed/performed Overall Cognitive Status: Within Functional Limits for tasks assessed  Exercises Total Joint Exercises Ankle Circles/Pumps: AROM;Both;10 reps;Supine Towel Squeeze: Both;10 reps;Strengthening (pillow used) Hip ABduction/ADduction: AROM;Both;10 reps;Strengthening;Seated (manual resistance) Long Arc Quad: Strengthening;Both;10 reps (with manual  resistance.) Knee Flexion: Strengthening;Both;10 reps (manual resistance) Marching in Standing: Strengthening;Both;10 reps;Seated Bridges: Strengthening;Both;Supine (to reposition in bed, 2 reps, partial ROM)    General Comments        Pertinent Vitals/Pain Pain Assessment: 0-10 Pain Score: 4  Pain Location: low back Pain Descriptors / Indicators: Aching Pain Intervention(s): Patient requesting pain meds-RN notified;Limited activity within patient's tolerance;Monitored during session    Home Living                      Prior Function            PT Goals (current goals can now be found in the care plan section) Acute Rehab PT Goals Patient Stated Goal: to walk    Frequency    Min 2X/week      PT Plan      Co-evaluation              AM-PAC PT "6 Clicks" Mobility   Outcome Measure  Help needed turning from your back to your side while in a flat bed without using bedrails?: None Help needed moving from lying on your back to sitting on the side of a flat bed without using bedrails?: A Little Help needed moving to and from a bed to a chair (including a wheelchair)?: A Little Help needed standing up from a chair using your arms (e.g., wheelchair or bedside chair)?: A Little Help needed to walk in hospital room?: A Little Help needed climbing 3-5 steps with a railing? : A Lot 6 Click Score: 18    End of Session Equipment Utilized During Treatment: Gait belt Activity Tolerance: Patient tolerated treatment well;Patient limited by fatigue;Patient limited by pain Patient left: with call bell/phone within reach;in bed;with bed alarm set   PT Visit Diagnosis: Unsteadiness on feet (R26.81);Muscle weakness (generalized) (M62.81);History of falling (Z91.81);Difficulty in walking, not elsewhere classified (R26.2);Pain     Time: [id number] PT Time Calculation (min) (ACUTE ONLY): 23 min  Charges:  $Gait Training: 8-22 mins $Therapeutic Exercise: 8-22 mins                      Patrina Levering PT, DPT 11/14/20 4:37 PM [id number]    Ramonita Lab 11/14/2020, 4:37 PM

## 2020-11-14 NOTE — Care Management Important Message (Signed)
Important Message  Patient Details  Name: [name] MRN: [id number] Date of Birth: [birth year]   Medicare Important Message Given:  Yes     [name] 11/14/2020, 10:22 AM

## 2020-11-14 NOTE — Progress Notes (Signed)
PROGRESS NOTE    [name]  [id number] DOB: 1945-08-18 DOA: 11/07/2020 PCP: Burnard Hawthorne, FNP   Brief Narrative:  75 y.o. female with a past medical history of coronary artery disease, history of DVT after childbirth, hypertension, hyperlipidemia, chronic low back pain, ambulatory dysfunction.  The patient presents to the emergency department due to numbness and tingling of bilateral lower extremities and upper extremities.  She has also been unable to ambulate at all for the past few days. Started on IV copper infusion after neurology recommendations.  Feels that her ataxia is slowly starting to improve.  Copper infusions to be completed 7/27.  Etiology of patient's copper deficiency is unclear.  Presentation is inconsistent with Wilson's disease.  Case discussed with GI.  Possible GI related etiologies of impaired copper absorption include celiac disease and vitamin malabsorption.  Further work-up in progress however patient can discharge to skilled nursing facility once insurance authorization is obtained.  If necessary can follow-up with gastroenterology as outpatient.  Patient is medically stable for discharge as of 7/28.  Excepted a bed at [facility].  Insurance authorization pending.   Assessment & Plan:   Active Problems:   Weakness   Ambulatory dysfunction   CNS demyelination (HCC)   Copper deficiency myeloneuropathy (HCC)   Numbness and tingling  Acute ataxia Decreased ability to ambulate Possibly secondary to copper deficiency Also possible contribution from [id number] folic acid deficiency CT imaging survey and MRI imaging survey negative Completed 5 days of copper infusion on 7/27 Plan: Continue IM B12 Oral supplement on discharge Continue folic acid replacement Continue therapy.  Plan for discharge to SNF Continue Neurontin 200 mg 3 times daily Check celiac panel and vitamin panel for further work-up of GI etiologies to mild absorption of copper, pending  at time of this note Will see GI in follow-up  Chronic back pain PTA oxycodone and Flexeril Gabapentin 200 mg p.o. 3 times daily  Acute on chronic iron deficiency and folate deficiency anemia Venofer [id number] mg IV daily Folic acid replacement as above Will convert to p.o. at time of discharge  Hypovolemic hyponatremia Likely secondary to dehydration Sodium improved No further need for IV fluids     DVT prophylaxis: SQ Lovenox Code Status: Full Family Communication: None today Disposition Plan: Status is: Inpatient  Remains inpatient appropriate because: Unsafe discharge plan  Dispo: The patient is from: Home              Anticipated d/c is to: SNF              Patient currently is medically stable for discharge   Difficult to place patient No  Patient accepted a bed offer at [facility].  Insurance authorization is pending.     Level of care: Med-Surg  Consultants:  Neurology  Procedures:  None  Antimicrobials:  None   Subjective: Patient seen and examined.  Sitting up in bed.  No visible distress.  Expresses some concern about going to skilled nursing facility and subsequent discharge and safety at home.  Objective: Vitals:   11/13/20 1954 11/14/20 0449 11/14/20 0735 11/14/20 1146  BP: 104/73 (!) 101/47 (!) 115/55 (!) 101/50  Pulse: 91 82 80 82  Resp: '19 18 16 16  '$ Temp: 98.3 F (36.8 C) 98.2 F (36.8 C) 97.8 F (36.6 C) 98.2 F (36.8 C)  TempSrc: Oral Oral    SpO2: 95% 93% 96% 98%  Weight:      Height:  Intake/Output Summary (Last 24 hours) at 11/14/2020 1327 Last data filed at 11/14/2020 1032 Gross per 24 hour  Intake 480 ml  Output 1200 ml  Net -720 ml   Filed Weights   11/07/20 1518 11/07/20 1600  Weight: 54.4 kg 50 kg    Examination:  General exam: No acute distress Respiratory system: Clear to auscultation. Respiratory effort normal. Cardiovascular system: S1-S2, regular rate and rhythm, no murmurs, no pedal edema   gastrointestinal system: Soft, nontender, nondistended normal bowel sounds Central nervous system: Alert, oriented x3, numbness noted bilateral hands and feet.  Gait not assessed  extremities: Symmetric 5 x 5 power. Skin: No rashes, lesions or ulcers Psychiatry: Judgement and insight appear normal. Mood & affect appropriate.     Data Reviewed: I have personally reviewed following labs and imaging studies  CBC: Recent Labs  Lab 11/09/20 0603 11/10/20 0536 11/11/20 0555 11/12/20 0452 11/13/20 0612  WBC 3.1* 3.6* 5.6 5.6 6.3  HGB 7.9* 8.1* 7.7* 7.6* 7.7*  HCT 25.5* 25.9* 25.3* 24.9* 25.2*  MCV 95.1 94.2 93.4 94.7 95.5  PLT 239 248 256 244 [id number]   Basic Metabolic Panel: Recent Labs  Lab 11/08/20 0834 11/09/20 0603 11/10/20 0536 11/11/20 0555 11/12/20 0452 11/13/20 0612 11/14/20 0406  NA  --  137 138 137 139 136  --   K  --  3.7 3.8 4.0 4.4 4.2  --   CL  --  106 108 107 106 100  --   CO2  --  '26 27 25 29 29  '$ --   GLUCOSE  --  81 76 80 98 94  --   BUN  --  7* '10 12 11 9  '$ --   CREATININE  --  0.44 0.47 0.61 0.57 0.44 0.45  CALCIUM  --  9.1 9.2 8.7* 8.9 9.5  --   MG 1.9 1.9  --   --   --   --   --   PHOS 2.7 3.1  --   --   --   --   --    GFR: Estimated Creatinine Clearance: 45.7 mL/min (by C-G formula based on SCr of 0.45 mg/dL). Liver Function Tests: Recent Labs  Lab 11/07/20 1522  AST 30  ALT 37  ALKPHOS 78  BILITOT 1.1  PROT 6.3*  ALBUMIN 3.7   Recent Labs  Lab 11/07/20 1522  LIPASE 37   No results for input(s): AMMONIA in the last 168 hours. Coagulation Profile: No results for input(s): INR, PROTIME in the last 168 hours. Cardiac Enzymes: No results for input(s): CKTOTAL, CKMB, CKMBINDEX, TROPONINI in the last 168 hours. BNP (last 3 results) No results for input(s): PROBNP in the last 8760 hours. HbA1C: No results for input(s): HGBA1C in the last 72 hours. CBG: Recent Labs  Lab 11/12/20 0723 11/12/20 1139 11/12/20 1532 11/12/20 2037  11/13/20 1210  GLUCAP 96 142* 128* 121* 137*   Lipid Profile: No results for input(s): CHOL, HDL, LDLCALC, TRIG, CHOLHDL, LDLDIRECT in the last 72 hours. Thyroid Function Tests: No results for input(s): TSH, T4TOTAL, FREET4, T3FREE, THYROIDAB in the last 72 hours. Anemia Panel: No results for input(s): VITAMINB12, FOLATE, FERRITIN, TIBC, IRON, RETICCTPCT in the last 72 hours.  Sepsis Labs: No results for input(s): PROCALCITON, LATICACIDVEN in the last 168 hours.  Recent Results (from the past 240 hour(s))  Resp Panel by RT-PCR (Flu A&B, Covid) Nasopharyngeal Swab     Status: None   Collection Time: 11/07/20  4:19 PM   Specimen: Nasopharyngeal  Swab; Nasopharyngeal(NP) swabs in vial transport medium  Result Value Ref Range Status   SARS Coronavirus 2 by RT PCR NEGATIVE NEGATIVE Final    Comment: (NOTE) SARS-CoV-2 target nucleic acids are NOT DETECTED.  The SARS-CoV-2 RNA is generally detectable in upper respiratory specimens during the acute phase of infection. The lowest concentration of SARS-CoV-2 viral copies this assay can detect is 138 copies/mL. A negative result does not preclude SARS-Cov-2 infection and should not be used as the sole basis for treatment or other patient management decisions. A negative result may occur with  improper specimen collection/handling, submission of specimen other than nasopharyngeal swab, presence of viral mutation(s) within the areas targeted by this assay, and inadequate number of viral copies(<138 copies/mL). A negative result must be combined with clinical observations, patient history, and epidemiological information. The expected result is Negative.  Fact Sheet for Patients:  EntrepreneurPulse.com.au  Fact Sheet for Healthcare Providers:  IncredibleEmployment.be  This test is no t yet approved or cleared by the [place] FDA and  has been authorized for detection and/or diagnosis of SARS-CoV-2  by FDA under an Emergency Use Authorization (EUA). This EUA will remain  in effect (meaning this test can be used) for the duration of the COVID-19 declaration under Section 564(b)(1) of the Act, 21 U.S.C.section 360bbb-3(b)(1), unless the authorization is terminated  or revoked sooner.       Influenza A by PCR NEGATIVE NEGATIVE Final   Influenza B by PCR NEGATIVE NEGATIVE Final    Comment: (NOTE) The Xpert Xpress SARS-CoV-2/FLU/RSV plus assay is intended as an aid in the diagnosis of influenza from Nasopharyngeal swab specimens and should not be used as a sole basis for treatment. Nasal washings and aspirates are unacceptable for Xpert Xpress SARS-CoV-2/FLU/RSV testing.  Fact Sheet for Patients: EntrepreneurPulse.com.au  Fact Sheet for Healthcare Providers: IncredibleEmployment.be  This test is not yet approved or cleared by the [place] FDA and has been authorized for detection and/or diagnosis of SARS-CoV-2 by FDA under an Emergency Use Authorization (EUA). This EUA will remain in effect (meaning this test can be used) for the duration of the COVID-19 declaration under Section 564(b)(1) of the Act, 21 U.S.C. section 360bbb-3(b)(1), unless the authorization is terminated or revoked.  Performed at [facility], [address] [place], [place] [address]   Urine Culture     Status: Abnormal   Collection Time: 11/07/20  4:19 PM   Specimen: Urine, Clean Catch  Result Value Ref Range Status   Specimen Description   Final    URINE, CLEAN CATCH Performed at [facility], [address] [place], [place] [address]    Special Requests   Final    NONE Performed at [facility], [address] [place], [place] [address]    Culture MULTIPLE SPECIES PRESENT, SUGGEST RECOLLECTION (A)  Final   Report Status 11/09/2020 FINAL  Final         Radiology Studies: No results found.      Scheduled Meds:   aspirin EC  81 mg Oral Daily   calcium carbonate  1 tablet Oral BID WC   cholecalciferol  1,000 Units Oral Daily   cyanocobalamin  1,000 mcg Intramuscular Daily   enoxaparin (LOVENOX) injection  40 mg Subcutaneous Q24H   ezetimibe  10 mg Oral Daily   feeding supplement  237 mL Oral BID BM   folic acid  5 mg Oral Daily   gabapentin  200 mg Oral TID   pantoprazole  40  mg Oral Daily   polyethylene glycol  17 g Oral Daily   rosuvastatin  10 mg Oral Daily   sertraline  100 mg Oral Daily   Continuous Infusions:  sodium chloride 75 mL/hr at 11/13/20 2323     LOS: 5 days    Time spent: 15 minutes    Sidney Ace, MD [facility] Pager [phone]  If 7PM-7AM, please contact night-coverage 11/14/2020, 1:27 PM

## 2020-11-14 NOTE — TOC Progression Note (Signed)
Transition of Care [facility]) - Progression Note    Patient Details  Name: [name] MRN: [id number] Date of Birth: Mar 08, 1946  Transition of [facility] CM/SW Contact  Su Hilt, RN Phone Number: 11/14/2020, 10:56 AM  Clinical Narrative:     Jeralene Huff out to [name] at [facility] is still pending       Expected Discharge Plan and Services                                                 Social Determinants of Health (SDOH) Interventions    Readmission Risk Interventions No flowsheet data found.

## 2020-11-15 DIAGNOSIS — E610 Copper deficiency: Secondary | ICD-10-CM | POA: Diagnosis not present

## 2020-11-15 DIAGNOSIS — R262 Difficulty in walking, not elsewhere classified: Secondary | ICD-10-CM | POA: Diagnosis not present

## 2020-11-15 DIAGNOSIS — G992 Myelopathy in diseases classified elsewhere: Secondary | ICD-10-CM | POA: Diagnosis not present

## 2020-11-15 DIAGNOSIS — G63 Polyneuropathy in diseases classified elsewhere: Secondary | ICD-10-CM | POA: Diagnosis not present

## 2020-11-15 LAB — VITAMIN B6: Vitamin B6: 15.7 ug/L (ref 3.4–65.2)

## 2020-11-15 LAB — VITAMIN C: Vitamin C: 1.3 mg/dL (ref 0.4–2.0)

## 2020-11-15 LAB — VITAMIN E
Vitamin E (Alpha Tocopherol): 8.1 mg/L — ABNORMAL LOW (ref 9.0–29.0)
Vitamin E(Gamma Tocopherol): 0.3 mg/L — ABNORMAL LOW (ref 0.5–4.9)

## 2020-11-15 LAB — VITAMIN B1: Vitamin B1 (Thiamine): 104.4 nmol/L (ref 66.5–200.0)

## 2020-11-15 NOTE — Progress Notes (Signed)
   11/15/20 1239  What Happened  Was fall witnessed? Yes  Who witnessed fall? Hanley Hays, OT  Patients activity before fall ambulating-assisted  Point of contact other (comment) (knees)  Was patient injured?  (skin tear right forearm)  Follow Up  MD notified Sreenath  Time MD notified 1152  Family notified No - patient refusal  Additional tests No  Simple treatment Dressing (right forearm skin tear)  Adult Fall Risk Assessment  Risk Factor Category (scoring not indicated) High fall risk per protocol (document High fall risk)  Patient Fall Risk Level High fall risk  Adult Fall Risk Interventions  Required Bundle Interventions *See Row Information* High fall risk - low, moderate, and high requirements implemented  Additional Interventions HeadStart bed sensor (education/return demonstration)  Screening for Fall Injury Risk (To be completed on HIGH fall risk patients) - Assessing Need for Floor Mats  Risk For Fall Injury- Criteria for Floor Mats None identified - No additional interventions needed  Will Implement Floor Mats Yes  Pain Assessment  Pain Scale 0-10  Pain Score 0  Neurological  Neuro (WDL) WDL  Level of Consciousness Alert  Orientation Level Oriented X4  Musculoskeletal  Musculoskeletal (WDL) X  Assistive Device Front wheel walker  Generalized Weakness Yes  Weight Bearing Restrictions No  Integumentary  Integumentary (WDL) X  Skin Color Appropriate for ethnicity  Skin Condition Dry  Skin Integrity Ecchymosis;Abrasion;Other (Comment) (skin tear)  Abrasion Location Leg  Abrasion Location Orientation Bilateral  Ecchymosis Location Leg;Arm  Ecchymosis Location Orientation Bilateral

## 2020-11-15 NOTE — Progress Notes (Signed)
Physical Therapy Treatment Patient Details Name: BRIANA GARRELTS MRN: [id number] DOB: 21-Nov-1945 Today's Date: 11/15/2020    History of Present Illness Per MD notes, pt is a 75 y.o. female who presented to the emergency department due to numbness and tingling of bilateral lower extremities and upper extremities with a past medical history of coronary artery disease, history of DVT after childbirth, hypertension, hyperlipidemia, chronic low back pain, ambulatory dysfunction. MD assessment includes ataxia and inability to ambulate, hypovolemic hyponatremia, and acute on chronic anemia.    PT Comments    Pt received supine in bed, alert and agreeable to therapy. Session focused on strengthening therapeutic interventions. Pt performed 2 sets of 10, bridges, SLR and hip abduction. PT assisted to steady at knee due to hip weakness. During session, pt had BM. PT assisted with pericare as pt performed rolling. Pt then transferred to the recliner via stand pivot using RW. Heavy CGA during standing and transfer due to unsteadiness. Would benefit from skilled PT to address above deficits and promote optimal return to PLOF.    Follow Up Recommendations  SNF     Equipment Recommendations  Other (comment) (TBD at next venue of care)    Recommendations for Other Services       Precautions / Restrictions Precautions Precautions: Fall Restrictions Weight Bearing Restrictions: No    Mobility  Bed Mobility Overal bed mobility: Modified Independent Bed Mobility: Sit to Supine;Supine to Sit;Rolling Rolling: Modified independent (Device/Increase time) (bilaterally for pericare clean up)   Supine to sit: Modified independent (Device/Increase time) (2 reps) Sit to supine: Modified independent (Device/Increase time)   General bed mobility comments: increased time    Transfers Overall transfer level: Needs assistance Equipment used: Rolling walker (2 wheeled) Transfers: Sit to/from Stand Sit to  Stand: Min guard Stand pivot transfers: Min guard       General transfer comment: CGA to steady once on feet; increased time with slow cautious movements  Ambulation/Gait Ambulation/Gait assistance: Min guard           General Gait Details: not performed today   Stairs             Wheelchair Mobility    Modified Rankin (Stroke Patients Only)       Balance Overall balance assessment: Needs assistance Sitting-balance support: Feet supported;No upper extremity supported Sitting balance-Leahy Scale: Good Sitting balance - Comments: Mod I sitting EOB with BLE supported   Standing balance support: Bilateral upper extremity supported;During functional activity Standing balance-Leahy Scale: Poor Standing balance comment: heavy CGA to maintain standing balance - static and during stand pivot transition                            Cognition Arousal/Alertness: Awake/alert Behavior During Therapy: WFL for tasks assessed/performed Overall Cognitive Status: Within Functional Limits for tasks assessed                                        Exercises Total Joint Exercises Towel Squeeze:  (pillow used) Hip ABduction/ADduction: Both;Strengthening;Supine;20 reps (clamshells with manual resistance, 2x10. Abduction only) Straight Leg Raises: Both;Supine;Strengthening;20 reps (2x10 each LE) Long Arc Quad: Strengthening;Both;10 reps (with manual resistance.) Knee Flexion: Strengthening;Both;10 reps (manual resistance) Marching in Standing: Strengthening;Both;10 reps;Seated Bridges: Strengthening;Both;Supine;20 reps (2x10; assist to stabilize hips/knees) Other Exercises Other Exercises: BM noticed after pt sat up to EOB. Pt returned to  supine and performed rolling to assist PT with clean up. Once clean, pt sat up to EOB and transfered EOB>recliner stand pivot using RW. Pt fatigued once in chair so additional exercises were not performed.    General  Comments        Pertinent Vitals/Pain Pain Assessment: No/denies pain    Home Living                      Prior Function            PT Goals (current goals can now be found in the care plan section) Acute Rehab PT Goals Patient Stated Goal: to walk PT Goal Formulation: With patient Time For Goal Achievement: 11/21/20 Potential to Achieve Goals: Good    Frequency    Min 2X/week      PT Plan      Co-evaluation              AM-PAC PT "6 Clicks" Mobility   Outcome Measure  Help needed turning from your back to your side while in a flat bed without using bedrails?: None Help needed moving from lying on your back to sitting on the side of a flat bed without using bedrails?: A Little Help needed moving to and from a bed to a chair (including a wheelchair)?: A Little Help needed standing up from a chair using your arms (e.g., wheelchair or bedside chair)?: A Little Help needed to walk in hospital room?: A Little Help needed climbing 3-5 steps with a railing? : A Lot 6 Click Score: 18    End of Session Equipment Utilized During Treatment: Gait belt Activity Tolerance: Patient tolerated treatment well;Patient limited by fatigue Patient left: in chair;with call bell/phone within reach;with chair alarm set Nurse Communication: Mobility status;Other (comment) (urine canister needed to be emptied) PT Visit Diagnosis: Unsteadiness on feet (R26.81);Muscle weakness (generalized) (M62.81);History of falling (Z91.81);Difficulty in walking, not elsewhere classified (R26.2);Pain     Time: [id number] PT Time Calculation (min) (ACUTE ONLY): 34 min  Charges:  $Therapeutic Exercise: 8-22 mins $Therapeutic Activity: 8-22 mins                     Patrina Levering PT, DPT 11/15/20 1:05 PM [id number]    Ramonita Lab 11/15/2020, 1:01 PM

## 2020-11-15 NOTE — Progress Notes (Addendum)
Nutrition Follow-up  DOCUMENTATION CODES:  Not applicable  INTERVENTION:  Continue current diet as ordered, encouraged PO intake Ensure Enlive po BID, each supplement provides 350 kcal and 20 grams of protein  NUTRITION DIAGNOSIS:  Inadequate oral intake related to poor appetite as evidenced by meal completion < 50%, per patient/family report.  GOAL:  Patient will meet greater than or equal to 90% of their needs  MONITOR:  PO intake, Supplement acceptance  REASON FOR ASSESSMENT:  Malnutrition Screening Tool    ASSESSMENT:  75 y.o. female presented to ED with concerns for weakness and loss of appetite. Reports numbness and tingling to upper and lower extremities and that she has also been unable to ambulate for the past few days. PMH relevant for CAD (s/p CABGx3), HTN, HLD, PVD, GERD, Hx MI   Pt found to have copper deficiency as the source of her neuropathy after neurology evaluation. Repleted and mobility is much improved.   Pt resting in chair at the time of assessment. Reports that appetite is at baseline, eats better some days than others. Reports weight loss from 134 lb to 117 lb in 4 days PTA. Will request new measured weight to obtained to assess current weight. Inquired about nutrition supplements. States that she likes the chocolate flavor best but does not like strawberry at all. Will add preferences.   Average Meal Intake: 7/21-7/22: 50% intake x 1 recorded meals 7/23-7/29: 63% intake x 9 recorded meals (30-100%)  Nutritionally Relevant Medications: Scheduled Meds:  calcium carbonate  1 tablet Oral BID WC   cholecalciferol  1,000 Units Oral Daily   cyanocobalamin  1,000 mcg Intramuscular Daily   ezetimibe  10 mg Oral Daily   feeding supplement  237 mL Oral BID BM   folic acid  5 mg Oral Daily   pantoprazole  40 mg Oral Daily   polyethylene glycol  17 g Oral Daily   rosuvastatin  10 mg Oral Daily   Continuous Infusions:  sodium chloride 75 mL/hr at 11/15/20  0240   PRN Meds: bisacodyl  Labs Reviewed  NUTRITION - FOCUSED PHYSICAL EXAM: Flowsheet Row Most Recent Value  Orbital Region No depletion  Upper Arm Region No depletion  Thoracic and Lumbar Region No depletion  Buccal Region No depletion  Temple Region No depletion  Clavicle Bone Region No depletion  Clavicle and Acromion Bone Region Mild depletion  Scapular Bone Region Mild depletion  Dorsal Hand No depletion  Patellar Region Mild depletion  Anterior Thigh Region Mild depletion  Posterior Calf Region No depletion  Edema (RD Assessment) None  Hair Reviewed  Eyes Reviewed  Mouth Reviewed  Skin Reviewed  Nails Reviewed   Diet Order:   Diet Order             Diet regular Room service appropriate? Yes; Fluid consistency: Thin  Diet effective now                   EDUCATION NEEDS:  No education needs have been identified at this time  Skin:  Skin Assessment: Reviewed RN Assessment  Last BM:  7/27 - type 5  Height:  Ht Readings from Last 1 Encounters:  11/07/20 5' (1.524 m)    Weight:  Wt Readings from Last 1 Encounters:  11/07/20 50 kg    Ideal Body Weight:  45.5 kg  BMI:  Body mass index is 21.53 kg/m.  Estimated Nutritional Needs:  Kcal:  1400-1600 kcal/d Protein:  75-85 g/d Fluid:  >1500 mL/d  Ranell Patrick, RD, LDN Clinical Dietitian Pager on [place]

## 2020-11-15 NOTE — TOC Progression Note (Signed)
Transition of Care [facility]) - Progression Note    Patient Details  Name: [name] MRN: [id number] Date of Birth: 11/01/1945  Transition of Care [facility]) CM/SW [place], RN Phone Number: 11/15/2020, 9:45 AM  Clinical Narrative:     Jeralene Huff out to Hilda Blades at [facility] requesting an update on the insuracne auth for [facility] SNF, awaiting a responce       Expected Discharge Plan and Services                                                 Social Determinants of Health (SDOH) Interventions    Readmission Risk Interventions No flowsheet data found.

## 2020-11-15 NOTE — Progress Notes (Signed)
PROGRESS NOTE    [name]  [id number] DOB: 02/01/1946 DOA: 11/07/2020 PCP: Burnard Hawthorne, FNP   Brief Narrative:  75 y.o. female with a past medical history of coronary artery disease, history of DVT after childbirth, hypertension, hyperlipidemia, chronic low back pain, ambulatory dysfunction.  The patient presents to the emergency department due to numbness and tingling of bilateral lower extremities and upper extremities.  She has also been unable to ambulate at all for the past few days. Started on IV copper infusion after neurology recommendations.  Feels that her ataxia is slowly starting to improve.  Copper infusions to be completed 7/27.  Etiology of patient's copper deficiency is unclear.  Presentation is inconsistent with Wilson's disease.  Case discussed with GI.  Possible GI related etiologies of impaired copper absorption include celiac disease and vitamin malabsorption.  Further work-up in progress however patient can discharge to skilled nursing facility once insurance authorization is obtained.  If necessary can follow-up with gastroenterology as outpatient.  Patient is medically stable for discharge as of 7/28.  Excepted a bed at [facility]  Insurance authorization pending.   Assessment & Plan:   Active Problems:   Weakness   Ambulatory dysfunction   CNS demyelination (HCC)   Copper deficiency myeloneuropathy (HCC)   Numbness and tingling  Acute ataxia Decreased ability to ambulate Possibly secondary to copper deficiency Also possible contribution from [id number] folic acid deficiency CT imaging survey and MRI imaging survey negative Completed 5 days of copper infusion on 7/27 Plan: Continue IM B12 Oral supplement on discharge Continue folic acid replacement Continue therapy.  Plan for discharge to SNF Continue Neurontin 200 mg 3 times daily Celiac and vitamin panel pending.  Follow-up results Outpatient follow-up with GI  Chronic back pain PTA  oxycodone and Flexeril Gabapentin 200 mg p.o. 3 times daily  Acute on chronic iron deficiency and folate deficiency anemia Venofer [id number] mg IV daily Folic acid replacement as above Will convert to p.o. at time of discharge  Hypovolemic hyponatremia Likely secondary to dehydration Sodium improved No further need for IV fluids     DVT prophylaxis: SQ Lovenox Code Status: Full Family Communication: None today Disposition Plan: Status is: Inpatient  Remains inpatient appropriate because: Unsafe discharge plan  Dispo: The patient is from: Home              Anticipated d/c is to: SNF              Patient currently is medically stable for discharge   Difficult to place patient No  Patient accepted a bed offer at [facility].  Insurance authorization is pending.     Level of care: Med-Surg  Consultants:  Neurology  Procedures:  None  Antimicrobials:  None   Subjective: Patient seen and examined.  Resting comfortably in bed.  No visible distress.  Objective: Vitals:   11/15/20 0121 11/15/20 0441 11/15/20 0804 11/15/20 1150  BP: (!) 95/48 (!) 98/48 (!) 103/46 122/65  Pulse: 91 87 86 92  Resp: '15 16 15 18  '$ Temp: 98.6 F (37 C) 98.6 F (37 C) 98.5 F (36.9 C) 98.4 F (36.9 C)  TempSrc:    Oral  SpO2: 93% 93% 94% 95%  Weight:      Height:        Intake/Output Summary (Last 24 hours) at 11/15/2020 1340 Last data filed at 11/15/2020 0700 Gross per 24 hour  Intake 1139.95 ml  Output 1000 ml  Net 139.95 ml  Filed Weights   11/07/20 1518 11/07/20 1600  Weight: 54.4 kg 50 kg    Examination:  General exam: No acute distress Respiratory system: Clear to auscultation. Respiratory effort normal. Cardiovascular system: S1-S2, regular rate and rhythm, no murmurs, no pedal edema  gastrointestinal system: Soft, nontender, nondistended normal bowel sounds Central nervous system: Alert, oriented x3, numbness noted bilateral hands and feet.  Gait not assessed   extremities: Symmetric 5 x 5 power. Skin: No rashes, lesions or ulcers Psychiatry: Judgement and insight appear normal. Mood & affect appropriate.     Data Reviewed: I have personally reviewed following labs and imaging studies  CBC: Recent Labs  Lab 11/09/20 0603 11/10/20 0536 11/11/20 0555 11/12/20 0452 11/13/20 0612  WBC 3.1* 3.6* 5.6 5.6 6.3  HGB 7.9* 8.1* 7.7* 7.6* 7.7*  HCT 25.5* 25.9* 25.3* 24.9* 25.2*  MCV 95.1 94.2 93.4 94.7 95.5  PLT 239 248 256 244 [id number]   Basic Metabolic Panel: Recent Labs  Lab 11/09/20 0603 11/10/20 0536 11/11/20 0555 11/12/20 0452 11/13/20 0612 11/14/20 0406  NA 137 138 137 139 136  --   K 3.7 3.8 4.0 4.4 4.2  --   CL 106 108 107 106 100  --   CO2 '26 27 25 29 29  '$ --   GLUCOSE 81 76 80 98 94  --   BUN 7* '10 12 11 9  '$ --   CREATININE 0.44 0.47 0.61 0.57 0.44 0.45  CALCIUM 9.1 9.2 8.7* 8.9 9.5  --   MG 1.9  --   --   --   --   --   PHOS 3.1  --   --   --   --   --    GFR: Estimated Creatinine Clearance: 45.7 mL/min (by C-G formula based on SCr of 0.45 mg/dL). Liver Function Tests: No results for input(s): AST, ALT, ALKPHOS, BILITOT, PROT, ALBUMIN in the last 168 hours.  No results for input(s): LIPASE, AMYLASE in the last 168 hours.  No results for input(s): AMMONIA in the last 168 hours. Coagulation Profile: No results for input(s): INR, PROTIME in the last 168 hours. Cardiac Enzymes: No results for input(s): CKTOTAL, CKMB, CKMBINDEX, TROPONINI in the last 168 hours. BNP (last 3 results) No results for input(s): PROBNP in the last 8760 hours. HbA1C: No results for input(s): HGBA1C in the last 72 hours. CBG: Recent Labs  Lab 11/12/20 0723 11/12/20 1139 11/12/20 1532 11/12/20 2037 11/13/20 1210  GLUCAP 96 142* 128* 121* 137*   Lipid Profile: No results for input(s): CHOL, HDL, LDLCALC, TRIG, CHOLHDL, LDLDIRECT in the last 72 hours. Thyroid Function Tests: No results for input(s): TSH, T4TOTAL, FREET4, T3FREE, THYROIDAB  in the last 72 hours. Anemia Panel: No results for input(s): VITAMINB12, FOLATE, FERRITIN, TIBC, IRON, RETICCTPCT in the last 72 hours.  Sepsis Labs: No results for input(s): PROCALCITON, LATICACIDVEN in the last 168 hours.  Recent Results (from the past 240 hour(s))  Resp Panel by RT-PCR (Flu A&B, Covid) Nasopharyngeal Swab     Status: None   Collection Time: 11/07/20  4:19 PM   Specimen: Nasopharyngeal Swab; Nasopharyngeal(NP) swabs in vial transport medium  Result Value Ref Range Status   SARS Coronavirus 2 by RT PCR NEGATIVE NEGATIVE Final    Comment: (NOTE) SARS-CoV-2 target nucleic acids are NOT DETECTED.  The SARS-CoV-2 RNA is generally detectable in upper respiratory specimens during the acute phase of infection. The lowest concentration of SARS-CoV-2 viral copies this assay can detect is 138 copies/mL. A negative  result does not preclude SARS-Cov-2 infection and should not be used as the sole basis for treatment or other patient management decisions. A negative result may occur with  improper specimen collection/handling, submission of specimen other than nasopharyngeal swab, presence of viral mutation(s) within the areas targeted by this assay, and inadequate number of viral copies(<138 copies/mL). A negative result must be combined with clinical observations, patient history, and epidemiological information. The expected result is Negative.  Fact Sheet for Patients:  EntrepreneurPulse.com.au  Fact Sheet for Healthcare Providers:  IncredibleEmployment.be  This test is no t yet approved or cleared by the [place] FDA and  has been authorized for detection and/or diagnosis of SARS-CoV-2 by FDA under an Emergency Use Authorization (EUA). This EUA will remain  in effect (meaning this test can be used) for the duration of the COVID-19 declaration under Section 564(b)(1) of the Act, 21 U.S.C.section 360bbb-3(b)(1), unless the  authorization is terminated  or revoked sooner.       Influenza A by PCR NEGATIVE NEGATIVE Final   Influenza B by PCR NEGATIVE NEGATIVE Final    Comment: (NOTE) The Xpert Xpress SARS-CoV-2/FLU/RSV plus assay is intended as an aid in the diagnosis of influenza from Nasopharyngeal swab specimens and should not be used as a sole basis for treatment. Nasal washings and aspirates are unacceptable for Xpert Xpress SARS-CoV-2/FLU/RSV testing.  Fact Sheet for Patients: EntrepreneurPulse.com.au  Fact Sheet for Healthcare Providers: IncredibleEmployment.be  This test is not yet approved or cleared by the [place] FDA and has been authorized for detection and/or diagnosis of SARS-CoV-2 by FDA under an Emergency Use Authorization (EUA). This EUA will remain in effect (meaning this test can be used) for the duration of the COVID-19 declaration under Section 564(b)(1) of the Act, 21 U.S.C. section 360bbb-3(b)(1), unless the authorization is terminated or revoked.  Performed at [facility], [address] [place], [place] [address]   Urine Culture     Status: Abnormal   Collection Time: 11/07/20  4:19 PM   Specimen: Urine, Clean Catch  Result Value Ref Range Status   Specimen Description   Final    URINE, CLEAN CATCH Performed at [facility], [address] [place],  [address]    Special Requests   Final    NONE Performed at [facility], [address] [place],  [address]    Culture MULTIPLE SPECIES PRESENT, SUGGEST RECOLLECTION (A)  Final   Report Status 11/09/2020 FINAL  Final         Radiology Studies: No results found.      Scheduled Meds:  aspirin EC  81 mg Oral Daily   calcium carbonate  1 tablet Oral BID WC   cholecalciferol  1,000 Units Oral Daily   cyanocobalamin  1,000 mcg Intramuscular Daily   enoxaparin (LOVENOX) injection  40 mg Subcutaneous Q24H   ezetimibe  10 mg  Oral Daily   feeding supplement  237 mL Oral BID BM   folic acid  5 mg Oral Daily   gabapentin  200 mg Oral TID   pantoprazole  40 mg Oral Daily   polyethylene glycol  17 g Oral Daily   rosuvastatin  10 mg Oral Daily   sertraline  100 mg Oral Daily   Continuous Infusions:  sodium chloride 75 mL/hr at 11/15/20 0240     LOS: 6 days    Time spent: 15 minutes    Sidney Ace, MD [facility] Pager [phone]  If 7PM-7AM,  please contact night-coverage 11/15/2020, 1:40 PM

## 2020-11-15 NOTE — Progress Notes (Signed)
Occupational Therapy Treatment Patient Details Name: [name] MRN: [id number] DOB: 13-May-1945 Today's Date: 11/15/2020    History of present illness Per MD notes, pt is a 75 y.o. female who presented to the emergency department due to numbness and tingling of bilateral lower extremities and upper extremities with a past medical history of coronary artery disease, history of DVT after childbirth, hypertension, hyperlipidemia, chronic low back pain, ambulatory dysfunction. MD assessment includes ataxia and inability to ambulate, hypovolemic hyponatremia, and acute on chronic anemia.   OT comments  Pt seen for OT tx. Seated in recliner, agreeable to OT. Pt stood with CGA + RW from recliner. CGA in standing, set up, elevated tray table, pt able to prepare toothbrush with no UE support in standing. Pt used RUE on RW for support to brush her teeth. BUE support on RW to lean forward and spit into a basin on her tray after brushing her teeth. Slight LOB noted with the effort requiring VC and continued CGA to correct. OT requested pt to sit for remaining grooming tasks. Pt then leaned forward again for spit and had a 2nd LOB. OT strongly requested pt return to sitting. Pt began to sit down on recliner but was too far forward and unable to push herself back, requiring assist from OT to gently lower to the floor. Once OT assisted pt safely back in the recliner, RN called promptly to assess. Pt noted with small skin tear on R forearm which RN placed small bandage on. Pt denied additional concerns and reported feeling comfortable staying up in the recliner.   Pt continues to demonstrate impairments that result in decreased ability to perform ADL/mobility safely and requiring increased assist. Continue to recommend short term rehab to maximize return to PLOF, minimize falls risk, and minimize caregiver burden.   Follow Up Recommendations  SNF    Equipment Recommendations  3 in 1 bedside commode     Recommendations for Other Services      Precautions / Restrictions Precautions Precautions: Fall Restrictions Weight Bearing Restrictions: No       Mobility Bed Mobility    General bed mobility comments: NT, up in recliner    Transfers Overall transfer level: Needs assistance Equipment used: Rolling walker (2 wheeled) Transfers: Sit to/from Stand Sit to Stand: Min guard            Balance Overall balance assessment: Needs assistance Sitting-balance support: Feet supported;No upper extremity supported Sitting balance-Leahy Scale: Good Sitting balance - Comments: Mod I sitting EOB with BLE supported   Standing balance support: Bilateral upper extremity supported;During functional activity;Single extremity supported Standing balance-Leahy Scale: Poor Standing balance comment: 2 slight LOB requiring heavy CGA                           ADL either performed or assessed with clinical judgement   ADL Overall ADL's : Needs assistance/impaired     Grooming: Oral care;Standing;Applying deodorant;Sitting;Brushing hair;Set up;Min guard Grooming Details (indicate cue type and reason): Pt stood inside RW with recliner behind her, elevated tray table in front of her for standing oral care. CGA for balance. When she attempted to lean forward to spit, pt noted with slight LOB requiring VC and continued CGA to correct. OT requested pt return to sitting to complete. Pt then leaned forward to spit once more prior to sitting and noted with 2nd slight LOB. OT strongly requested pt then sit back down. As pt began to sat,  she was too far forward and unable to push herself back in the chair, requiring assist from OT to gently come to the floor. Assist from OT to sit back up in the chair. Once safely back in the recliner, RN notified and came to assess pt.                                     Vision       Perception     Praxis      Cognition  Arousal/Alertness: Awake/alert Behavior During Therapy: WFL for tasks assessed/performed Overall Cognitive Status: Within Functional Limits for tasks assessed                                 General Comments: cues for safety        Exercises    Shoulder Instructions       General Comments      Pertinent Vitals/ Pain       Pain Assessment: No/denies pain  Home Living                                          Prior Functioning/Environment              Frequency  Min 2X/week        Progress Toward Goals  OT Goals(current goals can now be found in the care plan section)  Progress towards OT goals: OT to reassess next treatment  Acute Rehab OT Goals Patient Stated Goal: to walk OT Goal Formulation: With patient Time For Goal Achievement: 11/22/20 Potential to Achieve Goals: Good  Plan Discharge plan remains appropriate;Frequency remains appropriate    Co-evaluation                 AM-PAC OT "6 Clicks" Daily Activity     Outcome Measure   Help from another person eating meals?: None Help from another person taking care of personal grooming?: A Little Help from another person toileting, which includes using toliet, bedpan, or urinal?: A Little Help from another person bathing (including washing, rinsing, drying)?: A Little Help from another person to put on and taking off regular upper body clothing?: None Help from another person to put on and taking off regular lower body clothing?: A Little 6 Click Score: 20    End of Session Equipment Utilized During Treatment: Rolling walker  OT Visit Diagnosis: Other abnormalities of gait and mobility (R26.89);Muscle weakness (generalized) (M62.81)   Activity Tolerance Patient tolerated treatment well;Other (comment) (skin tear from assisted fall, RN notified, safety zone portal completed)   Patient Left in chair;with call bell/phone within reach;with chair alarm set;with  nursing/sitter in room   Nurse Communication Other (comment) (assisted fall, small skin tear R forearm)        Time: [id number] OT Time Calculation (min): 17 min  Charges: OT General Charges $OT Visit: 1 Visit OT Treatments $Self Care/Home Management : 8-22 mins  Hanley Hays, MPH, MS, OTR/L ascom [phone] 11/15/20, 1:43 PM

## 2020-11-16 DIAGNOSIS — G63 Polyneuropathy in diseases classified elsewhere: Secondary | ICD-10-CM | POA: Diagnosis not present

## 2020-11-16 DIAGNOSIS — E610 Copper deficiency: Secondary | ICD-10-CM | POA: Diagnosis not present

## 2020-11-16 DIAGNOSIS — R262 Difficulty in walking, not elsewhere classified: Secondary | ICD-10-CM | POA: Diagnosis not present

## 2020-11-16 DIAGNOSIS — G992 Myelopathy in diseases classified elsewhere: Secondary | ICD-10-CM | POA: Diagnosis not present

## 2020-11-16 LAB — CELIAC DISEASE PANEL
Endomysial Ab, IgA: NEGATIVE
IgA: 86 mg/dL (ref 64–422)
Tissue Transglutaminase Ab, IgA: <2 U/mL (ref 0–3)

## 2020-11-16 NOTE — Progress Notes (Signed)
PROGRESS NOTE    [name]  [id number] DOB: [birth year] DOA: 11/07/2020 PCP: Burnard Hawthorne, FNP   Brief Narrative:  75 y.o. female with a past medical history of coronary artery disease, history of DVT after childbirth, hypertension, hyperlipidemia, chronic low back pain, ambulatory dysfunction.  The patient presents to the emergency department due to numbness and tingling of bilateral lower extremities and upper extremities.  She has also been unable to ambulate at all for the past few days. Started on IV copper infusion after neurology recommendations.  Feels that her ataxia is slowly starting to improve.  Copper infusions to be completed 7/27.  Etiology of patient's copper deficiency is unclear.  Presentation is inconsistent with Wilson's disease.  Case discussed with GI.  Possible GI related etiologies of impaired copper absorption include celiac disease and vitamin malabsorption.  Further work-up in progress however patient can discharge to skilled nursing facility once insurance authorization is obtained.  If necessary can follow-up with gastroenterology as outpatient.  Patient is medically stable for discharge as of 7/28.  Excepted a bed at [facility].  Insurance authorization pending.   Assessment & Plan:   Active Problems:   Weakness   Ambulatory dysfunction   CNS demyelination (HCC)   Copper deficiency myeloneuropathy (HCC)   Numbness and tingling  Acute ataxia Decreased ability to ambulate Possibly secondary to copper deficiency Also possible contribution from [id number] folic acid deficiency CT imaging survey and MRI imaging survey negative Completed 5 days of copper infusion on 7/27 Vitamin B1, B6, C all in reference range Plan: Continue IM B12 Oral supplement on discharge Continue folic acid replacement Continue therapy.  Plan for discharge to SNF Continue Neurontin 200 mg 3 times daily Celiac disease panel pending Outpatient follow-up with GI  Chronic  back pain PTA oxycodone and Flexeril Gabapentin 200 mg p.o. 3 times daily  Acute on chronic iron deficiency and folate deficiency anemia Venofer [id number] mg IV daily Folic acid replacement as above Will convert to p.o. at time of discharge  Hypovolemic hyponatremia Likely secondary to dehydration Sodium improved No further need for IV fluids     DVT prophylaxis: SQ Lovenox Code Status: Full Family Communication: None today Disposition Plan: Status is: Inpatient  Remains inpatient appropriate because: Unsafe discharge plan  Dispo: The patient is from: Home              Anticipated d/c is to: SNF              Patient currently is medically stable for discharge   Difficult to place patient No  Patient accepted a bed offer at [facility] [place].  Insurance authorization is pending.     Level of care: Med-Surg  Consultants:  Neurology  Procedures:  None  Antimicrobials:  None   Subjective: Patient seen and examined.  Resting comfortably in bed.  No visible distress.  Objective: Vitals:   11/15/20 1531 11/15/20 2101 11/16/20 0531 11/16/20 0830  BP: (!) 101/54 (!) 102/51 (!) 99/50 121/63  Pulse: (!) 101 97 89 97  Resp: '16 16 16 15  '$ Temp: [id number] F (36.9 C) 98.6 F (37 C) 98.5 F (36.9 C) 98.2 F (36.8 C)  TempSrc: Oral Oral Oral   SpO2: 97% 97% 94% 90%  Weight:      Height:        Intake/Output Summary (Last 24 hours) at 11/16/2020 1322 Last data filed at 11/16/2020 1028 Gross per 24 hour  Intake 1941.15 ml  Output 1600 ml  Net 341.15 ml   Filed Weights   11/07/20 1518 11/07/20 1600  Weight: 54.4 kg 50 kg    Examination:  General exam: No acute distress Respiratory system: Clear to auscultation. Respiratory effort normal. Cardiovascular system: S1-S2, regular rate and rhythm, no murmurs, no pedal edema  gastrointestinal system: Soft, nontender, nondistended normal bowel sounds Central nervous system: Alert, oriented x3, numbness noted bilateral hands  and feet.  Gait not assessed  extremities: Symmetric 5 x 5 power. Skin: No rashes, lesions or ulcers Psychiatry: Judgement and insight appear normal. Mood & affect appropriate.     Data Reviewed: I have personally reviewed following labs and imaging studies  CBC: Recent Labs  Lab 11/10/20 0536 11/11/20 0555 11/12/20 0452 11/13/20 0612  WBC 3.6* 5.6 5.6 6.3  HGB 8.1* 7.7* 7.6* 7.7*  HCT 25.9* 25.3* 24.9* 25.2*  MCV 94.2 93.4 94.7 95.5  PLT 248 256 244 [id number]   Basic Metabolic Panel: Recent Labs  Lab 11/10/20 0536 11/11/20 0555 11/12/20 0452 11/13/20 0612 11/14/20 0406  NA 138 137 139 136  --   K 3.8 4.0 4.4 4.2  --   CL 108 107 106 100  --   CO2 '27 25 29 29  '$ --   GLUCOSE 76 80 98 94  --   BUN '10 12 11 9  '$ --   CREATININE 0.47 0.61 0.57 0.44 0.45  CALCIUM 9.2 8.7* 8.9 9.5  --    GFR: Estimated Creatinine Clearance: 45.7 mL/min (by C-G formula based on SCr of 0.45 mg/dL). Liver Function Tests: No results for input(s): AST, ALT, ALKPHOS, BILITOT, PROT, ALBUMIN in the last 168 hours.  No results for input(s): LIPASE, AMYLASE in the last 168 hours.  No results for input(s): AMMONIA in the last 168 hours. Coagulation Profile: No results for input(s): INR, PROTIME in the last 168 hours. Cardiac Enzymes: No results for input(s): CKTOTAL, CKMB, CKMBINDEX, TROPONINI in the last 168 hours. BNP (last 3 results) No results for input(s): PROBNP in the last 8760 hours. HbA1C: No results for input(s): HGBA1C in the last 72 hours. CBG: Recent Labs  Lab 11/12/20 0723 11/12/20 1139 11/12/20 1532 11/12/20 2037 11/13/20 1210  GLUCAP 96 142* 128* 121* 137*   Lipid Profile: No results for input(s): CHOL, HDL, LDLCALC, TRIG, CHOLHDL, LDLDIRECT in the last 72 hours. Thyroid Function Tests: No results for input(s): TSH, T4TOTAL, FREET4, T3FREE, THYROIDAB in the last 72 hours. Anemia Panel: No results for input(s): VITAMINB12, FOLATE, FERRITIN, TIBC, IRON, RETICCTPCT in the last  72 hours.  Sepsis Labs: No results for input(s): PROCALCITON, LATICACIDVEN in the last 168 hours.  Recent Results (from the past 240 hour(s))  Resp Panel by RT-PCR (Flu A&B, Covid) Nasopharyngeal Swab     Status: None   Collection Time: 11/07/20  4:19 PM   Specimen: Nasopharyngeal Swab; Nasopharyngeal(NP) swabs in vial transport medium  Result Value Ref Range Status   SARS Coronavirus 2 by RT PCR NEGATIVE NEGATIVE Final    Comment: (NOTE) SARS-CoV-2 target nucleic acids are NOT DETECTED.  The SARS-CoV-2 RNA is generally detectable in upper respiratory specimens during the acute phase of infection. The lowest concentration of SARS-CoV-2 viral copies this assay can detect is 138 copies/mL. A negative result does not preclude SARS-Cov-2 infection and should not be used as the sole basis for treatment or other patient management decisions. A negative result may occur with  improper specimen collection/handling, submission of specimen other than nasopharyngeal swab, presence of viral mutation(s) within the areas targeted by  this assay, and inadequate number of viral copies(<138 copies/mL). A negative result must be combined with clinical observations, patient history, and epidemiological information. The expected result is Negative.  Fact Sheet for Patients:  EntrepreneurPulse.com.au  Fact Sheet for Healthcare Providers:  IncredibleEmployment.be  This test is no t yet approved or cleared by the [place] FDA and  has been authorized for detection and/or diagnosis of SARS-CoV-2 by FDA under an Emergency Use Authorization (EUA). This EUA will remain  in effect (meaning this test can be used) for the duration of the COVID-19 declaration under Section 564(b)(1) of the Act, 21 U.S.C.section 360bbb-3(b)(1), unless the authorization is terminated  or revoked sooner.       Influenza A by PCR NEGATIVE NEGATIVE Final   Influenza B by PCR NEGATIVE  NEGATIVE Final    Comment: (NOTE) The Xpert Xpress SARS-CoV-2/FLU/RSV plus assay is intended as an aid in the diagnosis of influenza from Nasopharyngeal swab specimens and should not be used as a sole basis for treatment. Nasal washings and aspirates are unacceptable for Xpert Xpress SARS-CoV-2/FLU/RSV testing.  Fact Sheet for Patients: EntrepreneurPulse.com.au  Fact Sheet for Healthcare Providers: IncredibleEmployment.be  This test is not yet approved or cleared by the [place] FDA and has been authorized for detection and/or diagnosis of SARS-CoV-2 by FDA under an Emergency Use Authorization (EUA). This EUA will remain in effect (meaning this test can be used) for the duration of the COVID-19 declaration under Section 564(b)(1) of the Act, 21 U.S.C. section 360bbb-3(b)(1), unless the authorization is terminated or revoked.  Performed at [facility], [address] [place], [place] [address]   Urine Culture     Status: Abnormal   Collection Time: 11/07/20  4:19 PM   Specimen: Urine, Clean Catch  Result Value Ref Range Status   Specimen Description   Final    URINE, CLEAN CATCH Performed at [facility], [address], [place], [place] [address]    Special Requests   Final    NONE Performed at [facility], [address] [place], [place] [address]    Culture MULTIPLE SPECIES PRESENT, SUGGEST RECOLLECTION (A)  Final   Report Status 11/09/2020 FINAL  Final         Radiology Studies: No results found.      Scheduled Meds:  aspirin EC  81 mg Oral Daily   calcium carbonate  1 tablet Oral BID WC   cholecalciferol  1,000 Units Oral Daily   cyanocobalamin  1,000 mcg Intramuscular Daily   enoxaparin (LOVENOX) injection  40 mg Subcutaneous Q24H   ezetimibe  10 mg Oral Daily   feeding supplement  237 mL Oral BID BM   folic acid  5 mg Oral Daily   gabapentin  200 mg Oral TID   pantoprazole  40  mg Oral Daily   polyethylene glycol  17 g Oral Daily   rosuvastatin  10 mg Oral Daily   sertraline  100 mg Oral Daily   Continuous Infusions:  sodium chloride 75 mL/hr at 11/16/20 0545     LOS: 7 days    Time spent: 15 minutes    Sidney Ace, MD [facility] Pager [phone]  If 7PM-7AM, please contact night-coverage 11/16/2020, 1:22 PM

## 2020-11-16 NOTE — Plan of Care (Signed)

## 2020-11-17 MED ORDER — POLYETHYLENE GLYCOL 3350 17 G PO PACK: 17.0000 g | PACK | Freq: Every day | ORAL | Status: DC | PRN

## 2020-11-17 MED ORDER — LOPERAMIDE HCL 2 MG PO CAPS: 2.0000 mg | ORAL_CAPSULE | Freq: Four times a day (QID) | ORAL | Status: DC | PRN

## 2020-11-17 NOTE — Plan of Care (Signed)

## 2020-11-17 NOTE — TOC Progression Note (Signed)
Transition of Care [facility]) - Progression Note    Patient Details  Name: [name] MRN: [id number] Date of Birth: 02/02/1946  Transition of Care [facility] CM/SW Contact  Su Hilt, RN Phone Number: 11/17/2020, 10:17 AM  Clinical Narrative:     Reached out to Hilda Blades with [facility] to inquire on status of the auth, Still pending       Expected Discharge Plan and Services                                                 Social Determinants of Health (SDOH) Interventions    Readmission Risk Interventions No flowsheet data found.

## 2020-11-17 NOTE — Progress Notes (Signed)
PROGRESS NOTE    [name]  [id number] DOB: [birth year] DOA: 11/07/2020 PCP: Burnard Hawthorne, FNP   Brief Narrative:  75 y.o. female with a past medical history of coronary artery disease, history of DVT after childbirth, hypertension, hyperlipidemia, chronic low back pain, ambulatory dysfunction.  The patient presents to the emergency department due to numbness and tingling of bilateral lower extremities and upper extremities.  She has also been unable to ambulate at all for the past few days. Started on IV copper infusion after neurology recommendations.  Feels that her ataxia is slowly starting to improve.  Copper infusions to be completed 7/27.  Etiology of patient's copper deficiency is unclear.  Presentation is inconsistent with Wilson's disease.  Case discussed with GI.  Possible GI related etiologies of impaired copper absorption include celiac disease and vitamin malabsorption.  Further work-up in progress however patient can discharge to skilled nursing facility once insurance authorization is obtained.  If necessary can follow-up with gastroenterology as outpatient.  Patient is medically stable for discharge as of 7/28.  Excepted a bed at [facility].  Insurance authorization pending.   Assessment & Plan:   Active Problems:   Weakness   Ambulatory dysfunction   CNS demyelination (HCC)   Copper deficiency myeloneuropathy (HCC)   Numbness and tingling  Acute ataxia Decreased ability to ambulate Possibly secondary to copper deficiency Also possible contribution from [id number] folic acid deficiency CT imaging survey and MRI imaging survey negative Completed 5 days of copper infusion on 7/27 Vitamin B1, B6, C all in reference range Celiac disease panel negative Plan: Continue IM B12 Oral supplement on discharge Continue folic acid replacement Continue therapy.  Plan for discharge to SNF Continue Neurontin 200 mg 3 times daily Outpatient follow-up with GI Consider  follow-up with endocrinology versus GI at research facility to determine cause of persistent copper deficiency  Chronic back pain PTA oxycodone and Flexeril Gabapentin 200 mg p.o. 3 times daily  Acute on chronic iron deficiency and folate deficiency anemia Venofer [id number] mg IV daily Folic acid replacement as above Will convert to p.o. at time of discharge  Hypovolemic hyponatremia Likely secondary to dehydration Sodium improved No further need for IV fluids     DVT prophylaxis: SQ Lovenox Code Status: Full Family Communication: None today Disposition Plan: Status is: Inpatient  Remains inpatient appropriate because: Unsafe discharge plan  Dispo: The patient is from: Home              Anticipated d/c is to: SNF              Patient currently is medically stable for discharge   Difficult to place patient No  Patient accepted a bed offer at [facility].  Insurance authorization is pending.     Level of care: Med-Surg  Consultants:  Neurology  Procedures:  None  Antimicrobials:  None   Subjective: Patient seen and examined.  Resting comfortably in bed.  No visible distress.  Objective: Vitals:   11/16/20 1537 11/16/20 1950 11/17/20 0539 11/17/20 0808  BP: (!) 100/55 120/63 (!) 97/50 120/67  Pulse: 99 97 84 98  Resp: '16 20 19 17  '$ Temp: 98.2 F (36.8 C) 98.4 F (36.9 C) 98.9 F (37.2 C) 98 F (36.7 C)  TempSrc:    Oral  SpO2: 95% 99% 94% 94%  Weight:      Height:        Intake/Output Summary (Last 24 hours) at 11/17/2020 1341 Last data filed at  11/17/2020 1200 Gross per 24 hour  Intake 722.66 ml  Output 2900 ml  Net -2177.34 ml   Filed Weights   11/07/20 1518 11/07/20 1600  Weight: 54.4 kg 50 kg    Examination:  General exam: No acute distress Respiratory system: Clear to auscultation. Respiratory effort normal. Cardiovascular system: S1-S2, regular rate and rhythm, no murmurs, no pedal edema  gastrointestinal system: Soft, nontender,  nondistended normal bowel sounds Central nervous system: Alert, oriented x3, numbness noted bilateral hands and feet.  Gait not assessed  extremities: Symmetric 5 x 5 power. Skin: No rashes, lesions or ulcers Psychiatry: Judgement and insight appear normal. Mood & affect appropriate.     Data Reviewed: I have personally reviewed following labs and imaging studies  CBC: Recent Labs  Lab 11/11/20 0555 11/12/20 0452 11/13/20 0612  WBC 5.6 5.6 6.3  HGB 7.7* 7.6* 7.7*  HCT 25.3* 24.9* 25.2*  MCV 93.4 94.7 95.5  PLT 256 244 [id number]   Basic Metabolic Panel: Recent Labs  Lab 11/11/20 0555 11/12/20 0452 11/13/20 0612 11/14/20 0406  NA 137 139 136  --   K 4.0 4.4 4.2  --   CL 107 106 100  --   CO2 '25 29 29  '$ --   GLUCOSE 80 98 94  --   BUN '12 11 9  '$ --   CREATININE 0.61 0.57 0.44 0.45  CALCIUM 8.7* 8.9 9.5  --    GFR: Estimated Creatinine Clearance: 45.7 mL/min (by C-G formula based on SCr of 0.45 mg/dL). Liver Function Tests: No results for input(s): AST, ALT, ALKPHOS, BILITOT, PROT, ALBUMIN in the last 168 hours.  No results for input(s): LIPASE, AMYLASE in the last 168 hours.  No results for input(s): AMMONIA in the last 168 hours. Coagulation Profile: No results for input(s): INR, PROTIME in the last 168 hours. Cardiac Enzymes: No results for input(s): CKTOTAL, CKMB, CKMBINDEX, TROPONINI in the last 168 hours. BNP (last 3 results) No results for input(s): PROBNP in the last 8760 hours. HbA1C: No results for input(s): HGBA1C in the last 72 hours. CBG: Recent Labs  Lab 11/12/20 0723 11/12/20 1139 11/12/20 1532 11/12/20 2037 11/13/20 1210  GLUCAP 96 142* 128* 121* 137*   Lipid Profile: No results for input(s): CHOL, HDL, LDLCALC, TRIG, CHOLHDL, LDLDIRECT in the last 72 hours. Thyroid Function Tests: No results for input(s): TSH, T4TOTAL, FREET4, T3FREE, THYROIDAB in the last 72 hours. Anemia Panel: No results for input(s): VITAMINB12, FOLATE, FERRITIN, TIBC,  IRON, RETICCTPCT in the last 72 hours.  Sepsis Labs: No results for input(s): PROCALCITON, LATICACIDVEN in the last 168 hours.  Recent Results (from the past 240 hour(s))  Resp Panel by RT-PCR (Flu A&B, Covid) Nasopharyngeal Swab     Status: None   Collection Time: 11/07/20  4:19 PM   Specimen: Nasopharyngeal Swab; Nasopharyngeal(NP) swabs in vial transport medium  Result Value Ref Range Status   SARS Coronavirus 2 by RT PCR NEGATIVE NEGATIVE Final    Comment: (NOTE) SARS-CoV-2 target nucleic acids are NOT DETECTED.  The SARS-CoV-2 RNA is generally detectable in upper respiratory specimens during the acute phase of infection. The lowest concentration of SARS-CoV-2 viral copies this assay can detect is 138 copies/mL. A negative result does not preclude SARS-Cov-2 infection and should not be used as the sole basis for treatment or other patient management decisions. A negative result may occur with  improper specimen collection/handling, submission of specimen other than nasopharyngeal swab, presence of viral mutation(s) within the areas targeted by this assay,  and inadequate number of viral copies(<138 copies/mL). A negative result must be combined with clinical observations, patient history, and epidemiological information. The expected result is Negative.  Fact Sheet for Patients:  EntrepreneurPulse.com.au  Fact Sheet for Healthcare Providers:  IncredibleEmployment.be  This test is no t yet approved or cleared by the [place] FDA and  has been authorized for detection and/or diagnosis of SARS-CoV-2 by FDA under an Emergency Use Authorization (EUA). This EUA will remain  in effect (meaning this test can be used) for the duration of the COVID-19 declaration under Section 564(b)(1) of the Act, 21 U.S.C.section 360bbb-3(b)(1), unless the authorization is terminated  or revoked sooner.       Influenza A by PCR NEGATIVE NEGATIVE Final    Influenza B by PCR NEGATIVE NEGATIVE Final    Comment: (NOTE) The Xpert Xpress SARS-CoV-2/FLU/RSV plus assay is intended as an aid in the diagnosis of influenza from Nasopharyngeal swab specimens and should not be used as a sole basis for treatment. Nasal washings and aspirates are unacceptable for Xpert Xpress SARS-CoV-2/FLU/RSV testing.  Fact Sheet for Patients: EntrepreneurPulse.com.au  Fact Sheet for Healthcare Providers: IncredibleEmployment.be  This test is not yet approved or cleared by the [place] FDA and has been authorized for detection and/or diagnosis of SARS-CoV-2 by FDA under an Emergency Use Authorization (EUA). This EUA will remain in effect (meaning this test can be used) for the duration of the COVID-19 declaration under Section 564(b)(1) of the Act, 21 U.S.C. section 360bbb-3(b)(1), unless the authorization is terminated or revoked.  Performed at [facility], [address] [place], [place] [address]   Urine Culture     Status: Abnormal   Collection Time: 11/07/20  4:19 PM   Specimen: Urine, Clean Catch  Result Value Ref Range Status   Specimen Description   Final    URINE, CLEAN CATCH Performed at [facility], [address], [place], [place] [address]    Special Requests   Final    NONE Performed at [facility], [place] [address] [place], [place] [address]    Culture MULTIPLE SPECIES PRESENT, SUGGEST RECOLLECTION (A)  Final   Report Status 11/09/2020 FINAL  Final         Radiology Studies: No results found.      Scheduled Meds:  aspirin EC  81 mg Oral Daily   calcium carbonate  1 tablet Oral BID WC   cholecalciferol  1,000 Units Oral Daily   cyanocobalamin  1,000 mcg Intramuscular Daily   enoxaparin (LOVENOX) injection  40 mg Subcutaneous Q24H   ezetimibe  10 mg Oral Daily   feeding supplement  237 mL Oral BID BM   folic acid  5 mg Oral Daily   gabapentin  200 mg  Oral TID   pantoprazole  40 mg Oral Daily   rosuvastatin  10 mg Oral Daily   sertraline  100 mg Oral Daily   Continuous Infusions:     LOS: 8 days    Time spent: 15 minutes    Sidney Ace, MD [facility] Pager [phone]  If 7PM-7AM, please contact night-coverage 11/17/2020, 1:41 PM

## 2020-11-18 LAB — RESP PANEL BY RT-PCR (FLU A&B, COVID) ARPGX2
Influenza A by PCR: NEGATIVE
Influenza B by PCR: NEGATIVE
SARS Coronavirus 2 by RT PCR: NEGATIVE

## 2020-11-18 MED ORDER — CALCIUM CARBONATE ANTACID 500 MG PO CHEW: 1.0000 | CHEWABLE_TABLET | Freq: Two times a day (BID) | ORAL | Status: DC

## 2020-11-18 MED ORDER — FOLIC ACID 1 MG PO TABS: 5.0000 mg | ORAL_TABLET | Freq: Every day | ORAL | Status: DC

## 2020-11-18 MED ORDER — GABAPENTIN 100 MG PO CAPS: 200.0000 mg | ORAL_CAPSULE | Freq: Three times a day (TID) | ORAL | 3 refills | Status: DC

## 2020-11-18 MED ORDER — OXYCODONE HCL 10 MG PO TABS: 10.0000 mg | ORAL_TABLET | Freq: Four times a day (QID) | ORAL | 0 refills | Status: AC | PRN

## 2020-11-18 NOTE — Care Management Important Message (Signed)
Important Message  Patient Details  Name: [name] MRN: [id number] Date of Birth: Feb 26, 1946   Medicare Important Message Given:  Yes     Juliann Pulse A Kayshaun Polanco 11/18/2020, 11:22 AM

## 2020-11-18 NOTE — Plan of Care (Signed)

## 2020-11-18 NOTE — Plan of Care (Signed)
  Problem: Education: Goal: Knowledge of General Education information will improve Description: Including pain rating scale, medication(s)/side effects and non-pharmacologic comfort measures 11/18/2020 1054 by Elliona Doddridge, Debbe Mounts, RN Outcome: Completed/Met 11/18/2020 1054 by Elsie Ra, RN Outcome: Adequate for Discharge 11/18/2020 1053 by Jaloni Sorber, Debbe Mounts, RN Outcome: Progressing   Problem: Pain Managment: Goal: General experience of comfort will improve 11/18/2020 1054 by Marlayna Bannister, Debbe Mounts, RN Outcome: Completed/Met 11/18/2020 1054 by Elsie Ra, RN Outcome: Adequate for Discharge 11/18/2020 1053 by Larone Kliethermes, Debbe Mounts, RN Outcome: Progressing   Problem: Safety: Goal: Ability to remain free from injury will improve 11/18/2020 1054 by Michi Herrmann, Debbe Mounts, RN Outcome: Completed/Met 11/18/2020 1054 by Elsie Ra, RN Outcome: Adequate for Discharge 11/18/2020 1053 by Aron Inge, Debbe Mounts, RN Outcome: Progressing   Problem: Skin Integrity: Goal: Risk for impaired skin integrity will decrease 11/18/2020 1054 by Elsie Ra, RN Outcome: Completed/Met 11/18/2020 1054 by Elsie Ra, RN Outcome: Adequate for Discharge 11/18/2020 1053 by Deaisha Welborn, Debbe Mounts, RN Outcome: Progressing

## 2020-11-18 NOTE — Discharge Summary (Signed)
Physician Discharge Summary  [name] [id number] DOB: [birth year] DOA: 11/07/2020  PCP: Burnard Hawthorne, FNP  Admit date: 11/07/2020 Discharge date: 11/18/2020  Admitted From: Home Disposition: SNF, [facility]  Recommendations for Outpatient Follow-up:  Follow up with PCP in 1-2 weeks Consider visiting research center for more in-depth evaluation of copper deficiency, such as [facility] or [facility]: No Equipment/Devices: None  Discharge Condition: Stable CODE STATUS: Full Diet recommendation: Heart healthy  Brief/Interim Summary:  75 y.o. female with a past medical history of coronary artery disease, history of DVT after childbirth, hypertension, hyperlipidemia, chronic low back pain, ambulatory dysfunction.  The patient presents to the emergency department due to numbness and tingling of bilateral lower extremities and upper extremities.  She has also been unable to ambulate at all for the past few days. Started on IV copper infusion after neurology recommendations.  Feels that her ataxia is slowly starting to improve.  Copper infusions to be completed 7/27.  Etiology of patient's copper deficiency is unclear.  Presentation is inconsistent with Wilson's disease.   Case discussed with GI.  Possible GI related etiologies of impaired copper absorption include celiac disease and vitamin malabsorption.  Further work-up in progress however patient can discharge to skilled nursing facility once insurance authorization is obtained.  If necessary can follow-up with gastroenterology as outpatient.   Patient is medically stable for discharge as of 7/28.  Excepted a bed at [facility].  Insurance authorization pending.  Insurance authorization obtained.  Patient discharged on 8/1.  We had a lengthy discussion prior to discharge.  The etiology of the patient's persistent copper deficiency is unclear.  No evidence of GI copper loss.  Celiac panel negative.  Neurology unable to  determine exactly why the patient remains persistently copper deficient.  I suggested the patient that she seek out specialized consultation at a tertiary care referral center such as [facility] or [facility].    Discharge Diagnoses:  Active Problems:   Weakness   Ambulatory dysfunction   CNS demyelination (HCC)   Copper deficiency myeloneuropathy (HCC)   Numbness and tingling  Acute ataxia Decreased ability to ambulate Possibly secondary to copper deficiency Also possible contribution from 123456 folic acid deficiency CT imaging survey and MRI imaging survey negative Completed 5 days of copper infusion on 7/27 Vitamin B1, B6, C all in reference range Celiac disease panel negative Plan: Discharge to skilled nursing facility.  Continue Neurontin.  Continue 123456 and folic acid supplementation.  Consider outpatient follow-up with GI for endoscopic evaluation.  Also consider referral to tertiary care research center such as [facility] or [facility] for further evaluation of persistent copper deficiency   Chronic back pain PTA oxycodone and Flexeril Gabapentin 200 mg p.o. 3 times daily   Acute on chronic iron deficiency and folate deficiency anemia Venofer [id number] mg IV daily Folic acid replacement as above Will convert to p.o. at time of discharge   Hypovolemic hyponatremia Likely secondary to dehydration Sodium improved No further need for IV fluids  Discharge Instructions  Discharge Instructions     Diet - low sodium heart healthy   Complete by: As directed    Increase activity slowly   Complete by: As directed    No wound care   Complete by: As directed       Allergies as of 11/18/2020   No Known Allergies      Medication List     TAKE these medications    aspirin 81 MG EC tablet  Take 81 mg by mouth daily.   calcium carbonate 500 MG chewable tablet Commonly known as: TUMS - dosed in mg elemental calcium Chew 1 tablet (200 mg of elemental calcium total) by mouth 2 (two) times daily with a  meal.   cyclobenzaprine 5 MG tablet Commonly known as: FLEXERIL Take 1 tablet (5 mg total) by mouth 3 (three) times daily as needed for muscle spasms.   ezetimibe 10 MG tablet Commonly known as: ZETIA Take 1 tablet (10 mg total) by mouth daily.   folic acid 1 MG tablet Commonly known as: FOLVITE Take 5 tablets (5 mg total) by mouth daily.   furosemide 20 MG tablet Commonly known as: Lasix Take 1 tablet (20 mg total) by mouth daily as needed.   gabapentin 100 MG capsule Commonly known as: NEURONTIN Take 2 capsules (200 mg total) by mouth 3 (three) times daily. What changed: how much to take   metoprolol tartrate 25 MG tablet Commonly known as: LOPRESSOR Take 0.5 tablets (12.5 mg total) by mouth 2 (two) times daily.   Oxycodone HCl 10 MG Tabs Take 1 tablet (10 mg total) by mouth every 6 (six) hours as needed for up to 3 days. SNF use only What changed:  when to take this additional instructions   pantoprazole 40 MG tablet Commonly known as: PROTONIX Take 1 tablet (40 mg total) by mouth daily.   PRESERVISION AREDS 2 PO Take 1 capsule by mouth 2 (two) times daily.   RISA-BID PROBIOTIC PO Take 250 mg by mouth daily.   rosuvastatin 10 MG tablet Commonly known as: Crestor Take 1 tablet (10 mg total) by mouth daily.   sertraline 100 MG tablet Commonly known as: ZOLOFT Take 100 mg by mouth daily.   Sleep Aid 25 MG tablet Generic drug: doxylamine (Sleep) Take 25 mg by mouth at bedtime as needed.   vitamin B-12 1000 MCG tablet Commonly known as: CYANOCOBALAMIN Take 1,000 mcg by mouth daily.   Vitamin D3 25 MCG (1000 UT) Caps Take 1,000 Units by mouth daily.        Contact information for after-discharge care     Destination     [facility] .   Service: Skilled Nursing Contact information: [address] [place] [phone]                    No Known  Allergies  Consultations: Neurology   Procedures/Studies: CT Head Wo Contrast  Result Date: 11/07/2020 CLINICAL DATA:  Neck pain acute. weakness and loss of appetite for 4 days. Pt able to walk with assistance. EXAM: CT HEAD WITHOUT CONTRAST CT CERVICAL SPINE WITHOUT CONTRAST TECHNIQUE: Multidetector CT imaging of the head and cervical spine was performed following the standard protocol without intravenous contrast. Multiplanar CT image reconstructions of the cervical spine were also generated. COMPARISON:  None. FINDINGS: CT HEAD FINDINGS Brain: No evidence of large-territorial acute infarction. No parenchymal hemorrhage. No mass lesion. No extra-axial collection. No mass effect or midline shift. No hydrocephalus. Basilar cisterns are patent. Vascular: No hyperdense vessel. Atherosclerotic calcifications are present within the cavernous internal carotid arteries. Skull: No acute fracture or focal lesion. Sinuses/Orbits: Paranasal sinuses and mastoid air cells are clear. The orbits are unremarkable. Other: None. CT CERVICAL SPINE FINDINGS Alignment: Normal. Skull base and vertebrae: Multilevel degenerative changes of the spine with multilevel severe osseous neural foraminal stenosis. No severe osseous central canal stenosis. No acute fracture. No aggressive appearing focal osseous lesion  or focal pathologic process. Soft tissues and spinal canal: No prevertebral fluid or swelling. No visible canal hematoma. Upper chest: Unremarkable. Other: Atherosclerotic plaque carotid arteries within the neck. IMPRESSION: 1. No acute intracranial abnormality. 2. No acute displaced fracture or traumatic listhesis of the cervical spine. 3. Multilevel degenerative changes of the spine with multilevel severe osseous neural foraminal stenosis. Electronically Signed   By: Iven Finn M.D.   On: 11/07/2020 20:38   CT Cervical Spine Wo Contrast  Result Date: 11/07/2020 CLINICAL DATA:  Neck pain acute. weakness and loss  of appetite for 4 days. Pt able to walk with assistance. EXAM: CT HEAD WITHOUT CONTRAST CT CERVICAL SPINE WITHOUT CONTRAST TECHNIQUE: Multidetector CT imaging of the head and cervical spine was performed following the standard protocol without intravenous contrast. Multiplanar CT image reconstructions of the cervical spine were also generated. COMPARISON:  None. FINDINGS: CT HEAD FINDINGS Brain: No evidence of large-territorial acute infarction. No parenchymal hemorrhage. No mass lesion. No extra-axial collection. No mass effect or midline shift. No hydrocephalus. Basilar cisterns are patent. Vascular: No hyperdense vessel. Atherosclerotic calcifications are present within the cavernous internal carotid arteries. Skull: No acute fracture or focal lesion. Sinuses/Orbits: Paranasal sinuses and mastoid air cells are clear. The orbits are unremarkable. Other: None. CT CERVICAL SPINE FINDINGS Alignment: Normal. Skull base and vertebrae: Multilevel degenerative changes of the spine with multilevel severe osseous neural foraminal stenosis. No severe osseous central canal stenosis. No acute fracture. No aggressive appearing focal osseous lesion or focal pathologic process. Soft tissues and spinal canal: No prevertebral fluid or swelling. No visible canal hematoma. Upper chest: Unremarkable. Other: Atherosclerotic plaque carotid arteries within the neck. IMPRESSION: 1. No acute intracranial abnormality. 2. No acute displaced fracture or traumatic listhesis of the cervical spine. 3. Multilevel degenerative changes of the spine with multilevel severe osseous neural foraminal stenosis. Electronically Signed   By: Iven Finn M.D.   On: 11/07/2020 20:38   MR BRAIN WO CONTRAST  Result Date: 11/08/2020 CLINICAL DATA:  Neuro deficit, acute, stroke suspected. EXAM: MRI HEAD WITHOUT CONTRAST TECHNIQUE: Multiplanar, multiecho pulse sequences of the brain and surrounding structures were obtained without intravenous contrast.  COMPARISON:  Head CT 11/07/2020. FINDINGS: Brain: Mild intermittent motion degradation. Mild generalized cerebral and cerebellar atrophy. Mild-to-moderate multifocal T2/FLAIR hyperintensity within the cerebral white matter, nonspecific but compatible with chronic small vessel ischemic disease. Chronic lacunar infarct within the right thalamus. Multiple small chronic infarcts within the bilateral cerebellar hemispheres. There are a few scattered supratentorial and infratentorial chronic microhemorrhages. There is no acute infarct. No evidence of an intracranial mass. No extra-axial fluid collection. No midline shift. Vascular: Expected proximal arterial flow voids. Skull and upper cervical spine: No focal marrow lesion. Incompletely assessed cervical spondylosis. Apparent T2 hyperintense signal abnormality and abnormal restricted diffusion within the dorsal columns of the partially imaged upper cervical spinal cord (for instance as seen on series 20, image 1) (series 15, image 52). Sinuses/Orbits: Visualized orbits show no acute finding. Bilateral lens replacements. Trace bilateral ethmoid sinus mucosal thickening. Impression #2 will be called to the ordering clinician or representative by the Radiologist Assistant, and communication documented in the PACS or [employer]. IMPRESSION: No evidence of acute intracranial abnormality. Apparent T2 hyperintense signal abnormality and abnormal restricted diffusion within the dorsal columns of the partially imaged upper cervical spinal cord. A dedicated cervical spine MRI (to include diffusion weighted imaging) is recommended for further evaluation. Mild-to-moderate chronic small vessel ischemic changes within the cerebral white matter. Chronic lacunar infarct  within the right thalamus. Multiple small chronic infarcts within the bilateral cerebellar hemispheres. Few scattered supratentorial and infratentorial chronic microhemorrhages, nonspecific but likely reflecting  sequela of hypertensive microangiopathy. Mild generalized parenchymal atrophy. Electronically Signed   By: Kellie Simmering DO   On: 11/08/2020 08:05   MR LUMBAR SPINE WO CONTRAST  Result Date: 11/08/2020 CLINICAL DATA:  Severe low back pain. Inability to walk and bilateral lower extremity weakness. No known injury. EXAM: MRI LUMBAR SPINE WITHOUT CONTRAST TECHNIQUE: Multiplanar, multisequence MR imaging of the lumbar spine was performed. No intravenous contrast was administered. COMPARISON:  MRI lumbar spine 08/31/2019. FINDINGS: Segmentation:  Standard. Alignment:  Trace retrolisthesis L1 on L2.  Otherwise maintained. Vertebrae: No acute fracture, evidence of discitis, or bone lesion. Mild, remote superior endplate compression fracture of L1 with vertebral body height loss of 10-15% is again seen. Degenerative endplate signal change is most notable at L4-5 and L5-S1 and unchanged. Conus medullaris and cauda equina: Conus extends to the L2-3 level. Conus and cauda equina appear normal. Paraspinal and other soft tissues: Negative. Disc levels: T11-12 is imaged in the sagittal plane only and negative. T12-L1: Minimal disc bulge without stenosis. L1-2: Minimal disc bulge without stenosis. L2-3: Negative. L3-4: Loss of disc space height with a shallow bulge, more prominent to the left. No stenosis. L4-5: Loss of disc space height with a shallow bulge and right subarticular recess protrusion. There is some ligamentum flavum thickening and mild facet arthropathy. Mild right foraminal narrowing is unchanged. The central canal and left foramen are open. No nerve root compression. L5-S1: Shallow broad-based disc bulge and moderate facet degenerative disease. Mild bilateral foraminal narrowing. The central canal is open. No change. IMPRESSION: Negative for acute fracture.  No change since the prior exam. The central canal is patent at all levels with mild multilevel degenerative disc disease as described above. Mild right  foraminal narrowing at L4-5 and mild bilateral foraminal narrowing at L5-S1 noted. No nerve root compression. Mild, remote superior endplate compression fracture of L1. Electronically Signed   By: Inge Rise M.D.   On: 11/08/2020 07:59   MR CERVICAL SPINE W WO CONTRAST  Result Date: 11/08/2020 CLINICAL DATA:  Myelopathy, acute or progressive; CSF leak suspected/ Spontaneous intracranial hypotension EXAM: MRI CERVICAL AND THORACIC SPINE WITHOUT AND WITH CONTRAST TECHNIQUE: Multiplanar and multiecho pulse sequences of the cervical spine, to include the craniocervical junction and cervicothoracic junction, and the thoracic spine, were obtained without and with intravenous contrast. CONTRAST:  73m GADAVIST GADOBUTROL 1 MMOL/ML IV SOLN COMPARISON:  CT of the cervical spine 11/07/2020. brain MRI performed earlier today 11/08/2020. Lumbar spine MRI performed earlier today 11/08/2020. FINDINGS: Mild intermittent motion degradation. MRI CERVICAL SPINE FINDINGS Alignment: Straightening of the expected cervical lordosis. Trace C2-C3, C3-C4, C4-C5, C5-C6 and C6-C7 grade 1 retrolisthesis. Vertebrae: Vertebral body height is maintained. Multilevel degenerative endplate irregularity and mixed degenerative endplate marrow signal. This includes mild degenerative endplate edema and enhancement at C2-C3, C3-C4, C4-C5, C5-C6 and C6-C7. Cord: There is T2/STIR hyperintense signal abnormality within the dorsal columns throughout much of the cervical spinal cord. There is corresponding restricted diffusion within the dorsal columns, at least at the upper cervical levels. No abnormal cord enhancement is identified. Multilevel spinal cord impingement, as described below. There is limited assessment for spinal cord signal abnormality at the C3-C4, C4-C5 and C5-C6 levels due to the degree of cord compression. Posterior Fossa, vertebral arteries, paraspinal tissues: Posterior fossa better assessed on brain MRI performed earlier today.  Flow voids preserved  within the imaged cervical vertebral arteries. Paraspinal soft tissues within normal limits. Disc levels: Multilevel disc degeneration. Most notably, there is moderate/advanced disc degeneration at C2-C3, C3-C4, C4-C5, C5-C6 and C6-C7. C2-C3: Trace grade 1 retrolisthesis. Disc bulge with bilateral disc osteophyte ridge/uncinate hypertrophy. Ligamentum flavum hypertrophy. Severe spinal canal stenosis with mild spinal cord flattening. No significant foraminal stenosis. C3-C4: Trace grade 1 retrolisthesis. Posterior disc osteophyte complex with bilateral disc osteophyte ridge/uncinate hypertrophy. Facet and ligamentum flavum hypertrophy. Severe spinal canal stenosis with significant spinal cord impingement. Bilateral neural foraminal narrowing (mild right, moderate left). C4-C5: Trace grade 1 retrolisthesis. Posterior disc osteophyte complex with bilateral disc osteophyte ridge/uncinate hypertrophy. Facet and ligamentum flavum hypertrophy. Severe spinal canal stenosis with significant spinal cord impingement. Moderate/severe bilateral neural foraminal narrowing (greater on the left). C5-C6: Trace grade 1 retrolisthesis. Posterior disc osteophyte complex with bilateral disc osteophyte ridge/uncinate hypertrophy. Facet arthrosis and ligamentum flavum hypertrophy. Severe spinal canal stenosis with significant spinal cord impingement. Bilateral neural foraminal narrowing (severe right, moderate/severe left). C6-C7: Posterior disc osteophyte complex with bilateral disc osteophyte ridge/uncinate hypertrophy. Minimal facet arthrosis and ligamentum flavum hypertrophy. Mild relative spinal canal narrowing without spinal cord mass effect. Moderate right neural foraminal narrowing. C7-T1: Shallow disc bulge. No significant spinal canal or foraminal stenosis. MRI THORACIC SPINE FINDINGS Alignment: Thoracic dextrocurvature. Exaggerated thoracic kyphosis. Trace T2-T3 grade 1 anterolisthesis. Vertebrae: Mild  chronic T12 superior endplate compression deformity. Vertebral body height is otherwise maintained. Multilevel degenerative endplate irregularity and mixed degenerative endplate marrow signal. This includes mild degenerative endplate edema and enhancement at T7-T8. Fusion across the disc spaces anteriorly at T5-T6 and T6-T7. Cord: There is T2/STIR hyperintense signal abnormality within the dorsal columns throughout much of the thoracic spinal cord. No abnormal cord enhancement is identified. Paraspinal and other soft tissues: No abnormality identified within included portions of the thorax or upper abdomen/retroperitoneum. Paraspinal soft tissues unremarkable. Disc levels: Multilevel disc degeneration. Most notably, there is moderate/advanced disc degeneration at T1-T2, T4-T5, T5-T6 and T6-T7. T1-T2: Disc bulge with endplate spurring. Minimal partial effacement of the ventral thecal sac without spinal cord mass effect. Mild right neural foraminal narrowing. T2-T3: Trace grade 1 anterolisthesis. Disc uncovering with shallow disc bulge. Minimal partial effacement of the ventral thecal sac without spinal cord mass effect. No significant foraminal stenosis. T3-T4: Shallow disc bulge. No significant spinal canal stenosis or neural foraminal narrowing. T4-T5: Shallow disc bulge. Mild partial effacement of ventral thecal sac without significant spinal cord mass effect. No significant foraminal stenosis. T5-T6: Fusion across the ventral aspect of the disc space. Disc bulge with endplate spurring. Partial effacement of the ventral thecal sac with possible contact upon the ventral spinal cord. No significant foraminal stenosis. T6-T7: Disc bulge with endplate spurring. The disc bulge contacts and flattens the ventral spinal cord. No significant foraminal narrowing. T7-T8: Shallow disc bulge with endplate spurring. No significant spinal canal or foraminal stenosis. T8-T9: Shallow disc bulge. No significant spinal canal or  foraminal stenosis. T9-T10: No significant disc herniation or stenosis. T10-T11: No significant disc herniation or stenosis. T11-T12: Small disc bulge. Minimal partial effacement of the ventral thecal sac without spinal cord mass effect. No significant foraminal stenosis. These results were called by telephone at the time of interpretation on 11/08/2020 at 4:40 pm to provider MCNE[facility] , who verbally acknowledged these results. IMPRESSION: Cervical spine: 1. T2/STIR hyperintense signal abnormality within the dorsal columns throughout much of the cervical spinal cord. Corresponding restricted diffusion within the dorsal columns, at least at the upper cervical levels.  Findings are nonspecific and potential differential considerations include B12 deficiency/subacute combined degeneration, copper deficiency, or syphilis. Cord infarction and demyelinating disease are considered less likely. 2. Cervical spondylosis, as outlined and with findings most notably as follows. 3. At C2-C3, there is multifactorial severe spinal canal stenosis with mild spinal cord flattening. 4. At C3-C4, there is multifactorial severe spinal canal stenosis with significant spinal cord impingement. Bilateral neural foraminal narrowing (mild right, moderate left) 5. At C4-C5, there is multifactorial severe spinal canal stenosis with significant spinal cord impingement. Moderate/severe bilateral neural foraminal narrowing. 6. At C5-C6, there is multifactorial severe spinal canal stenosis with significant spinal cord impingement. Bilateral neural foraminal narrowing (severe right, moderate/severe left). MRI thoracic spine: 1. T2/STIR hyperintense signal abnormality within the dorsal columns throughout much of the thoracic spinal cord. Findings are nonspecific and potential differential considerations include B12 deficiency/subacute combined degeneration, copper deficiency, or syphilis. Cord infarction and demyelinating disease are  considered less likely. 2. Thoracic spondylosis, as outlined. 3. At T6-T7, a disc bulge and associated endplate spurring contact and mildly flatten the ventral spinal cord. 4. No more than mild relative spinal canal narrowing at the remaining levels. No compressive foraminal stenosis. 5. Fusion across the disc spaces anteriorly at T5-T6 and T6-T7 Electronically Signed   By: Kellie Simmering DO   On: 11/08/2020 16:41   MR THORACIC SPINE W WO CONTRAST  Result Date: 11/08/2020 CLINICAL DATA:  Myelopathy, acute or progressive; CSF leak suspected/ Spontaneous intracranial hypotension EXAM: MRI CERVICAL AND THORACIC SPINE WITHOUT AND WITH CONTRAST TECHNIQUE: Multiplanar and multiecho pulse sequences of the cervical spine, to include the craniocervical junction and cervicothoracic junction, and the thoracic spine, were obtained without and with intravenous contrast. CONTRAST:  47m GADAVIST GADOBUTROL 1 MMOL/ML IV SOLN COMPARISON:  CT of the cervical spine 11/07/2020. brain MRI performed earlier today 11/08/2020. Lumbar spine MRI performed earlier today 11/08/2020. FINDINGS: Mild intermittent motion degradation. MRI CERVICAL SPINE FINDINGS Alignment: Straightening of the expected cervical lordosis. Trace C2-C3, C3-C4, C4-C5, C5-C6 and C6-C7 grade 1 retrolisthesis. Vertebrae: Vertebral body height is maintained. Multilevel degenerative endplate irregularity and mixed degenerative endplate marrow signal. This includes mild degenerative endplate edema and enhancement at C2-C3, C3-C4, C4-C5, C5-C6 and C6-C7. Cord: There is T2/STIR hyperintense signal abnormality within the dorsal columns throughout much of the cervical spinal cord. There is corresponding restricted diffusion within the dorsal columns, at least at the upper cervical levels. No abnormal cord enhancement is identified. Multilevel spinal cord impingement, as described below. There is limited assessment for spinal cord signal abnormality at the C3-C4, C4-C5 and  C5-C6 levels due to the degree of cord compression. Posterior Fossa, vertebral arteries, paraspinal tissues: Posterior fossa better assessed on brain MRI performed earlier today. Flow voids preserved within the imaged cervical vertebral arteries. Paraspinal soft tissues within normal limits. Disc levels: Multilevel disc degeneration. Most notably, there is moderate/advanced disc degeneration at C2-C3, C3-C4, C4-C5, C5-C6 and C6-C7. C2-C3: Trace grade 1 retrolisthesis. Disc bulge with bilateral disc osteophyte ridge/uncinate hypertrophy. Ligamentum flavum hypertrophy. Severe spinal canal stenosis with mild spinal cord flattening. No significant foraminal stenosis. C3-C4: Trace grade 1 retrolisthesis. Posterior disc osteophyte complex with bilateral disc osteophyte ridge/uncinate hypertrophy. Facet and ligamentum flavum hypertrophy. Severe spinal canal stenosis with significant spinal cord impingement. Bilateral neural foraminal narrowing (mild right, moderate left). C4-C5: Trace grade 1 retrolisthesis. Posterior disc osteophyte complex with bilateral disc osteophyte ridge/uncinate hypertrophy. Facet and ligamentum flavum hypertrophy. Severe spinal canal stenosis with significant spinal cord impingement. Moderate/severe bilateral neural foraminal narrowing (  greater on the left). C5-C6: Trace grade 1 retrolisthesis. Posterior disc osteophyte complex with bilateral disc osteophyte ridge/uncinate hypertrophy. Facet arthrosis and ligamentum flavum hypertrophy. Severe spinal canal stenosis with significant spinal cord impingement. Bilateral neural foraminal narrowing (severe right, moderate/severe left). C6-C7: Posterior disc osteophyte complex with bilateral disc osteophyte ridge/uncinate hypertrophy. Minimal facet arthrosis and ligamentum flavum hypertrophy. Mild relative spinal canal narrowing without spinal cord mass effect. Moderate right neural foraminal narrowing. C7-T1: Shallow disc bulge. No significant spinal  canal or foraminal stenosis. MRI THORACIC SPINE FINDINGS Alignment: Thoracic dextrocurvature. Exaggerated thoracic kyphosis. Trace T2-T3 grade 1 anterolisthesis. Vertebrae: Mild chronic T12 superior endplate compression deformity. Vertebral body height is otherwise maintained. Multilevel degenerative endplate irregularity and mixed degenerative endplate marrow signal. This includes mild degenerative endplate edema and enhancement at T7-T8. Fusion across the disc spaces anteriorly at T5-T6 and T6-T7. Cord: There is T2/STIR hyperintense signal abnormality within the dorsal columns throughout much of the thoracic spinal cord. No abnormal cord enhancement is identified. Paraspinal and other soft tissues: No abnormality identified within included portions of the thorax or upper abdomen/retroperitoneum. Paraspinal soft tissues unremarkable. Disc levels: Multilevel disc degeneration. Most notably, there is moderate/advanced disc degeneration at T1-T2, T4-T5, T5-T6 and T6-T7. T1-T2: Disc bulge with endplate spurring. Minimal partial effacement of the ventral thecal sac without spinal cord mass effect. Mild right neural foraminal narrowing. T2-T3: Trace grade 1 anterolisthesis. Disc uncovering with shallow disc bulge. Minimal partial effacement of the ventral thecal sac without spinal cord mass effect. No significant foraminal stenosis. T3-T4: Shallow disc bulge. No significant spinal canal stenosis or neural foraminal narrowing. T4-T5: Shallow disc bulge. Mild partial effacement of ventral thecal sac without significant spinal cord mass effect. No significant foraminal stenosis. T5-T6: Fusion across the ventral aspect of the disc space. Disc bulge with endplate spurring. Partial effacement of the ventral thecal sac with possible contact upon the ventral spinal cord. No significant foraminal stenosis. T6-T7: Disc bulge with endplate spurring. The disc bulge contacts and flattens the ventral spinal cord. No significant  foraminal narrowing. T7-T8: Shallow disc bulge with endplate spurring. No significant spinal canal or foraminal stenosis. T8-T9: Shallow disc bulge. No significant spinal canal or foraminal stenosis. T9-T10: No significant disc herniation or stenosis. T10-T11: No significant disc herniation or stenosis. T11-T12: Small disc bulge. Minimal partial effacement of the ventral thecal sac without spinal cord mass effect. No significant foraminal stenosis. These results were called by telephone at the time of interpretation on 11/08/2020 at 4:40 pm to provider [facility] , who verbally acknowledged these results. IMPRESSION: Cervical spine: 1. T2/STIR hyperintense signal abnormality within the dorsal columns throughout much of the cervical spinal cord. Corresponding restricted diffusion within the dorsal columns, at least at the upper cervical levels. Findings are nonspecific and potential differential considerations include B12 deficiency/subacute combined degeneration, copper deficiency, or syphilis. Cord infarction and demyelinating disease are considered less likely. 2. Cervical spondylosis, as outlined and with findings most notably as follows. 3. At C2-C3, there is multifactorial severe spinal canal stenosis with mild spinal cord flattening. 4. At C3-C4, there is multifactorial severe spinal canal stenosis with significant spinal cord impingement. Bilateral neural foraminal narrowing (mild right, moderate left) 5. At C4-C5, there is multifactorial severe spinal canal stenosis with significant spinal cord impingement. Moderate/severe bilateral neural foraminal narrowing. 6. At C5-C6, there is multifactorial severe spinal canal stenosis with significant spinal cord impingement. Bilateral neural foraminal narrowing (severe right, moderate/severe left). MRI thoracic spine: 1. T2/STIR hyperintense signal abnormality within the dorsal columns throughout  much of the thoracic spinal cord. Findings are nonspecific and  potential differential considerations include B12 deficiency/subacute combined degeneration, copper deficiency, or syphilis. Cord infarction and demyelinating disease are considered less likely. 2. Thoracic spondylosis, as outlined. 3. At T6-T7, a disc bulge and associated endplate spurring contact and mildly flatten the ventral spinal cord. 4. No more than mild relative spinal canal narrowing at the remaining levels. No compressive foraminal stenosis. 5. Fusion across the disc spaces anteriorly at T5-T6 and T6-T7 Electronically Signed   By: Kellie Simmering DO   On: 11/08/2020 16:41   (Echo, Carotid, EGD, Colonoscopy, ERCP)    Subjective: Seen and examined the day of discharge.  Stable no distress.  Stable for discharge to skilled nursing facility.  Discharge Exam: Vitals:   11/18/20 0447 11/18/20 0731  BP: (!) 104/42 (!) 94/49  Pulse: 87 88  Resp: 19 16  Temp: 98.9 F (37.2 C) 98 F (36.7 C)  SpO2: 92% 92%   Vitals:   11/17/20 0808 11/17/20 2053 11/18/20 0447 11/18/20 0731  BP: 120/67 112/60 (!) 104/42 (!) 94/49  Pulse: 98 (!) 101 87 88  Resp: '17 19 19 16  '$ Temp: 98 F (36.7 C) 98.6 F (37 C) 98.9 F (37.2 C) 98 F (36.7 C)  TempSrc: Oral     SpO2: 94% 97% 92% 92%  Weight:      Height:        General: Pt is alert, awake, not in acute distress Cardiovascular: RRR, S1/S2 +, no rubs, no gallops Respiratory: CTA bilaterally, no wheezing, no rhonchi Abdominal: Soft, NT, ND, bowel sounds + Extremities: Weakness and numbness and in the toes    The results of significant diagnostics from this hospitalization (including imaging, microbiology, ancillary and laboratory) are listed below for reference.     Microbiology: No results found for this or any previous visit (from the past 240 hour(s)).   Labs: BNP (last 3 results) No results for input(s): BNP in the last 8760 hours. Basic Metabolic Panel: Recent Labs  Lab 11/12/20 0452 11/13/20 0612 11/14/20 0406  NA 139 136  --    K 4.4 4.2  --   CL 106 100  --   CO2 29 29  --   GLUCOSE 98 94  --   BUN 11 9  --   CREATININE 0.57 0.44 0.45  CALCIUM 8.9 9.5  --    Liver Function Tests: No results for input(s): AST, ALT, ALKPHOS, BILITOT, PROT, ALBUMIN in the last 168 hours. No results for input(s): LIPASE, AMYLASE in the last 168 hours. No results for input(s): AMMONIA in the last 168 hours. CBC: Recent Labs  Lab 11/12/20 0452 11/13/20 0612  WBC 5.6 6.3  HGB 7.6* 7.7*  HCT 24.9* 25.2*  MCV 94.7 95.5  PLT 244 234   Cardiac Enzymes: No results for input(s): CKTOTAL, CKMB, CKMBINDEX, TROPONINI in the last 168 hours. BNP: Invalid input(s): POCBNP CBG: Recent Labs  Lab 11/12/20 0723 11/12/20 1139 11/12/20 1532 11/12/20 2037 11/13/20 1210  GLUCAP 96 142* 128* 121* 137*   D-Dimer No results for input(s): DDIMER in the last 72 hours. Hgb A1c No results for input(s): HGBA1C in the last 72 hours. Lipid Profile No results for input(s): CHOL, HDL, LDLCALC, TRIG, CHOLHDL, LDLDIRECT in the last 72 hours. Thyroid function studies No results for input(s): TSH, T4TOTAL, T3FREE, THYROIDAB in the last 72 hours.  Invalid input(s): FREET3 Anemia work up No results for input(s): VITAMINB12, FOLATE, FERRITIN, TIBC, IRON, RETICCTPCT in the last  72 hours. Urinalysis    Component Value Date/Time   COLORURINE YELLOW (A) 11/07/2020 1619   APPEARANCEUR HAZY (A) 11/07/2020 1619   APPEARANCEUR Clear 03/25/2017 1051   LABSPEC 1.014 11/07/2020 1619   LABSPEC 1.003 06/27/2011 1253   PHURINE 5.0 11/07/2020 1619   GLUCOSEU NEGATIVE 11/07/2020 1619   GLUCOSEU Negative 06/27/2011 1253   HGBUR NEGATIVE 11/07/2020 1619   BILIRUBINUR NEGATIVE 11/07/2020 1619   BILIRUBINUR Negative 03/25/2017 1051   BILIRUBINUR Negative 06/27/2011 1253   KETONESUR 20 (A) 11/07/2020 1619   PROTEINUR NEGATIVE 11/07/2020 1619   NITRITE POSITIVE (A) 11/07/2020 1619   LEUKOCYTESUR NEGATIVE 11/07/2020 1619   LEUKOCYTESUR Negative  06/27/2011 1253   Sepsis Labs Invalid input(s): PROCALCITONIN,  WBC,  LACTICIDVEN Microbiology No results found for this or any previous visit (from the past 240 hour(s)).   Time coordinating discharge: Over 30 minutes  SIGNED:   Sidney Ace, MD  [facility] 11/18/2020, 8:29 AM Pager   If 7PM-7AM, please contact night-coverage

## 2020-11-18 NOTE — Plan of Care (Signed)
  Problem: Education: Goal: Knowledge of General Education information will improve Description: Including pain rating scale, medication(s)/side effects and non-pharmacologic comfort measures 11/18/2020 1054 by Meha Vidrine, Debbe Mounts, RN Outcome: Adequate for Discharge 11/18/2020 1053 by Hailey Miles, Debbe Mounts, RN Outcome: Progressing   Problem: Pain Managment: Goal: General experience of comfort will improve 11/18/2020 1054 by Jaxyn Rout, Debbe Mounts, RN Outcome: Adequate for Discharge 11/18/2020 1053 by Leotha Voeltz, Debbe Mounts, RN Outcome: Progressing   Problem: Safety: Goal: Ability to remain free from injury will improve 11/18/2020 1054 by Jameer Storie, Debbe Mounts, RN Outcome: Adequate for Discharge 11/18/2020 1053 by Siena Poehler, Debbe Mounts, RN Outcome: Progressing   Problem: Skin Integrity: Goal: Risk for impaired skin integrity will decrease 11/18/2020 1054 by Elsie Ra, RN Outcome: Adequate for Discharge 11/18/2020 1053 by Marixa Mellott, Debbe Mounts, RN Outcome: Progressing

## 2020-11-18 NOTE — TOC Progression Note (Signed)
Transition of Care [facility]) - Progression Note    Patient Details  Name: [name] MRN: [id number] Date of Birth: 06/17/1945  Transition of Care [facility]) CM/SW Contact  Su Hilt, RN Phone Number: 11/18/2020, 10:26 AM  Clinical Narrative:    Patient to [facility] at noon to [facility] to [facility] room 300B Patient will alert her family        Expected Discharge Plan and Services           Expected Discharge Date: 11/18/20                                     Social Determinants of Health (SDOH) Interventions    Readmission Risk Interventions No flowsheet data found.

## 2020-11-18 NOTE — Progress Notes (Signed)
Called report to [facility].  Spoke with Caryl Pina, LPN. No unanswered questions.  Pick up with transport will be approx. 12pm today.  Facility aware.

## 2020-11-18 NOTE — Progress Notes (Signed)
IV removed.  Tip intact. New dressing placed on right posterior forearm skin tear.  One hard script copied and placed on chart.  Original placed in packet.

## 2020-11-18 NOTE — TOC Progression Note (Signed)
Transition of Care [facility]) - Progression Note    Patient Details  Name: [name] MRN: [id number] Date of Birth: August 17, 1945  Transition of Care [facility]) CM/SW Contact  Su Hilt, RN Phone Number: 11/18/2020, 8:39 AM  Clinical Narrative:     Received notification of the insurance approval, will DC to [facility] today       Expected Discharge Plan and Services           Expected Discharge Date: 11/18/20                                     Social Determinants of Health (SDOH) Interventions    Readmission Risk Interventions No flowsheet data found.

## 2020-11-19 LAB — VITAMIN A: Vitamin A (Retinoic Acid): 27.7 ug/dL (ref 22.0–69.5)

## 2020-12-03 NOTE — Telephone Encounter (Signed)
Noted  Plan to discuss at follow up  Sarah, please sch follow up appt, in person

## 2020-12-05 ENCOUNTER — Encounter (INDEPENDENT_AMBULATORY_CARE_PROVIDER_SITE_OTHER): Payer: Medicare HMO | Admitting: Ophthalmology

## 2020-12-06 ENCOUNTER — Telehealth: Payer: Self-pay | Admitting: Family

## 2020-12-06 NOTE — Telephone Encounter (Signed)
Patient scheduled for follow up. Being discharged from [facility] facility

## 2020-12-06 NOTE — Telephone Encounter (Signed)
Spoke with pt and she stated that she is still currently in rehab but should be discharged tomorrow. Pt stated that she will have to call back to schedule the follow up because she will be staying with her niece and will have to find out when she can bring her because pt is still currently unable to walk and has no transportation of her own.

## 2020-12-10 NOTE — Progress Notes (Deleted)
Subjective:    Patient ID: Stephani Facchini Sanabia, female    DOB: Apr 04, 1946, 75 y.o.   MRN: [id number]  CC: Awilda Ketterling Hird is a 75 y.o. female who presents today for follow up.   HPI: HPI  Anxiety  Zoloft '50mg'$   Dr Rockey Situ Repeat EKG in hospital, QTc borderline/better  See how she recovers from current sx.  Would be ok to make changes as you detailed,  Would check EKG a few weeks after med cahange  Thx  TGollan   IF IF zoloft was even okay with you, how do I ( how frequently) would I monitor for worsening of QT prolongation? I would presume to stay at lowest dose '50mg'$ ?   HISTORY:  Past Medical History:  Diagnosis Date   Abnormal Pap smear of cervix    Dr. Prentice Docker    Coronary artery disease    a. 06/2009 MI/PCI to LCX and LAD; b. 01/2011 Occluded stents-->CABG x 3 ([facility]): LIMA->LAD, VG->LCX, VG->RPDA; c. 01/2012 Cath: LM 50, LAD 95ost, 133m LCX 95ost, 1061mRCA 3052mPDA small, LIMA->LAD ok, VG->LCX ok, VG->RPDA 100-->Med rx.   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 01/2010 s/p Aortofemoral bypass.   Past Surgical History:  Procedure Laterality Date   CARDIAC [facility]ATION  [facility]     COLONOSCOPY     COLPOSCOPY     CORONARY ARTERY BYPASS GRAFT  02/07/2010   x 3   ERCP N/A 10/05/2019   Procedure: ENDOSCOPIC RETROGRADE CHOLANGIOPANCREATOGRAPHY (ERCP);  Surgeon: WohLucilla LameD;  [facility];  Service: Endoscopy;  Laterality: N/A;   FACIAL COSMETIC SURGERY  2013   Dr. MadNadeen LandauFOOT SURGERY     right   ILIO-FEMORAL BYPASS GRAFT  2011   LIVER BIOPSY N/A 10/31/2019    Procedure: LIVER BIOPSY;  Surgeon: PisOlean ReeD;  Location: [facility] ORS;  Service: General;  Laterality: N/A;   MITRAL VALVE REPAIR  01/2010   ORIF ANKLE FRACTURE Right 10/08/2016   Procedure: OPEN REDUCTION INTERNAL FIXATION (ORIF) ANKLE FRACTURE;  Surgeon: PogCorky MullD;  Location: [facility] ORS;  Service: Orthopedics;  Laterality: Right;   ORIF HUMERUS FRACTURE Right 12/20/2019   Procedure: OPEN REDUCTION INTERNAL FIXATION (ORIF) DISTAL HUMERUS FRACTURE;  Surgeon: HadShona NeedlesD;  [facility]  Laterality: Right;   ORIF WRIST FRACTURE Right 12/29/2018   Procedure: OPEN REDUCTION INTERNAL FIXATION (ORIF) WRIST FRACTURE;  Surgeon: PogCorky MullD;  Location: [facility] ORS;  Service: Orthopedics;  Laterality: Right;   PARATHYROIDECTOMY     TUBAL LIGATION     Family History  Problem Relation Age of Onset   Heart disease Father     Allergies: Patient has no known allergies. Current Outpatient Medications on File Prior to Visit  Medication Sig Dispense Refill   aspirin 81 MG EC tablet Take 81 mg by mouth daily.       calcium carbonate (TUMS - DOSED IN MG ELEMENTAL CALCIUM) 500 MG chewable tablet Chew 1 tablet (  200 mg of elemental calcium total) by mouth 2 (two) times daily with a meal.     Cholecalciferol (VITAMIN D3) 1000 UNITS CAPS Take 1,000 Units by mouth daily.     cyclobenzaprine (FLEXERIL) 5 MG tablet Take 1 tablet (5 mg total) by mouth 3 (three) times daily as needed for muscle spasms. 20 tablet 0   doxylamine, Sleep, (SLEEP AID) 25 MG tablet Take 25 mg by mouth at bedtime as needed.      ezetimibe (ZETIA) 10 MG tablet Take 1 tablet (10 mg total) by mouth daily. 90 tablet 2   folic acid (FOLVITE) 1 MG tablet Take 5 tablets (5 mg total) by mouth daily.     furosemide (LASIX) 20 MG tablet Take 1 tablet (20 mg total) by mouth daily as needed. 30 tablet 1   gabapentin (NEURONTIN) 100 MG capsule Take 2 capsules (200 mg total) by mouth 3 (three) times daily. 90  capsule 3   metoprolol tartrate (LOPRESSOR) 25 MG tablet Take 0.5 tablets (12.5 mg total) by mouth 2 (two) times daily. 90 tablet 3   Multiple Vitamins-Minerals (PRESERVISION AREDS 2 PO) Take 1 capsule by mouth 2 (two) times daily.     pantoprazole (PROTONIX) 40 MG tablet Take 1 tablet (40 mg total) by mouth daily. 90 tablet 4   Probiotic Product (RISA-BID PROBIOTIC PO) Take 250 mg by mouth daily.      rosuvastatin (CRESTOR) 10 MG tablet Take 1 tablet (10 mg total) by mouth daily. 90 tablet 0   sertraline (ZOLOFT) 100 MG tablet Take 100 mg by mouth daily.     vitamin B-12 (CYANOCOBALAMIN) 1000 MCG tablet Take 1,000 mcg by mouth daily.     No current facility-administered medications on file prior to visit.    Social History   Tobacco Use   Smoking status: Former    Packs/day: 1.00    Years: 44.00    Pack years: 44.00    Types: Cigarettes    Quit date: 07/15/2009    Years since quitting: 11.4   Smokeless tobacco: Never   Tobacco comments:    quit 06/2009 smoked from 4s to 2011 1.5 ppd no FH lung cancer   Vaping Use   Vaping Use: Never used  Substance Use Topics   Alcohol use: Yes    Alcohol/week: 1.0 standard drink    Types: 1 Standard drinks or equivalent per week    Comment: occ.   Drug use: No    Review of Systems    Objective:    There were no vitals taken for this visit. BP Readings from Last 3 Encounters:  11/18/20 (!) 94/49  09/20/20 (!) 104/52  08/06/20 97/60   Wt Readings from Last 3 Encounters:  11/07/20 110 lb 3.7 oz (50 kg)  09/30/20 116 lb (52.6 kg)  08/16/20 116 lb (52.6 kg)    Physical Exam     Assessment & Plan:   Problem List Items Addressed This Visit   None    I am having Beverly Gust. Hippe maintain her aspirin, Vitamin D3, Probiotic Product (RISA-BID PROBIOTIC PO), Multiple Vitamins-Minerals (PRESERVISION AREDS 2 PO), Sleep Aid, cyclobenzaprine, pantoprazole, furosemide, vitamin B-12, rosuvastatin, metoprolol tartrate, ezetimibe, sertraline,  gabapentin, calcium carbonate, and folic acid.   No orders of the defined types were placed in this encounter.   Return precautions given.   Risks, benefits, and alternatives of the medications and treatment plan prescribed today were discussed, and patient expressed understanding.   Education regarding symptom management and diagnosis  given to patient on AVS.  Continue to follow with Burnard Hawthorne, FNP for routine health maintenance.   Beverly Gust [place] and I agreed with plan.   [name], FNP

## 2020-12-11 ENCOUNTER — Other Ambulatory Visit: Payer: Self-pay

## 2020-12-11 ENCOUNTER — Encounter (INDEPENDENT_AMBULATORY_CARE_PROVIDER_SITE_OTHER): Payer: Medicare HMO | Admitting: Ophthalmology

## 2020-12-11 DIAGNOSIS — H35033 Hypertensive retinopathy, bilateral: Secondary | ICD-10-CM | POA: Diagnosis not present

## 2020-12-11 DIAGNOSIS — I10 Essential (primary) hypertension: Secondary | ICD-10-CM | POA: Diagnosis not present

## 2020-12-11 DIAGNOSIS — H43813 Vitreous degeneration, bilateral: Secondary | ICD-10-CM

## 2020-12-11 DIAGNOSIS — H353231 Exudative age-related macular degeneration, bilateral, with active choroidal neovascularization: Secondary | ICD-10-CM | POA: Diagnosis not present

## 2020-12-12 NOTE — Telephone Encounter (Signed)
Pt's niece Andee Poles [facility]) called and has questions about FL2 from [facility]. Pt has f/u scheduled 12/16/20

## 2020-12-13 NOTE — Telephone Encounter (Signed)
Patient niece Andee Poles called to check on the status of her FL2 form.Please call Danielle at [phone] to let her know if they can pick it up or receive it through [place].

## 2020-12-16 ENCOUNTER — Telehealth (INDEPENDENT_AMBULATORY_CARE_PROVIDER_SITE_OTHER): Payer: Medicare HMO | Admitting: Family

## 2020-12-16 ENCOUNTER — Encounter: Payer: Self-pay | Admitting: Family

## 2020-12-16 VITALS — Ht 60.0 in | Wt 106.0 lb

## 2020-12-16 DIAGNOSIS — G992 Myelopathy in diseases classified elsewhere: Secondary | ICD-10-CM | POA: Diagnosis not present

## 2020-12-16 DIAGNOSIS — D649 Anemia, unspecified: Secondary | ICD-10-CM | POA: Diagnosis not present

## 2020-12-16 DIAGNOSIS — E610 Copper deficiency: Secondary | ICD-10-CM | POA: Diagnosis not present

## 2020-12-16 DIAGNOSIS — G63 Polyneuropathy in diseases classified elsewhere: Secondary | ICD-10-CM | POA: Diagnosis not present

## 2020-12-16 MED ORDER — GABAPENTIN 300 MG PO CAPS: 300.0000 mg | ORAL_CAPSULE | Freq: Three times a day (TID) | ORAL | 3 refills | Status: DC

## 2020-12-16 NOTE — Assessment & Plan Note (Signed)
Hospitalization course reviewed in detail.  Medications reconciled.  Continue to advise not to take any over-the-counter supplement with zinc as it interferes with copper absorption.  She will remain compliant with B12, and folate.  Referral placed to [facility] neurology for second opinion as it relates to peripheral neuropathy.  FL 2 form completed for patient so she can be accepted into assisted living.  Close follow-up in 6 weeks time.

## 2020-12-16 NOTE — Progress Notes (Signed)
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   Names of all persons present for services: [name], NP and patient  Hospitalization follow-up, complete [place] 2 form  She had been at [facility] for 20 days. She completed PT at [facility] and describes walking using walker, stationary bike. Strength improved however has worsened since moving in with niece and not having PT.   She is using walker, wheelchair. She is using rolling walker most often and she is not walking at all.  She is unstable . She has fallen in the last couple of days while holding onto the towel rack. She slide down and landed her button onto linoleum floor. No LOC, head injury.   She is living with her niece for the past 9 days.   She is able use her arms. No upper extremity weakness or numbness.  She able to get dressed, partially bathe herself. Using a shower chair.  She is not using supplements with zinc currently.  Taking oxycodone '10mg'$  qhs for low back pain as prescribed by orthopedic  Weston Anna. Compliant with gabapentin '200mg'$  TID. She doesn't feel sedated or woozy. She would like to increase gabapentin due to peripheral neuropathy.    Bilateral leg numbness from toes to ankles.   She would like [place] 2 form completed for [employer] .   Presented to ED 11/07/20 for bilateral arms and legs numbness and weakness.  Admitted for ataxia, hypovolemic hyponatremia, anemia. She was discharged 11/18/2020 to [facility]. At discharge, it was recommended that she consider visiting research center for more in-depth evaluation of copper deficiency such as [facility] or [facility].  Hospital course was notable that she was unable to ambulate at all.  She was started on IV copper infusion after neurology recommendations.  Copper infusions completed 11/13/2020.  Noted the presentation is inconsistent with Wilson's disease.   Advised discussion of further GI evaluation for impaired copper absorption.  Celiac screen, intrinsic factor , Rheumatoid factor negative 11/13/20 IV venofer in hospital.  B12 951 11/09/20 Copper 11 Continue gabapentin '200mg'$  TID,  B12 1000 mcg daily and folic acid '1mg'$  supplementation Hemoglobin 7.6  Ferritin 100 CT head 11/07/2020 showed no acute intracranial abnormality, no acute displaced fracture, or listhesis of the cervical spine.  Multilevel degenerative changes MRI brain shows no evidence of acute intracranial abnormality.  Mild to moderate chronic small vessel ischemic changes.  Chronic lacunar infarct.  Multiple small chronic infarcts. MRI cervical spine shows hyperintense signal abnormality findings are nonspecific differential considerations including B12 deficiency combined with comfort deficiency, syphilis.  Cervical spine cheilosis.  C2-C3 , c3-c4 , c4-c5 multifactor severe spinal canal stenosis. MRI thoracic spine also shows hyperintense signal abnormality.  Thoracic spondylosis and disc bulge T6-T7.  Mild relative spinal canal narrowing.  Fusion across T5-T6 and T6-T7 MRI lumbar negative for acute fracture.  Central canal is patent at all levels with mild multilevel treatment disc disease.  Mild right narrowing L4-5.   Anemia- compliant with iron. Last colonoscopy was 11/04/2012, Dr. Vira Agar with  Diverticulosis, internal hemorrhoids noted.  Advised to repeat in 10 years  Anxiety- she is not on zoloft '100mg'$ . She is not bothered by anxiety at this time.    Dr Curly Shores, 11/13/20 neurology consult Dr Curly Shores ; suspected with possible examination for copper deficiency she is taking zinc which competes for absorption.  Advised her to avoid supplements  with zinc.    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  Medications reconciled.  Continue to  advise not to take any over-the-counter supplement with zinc as it interferes with copper absorption.  She will remain compliant with B12, and folate.  Referral placed to [facility] neurology for second opinion as it relates to peripheral neuropathy.  FL 2 form completed for patient so she can be accepted into assisted living.  Close follow-up in 6 weeks time.      Relevant Medications   gabapentin (NEURONTIN) 300 MG capsule   Other Relevant Orders   Ambulatory referral to Gastroenterology   Ambulatory referral to Neurology   CBC with Differential/Platelet   IBC + Ferritin   Comprehensive metabolic panel     Other   Anemia    Pending cbc, iron stores. Referral to GI for further evaluation as it relates to mild junctional coffer, full anemia work-up with consideration for endoscopy, capsule study, repeat colonoscopy.      Relevant Orders   Ambulatory referral to Gastroenterology   CBC with Differential/Platelet   IBC + Ferritin     I spent 25 min  discussing plan of care over the phone.

## 2020-12-16 NOTE — Assessment & Plan Note (Signed)
Pending cbc, iron stores. Referral to GI for further evaluation as it relates to mild junctional coffer, full anemia work-up with consideration for endoscopy, capsule study, repeat colonoscopy.

## 2020-12-18 ENCOUNTER — Encounter: Payer: Self-pay | Admitting: Family

## 2021-01-07 ENCOUNTER — Other Ambulatory Visit: Payer: Self-pay | Admitting: Family

## 2021-01-15 ENCOUNTER — Encounter (INDEPENDENT_AMBULATORY_CARE_PROVIDER_SITE_OTHER): Payer: Self-pay

## 2021-01-15 ENCOUNTER — Other Ambulatory Visit: Payer: Self-pay

## 2021-01-15 ENCOUNTER — Encounter (INDEPENDENT_AMBULATORY_CARE_PROVIDER_SITE_OTHER): Payer: Medicare HMO | Admitting: Ophthalmology

## 2021-01-24 ENCOUNTER — Other Ambulatory Visit: Payer: Self-pay

## 2021-01-24 ENCOUNTER — Telehealth: Payer: Self-pay

## 2021-01-24 ENCOUNTER — Encounter (INDEPENDENT_AMBULATORY_CARE_PROVIDER_SITE_OTHER): Payer: Medicare HMO | Admitting: Ophthalmology

## 2021-01-24 DIAGNOSIS — H353231 Exudative age-related macular degeneration, bilateral, with active choroidal neovascularization: Secondary | ICD-10-CM

## 2021-01-24 NOTE — Telephone Encounter (Signed)
Confirmed signed and faxed [place] home health order form. Form has been sent to scan.

## 2021-02-17 ENCOUNTER — Ambulatory Visit: Payer: Medicare HMO | Admitting: Gastroenterology

## 2021-02-28 ENCOUNTER — Encounter (INDEPENDENT_AMBULATORY_CARE_PROVIDER_SITE_OTHER): Payer: Medicare HMO | Admitting: Ophthalmology

## 2021-03-04 ENCOUNTER — Ambulatory Visit: Payer: Medicare HMO

## 2021-03-05 ENCOUNTER — Other Ambulatory Visit: Payer: Self-pay

## 2021-03-05 ENCOUNTER — Encounter (INDEPENDENT_AMBULATORY_CARE_PROVIDER_SITE_OTHER): Payer: Medicare HMO | Admitting: Ophthalmology

## 2021-03-05 DIAGNOSIS — I10 Essential (primary) hypertension: Secondary | ICD-10-CM | POA: Diagnosis not present

## 2021-03-05 DIAGNOSIS — H35033 Hypertensive retinopathy, bilateral: Secondary | ICD-10-CM

## 2021-03-05 DIAGNOSIS — H43813 Vitreous degeneration, bilateral: Secondary | ICD-10-CM

## 2021-03-05 DIAGNOSIS — H353231 Exudative age-related macular degeneration, bilateral, with active choroidal neovascularization: Secondary | ICD-10-CM | POA: Diagnosis not present

## 2021-04-10 ENCOUNTER — Encounter (INDEPENDENT_AMBULATORY_CARE_PROVIDER_SITE_OTHER): Payer: Medicare HMO | Admitting: Ophthalmology

## 2021-04-11 ENCOUNTER — Ambulatory Visit: Payer: Medicare HMO

## 2021-04-22 ENCOUNTER — Encounter (INDEPENDENT_AMBULATORY_CARE_PROVIDER_SITE_OTHER): Payer: Medicare HMO | Admitting: Ophthalmology

## 2021-04-22 ENCOUNTER — Other Ambulatory Visit: Payer: Self-pay

## 2021-04-22 DIAGNOSIS — I10 Essential (primary) hypertension: Secondary | ICD-10-CM

## 2021-04-22 DIAGNOSIS — H35033 Hypertensive retinopathy, bilateral: Secondary | ICD-10-CM | POA: Diagnosis not present

## 2021-04-22 DIAGNOSIS — H353231 Exudative age-related macular degeneration, bilateral, with active choroidal neovascularization: Secondary | ICD-10-CM

## 2021-04-22 DIAGNOSIS — H43813 Vitreous degeneration, bilateral: Secondary | ICD-10-CM | POA: Diagnosis not present

## 2021-04-23 ENCOUNTER — Ambulatory Visit (INDEPENDENT_AMBULATORY_CARE_PROVIDER_SITE_OTHER): Payer: Medicare HMO

## 2021-04-23 VITALS — Ht 60.0 in | Wt 106.0 lb

## 2021-04-23 DIAGNOSIS — Z0000 Encounter for general adult medical examination without abnormal findings: Secondary | ICD-10-CM

## 2021-04-23 NOTE — Progress Notes (Addendum)
Subjective:   [name] is a 76 y.o. female who presents for Medicare Annual (Subsequent) preventive examination.  Review of Systems    No ROS.  Medicare Wellness Virtual Visit.  Visual/audio telehealth visit, UTA vital signs.   See social history for additional risk factors.   Cardiac Risk Factors include: advanced age (>63men, >6 women)     Objective:    Today's Vitals   04/23/21 1117  Weight: 106 lb (48.1 kg)  Height: 5' (1.524 m)   Body mass index is 20.7 kg/m.  Advanced Directives 04/23/2021 11/07/2020 03/01/2020 10/31/2019 10/18/2019 10/05/2019 03/01/2019  Does Patient Have a Medical Advance Directive? Yes No No No No No Yes  Type of Advance Directive [place]  Does patient want to make changes to medical advance directive? - - Yes (MAU/Ambulatory/Procedural Areas - Information given) - - - No - Patient declined  Copy of [place] in Chart? No - copy requested - - - - - No - copy requested  Would patient like information on creating a medical advance directive? - No - Patient declined - No - Patient declined No - Patient declined - -    Current Medications (verified) Outpatient Encounter Medications as of 04/23/2021  Medication Sig   aspirin 81 MG EC tablet Take 81 mg by mouth daily.     calcium carbonate (TUMS - DOSED IN MG ELEMENTAL CALCIUM) 500 MG chewable tablet Chew 1 tablet (200 mg of elemental calcium total) by mouth 2 (two) times daily with a meal.   Cholecalciferol (VITAMIN D3) 1000 UNITS CAPS Take 1,000 Units by mouth daily.   cyclobenzaprine (FLEXERIL) 5 MG tablet Take 1 tablet (5 mg total) by mouth 3 (three) times daily as needed for muscle spasms.   doxylamine, Sleep, (SLEEP AID) 25 MG tablet Take 25 mg by mouth at bedtime as needed.    ezetimibe (ZETIA) 10 MG tablet Take 1 tablet (10 mg total) by mouth daily.   folic acid (FOLVITE) 1 MG tablet Take 5 tablets (5 mg total) by mouth  daily.   furosemide (LASIX) 20 MG tablet Take 1 tablet (20 mg total) by mouth daily as needed.   gabapentin (NEURONTIN) 300 MG capsule Take 1 capsule (300 mg total) by mouth 3 (three) times daily.   metoprolol tartrate (LOPRESSOR) 25 MG tablet Take 0.5 tablets (12.5 mg total) by mouth 2 (two) times daily.   Multiple Vitamins-Minerals (PRESERVISION AREDS 2 PO) Take 1 capsule by mouth 2 (two) times daily.   Oxycodone HCl 10 MG TABS Take by mouth.   pantoprazole (PROTONIX) 40 MG tablet Take 1 tablet (40 mg total) by mouth daily.   Probiotic Product (RISA-BID PROBIOTIC PO) Take 250 mg by mouth daily.    rosuvastatin (CRESTOR) 10 MG tablet TAKE 1 TABLET BY MOUTH AT BEDTIME   vitamin B-12 (CYANOCOBALAMIN) 1000 MCG tablet Take 1,000 mcg by mouth daily.   No facility-administered encounter medications on file as of 04/23/2021.    Allergies (verified) Patient has no known allergies.   History: Past Medical History:  Diagnosis Date   Abnormal Pap smear of cervix    Dr. Prentice Docker    Coronary artery disease    a. 06/2009 MI/PCI to LCX and LAD; b. 01/2011 Occluded stents-->CABG x 3 ([facility]): LIMA->LAD, VG->LCX, VG->RPDA; c. 01/2012 Cath: LM 50, LAD 95ost, 140m, LCX 95ost, 136m, RCA 91m, RPDA small, LIMA->LAD ok, VG->LCX ok, VG->RPDA 100-->Med rx.  Degenerative disc disease, lumbar    L4/5 noted CT ab/pelvis 05/19/11    DVT (deep venous thrombosis) ([place])    after childbirth age 52   GERD (gastroesophageal reflux disease)    Hyperlipidemia    Hypertension    under control   Macular degeneration    Myocardial infarction [facility]) 2011   Parathyroid adenoma    right 13 x 8 mm s/p resection    PVD (peripheral vascular disease) ([place])    a. 01/2010 s/p Aortofemoral bypass.   Past Surgical History:  Procedure Laterality Date   CARDIAC CATHETERIZATION  2013   [facility]   CATARACT EXTRACTION     COLONOSCOPY     COLPOSCOPY     CORONARY ARTERY BYPASS GRAFT  02/07/2010   x 3   ERCP N/A 10/05/2019    Procedure: ENDOSCOPIC RETROGRADE CHOLANGIOPANCREATOGRAPHY (ERCP);  Surgeon: Lucilla Lame, MD;  Location: [facility] ENDOSCOPY;  Service: Endoscopy;  Laterality: N/A;   FACIAL COSMETIC SURGERY  2013   Dr. Nadeen Landau   FOOT SURGERY     right   ILIO-FEMORAL BYPASS GRAFT  2011   LIVER BIOPSY N/A 10/31/2019   Procedure: LIVER BIOPSY;  Surgeon: Olean Ree, MD;  Location: [facility] ORS;  Service: General;  Laterality: N/A;   MITRAL VALVE REPAIR  01/2010   ORIF ANKLE FRACTURE Right 10/08/2016   Procedure: OPEN REDUCTION INTERNAL FIXATION (ORIF) ANKLE FRACTURE;  Surgeon: Corky Mull, MD;  Location: [facility] ORS;  Service: Orthopedics;  Laterality: Right;   ORIF HUMERUS FRACTURE Right 12/20/2019   Procedure: OPEN REDUCTION INTERNAL FIXATION (ORIF) DISTAL HUMERUS FRACTURE;  Surgeon: Shona Needles, MD;  Location: [place];  Service: Orthopedics;  Laterality: Right;   ORIF WRIST FRACTURE Right 12/29/2018   Procedure: OPEN REDUCTION INTERNAL FIXATION (ORIF) WRIST FRACTURE;  Surgeon: Corky Mull, MD;  Location: [facility] ORS;  Service: Orthopedics;  Laterality: Right;   PARATHYROIDECTOMY     TUBAL LIGATION     Family History  Problem Relation Age of Onset   Heart disease Father    Social History   Socioeconomic History   Marital status: Single    Spouse name: Not on file   Number of children: Not on file   Years of education: Not on file   Highest education level: Not on file  Occupational History   Not on file  Tobacco Use   Smoking status: Former    Packs/day: 1.00    Years: 44.00    Pack years: 44.00    Types: Cigarettes    Quit date: 07/15/2009    Years since quitting: 11.7   Smokeless tobacco: Never   Tobacco comments:    quit 06/2009 smoked from 26s to 2011 1.5 ppd no FH lung cancer   Vaping Use   Vaping Use: Never used  Substance and Sexual Activity   Alcohol use: Yes    Alcohol/week: 1.0 standard drink    Types: 1 Standard drinks or equivalent per week    Comment: occ.   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    Exercise- walks daily , wears fit bit.    Social Determinants of Health   Financial Resource Strain: Low Risk    Difficulty of Paying Living Expenses: Not hard at all  Food Insecurity: No Food Insecurity   Worried About Charity fundraiser in the Last Year: Never true   Ran Out of Food in the Last Year: Never true  Transportation Needs: No Transportation Needs   Lack of Transportation (Medical): No   Lack of Transportation (Non-Medical): No  Physical Activity: Sufficiently Active   Days of Exercise per Week: 5 days   Minutes of Exercise per Session: 30 min  Stress: No Stress Concern Present   Feeling of Stress : Not at all  Social Connections: Unknown   Frequency of Communication with Friends and Family: More than three times a week   Frequency of Social Gatherings with Friends and Family: More than three times a week   Attends Religious Services: Not on file   Active Member of Clubs or Organizations: Not on file   Attends Archivist Meetings: Not on file   Marital Status: Not on file    Tobacco Counseling Counseling given: Not Answered Tobacco comments: quit 06/2009 smoked from 107s to 2011 1.5 ppd no FH lung cancer    Clinical Intake:              How often do you need to have someone help you when you read instructions, pamphlets, or other written materials from your doctor or pharmacy?: 1 - Never   Interpreter Needed?: No      Activities of Daily Living In your present state of health, do you have any difficulty performing the following activities: 04/23/2021 11/08/2020  Hearing? N N  Vision? N N  Difficulty concentrating or making decisions? N N  Walking or climbing stairs? Y Y  Dressing or bathing? N N   Doing errands, shopping? Y N  Preparing Food and eating ? Y -  Using the Toilet? N -  In the past six months, have you accidently leaked urine? Y -  Comment Managed with daily brief -  Do you have problems with loss of bowel control? N -  Managing your Medications? Y -  Housekeeping or managing your Housekeeping? Y -  Some recent data might be hidden    Patient Care Team: Burnard Hawthorne, FNP as PCP - General (Family Medicine) Minna Merritts, MD as PCP - Cardiology (Cardiology)  Indicate any recent Medical Services you may have received from other than [facility] providers in the past year (date may be approximate).     Assessment:   This is a routine wellness examination for [name].  Virtual Visit via Telephone Note  I connected with  [name] on 04/23/21 at 11:15 AM EST by telephone and verified that I am speaking with the correct person using two identifiers.  Persons participating in the virtual visit: patient/Nurse Health Advisor   I discussed the limitations, risks, security and privacy concerns of performing an evaluation and management service by telephone and the availability of in person appointments. The patient expressed understanding and agreed to proceed.  Interactive audio and video telecommunications were attempted between this nurse and patient, however failed, due to patient having technical difficulties OR patient did not have access to video capability.  We continued and completed visit with audio only.  Some vital signs may be absent or patient reported.   Hearing/Vision screen Hearing Screening - Comments:: Patient is able to hear conversational tones without difficulty.  No issues reported.  Dietary issues and exercise activities discussed: Current Exercise Habits: Structured exercise class (Physical therapy), Frequency (Times/Week): 5, Intensity: Mild   Goals Addressed  This Visit's Progress     Patient Stated     "I want to  maintain" (pt-stated)        Stay active Stay hydrated Healthy diet       Depression Screen PHQ 2/9 Scores 04/23/2021 12/16/2020 06/03/2020 03/01/2020 10/27/2019 03/01/2019 12/07/2018  PHQ - 2 Score - 0 0 0 0 0 0  PHQ- 9 Score - - - - - - -  Exception Documentation (No Data) - - - - - -    Fall Risk Fall Risk  04/23/2021 12/16/2020 06/03/2020 03/01/2020 11/17/2019  Falls in the past year? 0 1 0 (No Data) 0  Comment - - - None since reported in August 2021. -  Number falls in past yr: 0 0 0 - -  Injury with Fall? 0 0 - - -  Comment - - - - -  Follow up Falls evaluation completed Falls evaluation completed Falls evaluation completed Falls evaluation completed -   ASSISTIVE DEVICES UTILIZED TO PREVENT FALLS: Use of a cane, walker or w/c? Yes  Grab bars in the bathroom? Yes  Shower chair or bench in shower? Yes  Elevated toilet seat or a handicapped toilet? Yes   TIMED UP AND GO: Was the test performed? No .   Cognitive Function:  Patient is alert and oriented x3.    6CIT Screen 03/01/2019  What Year? 0 points  What month? 0 points  What time? 0 points  Count back from 20 0 points  Months in reverse 0 points  Repeat phrase 0 points  Total Score 0    Immunizations Immunization History  Administered Date(s) Administered   19-influenza Whole 01/28/2017   Fluad Quad(high Dose 65+) 01/31/2019, 02/01/2021   Influenza Split 01/19/2012   Influenza-Unspecified 03/21/2010, 01/14/2011, 01/29/2012, 01/18/2013, 01/22/2014, 01/16/2015, 01/29/2016   PFIZER Comirnaty(Gray Top)Covid-19 Tri-Sucrose Vaccine 06/01/2019, 06/23/2019   PFIZER(Purple Top)SARS-COV-2 Vaccination 06/01/2019, 06/23/2019, 02/09/2020   Pneumococcal Conjugate-13 01/04/2014   Pneumococcal Polysaccharide-23 10/03/2008   Td 06/26/2010   Tdap 06/26/2010   Zoster, Live 11/19/2007   TDAP status: Due, Education has been provided regarding the importance of this vaccine. Advised may receive this vaccine at local pharmacy or  Health Dept. Aware to provide a copy of the vaccination record if obtained from local pharmacy or Health Dept. Verbalized acceptance and understanding. Deferred.   Flu Vaccine status: Up to date  Pneumococcal vaccine status: Due, Education has been provided regarding the importance of this vaccine. Advised may receive this vaccine at local pharmacy or Health Dept. Aware to provide a copy of the vaccination record if obtained from local pharmacy or Health Dept. Verbalized acceptance and understanding. Deferred.   Shingrix Completed?: No.    Education has been provided regarding the importance of this vaccine. Patient has been advised to call insurance company to determine out of pocket expense if they have not yet received this vaccine. Advised may also receive vaccine at local pharmacy or Health Dept. Verbalized acceptance and understanding.  Screening Tests Health Maintenance  Topic Date Due   Zoster Vaccines- Shingrix (1 of 2) 07/22/2021 (Originally 01/29/1965)   Pneumonia Vaccine 66+ Years old (7 - PPSV23 if available, else PCV20) 04/23/2022 (Originally 01/05/2015)   TETANUS/TDAP  04/23/2022 (Originally 06/25/2020)   MAMMOGRAM  04/15/2022   COLONOSCOPY (Pts 45-90yrs Insurance coverage will need to be confirmed)  11/05/2022   INFLUENZA VACCINE  Completed   DEXA SCAN  Completed   Hepatitis C Screening  Completed   HPV VACCINES  Aged Out   COVID-19  Vaccine  Discontinued   Health Maintenance There are no preventive care reminders to display for this patient.  Mammogram- deferred  Vision Screening: Recommended annual ophthalmology exams for early detection of glaucoma and other disorders of the eye.  Dental Screening: Recommended annual dental exams for proper oral hygiene  Community Resource Referral / Chronic Care Management: CRR required this visit?  No   CCM required this visit?  No      Plan:   Keep all routine maintenance appointments.   I have personally reviewed and noted  the following in the patients chart:   Medical and social history Use of alcohol, tobacco or illicit drugs  Current medications and supplements including opioid prescriptions. Patient taking opioid. Per patient followed by pain clinic, Dr. Redge Gainer. Functional ability and status Nutritional status Physical activity Advanced directives List of other physicians Hospitalizations, surgeries, and ER visits in previous 12 months Vitals Screenings to include cognitive, depression, and falls Referrals and appointments  In addition, I have reviewed and discussed with patient certain preventive protocols, quality metrics, and best practice recommendations. A written personalized care plan for preventive services as well as general preventive health recommendations were provided to patient.     Varney Biles, LPN   08/21/2990

## 2021-04-23 NOTE — Patient Instructions (Addendum)
°  Ms. Ortner , Thank you for taking time to come for your Medicare Wellness Visit. I appreciate your ongoing commitment to your health goals. Please review the following plan we discussed and let me know if I can assist you in the future.   These are the goals we discussed:  Goals       Patient Stated     "I want to maintain" (pt-stated)      Stay active Stay hydrated Healthy diet        This is a list of the screening recommended for you and due dates:  Health Maintenance  Topic Date Due   Zoster (Shingles) Vaccine (1 of 2) 07/22/2021*   Pneumonia Vaccine (3 - PPSV23 if available, else PCV20) 04/23/2022*   Tetanus Vaccine  04/23/2022*   Mammogram  04/15/2022   Colon Cancer Screening  11/05/2022   Flu Shot  Completed   DEXA scan (bone density measurement)  Completed   Hepatitis C Screening: USPSTF Recommendation to screen - Ages 18-79 yo.  Completed   HPV Vaccine  Aged Out   COVID-19 Vaccine  Discontinued  *Topic was postponed. The date shown is not the original due date.

## 2021-05-07 ENCOUNTER — Other Ambulatory Visit: Payer: Self-pay

## 2021-05-07 ENCOUNTER — Encounter (HOSPITAL_BASED_OUTPATIENT_CLINIC_OR_DEPARTMENT_OTHER): Payer: Self-pay | Admitting: Emergency Medicine

## 2021-05-07 ENCOUNTER — Emergency Department (HOSPITAL_BASED_OUTPATIENT_CLINIC_OR_DEPARTMENT_OTHER)
Admission: EM | Admit: 2021-05-07 | Discharge: 2021-05-08 | Disposition: A | Discharge status: Home / Self Care | Payer: Medicare HMO | Attending: Emergency Medicine | Admitting: Emergency Medicine

## 2021-05-07 ENCOUNTER — Emergency Department (HOSPITAL_BASED_OUTPATIENT_CLINIC_OR_DEPARTMENT_OTHER): Payer: Medicare HMO | Admitting: Radiology

## 2021-05-07 DIAGNOSIS — S99922A Unspecified injury of left foot, initial encounter: Secondary | ICD-10-CM | POA: Diagnosis present

## 2021-05-07 DIAGNOSIS — Z79899 Other long term (current) drug therapy: Secondary | ICD-10-CM | POA: Diagnosis not present

## 2021-05-07 DIAGNOSIS — X509XXA Other and unspecified overexertion or strenuous movements or postures, initial encounter: Secondary | ICD-10-CM | POA: Diagnosis not present

## 2021-05-07 DIAGNOSIS — S92532A Displaced fracture of distal phalanx of left lesser toe(s), initial encounter for closed fracture: Secondary | ICD-10-CM | POA: Insufficient documentation

## 2021-05-07 DIAGNOSIS — Z7982 Long term (current) use of aspirin: Secondary | ICD-10-CM | POA: Diagnosis not present

## 2021-05-07 DIAGNOSIS — S92412A Displaced fracture of proximal phalanx of left great toe, initial encounter for closed fracture: Secondary | ICD-10-CM | POA: Diagnosis not present

## 2021-05-07 DIAGNOSIS — S92902A Unspecified fracture of left foot, initial encounter for closed fracture: Secondary | ICD-10-CM

## 2021-05-07 NOTE — ED Triage Notes (Signed)
°  Patient brought in by EMS for L ankle pain that has been going on for 5 days.  Patient states she was doing "fall prevention" training and was getting on the floor.  Patient felt a pop in her L ankle and has swelling.  Patient states there was no fall and did not hit her head.  Pain 4/10, sore tender.

## 2021-05-08 DIAGNOSIS — S92412A Displaced fracture of proximal phalanx of left great toe, initial encounter for closed fracture: Secondary | ICD-10-CM | POA: Diagnosis not present

## 2021-05-08 IMAGING — DX DG ANKLE COMPLETE 3+V*L*
1 series · 3 of 3 positions shown · non-contrast
Comparison: None.

CLINICAL DATA: Left ankle pain for several days, initial encounter

EXAM:
LEFT ANKLE COMPLETE - 3+ VIEW

[Series 1: ankle · 0.14mm/px · 3 of 3 slices shown]
[im 1/3]
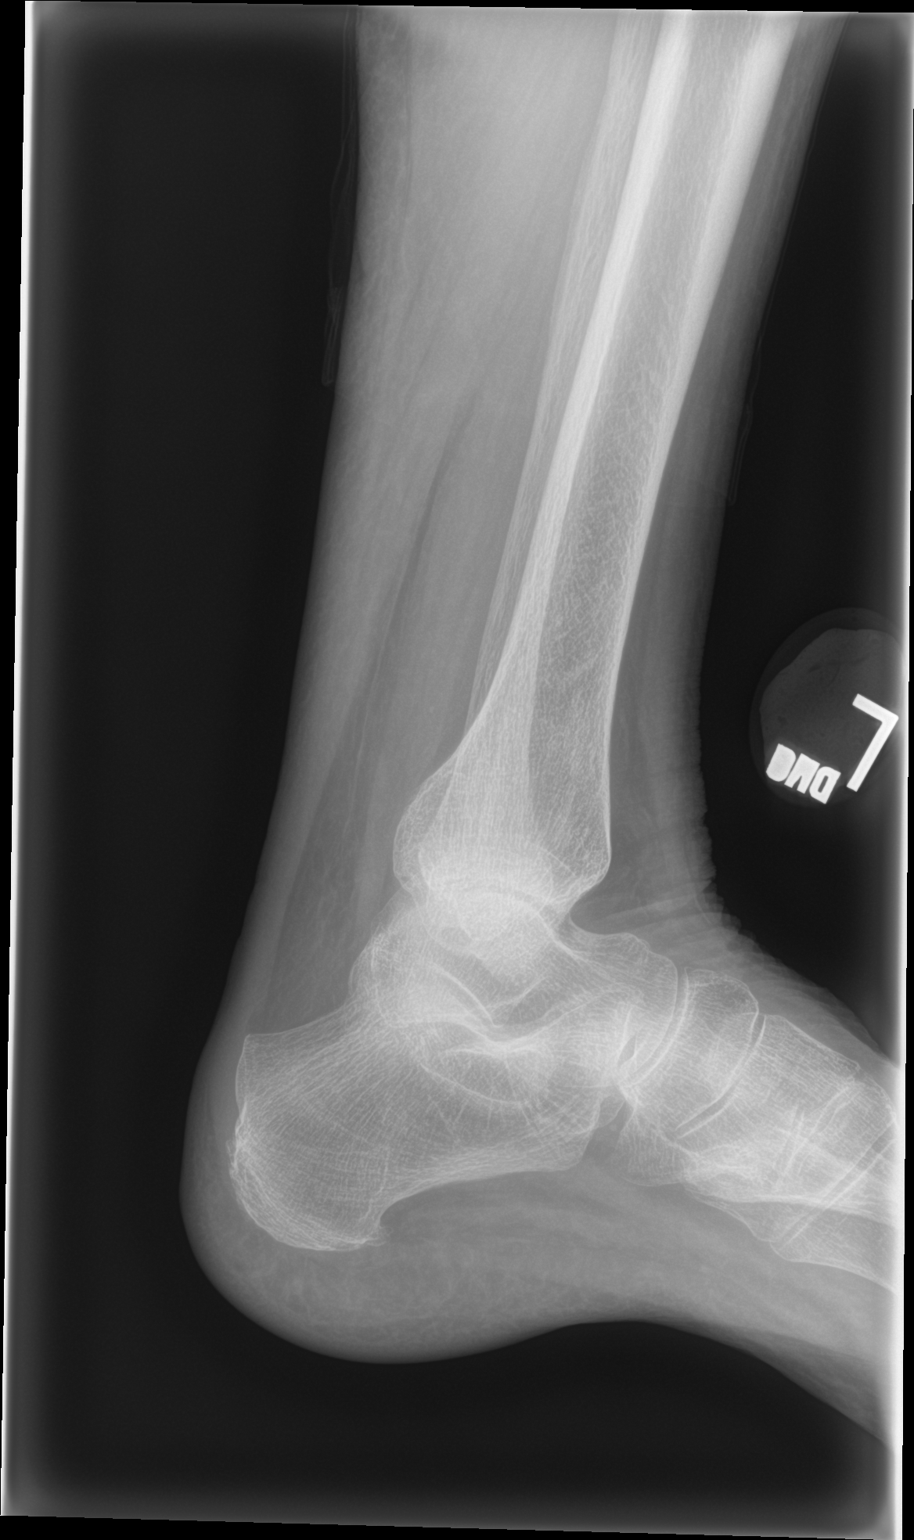
[im 2/3]
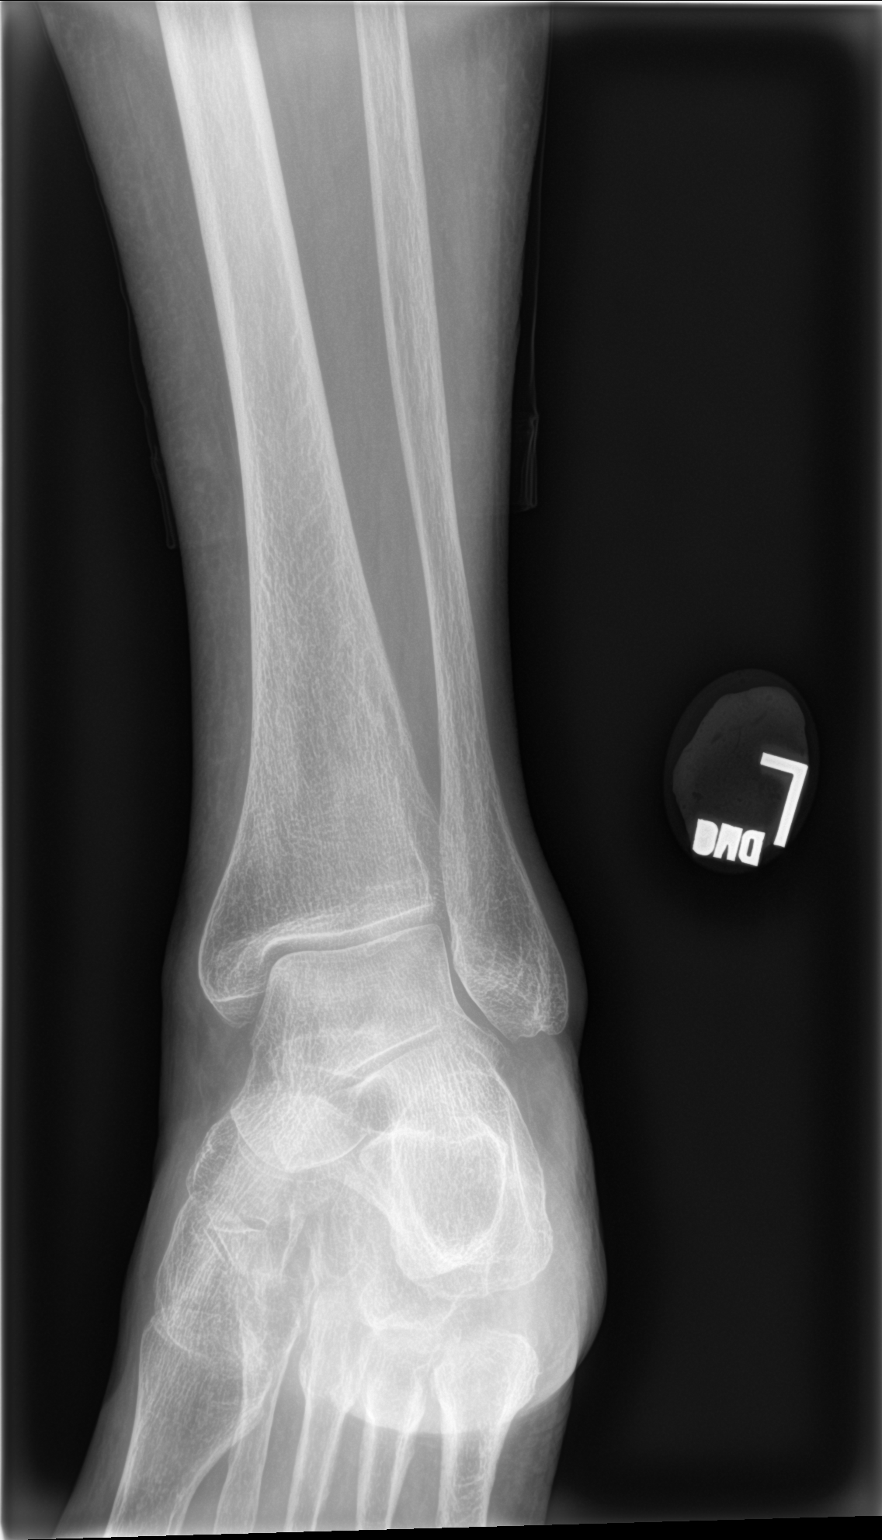
[im 3/3]
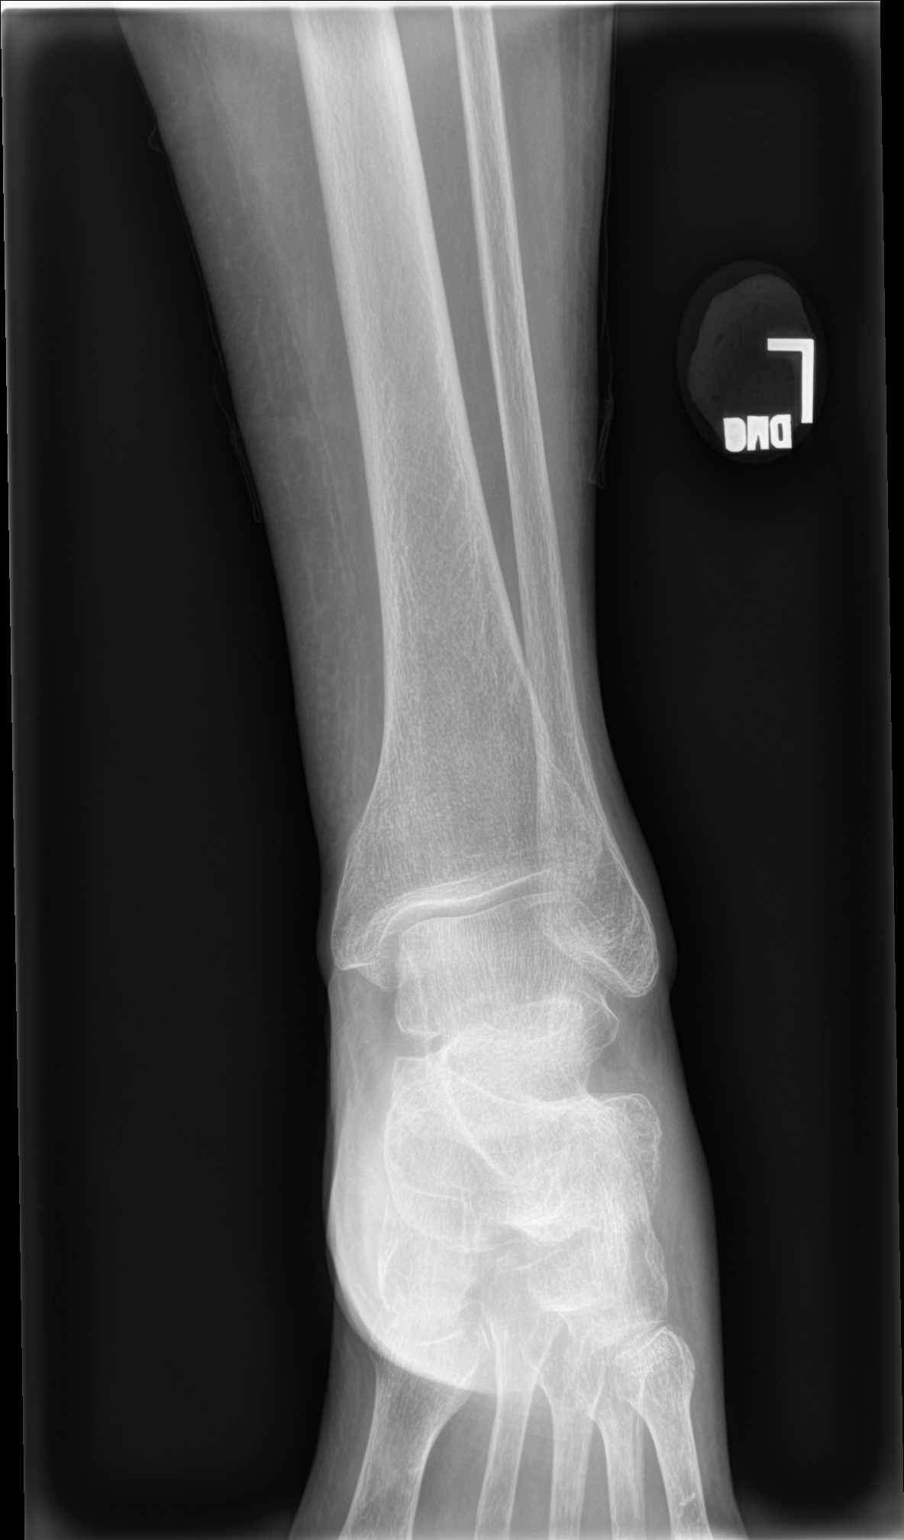

[3 of 3 positions shown; findings below may reference images not displayed]

FINDINGS: Small calcaneal spur is noted. No acute fracture or dislocation is
noted. No soft tissue abnormality is seen.
IMPRESSION: Acute abnormality noted.

## 2021-05-08 IMAGING — DX DG FOOT COMPLETE 3+V*L*
2 series · 3 of 3 positions shown · non-contrast
Comparison: None.

CLINICAL DATA: Left foot pain, no known injury, initial encounter

EXAM:
LEFT FOOT - COMPLETE 3+ VIEW

[Series 2: foot · 0.14mm/px · 2 of 2 slices shown]
[im 1/2]
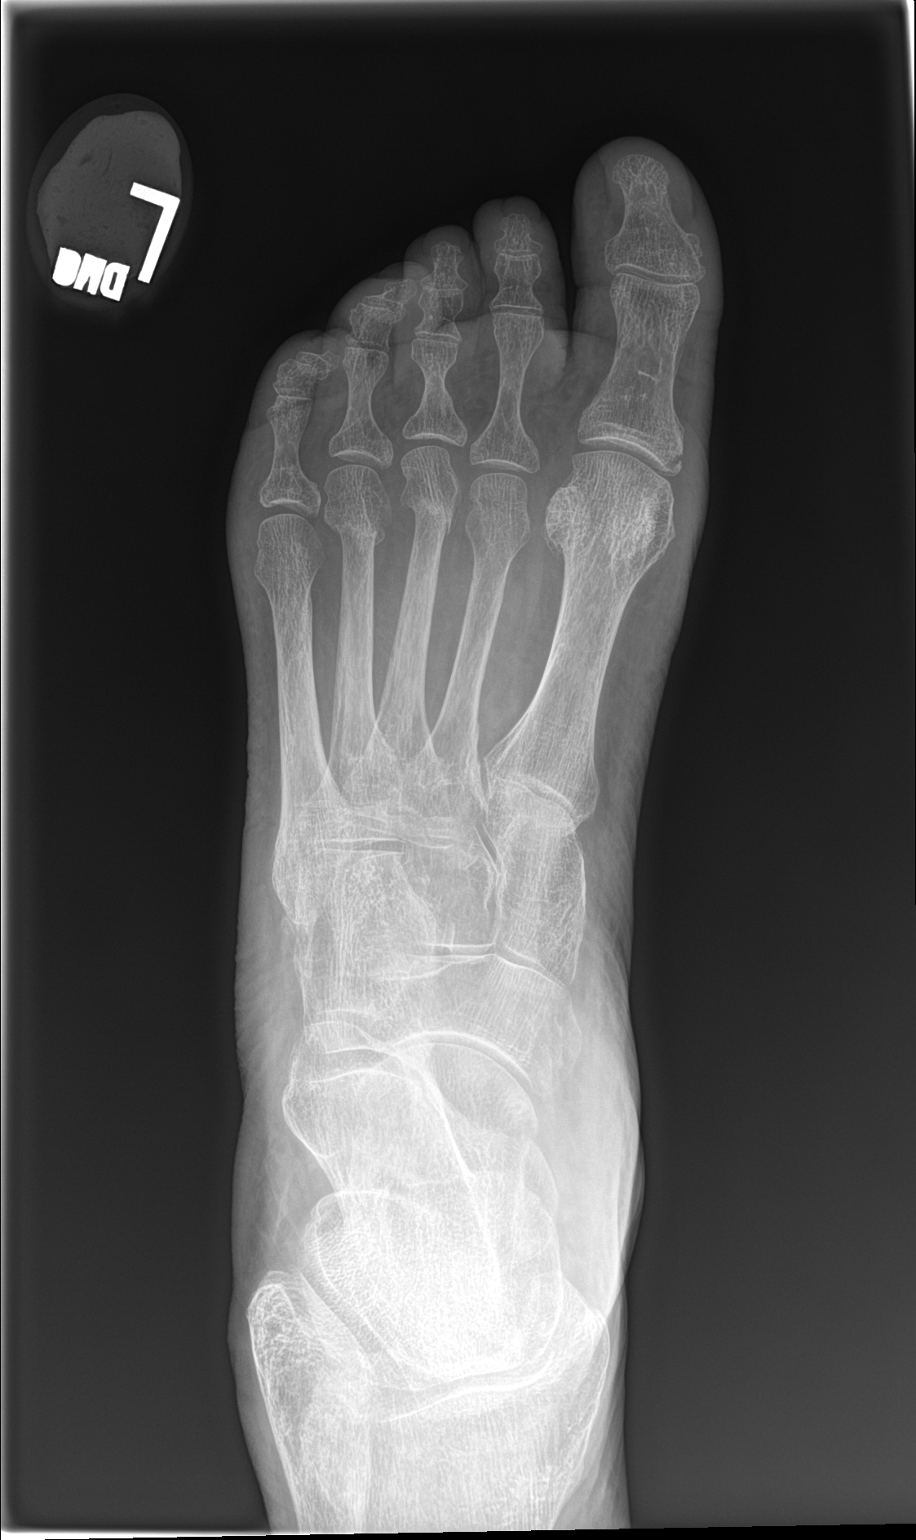
[im 2/2]
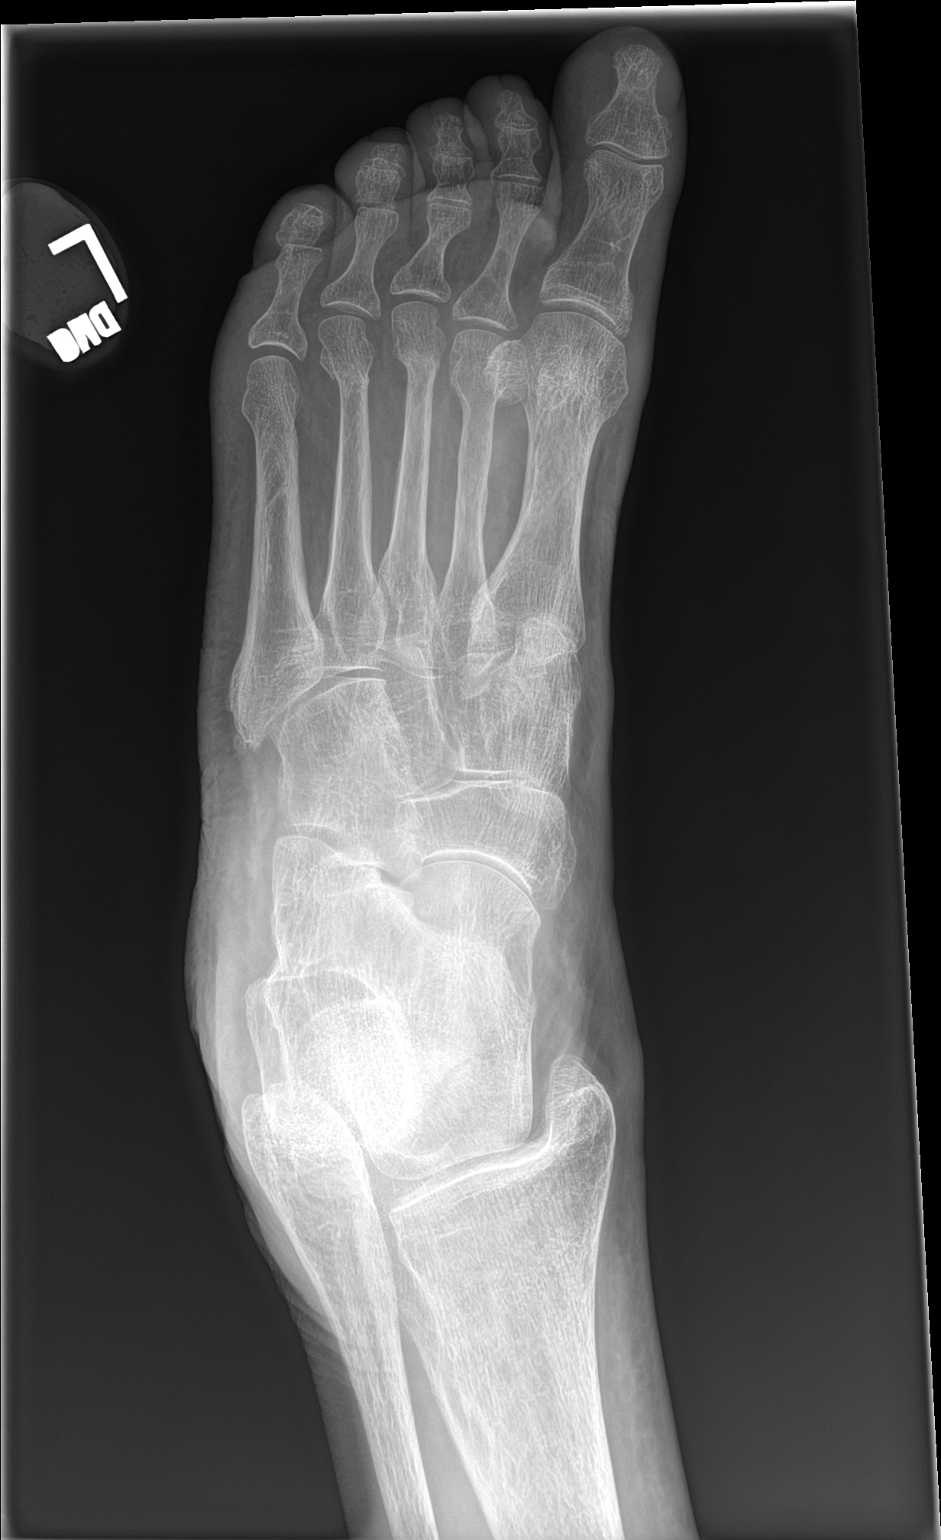

[leg]
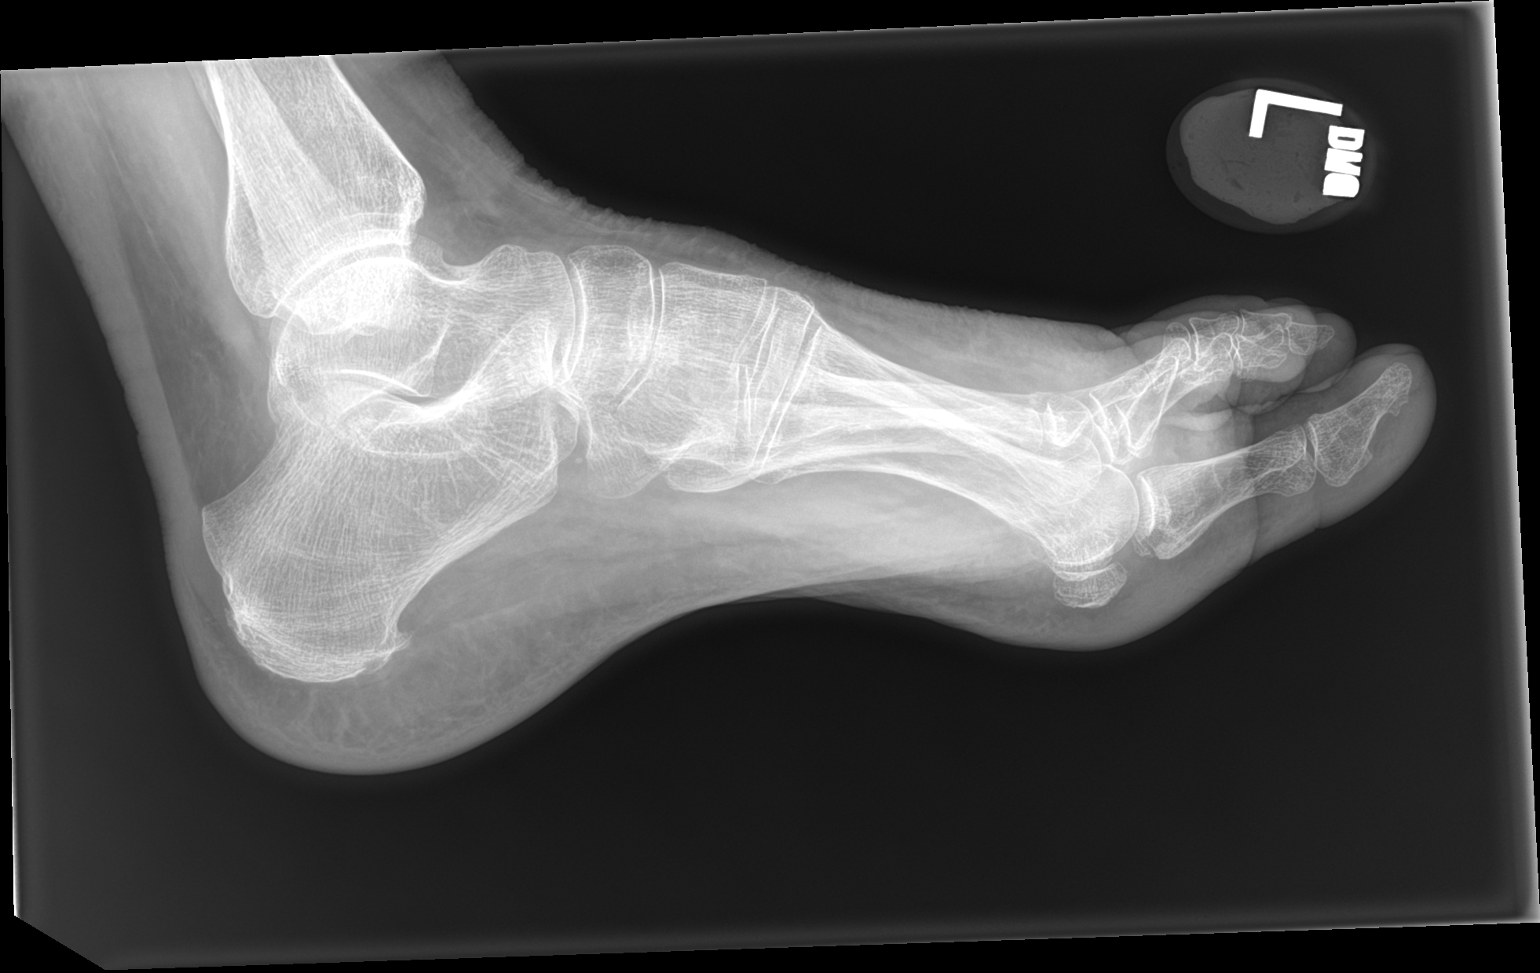

[3 of 3 positions shown; findings below may reference images not displayed]

FINDINGS: There is a fracture at the base of the first proximal phalanx
medially. Fractures in the distal third and fourth metatarsals are
noted with minimal displacement. No other fracture is noted. Mild
soft tissue swelling is noted distally. Small calcaneal spur is
seen.
IMPRESSION: Fractures at the base of the first proximal phalanx as well as the
distal aspect of the third and fourth metatarsals.
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
## 2021-05-08 NOTE — ED Notes (Signed)
Patient refused crutches. MD notified.

## 2021-05-26 ENCOUNTER — Encounter (INDEPENDENT_AMBULATORY_CARE_PROVIDER_SITE_OTHER): Payer: Medicare HMO | Admitting: Ophthalmology

## 2021-06-03 ENCOUNTER — Encounter (INDEPENDENT_AMBULATORY_CARE_PROVIDER_SITE_OTHER): Payer: Medicare HMO | Admitting: Ophthalmology

## 2021-06-03 ENCOUNTER — Other Ambulatory Visit: Payer: Self-pay

## 2021-06-03 DIAGNOSIS — H353231 Exudative age-related macular degeneration, bilateral, with active choroidal neovascularization: Secondary | ICD-10-CM | POA: Diagnosis not present

## 2021-06-03 DIAGNOSIS — H43813 Vitreous degeneration, bilateral: Secondary | ICD-10-CM

## 2021-06-03 DIAGNOSIS — H35033 Hypertensive retinopathy, bilateral: Secondary | ICD-10-CM

## 2021-06-03 DIAGNOSIS — I10 Essential (primary) hypertension: Secondary | ICD-10-CM | POA: Diagnosis not present

## 2021-07-08 LAB — HM MAMMOGRAPHY

## 2021-07-13 NOTE — Progress Notes (Signed)
Cardiology Office Note ? ?Date:  07/14/2021  ? ?ID:  [name], DOB [birth year], MRN [id number] ? ?PCP:  Burnard Hawthorne, FNP  ? ?Chief Complaint  ?Patient presents with  ? 12 month follow up   ?  "Doing well." Medications reviewed by the patient verbally.   ? ? ?HPI:  ?[name] is a pleasant 76 year old woman with a history of  ?coronary artery disease, ?stent to her left circumflex and 2 stents to the LAD in March of 2011, ?aorto femoral bypass grafting in early October 2011,   ?with occlusion of her coronary stents in mid-October requiring bypass surgery at [facility] in mid to late October 2012.  ?Cath 2013: occluded SVG to PDA ?thyroid surgery March 2014 ?long history of smoking though stopped in March of 2011 ?plastic surgery on her face in the past ?She presents for routine followup of her coronary artery disease. ? ?LOV 4/22 ?On prior office visits we had discussed her sedentary lifestyle, ?Radical weight loss ,missing meals ? anorexia, down more than 15 pounds ? 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 ?In follow-up today she reports having a difficult time, participating in rehab ?At [facility] doing therapy, getting stronger ?This weekend, moving into her own appt in [place] ? ?Followed by neurology for leg weakness ?Has had had 2 lumbar spine MRIs ?Multiple small chronic infarcts ? ?Chronic back pain ?Does not want surgery ? ?Presents today in a wheelchair ?Denies leg swelling, no shortness of breath no PND orthopnea ? ?EKG personally reviewed by myself on todays visit ?Normal sinus rhythm rate 68 bpm nonspecific ST abnormality ? ?Other past medical history reviewed ?Cardiac catheterization October 2013 ? ?Gallbladder attack April 2019 ?Declined surgery ? ? cardiac catheterization October 2013 showed LIMA to the LAD, vein graft to the  left circumflex, vein graft to the PDA which is occluded that supplies a very small area. Left main has 50% disease, ostial LAD has 95% in-stent restenosis, mid LAD has 100% disease, 75% ostial circumflex, 100% mid circumflex, very small OM1, 30% RCA disease, ejection fraction 45% ?  ?Stress test shows suboptimal images due to GI activity on resting images, significant anterior wall ischemia in the mid to distal segments, reduced ejection fraction of 47% ?  ? Prior ventricular arrhythmia when her stents were placed in the Cath Lab requiring shock.   ? echocardiogram improved, EF  greater than 40%. ?  ?Cardiac catheter report from July 15 2009  indicate she had a vision bare metal stent 2.75 x 28 mm in the LCX, mini vision bare metal stent 2.5 x 28 and 2.5 x 15 mm stent to the LAD. ?  ? DJD in her neck and back ? ?PMH:   has a past medical history of Abnormal Pap smear of cervix, Coronary artery disease, Degenerative disc disease, lumbar, DVT (deep venous thrombosis) ([place]), GERD (gastroesophageal reflux disease), Hyperlipidemia, Hypertension, Macular degeneration, Myocardial infarction ([place]) (2011), Parathyroid adenoma, and PVD (peripheral vascular disease) ([place]). ? ?PSH:    ?Past Surgical History:  ?Procedure Laterality Date  ? CARDIAC CATHETERIZATION  2013  ? [place]  ? CATARACT EXTRACTION    ? COLONOSCOPY    ? COLPOSCOPY    ? CORONARY ARTERY BYPASS GRAFT  02/07/2010  ? x 3  ? ERCP N/A 10/05/2019  ? Procedure: ENDOSCOPIC RETROGRADE CHOLANGIOPANCREATOGRAPHY (ERCP);  Surgeon: Lucilla Lame, MD;  Location: [facility] ENDOSCOPY;  Service: Endoscopy;  Laterality: N/A;  ? FACIAL COSMETIC SURGERY  2013  ? Dr. Nadeen Landau  ? FOOT SURGERY    ? right  ? ILIO-FEMORAL BYPASS GRAFT  2011  ? LIVER BIOPSY N/A 10/31/2019  ? Procedure: LIVER BIOPSY;  Surgeon: Olean Ree, MD;  Location: [facility] ORS;  Service: General;  Laterality: N/A;  ? MITRAL VALVE REPAIR  01/2010  ? ORIF ANKLE FRACTURE Right 10/08/2016  ? Procedure: OPEN REDUCTION  INTERNAL FIXATION (ORIF) ANKLE FRACTURE;  Surgeon: Corky Mull, MD;  Location: [facility] ORS;  Service: Orthopedics;  Laterality: Right;  ? ORIF HUMERUS FRACTURE Right 12/20/2019  ? Procedure: OPEN REDUCTION INTERNAL FIXATION (ORIF) DISTAL HUMERUS FRACTURE;  Surgeon: Shona Needles, MD;  Location: [place];  Service: Orthopedics;  Laterality: Right;  ? ORIF WRIST FRACTURE Right 12/29/2018  ? Procedure: OPEN REDUCTION INTERNAL FIXATION (ORIF) WRIST FRACTURE;  Surgeon: Corky Mull, MD;  Location: [facility] ORS;  Service: Orthopedics;  Laterality: Right;  ? PARATHYROIDECTOMY    ? TUBAL LIGATION    ? ? ?Current Outpatient Medications  ?Medication Sig Dispense Refill  ? aspirin 81 MG EC tablet Take 81 mg by mouth daily.      ? calcium carbonate (TUMS - DOSED IN MG ELEMENTAL CALCIUM) 500 MG chewable tablet Chew 1 tablet (200 mg of elemental calcium total) by mouth 2 (two) times daily with a meal.    ? Cholecalciferol (VITAMIN D3) 1000 UNITS CAPS Take 1,000 Units by mouth daily.    ? doxylamine, Sleep, (SLEEP AID) 25 MG tablet Take 25 mg by mouth at bedtime as needed.     ? ezetimibe (ZETIA) 10 MG tablet Take 1 tablet (10 mg total) by mouth daily. 90 tablet 2  ? folic acid (FOLVITE) 1 MG tablet Take 5 tablets (5 mg total) by mouth daily.    ? gabapentin (NEURONTIN) 300 MG capsule Take 1 capsule (300 mg total) by mouth 3 (three) times daily. 90 capsule 3  ? metoprolol tartrate (LOPRESSOR) 25 MG tablet Take 0.5 tablets (12.5 mg total) by mouth 2 (two) times daily. 90 tablet 3  ? Multiple Vitamins-Minerals (PRESERVISION AREDS 2 PO) Take 1 capsule by mouth 2 (two) times daily.    ? MYRBETRIQ 25 MG TB24 tablet Take 25 mg by mouth daily.    ? Oxycodone HCl 10 MG TABS Take by mouth.    ? pantoprazole (PROTONIX) 40 MG tablet Take 1 tablet (40 mg total) by mouth daily. 90 tablet 4  ? Probiotic Product (RISA-BID PROBIOTIC PO) Take 250 mg by mouth daily.     ? rosuvastatin (CRESTOR) 10 MG tablet TAKE 1 TABLET BY MOUTH AT BEDTIME 90 tablet 0  ?  sertraline (ZOLOFT) 50 MG tablet Take 50 mg by mouth daily.    ? vitamin B-12 (CYANOCOBALAMIN) 1000 MCG tablet Take 1,000 mcg by mouth daily.    ? cyclobenzaprine (FLEXERIL) 5 MG tablet Take 1 tablet (5 mg total) by mouth 3 (three) times daily as needed for muscle spasms. (Patient not taking: Reported on 07/14/2021) 20 tablet 0  ? furosemide (LASIX) 20 MG tablet Take 1 tablet (20 mg total) by mouth daily as needed. (Patient not taking: Reported on 07/14/2021) 30 tablet 1  ? ?No current facility-administered medications for this visit.  ? ? ?Allergies:   Patient has no known allergies.  ? ?Social History:  The patient  reports that she quit smoking about 12 years ago. Her smoking use included cigarettes. She has a  44.00 pack-year smoking history. She has never used smokeless tobacco. She reports current alcohol use of about 1.0 standard drink per week. She reports that she does not use drugs.  ? ?Family History:   family history includes Heart disease in her father.  ? ? ?Review of Systems: ?Review of Systems  ?Constitutional: Negative.   ?HENT: Negative.    ?Respiratory: Negative.    ?Cardiovascular: Negative.   ?Gastrointestinal: Negative.   ?Musculoskeletal:  Positive for back pain.  ?     Leg swelling  ?Neurological: Negative.   ?Psychiatric/Behavioral: Negative.    ?All other systems reviewed and are negative. ? ?PHYSICAL EXAM: ?VS:  BP 120/62 (BP Location: Left Arm, Patient Position: Sitting, Cuff Size: Normal)   Pulse 68   Ht 5' (1.524 m)   Wt 121 lb 6 oz (55.1 kg)   SpO2 98%   BMI 23.70 kg/m?  , BMI Body mass index is 23.7 kg/m?Marland Kitchen ?Constitutional:  oriented to person, place, and time. No distress.  ?HENT:  ?Head: Grossly normal ?Eyes:  no discharge. No scleral icterus.  ?Neck: No JVD, no carotid bruits  ?Cardiovascular: Regular rate and rhythm, no murmurs appreciated ?Pulmonary/Chest: Clear to auscultation bilaterally, no wheezes or rails ?Abdominal: Soft.  no distension.  no tenderness.  ?Musculoskeletal:  Normal range of motion ?Neurological:  normal muscle tone. Coordination normal. No atrophy ?Skin: Skin warm and dry ?Psychiatric: normal affect, pleasant ? ?Recent Labs: ?11/07/2020: ALT 37; TSH 2.260 ?11/09/2020: Mag

## 2021-07-14 ENCOUNTER — Other Ambulatory Visit: Payer: Self-pay

## 2021-07-14 ENCOUNTER — Ambulatory Visit (INDEPENDENT_AMBULATORY_CARE_PROVIDER_SITE_OTHER): Payer: Medicare HMO | Admitting: Cardiovascular Disease

## 2021-07-14 ENCOUNTER — Encounter: Payer: Self-pay | Admitting: Cardiovascular Disease

## 2021-07-14 VITALS — BP 120/62 | HR 68 | Ht 60.0 in | Wt 121.4 lb

## 2021-07-14 DIAGNOSIS — I10 Essential (primary) hypertension: Secondary | ICD-10-CM

## 2021-07-14 DIAGNOSIS — I25118 Atherosclerotic heart disease of native coronary artery with other forms of angina pectoris: Secondary | ICD-10-CM | POA: Diagnosis not present

## 2021-07-14 DIAGNOSIS — E782 Mixed hyperlipidemia: Secondary | ICD-10-CM

## 2021-07-14 DIAGNOSIS — E785 Hyperlipidemia, unspecified: Secondary | ICD-10-CM | POA: Diagnosis not present

## 2021-07-14 DIAGNOSIS — I2510 Atherosclerotic heart disease of native coronary artery without angina pectoris: Secondary | ICD-10-CM

## 2021-07-14 DIAGNOSIS — Z87891 Personal history of nicotine dependence: Secondary | ICD-10-CM

## 2021-07-14 DIAGNOSIS — I739 Peripheral vascular disease, unspecified: Secondary | ICD-10-CM

## 2021-07-14 NOTE — Patient Instructions (Addendum)
Medication Instructions:  ?No changes ? ?If you need a refill on your cardiac medications before your next appointment, please call your pharmacy.  ? ? ?Lab work: ?No new labs needed ?If you have had any lab work done at [facility], please have them fax the results of these to Dr. Rockey Situ at [phone] ? ? ?Testing/Procedures: ?No new testing needed ? ? ?Follow-Up: ?At [facility], you and your health needs are our priority.  As part of our continuing mission to provide you with exceptional heart care, we have created designated Provider Care Teams.  These Care Teams include your primary Cardiologist (physician) and Advanced Practice Providers (APPs -  Physician Assistants and Nurse Practitioners) who all work together to provide you with the care you need, when you need it. ? ?You will need a follow up appointment in 12 months ? ?Providers on your designated Care Team:   ?Murray Hodgkins, NP ?Christell Faith, PA-C ?Cadence Kathlen Mody, PA-C ? ?COVID-19 Vaccine Information can be found at: ShippingScam.co.uk For questions related to vaccine distribution or appointments, please email [email or web address] or call 3107589408.  ? ?

## 2021-07-14 NOTE — Progress Notes (Signed)
ABSTRACTED MAMMOGRAM ?

## 2021-07-15 ENCOUNTER — Telehealth: Payer: Self-pay | Admitting: Family

## 2021-07-15 ENCOUNTER — Encounter (INDEPENDENT_AMBULATORY_CARE_PROVIDER_SITE_OTHER): Payer: Medicare HMO | Admitting: Ophthalmology

## 2021-07-15 ENCOUNTER — Telehealth: Payer: Self-pay

## 2021-07-15 DIAGNOSIS — H43813 Vitreous degeneration, bilateral: Secondary | ICD-10-CM | POA: Diagnosis not present

## 2021-07-15 DIAGNOSIS — H353231 Exudative age-related macular degeneration, bilateral, with active choroidal neovascularization: Secondary | ICD-10-CM | POA: Diagnosis not present

## 2021-07-15 DIAGNOSIS — I10 Essential (primary) hypertension: Secondary | ICD-10-CM

## 2021-07-15 DIAGNOSIS — H35033 Hypertensive retinopathy, bilateral: Secondary | ICD-10-CM

## 2021-07-15 NOTE — Telephone Encounter (Signed)
Call patient ?Mammogram at [facility] was normal.  Repeat in 1 year ?

## 2021-07-15 NOTE — Telephone Encounter (Signed)
LMTCB TO GET RESULTS FROM MAMMOGRAM...THEY WERE NORMAL ?

## 2021-07-15 NOTE — Telephone Encounter (Signed)
LMTCB to office  for mammogram results..they were normal ?

## 2021-07-16 NOTE — Telephone Encounter (Signed)
Patient returned office phone call, note read. ?

## 2021-07-23 NOTE — Telephone Encounter (Signed)
Patient called office back for results. ?

## 2021-07-28 NOTE — Telephone Encounter (Signed)
LMTCB to get Mammogram results which were normal. ?

## 2021-08-05 ENCOUNTER — Telehealth: Payer: Self-pay | Admitting: Family

## 2021-08-05 NOTE — Telephone Encounter (Signed)
FYI has requested a delay in social work evaluation. The patient is moving this week. They will follow up with the patient next week. ?

## 2021-08-08 NOTE — Telephone Encounter (Signed)
noted 

## 2021-08-11 ENCOUNTER — Telehealth: Payer: Self-pay | Admitting: Family

## 2021-08-11 NOTE — Telephone Encounter (Signed)
Has been faxed to [facility].  ?

## 2021-08-11 NOTE — Telephone Encounter (Signed)
[name] from [facility], [phone]. Nursing orders were faxed on 08/05/2021, they have not received orders. Fax number [phone]. Please refax. ?

## 2021-08-19 ENCOUNTER — Telehealth: Payer: Self-pay | Admitting: Family

## 2021-08-19 ENCOUNTER — Telehealth: Payer: Self-pay

## 2021-08-19 NOTE — Telephone Encounter (Signed)
Call pt ?I am receiving  paperwork from home health ?I havent seen pt since virtual visit 11/2020 ? ?Please sch IN person visit so that we can discuss paper work and her needs ?

## 2021-08-19 NOTE — Telephone Encounter (Signed)
LMTCB to call back to office to schedule in person appointment!!! ?

## 2021-08-19 NOTE — Telephone Encounter (Signed)
LMTCB to call back office to make an in person appointment? ?

## 2021-08-20 NOTE — Telephone Encounter (Signed)
Pt does not transportation to come to appointment at this time. Pt need refill on Oxycodone HCl sent to [facility] ?

## 2021-08-20 NOTE — Telephone Encounter (Signed)
Spoke to patient about transportation and she stated to me that she would try and see when she could get someone to bring her and then we could make an appointment to come into the office ?

## 2021-08-20 NOTE — Telephone Encounter (Signed)
Pt needs transportation ?She was seen by cardiology in person 07/14/21 ?Who took her to this appointment? ? ?Please advise on resources so pt may get a ride to office ?For safety, I cannot evaluate pain over vv.  ? ?She has not been seen in person since 09/2020 ? ? ?

## 2021-08-20 NOTE — Telephone Encounter (Signed)
Pt called in stating that she doesn't have transportation... Pt is requesting to do a VV... Pt state that she is in pain... Pt stated that her pain management doctor left practice and didn't know that he did.. pt is requesting medication... Advised pt per Domingo Mend that a message was sent over to NP Arnett and waiting on response...   ?

## 2021-08-21 NOTE — Telephone Encounter (Signed)
Patient has appointment scheduled tomorrow with Dr. Olivia Mackie 08/22/21 ?

## 2021-08-22 ENCOUNTER — Ambulatory Visit (INDEPENDENT_AMBULATORY_CARE_PROVIDER_SITE_OTHER): Payer: Medicare HMO | Admitting: Internal Medicine

## 2021-08-22 ENCOUNTER — Encounter: Payer: Self-pay | Admitting: Internal Medicine

## 2021-08-22 VITALS — BP 130/62 | HR 56 | Temp 98.1°F | Resp 14 | Ht 60.0 in | Wt 116.4 lb

## 2021-08-22 DIAGNOSIS — G894 Chronic pain syndrome: Secondary | ICD-10-CM

## 2021-08-22 DIAGNOSIS — Z5982 Transportation insecurity: Secondary | ICD-10-CM | POA: Diagnosis not present

## 2021-08-22 DIAGNOSIS — M47812 Spondylosis without myelopathy or radiculopathy, cervical region: Secondary | ICD-10-CM | POA: Diagnosis not present

## 2021-08-22 DIAGNOSIS — G629 Polyneuropathy, unspecified: Secondary | ICD-10-CM | POA: Diagnosis not present

## 2021-08-22 DIAGNOSIS — M542 Cervicalgia: Secondary | ICD-10-CM

## 2021-08-22 DIAGNOSIS — Z748 Other problems related to care provider dependency: Secondary | ICD-10-CM | POA: Diagnosis not present

## 2021-08-22 DIAGNOSIS — G8929 Other chronic pain: Secondary | ICD-10-CM | POA: Insufficient documentation

## 2021-08-22 DIAGNOSIS — M5136 Other intervertebral disc degeneration, lumbar region: Secondary | ICD-10-CM

## 2021-08-22 DIAGNOSIS — M545 Low back pain, unspecified: Secondary | ICD-10-CM

## 2021-08-22 MED ORDER — OXYCODONE HCL 10 MG PO TABS: 10.0000 mg | ORAL_TABLET | Freq: Three times a day (TID) | ORAL | 0 refills | Status: DC

## 2021-08-22 NOTE — Progress Notes (Signed)
Chief Complaint  ?Patient presents with  ? Acute Visit  ?  Disc refill for pain med Oxx, ran out 08/18/21. Pt was at [facility] for PT was instructed to call Raliegh Ip whom she was establish with to receive further refills. Pt call office last Friday and stated provider who dealt with pain management is no longer there. Requesting new referral locally. Requesting Placcard.   ? ?F/u  ?1. Chronic neck and back pain in wheelchair today on oxycodone 10 tid prn was getting from [facility] in [place] Dr. Alfonso Ramus but he is not doing this any longer and been out of pain meds since Monday can called last Friday. She had been in [place] place x 7 months and got out 07/28/21 for falls, recurrent and balance issues  ?Appt today needs new pain clinic her niece scheduled appt in [place] [facility] Dr. Nelva Bush 10/11/21 but does not want surgery of injections and pain medications have been worker for her she has abnormal imaging with arthritis in neck and lower back  ?She will need transportation referral done today  ?Wants handicap placard  ? ? ?Review of Systems  ?Constitutional:  Negative for weight loss.  ?HENT:  Negative for hearing loss.   ?Eyes:  Negative for blurred vision.  ?Respiratory:  Negative for shortness of breath.   ?Cardiovascular:  Negative for chest pain.  ?Gastrointestinal:  Negative for abdominal pain and blood in stool.  ?Genitourinary:  Negative for dysuria.  ?Musculoskeletal:  Positive for back pain and falls. Negative for joint pain.  ?Skin:  Negative for rash.  ?Neurological:  Negative for headaches.  ?Psychiatric/Behavioral:  Negative for depression.   ?Past Medical History:  ?Diagnosis Date  ? Abnormal Pap smear of cervix   ? Dr. Prentice Docker   ? Coronary artery disease   ? a. 06/2009 MI/PCI to LCX and LAD; b. 01/2011 Occluded stents-->CABG x 3 ([facility]): LIMA->LAD, VG->LCX, VG->RPDA; c. 01/2012 Cath: LM 50, LAD 95ost, 135m LCX 95ost, 1016mRCA 3058mPDA small, LIMA->LAD ok, VG->LCX ok, VG->RPDA 100-->Med rx.  ?  Degenerative disc disease, lumbar   ? L4/5 noted CT ab/pelvis 05/19/11   ? D[place]deep [place] thrombosis) ([place] ? after [place] age [age] GERD (gastroesophageal reflux disease)   ? Hyperlipidemia   ? Hypertension   ? under control  ? Macular degeneration   ? [facility]  ? Parathyroid adenoma   ? right 13 x 8 mm s/p resection   ? PVD (peripheral vascular disease) ([place] ? a. 01/2010 s/p Aortofemoral bypass.  ? ?Past Surgical History:  ?Procedure Laterality Date  ? CARDIAC CATHETERIZATION  2013  ? [place] [facility] [place]    ? COLONOSCOPY    ? COLPOSCOPY    ? CORONARY ARTERY BYPASS GRAFT  02/07/2010  ? x 3  ? ERCP N/A 10/05/2019  ? Procedure: ENDOSCOPIC RETROGRADE CHOLANGIOPANCREATOGRAPHY (ERCP);  Surgeon: WohLucilla LameD;  [facility];  Service: Endoscopy;  Laterality: N/A;  ? FACIAL COSMETIC SURGERY  2013  ? Dr. MadNadeen Landau FOOT SURGERY    ? right  ? ILIO-FEMORAL BYPASS GRAFT  2011  ? LIVER BIOPSY N/A 10/31/2019  ? Procedure: LIVER BIOPSY;  Surgeon: PisOlean ReeD;  Location: [facility] ORS;  Service: General;  Laterality: N/A;  ? MITRAL VALVE REPAIR  01/2010  ? ORIF ANKLE FRACTURE Right 10/08/2016  ? Procedure: OPEN REDUCTION INTERNAL FIXATION (ORIF) ANKLE FRACTURE;  Surgeon: PogCorky MullD;  Location:  [facility] ORS;  Service: Orthopedics;  Laterality: Right;  ? ORIF HUMERUS FRACTURE Right 12/20/2019  ? Procedure: OPEN REDUCTION INTERNAL FIXATION (ORIF) DISTAL HUMERUS FRACTURE;  Surgeon: Shona Needles, MD;  Location: [place];  Service: Orthopedics;  Laterality: Right;  ? ORIF WRIST FRACTURE Right 12/29/2018  ? Procedure: OPEN REDUCTION INTERNAL FIXATION (ORIF) WRIST FRACTURE;  Surgeon: Corky Mull, MD;  Location: [facility] ORS;  Service: Orthopedics;  Laterality: Right;  ? PARATHYROIDECTOMY    ? TUBAL LIGATION    ? ?Family History  ?Problem Relation Age of Onset  ? Heart disease Father   ? ?Social History  ? ?Socioeconomic History  ? Marital status: Single  ?  Spouse name: Not on  file  ? Number of children: Not on file  ? Years of education: Not on file  ? Highest education level: Not on file  ?Occupational History  ? Not on file  ?Tobacco Use  ? Smoking status: Former  ?  Packs/day: 1.00  ?  Years: 44.00  ?  Pack years: 44.00  ?  Types: Cigarettes  ?  Quit date: 07/15/2009  ?  Years since quitting: 12.1  ? Smokeless tobacco: Never  ? Tobacco comments:  ?  quit 06/2009 smoked from 20s to 2011 1.5 ppd no FH lung cancer   ?Vaping Use  ? Vaping Use: Never used  ?Substance and Sexual Activity  ? Alcohol use: Yes  ?  Alcohol/week: 1.0 standard drink  ?  Types: 1 Standard drinks or equivalent per week  ?  Comment: occ.  ? Drug use: No  ? Sexual activity: Not Currently  ?  Birth control/protection: Post-menopausal  ?Other Topics Concern  ? Not on file  ?Social History Narrative  ? 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   ? Diet- following heart healthy diet, [employer].   ? Exercise- walks daily , wears fit bit.   ? ?Social Determinants of Health  ? ?Financial Resource Strain: Low Risk   ? Difficulty of Paying Living Expenses: Not hard at all  ?Food Insecurity: No Food Insecurity  ? Worried About Charity fundraiser in the Last Year: Never true  ? Ran Out of Food in the Last Year: Never true  ?Transportation Needs: No Transportation Needs  ? Lack of Transportation (Medical): No  ? Lack of Transportation (Non-Medical): No  ?Physical Activity: Sufficiently Active  ? Days of Exercise per Week: 5 days  ? Minutes of Exercise per Session: 30 min  ?Stress: No Stress Concern Present  ? Feeling of Stress : Not at all  ?Social Connections: Unknown  ? Frequency of Communication with Friends and Family: More than three times a week  ? Frequency of Social Gatherings with Friends and Family: More than three times a week  ? Attends Religious Services: Not on file  ? Active Member of Clubs or Organizations: Not on file  ? Attends Archivist Meetings: Not on file  ? Marital Status: Not on file   ?Intimate Partner Violence: Not At Risk  ? Migues of Current or Ex-Partner: No  ? Emotionally Abused: No  ? Physically Abused: No  ? Sexually Abused: No  ? ?Current Meds  ?Medication Sig  ? aspirin 81 MG EC tablet Take 81 mg by mouth daily.    ? calcium carbonate (TUMS - DOSED IN MG ELEMENTAL CALCIUM) 500 MG chewable tablet Chew 1 tablet (200 mg of elemental calcium total) by mouth 2 (two) times daily with a meal.  ?  Cholecalciferol (VITAMIN D3) 1000 UNITS CAPS Take 1,000 Units by mouth daily.  ? cyclobenzaprine (FLEXERIL) 5 MG tablet Take 1 tablet (5 mg total) by mouth 3 (three) times daily as needed for muscle spasms.  ? doxylamine, Sleep, (SLEEP AID) 25 MG tablet Take 25 mg by mouth at bedtime as needed.   ? ezetimibe (ZETIA) 10 MG tablet Take 1 tablet (10 mg total) by mouth daily.  ? folic acid (FOLVITE) 1 MG tablet Take 5 tablets (5 mg total) by mouth daily.  ? gabapentin (NEURONTIN) 300 MG capsule Take 1 capsule (300 mg total) by mouth 3 (three) times daily.  ? metoprolol tartrate (LOPRESSOR) 25 MG tablet Take 0.5 tablets (12.5 mg total) by mouth 2 (two) times daily.  ? Multiple Vitamins-Minerals (PRESERVISION AREDS 2 PO) Take 1 capsule by mouth 2 (two) times daily.  ? MYRBETRIQ 25 MG TB24 tablet Take 25 mg by mouth daily.  ? pantoprazole (PROTONIX) 40 MG tablet Take 1 tablet (40 mg total) by mouth daily.  ? Probiotic Product (RISA-BID PROBIOTIC PO) Take 250 mg by mouth daily.   ? rosuvastatin (CRESTOR) 10 MG tablet TAKE 1 TABLET BY MOUTH AT BEDTIME  ? sertraline (ZOLOFT) 50 MG tablet Take 50 mg by mouth daily.  ? vitamin B-12 (CYANOCOBALAMIN) 1000 MCG tablet Take 1,000 mcg by mouth daily.  ? [DISCONTINUED] Oxycodone HCl 10 MG TABS Take by mouth.  ? ?No Known Allergies ?Recent Results (from the past 2160 hour(s))  ?HM MAMMOGRAPHY     Status: None  ? Collection Time: 07/08/21  1:45 AM  ?Result Value Ref Range  ? HM Mammogram 0-4 Bi-Rad 0-4 Bi-Rad, Self Reported Normal  ? ?Objective  ?Body mass index is 22.73  kg/m?. ?Wt Readings from Last 3 Encounters:  ?08/22/21 116 lb 6.4 oz (52.8 kg)  ?07/14/21 121 lb 6 oz (55.1 kg)  ?05/07/21 114 lb (51.7 kg)  ? ?Temp Readings from Last 3 Encounters:  ?08/22/21 98.1 ?F (36.7 ?C

## 2021-08-22 NOTE — Telephone Encounter (Signed)
FYI ?[place] ? ?Im not sure why patient put on your schedule. Not what I had intended.  ? ?She is over due follow up due to chronic back pain. I have not seen her in some time and received paperwork for home health which I didn't fill out as I have not seen her in person since 09/2020.  ? ?If you can see if their is an acute need and then have her sch f/u with me, that would be great.  ? ? Jenate, she will need 30 min with me.  ? ?

## 2021-08-22 NOTE — Patient Instructions (Addendum)
Dr. Nathanial Rancher ?5.0 ?2 Google reviews ?Pain management physician in [place], [place] ?Address: [address], [place], [place] [id number] ?Phone: [phone] ? ? ?Someone will call you for transportation  ? ?

## 2021-08-25 NOTE — Telephone Encounter (Addendum)
Patient saw Dr Olivia Mackie on 08/22/21  and she has appointment with you on 09/12/21 ?

## 2021-08-26 ENCOUNTER — Encounter (INDEPENDENT_AMBULATORY_CARE_PROVIDER_SITE_OTHER): Payer: Medicare HMO | Admitting: Ophthalmology

## 2021-08-26 ENCOUNTER — Telehealth: Payer: Self-pay | Admitting: *Deleted

## 2021-08-26 DIAGNOSIS — H43813 Vitreous degeneration, bilateral: Secondary | ICD-10-CM | POA: Diagnosis not present

## 2021-08-26 DIAGNOSIS — H35033 Hypertensive retinopathy, bilateral: Secondary | ICD-10-CM

## 2021-08-26 DIAGNOSIS — I10 Essential (primary) hypertension: Secondary | ICD-10-CM

## 2021-08-26 DIAGNOSIS — H353231 Exudative age-related macular degeneration, bilateral, with active choroidal neovascularization: Secondary | ICD-10-CM | POA: Diagnosis not present

## 2021-08-26 NOTE — Telephone Encounter (Signed)
? ?  Telephone encounter was:  Successful.  ?08/26/2021 ?Name: [name] MRN: [id number] DOB: [birth year] ? ?[name] is a 76 y.o. year old female who is a primary care patient of Burnard Hawthorne, FNP . The community resource team was consulted for assistance with Transportation Needs  ? ?Care guide performed the following interventions: Patient provided with information about care guide support team and interviewed to confirm resource needs ?Discussed resources to assist with transportation  .Patient is possible to use [facility] will verify no insurance transportaion benefit , and also follow up on [facility] , patient said office offered [facility]" transportation " Will follow up with practice after verifying other options ? ?Follow Up Plan:  Care guide will follow up with patient by phone over the next days ? ?Yulanda Diggs Greenauer -Selinda Eon ?Care Guide , Embedded Care Coordination ?[place], Care Management  ?[phone] ?[address] , [place] [address] ?Email : Ashby [email or web address]. [email or web address]-moran [email or web address] ?  ?

## 2021-08-27 ENCOUNTER — Telehealth: Payer: Self-pay | Admitting: *Deleted

## 2021-08-27 NOTE — Telephone Encounter (Signed)
? ?  Telephone encounter was:  Successful.  ?08/27/2021 ?Name: RHONA FUSILIER MRN: [id number] DOB: May 13, 1945 ? ?Janai Maudlin Houchen is a 76 y.o. year old female who is a primary care patient of Burnard Hawthorne, FNP . The community resource team was consulted for assistance with Transportation Needs  ? ?Care guide performed the following interventions: Placed referral to [facility] via phone for appt 09/12/2021 . ? ?Follow Up Plan:  Care guide will follow up with patient by phone over the next days ? ?Robin Petrakis Greenauer -Selinda Eon ?Care Guide , Embedded Care Coordination ?[place], Care Management  ?[phone] ?[address] , [place] [address] ?Email : Ashby [email or web address]. [email or web address] [email or web address] ?  ?

## 2021-08-27 NOTE — Telephone Encounter (Signed)
? ?  Telephone encounter was:  Successful.  ?08/27/2021 ?Name: [name] MRN: [id number] DOB: [birth year] ? ?[name] is a 76 y.o. year old female who is a primary care patient of Burnard Hawthorne, FNP . The community resource team was consulted for assistance with Transportation Needs  ? ?Care guide performed the following interventions: Returning patient call then called [facility] to change her ride to a wheelchair ride they have all contact informaton and also she was provided with that inofrmation as well . ? ?Follow Up Plan:  No further follow up planned at this time. The patient has been provided with needed resources. ?Lovett Sox -Selinda Eon ?Care Guide , Embedded Care Coordination ?[place], Care Management  ?[phone] ?[address] , [place] [address] ?Email : Ashby Dawes. Greenauer-moran [email or web address] ?  ?

## 2021-09-12 ENCOUNTER — Encounter: Payer: Self-pay | Admitting: Family

## 2021-09-12 ENCOUNTER — Other Ambulatory Visit: Payer: Self-pay | Admitting: Internal Medicine

## 2021-09-12 ENCOUNTER — Other Ambulatory Visit: Payer: Self-pay | Admitting: Family

## 2021-09-12 ENCOUNTER — Ambulatory Visit (INDEPENDENT_AMBULATORY_CARE_PROVIDER_SITE_OTHER): Payer: Medicare HMO | Admitting: Family

## 2021-09-12 VITALS — BP 124/66 | HR 82 | Temp 97.7°F | Ht 60.0 in | Wt 119.0 lb

## 2021-09-12 DIAGNOSIS — M542 Cervicalgia: Secondary | ICD-10-CM | POA: Diagnosis not present

## 2021-09-12 DIAGNOSIS — M545 Low back pain, unspecified: Secondary | ICD-10-CM

## 2021-09-12 DIAGNOSIS — G8929 Other chronic pain: Secondary | ICD-10-CM

## 2021-09-12 DIAGNOSIS — M47812 Spondylosis without myelopathy or radiculopathy, cervical region: Secondary | ICD-10-CM

## 2021-09-12 DIAGNOSIS — G629 Polyneuropathy, unspecified: Secondary | ICD-10-CM

## 2021-09-12 DIAGNOSIS — G894 Chronic pain syndrome: Secondary | ICD-10-CM

## 2021-09-12 DIAGNOSIS — I10 Essential (primary) hypertension: Secondary | ICD-10-CM | POA: Diagnosis not present

## 2021-09-12 DIAGNOSIS — M5136 Other intervertebral disc degeneration, lumbar region: Secondary | ICD-10-CM

## 2021-09-12 DIAGNOSIS — F419 Anxiety disorder, unspecified: Secondary | ICD-10-CM

## 2021-09-12 DIAGNOSIS — I25118 Atherosclerotic heart disease of native coronary artery with other forms of angina pectoris: Secondary | ICD-10-CM

## 2021-09-12 DIAGNOSIS — D649 Anemia, unspecified: Secondary | ICD-10-CM

## 2021-09-12 NOTE — Assessment & Plan Note (Signed)
Chronic distal neuropathy.  Continue gabapentin 300 mg 3 times daily

## 2021-09-12 NOTE — Progress Notes (Signed)
Subjective:    Patient ID: [name], female    DOB: 09-25-1945, 76 y.o.   MRN: [id number]  CC: [name] is a 76 y.o. female who presents today for follow up.   HPI:  Here today for follow up and to discuss [place] well.  No new complaints.  She is happy to be independent in home  Nurse is coming to home twice per week PT and OT is coming weekly  No falls.  Using rollator at home.      No h/o opioid abuse. No ho overdose.  No SI/hi.  Chronic peripheral neuropathy Compliant with gabapentin '300mg'$   TID She denies breakthrough depression or anxiety.  She is compliant with Zoloft 50 mg Seen 08/22/2021 for chronic neck and back pain.  She uses a wheelchair.  She is compliant with oxycodone 10 mg 3 times daily as needed and has been getting from Dr. Alfonso Ramus who longer prescribes.  Referral placed to Dr. Humphrey Rolls. She has appointment with Dr Nelva Bush , Rosanne Gutting 10/11/21.   She lives in alone in apartment. She is cooking, bathing.   She doesn't drive with neuropathy.   Care guide through [facility] contacted and discharged patient on 08/27/21   11/08/20 MRI cervical spine concerning for demyelinating disease, cervical spondylosis, severe spinal canal stenosis C2-C3, C3-C4, C4-C5 and C5-6 11/08/20 MRI thoracic spine disc bulge at T6-T7.   mild relative spinal canal narrowing Fusion seen T5 and T6 and T6 and T7 MRI lumbar 11/07/20 central canal is patent with mild multilevel degenerative disc disease.  Mild right narrowing L4-L5.  Mild, remote compression fracture of L1 HISTORY:  Past Medical History:  Diagnosis Date   Abnormal Pap smear of cervix    Dr. Prentice Docker    Coronary artery disease    a. 06/2009 MI/PCI to LCX and LAD; b. 01/2011 Occluded stents-->CABG x 3 ([facility]): LIMA->LAD, VG->LCX, VG->RPDA; c. 01/2012 Cath: LM 50, LAD 95ost, 123m LCX 95ost, 1071mRCA 3076mPDA small, LIMA->LAD ok, VG->LCX ok, VG->RPDA 100-->Med rx.   Degenerative disc disease, lumbar    L4/5 noted  CT ab/pelvis 05/19/11    DVT [place]deep venous thrombosis) ([place]  after [place] age [age]GERD (gastroesophageal reflux disease)    Hyperlipidemia    Hypertension    under control   Macular degeneration    [facility]   Parathyroid adenoma    right 13 x 8 mm s/p resection    PVD [place]peripheral vascular disease) ([place]  a. 01/2010 s/p Aortofemoral bypass.   Past Surgical History:  Procedure Laterality Date   [facility]  [facility]   [facility]     [facility]     COLPOSCOPY     CORONARY ARTERY BYPASS GRAFT  02/07/2010   x 3   ERCP N/A 10/05/2019   Procedure: ENDOSCOPIC RETROGRADE CHOLANGIOPANCREATOGRAPHY (ERCP);  Surgeon: WohLucilla LameD;  [facility];  Service: Endoscopy;  Laterality: N/A;   FACIAL COSMETIC SURGERY  2013   Dr. MadNadeen LandauFOOT SURGERY     right   ILIO-FEMORAL BYPASS GRAFT  2011   LIVER BIOPSY N/A 10/31/2019   Procedure: LIVER BIOPSY;  Surgeon: PisOlean ReeD;  Location: [facility] ORS;  Service: General;  Laterality: N/A;   MITRAL VALVE REPAIR  01/2010   ORIF ANKLE FRACTURE Right 10/08/2016   Procedure: OPEN REDUCTION INTERNAL FIXATION (ORIF) ANKLE FRACTURE;  Surgeon: PogCorky MullD;  [facility];  Service: Orthopedics;  Laterality: Right;   ORIF HUMERUS FRACTURE Right 12/20/2019   Procedure: OPEN REDUCTION INTERNAL FIXATION (ORIF) DISTAL HUMERUS FRACTURE;  Surgeon: Shona Needles, MD;  Location: [place];  Service: Orthopedics;  Laterality: Right;   ORIF WRIST FRACTURE Right 12/29/2018   Procedure: OPEN REDUCTION INTERNAL FIXATION (ORIF) WRIST FRACTURE;  Surgeon: Corky Mull, MD;  Location: [facility] ORS;  Service: Orthopedics;  Laterality: Right;   PARATHYROIDECTOMY     TUBAL LIGATION     Family History  Problem Relation Age of Onset   Heart disease Father     Allergies: Patient has no known allergies. Current Outpatient Medications on File Prior to Visit  Medication Sig Dispense Refill    aspirin 81 MG EC tablet Take 81 mg by mouth daily.       calcium carbonate (TUMS - DOSED IN MG ELEMENTAL CALCIUM) 500 MG chewable tablet Chew 1 tablet (200 mg of elemental calcium total) by mouth 2 (two) times daily with a meal.     Cholecalciferol (VITAMIN D3) 1000 UNITS CAPS Take 1,000 Units by mouth daily.     cyclobenzaprine (FLEXERIL) 5 MG tablet Take 1 tablet (5 mg total) by mouth 3 (three) times daily as needed for muscle spasms. 20 tablet 0   doxylamine, Sleep, (SLEEP AID) 25 MG tablet Take 25 mg by mouth at bedtime as needed.      ezetimibe (ZETIA) 10 MG tablet Take 1 tablet (10 mg total) by mouth daily. 90 tablet 2   folic acid (FOLVITE) 1 MG tablet Take 5 tablets (5 mg total) by mouth daily.     gabapentin (NEURONTIN) 300 MG capsule Take 1 capsule (300 mg total) by mouth 3 (three) times daily. 90 capsule 3   metoprolol tartrate (LOPRESSOR) 25 MG tablet Take 0.5 tablets (12.5 mg total) by mouth 2 (two) times daily. 90 tablet 3   Multiple Vitamins-Minerals (PRESERVISION AREDS 2 PO) Take 1 capsule by mouth 2 (two) times daily.     MYRBETRIQ 25 MG TB24 tablet Take 25 mg by mouth daily.     Oxycodone HCl 10 MG TABS Take 1 tablet (10 mg total) by mouth in the morning, at noon, and at bedtime. Please deliver 90 tablet 0   pantoprazole (PROTONIX) 40 MG tablet Take 1 tablet (40 mg total) by mouth daily. 90 tablet 4   Probiotic Product (RISA-BID PROBIOTIC PO) Take 250 mg by mouth daily.      rosuvastatin (CRESTOR) 10 MG tablet TAKE 1 TABLET BY MOUTH AT BEDTIME 90 tablet 0   sertraline (ZOLOFT) 50 MG tablet Take 50 mg by mouth daily.     vitamin B-12 (CYANOCOBALAMIN) 1000 MCG tablet Take 1,000 mcg by mouth daily.     No current facility-administered medications on file prior to visit.    Social History   Tobacco Use   Smoking status: Former    Packs/day: 1.00    Years: 44.00    Pack years: 44.00    Types: Cigarettes    Quit date: 07/15/2009    Years since quitting: 12.1   Smokeless  tobacco: Never   Tobacco comments:    quit 06/2009 smoked from 77s to 2011 1.5 ppd no FH lung cancer   Vaping Use   Vaping Use: Never used  Substance Use Topics   Alcohol use: Yes    Alcohol/week: 1.0 standard drink    Types: 1 Standard drinks or equivalent per week    Comment: occ.   Drug use: No  Review of Systems  Constitutional:  Negative for chills and fever.  Respiratory:  Negative for cough.   Cardiovascular:  Negative for chest pain and palpitations.  Gastrointestinal:  Negative for nausea and vomiting.  Musculoskeletal:  Positive for back pain.  Neurological:  Positive for numbness.     Objective:    BP 124/66   Pulse 82   Temp 97.7 F (36.5 C) (Oral)   Ht 5' (1.524 m)   Wt 119 lb (54 kg)   SpO2 96%   BMI 23.24 kg/m  BP Readings from Last 3 Encounters:  09/12/21 124/66  08/22/21 130/62  07/14/21 120/62   Wt Readings from Last 3 Encounters:  09/12/21 119 lb (54 kg)  08/22/21 116 lb 6.4 oz (52.8 kg)  07/14/21 121 lb 6 oz (55.1 kg)    Physical Exam Vitals reviewed.  Constitutional:      Appearance: She is well-developed.  Eyes:     Conjunctiva/sclera: Conjunctivae normal.  Cardiovascular:     Rate and Rhythm: Normal rate and regular rhythm.     Pulses: Normal pulses.     Heart sounds: Normal heart sounds.  Pulmonary:     Effort: Pulmonary effort is normal.     Breath sounds: Normal breath sounds. No wheezing, rhonchi or rales.  Skin:    General: Skin is warm and dry.  Neurological:     Mental Status: She is alert.     Comments: Sitting in wheelchair Bilateral lower extremities 5/5 strength  Psychiatric:        Speech: Speech normal.        Behavior: Behavior normal.        Thought Content: Thought content normal.       Assessment & Plan:   Problem List Items Addressed This Visit       Cardiovascular and Mediastinum   Coronary artery disease   Relevant Orders   Lipid panel   Essential hypertension, benign   Relevant Orders    Comprehensive metabolic panel     Nervous and Auditory   Neuropathy    Chronic distal neuropathy.  Continue gabapentin 300 mg 3 times daily         Musculoskeletal and Integument   DDD (degenerative disc disease), lumbar   Neck arthritis     Other   Anemia   Relevant Orders   CBC with Differential/Platelet   Anxiety    Chronic, stable.  Continue Zoloft 50 mg       Chronic back pain   Chronic neck pain - Primary    Chronic, stable.  She is upcoming appointment with appointment with Dr Nelva Bush , Rosanne Gutting 10/11/21.  She would need a prescription oxycodone until she can be seen by orthopedic.  Counseled her on the risk of opioid dependence, addiction and overdose.  Discussed side effects including excessive sedation.  I explained that I would not continue to prescribe oxycodone and this would have to be prescribed by pain management long-term.  She understands this.  No history of opioid abuse or overdose.  She will call me when due for oxycodone 10 mg 3 times daily. Pending urine drug screen       Relevant Orders   Drug Screen 12+Alcohol+CRT, Ur   Chronic pain syndrome     I have discontinued Lynnlee Revels. Dill's furosemide. I am also having her maintain her aspirin EC, Vitamin D3, Probiotic Product (RISA-BID PROBIOTIC PO), Multiple Vitamins-Minerals (PRESERVISION AREDS 2 PO), Sleep Aid, cyclobenzaprine, pantoprazole, vitamin B-12, metoprolol tartrate, ezetimibe, calcium  carbonate, folic acid, gabapentin, rosuvastatin, sertraline, Myrbetriq, and Oxycodone HCl.   No orders of the defined types were placed in this encounter.   Return precautions given.   Risks, benefits, and alternatives of the medications and treatment plan prescribed today were discussed, and patient expressed understanding.   Education regarding symptom management and diagnosis given to patient on AVS.  Continue to follow with Burnard Hawthorne, FNP for routine health maintenance.   Beverly Gust Velie and I agreed  with plan.   [name], FNP

## 2021-09-12 NOTE — Assessment & Plan Note (Signed)
Chronic, stable.  Continue Zoloft 50mg.  

## 2021-09-12 NOTE — Assessment & Plan Note (Addendum)
Chronic, stable.  She is upcoming appointment with appointment with Dr Nelva Bush , Rosanne Gutting 10/11/21.  She would need a prescription oxycodone until she can be seen by orthopedic.  Counseled her on the risk of opioid dependence, addiction and overdose.  Discussed side effects including excessive sedation.  I explained that I would not continue to prescribe oxycodone and this would have to be prescribed by pain management long-term.  She understands this.  No history of opioid abuse or overdose.  She will call me when due for oxycodone 10 mg 3 times daily. Pending urine drug screen

## 2021-09-12 NOTE — Addendum Note (Signed)
Addended by: Earlyne Iba on: 09/12/2021 03:13 PM   Modules accepted: Orders

## 2021-09-13 LAB — CBC WITH DIFFERENTIAL/PLATELET
Basophils Absolute: 0.1 10*3/uL (ref 0.0–0.2)
Basos: 1 %
EOS (ABSOLUTE): 0.1 10*3/uL (ref 0.0–0.4)
Eos: 2 %
Hematocrit: 41.9 % (ref 34.0–46.6)
Hemoglobin: 14 g/dL (ref 11.1–15.9)
Immature Grans (Abs): 0 10*3/uL (ref 0.0–0.1)
Immature Granulocytes: 0 %
Lymphocytes Absolute: 1.9 10*3/uL (ref 0.7–3.1)
Lymphs: 31 %
MCH: 30.8 pg (ref 26.6–33.0)
MCHC: 33.4 g/dL (ref 31.5–35.7)
MCV: 92 fL (ref 79–97)
Monocytes Absolute: 0.7 10*3/uL (ref 0.1–0.9)
Monocytes: 12 %
Neutrophils Absolute: 3.2 10*3/uL (ref 1.4–7.0)
Neutrophils: 54 %
Platelets: 196 10*3/uL (ref 150–450)
RBC: 4.55 x10E6/uL (ref 3.77–5.28)
RDW: 13.9 % (ref 11.7–15.4)
WBC: 6.1 10*3/uL (ref 3.4–10.8)

## 2021-09-13 LAB — LIPID PANEL
Chol/HDL Ratio: 1.8 ratio (ref 0.0–4.4)
Cholesterol, Total: 154 mg/dL (ref 100–199)
HDL: 88 mg/dL (ref >39)
LDL Chol Calc (NIH): 52 mg/dL (ref 0–99)
Triglycerides: 73 mg/dL (ref 0–149)
VLDL Cholesterol Cal: 14 mg/dL (ref 5–40)

## 2021-09-13 LAB — COMPREHENSIVE METABOLIC PANEL
ALT: 20 IU/L (ref 0–32)
AST: 27 IU/L (ref 0–40)
Albumin/Globulin Ratio: 2.4 — ABNORMAL HIGH (ref 1.2–2.2)
Albumin: 4.4 g/dL (ref 3.7–4.7)
Alkaline Phosphatase: 86 IU/L (ref 44–121)
BUN/Creatinine Ratio: 11 — ABNORMAL LOW (ref 12–28)
BUN: 12 mg/dL (ref 8–27)
Bilirubin Total: 0.3 mg/dL (ref 0.0–1.2)
CO2: 24 mmol/L (ref 20–29)
Calcium: 9.8 mg/dL (ref 8.7–10.3)
Chloride: 101 mmol/L (ref 96–106)
Creatinine, Ser: 1.14 mg/dL — ABNORMAL HIGH (ref 0.57–1.00)
Globulin, Total: 1.8 g/dL (ref 1.5–4.5)
Glucose: 108 mg/dL — ABNORMAL HIGH (ref 70–99)
Potassium: 4.3 mmol/L (ref 3.5–5.2)
Sodium: 141 mmol/L (ref 134–144)
Total Protein: 6.2 g/dL (ref 6.0–8.5)
eGFR: 51 mL/min/{1.73_m2} — ABNORMAL LOW (ref >59)

## 2021-09-16 NOTE — Telephone Encounter (Signed)
Please advise to refill this was filled by historical provider 07/10/21.

## 2021-09-17 ENCOUNTER — Telehealth: Payer: Self-pay | Admitting: Family

## 2021-09-17 NOTE — Telephone Encounter (Signed)
Part of it has resulted, the rest is still in preliminary.

## 2021-09-17 NOTE — Telephone Encounter (Addendum)
Per [facility], that test takes up to 6 days, not including the day it was collected or Monday (Holiday). Results should be available by Friday.

## 2021-09-17 NOTE — Telephone Encounter (Signed)
[name], [name]  What is status of urine drug screen?  Need asap as pt requesying opioid refill

## 2021-09-17 NOTE — Telephone Encounter (Signed)
I saw the partial but still it has been 6 days.[name]

## 2021-09-18 LAB — DRUG SCREEN 12+ALCOHOL+CRT, UR
Amphetamines, Urine: NEGATIVE ng/mL
BENZODIAZ UR QL: NEGATIVE ng/mL
Barbiturate: NEGATIVE ng/mL
Cannabinoids: NEGATIVE ng/mL
Cocaine (Metabolite): NEGATIVE ng/mL
Creatinine, Urine: 54.3 mg/dL (ref 20.0–300.0)
Ethanol, Urine: NEGATIVE %
Meperidine: NEGATIVE ng/mL
Methadone: NEGATIVE ng/mL
OPIATE SCREEN URINE: NEGATIVE ng/mL
Oxycodone/Oxymorphone, Urine: POSITIVE — AB
Phencyclidine: NEGATIVE ng/mL
Propoxyphene: NEGATIVE ng/mL
Tramadol: NEGATIVE ng/mL

## 2021-09-19 ENCOUNTER — Other Ambulatory Visit: Payer: Self-pay

## 2021-09-19 ENCOUNTER — Telehealth: Payer: Self-pay

## 2021-09-19 ENCOUNTER — Telehealth: Payer: Self-pay | Admitting: Family

## 2021-09-19 ENCOUNTER — Other Ambulatory Visit: Payer: Self-pay | Admitting: Family

## 2021-09-19 DIAGNOSIS — M5136 Other intervertebral disc degeneration, lumbar region: Secondary | ICD-10-CM

## 2021-09-19 DIAGNOSIS — G8929 Other chronic pain: Secondary | ICD-10-CM

## 2021-09-19 DIAGNOSIS — N3281 Overactive bladder: Secondary | ICD-10-CM

## 2021-09-19 MED ORDER — NALOXONE HCL 4 MG/0.1ML NA LIQD: NASAL | 2 refills | Status: DC

## 2021-09-19 MED ORDER — MYRBETRIQ 25 MG PO TB24: 25.0000 mg | ORAL_TABLET | Freq: Every day | ORAL | 1 refills | Status: DC

## 2021-09-19 MED ORDER — OXYCODONE HCL 10 MG PO TABS: 10.0000 mg | ORAL_TABLET | Freq: Three times a day (TID) | ORAL | 0 refills | Status: DC

## 2021-09-19 NOTE — Telephone Encounter (Signed)
Call pt  See result note regarding labs as well.   1) I refilled oxycodone ; she can pick up 09/22/21 Refilled for 30 days. She will need pain management to assume management. I have sent in narcan which is an opioid reversal medication used in setting of overdose. For safety, you must have this medication at home and with you at all times in case of emergency while you are on an opioid. Please mail below info about narcan to pt or send via mychart  2) I refilled mybetriq however I have not prescribed this to her in the past; please get more info on why she is taking.  Is she taking for urinary urgency? We typically use for overactive bladder. I have ordered urine study as baseline when she comes in to have renal function rechecked       Naloxone Nasal Spray What is this medication? NALOXONE (nal OX one) treats opioid overdose, which causes slow or shallow breathing, severe drowsiness, or trouble staying awake. Call emergency services after using this medication. You may need additional treatment. Naloxone works by reversing the effects of opioids. It belongs to a group of medications called opioid blockers. This medicine may be used for other purposes; ask your health care provider or pharmacist if you have questions. COMMON BRAND NAME(S): Kloxxado, Narcan What should I tell my care team before I take this medication? They need to know if you have any of these conditions: Heart disease Substance use disorder An unusual or allergic reaction to naloxone, other medications, foods, dyes, or preservatives Pregnant or trying to get pregnant Breast-feeding How should I use this medication? This medication is for use in the nose. Lay the person on their back. Support their neck with your hand and allow the head to tilt back before giving the medication. The nasal spray should be given into 1 nostril. After giving the medication, move the person onto their side. Do not remove or test the nasal  spray until ready to use. Get emergency medical help right away after giving the first dose of this medication, even if the person wakes up. You should be familiar with how to recognize the signs and symptoms of a narcotic overdose. If more doses are needed, give the additional dose in the other nostril. Talk to your care team about the use of this medication in children. While this medication may be prescribed for children as young as newborns for selected conditions, precautions do apply. Overdosage: If you think you have taken too much of this medicine contact a poison control center or emergency room at once. NOTE: This medicine is only for you. Do not share this medicine with others. What if I miss a dose? This does not apply. What may interact with this medication? This is only used during an emergency. No interactions are expected during emergency use. This list may not describe all possible interactions. Give your health care provider a list of all the medicines, herbs, non-prescription drugs, or dietary supplements you use. Also tell them if you smoke, drink alcohol, or use illegal drugs. Some items may interact with your medicine. What should I watch for while using this medication? Keep this medication ready for use in the case of an opioid overdose. Make sure that you have the phone number of your care team and local hospital ready. You may need to have additional doses of this medication. Each nasal spray contains a single dose. Some emergencies may require additional doses. After use, bring  the treated person to the nearest hospital or call 911. Make sure the treating care team knows that the person has received a dose of this medication. You will receive additional instructions on what to do during and after use of this medication before an emergency occurs. What side effects may I notice from receiving this medication? Side effects that you should report to your care team as soon as  possible: Allergic reactions--skin rash, itching, hives, swelling of the face, lips, tongue, or throat Side effects that usually do not require medical attention (report these to your care team if they continue or are bothersome): Constipation Dryness or irritation inside the nose Headache Increase in blood pressure Muscle spasms Stuffy nose Toothache This list may not describe all possible side effects. Call your doctor for medical advice about side effects. You may report side effects to FDA at 1-800-FDA-1088. Where should I keep my medication? Keep out of the reach of children and pets. Store between 20 and 25 degrees C (68 and 77 degrees F). Do not freeze. Throw away any unused medication after the expiration date. Keep in original box until ready to use. NOTE: This sheet is a summary. It may not cover all possible information. If you have questions about this medicine, talk to your doctor, pharmacist, or health care provider.  2023 Elsevier/Gold Standard (2021-03-03 00:00:00)

## 2021-09-19 NOTE — Telephone Encounter (Signed)
LMTCB to go over results and schedule labwork in 6 weeks

## 2021-09-19 NOTE — Progress Notes (Signed)
error 

## 2021-09-19 NOTE — Telephone Encounter (Signed)
[facility] in [place] and spoke Pharmacy tech and gave her verbal order to refill the Oxycodone on 09/20/21 instead of waiting until 09/22/21. Per Joycelyn Schmid

## 2021-09-19 NOTE — Telephone Encounter (Signed)
LMTCB to let patient know that  her Prescription for Oxycodone would be ready on 09/20/21 instead of 09/22/21

## 2021-09-21 ENCOUNTER — Other Ambulatory Visit: Payer: Self-pay | Admitting: Family

## 2021-09-21 DIAGNOSIS — N3281 Overactive bladder: Secondary | ICD-10-CM

## 2021-09-21 NOTE — Telephone Encounter (Signed)
Thanks jenate  I placed ref to urology Please let her know

## 2021-09-22 NOTE — Telephone Encounter (Signed)
See prior phone note Referral to urology in place

## 2021-10-01 ENCOUNTER — Telehealth: Payer: Self-pay | Admitting: Family

## 2021-10-01 NOTE — Telephone Encounter (Signed)
Pt called requesting a referral for pain management for her back

## 2021-10-02 ENCOUNTER — Telehealth: Payer: Self-pay

## 2021-10-02 NOTE — Telephone Encounter (Signed)
Care everywhere 

## 2021-10-03 NOTE — Telephone Encounter (Signed)
Spoke to patient and informed her about referral sent for pain management explained to her that  We were waiting to hear back from Dr. Marella Bile office. Representative stated that someone should be calling us back on Tuesday?

## 2021-10-03 NOTE — Telephone Encounter (Signed)
Patient called about the status for a referral to Pain Management, Dr Chancy Milroy.

## 2021-10-07 ENCOUNTER — Other Ambulatory Visit: Payer: Self-pay

## 2021-10-07 DIAGNOSIS — K219 Gastro-esophageal reflux disease without esophagitis: Secondary | ICD-10-CM

## 2021-10-07 MED ORDER — FOLIC ACID 1 MG PO TABS: 5.0000 mg | ORAL_TABLET | Freq: Every day | ORAL | Status: DC

## 2021-10-07 MED ORDER — PANTOPRAZOLE SODIUM 40 MG PO TBEC: 40.0000 mg | DELAYED_RELEASE_TABLET | Freq: Every day | ORAL | 4 refills | Status: DC

## 2021-10-07 MED ORDER — EZETIMIBE 10 MG PO TABS: 10.0000 mg | ORAL_TABLET | Freq: Every day | ORAL | 2 refills | Status: DC

## 2021-10-07 MED ORDER — SERTRALINE HCL 50 MG PO TABS: 50.0000 mg | ORAL_TABLET | Freq: Every day | ORAL | 3 refills | Status: DC

## 2021-10-07 MED ORDER — ROSUVASTATIN CALCIUM 10 MG PO TABS: 10.0000 mg | ORAL_TABLET | Freq: Every day | ORAL | 3 refills | Status: DC

## 2021-10-10 ENCOUNTER — Telehealth: Payer: Self-pay | Admitting: Family

## 2021-10-13 ENCOUNTER — Other Ambulatory Visit (INDEPENDENT_AMBULATORY_CARE_PROVIDER_SITE_OTHER): Payer: Medicare HMO

## 2021-10-13 DIAGNOSIS — N3281 Overactive bladder: Secondary | ICD-10-CM

## 2021-10-14 ENCOUNTER — Encounter (INDEPENDENT_AMBULATORY_CARE_PROVIDER_SITE_OTHER): Payer: Medicare HMO | Admitting: Ophthalmology

## 2021-10-14 DIAGNOSIS — I10 Essential (primary) hypertension: Secondary | ICD-10-CM

## 2021-10-14 DIAGNOSIS — H35033 Hypertensive retinopathy, bilateral: Secondary | ICD-10-CM | POA: Diagnosis not present

## 2021-10-14 DIAGNOSIS — H43813 Vitreous degeneration, bilateral: Secondary | ICD-10-CM

## 2021-10-14 DIAGNOSIS — H353231 Exudative age-related macular degeneration, bilateral, with active choroidal neovascularization: Secondary | ICD-10-CM

## 2021-10-14 LAB — URINALYSIS, ROUTINE W REFLEX MICROSCOPIC
Bilirubin Urine: NEGATIVE
Hgb urine dipstick: NEGATIVE
Ketones, ur: NEGATIVE
Nitrite: NEGATIVE
RBC / HPF: NONE SEEN (ref 0–?)
Specific Gravity, Urine: 1.015 (ref 1.000–1.030)
Total Protein, Urine: NEGATIVE
Urine Glucose: NEGATIVE
Urobilinogen, UA: 0.2 (ref 0.0–1.0)
pH: 5.5 (ref 5.0–8.0)

## 2021-10-15 ENCOUNTER — Telehealth: Payer: Self-pay | Admitting: Family

## 2021-10-15 DIAGNOSIS — R8290 Unspecified abnormal findings in urine: Secondary | ICD-10-CM

## 2021-10-15 NOTE — Telephone Encounter (Signed)
Spoke to patient and she stated that she dos not have  a way to get here  but when she does find a way she will contact us to set appt.

## 2021-10-15 NOTE — Telephone Encounter (Signed)
[name]  Can we add on urine culture to urine analysis?  FYI Jenate  If urine cannot be added on, please call pt and her come in to leave more urine

## 2021-10-15 NOTE — Telephone Encounter (Signed)
Urine only good for 24 hours. Will always need to be recollected if longer than that period of time.

## 2021-10-16 ENCOUNTER — Telehealth: Payer: Self-pay | Admitting: Family

## 2021-10-16 NOTE — Telephone Encounter (Signed)
Pt called stating that she need a refill on oxycodone sent to [facility]. Pt does not know if provider will prescribe the medication or not so she would like for the cma to call her

## 2021-10-17 NOTE — Telephone Encounter (Signed)
noted 

## 2021-10-17 NOTE — Telephone Encounter (Signed)
Patient states Jenate Martinique, CMA, is waiting for her phone call.  I transferred patient to [facility].

## 2021-10-17 NOTE — Telephone Encounter (Signed)
Spoke to patient and Patient verbalized understanding

## 2021-10-17 NOTE — Telephone Encounter (Signed)
Spoke to patient and informed her that we can not prescribe Oxycodone ongoing, And if Dr Nelva Bush is willing to take over and prescribe then that is fine. Patient displayed understanding so I Gave patient Dr. Nelva Bush  information. Patient stated that she would contact him.

## 2021-10-17 NOTE — Telephone Encounter (Signed)
I do not prescribe oxycodone on going. Yes, please ask her to ask Dr Nelva Bush to take over prescribing

## 2021-10-17 NOTE — Telephone Encounter (Deleted)
Spoke to patient and informed her that we can not prescribe Oxycodone ongoing, And if Dr [name] is willing to take over and prescribe then that is fine. Patient displayed understanding so I Gave patient Dr. Nelva [name]  information. Patient stated that she would contact him.

## 2021-11-03 ENCOUNTER — Telehealth: Payer: Self-pay | Admitting: Family

## 2021-11-03 DIAGNOSIS — G63 Polyneuropathy in diseases classified elsewhere: Secondary | ICD-10-CM

## 2021-11-03 MED ORDER — GABAPENTIN 300 MG PO CAPS: 300.0000 mg | ORAL_CAPSULE | Freq: Three times a day (TID) | ORAL | 3 refills | Status: DC

## 2021-11-03 NOTE — Telephone Encounter (Signed)
Spoke to patient and notified her that her refill for Gabapentin had been sent in to pharmacy

## 2021-11-03 NOTE — Telephone Encounter (Signed)
Patient called and said she has been trying to get her gabapentin (NEURONTIN) 300 MG capsule refilled for a week and that the pharmacy even called office. Patient is out and needs a refill.

## 2021-11-03 NOTE — Telephone Encounter (Signed)
Call pt Please let her know I refilled

## 2021-11-03 NOTE — Addendum Note (Signed)
Addended by: Burnard Hawthorne on: 11/03/2021 01:37 PM   Modules accepted: Orders

## 2021-11-04 ENCOUNTER — Telehealth: Payer: Self-pay

## 2021-11-04 NOTE — Telephone Encounter (Signed)
Patient states Brittany Paris, NP, prescribed gabapentin (NEURONTIN) 300 MG capsule for her.  Patient states while she was in rehab, [name] prescribed 400 MG of the same medication, since 300 MG was not working as well for patient.  Patient states she would like Brittany Paris, NP, to switch her prescription from 300 MG to 400 MG.  Patient states she would like for [place] to let her know when this has been done.  *Patient states her preferred pharmacy is [facility] in [place].

## 2021-11-05 NOTE — Telephone Encounter (Signed)
Call pt  Please confirm  Is she taking gabapentin '400mg'$  po TID?  Please advise her that gabapentin can cause excessive sedation particularly when taken with oxycodone.  ask her to let us know if dose is causing sedation.    Please confirm NO alcohol use.   If so , please refill gabapentin '400mg'$  PO TID  for 90 days with 1 refill  Please ensure she has follow up with me

## 2021-11-05 NOTE — Telephone Encounter (Signed)
Pt called in stating that NP Arnett prescribed her medication (gabapentin (NEURONTIN) 300 MG capsule)... Pt stated that her rehab provider prescribe medication (gabapentin (NEURONTIN) 400 MG capsule).... Pt stated that she ran out of the '400mg'$  and she went back to using the '300mg'$ .... Pt stated that the '300mg'$  of gabapentin doesn't help... Pt requesting for new script for medication (gabapentin (NEURONTIN) 400 MG capsule)... Pt requesting callback

## 2021-11-06 ENCOUNTER — Other Ambulatory Visit: Payer: Self-pay

## 2021-11-06 MED ORDER — GABAPENTIN 400 MG PO CAPS: 400.0000 mg | ORAL_CAPSULE | Freq: Three times a day (TID) | ORAL | 1 refills | Status: DC

## 2021-11-06 NOTE — Telephone Encounter (Signed)
Spoke to patient and confirmed that she is taking the Gabapentin 400 mg PO TID. Patient state that there is no alcohol use. Patient stated that if she does take the two simultaneously that she is usually lying down anyway.Refill sent in for Gabapentin 400 mg po TID FOR 90 DAYS WITH 1 REFILL

## 2021-11-10 ENCOUNTER — Encounter: Payer: Self-pay | Admitting: Urology

## 2021-11-10 ENCOUNTER — Ambulatory Visit: Payer: Medicare HMO | Admitting: Urology

## 2021-11-10 VITALS — BP 145/76 | HR 71 | Ht 60.0 in | Wt 118.0 lb

## 2021-11-10 DIAGNOSIS — N3281 Overactive bladder: Secondary | ICD-10-CM

## 2021-11-10 DIAGNOSIS — N3946 Mixed incontinence: Secondary | ICD-10-CM | POA: Diagnosis not present

## 2021-11-10 LAB — URINALYSIS, COMPLETE
Bilirubin, UA: NEGATIVE
Glucose, UA: NEGATIVE
Ketones, UA: NEGATIVE
Nitrite, UA: POSITIVE — AB
Specific Gravity, UA: 1.02 (ref 1.005–1.030)
Urobilinogen, Ur: 1 mg/dL (ref 0.2–1.0)
pH, UA: 6 (ref 5.0–7.5)

## 2021-11-10 LAB — MICROSCOPIC EXAMINATION: WBC, UA: >30 /hpf — AB (ref 0–5)

## 2021-11-10 MED ORDER — GEMTESA 75 MG PO TABS: 1.0000 | ORAL_TABLET | Freq: Every day | ORAL | 11 refills | Status: DC

## 2021-11-10 NOTE — Progress Notes (Signed)
11/10/2021 1:47 PM   [name] 12-30-1945 [id number]  Referring provider: Burnard Hawthorne, FNP [address] [place],  [place] [address]  Chief Complaint  Patient presents with   Over Active Bladder    New Patient    HPI: Was consulted to assess the patient's urinary continence.  She has urge incontinence.  She wears 2 pads during the day that are mildly wet.  She soaks a double pad at night.  No ankle edema.  Voids every 2-3 hours and has no nocturia.  Does not have stress incontinence.  She is having some issues moving her legs and has a wheelchair.  They are doing neurologic testing now.  No local perineal loss of sensation  She says she voids with a reasonable flow but then can touch her hip or buttock region and double void a small amount.  She does not do any splinting maneuvers  She is currently on Myrbetriq  Has not had a hysterectomy  No history of kidney stones bladder surgery or bladder infections.  Bowel movements normal   PMH: Past Medical History:  Diagnosis Date   Abnormal Pap smear of cervix    Dr. Prentice Docker    Coronary artery disease    a. 06/2009 MI/PCI to LCX and LAD; b. 01/2011 Occluded stents-->CABG x 3 ([facility]): LIMA->LAD, VG->LCX, VG->RPDA; c. 01/2012 Cath: LM 50, LAD 95ost, 15m, LCX 95ost, 158m, RCA 19m, RPDA small, LIMA->LAD ok, VG->LCX ok, VG->RPDA 100-->Med rx.   Degenerative disc disease, lumbar    L4/5 noted CT ab/pelvis 05/19/11    DVT (deep venous thrombosis) ([place])    after childbirth age 44   GERD (gastroesophageal reflux disease)    Hyperlipidemia    Hypertension    under control   Macular degeneration    Myocardial infarction [facility]) 2011   Parathyroid adenoma    right 13 x 8 mm s/p resection    PVD (peripheral vascular disease) ([place])    a. 01/2010 s/p Aortofemoral bypass.    Surgical History: Past Surgical History:  Procedure Laterality Date   CARDIAC CATHETERIZATION  2013   [facility]   CATARACT EXTRACTION      COLONOSCOPY     COLPOSCOPY     CORONARY ARTERY BYPASS GRAFT  02/07/2010   x 3   ERCP N/A 10/05/2019   Procedure: ENDOSCOPIC RETROGRADE CHOLANGIOPANCREATOGRAPHY (ERCP);  Surgeon: Lucilla Lame, MD;  Location: [facility] ENDOSCOPY;  Service: Endoscopy;  Laterality: N/A;   FACIAL COSMETIC SURGERY  2013   Dr. Nadeen Landau   FOOT SURGERY     right   ILIO-FEMORAL BYPASS GRAFT  2011   LIVER BIOPSY N/A 10/31/2019   Procedure: LIVER BIOPSY;  Surgeon: Olean Ree, MD;  Location: [facility] ORS;  Service: General;  Laterality: N/A;   MITRAL VALVE REPAIR  01/2010   ORIF ANKLE FRACTURE Right 10/08/2016   Procedure: OPEN REDUCTION INTERNAL FIXATION (ORIF) ANKLE FRACTURE;  Surgeon: Corky Mull, MD;  Location: [facility] ORS;  Service: Orthopedics;  Laterality: Right;   ORIF HUMERUS FRACTURE Right 12/20/2019   Procedure: OPEN REDUCTION INTERNAL FIXATION (ORIF) DISTAL HUMERUS FRACTURE;  Surgeon: Shona Needles, MD;  Location: [place];  Service: Orthopedics;  Laterality: Right;   ORIF WRIST FRACTURE Right 12/29/2018   Procedure: OPEN REDUCTION INTERNAL FIXATION (ORIF) WRIST FRACTURE;  Surgeon: Corky Mull, MD;  Location: [facility] ORS;  Service: Orthopedics;  Laterality: Right;   PARATHYROIDECTOMY     TUBAL LIGATION      Home Medications:  Allergies as of 11/10/2021   No Known Allergies      Medication List        Accurate as of November 10, 2021  1:47 PM. If you have any questions, ask your nurse or doctor.          aspirin EC 81 MG tablet Take 81 mg by mouth daily.   calcium carbonate 500 MG chewable tablet Commonly known as: TUMS - dosed in mg elemental calcium Chew 1 tablet (200 mg of elemental calcium total) by mouth 2 (two) times daily with a meal.   cyclobenzaprine 5 MG tablet Commonly known as: FLEXERIL Take 1 tablet (5 mg total) by mouth 3 (three) times daily as needed for muscle spasms.   ezetimibe 10 MG tablet Commonly known as: ZETIA Take 1 tablet (10 mg total) by mouth daily.   folic acid 1 MG  tablet Commonly known as: FOLVITE Take 5 tablets (5 mg total) by mouth daily.   gabapentin 400 MG capsule Commonly known as: Neurontin Take 1 capsule (400 mg total) by mouth 3 (three) times daily.   metoprolol tartrate 25 MG tablet Commonly known as: LOPRESSOR Take 0.5 tablets (12.5 mg total) by mouth 2 (two) times daily.   Myrbetriq 25 MG Tb24 tablet Generic drug: mirabegron ER Take 1 tablet (25 mg total) by mouth daily.   naloxone 4 MG/0.1ML Liqd nasal spray kit Commonly known as: NARCAN 4 or 8 mg (1 spray) intranasally every 2 to 3 minutes in alternating nostrils as needed. Seek immediate medical assistance after administration of the first dose.   Oxycodone HCl 10 MG Tabs Take 1 tablet (10 mg total) by mouth in the morning, at noon, and at bedtime. Please deliver   pantoprazole 40 MG tablet Commonly known as: PROTONIX Take 1 tablet (40 mg total) by mouth daily.   PRESERVISION AREDS 2 PO Take 1 capsule by mouth 2 (two) times daily.   RISA-BID PROBIOTIC PO Take 250 mg by mouth daily.   rosuvastatin 10 MG tablet Commonly known as: CRESTOR Take 1 tablet (10 mg total) by mouth at bedtime.   sertraline 50 MG tablet Commonly known as: ZOLOFT Take 1 tablet (50 mg total) by mouth daily.   Sleep Aid 25 MG tablet Generic drug: doxylamine (Sleep) Take 25 mg by mouth at bedtime as needed.   vitamin B-12 1000 MCG tablet Commonly known as: CYANOCOBALAMIN Take 1,000 mcg by mouth daily.   Vitamin D3 25 MCG (1000 UT) Caps Take 1,000 Units by mouth daily.        Allergies: No Known Allergies  Family History: Family History  Problem Relation Age of Onset   Heart disease Father     Social History:  reports that she quit smoking about 12 years ago. Her smoking use included cigarettes. She has a 44.00 pack-year smoking history. She has never used smokeless tobacco. She reports current alcohol use of about 1.0 standard drink of alcohol per week. She reports that she does  not use drugs.  ROS:                                        Physical Exam:   Laboratory Data: Lab Results  Component Value Date   WBC 6.1 09/12/2021   HGB 14.0 09/12/2021   HCT 41.9 09/12/2021   MCV 92 09/12/2021   PLT 196 09/12/2021    Lab Results  Component  Value Date   CREATININE 1.14 (H) 09/12/2021    No results found for: "PSA"  No results found for: "TESTOSTERONE"  Lab Results  Component Value Date   HGBA1C 4.8 09/20/2020    Urinalysis    Component Value Date/Time   COLORURINE YELLOW 10/13/2021 1436   APPEARANCEUR Cloudy (A) 10/13/2021 1436   APPEARANCEUR Clear 03/25/2017 1051   LABSPEC 1.015 10/13/2021 1436   LABSPEC 1.003 06/27/2011 1253   PHURINE 5.5 10/13/2021 1436   GLUCOSEU NEGATIVE 10/13/2021 1436   HGBUR NEGATIVE 10/13/2021 1436   BILIRUBINUR NEGATIVE 10/13/2021 1436   BILIRUBINUR Negative 03/25/2017 1051   BILIRUBINUR Negative 06/27/2011 1253   KETONESUR NEGATIVE 10/13/2021 1436   PROTEINUR NEGATIVE 11/07/2020 1619   UROBILINOGEN 0.2 10/13/2021 1436   NITRITE NEGATIVE 10/13/2021 1436   LEUKOCYTESUR MODERATE (A) 10/13/2021 1436   LEUKOCYTESUR Negative 06/27/2011 1253    Pertinent Imaging:   Assessment & Plan: Clinically patient describes high-volume bedwetting and urge incontinence during the day.  Has a risk factor for neurogenic bladder and both the bedwetting and leg symptoms started about 1 year ago.  Will come back on Gemtesa samples and prescription for pelvic examination cystoscopy.  She understands and may order a test in the future but because of her mobility I did not today Call if urine culture positive and get a residual next visit.  She understands that I will order the urodynamics if she is not greatly improved and the culture is negative but I did not explain in detail.  She had blood in the urine today but urine looked infected  1. OAB (overactive bladder)  - Urinalysis, Complete   No follow-ups on  file.  Reece Packer, MD  [facility] [address], [place], [place] [address] [phone]

## 2021-11-10 NOTE — Patient Instructions (Signed)

## 2021-11-13 LAB — CULTURE, URINE COMPREHENSIVE

## 2021-12-01 ENCOUNTER — Ambulatory Visit: Payer: Self-pay | Admitting: Urology

## 2021-12-05 ENCOUNTER — Other Ambulatory Visit: Payer: Self-pay | Admitting: Family

## 2021-12-16 ENCOUNTER — Encounter (INDEPENDENT_AMBULATORY_CARE_PROVIDER_SITE_OTHER): Payer: Medicare HMO | Admitting: Ophthalmology

## 2021-12-16 DIAGNOSIS — I10 Essential (primary) hypertension: Secondary | ICD-10-CM

## 2021-12-16 DIAGNOSIS — H353231 Exudative age-related macular degeneration, bilateral, with active choroidal neovascularization: Secondary | ICD-10-CM | POA: Diagnosis not present

## 2021-12-16 DIAGNOSIS — H43813 Vitreous degeneration, bilateral: Secondary | ICD-10-CM | POA: Diagnosis not present

## 2021-12-16 DIAGNOSIS — H35033 Hypertensive retinopathy, bilateral: Secondary | ICD-10-CM

## 2022-01-12 ENCOUNTER — Ambulatory Visit (INDEPENDENT_AMBULATORY_CARE_PROVIDER_SITE_OTHER): Payer: Medicare HMO | Admitting: Urology

## 2022-01-12 ENCOUNTER — Encounter: Payer: Self-pay | Admitting: Urology

## 2022-01-12 VITALS — BP 138/77 | HR 62 | Ht 60.0 in | Wt 118.0 lb

## 2022-01-12 DIAGNOSIS — N3281 Overactive bladder: Secondary | ICD-10-CM | POA: Diagnosis not present

## 2022-01-12 DIAGNOSIS — N3941 Urge incontinence: Secondary | ICD-10-CM

## 2022-01-12 DIAGNOSIS — N3946 Mixed incontinence: Secondary | ICD-10-CM

## 2022-01-12 LAB — URINALYSIS, COMPLETE
Bilirubin, UA: NEGATIVE
Glucose, UA: NEGATIVE
Ketones, UA: NEGATIVE
Nitrite, UA: POSITIVE — AB
Protein,UA: NEGATIVE
Specific Gravity, UA: 1.015 (ref 1.005–1.030)
Urobilinogen, Ur: 0.2 mg/dL (ref 0.2–1.0)
pH, UA: 6.5 (ref 5.0–7.5)

## 2022-01-12 LAB — MICROSCOPIC EXAMINATION: WBC, UA: >30 /hpf — AB (ref 0–5)

## 2022-01-12 NOTE — Addendum Note (Signed)
Addended by: Despina Hidden on: 01/12/2022 03:50 PM   Modules accepted: Orders

## 2022-01-12 NOTE — Progress Notes (Signed)
01/12/2022 2:25 PM   [name] 07-24-1945 [id number]  Referring provider: Allegra Grana, FNP [address] [place],  [place] [address]  Chief Complaint  Patient presents with   Cysto    HPI: Was consulted to assess the patient's urinary continence.  She has urge incontinence.  She wears 2 pads during the day that are mildly wet.  She soaks a double pad at night.  No ankle edema.  Voids every 2-3 hours and has no nocturia.  Does not have stress incontinence.  She is having some issues moving her legs and has a wheelchair.  They are doing neurologic testing now.  No local perineal loss of sensation   She says she voids with a reasonable flow but then can touch her hip or buttock region and double void a small amount.  She does not do any splinting maneuvers  She is currently on Myrbetriq  Has not had a hysterectomy   Clinically patient describes high-volume bedwetting and urge incontinence during the day.  Has a risk factor for neurogenic bladder and both the bedwetting and leg symptoms started about 1 year ago.    Will come back on Gemtesa samples and prescription for pelvic examination cystoscopy.  She understands and may order a test in the future but because of her mobility I did not today Call if urine culture positive and get a residual next visit.  She understands that I will order the urodynamics if she is not greatly improved and the culture is negative but I did not explain in detail.  She had blood in the urine today but urine looked infected   Today Frequency stable.  Last culture negative Urgency incontinence dramatically better on the Gemtesa.  Cystoscopy: Patient underwent flexible cystoscopy.  Bladder mucosa and trigone were normal.  No carcinoma.  Urine had a lot of white flecks and was aspirated and sent for culture     PMH: Past Medical History:  Diagnosis Date   Abnormal Pap smear of cervix    Dr. Thomasene Mohair    Coronary artery  disease    a. 06/2009 MI/PCI to LCX and LAD; b. 01/2011 Occluded stents-->CABG x 3 ([facility]): LIMA->LAD, VG->LCX, VG->RPDA; c. 01/2012 Cath: LM 50, LAD 95ost, 17m, LCX 95ost, 153m, RCA 45m, RPDA small, LIMA->LAD ok, VG->LCX ok, VG->RPDA 100-->Med rx.   Degenerative disc disease, lumbar    L4/5 noted CT ab/pelvis 05/19/11    DVT (deep venous thrombosis) (HCC)    after childbirth age 76   GERD (gastroesophageal reflux disease)    Hyperlipidemia    Hypertension    under control   Macular degeneration    Myocardial infarction [facility]) 2011   Parathyroid adenoma    right 13 x 8 mm s/p resection    PVD (peripheral vascular disease) (HCC)    a. 01/2010 s/p Aortofemoral bypass.    Surgical History: Past Surgical History:  Procedure Laterality Date   CARDIAC CATHETERIZATION  2013   [facility]   CATARACT EXTRACTION     COLONOSCOPY     COLPOSCOPY     CORONARY ARTERY BYPASS GRAFT  02/07/2010   x 3   ERCP N/A 10/05/2019   Procedure: ENDOSCOPIC RETROGRADE CHOLANGIOPANCREATOGRAPHY (ERCP);  Surgeon: Midge Minium, MD;  Location: [facility] ENDOSCOPY;  Service: Endoscopy;  Laterality: N/A;   FACIAL COSMETIC SURGERY  2013   Dr. Gertie Baron   FOOT SURGERY     right   ILIO-FEMORAL BYPASS GRAFT  2011   LIVER BIOPSY  N/A 10/31/2019   Procedure: LIVER BIOPSY;  Surgeon: Olean Ree, MD;  Location: [facility] ORS;  Service: General;  Laterality: N/A;   MITRAL VALVE REPAIR  01/2010   ORIF ANKLE FRACTURE Right 10/08/2016   Procedure: OPEN REDUCTION INTERNAL FIXATION (ORIF) ANKLE FRACTURE;  Surgeon: Corky Mull, MD;  Location: [facility] ORS;  Service: Orthopedics;  Laterality: Right;   ORIF HUMERUS FRACTURE Right 12/20/2019   Procedure: OPEN REDUCTION INTERNAL FIXATION (ORIF) DISTAL HUMERUS FRACTURE;  Surgeon: Shona Needles, MD;  Location: [place];  Service: Orthopedics;  Laterality: Right;   ORIF WRIST FRACTURE Right 12/29/2018   Procedure: OPEN REDUCTION INTERNAL FIXATION (ORIF) WRIST FRACTURE;  Surgeon: Corky Mull, MD;   Location: [facility] ORS;  Service: Orthopedics;  Laterality: Right;   PARATHYROIDECTOMY     TUBAL LIGATION      Home Medications:  Allergies as of 01/12/2022   No Known Allergies      Medication List        Accurate as of January 12, 2022  2:25 PM. If you have any questions, ask your nurse or doctor.          STOP taking these medications    Oxycodone HCl 10 MG Tabs Stopped by: Reece Packer, MD       TAKE these medications    aspirin EC 81 MG tablet Take 81 mg by mouth daily.   calcium carbonate 500 MG chewable tablet Commonly known as: TUMS - dosed in mg elemental calcium Chew 1 tablet (200 mg of elemental calcium total) by mouth 2 (two) times daily with a meal.   cyanocobalamin 1000 MCG tablet Commonly known as: VITAMIN B12 Take 1,000 mcg by mouth daily.   ezetimibe 10 MG tablet Commonly known as: ZETIA Take 1 tablet (10 mg total) by mouth daily.   folic acid 1 MG tablet Commonly known as: FOLVITE Take 5 tablets (5 mg total) by mouth daily.   gabapentin 400 MG capsule Commonly known as: NEURONTIN TAKE 1 CAPSULE BY MOUTH 3 TIMES DAILY   Gemtesa 75 MG Tabs Generic drug: Vibegron Take 1 tablet by mouth daily.   metoprolol tartrate 25 MG tablet Commonly known as: LOPRESSOR Take 0.5 tablets (12.5 mg total) by mouth 2 (two) times daily.   naloxone 4 MG/0.1ML Liqd nasal spray kit Commonly known as: NARCAN 4 or 8 mg (1 spray) intranasally every 2 to 3 minutes in alternating nostrils as needed. Seek immediate medical assistance after administration of the first dose.   pantoprazole 40 MG tablet Commonly known as: PROTONIX Take 1 tablet (40 mg total) by mouth daily.   PRESERVISION AREDS 2 PO Take 1 capsule by mouth 2 (two) times daily.   RISA-BID PROBIOTIC PO Take 250 mg by mouth daily.   rosuvastatin 10 MG tablet Commonly known as: CRESTOR Take 1 tablet (10 mg total) by mouth at bedtime.   sertraline 50 MG tablet Commonly known as:  ZOLOFT Take 1 tablet (50 mg total) by mouth daily.   Sleep Aid 25 MG tablet Generic drug: doxylamine (Sleep) Take 25 mg by mouth at bedtime as needed.   Vitamin D3 25 MCG (1000 UT) Caps Take 1,000 Units by mouth daily.   Xtampza ER 9 MG C12a Generic drug: oxyCODONE ER Take 1 capsule by mouth 2 (two) times daily.        Allergies: No Known Allergies  Family History: Family History  Problem Relation Age of Onset   Heart disease Father     Social History:  reports that she quit smoking about 12 years ago. Her smoking use included cigarettes. She has a 44.00 pack-year smoking history. She has been exposed to tobacco smoke. She has never used smokeless tobacco. She reports current alcohol use of about 1.0 standard drink of alcohol per week. She reports that she does not use drugs.  ROS:                                        Physical Exam: There were no vitals taken for this visit.  Constitutional:  Alert and oriented, No acute distress. HEENT: [place] AT, moist mucus membranes.  Trachea midline, no masses.   Laboratory Data: Lab Results  Component Value Date   WBC 6.1 09/12/2021   HGB 14.0 09/12/2021   HCT 41.9 09/12/2021   MCV 92 09/12/2021   PLT 196 09/12/2021    Lab Results  Component Value Date   CREATININE 1.14 (H) 09/12/2021    No results found for: "PSA"  No results found for: "TESTOSTERONE"  Lab Results  Component Value Date   HGBA1C 4.8 09/20/2020    Urinalysis    Component Value Date/Time   COLORURINE YELLOW 10/13/2021 1436   APPEARANCEUR Cloudy (A) 11/10/2021 1358   LABSPEC 1.015 10/13/2021 1436   LABSPEC 1.003 06/27/2011 1253   PHURINE 5.5 10/13/2021 1436   GLUCOSEU Negative 11/10/2021 1358   GLUCOSEU NEGATIVE 10/13/2021 1436   HGBUR NEGATIVE 10/13/2021 1436   BILIRUBINUR Negative 11/10/2021 1358   BILIRUBINUR Negative 06/27/2011 1253   KETONESUR NEGATIVE 10/13/2021 1436   PROTEINUR Trace (A) 11/10/2021 1358    PROTEINUR NEGATIVE 11/07/2020 1619   UROBILINOGEN 0.2 10/13/2021 1436   NITRITE Positive (A) 11/10/2021 1358   NITRITE NEGATIVE 10/13/2021 1436   LEUKOCYTESUR 3+ (A) 11/10/2021 1358   LEUKOCYTESUR MODERATE (A) 10/13/2021 1436   LEUKOCYTESUR Negative 06/27/2011 1253    Pertinent Imaging:   Assessment & Plan: Continue on Gemtesa.  Reevaluate in 4 months.  Call if culture positive  1. OAB (overactive bladder)  - Urinalysis, Complete   No follow-ups on file.  Reece Packer, MD  [facility] [address] [place], [place] [address] [phone]

## 2022-01-15 LAB — CULTURE, URINE COMPREHENSIVE

## 2022-01-16 ENCOUNTER — Other Ambulatory Visit: Payer: Self-pay | Admitting: Family

## 2022-01-19 ENCOUNTER — Telehealth: Payer: Self-pay | Admitting: *Deleted

## 2022-01-19 MED ORDER — NITROFURANTOIN MONOHYD MACRO 100 MG PO CAPS: 100.0000 mg | ORAL_CAPSULE | Freq: Two times a day (BID) | ORAL | 0 refills | Status: AC

## 2022-01-19 NOTE — Telephone Encounter (Signed)
-----   Message from Bjorn Loser, MD sent at 01/19/2022  8:34 AM EDT ----- Low-grade bladder infection Please call in Macrodantin 100 mg twice a day for 7 days ----- Message ----- From: Despina Hidden, CMA Sent: 01/14/2022  10:28 AM EDT To: Bjorn Loser, MD   ----- Message ----- From: Interface, [facility] Lab Results In Sent: 01/12/2022   4:36 PM EDT To: [facility]

## 2022-02-10 ENCOUNTER — Encounter (INDEPENDENT_AMBULATORY_CARE_PROVIDER_SITE_OTHER): Payer: Medicare HMO | Admitting: Ophthalmology

## 2022-02-11 ENCOUNTER — Encounter (INDEPENDENT_AMBULATORY_CARE_PROVIDER_SITE_OTHER): Payer: Medicare HMO | Admitting: Ophthalmology

## 2022-02-11 DIAGNOSIS — H43813 Vitreous degeneration, bilateral: Secondary | ICD-10-CM

## 2022-02-11 DIAGNOSIS — H35033 Hypertensive retinopathy, bilateral: Secondary | ICD-10-CM

## 2022-02-11 DIAGNOSIS — I10 Essential (primary) hypertension: Secondary | ICD-10-CM

## 2022-02-11 DIAGNOSIS — H353231 Exudative age-related macular degeneration, bilateral, with active choroidal neovascularization: Secondary | ICD-10-CM | POA: Diagnosis not present

## 2022-04-07 ENCOUNTER — Telehealth: Payer: Self-pay | Admitting: Family

## 2022-04-07 NOTE — Telephone Encounter (Signed)
Pt would like to be called regards to her physical therapy.

## 2022-04-07 NOTE — Telephone Encounter (Signed)
Form was given to provider and signed and has been faxed already.

## 2022-04-08 ENCOUNTER — Encounter (INDEPENDENT_AMBULATORY_CARE_PROVIDER_SITE_OTHER): Payer: Medicare HMO | Admitting: Ophthalmology

## 2022-04-08 ENCOUNTER — Encounter: Payer: Self-pay | Admitting: Family

## 2022-04-08 ENCOUNTER — Encounter (INDEPENDENT_AMBULATORY_CARE_PROVIDER_SITE_OTHER): Payer: Self-pay

## 2022-04-08 ENCOUNTER — Telehealth: Payer: Self-pay | Admitting: Family

## 2022-04-08 ENCOUNTER — Telehealth (INDEPENDENT_AMBULATORY_CARE_PROVIDER_SITE_OTHER): Payer: Medicare HMO | Admitting: Family

## 2022-04-08 DIAGNOSIS — R197 Diarrhea, unspecified: Secondary | ICD-10-CM | POA: Diagnosis not present

## 2022-04-08 DIAGNOSIS — R195 Other fecal abnormalities: Secondary | ICD-10-CM

## 2022-04-08 DIAGNOSIS — E538 Deficiency of other specified B group vitamins: Secondary | ICD-10-CM | POA: Insufficient documentation

## 2022-04-08 NOTE — Patient Instructions (Addendum)
Start b12 1053mg daily at home for low b12.   Stop imodium until we have stool culture back.  To bulk stool, start Metamucil 3.4 g daily up to 3 times daily as needed to bulk store.   Please come pick up stool container and then returns specimen to our office.  This will evaluate for bacterial etiology.    Please also call [facility] schedule non fasting labs.   Food Choices to Help Relieve Diarrhea, Adult Diarrhea can make you feel weak and cause you to become dehydrated. Dehydration is a condition in which there is not enough water or other fluids in the body. It is important to choose the right foods and drinks to: Relieve diarrhea. Replace lost fluids and nutrients. Prevent dehydration. What are tips for following this plan? Relieving diarrhea Avoid foods that make your diarrhea worse. These may include: Foods and drinks that are sweetened with high-fructose corn syrup, honey, or sweeteners such as xylitol, sorbitol, and mannitol. Check food labels for these ingredients. Fried, greasy, or spicy foods. Raw fruits and vegetables. Eat foods that are rich in probiotics. These include foods such as yogurt and fermented milk products. Probiotics can help increase healthy bacteria in your stomach and intestines (gastrointestinal or GI tract). This may help digestion and stop diarrhea. If you have lactose intolerance, avoid dairy products. These may make your diarrhea worse. Take medicine to help stop diarrhea only as told by your health care provider. Replacing nutrients  Eat bland, easy-to-digest foods in small amounts as you are able, until your diarrhea starts to get better. These foods include bananas, applesauce, rice, toast, and crackers. Over time, add nutrient-rich foods as your body tolerates them or as told by your health care provider. These include: Well-cooked protein foods, such as eggs, lean meats like fish or chicken without skin, and tofu. Peeled, seeded, and soft-cooked fruits  and vegetables. Low-fat dairy products. Whole grains. Take vitamin and mineral supplements as told by your health care provider. Preventing dehydration  Start by sipping water or a solution to prevent dehydration (oral rehydration solution, or ORS). This is a drink that helps replace fluids and minerals your body has lost. You can buy an ORS at pharmacies and retail stores. Try to drink at least 8-10 cups (2,000-2,500 mL) of fluid each day to help replace lost fluids. If your urine is pale yellow, you are getting enough fluids. You may drink other liquids in addition to water, such as fruit juice that you have added water to (diluted fruit juice) or low-calorie sports drinks, as tolerated or as told by your health care provider. Avoid drinks with caffeine, such as coffee, tea, or soft drinks. Avoid alcohol. This information is not intended to replace advice given to you by your health care provider. Make sure you discuss any questions you have with your health care provider. Document Revised: 09/23/2021 Document Reviewed: 09/23/2021 Elsevier Patient Education  2[place]

## 2022-04-08 NOTE — Assessment & Plan Note (Signed)
Patient appears nontoxic over virtual today.  She denies fevers or abdominal pain.  Difficult to assess over virtual t; patient had transportation issue last minute and appointment had to be made virtually.  Based on duration, advised  stool cultures obtained.  Patient understands to come to our office to pick up container to be returned.  Advised to avoid flavored popcorn as I am suspicious of sugar content and perhaps this has aggravated diarrhea.  Advised to stop Imodium until stool culture has been returned.  Start Metamucil.  Advised patient if you develop abdominal pain fever or unable to stay hydrated, she must report to urgent care or emergency room for in person evaluation.  She verbalized understanding

## 2022-04-08 NOTE — Assessment & Plan Note (Signed)
Advised patient to start B12 1000 mcg over-the-counter.  Advised to call to schedule lab appointment to further evaluate for celiac disease, pernicious anemia

## 2022-04-08 NOTE — Telephone Encounter (Signed)
Spoke with patient to sched her Awv she stated to call back she was sick

## 2022-04-08 NOTE — Progress Notes (Signed)
Virtual Visit via Video Note  I connected with [name] on 04/08/22 at  3:30 PM EST by a video enabled telemedicine application and verified that I am speaking with the correct person using two identifiers. Location patient: home Location provider: work  Persons participating in the virtual visit: patient, provider  I discussed the limitations of evaluation and management by telemedicine and the availability of in person appointments. The patient expressed understanding and agreed to proceed.  HPI: Complains of intermittent non bloody loose stool past couple of weeks , approx 2-3 weeks.  Improved today after starting imodium . She has one solid BM.  Diarrhea had been occurring 1-2 per day.  It does not occur after a meal No abdominal pain, constipation.   Eating and drinking well.  No caffeine or artificial sugars.  She noticed diarrhea after eating flavored popcorn and since stopped eating and started imodium.  Taking Pepto-Bismol without relief.  No recent antibiotic.   She tries to do 'as much walking as I can'.  She continues PT to her home.  She is started b12 at home. She denies no leg pain, numbness. Once in a while, her feet will get numb. Uses rollator or wheelchair. She doesn't walk without assistance device.  Initial consult Dr. Manuella Ghazi 03/25/22 in regards to bilateral lower extremity weakness. Ordered NCS ( not done yet)  B12 287 A1c 5.5  Sed 11  Colonoscopy with internal hemorrhoid, diverticulosis. 11/04/2012    ROS: See pertinent positives and negatives per HPI.  EXAM:  VITALS per patient if applicable: There were no vitals taken for this visit. BP Readings from Last 3 Encounters:  01/12/22 138/77  11/10/21 (!) 145/76  09/12/21 124/66   Wt Readings from Last 3 Encounters:  01/12/22 118 lb (53.5 kg)  11/10/21 118 lb (53.5 kg)  09/12/21 119 lb (54 kg)    GENERAL: alert, oriented, appears well and in no acute distress  HEENT: atraumatic, conjunttiva  clear, no obvious abnormalities on inspection of external nose and ears  NECK: normal movements of the head and neck  LUNGS: on inspection no signs of respiratory distress, breathing rate appears normal, no obvious gross SOB, gasping or wheezing  CV: no obvious cyanosis  MS: moves all visible extremities without noticeable abnormality  PSYCH/NEURO: pleasant and cooperative, no obvious depression or anxiety, speech and thought processing grossly intact  ASSESSMENT AND PLAN: Loose stools -     GI pathogen panel by PCR, stool; Future  B12 deficiency Assessment & Plan: Advised patient to start B12 1000 mcg over-the-counter.  Advised to call to schedule lab appointment to further evaluate for celiac disease, pernicious anemia  Orders: -     Intrinsic Factor Antibodies; Future -     Homocysteine; Future -     Methylmalonic acid, serum; Future -     Celiac Disease Ab Screen w/Rfx; Future  Diarrhea, unspecified type Assessment & Plan: Patient appears nontoxic over virtual today.  She denies fevers or abdominal pain.  Difficult to assess over virtual t; patient had transportation issue last minute and appointment had to be made virtually.  Based on duration, advised  stool cultures obtained.  Patient understands to come to our office to pick up container to be returned.  Advised to avoid flavored popcorn as I am suspicious of sugar content and perhaps this has aggravated diarrhea.  Advised to stop Imodium until stool culture has been returned.  Start Metamucil.  Advised patient if you develop abdominal pain fever or unable  to stay hydrated, she must report to urgent care or emergency room for in person evaluation.  She verbalized understanding      -we discussed possible serious and likely etiologies, options for evaluation and workup, limitations of telemedicine visit vs in person visit, treatment, treatment risks and precautions. Pt prefers to treat via telemedicine empirically rather then  risking or undertaking an in person visit at this moment.    I discussed the assessment and treatment plan with the patient. The patient was provided an opportunity to ask questions and all were answered. The patient agreed with the plan and demonstrated an understanding of the instructions.   The patient was advised to call back or seek an in-person evaluation if the symptoms worsen or if the condition fails to improve as anticipated.  Advised if desired AVS can be mailed or viewed via [place] if [place] user.   [name], FNP

## 2022-04-17 ENCOUNTER — Other Ambulatory Visit: Payer: Self-pay | Admitting: Family

## 2022-04-17 ENCOUNTER — Telehealth: Payer: Self-pay | Admitting: Family

## 2022-04-17 DIAGNOSIS — I10 Essential (primary) hypertension: Secondary | ICD-10-CM

## 2022-04-17 MED ORDER — METOPROLOL TARTRATE 25 MG PO TABS: 12.5000 mg | ORAL_TABLET | Freq: Two times a day (BID) | ORAL | 0 refills | Status: DC

## 2022-04-17 NOTE — Telephone Encounter (Signed)
Prescription Request  04/17/2022  Is this a "Controlled Substance" medicine? No  LOV: Visit date not found  What is the name of the medication or equipment? metoprolol tartrate (LOPRESSOR) 25 MG tablet  Have you contacted your pharmacy to request a refill? Yes   Which pharmacy would you like this sent to?    [place], [place] [address] Phone: [phone] Fax: [phone]    Patient notified that their request is being sent to the clinical staff for review and that they should receive a response within 2 business days.   Please advise at Mobile [phone] (mobile)

## 2022-04-28 ENCOUNTER — Encounter (INDEPENDENT_AMBULATORY_CARE_PROVIDER_SITE_OTHER): Payer: Medicare HMO | Admitting: Ophthalmology

## 2022-04-28 DIAGNOSIS — H353231 Exudative age-related macular degeneration, bilateral, with active choroidal neovascularization: Secondary | ICD-10-CM | POA: Diagnosis not present

## 2022-04-28 DIAGNOSIS — I10 Essential (primary) hypertension: Secondary | ICD-10-CM

## 2022-04-28 DIAGNOSIS — H35033 Hypertensive retinopathy, bilateral: Secondary | ICD-10-CM

## 2022-04-28 DIAGNOSIS — H43813 Vitreous degeneration, bilateral: Secondary | ICD-10-CM | POA: Diagnosis not present

## 2022-05-11 ENCOUNTER — Ambulatory Visit (INDEPENDENT_AMBULATORY_CARE_PROVIDER_SITE_OTHER): Payer: Medicare HMO | Admitting: Urology

## 2022-05-11 ENCOUNTER — Other Ambulatory Visit: Payer: Self-pay | Admitting: Family

## 2022-05-11 DIAGNOSIS — N3941 Urge incontinence: Secondary | ICD-10-CM

## 2022-05-11 DIAGNOSIS — N3946 Mixed incontinence: Secondary | ICD-10-CM

## 2022-05-11 DIAGNOSIS — R3915 Urgency of urination: Secondary | ICD-10-CM

## 2022-05-11 DIAGNOSIS — N3281 Overactive bladder: Secondary | ICD-10-CM

## 2022-05-11 MED ORDER — GEMTESA 75 MG PO TABS: 1.0000 | ORAL_TABLET | Freq: Every day | ORAL | 11 refills | Status: DC

## 2022-05-11 NOTE — Progress Notes (Signed)
05/11/2022 1:28 PM   [name] 05-Jul-1945 [id number]  Referring provider: Burnard Hawthorne, FNP [address] [place],  [place] [address]  No chief complaint on file.   HPI: Was consulted to assess the patient's urinary incontinence.  She has urge incontinence.  She wears 2 pads during the day that are mildly wet.  She soaks a double pad at night.  No ankle edema.  Voids every 2-3 hours and has no nocturia.  Does not have stress incontinence.  She is having some issues moving her legs and has a wheelchair.  They are doing neurologic testing now.  No local perineal loss of sensation   She says she voids with a reasonable flow but then can touch her hip or buttock region and double void a small amount.  She does not do any splinting maneuvers  She is currently on Myrbetriq  Has not had a hysterectomy    Clinically patient describes high-volume bedwetting and urge incontinence during the day.  Has a risk factor for neurogenic bladder and both the bedwetting and leg symptoms started about 1 year ago.    Will come back on Gemtesa samples and prescription for pelvic examination cystoscopy.  She understands and may order a test in the future but because of her mobility I did not today Call if urine culture positive and get a residual next visit.  She understands that I will order the urodynamics if she is not greatly improved and the culture is negative but I did not explain in detail.  She had blood in the urine today but urine looked infected    Urgency incontinence dramatically better on the Gemtesa.   Cystoscopy: Patient underwent flexible cystoscopy.  Bladder mucosa and trigone were normal.  No carcinoma.  Urine had a lot of white flecks   Continue on Gemtesa. Reevaluate in 4 months. Call if culture positive   Today Frequency stable.  Last culture positive. Clinically not infected today.  I think overall she does well with Gemtesa but I think she still leaks some.   She does not want to go and get urodynamics.    PMH: Past Medical History:  Diagnosis Date   Abnormal Pap smear of cervix    Dr. Prentice Docker    Coronary artery disease    a. 06/2009 MI/PCI to LCX and LAD; b. 01/2011 Occluded stents-->CABG x 3 ([facility]): LIMA->LAD, VG->LCX, VG->RPDA; c. 01/2012 Cath: LM 50, LAD 95ost, 179m LCX 95ost, 1060mRCA 3024mPDA small, LIMA->LAD ok, VG->LCX ok, VG->RPDA 100-->Med rx.   Degenerative disc disease, lumbar    L4/5 noted CT ab/pelvis 05/19/11    DV[facility]deep [place] thrombosis) ([facility]  after [place] age [age]GERD (gastroesophageal reflux disease)    Hyperlipidemia    Hypertension    under control   Macular degeneration    [facility]   Parathyroid adenoma    right 13 x 8 mm s/p resection    P[place]peripheral vascular disease) ([facility]  a. 01/2010 s/p Aortofemoral bypass.    Surgical History: Past Surgical History:  Procedure Laterality Date   [facility]ATION  2013   [facility]     COLONOSCOPY     COLPOSCOPY     CORONARY ARTERY BYPASS GRAFT  02/07/2010   x 3   ERCP N/A 10/05/2019   Procedure: ENDOSCOPIC RETROGRADE CHOLANGIOPANCREATOGRAPHY (ERCP);  Surgeon: WohLucilla LameD;  [facility];  Service: Endoscopy;  Laterality: N/A;   FACIAL COSMETIC SURGERY  2013   Dr. Nadeen Landau   FOOT SURGERY     right   ILIO-FEMORAL BYPASS GRAFT  2011   LIVER BIOPSY N/A 10/31/2019   Procedure: LIVER BIOPSY;  Surgeon: Olean Ree, MD;  Location: [facility] ORS;  Service: General;  Laterality: N/A;   MITRAL VALVE REPAIR  01/2010   ORIF ANKLE FRACTURE Right 10/08/2016   Procedure: OPEN REDUCTION INTERNAL FIXATION (ORIF) ANKLE FRACTURE;  Surgeon: Corky Mull, MD;  Location: [facility] ORS;  Service: Orthopedics;  Laterality: Right;   ORIF HUMERUS FRACTURE Right 12/20/2019   Procedure: OPEN REDUCTION INTERNAL FIXATION (ORIF) DISTAL HUMERUS FRACTURE;  Surgeon: Shona Needles, MD;  Location: [facility];  Service:  Orthopedics;  Laterality: Right;   ORIF WRIST FRACTURE Right 12/29/2018   Procedure: OPEN REDUCTION INTERNAL FIXATION (ORIF) WRIST FRACTURE;  Surgeon: Corky Mull, MD;  Location: [facility] ORS;  Service: Orthopedics;  Laterality: Right;   PARATHYROIDECTOMY     TUBAL LIGATION      Home Medications:  Allergies as of 05/11/2022   No Known Allergies      Medication List        Accurate as of May 11, 2022  1:28 PM. If you have any questions, ask your nurse or doctor.          aspirin EC 81 MG tablet Take 81 mg by mouth daily.   calcium carbonate 500 MG chewable tablet Commonly known as: TUMS - dosed in mg elemental calcium Chew 1 tablet (200 mg of elemental calcium total) by mouth 2 (two) times daily with a meal.   cyanocobalamin 1000 MCG tablet Commonly known as: VITAMIN B12 Take 1,000 mcg by mouth daily.   ezetimibe 10 MG tablet Commonly known as: ZETIA Take 1 tablet (10 mg total) by mouth daily.   folic acid 1 MG tablet Commonly known as: FOLVITE Take 5 tablets (5 mg total) by mouth daily.   gabapentin 400 MG capsule Commonly known as: NEURONTIN TAKE 1 CAPSULE BY MOUTH 3 TIMES DAILY   Gemtesa 75 MG Tabs Generic drug: Vibegron Take 1 tablet by mouth daily.   metoprolol tartrate 25 MG tablet Commonly known as: LOPRESSOR Take 0.5 tablets (12.5 mg total) by mouth 2 (two) times daily. Further refills from Dr Rockey Situ   naloxone 4 MG/0.1ML Liqd nasal spray kit Commonly known as: NARCAN 4 or 8 mg (1 spray) intranasally every 2 to 3 minutes in alternating nostrils as needed. Seek immediate medical assistance after administration of the first dose.   pantoprazole 40 MG tablet Commonly known as: PROTONIX Take 1 tablet (40 mg total) by mouth daily.   PRESERVISION AREDS 2 PO Take 1 capsule by mouth 2 (two) times daily.   RISA-BID PROBIOTIC PO Take 250 mg by mouth daily.   rosuvastatin 10 MG tablet Commonly known as: CRESTOR Take 1 tablet (10 mg total) by mouth at  bedtime.   sertraline 50 MG tablet Commonly known as: ZOLOFT Take 1 tablet (50 mg total) by mouth daily.   Sleep Aid 25 MG tablet Generic drug: doxylamine (Sleep) Take 25 mg by mouth at bedtime as needed.   Vitamin D3 25 MCG (1000 UT) Caps Take 1,000 Units by mouth daily.   Xtampza ER 9 MG C12a Generic drug: oxyCODONE ER Take 1 capsule by mouth 2 (two) times daily.        Allergies: No Known Allergies  Family History: Family History  Problem Relation Age of Onset  Heart disease Father     Social History:  reports that she quit smoking about 12 years ago. Her smoking use included cigarettes. She has a 44.00 pack-year smoking history. She has been exposed to tobacco smoke. She has never used smokeless tobacco. She reports current alcohol use of about 1.0 standard drink of alcohol per week. She reports that she does not use drugs.  ROS:                                        Physical Exam: There were no vitals taken for this visit.  Constitutional:  Alert and oriented, No acute distress. HEENT: [place] AT, moist mucus membranes.  Trachea midline, no masses.   Laboratory Data: Lab Results  Component Value Date   WBC 6.1 09/12/2021   HGB 14.0 09/12/2021   HCT 41.9 09/12/2021   MCV 92 09/12/2021   PLT 196 09/12/2021    Lab Results  Component Value Date   CREATININE 1.14 (H) 09/12/2021    No results found for: "PSA"  No results found for: "TESTOSTERONE"  Lab Results  Component Value Date   HGBA1C 4.8 09/20/2020    Urinalysis    Component Value Date/Time   COLORURINE YELLOW 10/13/2021 1436   APPEARANCEUR Hazy (A) 01/12/2022 1452   LABSPEC 1.015 10/13/2021 1436   LABSPEC 1.003 06/27/2011 1253   PHURINE 5.5 10/13/2021 1436   GLUCOSEU Negative 01/12/2022 1452   GLUCOSEU NEGATIVE 10/13/2021 1436   HGBUR NEGATIVE 10/13/2021 1436   BILIRUBINUR Negative 01/12/2022 1452   BILIRUBINUR Negative 06/27/2011 1253   KETONESUR NEGATIVE  10/13/2021 1436   PROTEINUR Negative 01/12/2022 1452   PROTEINUR NEGATIVE 11/07/2020 1619   UROBILINOGEN 0.2 10/13/2021 1436   NITRITE Positive (A) 01/12/2022 1452   NITRITE NEGATIVE 10/13/2021 1436   LEUKOCYTESUR 2+ (A) 01/12/2022 1452   LEUKOCYTESUR MODERATE (A) 10/13/2021 1436   LEUKOCYTESUR Negative 06/27/2011 1253    Pertinent Imaging:   Assessment & Plan: Gemtesa 90 x 3 sent to pharmacy and I will see in 1 year.  We will try to get a culture and I will call her if positive  1. OAB (overactive bladder)  - Urinalysis, Complete   No follow-ups on file.  Reece Packer, MD  [facility] [address] [place], [place] [address] [phone]

## 2022-05-15 ENCOUNTER — Telehealth: Payer: Self-pay | Admitting: Family

## 2022-05-15 NOTE — Telephone Encounter (Signed)
Left message for patient to call back and schedule Medicare Annual Wellness Visit (AWV) .   Please offer to do virtually or by telephone.  Left office number and my jabber [phone].  Last AWV:04/23/2021   Please schedule at anytime with Nurse Health Advisor.

## 2022-05-19 ENCOUNTER — Other Ambulatory Visit: Payer: Self-pay | Admitting: Anesthesiology

## 2022-05-19 DIAGNOSIS — G8921 Chronic pain due to trauma: Secondary | ICD-10-CM

## 2022-05-19 DIAGNOSIS — W1839XS Other fall on same level, sequela: Secondary | ICD-10-CM

## 2022-05-19 DIAGNOSIS — M545 Low back pain, unspecified: Secondary | ICD-10-CM

## 2022-05-26 ENCOUNTER — Other Ambulatory Visit: Payer: Medicare HMO

## 2022-05-28 ENCOUNTER — Ambulatory Visit
Admission: RE | Admit: 2022-05-28 | Discharge: 2022-05-28 | Disposition: A | Discharge status: Home / Self Care | Payer: Medicare HMO | Source: Ambulatory Visit | Attending: Anesthesiology | Admitting: Anesthesiology

## 2022-05-28 DIAGNOSIS — M47816 Spondylosis without myelopathy or radiculopathy, lumbar region: Secondary | ICD-10-CM | POA: Insufficient documentation

## 2022-05-28 DIAGNOSIS — G8921 Chronic pain due to trauma: Secondary | ICD-10-CM | POA: Insufficient documentation

## 2022-05-28 DIAGNOSIS — M5136 Other intervertebral disc degeneration, lumbar region: Secondary | ICD-10-CM | POA: Insufficient documentation

## 2022-05-28 DIAGNOSIS — W1839XS Other fall on same level, sequela: Secondary | ICD-10-CM | POA: Insufficient documentation

## 2022-05-28 DIAGNOSIS — M545 Low back pain, unspecified: Secondary | ICD-10-CM | POA: Diagnosis present

## 2022-05-28 DIAGNOSIS — M48061 Spinal stenosis, lumbar region without neurogenic claudication: Secondary | ICD-10-CM | POA: Insufficient documentation

## 2022-05-28 MED ORDER — GADOBUTROL 1 MMOL/ML IV SOLN
6.0000 mL | Freq: Once | INTRAVENOUS | Status: AC | PRN
  Administered 2022-05-28: 6 mL via INTRAVENOUS

## 2022-05-29 ENCOUNTER — Other Ambulatory Visit: Payer: Self-pay | Admitting: Family

## 2022-05-29 DIAGNOSIS — I10 Essential (primary) hypertension: Secondary | ICD-10-CM

## 2022-06-02 ENCOUNTER — Telehealth: Payer: Self-pay

## 2022-06-02 NOTE — Telephone Encounter (Signed)
Left voicemail for patient asking her to please call us.  When patient calls, we need to schedule follow-up appointments from her MyChart Visit with [name], FNP, on 04/08/2022.  [name] would like to see patient for a 49-monthfollow-up for Chronic Management and non-fasting labs in 2-3 weeks (from MyChart Visit date).

## 2022-06-11 ENCOUNTER — Telehealth: Payer: Self-pay | Admitting: Family

## 2022-06-11 NOTE — Telephone Encounter (Signed)
Called patient to schedule Medicare Annual Wellness Visit (AWV). Left message for patient to call back and schedule Medicare Annual Wellness Visit (AWV).  Last date of AWV: 04/23/2021   Please schedule an appointment at any time with Denisa.  If any questions, please contact me at [phone].    Thank you,  [place] Direct dial  [phone]

## 2022-06-18 DIAGNOSIS — F5101 Primary insomnia: Secondary | ICD-10-CM

## 2022-06-18 DIAGNOSIS — M5136 Other intervertebral disc degeneration, lumbar region: Secondary | ICD-10-CM

## 2022-06-18 DIAGNOSIS — G9009 Other idiopathic peripheral autonomic neuropathy: Secondary | ICD-10-CM

## 2022-06-18 DIAGNOSIS — Z9181 History of falling: Secondary | ICD-10-CM

## 2022-06-18 DIAGNOSIS — I2510 Atherosclerotic heart disease of native coronary artery without angina pectoris: Secondary | ICD-10-CM

## 2022-06-18 DIAGNOSIS — E782 Mixed hyperlipidemia: Secondary | ICD-10-CM

## 2022-06-18 DIAGNOSIS — H353290 Exudative age-related macular degeneration, unspecified eye, stage unspecified: Secondary | ICD-10-CM

## 2022-06-18 DIAGNOSIS — F32A Depression, unspecified: Secondary | ICD-10-CM

## 2022-06-18 DIAGNOSIS — Z87891 Personal history of nicotine dependence: Secondary | ICD-10-CM

## 2022-06-18 DIAGNOSIS — I10 Essential (primary) hypertension: Secondary | ICD-10-CM

## 2022-06-18 DIAGNOSIS — Z951 Presence of aortocoronary bypass graft: Secondary | ICD-10-CM

## 2022-06-18 DIAGNOSIS — Z7982 Long term (current) use of aspirin: Secondary | ICD-10-CM

## 2022-06-18 DIAGNOSIS — I739 Peripheral vascular disease, unspecified: Secondary | ICD-10-CM

## 2022-06-18 DIAGNOSIS — D518 Other vitamin B12 deficiency anemias: Secondary | ICD-10-CM

## 2022-06-18 DIAGNOSIS — I252 Old myocardial infarction: Secondary | ICD-10-CM

## 2022-06-18 DIAGNOSIS — K219 Gastro-esophageal reflux disease without esophagitis: Secondary | ICD-10-CM

## 2022-06-18 DIAGNOSIS — Z86718 Personal history of other venous thrombosis and embolism: Secondary | ICD-10-CM

## 2022-06-18 DIAGNOSIS — M810 Age-related osteoporosis without current pathological fracture: Secondary | ICD-10-CM

## 2022-06-18 DIAGNOSIS — E46 Unspecified protein-calorie malnutrition: Secondary | ICD-10-CM

## 2022-06-24 ENCOUNTER — Telehealth: Payer: Self-pay | Admitting: Family

## 2022-06-24 NOTE — Telephone Encounter (Signed)
Contacted [name] to schedule their annual wellness visit. Appointment made for 07/02/2022.  Thank you,  [place] Direct dial  [phone]

## 2022-07-02 ENCOUNTER — Telehealth: Payer: Self-pay

## 2022-07-02 NOTE — Telephone Encounter (Signed)
No answer when called for scheduled AWV. Left message. Okay to reschedule.  

## 2022-07-10 ENCOUNTER — Telehealth: Payer: Self-pay

## 2022-07-10 NOTE — Telephone Encounter (Signed)
This nurse attempted to call patient three times for AWV. Message left that we will call again to reschedule. 

## 2022-07-14 ENCOUNTER — Encounter (INDEPENDENT_AMBULATORY_CARE_PROVIDER_SITE_OTHER): Payer: Medicare HMO | Admitting: Ophthalmology

## 2022-07-14 DIAGNOSIS — I10 Essential (primary) hypertension: Secondary | ICD-10-CM | POA: Diagnosis not present

## 2022-07-14 DIAGNOSIS — H43813 Vitreous degeneration, bilateral: Secondary | ICD-10-CM

## 2022-07-14 DIAGNOSIS — H35033 Hypertensive retinopathy, bilateral: Secondary | ICD-10-CM

## 2022-07-14 DIAGNOSIS — H353231 Exudative age-related macular degeneration, bilateral, with active choroidal neovascularization: Secondary | ICD-10-CM

## 2022-08-05 ENCOUNTER — Encounter: Payer: Self-pay | Admitting: *Deleted

## 2022-08-05 ENCOUNTER — Emergency Department: Payer: Medicare HMO

## 2022-08-05 ENCOUNTER — Other Ambulatory Visit: Payer: Self-pay

## 2022-08-05 ENCOUNTER — Emergency Department
Admission: EM | Admit: 2022-08-05 | Discharge: 2022-08-05 | Disposition: A | Discharge status: Home / Self Care | Payer: Medicare HMO | Attending: Emergency Medicine | Admitting: Emergency Medicine

## 2022-08-05 DIAGNOSIS — I2510 Atherosclerotic heart disease of native coronary artery without angina pectoris: Secondary | ICD-10-CM | POA: Diagnosis not present

## 2022-08-05 DIAGNOSIS — R6884 Jaw pain: Secondary | ICD-10-CM | POA: Diagnosis present

## 2022-08-05 DIAGNOSIS — I10 Essential (primary) hypertension: Secondary | ICD-10-CM | POA: Diagnosis not present

## 2022-08-05 LAB — CBC
HCT: 44.1 % (ref 36.0–46.0)
Hemoglobin: 14.6 g/dL (ref 12.0–15.0)
MCH: 32 pg (ref 26.0–34.0)
MCHC: 33.1 g/dL (ref 30.0–36.0)
MCV: 96.7 fL (ref 80.0–100.0)
Platelets: 213 10*3/uL (ref 150–400)
RBC: 4.56 MIL/uL (ref 3.87–5.11)
RDW: 14.2 % (ref 11.5–15.5)
WBC: 7.1 10*3/uL (ref 4.0–10.5)
nRBC: 0 % (ref 0.0–0.2)

## 2022-08-05 LAB — BASIC METABOLIC PANEL
Anion gap: 8 (ref 5–15)
BUN: 11 mg/dL (ref 8–23)
CO2: 25 mmol/L (ref 22–32)
Calcium: 9.7 mg/dL (ref 8.9–10.3)
Chloride: 103 mmol/L (ref 98–111)
Creatinine, Ser: 0.98 mg/dL (ref 0.44–1.00)
GFR, Estimated: >60 mL/min (ref >60)
Glucose, Bld: 143 mg/dL — ABNORMAL HIGH (ref 70–99)
Potassium: 3.9 mmol/L (ref 3.5–5.1)
Sodium: 136 mmol/L (ref 135–145)

## 2022-08-05 LAB — TROPONIN I (HIGH SENSITIVITY): Troponin I (High Sensitivity): 4 ng/L (ref <18)

## 2022-08-05 NOTE — ED Triage Notes (Signed)
Pt reports onset of jaw pain today.  No chest pain or sob.  No n/v.  Pt denies injury to jaw.  No diff swallowing or talking.  Pt alert  speech clear.

## 2022-08-05 NOTE — ED Provider Notes (Signed)
[facility] Provider Note    Event Date/Time   First MD Initiated Contact with Patient 08/05/22 1855     (approximate)   History   Jaw Pain   HPI  [name] is a 77 y.o. female past medical history of prior MRI from hypertension, peripheral vascular disease who presents with jaw pain.  2 days ago patient was sitting down when she had acute onset of pain in bilateral jaw region.  Lasted for about 10 to 15 seconds and then resolved.  She had no associated symptoms including no diaphoresis chest pain or dyspnea.  Since 2 days ago she has had no ongoing symptoms no jaw pain no chest pain dyspnea diaphoresis.  Patient tells me she had a heart attack 13 years ago which started with pain in the bilateral jaw region similar to what she had earlier.  However this was also accompanied by significant chest pain.  She required CABG at that time.  She is worried that she could progress to have a heart attack again     Past Medical History:  Diagnosis Date   Abnormal Pap smear of cervix    Dr. Thomasene Mohair    Coronary artery disease    a. 06/2009 MI/PCI to LCX and LAD; b. 01/2011 Occluded stents-->CABG x 3 ([facility]): LIMA->LAD, VG->LCX, VG->RPDA; c. 01/2012 Cath: LM 50, LAD 95ost, 110m, LCX 95ost, 169m, RCA 76m, RPDA small, LIMA->LAD ok, VG->LCX ok, VG->RPDA 100-->Med rx.   Degenerative disc disease, lumbar    L4/5 noted CT ab/pelvis 05/19/11    DVT (deep venous thrombosis)    after childbirth age 45   GERD (gastroesophageal reflux disease)    Hyperlipidemia    Hypertension    under control   Macular degeneration    Myocardial infarction 2011   Parathyroid adenoma    right 13 x 8 mm s/p resection    PVD (peripheral vascular disease)    a. 01/2010 s/p Aortofemoral bypass.    Patient Active Problem List   Diagnosis Date Noted   B12 deficiency 04/08/2022   Loose stools 04/08/2022   Diarrhea 04/08/2022   Chronic neck pain 08/22/2021   Neck arthritis 08/22/2021    Chronic pain syndrome 08/22/2021   Anemia 12/16/2020   Copper deficiency myeloneuropathy    Numbness and tingling    CNS demyelination 11/09/2020   Ambulatory dysfunction 11/07/2020   Leg swelling 06/19/2020   Closed fracture of right distal humerus 12/19/2019   Preoperative clearance 10/27/2019   Elevated liver enzymes 10/27/2019   Choledocholithiasis    Weakness 05/17/2019   Closed Colles' fracture of right radius 12/30/2018   Former smoker 12/07/2018   CAD (coronary artery disease), native coronary artery 01/23/2018   Cholecystitis    Abnormal Pap smear of cervix 04/22/2017   Cervical high risk HPV (human papillomavirus) test positive 04/22/2017   DDD (degenerative disc disease), lumbar    Closed fracture of ankle, trimalleolar 10/08/2016   Abnormal Pap smear of vagina 05/05/2016   Nausea without vomiting 03/26/2016   Anxiety 01/21/2016   Abdominal distension 01/21/2016   Obesity 12/19/2015   Hypercalcemia 10/03/2015   Need for hepatitis C screening test 10/03/2015   Encounter for routine adult medical exam with abnormal findings 01/17/2015   Lipoma of arm 12/25/2014   Macular degeneration 12/25/2014   Medicare annual wellness visit, subsequent 01/04/2014   Osteoporosis 03/07/2013   Chronic back pain 03/07/2013   Weight gain 09/20/2012   Essential hypertension, benign 05/31/2012   Coronary  artery disease 05/31/2012   GERD (gastroesophageal reflux disease) 05/31/2012   Allergic rhinitis 05/31/2012   Primary hyperparathyroidism 05/31/2012   General medical exam 11/19/2011   Neuropathy 11/19/2011   PVD (peripheral vascular disease) 07/30/2010   Hyperlipidemia 03/26/2010   CAD, AUTOLOGOUS BYPASS GRAFT 03/26/2010   MULTI-VESSEL OR BILATERAL STENOSIS W/O INFARCTION 03/26/2010   TOBACCO USE, QUIT 03/26/2010     Physical Exam  Triage Vital Signs: ED Triage Vitals  Enc Vitals Group     BP 08/05/22 1549 (!) 134/56     Pulse Rate 08/05/22 1549 76     Resp 08/05/22  1549 16     Temp 08/05/22 1549 98 F (36.7 C)     Temp src --      SpO2 08/05/22 1549 93 %     Weight 08/05/22 1549 120 lb (54.4 kg)     Height 08/05/22 1549 5' (1.524 m)     Head Circumference --      Peak Flow --      Pain Score 08/05/22 1552 0     Pain Loc --      Pain Edu? --      Excl. in GC? --     Most recent vital signs: Vitals:   08/05/22 1549  BP: (!) 134/56  Pulse: 76  Resp: 16  Temp: 98 F (36.7 C)  SpO2: 93%     General: Awake, no distress.  CV:  Good peripheral perfusion.  Resp:  Normal effort.  Lung sounds are clear Abd:  No distention.  Neuro:             Awake, Alert, Oriented x 3  Other:  No trismus no cervical adenopathy no neck swelling   ED Results / Procedures / Treatments  Labs (all labs ordered are listed, but only abnormal results are displayed) Labs Reviewed  BASIC METABOLIC PANEL - Abnormal; Notable for the following components:      Result Value   Glucose, Bld 143 (*)    All other components within normal limits  CBC  TROPONIN I (HIGH SENSITIVITY)  TROPONIN I (HIGH SENSITIVITY)     EKG EKG reviewed interpreted myself shows LVH with repolarization abnormality, T wave inversions in the septal leads   RADIOLOGY I reviewed and interpreted the CXR which does not show any acute cardiopulmonary process'   PROCEDURES:  Critical Care performed: No  Procedures  The patient is on the cardiac monitor to evaluate for evidence of arrhythmia and/or significant heart rate changes.   MEDICATIONS ORDERED IN ED: Medications - No data to display   IMPRESSION / MDM / ASSESSMENT AND PLAN / ED COURSE  I reviewed the triage vital signs and the nursing notes.                              Patient's presentation is most consistent with acute presentation with potential threat to life or bodily function.  Differential diagnosis includes, but is not limited to, ACS including NSTEMI, unstable angina, TMJ,   Patient is 77 year old female  with prior history of MI who presents with jaw pain.  2 days ago patient was sitting down when she had pain that radiated into the bilateral jaws.  Did not have any associated chest pain diaphoresis dyspnea or other symptoms.  Lasted just for 10 to 15 seconds she was not exerting herself at the time.  She has had no symptoms since then but is concerned  because her prior MI started with similar jaw pain.  Patient's vitals are reassuring on exam she looks well cardiopulmonary exam is unremarkable.  EKG today shows LVH with some repolarization abnormality does have inverted T wave in V2 which is new today.  Troponin is negative.  Given patient has had no ongoing symptoms do not feel that she requires any additional workup at this time.  I did stress to her that if she develops new symptoms such as chest pain sweating shortness of breath or jaw pain that is not resolving that she return to the emergency department.       FINAL CLINICAL IMPRESSION(S) / ED DIAGNOSES   Final diagnoses:  Jaw pain     Rx / DC Orders   ED Discharge Orders     None        Note:  This document was prepared using Dragon voice recognition software and may include unintentional dictation errors.   Georga Hacking, MD 08/05/22 [phone]

## 2022-08-05 NOTE — Discharge Instructions (Signed)
Your workup today in the emergency department is reassuring.  If you have recurrent jaw pain is not resolving or you develop chest pain or any other symptoms that are concerning to you please return to the emergency department.  Otherwise please follow-up with your general cardiologist.

## 2022-08-05 NOTE — ED Provider Triage Note (Signed)
Emergency Medicine Provider Triage Evaluation Note  [name] , a 77 y.o. female  was evaluated in triage.  Pt complains of jaw pain that started earlier today. No chest pain or shortness of breath. .  Physical Exam  BP (!) 134/56   Pulse 76   Temp 98 F (36.7 C)   Resp 16   Ht 5' (1.524 m)   Wt 54.4 kg   SpO2 93%   BMI 23.44 kg/m  Gen:   Awake, no distress   Resp:  Normal effort  MSK:   Moves extremities without difficulty  Other:    Medical Decision Making  Medically screening exam initiated at 3:59 PM.  Appropriate orders placed.  [name] was informed that the remainder of the evaluation will be completed by another provider, this initial triage assessment does not replace that evaluation, and the importance of remaining in the ED until their evaluation is complete.  Cardiac workup started.    Brittany Pester, FNP 08/05/22 1601

## 2022-08-06 ENCOUNTER — Telehealth: Payer: Self-pay

## 2022-08-06 NOTE — Transitions of Care (Post Inpatient/ED Visit) (Signed)
   08/06/2022  Name: MICHELENE KENISTON MRN: [id number] DOB: 1946/02/01  Today's TOC FU Call Status: Today's TOC FU Call Status:: Unsuccessul Call (1st Attempt) Unsuccessful Call (1st Attempt) Date: 08/06/22  Attempted to reach the patient regarding the most recent Inpatient/ED visit.  Follow Up Plan: Additional outreach attempts will be made to reach the patient to complete the Transitions of Care (Post Inpatient/ED visit) call.   Signature  Donnamarie Poag, CMA

## 2022-08-07 NOTE — Progress Notes (Unsigned)
Referring Physician:  Ronita Hipps, MD [facility] [address], [address] [place],  [place] [address]  Primary Physician:  Allegra Grana, FNP  History of Present Illness: 08/11/2022 [name] has a history of MI, CAD with CABG, PVD, HTN, GERD, neuropathy with CNS demyelination, osteoporosis, hyperlipidemia, macular degeneration, DVT after childbirth at age 77.   20+ years of back pain that is intermittent. No leg pain. Pain is worse with walking and better with sitting. No numbness, tingling, or weakness in her legs. She uses rollator at home and only ambulates short distances.   Saw Dr. Sherryll Burger in neurology back in 2022 for bilateral leg weakness with T2 hyperintense signal abnormality within dorsal columns throughout cervical spinal cord and thoracic spinal cord, history of Vitamin B12 deficiency, history of copper deficiency, history of alcohol use - concerning for cervical myelopathy with extensive negative evaluation. She states nothing further was done for her neck, but doesn't really remember.   She has no neck or arm pain. She has some numbness in her hands and feet. She has no dexterity issues, but has balance issues.   She is on chronic xtampza ER from Dr. Welton Flakes.   Bowel/Bladder Dysfunction: none  Conservative measures:  Physical therapy: has not participated in the past year but she is having [facility] home health coming to her home 1 time a week.  Multimodal medical therapy including regular antiinflammatories: neurontin  Injections: has had 1 epidural steroid injection 10+ years ago and doesn't care to receive any more (had bad experience)  Past Surgery: denies  [name] has symptoms of cervical myelopathy. She has balance issues. No dexterity issues.   The symptoms are causing a significant impact on the patient's life.   Review of Systems:  A 10 point review of systems is negative, except for the pertinent positives and negatives detailed  in the HPI.  Past Medical History: Past Medical History:  Diagnosis Date   Abnormal Pap smear of cervix    Dr. Thomasene Mohair    Coronary artery disease    a. 06/2009 MI/PCI to LCX and LAD; b. 01/2011 Occluded stents-->CABG x 3 ([facility]): LIMA->LAD, VG->LCX, VG->RPDA; c. 01/2012 Cath: LM 50, LAD 95ost, 118m, LCX 95ost, 177m, RCA 26m, RPDA small, LIMA->LAD ok, VG->LCX ok, VG->RPDA 100-->Med rx.   Degenerative disc disease, lumbar    L4/5 noted CT ab/pelvis 05/19/11    DVT (deep venous thrombosis)    after childbirth age 62   GERD (gastroesophageal reflux disease)    Hyperlipidemia    Hypertension    under control   Macular degeneration    Myocardial infarction 2011   Parathyroid adenoma    right 13 x 8 mm s/p resection    PVD (peripheral vascular disease)    a. 01/2010 s/p Aortofemoral bypass.    Past Surgical History: Past Surgical History:  Procedure Laterality Date   CARDIAC CATHETERIZATION  2013   [facility]   CATARACT EXTRACTION     COLONOSCOPY     COLPOSCOPY     CORONARY ARTERY BYPASS GRAFT  02/07/2010   x 3   ERCP N/A 10/05/2019   Procedure: ENDOSCOPIC RETROGRADE CHOLANGIOPANCREATOGRAPHY (ERCP);  Surgeon: Midge Minium, MD;  [facility];  Service: Endoscopy;  Laterality: N/A;   FACIAL COSMETIC SURGERY  2013   Dr. Gertie Baron   FOOT SURGERY     right   ILIO-FEMORAL BYPASS GRAFT  2011   LIVER BIOPSY N/A 10/31/2019   Procedure: LIVER BIOPSY;  Surgeon: Henrene Dodge, MD;  Location: [facility] ORS;  Service: General;  Laterality: N/A;   MITRAL VALVE REPAIR  01/2010   ORIF ANKLE FRACTURE Right 10/08/2016   Procedure: OPEN REDUCTION INTERNAL FIXATION (ORIF) ANKLE FRACTURE;  Surgeon: Christena Flake, MD;  Location: [facility] ORS;  Service: Orthopedics;  Laterality: Right;   ORIF HUMERUS FRACTURE Right 12/20/2019   Procedure: OPEN REDUCTION INTERNAL FIXATION (ORIF) DISTAL HUMERUS FRACTURE;  Surgeon: Roby Lofts, MD;  Location: [facility] OR;  Service: Orthopedics;  Laterality: Right;    ORIF WRIST FRACTURE Right 12/29/2018   Procedure: OPEN REDUCTION INTERNAL FIXATION (ORIF) WRIST FRACTURE;  Surgeon: Christena Flake, MD;  Location: [facility] ORS;  Service: Orthopedics;  Laterality: Right;   PARATHYROIDECTOMY     TUBAL LIGATION      Allergies: Allergies as of 08/11/2022   (No Known Allergies)    Medications: Outpatient Encounter Medications as of 08/11/2022  Medication Sig   aspirin 81 MG EC tablet Take 81 mg by mouth daily.     calcium carbonate (TUMS - DOSED IN MG ELEMENTAL CALCIUM) 500 MG chewable tablet Chew 1 tablet (200 mg of elemental calcium total) by mouth 2 (two) times daily with a meal.   Cholecalciferol (VITAMIN D3) 1000 UNITS CAPS Take 1,000 Units by mouth daily.   doxylamine, Sleep, (SLEEP AID) 25 MG tablet Take 25 mg by mouth at bedtime as needed.    ezetimibe (ZETIA) 10 MG tablet TAKE 1 TABLET BY MOUTH ONCE DAILY   gabapentin (NEURONTIN) 400 MG capsule TAKE 1 CAPSULE BY MOUTH 3 TIMES DAILY   metoprolol tartrate (LOPRESSOR) 25 MG tablet TAKE 1/2 TABLET BY MOUTH TWICE DAILY.   Multiple Vitamins-Minerals (PRESERVISION AREDS 2 PO) Take 1 capsule by mouth 2 (two) times daily.   naloxone (NARCAN) nasal spray 4 mg/0.1 mL 4 or 8 mg (1 spray) intranasally every 2 to 3 minutes in alternating nostrils as needed. Seek immediate medical assistance after administration of the first dose.   pantoprazole (PROTONIX) 40 MG tablet Take 1 tablet (40 mg total) by mouth daily.   Probiotic Product (RISA-BID PROBIOTIC PO) Take 250 mg by mouth daily.    rosuvastatin (CRESTOR) 10 MG tablet Take 1 tablet (10 mg total) by mouth at bedtime.   sertraline (ZOLOFT) 50 MG tablet Take 1 tablet (50 mg total) by mouth daily.   Vibegron (GEMTESA) 75 MG TABS Take 1 tablet (75 mg total) by mouth daily.   vitamin B-12 (CYANOCOBALAMIN) 1000 MCG tablet Take 1,000 mcg by mouth daily.   XTAMPZA ER 9 MG C12A Take 1 capsule by mouth 2 (two) times daily.   [DISCONTINUED] folic acid (FOLVITE) 1 MG tablet Take  5 tablets (5 mg total) by mouth daily. (Patient not taking: Reported on 08/11/2022)   No facility-administered encounter medications on file as of 08/11/2022.    Social History: Social History   Tobacco Use   Smoking status: Former    Packs/day: 1.00    Years: 44.00    Additional pack years: 0.00    Total pack years: 44.00    Types: Cigarettes    Quit date: 07/15/2009    Years since quitting: 13.0    Passive exposure: Past   Smokeless tobacco: Never   Tobacco comments:    quit 06/2009 smoked from 20s to 2011 1.5 ppd no FH lung cancer   Vaping Use   Vaping Use: Never used  Substance Use Topics   Alcohol use: Yes    Alcohol/week: 1.0 standard drink of alcohol  Types: 1 Standard drinks or equivalent per week    Comment: occ.   Drug use: No    Family Medical History: Family History  Problem Relation Age of Onset   Heart disease Father     Physical Examination: Vitals:   08/11/22 1411  BP: 112/72    General: Patient is well developed, well nourished, calm, collected, and in no apparent distress. Attention to examination is appropriate.  Respiratory: Patient is breathing without any difficulty.   NEUROLOGICAL:     Awake, alert, oriented to person, place, and time.  Speech is clear and fluent. Fund of knowledge is appropriate.   Cranial Nerves: Pupils equal round and reactive to light.  Facial tone is symmetric.    No abnormal lesions on exposed skin.   Strength: Side Biceps Triceps Deltoid Interossei Grip Wrist Ext. Wrist Flex.  R 5 5 5 5 5 5 5   L 5 5 5 5 5 5 5    Side Iliopsoas Quads Hamstring PF DF EHL  R 5 5 5 5 5 5   L 5 5 5 5 5 5    Reflexes are 2+ and symmetric at the biceps, triceps, brachioradialis, patella and achilles.   Hoffman's is absent.  Clonus is not present.   Bilateral upper and lower extremity sensation is intact to light touch.     She is in a WC. Gait not tested.    Medical Decision Making  Imaging: Lumbar MRI dated 05/28/22:   FINDINGS: Segmentation:  Standard.   Alignment:  Grade 1 anterolisthesis of L5 on S1.   Vertebrae: No acute fracture, evidence of discitis, or aggressive bone lesion.   Conus medullaris and cauda equina: Conus extends to the T12-L1 level. Conus and cauda equina appear normal.   Paraspinal and other soft tissues: No acute paraspinal abnormality.   Disc levels:   Disc spaces: Degenerative disease with disc height loss at L3-4, L4-5 and L5-S1.   T12-L1: Mild broad-based disc bulge. No foraminal or central canal stenosis.   L1-L2: Broad-based disc bulge. No foraminal or central canal stenosis.   L2-L3: Minimal broad-based disc bulge. No foraminal or central canal stenosis.   L3-L4: Broad-based disc bulge. No foraminal or central canal stenosis.   L4-L5: Broad-based disc bulge. Moderate bilateral facet arthropathy. Mild right foraminal stenosis. No left foraminal stenosis. No spinal stenosis.   L5-S1: Broad-based disc osteophyte complex. Moderate bilateral facet arthropathy. Mild bilateral foraminal stenosis. No spinal stenosis.   IMPRESSION: 1. Lumbar spine spondylosis as described above. 2. No acute osseous injury of the lumbar spine.     Electronically Signed   By: Elige Ko M.D.   On: 05/29/2022 16:09  MRI of cervical and thoracic spine dated 11/08/20:  FINDINGS: Mild intermittent motion degradation.   MRI CERVICAL SPINE FINDINGS   Alignment: Straightening of the expected cervical lordosis. Trace C2-C3, C3-C4, C4-C5, C5-C6 and C6-C7 grade 1 retrolisthesis.   Vertebrae: Vertebral body height is maintained. Multilevel degenerative endplate irregularity and mixed degenerative endplate marrow signal. This includes mild degenerative endplate edema and enhancement at C2-C3, C3-C4, C4-C5, C5-C6 and C6-C7.   Cord: There is T2/STIR hyperintense signal abnormality within the dorsal columns throughout much of the cervical spinal cord. There is corresponding  restricted diffusion within the dorsal columns, at least at the upper cervical levels. No abnormal cord enhancement is identified. Multilevel spinal cord impingement, as described below. There is limited assessment for spinal cord signal abnormality at the C3-C4, C4-C5 and C5-C6 levels due to the degree of cord  compression.   Posterior Fossa, vertebral arteries, paraspinal tissues: Posterior fossa better assessed on brain MRI performed earlier today. Flow voids preserved within the imaged cervical vertebral arteries. Paraspinal soft tissues within normal limits.   Disc levels:   Multilevel disc degeneration. Most notably, there is moderate/advanced disc degeneration at C2-C3, C3-C4, C4-C5, C5-C6 and C6-C7.   C2-C3: Trace grade 1 retrolisthesis. Disc bulge with bilateral disc osteophyte ridge/uncinate hypertrophy. Ligamentum flavum hypertrophy. Severe spinal canal stenosis with mild spinal cord flattening. No significant foraminal stenosis.   C3-C4: Trace grade 1 retrolisthesis. Posterior disc osteophyte complex with bilateral disc osteophyte ridge/uncinate hypertrophy. Facet and ligamentum flavum hypertrophy. Severe spinal canal stenosis with significant spinal cord impingement. Bilateral neural foraminal narrowing (mild right, moderate left).   C4-C5: Trace grade 1 retrolisthesis. Posterior disc osteophyte complex with bilateral disc osteophyte ridge/uncinate hypertrophy. Facet and ligamentum flavum hypertrophy. Severe spinal canal stenosis with significant spinal cord impingement. Moderate/severe bilateral neural foraminal narrowing (greater on the left).   C5-C6: Trace grade 1 retrolisthesis. Posterior disc osteophyte complex with bilateral disc osteophyte ridge/uncinate hypertrophy. Facet arthrosis and ligamentum flavum hypertrophy. Severe spinal canal stenosis with significant spinal cord impingement. Bilateral neural foraminal narrowing (severe right, moderate/severe  left).   C6-C7: Posterior disc osteophyte complex with bilateral disc osteophyte ridge/uncinate hypertrophy. Minimal facet arthrosis and ligamentum flavum hypertrophy. Mild relative spinal canal narrowing without spinal cord mass effect. Moderate right neural foraminal narrowing.   C7-T1: Shallow disc bulge. No significant spinal canal or foraminal stenosis.   MRI THORACIC SPINE FINDINGS   Alignment: Thoracic dextrocurvature. Exaggerated thoracic kyphosis. Trace T2-T3 grade 1 anterolisthesis.   Vertebrae: Mild chronic T12 superior endplate compression deformity. Vertebral body height is otherwise maintained. Multilevel degenerative endplate irregularity and mixed degenerative endplate marrow signal. This includes mild degenerative endplate edema and enhancement at T7-T8. Fusion across the disc spaces anteriorly at T5-T6 and T6-T7.   Cord: There is T2/STIR hyperintense signal abnormality within the dorsal columns throughout much of the thoracic spinal cord. No abnormal cord enhancement is identified.   Paraspinal and other soft tissues: No abnormality identified within included portions of the thorax or upper abdomen/retroperitoneum. Paraspinal soft tissues unremarkable.   Disc levels:   Multilevel disc degeneration. Most notably, there is moderate/advanced disc degeneration at T1-T2, T4-T5, T5-T6 and T6-T7.   T1-T2: Disc bulge with endplate spurring. Minimal partial effacement of the ventral thecal sac without spinal cord mass effect. Mild right neural foraminal narrowing.   T2-T3: Trace grade 1 anterolisthesis. Disc uncovering with shallow disc bulge. Minimal partial effacement of the ventral thecal sac without spinal cord mass effect. No significant foraminal stenosis.   T3-T4: Shallow disc bulge. No significant spinal canal stenosis or neural foraminal narrowing.   T4-T5: Shallow disc bulge. Mild partial effacement of ventral thecal sac without significant spinal  cord mass effect. No significant foraminal stenosis.   T5-T6: Fusion across the ventral aspect of the disc space. Disc bulge with endplate spurring. Partial effacement of the ventral thecal sac with possible contact upon the ventral spinal cord. No significant foraminal stenosis.   T6-T7: Disc bulge with endplate spurring. The disc bulge contacts and flattens the ventral spinal cord. No significant foraminal narrowing.   T7-T8: Shallow disc bulge with endplate spurring. No significant spinal canal or foraminal stenosis.   T8-T9: Shallow disc bulge. No significant spinal canal or foraminal stenosis.   T9-T10: No significant disc herniation or stenosis.   T10-T11: No significant disc herniation or stenosis.   T11-T12: Small disc bulge. Minimal partial  effacement of the ventral thecal sac without spinal cord mass effect. No significant foraminal stenosis.   These results were called by telephone at the time of interpretation on 11/08/2020 at 4:40 pm to provider [facility] , who verbally acknowledged these results.   IMPRESSION: Cervical spine:   1. T2/STIR hyperintense signal abnormality within the dorsal columns throughout much of the cervical spinal cord. Corresponding restricted diffusion within the dorsal columns, at least at the upper cervical levels. Findings are nonspecific and potential differential considerations include B12 deficiency/subacute combined degeneration, copper deficiency, or syphilis. Cord infarction and demyelinating disease are considered less likely. 2. Cervical spondylosis, as outlined and with findings most notably as follows. 3. At C2-C3, there is multifactorial severe spinal canal stenosis with mild spinal cord flattening. 4. At C3-C4, there is multifactorial severe spinal canal stenosis with significant spinal cord impingement. Bilateral neural foraminal narrowing (mild right, moderate left) 5. At C4-C5, there is multifactorial severe  spinal canal stenosis with significant spinal cord impingement. Moderate/severe bilateral neural foraminal narrowing. 6. At C5-C6, there is multifactorial severe spinal canal stenosis with significant spinal cord impingement. Bilateral neural foraminal narrowing (severe right, moderate/severe left).   MRI thoracic spine:   1. T2/STIR hyperintense signal abnormality within the dorsal columns throughout much of the thoracic spinal cord. Findings are nonspecific and potential differential considerations include B12 deficiency/subacute combined degeneration, copper deficiency, or syphilis. Cord infarction and demyelinating disease are considered less likely. 2. Thoracic spondylosis, as outlined. 3. At T6-T7, a disc bulge and associated endplate spurring contact and mildly flatten the ventral spinal cord. 4. No more than mild relative spinal canal narrowing at the remaining levels. No compressive foraminal stenosis. 5. Fusion across the disc spaces anteriorly at T5-T6 and T6-T7     Electronically Signed   By: Jackey Loge DO   On: 11/08/2020 16:41       I have personally reviewed the images and agree with the above interpretation.  Assessment and Plan: Ms. Garritano is a pleasant 77 y.o. female has 20+ years of back pain that is intermittent. No leg pain. Pain is worse with walking and better with sitting.   She has known slip at L5-S1 with diffuse DDD and spondylosis. She has some foraminal stenosis L4-L5 and L5-S1 but has no current leg pain.   She notes balance issues for years that are getting worse. No issues with hand dexterity. No neck or arm pain.   Previous cervical MRI in 2022 showed severe central stenosis. This may be cause of her balance issues. She is unsure if this was evaluated further and I cannot tell from her chart.   Treatment options discussed with patient and following plan made:   - MRI of cervical spine to evaluate spinal stenosis seen on previous MRI and to  evaluate balance issues.  - Discussed that we may need to treat her neck before her lower back.  - She does not want to do any further injections for her lower back.  - Okay to continue with HHPT.  - Will review MRI results and likely do phone visit for follow up.   I spent a total of 35 minutes in face-to-face and non-face-to-face activities related to this patient's care today including review of outside records, review of imaging, review of symptoms, physical exam, discussion of differential diagnosis, discussion of treatment options, and documentation.   Thank you for involving me in the care of this patient.   Drake Leach PA-C Dept. of Neurosurgery

## 2022-08-07 NOTE — Transitions of Care (Post Inpatient/ED Visit) (Signed)
I spoke with pt and pt said all testing done at ED for jaw pain was reassuring. Pt said she does feel better today.I offered to schedule FU ED appt with Rennie Plowman FNP but pt declined and pt wants to FU with cardiologist to see if any other testing is needed and pt will call to schedule appt with card. UC & ED precautions given and pt voiced understanding. Sending note to Rennie Plowman FNP.     08/07/2022  Name: [name] MRN: [id number] DOB: 17-Jan-1946  Today's TOC FU Call Status: Today's TOC FU Call Status:: Successful TOC FU Call Competed Unsuccessful Call (1st Attempt) Date: 08/06/22 [facility] FU Call Complete Date: 08/07/22  Transition Care Management Follow-up Telephone Call Date of Discharge: 08/05/22 Discharge Facility: [facility]) Type of Discharge: Emergency Department Reason for ED Visit: Other: (jaw pain) How have you been since you were released from the hospital?: Better Any questions or concerns?: No  Items Reviewed: Did you receive and understand the discharge instructions provided?: Yes Medications obtained and verified?: Yes (Medications Reviewed) Any new allergies since your discharge?: No Dietary orders reviewed?: NA Do you have support at home?: Yes People in Home: other relative(s) (niece) Name of Support/Comfort Primary Source: [facility] and Equipment/Supplies: Were Home Health Services Ordered?: NA Any new equipment or medical supplies ordered?: NA  Functional Questionnaire: Do you need assistance with bathing/showering or dressing?: No Do you need assistance with meal preparation?: No Do you need assistance with eating?: No Do you have difficulty maintaining continence: No Do you need assistance with getting out of bed/getting out of a chair/moving?: No Do you have difficulty managing or taking your medications?: No  Follow up appointments reviewed: PCP Follow-up appointment confirmed?: No (pt wants to FU  with cardiologist to see if any other testing is needed and pt will call to schedule appt with card.) MD Provider Line [phone] Given: Yes Specialist Hospital Follow-up appointment confirmed?: No Reason Specialist Follow-Up Not Confirmed: Patient has Specialist Provider Number and will Call for Appointment Do you need transportation to your follow-up appointment?: No Do you understand care options if your condition(s) worsen?: Yes-patient verbalized understanding    SIGNATURE Lewanda Rife, LPN

## 2022-08-11 ENCOUNTER — Ambulatory Visit (INDEPENDENT_AMBULATORY_CARE_PROVIDER_SITE_OTHER): Payer: Medicare HMO | Admitting: Orthopedic Surgery

## 2022-08-11 ENCOUNTER — Encounter: Payer: Self-pay | Admitting: Orthopedic Surgery

## 2022-08-11 VITALS — BP 112/72 | Ht 60.0 in | Wt 120.0 lb

## 2022-08-11 DIAGNOSIS — M545 Low back pain, unspecified: Secondary | ICD-10-CM

## 2022-08-11 DIAGNOSIS — M47816 Spondylosis without myelopathy or radiculopathy, lumbar region: Secondary | ICD-10-CM | POA: Diagnosis not present

## 2022-08-11 DIAGNOSIS — M4802 Spinal stenosis, cervical region: Secondary | ICD-10-CM

## 2022-08-11 DIAGNOSIS — R2689 Other abnormalities of gait and mobility: Secondary | ICD-10-CM | POA: Diagnosis not present

## 2022-08-11 DIAGNOSIS — G8929 Other chronic pain: Secondary | ICD-10-CM

## 2022-08-11 NOTE — Patient Instructions (Signed)
It was so nice to see you today. Thank you so much for coming in.    Your have some wear and tear (arthritis) in your back and this is likely what is causing your back pain.   You had an MRI of your neck back in 2022 that showed you had pressure on the spinal cord. I am worried this is causing your balance issues.   I want to get an MRI of your neck to look into things further. We will get this approved through your insurance and [facility] will call you to schedule the appointment.   Once I have the MRI results, we will call you regarding a follow up.   Please do not hesitate to call if you have any questions or concerns. You can also message me in MyChart.   If you have not heard back about any of the MRI in the next week, please call the office so we can help you get it scheduled.   Drake Leach PA-C [phone]

## 2022-08-11 NOTE — Telephone Encounter (Signed)
noted 

## 2022-08-20 ENCOUNTER — Ambulatory Visit
Admission: RE | Admit: 2022-08-20 | Discharge: 2022-08-20 | Disposition: A | Discharge status: Home / Self Care | Payer: Medicare HMO | Source: Ambulatory Visit | Attending: Orthopedic Surgery | Admitting: Orthopedic Surgery

## 2022-08-20 DIAGNOSIS — M4802 Spinal stenosis, cervical region: Secondary | ICD-10-CM | POA: Insufficient documentation

## 2022-08-20 DIAGNOSIS — R2689 Other abnormalities of gait and mobility: Secondary | ICD-10-CM | POA: Diagnosis present

## 2022-09-01 ENCOUNTER — Encounter: Payer: Self-pay | Admitting: Orthopedic Surgery

## 2022-09-01 ENCOUNTER — Ambulatory Visit (INDEPENDENT_AMBULATORY_CARE_PROVIDER_SITE_OTHER): Payer: Medicare HMO | Admitting: Orthopedic Surgery

## 2022-09-01 DIAGNOSIS — G959 Disease of spinal cord, unspecified: Secondary | ICD-10-CM

## 2022-09-01 DIAGNOSIS — M47816 Spondylosis without myelopathy or radiculopathy, lumbar region: Secondary | ICD-10-CM | POA: Diagnosis not present

## 2022-09-01 DIAGNOSIS — R6884 Jaw pain: Secondary | ICD-10-CM

## 2022-09-01 DIAGNOSIS — R2689 Other abnormalities of gait and mobility: Secondary | ICD-10-CM

## 2022-09-01 DIAGNOSIS — M5137 Other intervertebral disc degeneration, lumbosacral region: Secondary | ICD-10-CM

## 2022-09-01 NOTE — Progress Notes (Deleted)
Telephone Visit- Progress Note: Referring Physician:  Allegra Grana, FNP [address] [place],  [place] [address]  Primary Physician:  Brittany Grana, FNP  This visit was performed via telephone.  Patient location: home Provider location: office  I spent a total of *** minutes non-face-to-face activities for this visit on the date of this encounter including review of current clinical condition and response to treatment.    Patient has given verbal consent to this telephone visits and we reviewed the limitations of a telephone visit. Patient wishes to proceed.    Chief Complaint:  ***  History of Present Illness: [name] is a 77 y.o. female has a history of MI, CAD with CABG, PVD, HTN, GERD, neuropathy with CNS demyelination, osteoporosis, hyperlipidemia, macular degeneration, DVT after childbirth at age 62.   Last seen by me on 08/11/22 for chronic back pain along with worsening balance issues. She has known slip at L5-S1 with diffuse DDD and spondylosis. She has some foraminal stenosis L4-L5 and L5-S1.   Had workup by neurology back in 2022- MRI showed severe cervical stenosis.   Phone visit done to review her updated cervical MRI.            20+ years of back pain that is intermittent. No leg pain. Pain is worse with walking and better with sitting. No numbness, tingling, or weakness in her legs. She uses rollator at home and only ambulates short distances.    Saw Dr. Sherryll [name] in neurology back in 2022 for bilateral leg weakness with T2 hyperintense signal abnormality within dorsal columns throughout cervical spinal cord and thoracic spinal cord, history of Vitamin B12 deficiency, history of copper deficiency, history of alcohol use - concerning for cervical myelopathy with extensive negative evaluation. She states nothing further was done for her neck, but doesn't really remember.    She has no neck or arm pain. She has some numbness in her hands  and feet. She has no dexterity issues, but has balance issues.    She is on chronic xtampza ER from Dr. Welton [name].    Bowel/Bladder Dysfunction: none   Conservative measures:  Physical therapy: has not participated in the past year but she is having [facility] home health coming to her home 1 time a week.  Multimodal medical therapy including regular antiinflammatories: neurontin  Injections: has had 1 epidural steroid injection 10+ years ago and doesn't care to receive any more (had bad experience)   Past Surgery: denies   [name] has symptoms of cervical myelopathy. She has balance issues. No dexterity issues.    The symptoms are causing a significant impact on the patient's life. .     Exam: No exam done as this was a telephone encounter.     Imaging: ***  I have personally reviewed the images and agree with the above interpretation.  Assessment and Plan: [name] is a pleasant 77 y.o. female with ***  Treatment options discussed with patient and following plan made:   - Order for physical therapy for *** spine ***. Patient to call to schedule appointment. *** - Continue current medications including ***. Reviewed dosing and side effects.  - Prescription for ***. Reviewed dosing and side effects. Take with food.  - Prescription for *** to take prn muscle spasms. Reviewed dosing and side effects. Discussed this can cause drowsiness.  - MRI of *** to further evaluate *** radiculopathy. No improvement time or medications (***).  - Referral to PMR  at [facility] to discuss possible *** injections.  - Will schedule phone visit to review MRI results once I get them back.   Brittany Leach PA-C Neurosurgery

## 2022-09-01 NOTE — Progress Notes (Signed)
Telephone Visit- Progress Note: Referring Physician:  No referring provider defined for this encounter.  Primary Physician:  Allegra Grana, FNP  This visit was performed via telephone.  Patient location: home Provider location: office  I spent a total of 15 minutes non-face-to-face activities for this visit on the date of this encounter including review of current clinical condition and response to treatment.    Patient has given verbal consent to this telephone visits and we reviewed the limitations of a telephone visit. Patient wishes to proceed.    Chief Complaint:  review cervical MRI  History of Present Illness: [name] is a 77 y.o. female has a history of MI, CAD with CABG, PVD, HTN, GERD, neuropathy with CNS demyelination, osteoporosis, hyperlipidemia, macular degeneration, DVT after childbirth at age 42.   Last seen by me on 08/11/22 for chronic back pain along with worsening balance issues. She has known slip at L5-S1 with diffuse DDD and spondylosis. She has some foraminal stenosis L4-L5 and L5-S1.   Had workup by neurology back in 2022- MRI showed severe cervical stenosis.   Phone visit done to review her updated cervical MRI.  She continues with balance issues that gotten worse in last year. No dexterity issues. She has numbness in her hands and feet. No neck or arm pain.    She is on chronic xtampza ER from Dr. Welton Flakes.    Conservative measures:  Physical therapy: has not participated in the past year but she is having [facility] home health coming to her home 1 time a week.  Multimodal medical therapy including regular antiinflammatories: neurontin  Injections: has had 1 epidural steroid injection 10+ years ago and doesn't care to receive any more (had bad experience)   Past Surgery: denies   [name] has symptoms of cervical myelopathy. She has balance issues. No dexterity issues.    The symptoms are causing a significant impact on the patient's  life. .    Exam: No exam done as this was a telephone encounter.     Imaging: MRI of cervical spine dated 08/20/22:  FINDINGS: Alignment: Physiologic.   Vertebrae: No acute fracture, evidence of discitis, or aggressive bone lesion.   Cord: Focal T2 hyperintense signal along the dorsal aspect of the cervical spinal cord from C5 through T1 as can be seen with B12 deficiency or cord degeneration.   Posterior Fossa, vertebral arteries, paraspinal tissues: Posterior fossa demonstrates no focal abnormality. Vertebral artery flow voids are maintained. Paraspinal soft tissues are unremarkable.   Disc levels:   Discs: Degenerative disease with disc height loss at C2-3, C3-4, C4-5, C5-6 and C6-7. Degenerative disease with disc height loss at T1-2 and T2-3.   C2-3: Mild broad-based disc bulge. Mild right foraminal stenosis. No left foraminal stenosis. Mild bilateral facet arthropathy. No spinal stenosis.   C3-4: Broad-based disc bulge. Bilateral uncovertebral degenerative changes. Mild bilateral facet arthropathy. Severe left and moderate right foraminal stenosis. Severe spinal stenosis with compression of the cervical spinal cord.   C4-5: Broad-based disc bulge. Bilateral uncovertebral degenerative changes. Severe bilateral foraminal stenosis. Severe spinal stenosis with compression of the cervical spinal cord.   C5-6: Mild broad-based disc bulge. Bilateral uncovertebral degenerative changes. Moderate left and severe right foraminal stenosis. Moderate spinal stenosis.   C6-7: Minimal broad-based disc bulge. Bilateral uncovertebral degenerative changes. Moderate bilateral foraminal stenosis. No spinal stenosis.   C7-T1: No significant disc bulge. No neural foraminal stenosis. No central canal stenosis.   IMPRESSION: 1. At C3-4 there  is a broad-based disc bulge. Bilateral uncovertebral degenerative changes. Mild bilateral facet arthropathy. Severe left and moderate right  foraminal stenosis. Severe spinal stenosis with compression of the cervical spinal cord. 2. At C4-5 there is a broad-based disc bulge. Bilateral uncovertebral degenerative changes. Severe bilateral foraminal stenosis. Severe spinal stenosis with compression of the cervical spinal cord. 3. At C5-6 there is a mild broad-based disc bulge. Bilateral uncovertebral degenerative changes. Moderate left and severe right foraminal stenosis. Moderate spinal stenosis. 4. At C6-7 there is a minimal broad-based disc bulge. Bilateral uncovertebral degenerative changes. Moderate bilateral foraminal stenosis. 5. Stable, focal T2 hyperintense signal along the dorsal aspect of the cervical spinal cord from C5 through T1 as can be seen with B12 deficiency or cord degeneration.     Electronically Signed   By: Elige Ko M.D.   On: 08/25/2022 14:05  I have personally reviewed the images and agree with the above interpretation.  Assessment and Plan: [name] is a pleasant 77 y.o. female with balance issues for years that are getting worse. No issues with hand dexterity. No neck or arm pain.   She has severe central stenosis at C3-C4 and C4-C5. She has moderate central stenosis C5-C6. Note also made of focal T2 hyperintense signal along the dorsal aspect of the cervical spinal cord from C5 through T1 as can be seen with B12 deficiency or cord degeneration.  LBP was her primary complaint and she has known slip at L5-S1 with diffuse DDD and spondylosis. She has some foraminal stenosis L4-L5 and L5-S1 as well.   Treatment options discussed with patient and following plan made:   - Discussed cervical myelopathy at length. Even though her LBP is her primary complaint, we discussed her cervical spinal stenosis needed to be dealt with first.  - Follow up made with Dr. Myer Haff on 09/15/22 to discuss further options.   Drake Leach PA-C Neurosurgery

## 2022-09-02 ENCOUNTER — Telehealth: Payer: Self-pay | Admitting: Orthopedic Surgery

## 2022-09-02 NOTE — Telephone Encounter (Signed)
Addendum made to note from yesterday adding this information.

## 2022-09-02 NOTE — Telephone Encounter (Signed)
Pt called in today to report: 2-4 weeks ago had jaw pain that radiated to her neck  Wanted to have this noted based on the conversation during her telephone visit yesterday 05/14

## 2022-09-03 ENCOUNTER — Telehealth: Payer: Medicare HMO | Admitting: Orthopedic Surgery

## 2022-09-04 ENCOUNTER — Telehealth: Payer: Self-pay | Admitting: Orthopedic Surgery

## 2022-09-04 NOTE — Telephone Encounter (Signed)
Spoke with patient. Advised if she is concerned about her jaw pain being cardiac in nature that she should call her cardiologist Mariah Milling). She is not currently having jaw pain and hasn't had it since her ED visit on 08/05/22.   Discussed that Dr. Myer Haff would discuss any necessary clearances at her appointment.   She had no further questions.

## 2022-09-04 NOTE — Telephone Encounter (Signed)
See phone call from today

## 2022-09-04 NOTE — Telephone Encounter (Signed)
Pt has called again in reference to her jaw pain. She is fearful that she will not be cleared for surgery if it happens again. Pt also states that even though the pain only lasted a couple seconds is was scary for her.  I did inform the pt that Kennyth Arnold had added the information to her last visit note and that all of the information would be accessible by Dr. Jeannie Fend prior to her visit with him on 5/28. Let her know that he would be reviewing all the information including the added info about her jaw pain before he met with her.  Spoke with Kennyth Arnold to ask her to speak with the patient and possibly ease her Zelada.

## 2022-09-07 ENCOUNTER — Other Ambulatory Visit: Payer: Self-pay | Admitting: Family

## 2022-09-07 DIAGNOSIS — I10 Essential (primary) hypertension: Secondary | ICD-10-CM

## 2022-09-11 NOTE — Progress Notes (Unsigned)
Referring Physician:  Allegra Grana, FNP [address] [place],  [place] [address]  Primary Physician:  Allegra Grana, FNP  History of Present Illness: 09/15/2022 [name] is here today with a chief complaint of balance issues that been ongoing for quite some time.  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  She also has severe cervical stenosis.  She is able to walk with a walker, but has gotten worse over the past year or so.  She also has numbness in her hands and feet.  She sometimes struggles with dexterity in her hands as well as opening jars.  Bowel/Bladder Dysfunction: none  Conservative measures:  Physical therapy: currently participating in [facility] PT through [facility]  Multimodal medical therapy including regular antiinflammatories:  gabapentin, xtampza   Injections: has received 1 epidural steroid injections 10+ years ago and doesn't care to have any more.   Past Surgery: denies  [name] has symptoms of cervical myelopathy.  Progress Note from Drake Leach, [place] on 09/01/22:   History of Present Illness: [name] is a 77 y.o. female has a history of MI, CAD with CABG, PVD, HTN, GERD, neuropathy with CNS demyelination, osteoporosis, hyperlipidemia, macular degeneration, DVT after childbirth at age 47.    Last seen by me on 08/11/22 for chronic back pain along with worsening balance issues. She has known slip at L5-S1 with diffuse DDD and spondylosis. She has some foraminal stenosis L4-L5 and L5-S1.    Had workup by neurology back in 2022- MRI showed severe cervical stenosis.    Phone visit done to review her updated cervical MRI.   She continues with balance issues that gotten worse in last year. No dexterity issues. She has numbness in her hands and feet. No neck or arm pain.    She is on chronic xtampza ER from Dr. Welton Flakes.  The symptoms are causing a significant impact on the patient's life.   I have  utilized the care everywhere function in epic to review the outside records available from external health systems.  Review of Systems:  A 10 point review of systems is negative, except for the pertinent positives and negatives detailed in the HPI.  Past Medical History: Past Medical History:  Diagnosis Date   Abnormal Pap smear of cervix    Dr. Thomasene Mohair    Coronary artery disease    a. 06/2009 MI/PCI to LCX and LAD; b. 01/2011 Occluded stents-->CABG x 3 ([facility]): LIMA->LAD, VG->LCX, VG->RPDA; c. 01/2012 Cath: LM 50, LAD 95ost, 177m, LCX 95ost, 135m, RCA 23m, RPDA small, LIMA->LAD ok, VG->LCX ok, VG->RPDA 100-->Med rx.   Degenerative disc disease, lumbar    L4/5 noted CT ab/pelvis 05/19/11    DVT (deep venous thrombosis) (HCC)    after childbirth age 13   GERD (gastroesophageal reflux disease)    Hyperlipidemia    Hypertension    under control   Macular degeneration    Myocardial infarction [facility]) 2011   Parathyroid adenoma    right 13 x 8 mm s/p resection    PVD (peripheral vascular disease) (HCC)    a. 01/2010 s/p Aortofemoral bypass.    Past Surgical History: Past Surgical History:  Procedure Laterality Date   CARDIAC CATHETERIZATION  2013   [facility]   CATARACT EXTRACTION     COLONOSCOPY     COLPOSCOPY     CORONARY ARTERY BYPASS GRAFT  02/07/2010   x 3   ERCP N/A  10/05/2019   Procedure: ENDOSCOPIC RETROGRADE CHOLANGIOPANCREATOGRAPHY (ERCP);  Surgeon: Midge Minium, MD;  Location: [facility] ENDOSCOPY;  Service: Endoscopy;  Laterality: N/A;   FACIAL COSMETIC SURGERY  2013   Dr. Gertie Baron   FOOT SURGERY     right   ILIO-FEMORAL BYPASS GRAFT  2011   LIVER BIOPSY N/A 10/31/2019   Procedure: LIVER BIOPSY;  Surgeon: Henrene Dodge, MD;  Location: [facility] ORS;  Service: General;  Laterality: N/A;   MITRAL VALVE REPAIR  01/2010   ORIF ANKLE FRACTURE Right 10/08/2016   Procedure: OPEN REDUCTION INTERNAL FIXATION (ORIF) ANKLE FRACTURE;  Surgeon: Christena Flake, MD;  Location: [facility] ORS;   Service: Orthopedics;  Laterality: Right;   ORIF HUMERUS FRACTURE Right 12/20/2019   Procedure: OPEN REDUCTION INTERNAL FIXATION (ORIF) DISTAL HUMERUS FRACTURE;  Surgeon: Roby Lofts, MD;  Location: [facility] OR;  Service: Orthopedics;  Laterality: Right;   ORIF WRIST FRACTURE Right 12/29/2018   Procedure: OPEN REDUCTION INTERNAL FIXATION (ORIF) WRIST FRACTURE;  Surgeon: Christena Flake, MD;  Location: [facility] ORS;  Service: Orthopedics;  Laterality: Right;   PARATHYROIDECTOMY     TUBAL LIGATION      Allergies: Allergies as of 09/15/2022   (No Known Allergies)    Medications:  Current Outpatient Medications:    aspirin 81 MG EC tablet, Take 81 mg by mouth daily.  , Disp: , Rfl:    calcium carbonate (TUMS - DOSED IN MG ELEMENTAL CALCIUM) 500 MG chewable tablet, Chew 1 tablet (200 mg of elemental calcium total) by mouth 2 (two) times daily with a meal., Disp: , Rfl:    Cholecalciferol (VITAMIN D3) 1000 UNITS CAPS, Take 1,000 Units by mouth daily., Disp: , Rfl:    doxylamine, Sleep, (SLEEP AID) 25 MG tablet, Take 25 mg by mouth at bedtime as needed. , Disp: , Rfl:    ezetimibe (ZETIA) 10 MG tablet, TAKE 1 TABLET BY MOUTH ONCE DAILY, Disp: 90 tablet, Rfl: 0   gabapentin (NEURONTIN) 400 MG capsule, TAKE 1 CAPSULE BY MOUTH 3 TIMES DAILY, Disp: 90 capsule, Rfl: 1   metoprolol tartrate (LOPRESSOR) 25 MG tablet, TAKE 1/2 TABLET BY MOUTH TWICE DAILY., Disp: 60 tablet, Rfl: 0   Multiple Vitamins-Minerals (PRESERVISION AREDS 2 PO), Take 1 capsule by mouth 2 (two) times daily., Disp: , Rfl:    naloxone (NARCAN) nasal spray 4 mg/0.1 mL, 4 or 8 mg (1 spray) intranasally every 2 to 3 minutes in alternating nostrils as needed. Seek immediate medical assistance after administration of the first dose., Disp: 2 each, Rfl: 2   pantoprazole (PROTONIX) 40 MG tablet, Take 1 tablet (40 mg total) by mouth daily., Disp: 90 tablet, Rfl: 4   Probiotic Product (RISA-BID PROBIOTIC PO), Take 250 mg by mouth daily. , Disp: , Rfl:     rosuvastatin (CRESTOR) 10 MG tablet, TAKE 1 TABLET BY MOUTH AT BEDTIME, Disp: 90 tablet, Rfl: 3   sertraline (ZOLOFT) 50 MG tablet, Take 1 tablet (50 mg total) by mouth daily., Disp: 90 tablet, Rfl: 3   Vibegron (GEMTESA) 75 MG TABS, Take 1 tablet (75 mg total) by mouth daily., Disp: 30 tablet, Rfl: 11   vitamin B-12 (CYANOCOBALAMIN) 1000 MCG tablet, Take 1,000 mcg by mouth daily., Disp: , Rfl:    XTAMPZA ER 9 MG C12A, Take 1 capsule by mouth 2 (two) times daily., Disp: , Rfl:   Social History: Social History   Tobacco Use   Smoking status: Former    Packs/day: 1.00    Years: 44.00  Additional pack years: 0.00    Total pack years: 44.00    Types: Cigarettes    Quit date: 07/15/2009    Years since quitting: 13.1    Passive exposure: Past   Smokeless tobacco: Never   Tobacco comments:    quit 06/2009 smoked from 20s to 2011 1.5 ppd no FH lung cancer   Vaping Use   Vaping Use: Never used  Substance Use Topics   Alcohol use: Yes    Alcohol/week: 1.0 standard drink of alcohol    Types: 1 Standard drinks or equivalent per week    Comment: occ.   Drug use: No    Family Medical History: Family History  Problem Relation Age of Onset   Heart disease Father     Physical Examination: Vitals:   09/15/22 1505  BP: (!) 108/58    General: Patient is in no apparent distress. Attention to examination is appropriate.  Neck:   Supple.  Full range of motion.  Respiratory: Patient is breathing without any difficulty.   NEUROLOGICAL:     Awake, alert, oriented to person, place, and time.  Speech is clear and fluent.   Cranial Nerves: Pupils equal round and reactive to light.  Facial tone is symmetric.  Facial sensation is symmetric. Shoulder shrug is symmetric. Tongue protrusion is midline.  There is no pronator drift.  Strength: Side Biceps Triceps Deltoid Interossei Grip Wrist Ext. Wrist Flex.  R 5 5 5 4 4 5 5   L 5 5 5 4 4 5 5    Side Iliopsoas Quads Hamstring PF DF EHL  R 5  5 5 5 5 5   L 5 5 5 5 5 5    Reflexes are 1+ and symmetric at the biceps, triceps, brachioradialis, patella and achilles.   Hoffman's is absent.   Bilateral upper and lower extremity sensation is symmetric to light touch.    No evidence of dysmetria noted.  Gait is abnormal - she cannot walk due to imbalance.  She cannot perform tandem walk.     Medical Decision Making  Imaging: MRI C spine 08/25/2022 IMPRESSION: 1. At C3-4 there is a broad-based disc bulge. Bilateral uncovertebral degenerative changes. Mild bilateral facet arthropathy. Severe left and moderate right foraminal stenosis. Severe spinal stenosis with compression of the cervical spinal cord. 2. At C4-5 there is a broad-based disc bulge. Bilateral uncovertebral degenerative changes. Severe bilateral foraminal stenosis. Severe spinal stenosis with compression of the cervical spinal cord. 3. At C5-6 there is a mild broad-based disc bulge. Bilateral uncovertebral degenerative changes. Moderate left and severe right foraminal stenosis. Moderate spinal stenosis. 4. At C6-7 there is a minimal broad-based disc bulge. Bilateral uncovertebral degenerative changes. Moderate bilateral foraminal stenosis. 5. Stable, focal T2 hyperintense signal along the dorsal aspect of the cervical spinal cord from C5 through T1 as can be seen with B12 deficiency or cord degeneration.     Electronically Signed   By: Elige Ko M.D.   On: 08/25/2022 14:05  MRI L spine 05/28/2022 Disc levels:   Disc spaces: Degenerative disease with disc height loss at L3-4, L4-5 and L5-S1.   T12-L1: Mild broad-based disc bulge. No foraminal or central canal stenosis.   L1-L2: Broad-based disc bulge. No foraminal or central canal stenosis.   L2-L3: Minimal broad-based disc bulge. No foraminal or central canal stenosis.   L3-L4: Broad-based disc bulge. No foraminal or central canal stenosis.   L4-L5: Broad-based disc bulge. Moderate bilateral facet  arthropathy. Mild right foraminal stenosis. No left foraminal  stenosis. No spinal stenosis.   L5-S1: Broad-based disc osteophyte complex. Moderate bilateral facet arthropathy. Mild bilateral foraminal stenosis. No spinal stenosis.   IMPRESSION: 1. Lumbar spine spondylosis as described above. 2. No acute osseous injury of the lumbar spine.     Electronically Signed   By: Elige Ko M.D.   On: 05/29/2022 16:09  I have personally reviewed the images and agree with the above interpretation.  Assessment and Plan: [name] is a pleasant 77 y.o. female with cervical stenosis with some symptoms of cervical myelopathy.  Her condition is clouded by her underlying diagnosis of subacute combined degeneration.  Given her severe stenosis at C3-4, C4-5, and C5-6, decompression to try to improve her cervical myelopathy is reasonable.  I did have a long discussion with her that I cannot necessarily guarantee that she would improve.  There is no conservative management for cervical myelopathy.  Thus, 1 could consider C3-6 anterior cervical discectomy and fusion.  She would like to consider this and will let us know if she would like to move forward.  If she does move forward, she will require cardiology clearance prior to surgery.  I discussed the planned procedure at length with the patient, including the risks, benefits, alternatives, and indications. The risks discussed include but are not limited to bleeding, infection, need for reoperation, spinal fluid leak, stroke, vision loss, anesthetic complication, coma, paralysis, and even death. We also discussed the possibility of post-operative dysphagia, vocal cord paralysis, and the risk of adjacent segment disease in the future. I also described in detail that improvement was not guaranteed.  The patient expressed understanding of these risks, and asked that we proceed with surgery. I described the surgery in layman's terms, and gave ample opportunity  for questions, which were answered to the best of my ability.  I spent a total of 30 minutes in this patient's care today. This time was spent reviewing pertinent records including imaging studies, obtaining and confirming history, performing a directed evaluation, formulating and discussing my recommendations, and documenting the visit within the medical record.      Thank you for involving me in the care of this patient.      Tatjana Turcott K. Myer Haff MD, [facility] Neurosurgery

## 2022-09-15 ENCOUNTER — Ambulatory Visit (INDEPENDENT_AMBULATORY_CARE_PROVIDER_SITE_OTHER): Payer: Medicare HMO | Admitting: Neurosurgery

## 2022-09-15 ENCOUNTER — Encounter: Payer: Self-pay | Admitting: Neurosurgery

## 2022-09-15 ENCOUNTER — Ambulatory Visit: Payer: Medicare HMO | Admitting: Orthopedic Surgery

## 2022-09-15 VITALS — BP 108/58 | Ht 60.0 in | Wt 118.0 lb

## 2022-09-15 DIAGNOSIS — G959 Disease of spinal cord, unspecified: Secondary | ICD-10-CM

## 2022-09-15 DIAGNOSIS — G320 Subacute combined degeneration of spinal cord in diseases classified elsewhere: Secondary | ICD-10-CM | POA: Diagnosis not present

## 2022-09-15 DIAGNOSIS — M4802 Spinal stenosis, cervical region: Secondary | ICD-10-CM

## 2022-09-29 ENCOUNTER — Encounter (INDEPENDENT_AMBULATORY_CARE_PROVIDER_SITE_OTHER): Payer: Medicare HMO | Admitting: Ophthalmology

## 2022-10-07 ENCOUNTER — Encounter (INDEPENDENT_AMBULATORY_CARE_PROVIDER_SITE_OTHER): Payer: Medicare HMO | Admitting: Ophthalmology

## 2022-10-07 ENCOUNTER — Telehealth: Payer: Self-pay | Admitting: Family

## 2022-10-07 DIAGNOSIS — H35033 Hypertensive retinopathy, bilateral: Secondary | ICD-10-CM

## 2022-10-07 DIAGNOSIS — H43813 Vitreous degeneration, bilateral: Secondary | ICD-10-CM | POA: Diagnosis not present

## 2022-10-07 DIAGNOSIS — I10 Essential (primary) hypertension: Secondary | ICD-10-CM | POA: Diagnosis not present

## 2022-10-07 DIAGNOSIS — H353231 Exudative age-related macular degeneration, bilateral, with active choroidal neovascularization: Secondary | ICD-10-CM

## 2022-10-07 NOTE — Telephone Encounter (Signed)
Copied from CRM [id number]. Topic: Medicare AWV >> Oct 07, 2022 10:49 AM Payton Doughty wrote: Reason for CRM: LM 10/07/2022 to schedule AWV   Verlee Rossetti; Care Guide Ambulatory Clinical Support [facility] Direct Dial: [phone]

## 2022-10-12 ENCOUNTER — Encounter: Payer: Self-pay | Admitting: Family

## 2022-10-12 ENCOUNTER — Ambulatory Visit (INDEPENDENT_AMBULATORY_CARE_PROVIDER_SITE_OTHER): Payer: Medicare HMO | Admitting: Family

## 2022-10-12 VITALS — BP 118/72 | HR 74 | Temp 98.0°F | Resp 16 | Ht 60.0 in | Wt 118.0 lb

## 2022-10-12 DIAGNOSIS — I10 Essential (primary) hypertension: Secondary | ICD-10-CM

## 2022-10-12 DIAGNOSIS — I25118 Atherosclerotic heart disease of native coronary artery with other forms of angina pectoris: Secondary | ICD-10-CM | POA: Diagnosis not present

## 2022-10-12 DIAGNOSIS — E538 Deficiency of other specified B group vitamins: Secondary | ICD-10-CM

## 2022-10-12 DIAGNOSIS — E2839 Other primary ovarian failure: Secondary | ICD-10-CM

## 2022-10-12 DIAGNOSIS — Z780 Asymptomatic menopausal state: Secondary | ICD-10-CM | POA: Diagnosis not present

## 2022-10-12 DIAGNOSIS — F419 Anxiety disorder, unspecified: Secondary | ICD-10-CM

## 2022-10-12 DIAGNOSIS — M5136 Other intervertebral disc degeneration, lumbar region: Secondary | ICD-10-CM

## 2022-10-12 MED ORDER — SERTRALINE HCL 50 MG PO TABS
50.0000 mg | ORAL_TABLET | Freq: Every day | ORAL | 3 refills | Status: AC
Start: 2022-10-12 — End: ?

## 2022-10-12 NOTE — Progress Notes (Unsigned)
Assessment & Plan:  There are no diagnoses linked to this encounter.   Return precautions given.   Risks, benefits, and alternatives of the medications and treatment plan prescribed today were discussed, and patient expressed understanding.   Education regarding symptom management and diagnosis given to patient on AVS either electronically or printed.  No follow-ups on file.  Rennie Plowman, FNP  Subjective:    Patient ID: [name], female    DOB: 1945-09-10, 77 y.o.   MRN: [id number]  CC: [name] is a 77 y.o. female who presents today for follow up.   HPI: She uses rollator and wheelchair at home She does PT once weekly in home.      History of coronary artery disease, sp CABG x 3   She is compliant with 81 mg ASA She quit smoking 2011 [place] lung cancer screening program   She follows with Dr Welton Flakes , pain management Consult Dr. Myer Haff 09/15/2022 for cervical stenosis.  Discussed decompression.   Allergies: Patient has no known allergies. Current Outpatient Medications on File Prior to Visit  Medication Sig Dispense Refill   aspirin 81 MG EC tablet Take 81 mg by mouth daily.       calcium carbonate (TUMS - DOSED IN MG ELEMENTAL CALCIUM) 500 MG chewable tablet Chew 1 tablet (200 mg of elemental calcium total) by mouth 2 (two) times daily with a meal.     Cholecalciferol (VITAMIN D3) 1000 UNITS CAPS Take 1,000 Units by mouth daily.     doxylamine, Sleep, (SLEEP AID) 25 MG tablet Take 25 mg by mouth at bedtime as needed.      ezetimibe (ZETIA) 10 MG tablet TAKE 1 TABLET BY MOUTH ONCE DAILY 90 tablet 0   gabapentin (NEURONTIN) 400 MG capsule TAKE 1 CAPSULE BY MOUTH 3 TIMES DAILY 90 capsule 1   metoprolol tartrate (LOPRESSOR) 25 MG tablet TAKE 1/2 TABLET BY MOUTH TWICE DAILY. 60 tablet 0   naloxone (NARCAN) nasal spray 4 mg/0.1 mL 4 or 8 mg (1 spray) intranasally every 2 to 3 minutes in alternating nostrils as needed. Seek immediate medical assistance after  administration of the first dose. 2 each 2   pantoprazole (PROTONIX) 40 MG tablet Take 1 tablet (40 mg total) by mouth daily. 90 tablet 4   Probiotic Product (RISA-BID PROBIOTIC PO) Take 250 mg by mouth daily.      rosuvastatin (CRESTOR) 10 MG tablet TAKE 1 TABLET BY MOUTH AT BEDTIME 90 tablet 3   sertraline (ZOLOFT) 50 MG tablet Take 1 tablet (50 mg total) by mouth daily. 90 tablet 3   Vibegron (GEMTESA) 75 MG TABS Take 1 tablet (75 mg total) by mouth daily. 30 tablet 11   vitamin B-12 (CYANOCOBALAMIN) 1000 MCG tablet Take 1,000 mcg by mouth daily.     XTAMPZA ER 9 MG C12A Take 1 capsule by mouth 2 (two) times daily.     No current facility-administered medications on file prior to visit.    Review of Systems    Objective:    BP 118/72   Pulse 74   Temp 98 F (36.7 C)   Resp 16   Ht 5' (1.524 m)   Wt 118 lb (53.5 kg)   SpO2 98%   BMI 23.05 kg/m  BP Readings from Last 3 Encounters:  10/12/22 118/72  09/15/22 (!) 108/58  08/11/22 112/72   Wt Readings from Last 3 Encounters:  10/12/22 118 lb (53.5 kg)  09/15/22 118 lb (53.5 kg)  08/11/22 120 lb (54.4 kg)    Physical Exam

## 2022-10-12 NOTE — Patient Instructions (Addendum)
Please call [facility] and schedule mammogram and bone density  [phone]   Nice too see you!

## 2022-10-13 LAB — COMPREHENSIVE METABOLIC PANEL
ALT: 25 U/L (ref 0–35)
AST: 41 U/L — ABNORMAL HIGH (ref 0–37)
Albumin: 4.2 g/dL (ref 3.5–5.2)
Alkaline Phosphatase: 63 U/L (ref 39–117)
BUN: 8 mg/dL (ref 6–23)
CO2: 24 mEq/L (ref 19–32)
Calcium: 10.1 mg/dL (ref 8.4–10.5)
Chloride: 102 mEq/L (ref 96–112)
Creatinine, Ser: 1.04 mg/dL (ref 0.40–1.20)
GFR: 52.87 mL/min — ABNORMAL LOW (ref >60.00)
Glucose, Bld: 195 mg/dL — ABNORMAL HIGH (ref 70–99)
Potassium: 3.7 mEq/L (ref 3.5–5.1)
Sodium: 137 mEq/L (ref 135–145)
Total Bilirubin: 0.5 mg/dL (ref 0.2–1.2)
Total Protein: 6.9 g/dL (ref 6.0–8.3)

## 2022-10-13 LAB — TSH: TSH: 4.32 u[IU]/mL (ref 0.35–5.50)

## 2022-10-13 LAB — LIPID PANEL
Cholesterol: 169 mg/dL (ref 0–200)
HDL: 85.7 mg/dL (ref >39.00)
LDL Cholesterol: 69 mg/dL (ref 0–99)
NonHDL: 83.08
Total CHOL/HDL Ratio: 2
Triglycerides: 69 mg/dL (ref 0.0–149.0)
VLDL: 13.8 mg/dL (ref 0.0–40.0)

## 2022-10-13 LAB — B12 AND FOLATE PANEL
Folate: 20 ng/mL (ref >5.9)
Vitamin B-12: 624 pg/mL (ref 211–911)

## 2022-10-14 NOTE — Assessment & Plan Note (Signed)
Chronic with poor control.  She continues to follows with Dr Welton Flakes , pain management, with recent consult with neurosurgery, Dr Myer Haff.  She is still considering surgical options.  Will follow

## 2022-10-14 NOTE — Assessment & Plan Note (Signed)
Chronic, stable.  Continue Zoloft 50 mg. ?

## 2022-10-16 LAB — INTRINSIC FACTOR ANTIBODIES: Intrinsic Factor: NEGATIVE

## 2022-10-23 ENCOUNTER — Telehealth: Payer: Self-pay

## 2022-10-23 DIAGNOSIS — R748 Abnormal levels of other serum enzymes: Secondary | ICD-10-CM

## 2022-10-23 NOTE — Telephone Encounter (Signed)
Labs ordered for lab appt.  

## 2022-11-07 ENCOUNTER — Other Ambulatory Visit: Payer: Self-pay | Admitting: Family

## 2022-11-07 DIAGNOSIS — I10 Essential (primary) hypertension: Secondary | ICD-10-CM

## 2022-11-07 DIAGNOSIS — K219 Gastro-esophageal reflux disease without esophagitis: Secondary | ICD-10-CM

## 2022-11-09 ENCOUNTER — Other Ambulatory Visit: Payer: Self-pay | Admitting: Family

## 2022-12-07 ENCOUNTER — Telehealth: Payer: Self-pay | Admitting: *Deleted

## 2022-12-07 NOTE — Telephone Encounter (Signed)
Telephone encounter was:  Successful.  12/07/2022 Name: KEYLIANIS TAKARA MRN: [id number] DOB: 23-Dec-1945  [name] is a 77 y.o. year old female who is a primary care patient of Allegra Grana, FNP . The community resource team was consulted for assistance with Transportation Needs  called patient to schedule her transportation for cpncierge line request will call abck when its assigned  Care guide performed the following interventions: Patient provided with information about care guide support team and interviewed to confirm resource needs.called patient to schedule her transportation for cpncierge line request will call abck when its assigned  and also agreed to waiver   Follow Up Plan:  Care guide will follow up with patient by phone over the next day  Sherrie Marsan Greenauer -[facility], Population Health [phone] [address] [place] , [place] [address] Email : [email or web address] Kitchin Figler DOB: 02/28/1946 MRN: [id number]   RIDER WAIVER AND RELEASE OF LIABILITY  For purposes of improving physical access to our facilities, [facility] is pleased to partner with third parties to provide [facility] patients or other authorized individuals the option of convenient, on-demand ground transportation services (the [employer]") through use of the technology service that enables users to request on-demand ground transportation from independent third-party providers.  By opting to use and accept these [employer], I, the undersigned, hereby agree on behalf of myself, and on behalf of any minor child using the Science writer for whom I am the parent or legal guardian, as follows:  Science writer provided to me are provided by independent third-party transportation providers who are not [employer] or employees and who are unaffiliated with [employer]. [place] is neither a transportation carrier nor a common or public  carrier. [place] has no control over the quality or safety of the transportation that occurs as a result of the [employer]. [place] cannot guarantee that any third-party transportation provider will complete any arranged transportation service. [place] makes no representation, warranty, or guarantee regarding the reliability, timeliness, quality, safety, suitability, or availability of any of the Transport Services or that they will be error free. I fully understand that traveling by vehicle involves risks and dangers of serious bodily injury, including permanent disability, paralysis, and death. I agree, on behalf of myself and on behalf of any minor child using the Transport Services for whom I am the parent or legal guardian, that the entire risk arising out of my use of the [employer] remains solely with me, to the maximum extent permitted under applicable law. The [employer] are provided "as is" and "as available." [place] disclaims all representations and warranties, express, implied or statutory, not expressly set out in these terms, including the implied warranties of merchantability and fitness for a particular purpose. I hereby waive and release [place], its agents, employees, officers, directors, representatives, insurers, attorneys, assigns, successors, subsidiaries, and affiliates from any and all past, present, or future claims, demands, liabilities, actions, causes of action, or suits of any kind directly or indirectly arising from acceptance and use of the [employer]. I further waive and release [place] and its affiliates from all present and future liability and responsibility for any injury or death to persons or damages to property caused by or related to the use of the [employer]. I have read this Waiver and Release of Liability, and I understand the terms used in it and  their legal significance. This Waiver is freely and  voluntarily given with the understanding that my right (as well as the right of any minor child for whom I am the parent or legal guardian using the [employer]) to legal recourse against [place] in connection with the [employer] is knowingly surrendered in return for use of these services.   I attest that I read the consent document to Brittany Hacking [place], gave Ms. Killion the opportunity to ask questions and answered the questions asked (if any). I affirm that Puja Laflin Meenach then provided consent for she's participation in this program.     Dione Booze

## 2022-12-08 ENCOUNTER — Telehealth: Payer: Self-pay | Admitting: *Deleted

## 2022-12-08 NOTE — Telephone Encounter (Signed)
Telephone encounter was:  Successful.  12/08/2022 Name: KURTIS ELBAUM MRN: [id number] DOB: Nov 30, 1945  Georga Hacking Titus is a 77 y.o. year old female who is a primary care patient of Allegra Grana, FNP . The community resource team was consulted for assistance with Transportation Needs  Patient needing transportation to dr appt will call back with details   Jacqui Irias Loughridge DOB: 1945-10-16 MRN: [id number]   RIDER WAIVER AND RELEASE OF LIABILITY  For purposes of improving physical access to our facilities, [facility] is pleased to partner with third parties to provide [facility] patients or other authorized individuals the option of convenient, on-demand ground transportation services (the [employer]") through use of the technology service that enables users to request on-demand ground transportation from independent third-party providers.  By opting to use and accept these [employer], I, the undersigned, hereby agree on behalf of myself, and on behalf of any minor child using the Science writer for whom I am the parent or legal guardian, as follows:  Science writer provided to me are provided by independent third-party transportation providers who are not [employer] or employees and who are unaffiliated with [employer]. [place] is neither a transportation carrier nor a common or public carrier. [place] has no control over the quality or safety of the transportation that occurs as a result of the [employer]. [place] cannot guarantee that any third-party transportation provider will complete any arranged transportation service. [facility] makes no representation, warranty, or guarantee regarding the reliability, timeliness, quality, safety, suitability, or availability of any of the Transport Services or that they will be error free. I fully understand that traveling by vehicle involves risks and dangers of serious bodily injury, including  permanent disability, paralysis, and death. I agree, on behalf of myself and on behalf of any minor child using the Transport Services for whom I am the parent or legal guardian, that the entire risk arising out of my use of the [employer] remains solely with me, to the maximum extent permitted under applicable law. The [employer] are provided "as is" and "as available." [place] disclaims all representations and warranties, express, implied or statutory, not expressly set out in these terms, including the implied warranties of merchantability and fitness for a particular purpose. I hereby waive and release [place], its agents, employees, officers, directors, representatives, insurers, attorneys, assigns, successors, subsidiaries, and affiliates from any and all past, present, or future claims, demands, liabilities, actions, causes of action, or suits of any kind directly or indirectly arising from acceptance and use of the [employer]. I further waive and release [place] and its affiliates from all present and future liability and responsibility for any injury or death to persons or damages to property caused by or related to the use of the [employer]. I have read this Waiver and Release of Liability, and I understand the terms used in it and their legal significance. This Waiver is freely and voluntarily given with the understanding that my right (as well as the right of any minor child for whom I am the parent or legal guardian using the [employer]) to legal recourse against  in connection with the [employer] is knowingly surrendered in return for use of these services.   I attest that I read the consent document to Georga Hacking [place], gave Ms. Soeder the opportunity to ask questions and answered the questions asked (if any). I affirm that  Georga Hacking Shareef then provided consent for she's participation in this program.     [facility] guide performed the following interventions: Patient provided with information about care guide support team and interviewed to confirm resource needs.  Follow Up Plan: Will call the patient back if she does not reach out   Morgen Ritacco Greenauer -[facility], Population Health [phone] [address] [place] , [place] [address] Email : [email or web address]

## 2022-12-14 ENCOUNTER — Other Ambulatory Visit: Payer: Medicare HMO

## 2022-12-31 ENCOUNTER — Telehealth: Payer: Self-pay | Admitting: Family

## 2022-12-31 ENCOUNTER — Encounter (INDEPENDENT_AMBULATORY_CARE_PROVIDER_SITE_OTHER): Payer: Medicare HMO | Admitting: Ophthalmology

## 2022-12-31 NOTE — Telephone Encounter (Signed)
Copied from CRM [id number]. Topic: Medicare AWV >> Dec 31, 2022 10:42 AM Payton Doughty wrote: Reason for CRM: LM 12/31/2022 to schedule AWV   Verlee Rossetti; Care Guide Ambulatory Clinical Support [place] [facility] Direct Dial: [phone]

## 2023-01-01 ENCOUNTER — Encounter (INDEPENDENT_AMBULATORY_CARE_PROVIDER_SITE_OTHER): Payer: Medicare HMO | Admitting: Ophthalmology

## 2023-01-08 ENCOUNTER — Encounter (INDEPENDENT_AMBULATORY_CARE_PROVIDER_SITE_OTHER): Payer: Medicare HMO | Admitting: Ophthalmology

## 2023-01-08 DIAGNOSIS — H43813 Vitreous degeneration, bilateral: Secondary | ICD-10-CM | POA: Diagnosis not present

## 2023-01-08 DIAGNOSIS — I10 Essential (primary) hypertension: Secondary | ICD-10-CM

## 2023-01-08 DIAGNOSIS — H35033 Hypertensive retinopathy, bilateral: Secondary | ICD-10-CM | POA: Diagnosis not present

## 2023-01-08 DIAGNOSIS — H353231 Exudative age-related macular degeneration, bilateral, with active choroidal neovascularization: Secondary | ICD-10-CM | POA: Diagnosis not present

## 2023-01-14 ENCOUNTER — Ambulatory Visit (INDEPENDENT_AMBULATORY_CARE_PROVIDER_SITE_OTHER): Payer: Medicare HMO | Admitting: Family

## 2023-01-14 ENCOUNTER — Ambulatory Visit: Payer: Medicare HMO | Admitting: Family

## 2023-01-14 ENCOUNTER — Encounter: Payer: Self-pay | Admitting: Family

## 2023-01-14 VITALS — BP 120/70 | HR 77 | Temp 98.4°F | Ht 60.0 in | Wt 112.0 lb

## 2023-01-14 DIAGNOSIS — I10 Essential (primary) hypertension: Secondary | ICD-10-CM | POA: Diagnosis not present

## 2023-01-14 DIAGNOSIS — E782 Mixed hyperlipidemia: Secondary | ICD-10-CM

## 2023-01-14 DIAGNOSIS — I25118 Atherosclerotic heart disease of native coronary artery with other forms of angina pectoris: Secondary | ICD-10-CM | POA: Diagnosis not present

## 2023-01-14 DIAGNOSIS — M5136 Other intervertebral disc degeneration, lumbar region: Secondary | ICD-10-CM

## 2023-01-14 DIAGNOSIS — F419 Anxiety disorder, unspecified: Secondary | ICD-10-CM

## 2023-01-14 DIAGNOSIS — R748 Abnormal levels of other serum enzymes: Secondary | ICD-10-CM

## 2023-01-14 MED ORDER — EZETIMIBE 10 MG PO TABS
10.0000 mg | ORAL_TABLET | Freq: Every day | ORAL | 3 refills | Status: DC
Start: 2023-01-14 — End: 2023-10-22

## 2023-01-14 NOTE — Assessment & Plan Note (Addendum)
Controlled. Continue metoprolol tartrate 12.5 mg bid

## 2023-01-14 NOTE — Assessment & Plan Note (Signed)
Symptomatically stable.  Continue Zetia 10 mg daily, Crestor 10 mg every day Lab Results  Component Value Date   LDLCALC 69 10/12/2022

## 2023-01-14 NOTE — Progress Notes (Signed)
Assessment & Plan:  Essential hypertension, benign Assessment & Plan: Controlled. Continue metoprolol tartrate 12.5 mg bid   Elevated liver enzymes -     Hemoglobin A1c -     Hepatic function panel  Coronary artery disease of native artery of native heart with stable angina pectoris [facility]) Assessment & Plan: Symptomatically stable.  Continue Zetia 10 mg daily, Crestor 10 mg every day Lab Results  Component Value Date   LDLCALC 69 10/12/2022      DDD (degenerative disc disease), lumbar  Anxiety  Mixed hyperlipidemia -     Ezetimibe; Take 1 tablet (10 mg total) by mouth daily.  Dispense: 90 tablet; Refill: 3     Return precautions given.   Risks, benefits, and alternatives of the medications and treatment plan prescribed today were discussed, and patient expressed understanding.   Education regarding symptom management and diagnosis given to patient on AVS either electronically or printed.  Return in about 6 months (around 07/14/2023).  Rennie Plowman, FNP  Subjective:    Patient ID: [name], female    DOB: 06/19/1945, 77 y.o.   MRN: [id number]  CC: [name] is a 77 y.o. female who presents today for follow up.   HPI: Feels well today.  No new complaints.  Continues to follow with Dr. Park Breed for pain management and compliant with oxycodone.  She is taking gabapentin 400 mg 3 times daily.  Denies chest pain, shortness of breath.    Allergies: Patient has no known allergies. Current Outpatient Medications on File Prior to Visit  Medication Sig Dispense Refill   aspirin 81 MG EC tablet Take 81 mg by mouth daily.       calcium carbonate (TUMS - DOSED IN MG ELEMENTAL CALCIUM) 500 MG chewable tablet Chew 1 tablet (200 mg of elemental calcium total) by mouth 2 (two) times daily with a meal.     Cholecalciferol (VITAMIN D3) 1000 UNITS CAPS Take 1,000 Units by mouth daily.     doxylamine, Sleep, (SLEEP AID) 25 MG tablet Take 25 mg by mouth at bedtime as needed.       gabapentin (NEURONTIN) 400 MG capsule TAKE 1 CAPSULE BY MOUTH 3 TIMES DAILY 90 capsule 4   metoprolol tartrate (LOPRESSOR) 25 MG tablet TAKE 1/2 TABLET BY MOUTH TWICE DAILY. 60 tablet 2   naloxone (NARCAN) nasal spray 4 mg/0.1 mL 4 or 8 mg (1 spray) intranasally every 2 to 3 minutes in alternating nostrils as needed. Seek immediate medical assistance after administration of the first dose. 2 each 2   pantoprazole (PROTONIX) 40 MG tablet TAKE 1 TABLET BY MOUTH ONCE DAILY 90 tablet 4   Probiotic Product (RISA-BID PROBIOTIC PO) Take 250 mg by mouth daily.      Pumpkin Seed-Soy Germ (AZO BLADDER CONTROL/GO-LESS) CAPS Take 1 tablet by mouth 3 (three) times daily.     rosuvastatin (CRESTOR) 10 MG tablet TAKE 1 TABLET BY MOUTH AT BEDTIME 90 tablet 3   sertraline (ZOLOFT) 50 MG tablet Take 1 tablet (50 mg total) by mouth daily. 90 tablet 3   vitamin B-12 (CYANOCOBALAMIN) 1000 MCG tablet Take 1,000 mcg by mouth daily.     XTAMPZA ER 9 MG C12A Take 1 capsule by mouth 2 (two) times daily.     No current facility-administered medications on file prior to visit.    Review of Systems  Constitutional:  Negative for chills and fever.  Respiratory:  Negative for cough.   Cardiovascular:  Negative for chest pain  and palpitations.  Gastrointestinal:  Negative for nausea and vomiting.  Musculoskeletal:  Positive for back pain.      Objective:    BP 120/70   Pulse 77   Temp 98.4 F (36.9 C) (Oral)   Ht 5' (1.524 m)   Wt 112 lb (50.8 kg)   SpO2 97%   BMI 21.87 kg/m  BP Readings from Last 3 Encounters:  01/14/23 120/70  10/12/22 118/72  09/15/22 (!) 108/58   Wt Readings from Last 3 Encounters:  01/14/23 112 lb (50.8 kg)  10/12/22 118 lb (53.5 kg)  09/15/22 118 lb (53.5 kg)    Physical Exam Vitals reviewed.  Constitutional:      Appearance: She is well-developed.  Eyes:     Conjunctiva/sclera: Conjunctivae normal.  Cardiovascular:     Rate and Rhythm: Normal rate and regular rhythm.      Pulses: Normal pulses.     Heart sounds: Normal heart sounds.  Pulmonary:     Effort: Pulmonary effort is normal.     Breath sounds: Normal breath sounds. No wheezing, rhonchi or rales.  Skin:    General: Skin is warm and dry.  Neurological:     Mental Status: She is alert.  Psychiatric:        Speech: Speech normal.        Behavior: Behavior normal.        Thought Content: Thought content normal.

## 2023-01-14 NOTE — Patient Instructions (Signed)
Please schedule your mammogram and bone density at [facility] below. I have already ordered your mammogram   [facility] [address]  [place], [place] [address] Phone: [phone]

## 2023-01-15 LAB — HEPATIC FUNCTION PANEL
ALT: 20 U/L (ref 0–35)
AST: 29 U/L (ref 0–37)
Albumin: 4 g/dL (ref 3.5–5.2)
Alkaline Phosphatase: 57 U/L (ref 39–117)
Bilirubin, Direct: 0.1 mg/dL (ref 0.0–0.3)
Total Bilirubin: 0.5 mg/dL (ref 0.2–1.2)
Total Protein: 6.4 g/dL (ref 6.0–8.3)

## 2023-01-15 LAB — HEMOGLOBIN A1C: Hgb A1c MFr Bld: 5.6 % (ref 4.6–6.5)

## 2023-01-17 ENCOUNTER — Other Ambulatory Visit: Payer: Self-pay | Admitting: Family

## 2023-01-17 DIAGNOSIS — F419 Anxiety disorder, unspecified: Secondary | ICD-10-CM

## 2023-01-17 MED ORDER — SERTRALINE HCL 50 MG PO TABS: 100.0000 mg | ORAL_TABLET | Freq: Every day | ORAL | 3 refills | Status: DC

## 2023-01-21 ENCOUNTER — Ambulatory Visit: Payer: Medicare HMO | Admitting: Family

## 2023-01-25 ENCOUNTER — Ambulatory Visit: Payer: Medicare HMO | Admitting: Urology

## 2023-02-01 ENCOUNTER — Encounter (INDEPENDENT_AMBULATORY_CARE_PROVIDER_SITE_OTHER): Payer: Medicare HMO | Admitting: Ophthalmology

## 2023-02-15 ENCOUNTER — Ambulatory Visit (INDEPENDENT_AMBULATORY_CARE_PROVIDER_SITE_OTHER): Payer: Medicare HMO | Admitting: Urology

## 2023-02-15 ENCOUNTER — Encounter: Payer: Self-pay | Admitting: Urology

## 2023-02-15 DIAGNOSIS — N3281 Overactive bladder: Secondary | ICD-10-CM | POA: Diagnosis not present

## 2023-02-15 MED ORDER — MIRABEGRON ER 50 MG PO TB24: 50.0000 mg | ORAL_TABLET | Freq: Every day | ORAL | 11 refills | Status: DC

## 2023-02-15 MED ORDER — OXYBUTYNIN CHLORIDE ER 10 MG PO TB24: 10.0000 mg | ORAL_TABLET | Freq: Every day | ORAL | 11 refills | Status: DC

## 2023-02-15 NOTE — Progress Notes (Signed)
02/15/2023 8:17 AM   [name] April 20, 1946 [id number]  Referring provider: Allegra Grana, FNP [address] [place],  [place] [address]  Chief Complaint  Patient presents with   Follow-up    HPI: Was consulted to assess the patient's urinary incontinence.  She has urge incontinence.  She wears 2 pads during the day that are mildly wet.  She soaks a double pad at night.  No ankle edema.  Voids every 2-3 hours and has no nocturia.  Does not have stress incontinence.  She is having some issues moving her legs and has a wheelchair.  They are doing neurologic testing now.  No local perineal loss of sensation   She says she voids with a reasonable flow but then can touch her hip or buttock region and double void a small amount.  She does not do any splinting maneuvers  She is currently on Myrbetriq  Has not had a hysterectomy    Clinically patient describes high-volume bedwetting and urge incontinence during the day.  Has a risk factor for neurogenic bladder and both the bedwetting and leg symptoms started about 1 year ago.    Will come back on Gemtesa samples and prescription for pelvic examination cystoscopy.  She understands and may order a test in the future but because of her mobility I did not today Call if urine culture positive and get a residual next visit.  She understands that I will order the urodynamics if she is not greatly improved and the culture is negative but I did not explain in detail.  She had blood in the urine today but urine looked infected    Urgency incontinence dramatically better on the Gemtesa.   Cystoscopy: Patient underwent flexible cystoscopy.  Bladder mucosa and trigone were normal.  No carcinoma.  Urine had a lot of white flecks    Continue on Gemtesa. Reevaluate in 4 months. Call if culture positive    Today Frequency stable.  Last culture positive. Clinically not infected today.  I think overall she does well with Gemtesa but I  think she still leaks some.  She does not want to go and get urodynamics.  Gemtesa 90 x 3 sent to pharmacy and I will see in 1 year. We will try to get a culture and I will call her if positive   Today Frequency is stable.  Gemtesa no longer working well so she stopped it.  She can leak without awareness sitting in her wheelchair but also with sudden urgency.  Clinically not infected and could not leave a sample   PMH: Past Medical History:  Diagnosis Date   Abnormal Pap smear of cervix    Dr. Thomasene Mohair    Coronary artery disease    a. 06/2009 MI/PCI to LCX and LAD; b. 01/2011 Occluded stents-->CABG x 3 ([facility]): LIMA->LAD, VG->LCX, VG->RPDA; c. 01/2012 Cath: LM 50, LAD 95ost, 11m, LCX 95ost, 113m, RCA 82m, RPDA small, LIMA->LAD ok, VG->LCX ok, VG->RPDA 100-->Med rx.   Degenerative disc disease, lumbar    L4/5 noted CT ab/pelvis 05/19/11    DVT (deep venous thrombosis) (HCC)    after childbirth age 73   GERD (gastroesophageal reflux disease)    Hyperlipidemia    Hypertension    under control   Macular degeneration    Myocardial infarction [facility]) 2011   Parathyroid adenoma    right 13 x 8 mm s/p resection    PVD (peripheral vascular disease) (HCC)    a. 01/2010  s/p Aortofemoral bypass.    Surgical History: Past Surgical History:  Procedure Laterality Date   CARDIAC CATHETERIZATION  2013   [facility]   CATARACT EXTRACTION     COLONOSCOPY     COLPOSCOPY     CORONARY ARTERY BYPASS GRAFT  02/07/2010   x 3   ERCP N/A 10/05/2019   Procedure: ENDOSCOPIC RETROGRADE CHOLANGIOPANCREATOGRAPHY (ERCP);  Surgeon: Midge Minium, MD;  Location: [facility] ENDOSCOPY;  Service: Endoscopy;  Laterality: N/A;   FACIAL COSMETIC SURGERY  2013   Dr. Gertie Baron   FOOT SURGERY     right   ILIO-FEMORAL BYPASS GRAFT  2011   LIVER BIOPSY N/A 10/31/2019   Procedure: LIVER BIOPSY;  Surgeon: Henrene Dodge, MD;  Location: [facility] ORS;  Service: General;  Laterality: N/A;   MITRAL VALVE REPAIR  01/2010   ORIF ANKLE  FRACTURE Right 10/08/2016   Procedure: OPEN REDUCTION INTERNAL FIXATION (ORIF) ANKLE FRACTURE;  Surgeon: Christena Flake, MD;  Location: [facility] ORS;  Service: Orthopedics;  Laterality: Right;   ORIF HUMERUS FRACTURE Right 12/20/2019   Procedure: OPEN REDUCTION INTERNAL FIXATION (ORIF) DISTAL HUMERUS FRACTURE;  Surgeon: Roby Lofts, MD;  Location: [facility] OR;  Service: Orthopedics;  Laterality: Right;   ORIF WRIST FRACTURE Right 12/29/2018   Procedure: OPEN REDUCTION INTERNAL FIXATION (ORIF) WRIST FRACTURE;  Surgeon: Christena Flake, MD;  Location: [facility] ORS;  Service: Orthopedics;  Laterality: Right;   PARATHYROIDECTOMY     TUBAL LIGATION      Home Medications:  Allergies as of 02/15/2023   No Known Allergies      Medication List        Accurate as of February 15, 2023  8:17 AM. If you have any questions, ask your nurse or doctor.          aspirin EC 81 MG tablet Take 81 mg by mouth daily.   AZO Bladder Control/Go-Less Caps Take 1 tablet by mouth 3 (three) times daily.   calcium carbonate 500 MG chewable tablet Commonly known as: TUMS - dosed in mg elemental calcium Chew 1 tablet (200 mg of elemental calcium total) by mouth 2 (two) times daily with a meal.   cyanocobalamin 1000 MCG tablet Commonly known as: VITAMIN B12 Take 1,000 mcg by mouth daily.   ezetimibe 10 MG tablet Commonly known as: ZETIA Take 1 tablet (10 mg total) by mouth daily.   gabapentin 400 MG capsule Commonly known as: NEURONTIN TAKE 1 CAPSULE BY MOUTH 3 TIMES DAILY   metoprolol tartrate 25 MG tablet Commonly known as: LOPRESSOR TAKE 1/2 TABLET BY MOUTH TWICE DAILY.   naloxone 4 MG/0.1ML Liqd nasal spray kit Commonly known as: NARCAN 4 or 8 mg (1 spray) intranasally every 2 to 3 minutes in alternating nostrils as needed. Seek immediate medical assistance after administration of the first dose.   pantoprazole 40 MG tablet Commonly known as: PROTONIX TAKE 1 TABLET BY MOUTH ONCE DAILY   RISA-BID  PROBIOTIC PO Take 250 mg by mouth daily.   rosuvastatin 10 MG tablet Commonly known as: CRESTOR TAKE 1 TABLET BY MOUTH AT BEDTIME   sertraline 50 MG tablet Commonly known as: ZOLOFT Take 2 tablets (100 mg total) by mouth daily.   Sleep Aid 25 MG tablet Generic drug: doxylamine (Sleep) Take 25 mg by mouth at bedtime as needed.   Vitamin D3 25 MCG (1000 UT) Caps Take 1,000 Units by mouth daily.   Xtampza ER 9 MG C12a Generic drug: oxyCODONE ER Take 1 capsule by mouth 2 (  two) times daily.        Allergies: No Known Allergies  Family History: Family History  Problem Relation Age of Onset   Heart disease Father     Social History:  reports that she quit smoking about 13 years ago. Her smoking use included cigarettes. She started smoking about 57 years ago. She has a 44 pack-year smoking history. She has been exposed to tobacco smoke. She has never used smokeless tobacco. She reports current alcohol use of about 1.0 standard drink of alcohol per week. She reports that she does not use drugs.  ROS:                                        Physical Exam: There were no vitals taken for this visit.  Constitutional:  Alert and oriented, No acute distress. HEENT: [place] AT, moist mucus membranes.  Trachea midline, no masses.   Laboratory Data: Lab Results  Component Value Date   WBC 7.1 08/05/2022   HGB 14.6 08/05/2022   HCT 44.1 08/05/2022   MCV 96.7 08/05/2022   PLT 213 08/05/2022    Lab Results  Component Value Date   CREATININE 1.04 10/12/2022    No results found for: "PSA"  No results found for: "TESTOSTERONE"  Lab Results  Component Value Date   HGBA1C 5.6 01/14/2023    Urinalysis    Component Value Date/Time   COLORURINE YELLOW 10/13/2021 1436   APPEARANCEUR Hazy (A) 01/12/2022 1452   LABSPEC 1.015 10/13/2021 1436   LABSPEC 1.003 06/27/2011 1253   PHURINE 5.5 10/13/2021 1436   GLUCOSEU Negative 01/12/2022 1452   GLUCOSEU  NEGATIVE 10/13/2021 1436   HGBUR NEGATIVE 10/13/2021 1436   BILIRUBINUR Negative 01/12/2022 1452   BILIRUBINUR Negative 06/27/2011 1253   KETONESUR NEGATIVE 10/13/2021 1436   PROTEINUR Negative 01/12/2022 1452   PROTEINUR NEGATIVE 11/07/2020 1619   UROBILINOGEN 0.2 10/13/2021 1436   NITRITE Positive (A) 01/12/2022 1452   NITRITE NEGATIVE 10/13/2021 1436   LEUKOCYTESUR 2+ (A) 01/12/2022 1452   LEUKOCYTESUR MODERATE (A) 10/13/2021 1436   LEUKOCYTESUR Negative 06/27/2011 1253    Pertinent Imaging:   Assessment & Plan: Patient has urge incontinence and leakage without awareness.  I explained why she did not need a bladder lift.  I held off on mentioning urodynamics.  She understands that I usually try 3 medications.  I called in oxybutynin ER 10 mg 30 x 11.  I had a circular conversation about explaining treatment pathway.  I did mention Botox as an example.  She can toilet on her own and use a walker.  She has had some bladder infections in the past and I will try to get a culture next time.  I will encourage urodynamics next visit and discussed refractory treatments as well.   1. OAB (overactive bladder)  - Urinalysis, Complete   No follow-ups on file.  Martina Sinner, MD  [facility] [address] [place], [place] [address] [phone]

## 2023-03-29 ENCOUNTER — Ambulatory Visit: Payer: Medicare HMO | Admitting: Urology

## 2023-04-02 ENCOUNTER — Encounter (INDEPENDENT_AMBULATORY_CARE_PROVIDER_SITE_OTHER): Payer: Medicare HMO | Admitting: Ophthalmology

## 2023-04-02 DIAGNOSIS — H35033 Hypertensive retinopathy, bilateral: Secondary | ICD-10-CM

## 2023-04-02 DIAGNOSIS — I10 Essential (primary) hypertension: Secondary | ICD-10-CM

## 2023-04-02 DIAGNOSIS — H353231 Exudative age-related macular degeneration, bilateral, with active choroidal neovascularization: Secondary | ICD-10-CM

## 2023-04-02 DIAGNOSIS — H43813 Vitreous degeneration, bilateral: Secondary | ICD-10-CM

## 2023-04-09 ENCOUNTER — Other Ambulatory Visit: Payer: Self-pay | Admitting: Family

## 2023-05-03 NOTE — Progress Notes (Deleted)
 Cardiology Office Note  Date:  05/03/2023   ID:  [name], DOB May 31, 1945, MRN [id number]  PCP:  Brittany Rollene MATSU, FNP   No chief complaint on file.   HPI:  [name] is a pleasant 78 year old woman with a history of  coronary artery disease, stent to her left circumflex and 2 stents to the LAD in March of 2011, aorto femoral bypass grafting in early October 2011,   with occlusion of her coronary stents in mid-October requiring bypass surgery at [facility] in mid to late October 2012.  Cath 2013: occluded SVG to PDA thyroid  surgery March 2014 long history of smoking though stopped in March of 2011 plastic surgery on her face in the past She presents for routine followup of her coronary artery disease.  LOV 3/23  On prior office visits we had discussed her sedentary lifestyle, Radical weight loss ,missing meals  anorexia, down more than 15 pounds  In the hospital August 2022 numbness, tingling bilateral lower extremities upper extremities, unable to ambulate Had been placed on IV copper  infusion per neurology Etiology of copper  deficiency was unclear  In follow-up today she reports having a difficult time, participating in rehab [facility] doing therapy, getting stronger This weekend, moving into her own appt in [place]  Followed by neurology for leg weakness Has had had 2 lumbar spine MRIs Multiple small chronic infarcts  Chronic back pain Does not want surgery  Presents today in a wheelchair Denies leg swelling, no shortness of breath no PND orthopnea  EKG personally reviewed by myself on todays visit Normal sinus rhythm rate 68 bpm nonspecific ST abnormality  Other past medical history reviewed Cardiac catheterization October 2013  Gallbladder attack April 2019 Declined surgery   cardiac catheterization October 2013 showed LIMA to the LAD, vein graft to the left circumflex, vein graft to the PDA which is occluded that supplies a very small area. Left main  has 50% disease, ostial LAD has 95% in-stent restenosis, mid LAD has 100% disease, 75% ostial circumflex, 100% mid circumflex, very small OM1, 30% RCA disease, ejection fraction 45%   Stress test shows suboptimal images due to GI activity on resting images, significant anterior wall ischemia in the mid to distal segments, reduced ejection fraction of 47%    Prior ventricular arrhythmia when her stents were placed in the Cath Lab requiring shock.    echocardiogram improved, EF  greater than 40%.   Cardiac catheter report from July 15 2009  indicate she had a vision bare metal stent 2.75 x 28 mm in the LCX, mini vision bare metal stent 2.5 x 28 and 2.5 x 15 mm stent to the LAD.    DJD in her neck and back  PMH:   has a past medical history of Abnormal Pap smear of cervix, Coronary artery disease, Degenerative disc disease, lumbar, DVT (deep venous thrombosis) (HCC), GERD (gastroesophageal reflux disease), Hyperlipidemia, Hypertension, Macular degeneration, Myocardial infarction (HCC) (2011), Parathyroid  adenoma, and PVD (peripheral vascular disease) (HCC).  PSH:    Past Surgical History:  Procedure Laterality Date   CARDIAC CATHETERIZATION  2013   [facility]   CATARACT EXTRACTION     COLONOSCOPY     COLPOSCOPY     CORONARY ARTERY BYPASS GRAFT  02/07/2010   x 3   ERCP N/A 10/05/2019   Procedure: ENDOSCOPIC RETROGRADE CHOLANGIOPANCREATOGRAPHY (ERCP);  Surgeon: Jinny Carmine, MD;  Location[facility] ENDOSCOPY;  Service: Endoscopy;  Laterality: N/A;   FACIAL COSMETIC SURGERY  2013   Dr.  Madison Clark   FOOT SURGERY     right   ILIO-FEMORAL BYPASS GRAFT  2011   LIVER BIOPSY N/A 10/31/2019   Procedure: LIVER BIOPSY;  Surgeon: Desiderio Schanz, MD;  Location: [facility] ORS;  Service: General;  Laterality: N/A;   MITRAL VALVE REPAIR  01/2010   ORIF ANKLE FRACTURE Right 10/08/2016   Procedure: OPEN REDUCTION INTERNAL FIXATION (ORIF) ANKLE FRACTURE;  Surgeon: Edie Norleen PARAS, MD;  Location: [facility] ORS;  Service:  Orthopedics;  Laterality: Right;   ORIF HUMERUS FRACTURE Right 12/20/2019   Procedure: OPEN REDUCTION INTERNAL FIXATION (ORIF) DISTAL HUMERUS FRACTURE;  Surgeon: Kendal Franky SQUIBB, MD;  Location: [facility] OR;  Service: Orthopedics;  Laterality: Right;   ORIF WRIST FRACTURE Right 12/29/2018   Procedure: OPEN REDUCTION INTERNAL FIXATION (ORIF) WRIST FRACTURE;  Surgeon: Edie Norleen PARAS, MD;  Location: [facility] ORS;  Service: Orthopedics;  Laterality: Right;   PARATHYROIDECTOMY     TUBAL LIGATION      Current Outpatient Medications  Medication Sig Dispense Refill   aspirin  81 MG EC tablet Take 81 mg by mouth daily.       calcium  carbonate (TUMS - DOSED IN MG ELEMENTAL CALCIUM ) 500 MG chewable tablet Chew 1 tablet (200 mg of elemental calcium  total) by mouth 2 (two) times daily with a meal.     Cholecalciferol  (VITAMIN D3) 1000 UNITS CAPS Take 1,000 Units by mouth daily.     doxylamine , Sleep, (SLEEP AID) 25 MG tablet Take 25 mg by mouth at bedtime as needed.      ezetimibe  (ZETIA ) 10 MG tablet Take 1 tablet (10 mg total) by mouth daily. 90 tablet 3   gabapentin  (NEURONTIN ) 400 MG capsule TAKE 1 CAPSULE BY MOUTH 3 TIMES DAILY 90 capsule 4   metoprolol  tartrate (LOPRESSOR ) 25 MG tablet TAKE 1/2 TABLET BY MOUTH TWICE DAILY. 60 tablet 2   naloxone  (NARCAN ) nasal spray 4 mg/0.1 mL 4 or 8 mg (1 spray) intranasally every 2 to 3 minutes in alternating nostrils as needed. Seek immediate medical assistance after administration of the first dose. 2 each 2   oxybutynin  (DITROPAN -XL) 10 MG 24 hr tablet Take 1 tablet (10 mg total) by mouth at bedtime. 30 tablet 11   pantoprazole  (PROTONIX ) 40 MG tablet TAKE 1 TABLET BY MOUTH ONCE DAILY 90 tablet 4   Probiotic Product (RISA-BID PROBIOTIC PO) Take 250 mg by mouth daily.      Pumpkin Seed-Soy Germ (AZO BLADDER CONTROL/GO-LESS) CAPS Take 1 tablet by mouth 3 (three) times daily.     rosuvastatin  (CRESTOR ) 10 MG tablet TAKE 1 TABLET BY MOUTH AT BEDTIME 90 tablet 3   sertraline   (ZOLOFT ) 50 MG tablet Take 2 tablets (100 mg total) by mouth daily. 180 tablet 3   vitamin B-12 (CYANOCOBALAMIN ) 1000 MCG tablet Take 1,000 mcg by mouth daily.     XTAMPZA  ER 9 MG C12A Take 1 capsule by mouth 2 (two) times daily.     No current facility-administered medications for this visit.    Allergies:   Patient has no known allergies.   Social History:  The patient  reports that she quit smoking about 13 years ago. Her smoking use included cigarettes. She started smoking about 57 years ago. She has a 44 pack-year smoking history. She has been exposed to tobacco smoke. She has never used smokeless tobacco. She reports current alcohol  use of about 1.0 standard drink of alcohol  per week. She reports that she does not use drugs.   Family History:  family history includes Heart disease in her father.    Review of Systems: Review of Systems  Constitutional: Negative.   HENT: Negative.    Respiratory: Negative.    Cardiovascular: Negative.   Gastrointestinal: Negative.   Musculoskeletal:  Positive for back pain.       Leg swelling  Neurological: Negative.   Psychiatric/Behavioral: Negative.    All other systems reviewed and are negative.   PHYSICAL EXAM: VS:  There were no vitals taken for this visit. , BMI There is no height or weight on file to calculate BMI. Constitutional:  oriented to person, place, and time. No distress.  HENT:  Head: Grossly normal Eyes:  no discharge. No scleral icterus.  Neck: No JVD, no carotid bruits  Cardiovascular: Regular rate and rhythm, no murmurs appreciated Pulmonary/Chest: Clear to auscultation bilaterally, no wheezes or rails Abdominal: Soft.  no distension.  no tenderness.  Musculoskeletal: Normal range of motion Neurological:  normal muscle tone. Coordination normal. No atrophy Skin: Skin warm and dry Psychiatric: normal affect, pleasant  Recent Labs: 08/05/2022: Hemoglobin 14.6; Platelets 213 10/12/2022: BUN 8; Creatinine, Ser 1.04;  Potassium 3.7; Sodium 137; TSH 4.32 01/14/2023: ALT 20    Lipid Panel Lab Results  Component Value Date   CHOL 169 10/12/2022   HDL 85.70 10/12/2022   LDLCALC 69 10/12/2022   TRIG 69.0 10/12/2022      Wt Readings from Last 3 Encounters:  01/14/23 112 lb (50.8 kg)  10/12/22 118 lb (53.5 kg)  09/15/22 118 lb (53.5 kg)     ASSESSMENT AND PLAN:  Mixed hyperlipidemia  continue on Zetia  and Crestor  Goal LDL less than 70  Coronary atherosclerosis of autologous vein bypass graft without angina -  Prior smoking history Currently with no symptoms of angina. No further workup at this time. Continue current medication regimen.  PVD (peripheral vascular disease) (HCC) -  Followed by vascular Denies claudication symptoms Will need periodic lower extremity arterial Dopplers  Essential hypertension, benign -  Blood pressure is well controlled on today's visit. No changes made to the medications.  Class 1 obesity due to excess calories without serious comorbidity with body mass index (BMI) of 33.0 to 33.9 in adult Weight has trended down, had issues with anorexia last year leading to serious medical condition, profound weakness Appears diet has stabilized   Total encounter time more than 30 minutes  Greater than 50% was spent in counseling and coordination of care with the patient    No orders of the defined types were placed in this encounter.   Signed, Velinda Lunger, M.D., Ph.D. 05/03/2023  [facility] [place], [place] [phone]

## 2023-05-04 ENCOUNTER — Ambulatory Visit: Payer: Medicare HMO | Attending: Cardiovascular Disease | Admitting: Cardiovascular Disease

## 2023-05-04 DIAGNOSIS — E785 Hyperlipidemia, unspecified: Secondary | ICD-10-CM

## 2023-05-04 DIAGNOSIS — Z87891 Personal history of nicotine dependence: Secondary | ICD-10-CM

## 2023-05-04 DIAGNOSIS — I10 Essential (primary) hypertension: Secondary | ICD-10-CM

## 2023-05-04 DIAGNOSIS — I739 Peripheral vascular disease, unspecified: Secondary | ICD-10-CM

## 2023-05-04 DIAGNOSIS — I25718 Atherosclerosis of autologous vein coronary artery bypass graft(s) with other forms of angina pectoris: Secondary | ICD-10-CM

## 2023-05-04 DIAGNOSIS — I25118 Atherosclerotic heart disease of native coronary artery with other forms of angina pectoris: Secondary | ICD-10-CM

## 2023-05-17 ENCOUNTER — Ambulatory Visit (INDEPENDENT_AMBULATORY_CARE_PROVIDER_SITE_OTHER): Payer: Medicare HMO | Admitting: Urology

## 2023-05-17 ENCOUNTER — Ambulatory Visit: Payer: Medicare HMO | Admitting: Urology

## 2023-05-17 VITALS — BP 109/76 | HR 75

## 2023-05-17 DIAGNOSIS — R3915 Urgency of urination: Secondary | ICD-10-CM

## 2023-05-17 DIAGNOSIS — N3281 Overactive bladder: Secondary | ICD-10-CM

## 2023-05-17 NOTE — Patient Instructions (Signed)
Urgent PC office-based treatment for overactive bladder Take back control of your life! Urgent PC is a non-drug, non-surgical option for overactive bladder and associated symptoms of urinary urgency, urinary frequency and urge incontinence  Urgent-PC- Since 2003, healthcare professionals have used the Urgent PC Neuromodulation System as an effective office treatment for men and women suffering from overactive bladder, a condition commonly referred to as OAB. Urgent PC is up to 80% effective, even after conservative measures and OAB drugs have failed. Plus, Urgent PC is very low risk, making it a great choice for people unable or unwilling to have more invasive procedures.  How does Urgent PC work?  The Urgent PC system delivers a specific type of neuromodulation called percutaneous tibial nerve stimulation (PTNS). During treatment, a small, slim needle electrode is inserted near your ankle. The needle electrode is then connected to the battery-powered stimulator. During your 30-minute treatment, mild impulses from the stimulator travel through the needle electrode, along your leg and to the nerves in your pelvis that control bladder function. This process is also referred to as neuromodulation.  What will I feel with Urgent PC therapy?  Because patients may experience the sensation of the Urgent PC therapy in different ways, it's difficult to say what the treatment would feel like to you. Patients often describe the sensation as "tingling" or "pulsating." Treatment is typically well-tolerated by patients. Urgent PC offers many different levels of stimulation, so your clinician will be able to adjust treatment to suit you as well as address any discomfort that you might experience during treatment.  How often will I need Urgent PC treatments? You will receive an initial series of 12 treatments scheduled about a week apart. If you respond, you will likely need a treatment about once per month to  maintain your improvements.  How soon will I see results with Urgent PC? Because Urgent PC gently modifies the signals to achieve bladder control, it usually takes 5-7 weeks for symptoms to change. However, patients respond at different rates. In a review of about 100 patients who had success with Urgent PC, symptoms improved anywhere between 2-12 weeks. For about 20% of these patients, the symptoms of urgency and/or urge incontinence didn't improve until after 8 weeks.1  There is no way to anticipate who will respond earlier, later or not at all. That's why it is important to receive the 12 recommended treatments before you and your physician evaluate whether this therapy is an appropriate and effective choice for you.  How can I receive treatment with Urgent PC? Urgent PC is an option for patients with OAB. If you think you have OAB, talk to your doctor, a urologist or urogynecologist. If you have OAB, the doctor will work with you to determine your own personal treatment plan which usually starts with behavior and diet modifications plus medications. Urgent PC is an excellent option if these options don't work or provide sufficient improvements. Treatment with Urgent PC is typically performed at the office of a urologist, urogynecologist or gynecologist. So, if you run out of options with your normal doctor, consider visiting one of these specialists.  Does insurance cover Urgent PC? Urgent PC treatment is reimbursed by Medicare across the [place]. Private insurance coverage varies by state. To see if your insurance company covers Urgent PC, use our Coverage Finder or talk to your Healthcare Provider.  Are there patients who should not be treated with Urgent PC? Yes, these include: patients with pacemakers or implantable defibrillators, patients  prone to excessive bleeding, patients with nerve damage that could impact either percutaneous tibial nerve or pelvic floor function and patients who  are pregnant or planning to become pregnant during the duration of the treatment.  What are the risks associated with Urgent PC? The risks associated with Urgent PC therapy are low. Most common side-effects are temporary and include mild pain or skin inflammation at or near the stimulation site.   BuaBotox Instructions:  1.Patient will come in for lab appointment 2 weeks before Botox to make sure that you do not have a urinary tract infection (UTI)  2. If infection we will give you antibiotics to prevent UTI. 3. Cipro will be sent to your pharmacy to be taken, the day before the procedure, the day of the procedure and the day after the procedure. 4. Please come 30 minutes early so that we may numb your bladder with a local anesthetic and wait for it to take effect. 5.Please temporarily stop taking antiplatelets (aspirin-like products) at least 3 days before treatment

## 2023-05-17 NOTE — Progress Notes (Signed)
05/17/2023 10:47 AM   [name] 05/16/1945 [id number]  Referring provider: Allegra Grana, FNP [address] [place],  [place] [address]  Chief Complaint  Patient presents with   Follow-up    HPI: Was consulted to assess the patient's urinary incontinence.  She has urge incontinence.  She wears 2 pads during the day that are mildly wet.  She soaks a double pad at night.  No ankle edema.  Voids every 2-3 hours and has no nocturia.  Does not have stress incontinence.  She is having some issues moving her legs and has a wheelchair.  They are doing neurologic testing now.  No local perineal loss of sensation   She says she voids with a reasonable flow but then can touch her hip or buttock region and double void a small amount.  She does not do any splinting maneuvers  She is currently on Myrbetriq  Has not had a hysterectomy    Clinically patient describes high-volume bedwetting and urge incontinence during the day.  Has a risk factor for neurogenic bladder and both the bedwetting and leg symptoms started about 1 year ago.    Will come back on Gemtesa samples and prescription for pelvic examination cystoscopy.  She understands and may order a test in the future but because of her mobility I did not today Call if urine culture positive and get a residual next visit.  She understands that I will order the urodynamics if she is not greatly improved and the culture is negative but I did not explain in detail.  She had blood in the urine today but urine looked infected    Urgency incontinence dramatically better on the Gemtesa.   Cystoscopy: Patient underwent flexible cystoscopy.  Bladder mucosa and trigone were normal.  No carcinoma.  Urine had a lot of white flecks    Continue on Gemtesa. Reevaluate in 4 months. Call if culture positive    Last culture positive. Clinically not infected today.  I think overall she does well with Gemtesa but I think she still leaks  some.  She does not want to go and get urodynamics.  Gemtesa 90 x 3 sent to pharmacy and I will see in 1 year. We will try to get a culture and I will call her if positive    Gemtesa no longer working well so she stopped it.  She can leak without awareness sitting in her wheelchair but also with sudden urgency.  Clinically not infected and could not leave a sample  Patient has urge incontinence and leakage without awareness.  I explained why she did not need a bladder lift.  I held off on mentioning urodynamics.  She understands that I usually try 3 medications.  I called in oxybutynin ER 10 mg 30 x 11.  I had a circular conversation about explaining treatment pathway.  I did mention Botox as an example.  She can toilet on her own and use a walker.  She has had some bladder infections in the past and I will try to get a culture next time.  I will encourage urodynamics next visit and discussed refractory treatments as well.     Today Patient may have had mild benefit from oxybutynin but had terrible dry mouth.  Clinically not infected but could not leave a sample.  Frequency stable.  Not always a strong historian.  Definitely still leaks without awareness and has urge incontinence as well  PMH: Past Medical History:  Diagnosis Date   Abnormal Pap smear of cervix    Dr. Thomasene Mohair    Coronary artery disease    a. 06/2009 MI/PCI to LCX and LAD; b. 01/2011 Occluded stents-->CABG x 3 ([facility]): LIMA->LAD, VG->LCX, VG->RPDA; c. 01/2012 Cath: LM 50, LAD 95ost, 156m, LCX 95ost, 162m, RCA 56m, RPDA small, LIMA->LAD ok, VG->LCX ok, VG->RPDA 100-->Med rx.   Degenerative disc disease, lumbar    L4/5 noted CT ab/pelvis 05/19/11    DVT (deep venous thrombosis) (HCC)    after childbirth age 25   GERD (gastroesophageal reflux disease)    Hyperlipidemia    Hypertension    under control   Macular degeneration    Myocardial infarction [facility]) 2011   Parathyroid adenoma    right 13 x 8 mm s/p resection    PVD  (peripheral vascular disease) (HCC)    a. 01/2010 s/p Aortofemoral bypass.    Surgical History: Past Surgical History:  Procedure Laterality Date   CARDIAC CATHETERIZATION  2013   [facility]   CATARACT EXTRACTION     COLONOSCOPY     COLPOSCOPY     CORONARY ARTERY BYPASS GRAFT  02/07/2010   x 3   ERCP N/A 10/05/2019   Procedure: ENDOSCOPIC RETROGRADE CHOLANGIOPANCREATOGRAPHY (ERCP);  Surgeon: Midge Minium, MD;  Location: [facility] ENDOSCOPY;  Service: Endoscopy;  Laterality: N/A;   FACIAL COSMETIC SURGERY  2013   Dr. Gertie Baron   FOOT SURGERY     right   ILIO-FEMORAL BYPASS GRAFT  2011   LIVER BIOPSY N/A 10/31/2019   Procedure: LIVER BIOPSY;  Surgeon: Henrene Dodge, MD;  Location: [facility] ORS;  Service: General;  Laterality: N/A;   MITRAL VALVE REPAIR  01/2010   ORIF ANKLE FRACTURE Right 10/08/2016   Procedure: OPEN REDUCTION INTERNAL FIXATION (ORIF) ANKLE FRACTURE;  Surgeon: Christena Flake, MD;  Location: [facility] ORS;  Service: Orthopedics;  Laterality: Right;   ORIF HUMERUS FRACTURE Right 12/20/2019   Procedure: OPEN REDUCTION INTERNAL FIXATION (ORIF) DISTAL HUMERUS FRACTURE;  Surgeon: Roby Lofts, MD;  Location: [facility] OR;  Service: Orthopedics;  Laterality: Right;   ORIF WRIST FRACTURE Right 12/29/2018   Procedure: OPEN REDUCTION INTERNAL FIXATION (ORIF) WRIST FRACTURE;  Surgeon: Christena Flake, MD;  Location: [facility] ORS;  Service: Orthopedics;  Laterality: Right;   PARATHYROIDECTOMY     TUBAL LIGATION      Home Medications:  Allergies as of 05/17/2023   No Known Allergies      Medication List        Accurate as of May 17, 2023 10:47 AM. If you have any questions, ask your nurse or doctor.          aspirin EC 81 MG tablet Take 81 mg by mouth daily.   AZO Bladder Control/Go-Less Caps Take 1 tablet by mouth 3 (three) times daily.   calcium carbonate 500 MG chewable tablet Commonly known as: TUMS - dosed in mg elemental calcium Chew 1 tablet (200 mg of elemental calcium total) by  mouth 2 (two) times daily with a meal.   cyanocobalamin 1000 MCG tablet Commonly known as: VITAMIN B12 Take 1,000 mcg by mouth daily.   ezetimibe 10 MG tablet Commonly known as: ZETIA Take 1 tablet (10 mg total) by mouth daily.   gabapentin 400 MG capsule Commonly known as: NEURONTIN TAKE 1 CAPSULE BY MOUTH 3 TIMES DAILY   metoprolol tartrate 25 MG tablet Commonly known as: LOPRESSOR TAKE 1/2 TABLET BY MOUTH TWICE DAILY.   naloxone 4 MG/0.1ML Liqd nasal spray  kit Commonly known as: NARCAN 4 or 8 mg (1 spray) intranasally every 2 to 3 minutes in alternating nostrils as needed. Seek immediate medical assistance after administration of the first dose.   oxybutynin 10 MG 24 hr tablet Commonly known as: DITROPAN-XL Take 1 tablet (10 mg total) by mouth at bedtime.   pantoprazole 40 MG tablet Commonly known as: PROTONIX TAKE 1 TABLET BY MOUTH ONCE DAILY   RISA-BID PROBIOTIC PO Take 250 mg by mouth daily.   rosuvastatin 10 MG tablet Commonly known as: CRESTOR TAKE 1 TABLET BY MOUTH AT BEDTIME   sertraline 50 MG tablet Commonly known as: ZOLOFT Take 2 tablets (100 mg total) by mouth daily.   Sleep Aid 25 MG tablet Generic drug: doxylamine (Sleep) Take 25 mg by mouth at bedtime as needed.   Vitamin D3 25 MCG (1000 UT) Caps Take 1,000 Units by mouth daily.   Xtampza ER 9 MG C12a Generic drug: oxyCODONE ER Take 1 capsule by mouth 2 (two) times daily.        Allergies: No Known Allergies  Family History: Family History  Problem Relation Age of Onset   Heart disease Father     Social History:  reports that she quit smoking about 13 years ago. Her smoking use included cigarettes. She started smoking about 57 years ago. She has a 44 pack-year smoking history. She has been exposed to tobacco smoke. She has never used smokeless tobacco. She reports current alcohol use of about 1.0 standard drink of alcohol per week. She reports that she does not use drugs.  ROS:                                         Physical Exam: BP 109/76   Pulse 75   Constitutional:  Alert and oriented, No acute distress. HEENT: [place] AT, moist mucus membranes.  Trachea midline, no masses.  Laboratory Data: Lab Results  Component Value Date   WBC 7.1 08/05/2022   HGB 14.6 08/05/2022   HCT 44.1 08/05/2022   MCV 96.7 08/05/2022   PLT 213 08/05/2022    Lab Results  Component Value Date   CREATININE 1.04 10/12/2022    No results found for: "PSA"  No results found for: "TESTOSTERONE"  Lab Results  Component Value Date   HGBA1C 5.6 01/14/2023    Urinalysis    Component Value Date/Time   COLORURINE YELLOW 10/13/2021 1436   APPEARANCEUR Hazy (A) 01/12/2022 1452   LABSPEC 1.015 10/13/2021 1436   LABSPEC 1.003 06/27/2011 1253   PHURINE 5.5 10/13/2021 1436   GLUCOSEU Negative 01/12/2022 1452   GLUCOSEU NEGATIVE 10/13/2021 1436   HGBUR NEGATIVE 10/13/2021 1436   BILIRUBINUR Negative 01/12/2022 1452   BILIRUBINUR Negative 06/27/2011 1253   KETONESUR NEGATIVE 10/13/2021 1436   PROTEINUR Negative 01/12/2022 1452   PROTEINUR NEGATIVE 11/07/2020 1619   UROBILINOGEN 0.2 10/13/2021 1436   NITRITE Positive (A) 01/12/2022 1452   NITRITE NEGATIVE 10/13/2021 1436   LEUKOCYTESUR 2+ (A) 01/12/2022 1452   LEUKOCYTESUR MODERATE (A) 10/13/2021 1436   LEUKOCYTESUR Negative 06/27/2011 1253    Pertinent Imaging:   Assessment & Plan: I went over Botox and percutaneous nerve stimulation with full templates.  I do not think she is a good InterStim candidate and I did not discuss it.  I mention urodynamics but I do not think she wants to go to [place].  She would like  to start with percutaneous tibial nerve stimulation.  I have asked the nurses to get a urine culture when she comes in for them and perhaps intermittently throughout them.  If she is obviously getting positive cultures I would suggest putting her on urinary prophylaxis during the percutaneous  tibial nerve stimulation treatments.  If she fails PTNS she may consider Botox.  1. OAB (overactive bladder) (Primary)  - Urinalysis, Complete  2. Urinary urgency  - Urinalysis, Complete   No follow-ups on file.  Martina Sinner, MD  [facility] [address], [address] [place], [place] [address] [phone]

## 2023-05-18 ENCOUNTER — Telehealth: Payer: Self-pay | Admitting: Urology

## 2023-05-18 NOTE — Telephone Encounter (Signed)
Patient called and lvm stating that she saw Dr. Sherron Monday yesterday (05/17/23). She said that she didn't get any kind of explanation about her issue, and she would like to speak with someone, and requests a call.

## 2023-05-18 NOTE — Telephone Encounter (Signed)
Marland Kitchen  left message to have patient return my call.

## 2023-05-19 NOTE — Telephone Encounter (Signed)
Spoke with patient and advised how the PTNS verification works.

## 2023-05-26 ENCOUNTER — Telehealth: Payer: Self-pay

## 2023-05-26 NOTE — Telephone Encounter (Signed)
-----   Message from [facility] M sent at 05/17/2023 10:58 AM EST ----- Regarding: PTNS Pt will need PTNS scheduled. Dr. Ezzard Holms wants a UA and CUX on the first PTNS visit. Thanks!

## 2023-06-07 ENCOUNTER — Telehealth: Payer: Self-pay | Admitting: Urology

## 2023-06-07 NOTE — Telephone Encounter (Signed)
Spoke with patient and rescheduled for next week 

## 2023-06-07 NOTE — Telephone Encounter (Signed)
 Pt called asking about getting PTNS scheduled before the weather 2/19. PT requested if they could do urine sample today?

## 2023-06-07 NOTE — Telephone Encounter (Signed)
 Pt is unable to make it tomorrow due to transportation issues.  She said she could come on Wednesday.

## 2023-06-07 NOTE — Telephone Encounter (Signed)
 Patient advised that there is no PA, but co pay will be $20 each visit x 12 weeks and patient aware to give a UA sample for the first visit.

## 2023-06-07 NOTE — Telephone Encounter (Signed)
See other phone note in regards to this ?

## 2023-06-08 ENCOUNTER — Ambulatory Visit: Payer: Medicare HMO | Admitting: Urology

## 2023-06-14 NOTE — Progress Notes (Unsigned)
 PTNS  Session # 1/12  Health & Social Factors: No change Caffeine: 2 Alcohol: 0 Daytime voids #per day: 4 Night-time voids #per night: 2 Urgency: Mild Incontinence Episodes #per day: Soaks one depends daily Ankle used: Left  Treatment Setting: 4 Feeling/ Response: Sensory  Comments: Patient tolerated the procedure well.  Performed By: Michiel Cowboy, PA-C   Follow Up: One week for #2/10

## 2023-06-15 ENCOUNTER — Ambulatory Visit (INDEPENDENT_AMBULATORY_CARE_PROVIDER_SITE_OTHER): Payer: Medicare HMO | Admitting: Urology

## 2023-06-15 DIAGNOSIS — N3281 Overactive bladder: Secondary | ICD-10-CM

## 2023-06-15 LAB — MICROSCOPIC EXAMINATION: WBC, UA: >30 /HPF — AB (ref 0–5)

## 2023-06-15 LAB — URINALYSIS, COMPLETE
Bilirubin, UA: NEGATIVE
Glucose, UA: NEGATIVE
Nitrite, UA: NEGATIVE
Protein,UA: NEGATIVE
RBC, UA: NEGATIVE
Specific Gravity, UA: 1.025 (ref 1.005–1.030)
Urobilinogen, Ur: 0.2 mg/dL (ref 0.2–1.0)
pH, UA: 6 (ref 5.0–7.5)

## 2023-06-15 NOTE — Patient Instructions (Signed)

## 2023-06-15 NOTE — Addendum Note (Signed)
 Addended by: Levada Schilling on: 06/15/2023 11:02 AM   Modules accepted: Orders

## 2023-06-16 ENCOUNTER — Telehealth: Payer: Self-pay

## 2023-06-16 NOTE — Telephone Encounter (Signed)
 Patient states she had her first PTNS done yesterday 06/15/23, patient states was woken up in the middle of the night she was having severe pain where the needle was placed for PTNS, severe pain and her foot kept jerking all night and this continued all night long. She could not sleep. Symptoms stopped around 5 am and the pain subsided. She is concerned about this and wanted to know if this is normal and how she should proceed with her other PTNS appointments.

## 2023-06-17 NOTE — Telephone Encounter (Signed)
 Left detailed message for the patient with this information

## 2023-06-18 LAB — CULTURE, URINE COMPREHENSIVE

## 2023-06-18 MED ORDER — CEFUROXIME AXETIL 250 MG PO TABS: 250.0000 mg | ORAL_TABLET | Freq: Two times a day (BID) | ORAL | 0 refills | Status: DC

## 2023-06-18 NOTE — Telephone Encounter (Signed)
 Spoke with patient and confirmed she uses [facility]. Antibiotic sent in and patient aware

## 2023-06-21 NOTE — Progress Notes (Unsigned)
 PTNS  Session # 2/12  Health & Social Factors: No change Caffeine: 2 Alcohol: 0 Daytime voids #per day: 4 Night-time voids #per night: 2 Urgency: mild Incontinence Episodes #per day: Soaks 1 depends during the night Ankle used: Left Treatment Setting: 6 Feeling/ Response: Sensory Comments: Patient tolerated the procedure  Performed By: Michiel Cowboy, PA-C   Follow Up: 1 week for number 3 out of 12 PTNS

## 2023-06-22 ENCOUNTER — Ambulatory Visit (INDEPENDENT_AMBULATORY_CARE_PROVIDER_SITE_OTHER): Payer: Medicare HMO | Admitting: Urology

## 2023-06-22 DIAGNOSIS — N3281 Overactive bladder: Secondary | ICD-10-CM

## 2023-06-25 ENCOUNTER — Encounter (INDEPENDENT_AMBULATORY_CARE_PROVIDER_SITE_OTHER): Payer: Medicare HMO | Admitting: Ophthalmology

## 2023-06-25 DIAGNOSIS — H43813 Vitreous degeneration, bilateral: Secondary | ICD-10-CM

## 2023-06-25 DIAGNOSIS — H353231 Exudative age-related macular degeneration, bilateral, with active choroidal neovascularization: Secondary | ICD-10-CM

## 2023-06-25 DIAGNOSIS — H35033 Hypertensive retinopathy, bilateral: Secondary | ICD-10-CM | POA: Diagnosis not present

## 2023-06-25 DIAGNOSIS — I10 Essential (primary) hypertension: Secondary | ICD-10-CM | POA: Diagnosis not present

## 2023-06-29 ENCOUNTER — Ambulatory Visit: Payer: Medicare HMO | Admitting: Urology

## 2023-07-05 ENCOUNTER — Ambulatory Visit: Payer: Medicare HMO | Admitting: Urology

## 2023-07-05 NOTE — Progress Notes (Unsigned)
 PTNS  Session # 3/12  Health & Social Factors: No changes  Caffeine: 1 Alcohol: 0 Daytime voids #per day: 3 Night-time voids #per night: 1 Urgency: Mild Incontinence Episodes #per day: 2 Ankle used: Left  Treatment Setting: 3 Feeling/ Response: Toe flex and sensory Comments: Patient tolerated procedure  Performed By: Michiel Cowboy, PA-C   Follow Up: One week for # 4/12 PTNS

## 2023-07-06 ENCOUNTER — Ambulatory Visit (INDEPENDENT_AMBULATORY_CARE_PROVIDER_SITE_OTHER): Payer: Medicare HMO | Admitting: Urology

## 2023-07-06 DIAGNOSIS — N3281 Overactive bladder: Secondary | ICD-10-CM | POA: Diagnosis not present

## 2023-07-12 NOTE — Progress Notes (Unsigned)
 PTNS  Session # 4/12  Health & Social Factors: *** Caffeine: *** Alcohol: *** Daytime voids #per day: *** Night-time voids #per night: *** Urgency: *** Incontinence Episodes #per day: *** Ankle used: *** Treatment Setting: *** Feeling/ Response: *** Comments: ***  Performed By: ***  Follow Up: ***

## 2023-07-13 ENCOUNTER — Ambulatory Visit (INDEPENDENT_AMBULATORY_CARE_PROVIDER_SITE_OTHER): Payer: Medicare HMO | Admitting: Urology

## 2023-07-13 DIAGNOSIS — N3281 Overactive bladder: Secondary | ICD-10-CM

## 2023-07-13 NOTE — Patient Instructions (Signed)

## 2023-07-16 ENCOUNTER — Other Ambulatory Visit: Payer: Self-pay | Admitting: Family

## 2023-07-16 DIAGNOSIS — I10 Essential (primary) hypertension: Secondary | ICD-10-CM

## 2023-07-18 NOTE — Progress Notes (Unsigned)
 PTNS  Session # 5/12  Health & Social Factors: No Change Caffeine: 0 Alcohol: 0 Daytime voids #per day: 2-3 Night-time voids #per night: 1 Urgency: strong Incontinence Episodes #per day: 1 Ankle used: left Treatment Setting: 9 Feeling/ Response: Sensory  Comments: Patient tolerated.   Performed By: Michiel Cowboy, PA-C   Follow Up: One week for #6/12

## 2023-07-20 ENCOUNTER — Ambulatory Visit (INDEPENDENT_AMBULATORY_CARE_PROVIDER_SITE_OTHER): Payer: Medicare HMO | Admitting: Urology

## 2023-07-20 DIAGNOSIS — N3941 Urge incontinence: Secondary | ICD-10-CM | POA: Diagnosis not present

## 2023-07-20 DIAGNOSIS — N3281 Overactive bladder: Secondary | ICD-10-CM | POA: Diagnosis not present

## 2023-07-20 NOTE — Patient Instructions (Signed)

## 2023-07-26 NOTE — Progress Notes (Unsigned)
 PTNS  Session # 6/12  Health & Social Factors: No change Caffeine: 1 Alcohol: 0 Daytime voids #per day: 2-3 Night-time voids #per night: 0 Urgency: severe Incontinence Episodes #per day: 2 Ankle used: Left Treatment Setting: 15 Feeling/ Response: Flex toe Comments: N/a  Performed By: Ples Specter CMA   Follow Up: One week

## 2023-07-27 ENCOUNTER — Ambulatory Visit (INDEPENDENT_AMBULATORY_CARE_PROVIDER_SITE_OTHER): Payer: Medicare HMO | Admitting: Urology

## 2023-07-27 VITALS — Wt 112.0 lb

## 2023-07-27 DIAGNOSIS — N3941 Urge incontinence: Secondary | ICD-10-CM

## 2023-07-27 DIAGNOSIS — R3915 Urgency of urination: Secondary | ICD-10-CM

## 2023-07-27 DIAGNOSIS — N3281 Overactive bladder: Secondary | ICD-10-CM

## 2023-07-27 NOTE — Patient Instructions (Signed)

## 2023-08-03 ENCOUNTER — Ambulatory Visit (INDEPENDENT_AMBULATORY_CARE_PROVIDER_SITE_OTHER): Payer: Medicare HMO | Admitting: Physician Assistant

## 2023-08-03 DIAGNOSIS — N3281 Overactive bladder: Secondary | ICD-10-CM

## 2023-08-03 NOTE — Patient Instructions (Signed)

## 2023-08-03 NOTE — Progress Notes (Signed)
 PTNS   Session # /12   Health & Social Factors: No change Caffeine: 1 Alcohol: 0 Daytime voids #per day: 2-3 Night-time voids #per night: 0 Urgency: severe Incontinence Episodes #per day: 2 Ankle used: Left Treatment Setting: 5 Feeling/ Response: Flex toe Comments: N/a   Performed By: Theodosia Fishman CMA    Follow Up: One week

## 2023-08-08 NOTE — Progress Notes (Deleted)
 NO SHOW

## 2023-08-09 ENCOUNTER — Ambulatory Visit: Payer: Medicare HMO | Attending: Cardiovascular Disease | Admitting: Cardiovascular Disease

## 2023-08-09 DIAGNOSIS — I25118 Atherosclerotic heart disease of native coronary artery with other forms of angina pectoris: Secondary | ICD-10-CM

## 2023-08-09 DIAGNOSIS — F419 Anxiety disorder, unspecified: Secondary | ICD-10-CM

## 2023-08-09 DIAGNOSIS — I658 Occlusion and stenosis of other precerebral arteries: Secondary | ICD-10-CM

## 2023-08-09 DIAGNOSIS — I739 Peripheral vascular disease, unspecified: Secondary | ICD-10-CM

## 2023-08-09 DIAGNOSIS — I10 Essential (primary) hypertension: Secondary | ICD-10-CM

## 2023-08-09 DIAGNOSIS — E782 Mixed hyperlipidemia: Secondary | ICD-10-CM

## 2023-08-10 ENCOUNTER — Ambulatory Visit: Payer: Medicare HMO | Admitting: Physician Assistant

## 2023-08-12 ENCOUNTER — Encounter: Payer: Self-pay | Admitting: Physician Assistant

## 2023-08-12 ENCOUNTER — Ambulatory Visit: Admitting: Family Medicine

## 2023-08-17 ENCOUNTER — Ambulatory Visit (INDEPENDENT_AMBULATORY_CARE_PROVIDER_SITE_OTHER): Payer: Medicare HMO | Admitting: Urology

## 2023-08-17 DIAGNOSIS — N3281 Overactive bladder: Secondary | ICD-10-CM

## 2023-08-17 NOTE — Progress Notes (Signed)
 PTNS  Session # 9  Health & Social Factors: No change Caffeine: 1-2 Alcohol : 0 Daytime voids #per day: 2-3 Night-time voids #per night: 2 Urgency: strong Incontinence Episodes #per day: 2 Ankle used: left Treatment Setting: 6 Feeling/ Response: Sensory Comments: Patient tolerated procedure  Performed By: Matilde Son, PA-C   Follow Up: 1 week for number 10 out of 12 PTNS

## 2023-08-24 ENCOUNTER — Ambulatory Visit: Payer: Medicare HMO | Admitting: Urology

## 2023-08-31 ENCOUNTER — Ambulatory Visit: Payer: Medicare HMO | Admitting: Urology

## 2023-09-02 ENCOUNTER — Encounter: Payer: Self-pay | Admitting: Family

## 2023-09-02 ENCOUNTER — Ambulatory Visit (INDEPENDENT_AMBULATORY_CARE_PROVIDER_SITE_OTHER): Admitting: Family

## 2023-09-02 VITALS — BP 118/78 | HR 94 | Temp 97.6°F | Ht 60.0 in | Wt 117.4 lb

## 2023-09-02 DIAGNOSIS — G894 Chronic pain syndrome: Secondary | ICD-10-CM | POA: Diagnosis not present

## 2023-09-02 DIAGNOSIS — R413 Other amnesia: Secondary | ICD-10-CM | POA: Diagnosis not present

## 2023-09-02 DIAGNOSIS — G629 Polyneuropathy, unspecified: Secondary | ICD-10-CM | POA: Diagnosis not present

## 2023-09-02 DIAGNOSIS — F32A Depression, unspecified: Secondary | ICD-10-CM

## 2023-09-02 DIAGNOSIS — F419 Anxiety disorder, unspecified: Secondary | ICD-10-CM

## 2023-09-02 DIAGNOSIS — Z1211 Encounter for screening for malignant neoplasm of colon: Secondary | ICD-10-CM

## 2023-09-02 LAB — CBC WITH DIFFERENTIAL/PLATELET
Basophils Absolute: 0.1 10*3/uL (ref 0.0–0.1)
Basophils Relative: 1.9 % (ref 0.0–3.0)
Eosinophils Absolute: 0.4 10*3/uL (ref 0.0–0.7)
Eosinophils Relative: 7.8 % — ABNORMAL HIGH (ref 0.0–5.0)
HCT: 42.7 % (ref 36.0–46.0)
Hemoglobin: 14.3 g/dL (ref 12.0–15.0)
Lymphocytes Relative: 23.8 % (ref 12.0–46.0)
Lymphs Abs: 1.3 10*3/uL (ref 0.7–4.0)
MCHC: 33.4 g/dL (ref 30.0–36.0)
MCV: 94 fl (ref 78.0–100.0)
Monocytes Absolute: 0.7 10*3/uL (ref 0.1–1.0)
Monocytes Relative: 12.7 % — ABNORMAL HIGH (ref 3.0–12.0)
Neutro Abs: 2.9 10*3/uL (ref 1.4–7.7)
Neutrophils Relative %: 53.8 % (ref 43.0–77.0)
Platelets: 230 10*3/uL (ref 150.0–400.0)
RBC: 4.54 Mil/uL (ref 3.87–5.11)
RDW: 14.6 % (ref 11.5–15.5)
WBC: 5.3 10*3/uL (ref 4.0–10.5)

## 2023-09-02 LAB — URINALYSIS, ROUTINE W REFLEX MICROSCOPIC
Bilirubin Urine: NEGATIVE
Hgb urine dipstick: NEGATIVE
Ketones, ur: NEGATIVE
Leukocytes,Ua: NEGATIVE
Nitrite: NEGATIVE
RBC / HPF: NONE SEEN (ref 0–?)
Specific Gravity, Urine: <=1.005 — AB (ref 1.000–1.030)
Total Protein, Urine: NEGATIVE
Urine Glucose: NEGATIVE
Urobilinogen, UA: 0.2 (ref 0.0–1.0)
pH: 6.5 (ref 5.0–8.0)

## 2023-09-02 LAB — LIPID PANEL
Cholesterol: 216 mg/dL — ABNORMAL HIGH (ref 0–200)
HDL: 70.8 mg/dL (ref >39.00)
LDL Cholesterol: 129 mg/dL — ABNORMAL HIGH (ref 0–99)
NonHDL: 144.71
Total CHOL/HDL Ratio: 3
Triglycerides: 78 mg/dL (ref 0.0–149.0)
VLDL: 15.6 mg/dL (ref 0.0–40.0)

## 2023-09-02 LAB — B12 AND FOLATE PANEL
Folate: 3.3 ng/mL — ABNORMAL LOW (ref >5.9)
Vitamin B-12: 175 pg/mL — ABNORMAL LOW (ref 211–911)

## 2023-09-02 LAB — COMPREHENSIVE METABOLIC PANEL WITH GFR
ALT: 11 U/L (ref 0–35)
AST: 20 U/L (ref 0–37)
Albumin: 3.9 g/dL (ref 3.5–5.2)
Alkaline Phosphatase: 69 U/L (ref 39–117)
BUN: 7 mg/dL (ref 6–23)
CO2: 25 meq/L (ref 19–32)
Calcium: 9.6 mg/dL (ref 8.4–10.5)
Chloride: 103 meq/L (ref 96–112)
Creatinine, Ser: 0.93 mg/dL (ref 0.40–1.20)
GFR: 60.09 mL/min (ref >60.00)
Glucose, Bld: 107 mg/dL — ABNORMAL HIGH (ref 70–99)
Potassium: 3.9 meq/L (ref 3.5–5.1)
Sodium: 138 meq/L (ref 135–145)
Total Bilirubin: 0.5 mg/dL (ref 0.2–1.2)
Total Protein: 6.5 g/dL (ref 6.0–8.3)

## 2023-09-02 LAB — VITAMIN D 25 HYDROXY (VIT D DEFICIENCY, FRACTURES): VITD: 61.36 ng/mL (ref 30.00–100.00)

## 2023-09-02 LAB — TSH: TSH: 1.84 u[IU]/mL (ref 0.35–5.50)

## 2023-09-02 NOTE — Progress Notes (Signed)
 Assessment & Plan:   Memory changes Assessment & Plan: Reassuring neurologic exam.  Mini-Mental status exam 30 out of 30.   Chronic lower extremity weakness complicated by bilateral neuropathy, deconditioning.  Concern for polypharmacy in regards to anticholinergic medication ( oxybutynin ), gabapentin , oxycodone . Will discuss at follow up medications that we can reduce.    Pending MRI brain, labs.  Discussed social engagement provide information for [facility] and [employer].  Referral to home health for PT, OT, RN services. Advise pill box. Close follow up.   Orders: -     B12 and Folate Panel -     RPR -     TSH -     Urinalysis, Routine w reflex microscopic -     MR BRAIN W WO CONTRAST; Future -     CBC with Differential/Platelet -     Comprehensive metabolic panel with GFR -     VITAMIN D  25 Hydroxy (Vit-D Deficiency, Fractures) -     Lipid panel  Screening for colon cancer -     Ambulatory referral to Gastroenterology  Chronic pain syndrome -     Ambulatory referral to Home Health  Neuropathy Assessment & Plan: Chronic, unchanged. Lost to follow up with Dr Mason Sole after 2023 Consider return to neurology for completion of work up, NCS.    Anxiety and depression Assessment & Plan: Uncontrolled. Discussed depression as a relates to isolation.  Encouraged engaging with senior center. consider changing from Zoloft  to more activating medication such as Prozac or Zoloft .  Will obtain baseline EKG.  Will discuss at follow-up      Return precautions given.   Risks, benefits, and alternatives of the medications and treatment plan prescribed today were discussed, and patient expressed understanding.   Education regarding symptom management and diagnosis given to patient on AVS either electronically or printed.  Return in about 6 weeks (around 10/14/2023).  Bascom Bossier, FNP I have spent 40 minutes with a patient including precharting, exam, reviewing medical  records, and discussion plan of care.     Subjective:    Patient ID: [name], female    DOB: [birth year], 78 y.o.   MRN: [id number]  CC: [name] is a 78 y.o. female who presents today for follow up.   HPI: Accompanied by niece, Concha Deed  She complains of depression over the last few years.  Her loss of mobility, isolation has significantly contributing to this  She has noticed her memory is not as sharp; unsure of timeline of this over the course of months.No acute event.  She cannot recall specific event. Niece gives an example of ordering online items she doesn't use. She has more clear  memories of childhood.   She has niece who drives her and checks on her regularly; she also has a house cleaning service once weekly.    She is able to take a shower, manage ordering groceries( food delivery)  She is napping 2-3 hours during the day as she 'bored'. She has trouble staying asleep.   She doesn't use a pill box. She 'thinks' that she is taking zoloft  100mg  daily.         Neurology consult for bilateral leg weakness 03/25/2022. No return to follow up. I do not see where NCS obtained ( mentioned in note)  Allergies: Patient has no known allergies. Current Outpatient Medications on File Prior to Visit  Medication Sig Dispense Refill   aspirin  81 MG EC tablet Take 81 mg by mouth  daily.       calcium  carbonate (TUMS - DOSED IN MG ELEMENTAL CALCIUM ) 500 MG chewable tablet Chew 1 tablet (200 mg of elemental calcium  total) by mouth 2 (two) times daily with a meal.     cefUROXime  (CEFTIN ) 250 MG tablet Take 1 tablet (250 mg total) by mouth 2 (two) times daily with a meal. 14 tablet 0   Cholecalciferol  (VITAMIN D3) 1000 UNITS CAPS Take 1,000 Units by mouth daily.     doxylamine , Sleep, (SLEEP AID) 25 MG tablet Take 25 mg by mouth at bedtime as needed.      ezetimibe  (ZETIA ) 10 MG tablet Take 1 tablet (10 mg total) by mouth daily. 90 tablet 3   gabapentin  (NEURONTIN ) 400 MG capsule  TAKE 1 CAPSULE BY MOUTH 3 TIMES DAILY 90 capsule 4   metoprolol  tartrate (LOPRESSOR ) 25 MG tablet TAKE 1/2 TABLET BY MOUTH TWICE DAILY. 60 tablet 1   naloxone  (NARCAN ) nasal spray 4 mg/0.1 mL 4 or 8 mg (1 spray) intranasally every 2 to 3 minutes in alternating nostrils as needed. Seek immediate medical assistance after administration of the first dose. 2 each 2   oxybutynin  (DITROPAN -XL) 10 MG 24 hr tablet Take 1 tablet (10 mg total) by mouth at bedtime. 30 tablet 11   pantoprazole  (PROTONIX ) 40 MG tablet TAKE 1 TABLET BY MOUTH ONCE DAILY 90 tablet 4   Probiotic Product (RISA-BID PROBIOTIC PO) Take 250 mg by mouth daily.      Pumpkin Seed-Soy Germ (AZO BLADDER CONTROL/GO-LESS) CAPS Take 1 tablet by mouth 3 (three) times daily.     rosuvastatin  (CRESTOR ) 10 MG tablet TAKE 1 TABLET BY MOUTH AT BEDTIME 90 tablet 3   sertraline  (ZOLOFT ) 50 MG tablet Take 2 tablets (100 mg total) by mouth daily. 180 tablet 3   vitamin B-12 (CYANOCOBALAMIN ) 1000 MCG tablet Take 1,000 mcg by mouth daily.     XTAMPZA  ER 9 MG C12A Take 1 capsule by mouth 2 (two) times daily.     No current facility-administered medications on file prior to visit.    Review of Systems  Constitutional:  Negative for chills and fever.  Respiratory:  Negative for cough.   Cardiovascular:  Negative for chest pain and palpitations.  Gastrointestinal:  Negative for nausea and vomiting.  Neurological:  Positive for numbness (BL legs).  Psychiatric/Behavioral:  Positive for sleep disturbance. The patient is not nervous/anxious.       Objective:    BP 118/78   Pulse 94   Temp 97.6 F (36.4 C) (Oral)   Ht 5' (1.524 m)   Wt 117 lb 6.4 oz (53.3 kg)   SpO2 95%   BMI 22.93 kg/m  BP Readings from Last 3 Encounters:  09/02/23 118/78  05/17/23 109/76  01/14/23 120/70   Wt Readings from Last 3 Encounters:  09/02/23 117 lb 6.4 oz (53.3 kg)  07/27/23 112 lb (50.8 kg)  01/14/23 112 lb (50.8 kg)      09/02/2023    8:07 AM 04/08/2022     3:49 PM 08/22/2021    2:48 PM  Depression screen PHQ 2/9  Decreased Interest 1 0 0  Down, Depressed, Hopeless 1 0 1  PHQ - 2 Score 2 0 1  Altered sleeping 0    Tired, decreased energy 2    Change in appetite 0    Feeling bad or failure about yourself  2    Trouble concentrating 0    Moving slowly or fidgety/restless 0    Suicidal  thoughts 0    PHQ-9 Score 6    Difficult doing work/chores Not difficult at all       Physical Exam Vitals reviewed.  Constitutional:      Appearance: She is well-developed.  HENT:     Mouth/Throat:     Pharynx: Uvula midline.  Eyes:     Conjunctiva/sclera: Conjunctivae normal.     Pupils: Pupils are equal, round, and reactive to light.     Comments: Fundus normal bilaterally.   Cardiovascular:     Rate and Rhythm: Normal rate and regular rhythm.     Pulses: Normal pulses.     Heart sounds: Normal heart sounds.  Pulmonary:     Effort: Pulmonary effort is normal.     Breath sounds: Normal breath sounds. No wheezing, rhonchi or rales.  Skin:    General: Skin is warm and dry.  Neurological:     Mental Status: She is alert.     Cranial Nerves: No cranial nerve deficit.     Sensory: No sensory deficit.     Coordination: Finger-Nose-Finger Test normal.     Deep Tendon Reflexes:     Reflex Scores:      Bicep reflexes are 2+ on the right side and 2+ on the left side.      Patellar reflexes are 2+ on the right side and 2+ on the left side.    Comments: Grip equal bilateral upper extremities. She is able to stand in front of wheelchair for a few seconds, unassisted. Unable to assess gait.  Finger to nose test without tremor 4/5 BLE strength  Psychiatric:        Speech: Speech normal.        Behavior: Behavior normal.        Thought Content: Thought content normal.

## 2023-09-02 NOTE — Assessment & Plan Note (Signed)
 Chronic, unchanged. Lost to follow up with Dr Mason Sole after 2023 Consider return to neurology for completion of work up, NCS.

## 2023-09-02 NOTE — Patient Instructions (Signed)
 Please limit naps to 30 minutes per day  Please look into senior center  Referral to home health  Review medication list

## 2023-09-02 NOTE — Assessment & Plan Note (Addendum)
 Reassuring neurologic exam.  Mini-Mental status exam 30 out of 30.   Chronic lower extremity weakness complicated by bilateral neuropathy, deconditioning.  Concern for polypharmacy in regards to anticholinergic medication ( oxybutynin ), gabapentin , oxycodone . Will discuss at follow up medications that we can reduce.    Pending MRI brain, labs.  Discussed social engagement provide information for [facility] and [employer].  Referral to home health for PT, OT, RN services. Advise pill box. Close follow up.

## 2023-09-02 NOTE — Assessment & Plan Note (Signed)
 Uncontrolled. Discussed depression as a relates to isolation.  Encouraged engaging with senior center. consider changing from Zoloft  to more activating medication such as Prozac or Zoloft .  Will obtain baseline EKG.  Will discuss at follow-up

## 2023-09-03 LAB — RPR: RPR Ser Ql: NONREACTIVE

## 2023-09-06 ENCOUNTER — Telehealth: Payer: Self-pay

## 2023-09-06 NOTE — Telephone Encounter (Signed)
 Copied from CRM [id number]. Topic: Clinical - Home Health Verbal Orders >> Sep 06, 2023 12:13 PM Eleanore Grey wrote: Mia Adam with [facility] just wanted to let the provider know they have finally gotten in contact with the patient and the patient will be starting physical therapy with their agency this Thursday, May 22nd for initial evaluation.

## 2023-09-06 NOTE — Telephone Encounter (Signed)
 Noted

## 2023-09-07 ENCOUNTER — Ambulatory Visit: Admitting: Urology

## 2023-09-10 ENCOUNTER — Ambulatory Visit: Payer: Self-pay | Admitting: Family

## 2023-09-14 ENCOUNTER — Ambulatory Visit (INDEPENDENT_AMBULATORY_CARE_PROVIDER_SITE_OTHER): Admitting: Urology

## 2023-09-14 ENCOUNTER — Ambulatory Visit: Admitting: Urology

## 2023-09-14 DIAGNOSIS — N3281 Overactive bladder: Secondary | ICD-10-CM | POA: Diagnosis not present

## 2023-09-14 NOTE — Progress Notes (Signed)
 PTNS  Session # 10  Health & Social Factors: No change Caffeine: 1 Alcohol : 0 Daytime voids #per day: 2-3 Night-time voids #per night: 0 Urgency: Strong Incontinence Episodes #per day: 2 Ankle used: Left Treatment Setting: 5 Feeling/ Response: Sensory  Comments: Patient tolerated  Performed By: Matilde Son, PA-C   Follow Up: We discussed that she had missed her last 4 PTNS treatments and she admitted to me that she has a difficult time with transportation.  Since this is a 12-week program and then monthly maintenance afterwards, we discussed that PTNS would likely not benefit her.  I encouraged her to keep her appointment with Dr. Arita Belch in June to discuss Botox

## 2023-09-16 ENCOUNTER — Encounter (INDEPENDENT_AMBULATORY_CARE_PROVIDER_SITE_OTHER): Admitting: Ophthalmology

## 2023-09-16 ENCOUNTER — Telehealth: Payer: Self-pay

## 2023-09-16 ENCOUNTER — Encounter: Payer: Self-pay | Admitting: Cardiovascular Disease

## 2023-09-16 NOTE — Telephone Encounter (Signed)
 Copied from CRM [id number]. Topic: Clinical - Home Health Verbal Orders >> Sep 16, 2023 12:19 PM Felizardo Hotter wrote: Caller/Agency: [facility] per Julious Ohms Callback Number: [phone] secure line Service Requested: Occupational Therapy Frequency: 1x 8 weeks Any new concerns about the patient? No

## 2023-09-17 ENCOUNTER — Encounter (INDEPENDENT_AMBULATORY_CARE_PROVIDER_SITE_OTHER): Admitting: Ophthalmology

## 2023-09-17 NOTE — Telephone Encounter (Signed)
 Jonella notified

## 2023-09-20 ENCOUNTER — Ambulatory Visit: Payer: Medicare HMO | Admitting: Urology

## 2023-09-24 ENCOUNTER — Telehealth: Payer: Self-pay

## 2023-09-24 NOTE — Telephone Encounter (Signed)
 Copied from CRM #[id number]. Topic: Appointments - Scheduling Inquiry for Clinic >> Sep 24, 2023  9:48 AM [name] wrote: Reason for CRM: Patient called in wanting to know if virtual visit that Dr. Eber Goldsmith is requesting could be done in 2 locations and was advised it cannot they would need to be in same location, notified niece with information and she stated only way she would be able to be apart of virtual visit is with an 430 appointment time. Please call [phone] to update.  I left a voicemail for patient's niece, [name], who is on patient's DPR, asking her to please call us  to schedule a virtual visit for patient with Bascom Bossier, FNP.  Note - at this time, Bascom Bossier, FNP's next available 4pm slot, which is the closest to the requested time of 4:30pm is on 11/04/2023.  E2C2 - when patient's niece, [name], calls back, please assist her with scheduling a virtual visit for patient with Bascom Bossier, FNP.

## 2023-09-24 NOTE — Telephone Encounter (Signed)
 Spoke to [place] @ [phone] regarding Ms [name] and she stated that she would like to speak to you personally before her appt on the 26th to give you some details about what is going on. (Dementia)?? Pt is not taking her cholesterol medication at all and other medication she is not taking either. Concha Deed is very concerned

## 2023-09-29 NOTE — Telephone Encounter (Signed)
 Spoke to [place], and she stated that she just wanted to let you know that pt is Not taking medication as she should, becoming angry when she questions her pt is drinking alcohol , and Concha Deed believes she has early onset of dementia, she stated that she can not join in on Video call for appt, but if you would like to speak to her or the therapist please feel free

## 2023-10-01 ENCOUNTER — Encounter (INDEPENDENT_AMBULATORY_CARE_PROVIDER_SITE_OTHER): Admitting: Ophthalmology

## 2023-10-01 DIAGNOSIS — H353231 Exudative age-related macular degeneration, bilateral, with active choroidal neovascularization: Secondary | ICD-10-CM | POA: Diagnosis not present

## 2023-10-01 DIAGNOSIS — H43813 Vitreous degeneration, bilateral: Secondary | ICD-10-CM

## 2023-10-01 DIAGNOSIS — I10 Essential (primary) hypertension: Secondary | ICD-10-CM

## 2023-10-01 DIAGNOSIS — H35033 Hypertensive retinopathy, bilateral: Secondary | ICD-10-CM

## 2023-10-11 ENCOUNTER — Ambulatory Visit: Admitting: Urology

## 2023-10-11 ENCOUNTER — Telehealth: Payer: Self-pay

## 2023-10-11 NOTE — Telephone Encounter (Signed)
 Copied from CRM [id number]. Topic: Clinical - Medical Advice >> Oct 11, 2023 11:26 AM Maisie BROCKS wrote: Reason for CRM: Dr. Darletta Bathe from [facility] called and stated that he has been managing the patient's narcotics for pain management. He has been trying to monitor but explained that they have been running into some issues with her falling short. He asked to speak with Rollene to set up at home skilled nursing. His office phone is [phone], cell: [phone]. Please advise.

## 2023-10-12 NOTE — Telephone Encounter (Signed)
 Referral team What is status of home health referral?      My notes Spoke with dr fernand on his cell phone regarding safety concerns for [name] , use of xtampza .  She was 6 pills short  She has an appt with me 10/14/22 and I plan discuss status of home health, safety at home, medication administration

## 2023-10-14 ENCOUNTER — Telehealth

## 2023-10-14 ENCOUNTER — Ambulatory Visit: Payer: Self-pay

## 2023-10-14 ENCOUNTER — Telehealth: Payer: Self-pay

## 2023-10-14 ENCOUNTER — Ambulatory Visit: Admitting: Family

## 2023-10-14 NOTE — Telephone Encounter (Signed)
 Noted  Please also call Dr. Darletta Bathe from [facility] and let him know that pt did not show for visit today.    [phone]

## 2023-10-14 NOTE — Telephone Encounter (Signed)
 Spoke to [place] and she stated that she is no longer involved per request of patient, she has become mean and hateful towards her so she is stepping away respectfully

## 2023-10-14 NOTE — Telephone Encounter (Addendum)
    First attempt; no answer Second attempt; no answer   Patient has been experiencing back pain for the last 2-3 weeks. Patient declined appointment due to not having transportation.

## 2023-10-14 NOTE — Telephone Encounter (Signed)
  FYI Only or Action Required?: FYI only for provider.  Patient was last seen in primary care on 09/02/2023 by Dineen Rollene MATSU, FNP. Called Nurse Triage reporting Back Pain. Symptoms began today. Interventions attempted: OTC medications: Aleeve. Symptoms are: gradually worsening.  Triage Disposition: See PCP When Office is Open (Within 3 Days)  Patient/caregiver understands and will follow disposition?: Yes   Has appt on 7/9 and is going to [facility]          Copied from CRM #[id number]. Topic: Clinical - Red Word Triage >> Oct 14, 2023  3:45 PM Ernestene P wrote: Red Word that prompted transfer to Nurse Triage:  bad back pain , down to shoulder/down to butt Reason for Disposition  [1] MODERATE back pain (e.g., interferes with normal activities) AND [2] present > 3 days  Answer Assessment - Initial Assessment Questions 1. ONSET: When did the pain begin?      Last night 2. LOCATION: Where does it hurt? (upper, mid or lower back)     Lower back 3. SEVERITY: How bad is the pain?  (e.g., Scale 1-10; mild, moderate, or severe)   - MILD (1-3): Doesn't interfere with normal activities.    - MODERATE (4-7): Interferes with normal activities or awakens from sleep.    - SEVERE (8-10): Excruciating pain, unable to do any normal activities.      moderate 4. PATTERN: Is the pain constant? (e.g., yes, no; constant, intermittent)      constant 5. RADIATION: Does the pain shoot into your legs or somewhere else?     denies 6. CAUSE:  What do you think is causing the back pain?      unknown 7. BACK OVERUSE:  Any recent lifting of heavy objects, strenuous work or exercise?     denies 8. MEDICINES: What have you taken so far for the pain? (e.g., nothing, acetaminophen , NSAIDS)     aleeve 9. NEUROLOGIC SYMPTOMS: Do you have any weakness, numbness, or problems with bowel/bladder control?     no 10. OTHER SYMPTOMS: Do you have any other symptoms? (e.g., fever, abdomen pain, burning  with urination, blood in urine)       no 11. PREGNANCY: Is there any chance you are pregnant? When was your last menstrual period?       no  Protocols used: Back Pain-A-AH

## 2023-10-14 NOTE — Telephone Encounter (Signed)
 FYI Only or Action Required?: Action required by provider: request for appointment.  Patient was last seen in primary care on 09/02/2023 by Dineen Rollene MATSU, FNP. Called Nurse Triage reporting Back Pain. Symptoms began today. Interventions attempted: OTC medications: tylenol , ibuprofen , aleve. Symptoms are: gradually worsening.  Triage Disposition: See PCP When Office is Open (Within 3 Days)  Patient/caregiver understands and will follow disposition?: Yes, will follow disposition  Copied from CRM [id number]. Topic: Clinical - Red Word Triage >> Oct 14, 2023  2:40 PM Leah C wrote: Red Word that prompted transfer to Nurse Triage: Mid and lower severe back pain Reason for Disposition  [1] MODERATE back pain (e.g., interferes with normal activities) AND [2] present > 3 days  Answer Assessment - Initial Assessment Questions 1. ONSET: When did the pain begin?      Intermittently for a ;long long time 2. LOCATION: Where does it hurt? (upper, mid or lower back)     Mid to lower back 3. SEVERITY: How bad is the pain?  (e.g., Scale 1-10; mild, moderate, or severe)   - MILD (1-3): Doesn't interfere with normal activities.    - MODERATE (4-7): Interferes with normal activities or awakens from sleep.    - SEVERE (8-10): Excruciating pain, unable to do any normal activities.      8 4. PATTERN: Is the pain constant? (e.g., yes, no; constant, intermittent)      Constant right now 5. RADIATION: Does the pain shoot into your legs or somewhere else?     denies 6. CAUSE:  What do you think is causing the back pain?      Unsure, old age 78. BACK OVERUSE:  Any recent lifting of heavy objects, strenuous work or exercise?     denies 8. MEDICINES: What have you taken so far for the pain? (e.g., nothing, acetaminophen , NSAIDS)     Nothing OTC helps 9. NEUROLOGIC SYMPTOMS: Do you have any weakness, numbness, or problems with bowel/bladder control?     denies 10. OTHER SYMPTOMS: Do you have  any other symptoms? (e.g., fever, abdomen pain, burning with urination, blood in urine)       denies  Protocols used: Back Pain-A-AH

## 2023-10-14 NOTE — Telephone Encounter (Signed)
 Pt called back - opened an new encounter - please see that encounter.

## 2023-10-14 NOTE — Telephone Encounter (Signed)
 LVM 2nd time for pt to call back to do MCVV

## 2023-10-14 NOTE — Telephone Encounter (Signed)
 Pt has been read message and is going to [facility] and has scheduled appt in office

## 2023-10-14 NOTE — Telephone Encounter (Signed)
 Please see nurse triage from [facility] today as well, patient called back in . Duplicate triage.    FYI Only or Action Required?: Action required by provider: update on patient condition.  Patient was last seen in primary care on 09/02/2023 by Dineen Rollene MATSU, FNP. Called Nurse Triage reporting Back Pain. Symptoms began several days ago. Interventions attempted: OTC medications: Aleve and Ice/heat application. Symptoms are: upper and lower back pain gradually worsening.  Triage Disposition: See HCP Within 4 Hours (Or PCP Triage)- Refused  Patient/caregiver understands and will follow disposition?: No                          Patient/caregiver understands and will follow disposition?:   Copied from CRM [id number]. Topic: Clinical - Red Word Triage >> Oct 14, 2023 12:54 PM Gennette ORN wrote: Red Word that prompted transfer to Nurse Triage: Patient is have trouble breathing and also severe back pain closer down below to her butt. Reason for Disposition  [1] SEVERE back pain (e.g., excruciating, unable to do any normal activities) AND [2] not improved 2 hours after pain medicine  Answer Assessment - Initial Assessment Questions Patient states she has no transportation. Called [facility] and confirmed with Darice, no available appointments until July 7th. Patient unable to go to mobile medicine clinic or urgent care. Offered telehealth visit and patient states she can not log into her mychart. Assisted patient with mychart web address, log in information, sent password reset link and walked patient thru virtual visit. 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 She refuses to call 911 or g to to ED and states she has no mode of transportation.    1. ONSET: When did the pain begin?      X couple days.  2. LOCATION: Where does it hurt? (upper, mid or lower back)     Upper between shoulder blades and lower  back.  3. SEVERITY: How bad is the pain?  (e.g., Scale 1-10; mild, moderate, or severe)   - MILD (1-3): Doesn't interfere with normal activities.    - MODERATE (4-7): Interferes with normal activities or awakens from sleep.    - SEVERE (8-10): Excruciating pain, unable to do any normal activities.      8/10.  4. PATTERN: Is the pain constant? (e.g., yes, no; constant, intermittent)      Constant.  5. RADIATION: Does the pain shoot into your legs or somewhere else?     No.  6. CAUSE:  What do you think is causing the back pain?      She states this happens on occasion and it just flares us .  7. BACK OVERUSE:  Any recent lifting of heavy objects, strenuous work or exercise?     No.  8. MEDICINES: What have you taken so far for the pain? (e.g., nothing, acetaminophen , NSAIDS)     Aleve, heating pad, ice.  9. NEUROLOGIC SYMPTOMS: Do you have any weakness, numbness, or problems with bowel/bladder control?     Patient denies.  10. OTHER SYMPTOMS: Do you have any other symptoms? (e.g., fever, abdomen pain, burning with urination, blood in urine)       No.  11. PREGNANCY: Is there any chance you are pregnant? When was your last menstrual period?       N/A.  Protocols used: Back Pain-A-AH

## 2023-10-14 NOTE — Telephone Encounter (Signed)
 FYI Only or Action Required?: Action required by provider: request for appointment and Pt unable to come into office. Pt unable to have a MyChart vv.  Patient was last seen in primary care on 09/02/2023 by Dineen Rollene MATSU, FNP. Called Nurse Triage reporting Back Pain. Symptoms began yesterday. Interventions attempted: OTC medications: Aleve. Symptoms are: gradually worsening.  Triage Disposition: See HCP Within 4 Hours (Or PCP Triage)  Patient/caregiver understands and will follow disposition?: Unsure            Copied from CRM [id number]. Topic: Clinical - Red Word Triage >> Oct 14, 2023 12:42 PM Avram MATSU wrote: Red Word that prompted transfer to Nurse Triage: chronic back pain   ----------------------------------------------------------------------- From previous Reason for Contact - Scheduling: Patient/patient representative is calling to schedule an appointment. Refer to attachments for appointment information. Reason for Disposition  [1] SEVERE back pain (e.g., excruciating, unable to do any normal activities) AND [2] not improved 2 hours after pain medicine  Answer Assessment - Initial Assessment Questions 1. ONSET: When did the pain begin?      Ongoing - flared up yesterday 2. LOCATION: Where does it hurt? (upper, mid or lower back)     Lower back 3. SEVERITY: How bad is the pain?  (e.g., Scale 1-10; mild, moderate, or severe)   - MILD (1-3): Doesn't interfere with normal activities.    - MODERATE (4-7): Interferes with normal activities or awakens from sleep.    - SEVERE (8-10): Excruciating pain, unable to do any normal activities.      Lower back and between  shoulder blades 4. PATTERN: Is the pain constant? (e.g., yes, no; constant, intermittent)      constant 5. RADIATION: Does the pain shoot into your legs or somewhere else?     no 6. CAUSE:  What do you think is causing the back pain?      weather 7. BACK OVERUSE:  Any recent lifting of heavy  objects, strenuous work or exercise?     no 8. MEDICINES: What have you taken so far for the pain? (e.g., nothing, acetaminophen , NSAIDS)     No - aleve 9. NEUROLOGIC SYMPTOMS: Do you have any weakness, numbness, or problems with bowel/bladder control?     no 10. OTHER SYMPTOMS: Do you have any other symptoms? (e.g., fever, abdomen pain, burning with urination, blood in urine)       no  Protocols used: Back Pain-A-AH

## 2023-10-14 NOTE — Telephone Encounter (Signed)
 See other note Advised EMS

## 2023-10-14 NOTE — Telephone Encounter (Signed)
 Lvm to call back to office  to relay message below per Rollene!! Please relay and advise accordingly.   I advise pt to call EMS ; she needs to be evaluated in person   Noted she refused prior; please try again   Please ask if she would like social work and home health referral for resources. Concerned with her overall safety, isolation!!!!!

## 2023-10-14 NOTE — Telephone Encounter (Signed)
 Please call Mliss let her know unable to reach pt today for VV  Please also call Dr. Darletta Bathe from [facility] and let him know that pt did not show for visit today.   [phone]

## 2023-10-15 ENCOUNTER — Telehealth: Payer: Self-pay

## 2023-10-15 ENCOUNTER — Other Ambulatory Visit: Payer: Self-pay | Admitting: Family

## 2023-10-15 NOTE — Telephone Encounter (Signed)
 Called Dr. Darletta Bathe from [facility] @ [phone] to inform them that pt missed appt she had today, 10/14/23 but had to LVM for someone to give me  a call back

## 2023-10-15 NOTE — Telephone Encounter (Signed)
 Agree with disposition ,  [facility] for in person evaluation She may keep appt with Dr Narendra as well for next week

## 2023-10-15 NOTE — Telephone Encounter (Signed)
 Spoke to Lyfe Reihl from [facility], she wanted to inform us  of pt interaction on 10/14/23, (see note below) she needed verbal orders to go out next week to try and do PT an see if pt would allow her to come out and assist her because pt had a gentleman come out and refused to let him work with her. Chi Woodham  stated that she will let us  know after she goes out if she was able to make any headway

## 2023-10-15 NOTE — Telephone Encounter (Signed)
 Copied from CRM [id number]. Topic: General - Call Back - No Documentation >> Oct 14, 2023  5:31 PM [name] wrote: Reason for CRM: [name], [facility], [phone] called in concerned about patient's cognitive skills, safety with her living alone, and concerns with alcohol . She stated she was unable to reach the patient until she sent a text stating that she was going to call authorities for a welfare check. She stated the patient said that she doesn't need physical therapy and wants to be discharged from it. [name] stated that she does not work tomorrow but would like a call back to speak with provider/nurse about patient concerns.

## 2023-10-18 NOTE — Telephone Encounter (Signed)
 Called [facility] @ [phone], but had to leave a  Vm for someone to give me a call back.

## 2023-10-20 ENCOUNTER — Telehealth: Payer: Self-pay

## 2023-10-21 ENCOUNTER — Telehealth: Payer: Self-pay | Admitting: Family

## 2023-10-21 NOTE — Telephone Encounter (Signed)
 Copied from CRM [id number]. Topic: General - Deceased Patient >> 25-Oct-2023 12:09 PM Suzen RAMAN wrote: Name of caller: Shona Parius([facility])  Date of death: 10-24-2023   Name of funeral home: Unknown  Phone number of funeral home: Unknown  Provider that needs to sign form: N/A  Timeline for signing: N/A  Patient Daughter called and complete a wellness check on patient and discover patient was deceased Oct 24, 2023.

## 2023-10-21 NOTE — Telephone Encounter (Signed)
 Spoke with [employer]. Notified her pt missed previous appointment with Rollene and is now scheduled to see Dr. Onesimo on 07/07. Tawni stated she would relay the message to Dr. Fernand.

## 2023-10-25 ENCOUNTER — Ambulatory Visit: Admitting: Internal Medicine

## 2023-10-25 NOTE — Telephone Encounter (Unsigned)
 Copied from CRM [id number]. Topic: General - Other >> Oct 25, 2023  3:58 PM Ernestene P wrote: Reason for CRM: Pt niece Mliss called to advise funeral home has fax over death certificate and need pcp signature for cremation , pt niece can be reached [phone]

## 2023-10-26 NOTE — Telephone Encounter (Unsigned)
 Copied from CRM [id number]. Topic: General - Deceased Patient >> 11-13-2023  2:21 PM Burnard DEL wrote: Name of caller: [employer] [facility]  Date of death: 11/07/2023    Provider that needs to sign form: Rollene [place]  Timeline for signing: Provider is asked  to go into the [place] website to sign death certificate for patient. Funeral home stated that it is past the 3 day business window for providers to sign death certificate for patient.She would like to know if it could be signed as soon as possible.

## 2023-10-26 NOTE — Telephone Encounter (Signed)
 Called pt's niece to find out what funeral home they were using because Rollene, NP had not received the death certificate yet. Found out the name was a little different on the death certificate than what we have in the chart. With the correct name Rollene, NP was able to pull up the the death certificate and complete it.

## 2023-10-27 NOTE — Telephone Encounter (Signed)
 Spoke to [facility] informed her that you had signed death certificate and if she needed anything else from us  to please feel free to reach out

## 2023-11-19 NOTE — Telephone Encounter (Signed)
 Copied from CRM [id number]. Topic: General - Call Back - No Documentation >> Oct 20, 2023  2:38 PM Robinson H wrote: Reason for CRM: [facility] states she has a message from [place] to return call regarding patient, no notes  [facility] [phone]

## 2023-11-19 DEATH — deceased

## 2023-12-31 ENCOUNTER — Encounter (INDEPENDENT_AMBULATORY_CARE_PROVIDER_SITE_OTHER): Admitting: Ophthalmology
